# Patient Record
Sex: Male | Born: 1945 | Race: White | Hispanic: No | Marital: Married | Smoking: Current every day smoker
Health system: Southern US, Community
[De-identification: ages and names within clinical notes are randomized; demographics above are authoritative.]

## PROBLEM LIST (undated history)

## (undated) DIAGNOSIS — G8929 Other chronic pain: Secondary | ICD-10-CM

## (undated) DIAGNOSIS — K219 Gastro-esophageal reflux disease without esophagitis: Secondary | ICD-10-CM

## (undated) DIAGNOSIS — Z7901 Long term (current) use of anticoagulants: Secondary | ICD-10-CM

## (undated) DIAGNOSIS — Z923 Personal history of irradiation: Secondary | ICD-10-CM

## (undated) DIAGNOSIS — Y842 Radiological procedure and radiotherapy as the cause of abnormal reaction of the patient, or of later complication, without mention of misadventure at the time of the procedure: Secondary | ICD-10-CM

## (undated) DIAGNOSIS — C7951 Secondary malignant neoplasm of bone: Secondary | ICD-10-CM

## (undated) DIAGNOSIS — Z9221 Personal history of antineoplastic chemotherapy: Secondary | ICD-10-CM

## (undated) DIAGNOSIS — F329 Major depressive disorder, single episode, unspecified: Secondary | ICD-10-CM

## (undated) DIAGNOSIS — I10 Essential (primary) hypertension: Secondary | ICD-10-CM

## (undated) DIAGNOSIS — J439 Emphysema, unspecified: Secondary | ICD-10-CM

## (undated) DIAGNOSIS — C61 Malignant neoplasm of prostate: Secondary | ICD-10-CM

## (undated) DIAGNOSIS — Z85828 Personal history of other malignant neoplasm of skin: Secondary | ICD-10-CM

## (undated) DIAGNOSIS — E785 Hyperlipidemia, unspecified: Secondary | ICD-10-CM

## (undated) DIAGNOSIS — G510 Bell's palsy: Secondary | ICD-10-CM

## (undated) DIAGNOSIS — Z87442 Personal history of urinary calculi: Secondary | ICD-10-CM

## (undated) DIAGNOSIS — J069 Acute upper respiratory infection, unspecified: Secondary | ICD-10-CM

## (undated) DIAGNOSIS — C679 Malignant neoplasm of bladder, unspecified: Secondary | ICD-10-CM

## (undated) DIAGNOSIS — H60312 Diffuse otitis externa, left ear: Secondary | ICD-10-CM

## (undated) DIAGNOSIS — I2510 Atherosclerotic heart disease of native coronary artery without angina pectoris: Secondary | ICD-10-CM

## (undated) DIAGNOSIS — M8738 Other secondary osteonecrosis, other site: Secondary | ICD-10-CM

## (undated) DIAGNOSIS — I219 Acute myocardial infarction, unspecified: Secondary | ICD-10-CM

## (undated) DIAGNOSIS — T7840XA Allergy, unspecified, initial encounter: Secondary | ICD-10-CM

## (undated) DIAGNOSIS — Z5112 Encounter for antineoplastic immunotherapy: Secondary | ICD-10-CM

## (undated) DIAGNOSIS — C4490 Unspecified malignant neoplasm of skin, unspecified: Secondary | ICD-10-CM

## (undated) DIAGNOSIS — F32A Depression, unspecified: Secondary | ICD-10-CM

## (undated) DIAGNOSIS — Z8619 Personal history of other infectious and parasitic diseases: Secondary | ICD-10-CM

## (undated) DIAGNOSIS — C3490 Malignant neoplasm of unspecified part of unspecified bronchus or lung: Secondary | ICD-10-CM

## (undated) DIAGNOSIS — C07 Malignant neoplasm of parotid gland: Secondary | ICD-10-CM

## (undated) HISTORY — DX: Gastro-esophageal reflux disease without esophagitis: K21.9

## (undated) HISTORY — DX: Allergy, unspecified, initial encounter: T78.40XA

## (undated) HISTORY — PX: OTHER SURGICAL HISTORY: SHX169

## (undated) HISTORY — PX: EYE SURGERY: SHX253

## (undated) HISTORY — DX: Atherosclerotic heart disease of native coronary artery without angina pectoris: I25.10

## (undated) HISTORY — DX: Hyperlipidemia, unspecified: E78.5

## (undated) HISTORY — PX: EXCISIONAL HEMORRHOIDECTOMY: SHX1541

## (undated) HISTORY — DX: Personal history of other infectious and parasitic diseases: Z86.19

## (undated) HISTORY — PX: FOOT NEUROMA SURGERY: SHX646

## (undated) HISTORY — DX: Depression, unspecified: F32.A

## (undated) HISTORY — PX: SKIN CANCER EXCISION: SHX779

---
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
## 2002-03-14 ENCOUNTER — Encounter (HOSPITAL_BASED_OUTPATIENT_CLINIC_OR_DEPARTMENT_OTHER): Payer: Self-pay | Admitting: General Surgery

## 2002-03-15 ENCOUNTER — Ambulatory Visit (HOSPITAL_COMMUNITY): Admission: RE | Admit: 2002-03-15 | Discharge: 2002-03-15 | Payer: Self-pay | Admitting: General Surgery

## 2002-03-15 ENCOUNTER — Encounter (INDEPENDENT_AMBULATORY_CARE_PROVIDER_SITE_OTHER): Payer: Self-pay | Admitting: *Deleted

## 2002-03-15 HISTORY — PX: EXCISIONAL HEMORRHOIDECTOMY: SHX1541

## 2003-09-09 HISTORY — PX: FOOT NEUROMA SURGERY: SHX646

## 2005-07-25 ENCOUNTER — Encounter: Admission: RE | Admit: 2005-07-25 | Discharge: 2005-07-25 | Payer: Self-pay | Admitting: Family Medicine

## 2007-12-08 HISTORY — PX: CATARACT EXTRACTION W/ INTRAOCULAR LENS IMPLANT: SHX1309

## 2008-06-08 DIAGNOSIS — I219 Acute myocardial infarction, unspecified: Secondary | ICD-10-CM

## 2008-06-08 DIAGNOSIS — I252 Old myocardial infarction: Secondary | ICD-10-CM

## 2008-06-08 HISTORY — PX: CORONARY ANGIOPLASTY WITH STENT PLACEMENT: SHX49

## 2008-06-08 HISTORY — DX: Old myocardial infarction: I25.2

## 2008-06-08 HISTORY — DX: Acute myocardial infarction, unspecified: I21.9

## 2008-07-01 ENCOUNTER — Inpatient Hospital Stay (HOSPITAL_COMMUNITY): Admission: EM | Admit: 2008-07-01 | Discharge: 2008-07-05 | Payer: Self-pay | Admitting: Emergency Medicine

## 2008-07-03 HISTORY — PX: CORONARY ANGIOPLASTY WITH STENT PLACEMENT: SHX49

## 2008-07-11 HISTORY — PX: NM MYOCAR PERF WALL MOTION: HXRAD629

## 2008-07-25 ENCOUNTER — Encounter (HOSPITAL_COMMUNITY): Admission: RE | Admit: 2008-07-25 | Discharge: 2008-08-24 | Payer: Self-pay | Admitting: Family Medicine

## 2008-08-30 ENCOUNTER — Encounter (HOSPITAL_COMMUNITY): Admission: RE | Admit: 2008-08-30 | Discharge: 2008-09-29 | Payer: Self-pay | Admitting: Cardiovascular Disease

## 2008-09-08 HISTORY — PX: TRANSURETHRAL RESECTION OF BLADDER TUMOR WITH GYRUS (TURBT-GYRUS): SHX6458

## 2008-09-25 ENCOUNTER — Encounter (HOSPITAL_COMMUNITY): Admission: RE | Admit: 2008-09-25 | Discharge: 2008-10-25 | Payer: Self-pay | Admitting: Cardiovascular Disease

## 2008-10-09 DIAGNOSIS — Z8551 Personal history of malignant neoplasm of bladder: Secondary | ICD-10-CM

## 2008-10-09 DIAGNOSIS — C679 Malignant neoplasm of bladder, unspecified: Secondary | ICD-10-CM

## 2008-10-09 HISTORY — DX: Malignant neoplasm of bladder, unspecified: C67.9

## 2008-10-09 HISTORY — DX: Personal history of malignant neoplasm of bladder: Z85.51

## 2008-10-16 ENCOUNTER — Encounter (INDEPENDENT_AMBULATORY_CARE_PROVIDER_SITE_OTHER): Payer: Self-pay | Admitting: Urology

## 2008-10-16 ENCOUNTER — Ambulatory Visit (HOSPITAL_COMMUNITY): Admission: RE | Admit: 2008-10-16 | Discharge: 2008-10-16 | Payer: Self-pay | Admitting: Urology

## 2008-10-16 HISTORY — PX: TRANSURETHRAL RESECTION OF BLADDER TUMOR WITH GYRUS (TURBT-GYRUS): SHX6458

## 2008-10-17 ENCOUNTER — Emergency Department (HOSPITAL_COMMUNITY): Admission: EM | Admit: 2008-10-17 | Discharge: 2008-10-17 | Payer: Self-pay | Admitting: Emergency Medicine

## 2008-10-27 ENCOUNTER — Encounter (HOSPITAL_COMMUNITY): Admission: RE | Admit: 2008-10-27 | Discharge: 2008-11-26 | Payer: Self-pay | Admitting: Cardiovascular Disease

## 2009-10-11 ENCOUNTER — Encounter: Admission: RE | Admit: 2009-10-11 | Discharge: 2009-10-11 | Payer: Self-pay | Admitting: Otolaryngology

## 2009-11-02 ENCOUNTER — Ambulatory Visit (HOSPITAL_BASED_OUTPATIENT_CLINIC_OR_DEPARTMENT_OTHER): Admission: RE | Admit: 2009-11-02 | Discharge: 2009-11-03 | Payer: Self-pay | Admitting: Otolaryngology

## 2009-11-02 HISTORY — PX: SALIVARY GLAND SURGERY: SHX768

## 2009-12-07 DIAGNOSIS — C07 Malignant neoplasm of parotid gland: Secondary | ICD-10-CM

## 2009-12-07 HISTORY — DX: Malignant neoplasm of parotid gland: C07

## 2009-12-18 HISTORY — PX: PAROTIDECTOMY W/ NECK DISSECTION TOTAL: SUR1004

## 2010-04-24 ENCOUNTER — Ambulatory Visit: Payer: Self-pay | Admitting: Diagnostic Radiology

## 2010-04-24 ENCOUNTER — Encounter: Payer: Self-pay | Admitting: Emergency Medicine

## 2010-04-24 ENCOUNTER — Inpatient Hospital Stay (HOSPITAL_COMMUNITY): Admission: EM | Admit: 2010-04-24 | Discharge: 2010-04-28 | Payer: Self-pay | Admitting: Internal Medicine

## 2010-11-21 LAB — CBC
HCT: 33.9 % — ABNORMAL LOW (ref 39.0–52.0)
HCT: 34.6 % — ABNORMAL LOW (ref 39.0–52.0)
HCT: 36.7 % — ABNORMAL LOW (ref 39.0–52.0)
Hemoglobin: 12.8 g/dL — ABNORMAL LOW (ref 13.0–17.0)
MCH: 31.3 pg (ref 26.0–34.0)
MCHC: 35.5 g/dL (ref 30.0–36.0)
MCV: 90.1 fL (ref 78.0–100.0)
MCV: 90.3 fL (ref 78.0–100.0)
MCV: 92.2 fL (ref 78.0–100.0)
Platelets: 137 10*3/uL — ABNORMAL LOW (ref 150–400)
Platelets: 91 10*3/uL — ABNORMAL LOW (ref 150–400)
Platelets: 91 10*3/uL — ABNORMAL LOW (ref 150–400)
RBC: 3.74 MIL/uL — ABNORMAL LOW (ref 4.22–5.81)
RBC: 3.98 MIL/uL — ABNORMAL LOW (ref 4.22–5.81)
RDW: 13.6 % (ref 11.5–15.5)
RDW: 14 % (ref 11.5–15.5)
WBC: 8.8 10*3/uL (ref 4.0–10.5)

## 2010-11-21 LAB — COMPREHENSIVE METABOLIC PANEL
ALT: 142 U/L — ABNORMAL HIGH (ref 0–53)
ALT: 245 U/L — ABNORMAL HIGH (ref 0–53)
AST: 123 U/L — ABNORMAL HIGH (ref 0–37)
AST: 128 U/L — ABNORMAL HIGH (ref 0–37)
AST: 53 U/L — ABNORMAL HIGH (ref 0–37)
Albumin: 2.3 g/dL — ABNORMAL LOW (ref 3.5–5.2)
Albumin: 2.4 g/dL — ABNORMAL LOW (ref 3.5–5.2)
Alkaline Phosphatase: 198 U/L — ABNORMAL HIGH (ref 39–117)
Alkaline Phosphatase: 215 U/L — ABNORMAL HIGH (ref 39–117)
Alkaline Phosphatase: 256 U/L — ABNORMAL HIGH (ref 39–117)
BUN: 12 mg/dL (ref 6–23)
BUN: 15 mg/dL (ref 6–23)
BUN: 38 mg/dL — ABNORMAL HIGH (ref 6–23)
Calcium: 8.1 mg/dL — ABNORMAL LOW (ref 8.4–10.5)
Calcium: 8.1 mg/dL — ABNORMAL LOW (ref 8.4–10.5)
Chloride: 105 mEq/L (ref 96–112)
Creatinine, Ser: 1.57 mg/dL — ABNORMAL HIGH (ref 0.4–1.5)
GFR calc Af Amer: >60 mL/min (ref >60)
Glucose, Bld: 128 mg/dL — ABNORMAL HIGH (ref 70–99)
Glucose, Bld: 144 mg/dL — ABNORMAL HIGH (ref 70–99)
Potassium: 3.4 mEq/L — ABNORMAL LOW (ref 3.5–5.1)
Potassium: 3.5 mEq/L (ref 3.5–5.1)
Potassium: 3.6 mEq/L (ref 3.5–5.1)
Sodium: 138 mEq/L (ref 135–145)
Total Bilirubin: 1.3 mg/dL — ABNORMAL HIGH (ref 0.3–1.2)
Total Bilirubin: 1.9 mg/dL — ABNORMAL HIGH (ref 0.3–1.2)
Total Protein: 5 g/dL — ABNORMAL LOW (ref 6.0–8.3)
Total Protein: 5 g/dL — ABNORMAL LOW (ref 6.0–8.3)

## 2010-11-21 LAB — MRSA PCR SCREENING: MRSA by PCR: NEGATIVE

## 2010-11-21 LAB — BASIC METABOLIC PANEL
CO2: 22 mEq/L (ref 19–32)
Calcium: 8.1 mg/dL — ABNORMAL LOW (ref 8.4–10.5)
GFR calc Af Amer: >60 mL/min (ref >60)
Sodium: 133 mEq/L — ABNORMAL LOW (ref 135–145)

## 2010-11-21 LAB — CULTURE, BLOOD (ROUTINE X 2)

## 2010-11-21 LAB — URINE CULTURE
Colony Count: NO GROWTH
Culture  Setup Time: 201108182017

## 2010-11-21 LAB — HEPATITIS PANEL, ACUTE
HCV Ab: NEGATIVE
Hep A IgM: NEGATIVE
Hep B C IgM: NEGATIVE

## 2010-11-21 LAB — RHEUMATOID FACTOR: Rhuematoid fact SerPl-aCnc: <20 IU/mL (ref 0–20)

## 2010-11-21 LAB — CARDIAC PANEL(CRET KIN+CKTOT+MB+TROPI)
CK, MB: 0.8 ng/mL (ref 0.3–4.0)
Relative Index: INVALID (ref 0.0–2.5)
Relative Index: INVALID (ref 0.0–2.5)
Total CK: 41 U/L (ref 7–232)
Troponin I: 0.04 ng/mL (ref 0.00–0.06)

## 2010-11-21 LAB — MAGNESIUM
Magnesium: 1.6 mg/dL (ref 1.5–2.5)
Magnesium: 1.6 mg/dL (ref 1.5–2.5)

## 2010-11-21 LAB — PROTIME-INR: Prothrombin Time: 14.3 seconds (ref 11.6–15.2)

## 2010-11-21 LAB — CK TOTAL AND CKMB (NOT AT ARMC)
Relative Index: INVALID (ref 0.0–2.5)
Total CK: 39 U/L (ref 7–232)

## 2010-11-21 LAB — CORTISOL: Cortisol, Plasma: 10.6 ug/dL

## 2010-11-21 LAB — TRANSFERRIN: Transferrin: 142 mg/dL — ABNORMAL LOW (ref 212–360)

## 2010-11-22 LAB — COMPREHENSIVE METABOLIC PANEL
BUN: 46 mg/dL — ABNORMAL HIGH (ref 6–23)
CO2: 25 mEq/L (ref 19–32)
Calcium: 9.5 mg/dL (ref 8.4–10.5)
Chloride: 98 mEq/L (ref 96–112)
Creatinine, Ser: 2.1 mg/dL — ABNORMAL HIGH (ref 0.4–1.5)
GFR calc non Af Amer: 32 mL/min — ABNORMAL LOW (ref >60)
Glucose, Bld: 110 mg/dL — ABNORMAL HIGH (ref 70–99)
Total Bilirubin: 3.1 mg/dL — ABNORMAL HIGH (ref 0.3–1.2)

## 2010-11-22 LAB — URINE MICROSCOPIC-ADD ON

## 2010-11-22 LAB — DIFFERENTIAL
Basophils Absolute: 0 10*3/uL (ref 0.0–0.1)
Lymphocytes Relative: 1 % — ABNORMAL LOW (ref 12–46)
Monocytes Absolute: 0.4 10*3/uL (ref 0.1–1.0)
Monocytes Relative: 4 % (ref 3–12)
Neutro Abs: 8.2 10*3/uL — ABNORMAL HIGH (ref 1.7–7.7)
Neutrophils Relative %: 90 % — ABNORMAL HIGH (ref 43–77)

## 2010-11-22 LAB — URINALYSIS, ROUTINE W REFLEX MICROSCOPIC
Hgb urine dipstick: NEGATIVE
Nitrite: NEGATIVE
Specific Gravity, Urine: 1.027 (ref 1.005–1.030)
Urobilinogen, UA: 1 mg/dL (ref 0.0–1.0)
pH: 5 (ref 5.0–8.0)

## 2010-11-22 LAB — CBC
HCT: 39 % (ref 39.0–52.0)
Hemoglobin: 13.6 g/dL (ref 13.0–17.0)
WBC: 9 10*3/uL (ref 4.0–10.5)

## 2010-11-27 LAB — BASIC METABOLIC PANEL
BUN: 13 mg/dL (ref 6–23)
Calcium: 9.1 mg/dL (ref 8.4–10.5)
Creatinine, Ser: 1.05 mg/dL (ref 0.4–1.5)
GFR calc non Af Amer: >60 mL/min (ref >60)
Glucose, Bld: 87 mg/dL (ref 70–99)
Potassium: 4.9 mEq/L (ref 3.5–5.1)

## 2010-11-27 LAB — POCT HEMOGLOBIN-HEMACUE: Hemoglobin: 15.6 g/dL (ref 13.0–17.0)

## 2010-12-24 LAB — HEMOGLOBIN AND HEMATOCRIT, BLOOD: Hemoglobin: 15.7 g/dL (ref 13.0–17.0)

## 2010-12-24 LAB — URINALYSIS, ROUTINE W REFLEX MICROSCOPIC
Bilirubin Urine: NEGATIVE
Glucose, UA: NEGATIVE mg/dL
Ketones, ur: NEGATIVE mg/dL
Protein, ur: 100 mg/dL — AB
Urobilinogen, UA: 0.2 mg/dL (ref 0.0–1.0)

## 2010-12-24 LAB — URINE MICROSCOPIC-ADD ON

## 2010-12-24 LAB — BASIC METABOLIC PANEL
BUN: 13 mg/dL (ref 6–23)
Calcium: 9.2 mg/dL (ref 8.4–10.5)
Creatinine, Ser: 0.9 mg/dL (ref 0.4–1.5)
GFR calc non Af Amer: >60 mL/min (ref >60)
Glucose, Bld: 83 mg/dL (ref 70–99)
Potassium: 4.1 mEq/L (ref 3.5–5.1)

## 2011-01-21 NOTE — Cardiovascular Report (Signed)
NAMETREYSON, Kyle NO.:  [id number]   MEDICAL RECORD NO.:  [id number]          PATIENT TYPE:  INP   LOCATION:  2925                         FACILITY:  [facility]   PHYSICIAN:  Nanetta Batty, M.D.   DATE OF BIRTH:  1945-11-02   DATE OF PROCEDURE:  DATE OF DISCHARGE:                            CARDIAC CATHETERIZATION   Kyle Foster is a 65 year old married white male with positive risk  factors who recently was admitted for subendocardial infarct.  He  underwent colonoscopy and polypectomy on Thursday of last week,  infarcted on Friday and was placed on IV heparin and nitro over the  weekend where he remained asymptomatic.  He presents now for diagnostic  coronary angiography to define his anatomy and rule out ischemic  etiology.   DESCRIPTION OF PROCEDURE:  The patient was brought to the Second Floor  [facility] Cardiac Cath Lab in a postabsorptive state.  He was  premedicated with p.o. Valium and IV fentanyl.  His right groin was  prepped and shaved in the usual sterile fashion.  Xylocaine 1% was used  for local anesthesia.  A 6 upgrade to a 7-French sheath was inserted  into the right femoral artery using standard Seldinger technique.  A 6-  French left Judkins diagnostic catheter as well as [place] pigtail  catheter were used for selective cholangiography, ventriculography, sub-  selective left internal mammary artery angiography, and distal abdominal  aortography.  Visipaque dye was used for the entirety of the case.  Retrograde aortic, ventricular, and pullback pressures were recorded.   HEMODYNAMIC RESULTS:  1. Aortic systolic pressure 114, diastolic pressure 57.  2. Left ventricular systolic pressure 118 and end-diastolic pressure      8.   SELECTIVE CORONARY ANGIOGRAPHY:  1. Left main normal.  LIMA to the LAD normal.  2. Left circumflex; this is a infarct-related vessel and had a 99%      bifurcation lesion at the AV groove circumflex and OM-1.  3. Right  coronary artery; this was a dominant vessel with tandem 70%      lesions in the mid to distal portion.  4. Ventriculography; RAO left ventriculogram was performed using 20 mL      of Visipaque dye 10 mL per second in each view.  There were no      focal wall motion abnormalities.  The overall LVEF was estimated at      60%.  5. Left internal mammary artery; this vessel was sub-selectively      visualized and was widely patent.  It was suitable for use during      coronary bypass grafting if required.  6. Distal abdominal aortography; distal abdominal aortogram was      performed using of 20 mL of Visipaque dye at 20 mL per second.  The      renal arteries were widely patent.  Infrarenal abdominal aorta and      iliac bifurcation appeared free of significant atherosclerotic      changes.   IMPRESSION:  Kyle Foster has high-grade circumflex obtuse marginal  bifurcation disease.  We will proceed  with percutaneous coronary  intervention and stenting using bare metal stent given the fact that he  will require bladder surgery in the near future.   The patient had received aspirin, received at 6 mg of Plavix p.o.,  Pepcid I.V. 20 mg, and Angiomax bolus with an ACT of 291.   Using a 7-French XB 3.5 guide catheter along with 4190 Asahi soft wire  and Choice PT, angioplasty was performed.  The both Choice and Asahi  soft preferentially went down the first obtuse marginal artery and I  pass unable to pass the stenosis into the second obtuse marginal artery.  Ultimately, there was a proximal dissection with interruption of flow.  The patient remained electrographically and hemodynamically stable  during the case and denied chest pain.  I was able to get the Choice  wire back into first obtuse marginal artery and stent in the  arteriovenous groove circumflex into first obtuse marginal artery across  the continuation of the arteriovenous groove circumflex with 2.25 x 18  MicroDriver at 14-16  atmospheres (2.5 mm).  This resulted in excellent  apposition, TIMI III flow down both vessels.  The was obvious linear  dissection in the mid arteriovenous groove circumflex, just proximal to  the stent.  I was able to leave the Choice wire in the first obtuse  marginal artery and take the Asahi soft through a strut into the  continuation of the arteriovenous groove circumflex and dilate the strut  and the arteriovenous groove circumflex with a 2.0 x 15 Voyager.  I was  then able to pass a 2.25 x 18 MicroDriver through the previously placed  strut and facilitated T-stenting of the arteriovenous groove circumflex.  I then stented the midportion of the arteriovenous groove circumflex  proximal to the first stent, which had a linear dissection with 3.0 x 12  driver at 14 atmospheres (3.2 mm) with overlap of the first stent.  There was TIMI III flow at the end of the case.  There appeared to be  good stent apposition.  There was no obvious dissection.   IMPRESSION:  Successful percutaneous coronary intervention and stenting  of the arteriovenous groove circumflex, first obtuse marginal artery  bifurcation using provisional T-stenting, MicroDriver, bare metal  stents, and Angiomax.  The patient tolerated the procedure well.  The  guidewire was removed as was the guide catheter.  Sheath was then  secured in place.  The patient left lab in stable condition.  The sheath  will be removed in approximately 2-3 hours.  Angiomax will not be  restarted.  At this point, I am going to treat his residual moderate  disease in his right coronary artery medically.  He will require an  outpatient Myoview as well as aspirin, Plavix, and cardiac risk factor  modification including smoking cessation.  The patient left lab in  stable condition.      Nanetta Batty, M.D.  Electronically Signed     JB/MEDQ  D:  07/03/2008  T:  07/04/2008  Job:  [id number]   cc:   Second Floor [facility]   [facility]

## 2011-01-21 NOTE — H&P (Signed)
NAMELOPAKA, KARGE                ACCOUNT NO.:  [id number]   MEDICAL RECORD NO.:  [id number]          PATIENT TYPE:  INP   LOCATION:  2925                         FACILITY:  [facility]   PHYSICIAN:  Nanetta Batty, M.D.   DATE OF BIRTH:  1945/10/22   DATE OF ADMISSION:  06/30/2008  DATE OF DISCHARGE:                              HISTORY & PHYSICAL   CHIEF COMPLAINTS:  Chest pain.   HISTORY OF PRESENT ILLNESS:  [name] is a 65 year old male who is  followed at [facility].  He has a history of  dyslipidemia.  He has a history of hemorrhoids.  He had a colonoscopy  yesterday.  He had some vague pain that he describes as going from one  arm pit to the other yesterday evening, but it resolved spontaneously.  This evening while standing in his kitchen, he had midsternal chest  discomfort that was localized to the center of his chest.  He had no  associated nausea, vomiting, diaphoresis, shortness of breath or arm  pain.  His wife called his family doctor's office who suggested they  call EMS.  In the emergency room at [facility], his troponins in fact are  positive at 1.33 and 2.06 with a CK of 282 and MB of 20.  He is  currently pain free.  He has not had a history of coronary disease,  history of prior chest pain or shortness of breath.   PAST MEDICAL HISTORY:  1. Remarkable for dyslipidemia as noted, he is not taking simvastatin      as was prescribed in the past.  2. He has had previous vasectomy in 1987.  3. Hemorrhoid surgery in 2005.  4. Cataract surgery in his right eye February of this year.  5. He has had basal cell skin cancers removed.  6. Apparently recently he has been diagnosed with a bladder tumor and      was to see Dr. Isabel Caprice next week to have a cystoscopy and removal.   CURRENT MEDICATIONS:  None.   ALLERGIES:  NO KNOWN DRUG ALLERGIES.  He says he is intolerant to  CODEINE.   SOCIAL HISTORY:  He is married.  He has 3 children and 3 grandchildren.  He  is a nondrinker.  He does smoke a pack a day.  He retired this past  summer.   FAMILY HISTORY:  Remarkable that his mother had an MI in her 28s.  Father had a stroke at 39.   REVIEW OF SYSTEMS:  Remarkable for kidney stones.  He has had remote  peptic ulcer disease, but no recent melena.  There is no history of  diabetes or hypertension.   PHYSICAL EXAMINATION:  VITAL SIGNS:  Blood pressure 152/78, pulse 93,  temperature 98.7, respirations 12.  GENERAL:  He is a well-developed,  well-nourished male in no acute distress.  HEENT:  Normocephalic.  Extraocular movements are intact.  Sclerae  nonicteric.  NECK:  Without JVD or bruit.  Thyroid is not enlarged.  CHEST:  Clear to auscultation and percussion.  CARDIAC:  Reveals a regular rate and rhythm without  murmur or gallop.  Normal S1 and S2.  ABDOMEN:  Nontender.  No hepatosplenomegaly.  EXTREMITIES:  Without edema.  Distal pulses are 3+/4.  There are no  bruits noted.  NEURO:  Grossly intact.  He is awake, alert, oriented, cooperative.  Moves all extremities without obvious deficit.   LABORATORY DATA:  Sodium 141, potassium 3.9, BUN 13, creatinine 1.1,  troponin of 1.33 and a second troponin of 2.06, CK of 282 with an MB of  20.   DIAGNOSTICS:  1. Chest x-ray does indicate a right apical opacity which needs to be      followed up by CT scan at some point.  2. EKG shows sinus rhythm without acute changes.   IMPRESSION:  1. Non-ST elevation myocardial infarction, currently stable.  2. Dyslipidemia, the patient has not been taking statin.  3. History of smoking.  4. Abnormal chest x-ray with a right apical opacity which will require      follow up CT scan at some point.  5. Hemorrhoids status post colonoscopy yesterday.  6. Bladder tumor, followed by Dr. Isabel Caprice, the patient was to have a      cystoscopy and tumor removal next week.   PLAN:  The patient will be admitted to telemetry, started on IV heparin,  nitrates, beta  blocker, statin will be resumed.  We will also add low  dose ACE inhibitor.      Abelino Derrick, P.A.      Nanetta Batty, M.D.  Electronically Signed    LKK/MEDQ  D:  07/01/2008  T:  07/01/2008  Job:  [id number]

## 2011-01-21 NOTE — Op Note (Signed)
Kyle Foster, Kyle Foster                ACCOUNT NO.:  [id number]   MEDICAL RECORD NO.:  [id number]          PATIENT TYPE:  AMB   LOCATION:  DAY                          FACILITY:  [facility]   PHYSICIAN:  Valetta Fuller, M.D.  DATE OF BIRTH:  03-May-1946   DATE OF PROCEDURE:  10/16/2008  DATE OF DISCHARGE:                               OPERATIVE REPORT   PREOPERATIVE DIAGNOSIS:  Transitional cell carcinoma bladder.   POSTOPERATIVE DIAGNOSIS:  Transitional cell carcinoma bladder.   PROCEDURE PERFORMED:  1. Cystoscopy.  2. Transurethral resection of bladder tumor.  3. Installation of mitomycin.   SURGEON:  Valetta Fuller, M.D.   ANESTHESIA:  General.   INDICATIONS:  Kyle Foster is a 65 year old male.  He was seen and  evaluated in October 2009.  He had some persistent microscopic  hematuria.  The patient ended up having ultrasound which was  unremarkable.  Cystoscopic assessment revealed a relatively small tumor  involving the right hemi trigone just superior to the ureteral orifice.  We scheduled the patient at that time for transurethral resection of the  bladder tumor.  There were some ongoing cardiac issues and he had to be  delayed for several months.  We did not feel that that was a significant  issue.  The patient has subsequently been cleared by his cardiologist  and presents now for resection of the tumor.  He appears to understand  the advantages and disadvantages of the surgery.  We plan on mitomycin  installation post resection.   TECHNIQUE AND FINDINGS:  The patient underwent proper patient  identification steps.  He was brought to the operating room where he had  successful induction of general anesthesia.  He was placed in lithotomy  position and prepped and draped in the usual manner.  He received  perioperative ciprofloxacin.  Cystoscopic assessment revealed  unremarkable anterior urethra.  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  The bladder was  carefully panendoscope with 12 degree as well as 70 degree lens systems.  The patient was felt to have a solitary papillary appearing tumor of  approximately 2 cm size just superior to the right ureteral orifice on  the trigonal ridge somewhat lateral.  The cystoscope was removed and the  resectoscope was inserted.  The tumor was resected down to muscular  fibers.  No evidence of bladder perforation occurred.  The tumor  specimen was removed for pathologic assessment.  Again we were away from  the ureteral orifice and efflux urine could be seen.  The base of the  tumor resection was fulgurated and cauterized.  No active bleeding  occurred and again, there  was no evidence of bladder perforation.  Urine was clear.  A 16-French  Foley catheter was placed with 30 mg of mitomycin instilled which will  be left indwelling for approximately 45 minutes.  The patient appeared  to tolerate the procedure well and was brought to recovery room in  stable condition.      Valetta Fuller, M.D.  Electronically Signed     DSG/MEDQ  D:  10/16/2008  T:  10/16/2008  Job:  [id number]

## 2011-01-21 NOTE — Discharge Summary (Signed)
Kyle Foster, Kyle Foster                ACCOUNT NO.:  [id number]   MEDICAL RECORD NO.:  [id number]          PATIENT TYPE:  INP   LOCATION:  2028                         FACILITY:  [facility]   PHYSICIAN:  Nanetta Batty, M.D.   DATE OF BIRTH:  Sep 24, 1945   DATE OF ADMISSION:  06/30/2008  DATE OF DISCHARGE:  07/05/2008                               DISCHARGE SUMMARY   DISCHARGE DIAGNOSES:  1. Non-ST elevation myocardial infarction.  2. 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  3. Normal left ventricular function.  4. Hyperlipidemia.  5. History of bladder tumor per cystoscopy with Dr. Isabel Caprice as an      outpatient.  6. Recent polypectomy prior to admission without complications during      this admission.  7. Elevated cardiac enzymes after stent deployment.   HISTORY OF PRESENT ILLNESS:  A 65 year old white married male, who is  followed at [name].  He has a history of  hyperlipidemia, hemorrhoids, and a colonoscopy done June 29, 2008,  where he had some polypectomy by Dr. Elnoria Howard.  The patient came in the  emergency room on June 30, 2008, with some vague pain going from one  armpit to the other, the day prior to admission resolving spontaneously,  then on June 30, 2008, while standing in his kitchen, he admits  midsternal chest discomfort that was localized to center of his chest.  No associated nausea, vomiting, diaphoresis, shortness of breath, or arm  pain.  His wife called his family physician, who suggested that they  call EMS.  In the emergency room at [facility], his troponins were positive at  1.33 and then 2.06 with a positive CK-MB.  He was painfree.  No prior  history of coronary artery disease, chest pain, or shortness of breath.   PAST MEDICAL HISTORY:  Positive for dyslipidemia and  had not been taking  simvastatin, previous atherectomy in 87, hemorrhoid surgery in 2005,  cataract surgery in right eye in February 2009.  He has had basal cell  skin cancers removed from his face and recent polypectomy on June 29, 2008, by Dr. Jeani Hawking and also diagnosed with a bladder tumor, and  wants to see Dr. Isabel Caprice next week to have a cysto and removal.   ALLERGIES:  Intolerant to CODEINE.   PRIOR MEDICINES:  None.   Social history, family history, and review of systems see H and P.   PHYSICAL EXAMINATION AT DISCHARGE:  VITAL SIGNS:  Blood pressure 110/70,  pulse 75, respirations 20, temperature 97.4, oxygen saturation 97% on  room air.  HEART:  S1 and S2.  Regular rate and rhythm.  LUNGS:  Clear.  ABDOMEN:  Soft and nontender.  Positive bowel sounds.  Right groin  stable.  Dressing removed.  No hematoma.   LABORATORY DATA:  Admitting hemoglobin 14.9, hematocrit 43.7, WBC 9.8,  platelets 147 and these remained  stable; and prior to discharge,  hemoglobin 14, hematocrit 40.3, WBC 10, and platelets 147.   CHEMISTRY ON ADMISSION:  Sodium 142, potassium 3.7, chloride 110, CO2  24, glucose 96, BUN 10, creatinine 0.91, total bili 1, alkaline phos 50,  SGOT 45, SGPT 23, total protein 5.9, albumin 3.6; and prior to  discharge, sodium 140, potassium 3.7, chloride 106, CO2 27, glucose 104,  BUN 12, creatinine was 1.02, and calcium 9.1.   TSH was 1.884.   Lipid panel, total cholesterol 155, triglycerides 56, HDL 35, LDL 108.  Magnesium level was 2.   Urinalysis was essentially clear.  Small amount of blood, 3-6 rbcs'.  Initial cardiac markers, CK-MB was 20, troponin I 1.33, and CK-MB was  282/20.2 with a relative index of 7.2 and troponin I was 2.06.   Followup markers, CK 248, MB 17, and troponin 3.07.  CK later that day  was 177 with an MB of 11.9 and it continued to come down.  CK 130, MB of  7.1, and troponin I 2.53.   Postprocedure cardiac enzymes were elevated  with a CK of 124, MB of 5.6,  and troponin I 2.39.   On the morning of July 04, 2008, post procedure CK 373, MB 46,  troponin I 3.49 and followup was 427 with an MB of 45, troponin 4.79.   RADIOLOGY:  Chest x-ray, no definite acute cardiopulmonary disease, mild  emphysema, vague opacity in the right apex overlying the first rib and  clavicle.  Followup chest CT recommended.  Because of needing stent in  the face of a non-ST elevation MI, we did have the patient undergo CT of  the chest without contrast.  Vague opacity of the right apex was  artifactual.  He has mild central lobular and paraseptal emphysema.  No  acute disease of the chest.  A 2 mm right upper pole renal collecting  system calculus.   EKGs on admission:  Sinus rhythm, left axis deviation, no acute ST  elevation.  Followup EKGs, sinus rhythm, incomplete right bundle-branch  block.  Postcath EKG, sinus brady, heart rate of 48 with an inferior  infarction with T-wave inversions in II, III, and aVF, and laterally V6.  On July 04, 2008, EKG shows left axis deviation, incomplete right  bundle-branch block, and T-waves now baseline in II, III, and aVF as  well as V6 and lead I.   Cardiac catheterization was done on July 03, 2008, by Dr. Allyson Sabal.  The  patient had up to 99% stenosis of the circumflex, bifurcated lesion at  the AV groove and OM1.  He also had 70% RCA lesion, left main was  normal, and the LAD was patent as well as the patient's internal mammary  artery.   The patient underwent PTCA and stent deployment with bare metal stents  with a total of 4 stents, 3 to the circumflex and 1 to the second OM.  Dr. Allyson Sabal was going to post cath concerning the 70% RCA stenosis, was  going to treat that medically and he will need outpatient Myoview in the  future.   HOSPITAL COURSE:  [name] was admitted in the evening of June 30, 2008, after presenting to the emergency room with chest pain.  He was  seen and  admitted with positive CK-MB and troponin I.  He was placed on  IV heparin, IV nitroglycerin and admitted to the step-down unit.  Cardiac enzymes continued to rise.  The patient was stable.  No  further  chest pain.  He was not placed on Integrilin despite positive enzymes  secondary to recent polypectomy the day prior to admission.  The patient  did well over the weekend by Monday, July 03, 2008.  Plans were for  cardiac catheterization.  We did call Dr. Elnoria Howard and discussed with him  anticoagulation.  There were small polyps that were removed.  He felt  comfortable saying to proceed with whatever we needed to including  Plavix and 2b3a agents.   The patient underwent PTCA and stent deployment quite complex procedure  with significant disease at bifurcation.  Postprocedure cardiac enzymes  did rise.  He had no further chest pain and by the morning of July 04, 2008, his EKG was actually improved from post procedure.   The patient was transferred to telemetry unit, ambulated without  problems.  No chest pain at all and on July 04, 2008, he was seen and  discharged home.  He will get outpatient Myoview.  The office will call  to arrange that.   Additionally, the patient had been scheduled for a cysto with Dr.  Isabel Caprice.  Dr. Ellin Goodie partner, Dr. Annabell Howells was notified over the weekend  to cancel that procedure for July 03, 2008, and the patient has been  instructed to follow up with Dr. Isabel Caprice in approximately 3 weeks.  He  has non-drug-eluting stents, so the plan would be he could undergo  procedure after 30 days, but he will see Dr. Allyson Sabal back prior to that  time for further discussion as well as we will get a Myoview prior to  the procedure.   DISCHARGE MEDICATIONS:  1. Zocor 80 mg every evening.  2. Protonix 40 mg daily.  3. Altace 5 mg daily.  4. Lopressor 50 mg tablet, half a tablet twice a day.  5. Aspirin 325 mg daily for at least 1 month.  We may decrease the      dose  after 1 month.  6. Plavix 75 mg one daily, do not stop for at least 30 days.  Stopping      could cause a heart attack.  7. Nitroglycerin 1/150 sublingual as needed for chest pain, one every      5 minutes up to three over 15 minutes.  Call EMS at that point.   DISCHARGE INSTRUCTIONS:  1. If any blood in stools or urine, please call Dr. Hazle Coca office as      well as appropriate physician Dr. Elnoria Howard for GI or Dr. Isabel Caprice for      Urology.  2. Low-sodium, heart-healthy diet.  3. Wash cath site with soap and water.  Call if any bleeding,      swelling, or drainage.  4. Increase activity slowly.  5. He may shower.  No lifting for 1 week.  No driving for 1 week.  6. Follow up with Dr. Allyson Sabal on Wednesday, July 19, 2008, at 2:00      p.m.  7. Follow up with Dr. Isabel Caprice in 3 weeks.  Additionally, our office      will call to schedule an outpatient Persantine Myoview prior to      seeing Dr. Allyson Sabal back.      Darcella Gasman. Annie Paras, N.P.      Nanetta Batty, M.D.  Electronically Signed    LRI/MEDQ  D:  07/05/2008  T:  07/06/2008  Job:  [id number]

## 2011-01-24 NOTE — Op Note (Signed)
[facility]  Patient:    Kyle Foster, Kyle Foster Visit Number: [id number] MRN: [id number]          Service Type: DSU Location: [facility] 2550 01 Attending Physician:  Sonda Primes Dictated by:   Mardene Celeste. Lurene Shadow, M.D. Proc. Date: 03/15/02 Admit Date:  03/15/2002 Discharge Date: 03/15/2002                             Operative Report  PREOPERATIVE DIAGNOSIS:  Stage III and IV hemorrhoidal disease.  POSTOPERATIVE DIAGNOSIS:  Stage III and IV hemorrhoidal disease.  OPERATION PERFORMED:  Examination under anesthesia with proctosigmoidoscopy to 25 cm and hemorrhoidectomy.  SURGEON:  Mardene Celeste. Lurene Shadow, M.D.  ASSISTANT:  Magnus Ivan, RNFA  ANESTHESIA:  General.  INDICATIONS FOR PROCEDURE:  The patient is a 65 year old man presenting with painful, inflamed and prolapsing hemorrhoidal disease which has been continuous for months now.  He is brought to the operating room after the risks and potential benefits of hemorrhoidectomy were discussed and he gives his consent for surgery.  DESCRIPTION OF PROCEDURE:  Following the induction of satisfactory general anesthesia with the patient positioned supinely and then turned into the jackknife prone position, the perianal tissues were prepped and draped to be included in a sterile operative field after proctosigmoidoscopy to 25 cm was carried out and no mucosal lesions except for the hemorrhoids were noted.  A perianal block was carried out with 0.5% Marcaine with epinephrine 1:200,000. Two very large hemorrhoids one located at 2 oclock position and one extending from the 8 to 10 oclock position on the anal verge were both injected with Marcaine with epinephrine.  Elliptical incision made through the mucosa and continued out onto the mucocutaneous junction.  The hemorrhoid was then dissected free from the underlying sphincter muscle, removed in its entirety and forwarded for pathologic evaluation.   Hemostasis obtained with electrocautery.  The mucosa was then closed with a running 2-0 chromic catgut suture.  Similarly the hemorrhoid at the 2 oclock position was injected with Marcaine with epinephrine excising an elliptical incision and removed it for pathologic evaluation.  The mucosa was then closed with a running 2-0 chromic catgut suture.  All areas of dissection were then checked for hemostasis and noted to be dry.  Sponge, instrument and sharp counts were verified. I used two Gelfoam pledgets placed over the incisions and sterile dressings were then applied.  Anesthetic reversed.  Patient removed from the operating room to the recovery room in stable condition having tolerated the procedure well. Dictated by:   Mardene Celeste. Lurene Shadow, M.D. Attending Physician:  Sonda Primes DD:  03/15/02 TD:  03/15/02 Job: [id number]

## 2011-04-09 HISTORY — PX: SALIVARY GLAND SURGERY: SHX768

## 2011-04-09 HISTORY — PX: EYE SURGERY: SHX253

## 2011-06-09 LAB — BASIC METABOLIC PANEL
BUN: 13
CO2: 26
CO2: 26
CO2: 27
Calcium: 9
Calcium: 9
Calcium: 9.1
Calcium: 9.1
Chloride: 106
Creatinine, Ser: 0.99
GFR calc Af Amer: >60
GFR calc Af Amer: >60
GFR calc non Af Amer: >60
GFR calc non Af Amer: >60
GFR calc non Af Amer: >60
Glucose, Bld: 100 — ABNORMAL HIGH
Glucose, Bld: 100 — ABNORMAL HIGH
Glucose, Bld: 91
Potassium: 3.7
Potassium: 4
Sodium: 139
Sodium: 139
Sodium: 140

## 2011-06-09 LAB — POCT CARDIAC MARKERS: Troponin i, poc: 1.33

## 2011-06-09 LAB — COMPREHENSIVE METABOLIC PANEL
AST: 45 — ABNORMAL HIGH
Albumin: 3.6
Calcium: 9.1
Chloride: 110
Creatinine, Ser: 0.91
GFR calc Af Amer: >60
Total Protein: 5.9 — ABNORMAL LOW

## 2011-06-09 LAB — LIPID PANEL
Cholesterol: 154
HDL: 35 — ABNORMAL LOW
Total CHOL/HDL Ratio: 4.4
Triglycerides: 56

## 2011-06-09 LAB — CBC
HCT: 40.3
HCT: 40.6
HCT: 42.4
HCT: 43.7
HCT: 44.5
HCT: 44.5
Hemoglobin: 14
Hemoglobin: 14.2
Hemoglobin: 14.9
Hemoglobin: 14.9
MCHC: 34.1
MCHC: 34.2
MCHC: 34.8
MCHC: 35
MCHC: 35
MCV: 94.1
MCV: 94.3
MCV: 96.4
Platelets: 135 — ABNORMAL LOW
Platelets: 140 — ABNORMAL LOW
RBC: 4.28
RBC: 4.31
RBC: 4.56
RBC: 4.62
RDW: 13
RDW: 13.1
RDW: 13.3
WBC: 7.4

## 2011-06-09 LAB — POCT I-STAT, CHEM 8
Chloride: 109
Creatinine, Ser: 1.1
Glucose, Bld: 94
Hemoglobin: 15
Potassium: 3.9
Sodium: 141

## 2011-06-09 LAB — URINALYSIS, ROUTINE W REFLEX MICROSCOPIC
Nitrite: NEGATIVE
Protein, ur: NEGATIVE
Specific Gravity, Urine: 1.028
Urobilinogen, UA: 0.2

## 2011-06-09 LAB — DIFFERENTIAL
Basophils Relative: 1
Eosinophils Relative: 1
Lymphocytes Relative: 19
Monocytes Absolute: 0.9
Monocytes Relative: 10
Neutro Abs: 6.8

## 2011-06-09 LAB — CARDIAC PANEL(CRET KIN+CKTOT+MB+TROPI)
CK, MB: 2
CK, MB: 23.1 — ABNORMAL HIGH
CK, MB: 3
CK, MB: 5.6 — ABNORMAL HIGH
CK, MB: 7.1 — ABNORMAL HIGH
Relative Index: 2.3
Relative Index: 4.5 — ABNORMAL HIGH
Relative Index: 5.5 — ABNORMAL HIGH
Relative Index: INVALID
Total CK: 124
Total CK: 131
Total CK: 215
Total CK: 70
Total CK: 73
Troponin I: 1.82
Troponin I: 2.39
Troponin I: 2.53

## 2011-06-09 LAB — PROTIME-INR
INR: 0.9
INR: 1
Prothrombin Time: 12.4

## 2011-06-09 LAB — CK TOTAL AND CKMB (NOT AT ARMC)
CK, MB: 17.7 — ABNORMAL HIGH
Relative Index: 6.7 — ABNORMAL HIGH
Relative Index: 7.2 — ABNORMAL HIGH
Total CK: 248 — ABNORMAL HIGH
Total CK: 282 — ABNORMAL HIGH

## 2011-06-09 LAB — HEPARIN LEVEL (UNFRACTIONATED)
Heparin Unfractionated: 0.66
Heparin Unfractionated: 0.73 — ABNORMAL HIGH

## 2011-06-09 LAB — URINE MICROSCOPIC-ADD ON

## 2011-06-09 LAB — TROPONIN I: Troponin I: 2.06

## 2011-06-09 LAB — TSH: TSH: 1.884

## 2011-06-16 HISTORY — PX: SURGERY OF LIP: SUR1315

## 2011-06-25 DIAGNOSIS — C07 Malignant neoplasm of parotid gland: Secondary | ICD-10-CM | POA: Insufficient documentation

## 2011-08-14 ENCOUNTER — Other Ambulatory Visit: Payer: Self-pay | Admitting: Urology

## 2011-09-04 ENCOUNTER — Encounter (HOSPITAL_COMMUNITY): Payer: Self-pay | Admitting: Pharmacy Technician

## 2011-09-15 ENCOUNTER — Encounter (HOSPITAL_COMMUNITY): Payer: Self-pay

## 2011-09-15 ENCOUNTER — Encounter (HOSPITAL_COMMUNITY)
Admission: RE | Admit: 2011-09-15 | Discharge: 2011-09-15 | Disposition: A | Discharge status: Home / Self Care | Payer: BC Managed Care – PPO | Source: Ambulatory Visit | Attending: Urology | Admitting: Urology

## 2011-09-15 ENCOUNTER — Ambulatory Visit (HOSPITAL_COMMUNITY)
Admission: RE | Admit: 2011-09-15 | Discharge: 2011-09-15 | Disposition: A | Discharge status: Home / Self Care | Payer: BC Managed Care – PPO | Source: Ambulatory Visit | Attending: Urology | Admitting: Urology

## 2011-09-15 DIAGNOSIS — Z01818 Encounter for other preprocedural examination: Secondary | ICD-10-CM | POA: Insufficient documentation

## 2011-09-15 DIAGNOSIS — Z01812 Encounter for preprocedural laboratory examination: Secondary | ICD-10-CM | POA: Insufficient documentation

## 2011-09-15 HISTORY — DX: Acute upper respiratory infection, unspecified: J06.9

## 2011-09-15 HISTORY — DX: Acute myocardial infarction, unspecified: I21.9

## 2011-09-15 HISTORY — DX: Essential (primary) hypertension: I10

## 2011-09-15 LAB — BASIC METABOLIC PANEL
BUN: 19 mg/dL (ref 6–23)
Chloride: 103 mEq/L (ref 96–112)
GFR calc non Af Amer: 71 mL/min — ABNORMAL LOW (ref >90)
Glucose, Bld: 88 mg/dL (ref 70–99)
Potassium: 5.3 mEq/L — ABNORMAL HIGH (ref 3.5–5.1)

## 2011-09-15 LAB — CBC
HCT: 40.8 % (ref 39.0–52.0)
Hemoglobin: 13.9 g/dL (ref 13.0–17.0)
MCHC: 34.1 g/dL (ref 30.0–36.0)
MCV: 93.2 fL (ref 78.0–100.0)

## 2011-09-15 LAB — SURGICAL PCR SCREEN: Staphylococcus aureus: NEGATIVE

## 2011-09-15 NOTE — Pre-Procedure Instructions (Signed)
07/24/11 EKG and LOV note cardiology in chart  Echo 04/26/10 in chart  07/11/2008 Stress test in chart

## 2011-09-15 NOTE — Patient Instructions (Signed)
21 Kyle Foster  09/15/2011   Your procedure is scheduled on:  09/22/11 1230pm-200 pm  Report to [facility] at 1030 AM.  Call this number if you have problems the morning of surgery: [phone]   Remember:   Do not eat food:After Midnight.  May have clear liquids:until Midnight .  Clear liquids include soda, tea, black coffee, apple or grape juice, broth.  Take these medicines the morning of surgery with A SIP OF WATER:    Do not wear jewelry,   Do not wear lotions, powders, or perfumes.    Do not bring valuables to the hospital.  Contacts, dentures or bridgework may not be worn into surgery.     Patients discharged the day of surgery will not be allowed to drive home.  Name and phone number of your driver:  Special Instructions: CHG Shower Use Special Wash: 1/2 bottle night before surgery and 1/2 bottle morning of surgery. Shower chin to toes with CHG.   Wash face and private parts with regular soap.     Please read over the following fact sheets that you were given: MRSA Information, coughing and deep breathing exercises, leg exercises

## 2011-09-22 ENCOUNTER — Ambulatory Visit (HOSPITAL_COMMUNITY): Payer: BC Managed Care – PPO | Admitting: Anesthesiology

## 2011-09-22 ENCOUNTER — Encounter (HOSPITAL_COMMUNITY): Payer: Self-pay | Admitting: *Deleted

## 2011-09-22 ENCOUNTER — Encounter (HOSPITAL_COMMUNITY): Payer: Self-pay | Admitting: Anesthesiology

## 2011-09-22 ENCOUNTER — Other Ambulatory Visit: Payer: Self-pay | Admitting: Urology

## 2011-09-22 ENCOUNTER — Ambulatory Visit (HOSPITAL_COMMUNITY)
Admission: RE | Admit: 2011-09-22 | Discharge: 2011-09-22 | Disposition: A | Discharge status: Home / Self Care | Payer: BC Managed Care – PPO | Source: Ambulatory Visit | Attending: Urology | Admitting: Urology

## 2011-09-22 ENCOUNTER — Encounter (HOSPITAL_COMMUNITY)
Admission: RE | Disposition: A | Discharge status: Home / Self Care | Payer: Self-pay | Source: Ambulatory Visit | Attending: Urology

## 2011-09-22 DIAGNOSIS — Z8551 Personal history of malignant neoplasm of bladder: Secondary | ICD-10-CM | POA: Insufficient documentation

## 2011-09-22 DIAGNOSIS — C689 Malignant neoplasm of urinary organ, unspecified: Secondary | ICD-10-CM | POA: Diagnosis present

## 2011-09-22 DIAGNOSIS — N3030 Trigonitis without hematuria: Secondary | ICD-10-CM | POA: Insufficient documentation

## 2011-09-22 DIAGNOSIS — N189 Chronic kidney disease, unspecified: Secondary | ICD-10-CM | POA: Insufficient documentation

## 2011-09-22 DIAGNOSIS — Z79899 Other long term (current) drug therapy: Secondary | ICD-10-CM | POA: Insufficient documentation

## 2011-09-22 DIAGNOSIS — I252 Old myocardial infarction: Secondary | ICD-10-CM | POA: Insufficient documentation

## 2011-09-22 DIAGNOSIS — R350 Frequency of micturition: Secondary | ICD-10-CM | POA: Insufficient documentation

## 2011-09-22 DIAGNOSIS — R300 Dysuria: Secondary | ICD-10-CM | POA: Insufficient documentation

## 2011-09-22 DIAGNOSIS — I129 Hypertensive chronic kidney disease with stage 1 through stage 4 chronic kidney disease, or unspecified chronic kidney disease: Secondary | ICD-10-CM | POA: Insufficient documentation

## 2011-09-22 DIAGNOSIS — R3915 Urgency of urination: Secondary | ICD-10-CM | POA: Insufficient documentation

## 2011-09-22 HISTORY — PX: CYSTOSCOPY W/ RETROGRADES: SHX1426

## 2011-09-22 HISTORY — PX: CYSTOSCOPY WITH BIOPSY: SHX5122

## 2011-09-22 SURGERY — CYSTOSCOPY, WITH RETROGRADE PYELOGRAM
Anesthesia: General

## 2011-09-22 MED ORDER — LIDOCAINE HCL 2 % EX GEL
CUTANEOUS | Status: DC | PRN
  Administered 2011-09-22: 1

## 2011-09-22 MED ORDER — PROPOFOL 10 MG/ML IV EMUL
INTRAVENOUS | Status: DC | PRN
  Administered 2011-09-22: 150 mg via INTRAVENOUS

## 2011-09-22 MED ORDER — LACTATED RINGERS IV SOLN: INTRAVENOUS | Status: DC

## 2011-09-22 MED ORDER — FENTANYL CITRATE 0.05 MG/ML IJ SOLN
INTRAMUSCULAR | Status: DC | PRN
  Administered 2011-09-22: 25 ug via INTRAVENOUS
  Administered 2011-09-22: 50 ug via INTRAVENOUS

## 2011-09-22 MED ORDER — CIPROFLOXACIN IN D5W 400 MG/200ML IV SOLN
400.0000 mg | INTRAVENOUS | Status: AC
  Administered 2011-09-22: 400 mg via INTRAVENOUS

## 2011-09-22 MED ORDER — PROMETHAZINE HCL 25 MG/ML IJ SOLN: 6.2500 mg | INTRAMUSCULAR | Status: DC | PRN

## 2011-09-22 MED ORDER — SODIUM CHLORIDE 0.9 % IR SOLN
Status: DC | PRN
  Administered 2011-09-22: 1000 mL

## 2011-09-22 MED ORDER — LIDOCAINE HCL 2 % EX GEL
CUTANEOUS | Status: AC
  Filled 2011-09-22: qty 10

## 2011-09-22 MED ORDER — LACTATED RINGERS IV SOLN
INTRAVENOUS | Status: DC | PRN
  Administered 2011-09-22: 12:00:00 via INTRAVENOUS

## 2011-09-22 MED ORDER — STERILE WATER FOR IRRIGATION IR SOLN
Status: DC | PRN
  Administered 2011-09-22: 3000 mL

## 2011-09-22 MED ORDER — FENTANYL CITRATE 0.05 MG/ML IJ SOLN: 25.0000 ug | INTRAMUSCULAR | Status: DC | PRN

## 2011-09-22 MED ORDER — MIDAZOLAM HCL 5 MG/5ML IJ SOLN
INTRAMUSCULAR | Status: DC | PRN
  Administered 2011-09-22: 2 mg via INTRAVENOUS

## 2011-09-22 MED ORDER — CIPROFLOXACIN IN D5W 400 MG/200ML IV SOLN
INTRAVENOUS | Status: AC
  Filled 2011-09-22: qty 200

## 2011-09-22 MED ORDER — IOHEXOL 300 MG/ML  SOLN
INTRAMUSCULAR | Status: AC
  Filled 2011-09-22: qty 1

## 2011-09-22 SURGICAL SUPPLY — 25 items
ADAPTER CATH URET PLST 4-6FR (CATHETERS) ×3 IMPLANT
ADPR CATH URET STRL DISP 4-6FR (CATHETERS) ×2
BAG URO CATCHER STRL LF (DRAPE) ×3 IMPLANT
BASKET ZERO TIP NITINOL 2.4FR (BASKET) IMPLANT
BSKT STON RTRVL ZERO TP 2.4FR (BASKET)
CATH INTERMIT  6FR 70CM (CATHETERS) IMPLANT
CATH ROBINSON RED A/P 16FR (CATHETERS) ×2 IMPLANT
CATH URET 5FR 28IN CONE TIP (BALLOONS)
CATH URET 5FR 70CM CONE TIP (BALLOONS) IMPLANT
CLOTH BEACON ORANGE TIMEOUT ST (SAFETY) ×3 IMPLANT
DRAPE CAMERA CLOSED 9X96 (DRAPES) ×3 IMPLANT
DRESSING TELFA 8X3 (GAUZE/BANDAGES/DRESSINGS) ×2 IMPLANT
ELECT REM PT RETURN 9FT ADLT (ELECTROSURGICAL) ×3
ELECTRODE REM PT RTRN 9FT ADLT (ELECTROSURGICAL) ×2 IMPLANT
GLOVE BIOGEL M STRL SZ7.5 (GLOVE) ×3 IMPLANT
GOWN PREVENTION PLUS XLARGE (GOWN DISPOSABLE) ×3 IMPLANT
GOWN STRL NON-REIN LRG LVL3 (GOWN DISPOSABLE) ×3 IMPLANT
GOWN STRL REIN XL XLG (GOWN DISPOSABLE) ×3 IMPLANT
GUIDEWIRE STR DUAL SENSOR (WIRE) ×2 IMPLANT
MANIFOLD NEPTUNE II (INSTRUMENTS) ×3 IMPLANT
NDL SAFETY ECLIPSE 18X1.5 (NEEDLE) ×2 IMPLANT
NEEDLE HYPO 18GX1.5 SHARP (NEEDLE)
PACK CYSTO (CUSTOM PROCEDURE TRAY) ×3 IMPLANT
TUBING CONNECTING 10 (TUBING) ×3 IMPLANT
WIRE COONS/BENSON .038X145CM (WIRE) ×3 IMPLANT

## 2011-09-22 NOTE — Anesthesia Preprocedure Evaluation (Addendum)
Anesthesia Evaluation  Patient identified by MRN, date of birth, ID band Patient awake    Reviewed: Allergy & Precautions, H&P , NPO status , Patient's Chart, lab work & pertinent test results, reviewed documented beta blocker date and time   Airway Mallampati: II TM Distance: >3 FB Neck ROM: full    Dental No notable dental hx. (+) Teeth Intact and Dental Advisory Given   Pulmonary neg pulmonary ROS, Recent URI ,  clear to auscultation  Pulmonary exam normal       Cardiovascular Exercise Tolerance: Good hypertension, On Home Beta Blockers + Past MI and neg cardio ROS regular Normal    Neuro/Psych Negative Neurological ROS  Negative Psych ROS   GI/Hepatic negative GI ROS, Neg liver ROS,   Endo/Other  Negative Endocrine ROS  Renal/GU negative Renal ROS  Genitourinary negative   Musculoskeletal   Abdominal   Peds  Hematology negative hematology ROS (+)   Anesthesia Other Findings   Reproductive/Obstetrics negative OB ROS                          Anesthesia Physical Anesthesia Plan  ASA: III  Anesthesia Plan: General   Post-op Pain Management:    Induction: Intravenous  Airway Management Planned: LMA  Additional Equipment:   Intra-op Plan:   Post-operative Plan:   Informed Consent: I have reviewed the patients History and Physical, chart, labs and discussed the procedure including the risks, benefits and alternatives for the proposed anesthesia with the patient or authorized representative who has indicated his/her understanding and acceptance.   Dental Advisory Given  Plan Discussed with: CRNA and Surgeon  Anesthesia Plan Comments:         Anesthesia Quick Evaluation

## 2011-09-22 NOTE — Interval H&P Note (Signed)
History and Physical Interval Note:  09/22/2011 11:50 AM  Kyle Foster  has presented today for surgery, with the diagnosis of History of Bladder Cancer, Positive Urine Cytology  The various methods of treatment have been discussed with the patient and family. After consideration of risks, benefits and other options for treatment, the patient has consented to  Procedure(s): CYSTOSCOPY WITH RETROGRADE PYELOGRAM CYSTOSCOPY WITH BIOPSY as a surgical intervention .  The patients' history has been reviewed, patient examined, no change in status, stable for surgery.  I have reviewed the patients' chart and labs.  Questions were answered to the patient's satisfaction.     Skylynn Burkley S

## 2011-09-22 NOTE — H&P (Signed)
Urology Admission H&P  Chief Complaint:Here for cytoscopy with B RPGs and possible Bx.  History of Present Illness: Reason For Visit   The patient was in to see me in March of 2012 with a history as summarized below.  At that time cystoscopy was unremarkable.  Urine cytology, however, showed some atypical cells.  There was not obvious malignancy, but there were some, again, atypical cells that were suspicious.  For that reason we went ahead and obtained a CT scan via hematuria protocol.  This did not show any evidence of upper tract transitional cell carcinoma, nor was there any obvious bladder findings.  The patient had a 5 mm nonobstructing stone in the upper pole of the right kidney.  NMP-22 testing was, however, positive. He is now here for repeat cystoscopy and cytology/ reflex FISH testing.    History of Present Illness          Past Gu Hx:              Mr. 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  His initial surgery had to be delayed because of an acute MI.  The patient was taken to surgery in  February of 2010.  At that time, a small tumor was noted.  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  The patient did have a complication of some urinary retention after the procedure.  That problem subsequently resolved.  The patient did have an instillation of mitomycin at the time of his procedure.  Clinically, he has done well. He does have a history of nephrolithiasis( 3X NO SURGICAL INTERVENTION), but nothing recently.  The patient also has erectile dysfunction for which he was taking Viagra, but that has been discontinued because of p.r.n. use of nitroglycerin. Not interested in treating ED at this time.  He is satisfied with his voiding.  He has had PSA testing within the last year and has not had any other complaints or problems at this time.  Urine today ok.   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   Still smoking but down to 5-6/ day.     Follow-up: March of 2011 with negative cystoscopy and Cytology.               Follow-up: July of 2011 with negative cystoscopy and cytology.        Follow up November of 2011 with negative cystscopy and NMP-22   Past Medical History  Diagnosis Date  . Myocardial infarction     06/2008  . Hypertension   . Recurrent upper respiratory infection (URI)     09/01/11 saw PCP - Benedetto Goad , antibiotic  and prednisone   . Chronic kidney disease     hx of bladder cancer 2/10 , kidney stones   . 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 Eye surgery     12/2007 right eye cataract surgery   . Coronary angioplasty     coronary stents 06/2008   . Other surgical history     parotid gland surgery and left eye reconstructive, gold weight in eye lid 8/12    Home Medications:  Prescriptions prior to admission  Medication Sig Dispense Refill  . acetaminophen (TYLENOL) 500 MG tablet Take 1,000 mg by mouth every 6 (six) hours as needed. PAIN       .  clopidogrel (PLAVIX) 75 MG tablet Take 75 mg by mouth daily with breakfast.       . metoprolol tartrate (LOPRESSOR) 25 MG tablet Take 25 mg by mouth 2 (two) times daily.       . pantoprazole (PROTONIX) 40 MG tablet Take 40 mg by mouth daily.       . rosuvastatin (CRESTOR) 10 MG tablet Take 10 mg by mouth at bedtime.        Allergies:  Allergies  Allergen Reactions  . Codeine Other (See Comments)    HEADACHE     History reviewed. No pertinent family history. Social History:  reports that he has been smoking.  He has never used smokeless tobacco. He reports that he does not use illicit drugs. His alcohol history not on file.  Review of Systems  Constitutional: Negative.   HENT: Negative.   Eyes: Negative.   Respiratory: Negative.   Cardiovascular: Negative.   Gastrointestinal: Negative.   Genitourinary: Positive for frequency. Negative  for dysuria, urgency and hematuria.  Skin: Negative.   Neurological: Negative.   Endo/Heme/Allergies: Negative.   Psychiatric/Behavioral: Negative.     Physical Exam:  Vital signs in last 24 hours: Temp:  [98.1 F (36.7 C)] 98.1 F (36.7 C) (01/14 1001) Pulse Rate:  [76] 76  (01/14 1001) Resp:  [20] 20  (01/14 1001) BP: (138)/(83) 138/83 mmHg (01/14 1001) SpO2:  [100 %] 100 % (01/14 1001) Physical Exam  Constitutional: He is oriented to person, place, and time. He appears well-developed and well-nourished.  HENT:  Head: Normocephalic.  Cardiovascular: Normal rate and regular rhythm.   Respiratory: Effort normal.  GI: Soft. He exhibits no distension.  Genitourinary: Prostate normal and penis normal.  Neurological: He is alert and oriented to person, place, and time.  Skin: Skin is warm.  Psychiatric: He has a normal mood and affect.    Laboratory Data:  No results found for this or any previous visit (from the past 24 hour(s)). Recent Results (from the past 240 hour(s))  SURGICAL PCR SCREEN     Status: Normal   Collection Time   09/15/11  2:35 PM      Component Value Range Status Comment   MRSA, PCR NEGATIVE  NEGATIVE  Final    Staphylococcus aureus NEGATIVE  NEGATIVE  Final    Creatinine:  Basename 09/15/11 1430  CREATININE 1.08   Baseline Creatinine:   Impression/Assessment:  H/O urothelial malignancy with recent positive cytology.  Plan:  For cysto/ RPGs/possible bx.  Welden Hausmann S 09/22/2011, 10:13 AM

## 2011-09-22 NOTE — Progress Notes (Signed)
Call to Dr Isabel Caprice Re: Rx Of pain med. None on chart

## 2011-09-22 NOTE — Op Note (Signed)
Preoperative diagnosis:History of TCC urinary bladder with positive cytology Postoperative diagnosis:History of TCC urinary bladder with positive cytology  Procedure: Cystoscopy with bladder washings for cytology, bilateral retrograde pyelography and bladder biopsy with fulguration.  Surgeon: Valetta Fuller M.D.  Anesthesia: Gen.  Indications: Mr. Camerer has a prior history of transitional cell carcinoma bladder.  On a recent followup the patient was noted to have cellular atypia on his urine cytology. Repeat office cystoscopy failed to reveal definitive tumor. CT scan of the abdomen and pelvis with contrast also was unremarkable. Repeat cytology where continued to show some atypia. Of note FISH testing was negative. The patient now presents for reevaluation of his urothelium.     Technique and findings: Patient was brought to the operating room where he had successful induction of general anesthesia. Placed in lithotomy position and prepped and draped in usual manner. He received perioperative ciprofloxacin. Appropriate surgical timeout was performed. Cystoscopy revealed unremarkable anterior and prostatic urethra with mild visual obstruction. The bladder was endoscopically normal with the exception of some slight urothelium around the right ureteral orifice.  Saline bladder washings were then performed on the bladder and sent for cytologic analysis. Retrograde pyelograms were done bilaterally with open-ended catheters and fluoroscopic interpretation. Both ureters and collecting systems were delicate without filling defect or obstruction.  2 cold cup biopsies were done adjacent to the right ureteral orifice where the urothelium was slightly erythematous. Bugbee electrode was then used to carefully fulgurate these areas. The ureteral orifice appeared uninjured. The bladder was drained the patient brought to recovery room in stable condition.

## 2011-09-22 NOTE — Transfer of Care (Signed)
Immediate Anesthesia Transfer of Care Note  Patient: Kyle Foster  Procedure(s) Performed:  CYSTOSCOPY WITH RETROGRADE PYELOGRAM - Cystoscopy left Retrograde Pyelogram      (c-arm) ; CYSTOSCOPY WITH BIOPSY -  Biopsy  Patient Location: PACU  Anesthesia Type: General  Level of Consciousness: sedated and patient cooperative  Airway & Oxygen Therapy: Patient Spontanous Breathing and Patient connected to face mask oxygen  Post-op Assessment: Report given to PACU RN and Post -op Vital signs reviewed and stable  Post vital signs: Reviewed and stable Filed Vitals:   09/22/11 1001  BP: 138/83  Pulse: 76  Temp: 36.7 C  Resp: 20    Complications: No apparent anesthesia complications

## 2011-09-22 NOTE — Anesthesia Postprocedure Evaluation (Signed)
  Anesthesia Post-op Note  Patient: Kyle Foster  Procedure(s) Performed:  CYSTOSCOPY WITH RETROGRADE PYELOGRAM - Cystoscopy left Retrograde Pyelogram      (c-arm) ; CYSTOSCOPY WITH BIOPSY -  Biopsy  Patient Location: PACU  Anesthesia Type: General  Level of Consciousness: awake and alert   Airway and Oxygen Therapy: Patient Spontanous Breathing  Post-op Pain: mild  Post-op Assessment: Post-op Vital signs reviewed, Patient's Cardiovascular Status Stable, Respiratory Function Stable, Patent Airway and No signs of Nausea or vomiting  Post-op Vital Signs: stable  Complications: No apparent anesthesia complications

## 2011-09-25 ENCOUNTER — Encounter (HOSPITAL_COMMUNITY): Payer: Self-pay | Admitting: Urology

## 2011-10-28 DIAGNOSIS — H02206 Unspecified lagophthalmos left eye, unspecified eyelid: Secondary | ICD-10-CM | POA: Insufficient documentation

## 2012-08-19 ENCOUNTER — Other Ambulatory Visit (HOSPITAL_COMMUNITY): Payer: Self-pay | Admitting: Cardiovascular Disease

## 2012-08-19 DIAGNOSIS — R0989 Other specified symptoms and signs involving the circulatory and respiratory systems: Secondary | ICD-10-CM

## 2012-09-06 ENCOUNTER — Ambulatory Visit (HOSPITAL_COMMUNITY)
Admission: RE | Admit: 2012-09-06 | Discharge: 2012-09-06 | Disposition: A | Discharge status: Home / Self Care | Payer: Medicare Other | Source: Ambulatory Visit | Attending: Cardiovascular Disease | Admitting: Cardiovascular Disease

## 2012-09-06 DIAGNOSIS — R0989 Other specified symptoms and signs involving the circulatory and respiratory systems: Secondary | ICD-10-CM

## 2012-09-06 NOTE — Progress Notes (Signed)
Carotid duplex completed 

## 2013-02-01 ENCOUNTER — Other Ambulatory Visit: Payer: Self-pay | Admitting: Cardiovascular Disease

## 2013-02-01 LAB — HEPATIC FUNCTION PANEL
ALT: 12 U/L (ref 0–53)
Bilirubin, Direct: 0.2 mg/dL (ref 0.0–0.3)
Indirect Bilirubin: 0.9 mg/dL (ref 0.0–0.9)
Total Bilirubin: 1.1 mg/dL (ref 0.3–1.2)

## 2013-02-01 LAB — LIPID PANEL
LDL Cholesterol: 68 mg/dL (ref 0–99)
Total CHOL/HDL Ratio: 3 Ratio
VLDL: 15 mg/dL (ref 0–40)

## 2013-02-10 ENCOUNTER — Telehealth: Payer: Self-pay | Admitting: Cardiovascular Disease

## 2013-02-10 MED ORDER — ATORVASTATIN CALCIUM 80 MG PO TABS: 80.0000 mg | ORAL_TABLET | Freq: Every day | ORAL | Status: DC

## 2013-02-10 NOTE — Telephone Encounter (Signed)
Been waiting since last Thursday- Need an new prescription because of his ins.-Need his generic Lipitor 80 for #90 and refills-Call to [facility]-[phone]-He is completely out of his medicine-please call today!

## 2013-02-10 NOTE — Telephone Encounter (Signed)
Sent refill atorvastatin 80 mg

## 2013-04-15 ENCOUNTER — Other Ambulatory Visit: Payer: Self-pay | Admitting: Cardiovascular Disease

## 2013-04-15 NOTE — Telephone Encounter (Signed)
Rx was sent to pharmacy electronically. 

## 2013-05-24 ENCOUNTER — Telehealth: Payer: Self-pay | Admitting: Cardiovascular Disease

## 2013-05-24 NOTE — Telephone Encounter (Signed)
Returning your call. °

## 2013-05-24 NOTE — Telephone Encounter (Signed)
I spoke with patient about his [facility] IHD forms.  I will have Dr Allyson Sabal sign the forms next week and I will fax them to the [place].  I will also mail the patient a copy of the forms.

## 2013-06-02 ENCOUNTER — Telehealth: Payer: Self-pay | Admitting: *Deleted

## 2013-06-02 NOTE — Telephone Encounter (Signed)
IHD [place] forms were faxed to the [place] and a copy was mailed to the patient per his request

## 2013-07-13 ENCOUNTER — Encounter: Payer: Self-pay | Admitting: Cardiovascular Disease

## 2013-07-13 ENCOUNTER — Ambulatory Visit (INDEPENDENT_AMBULATORY_CARE_PROVIDER_SITE_OTHER): Payer: Medicare Other | Admitting: Cardiovascular Disease

## 2013-07-13 ENCOUNTER — Other Ambulatory Visit: Payer: Self-pay | Admitting: Cardiovascular Disease

## 2013-07-13 VITALS — BP 122/70 | HR 72 | Ht 71.0 in | Wt 157.0 lb

## 2013-07-13 DIAGNOSIS — I2510 Atherosclerotic heart disease of native coronary artery without angina pectoris: Secondary | ICD-10-CM | POA: Insufficient documentation

## 2013-07-13 DIAGNOSIS — F17200 Nicotine dependence, unspecified, uncomplicated: Secondary | ICD-10-CM

## 2013-07-13 DIAGNOSIS — Z720 Tobacco use: Secondary | ICD-10-CM | POA: Insufficient documentation

## 2013-07-13 DIAGNOSIS — E782 Mixed hyperlipidemia: Secondary | ICD-10-CM | POA: Insufficient documentation

## 2013-07-13 DIAGNOSIS — I10 Essential (primary) hypertension: Secondary | ICD-10-CM

## 2013-07-13 DIAGNOSIS — E785 Hyperlipidemia, unspecified: Secondary | ICD-10-CM

## 2013-07-13 NOTE — Assessment & Plan Note (Signed)
Status post non-ST segment elevation myocardial infarctionwith stenting of his circumflex AV groove and obtuse marginal branches 06/30/08 with driver bare-metal stents. He did have a tandem 70% stenoses in the RCA with normal LV function. He denies chest pain or shortness of breath. His last Myoview performed 07/11/08 was nonischemic.

## 2013-07-13 NOTE — Assessment & Plan Note (Signed)
On statin therapy with recent lipid profile performed 02/01/13 revealed a total cholesterol 124, LDL 68 and HDL of 41

## 2013-07-13 NOTE — Patient Instructions (Signed)
Your physician wants you to follow-up in: 1 year with Dr Berry. You will receive a reminder letter in the mail two months in advance. If you don't receive a letter, please call our office to schedule the follow-up appointment.  

## 2013-07-13 NOTE — Assessment & Plan Note (Signed)
Well-controlled on current medications 

## 2013-07-13 NOTE — Progress Notes (Signed)
07/13/2013 Kyle Foster   08-Dec-1945  [id number]  Primary Physician Pamelia Hoit, MD Primary Cardiologist: Runell Gess MD [name]   HPI:  The patient is a very pleasant 67 year old fit-appearing married Caucasian male, father of 3 and grandfather of 3 grandchildren, whom I last saw a year ago. He has a history of CAD status post non-ST-segment elevation myocardial infarction June 30, 2008. They catheterized him and stented his AV-groove circumflex and marginal branch bifurcation with Driver bare-metal stents. He did have tandem 70% lesions in his RCA and normal LV function. He has been asymptomatic since. He continues to smoke 5 cigarettes a day. Patient is positive for hypertension and hyperlipidemia.since I saw him a year ago he's been asymptomatic. His most recent lipid profile performed 02/01/13 revealed a total cholesterol of 124, LDL 68 and HDL of 41    Current Outpatient Prescriptions  Medication Sig Dispense Refill  . acetaminophen (TYLENOL) 500 MG tablet Take 1,000 mg by mouth every 6 (six) hours as needed. PAIN       . atorvastatin (LIPITOR) 80 MG tablet Take 1 tablet (80 mg total) by mouth daily.  90 tablet  3  . clopidogrel (PLAVIX) 75 MG tablet Take 75 mg by mouth daily with breakfast.       . lisinopril (PRINIVIL,ZESTRIL) 5 MG tablet Take 5 mg by mouth daily.      . metoprolol tartrate (LOPRESSOR) 25 MG tablet Take 25 mg by mouth 2 (two) times daily.       . pantoprazole (PROTONIX) 40 MG tablet TAKE 1 TABLET BY MOUTH EVERY DAY  90 tablet  1   No current facility-administered medications for this visit.    Allergies  Allergen Reactions  . Codeine Other (See Comments)    HEADACHE     History   Social History  . Marital Status: Married    Spouse Name: N/A    Number of Children: N/A  . Years of Education: N/A   Occupational History  . Not on file.   Social History Main Topics  . Smoking status: Current Every Day Smoker -- 0.50  packs/day for 40 years    Types: Cigarettes  . Smokeless tobacco: Never Used  . Alcohol Use: No  . Drug Use: No  . Sexual Activity: Not on file   Other Topics Concern  . Not on file   Social History Narrative  . No narrative on file     Review of Systems: General: negative for chills, fever, night sweats or weight changes.  Cardiovascular: negative for chest pain, dyspnea on exertion, edema, orthopnea, palpitations, paroxysmal nocturnal dyspnea or shortness of breath Dermatological: negative for rash Respiratory: negative for cough or wheezing Urologic: negative for hematuria Abdominal: negative for nausea, vomiting, diarrhea, bright red blood per rectum, melena, or hematemesis Neurologic: negative for visual changes, syncope, or dizziness All other systems reviewed and are otherwise negative except as noted above.    Blood pressure 122/70, pulse 72, height 5\' 11"  (1.803 m), weight 157 lb (71.215 kg).  General appearance: alert and no distress Neck: no adenopathy, no carotid bruit, no JVD, supple, symmetrical, trachea midline and thyroid not enlarged, symmetric, no tenderness/mass/nodules Lungs: clear to auscultation bilaterally Heart: regular rate and rhythm, S1, S2 normal, no murmur, click, rub or gallop Extremities: extremities normal, atraumatic, no cyanosis or edema  EKG normal sinus rhythm at 72 with right bundle-branch block unchanged from prior EKG  ASSESSMENT AND PLAN:   Coronary artery disease Status  post non-ST segment elevation myocardial infarctionwith stenting of his circumflex AV groove and obtuse marginal branches 06/30/08 with driver bare-metal stents. He did have a tandem 70% stenoses in the RCA with normal LV function. He denies chest pain or shortness of breath. His last Myoview performed 07/11/08 was nonischemic.  Essential hypertension Well-controlled on current medications  Hyperlipidemia On statin therapy with recent lipid profile performed 02/01/13  revealed a total cholesterol 124, LDL 68 and HDL of 41      Runell Gess MD [facility], [facility] 07/13/2013 2:43 PM

## 2013-09-21 ENCOUNTER — Other Ambulatory Visit: Payer: Self-pay | Admitting: Cardiovascular Disease

## 2013-09-21 NOTE — Telephone Encounter (Signed)
Rx was sent to pharmacy electronically. 

## 2013-10-08 ENCOUNTER — Other Ambulatory Visit: Payer: Self-pay | Admitting: Cardiovascular Disease

## 2013-10-10 NOTE — Telephone Encounter (Signed)
Rx was sent to pharmacy electronically. 

## 2013-10-11 ENCOUNTER — Other Ambulatory Visit: Payer: Self-pay | Admitting: Cardiovascular Disease

## 2013-10-12 NOTE — Telephone Encounter (Signed)
Rx was sent to pharmacy electronically. 

## 2013-11-01 ENCOUNTER — Other Ambulatory Visit: Payer: Self-pay | Admitting: Cardiovascular Disease

## 2013-11-01 NOTE — Telephone Encounter (Signed)
Rx was sent to pharmacy electronically. 

## 2014-01-31 ENCOUNTER — Other Ambulatory Visit: Payer: Self-pay | Admitting: *Deleted

## 2014-01-31 MED ORDER — ATORVASTATIN CALCIUM 80 MG PO TABS: 80.0000 mg | ORAL_TABLET | Freq: Every day | ORAL | Status: DC

## 2014-01-31 NOTE — Telephone Encounter (Signed)
Rx refill sent to patient pharmacy   

## 2014-07-10 ENCOUNTER — Other Ambulatory Visit: Payer: Self-pay | Admitting: *Deleted

## 2014-07-10 ENCOUNTER — Other Ambulatory Visit: Payer: Self-pay

## 2014-07-10 MED ORDER — METOPROLOL TARTRATE 25 MG PO TABS: 25.0000 mg | ORAL_TABLET | Freq: Two times a day (BID) | ORAL | Status: DC

## 2014-07-10 MED ORDER — PANTOPRAZOLE SODIUM 40 MG PO TBEC: 40.0000 mg | DELAYED_RELEASE_TABLET | Freq: Every day | ORAL | Status: DC

## 2014-07-10 NOTE — Telephone Encounter (Signed)
Rx was sent to pharmacy electronically. 

## 2014-07-19 ENCOUNTER — Ambulatory Visit (INDEPENDENT_AMBULATORY_CARE_PROVIDER_SITE_OTHER): Payer: Medicare Other | Admitting: Cardiovascular Disease

## 2014-07-19 ENCOUNTER — Encounter: Payer: Self-pay | Admitting: Cardiovascular Disease

## 2014-07-19 VITALS — BP 120/78 | HR 70 | Ht 71.0 in | Wt 155.0 lb

## 2014-07-19 DIAGNOSIS — I2510 Atherosclerotic heart disease of native coronary artery without angina pectoris: Secondary | ICD-10-CM

## 2014-07-19 DIAGNOSIS — Z79899 Other long term (current) drug therapy: Secondary | ICD-10-CM

## 2014-07-19 DIAGNOSIS — E785 Hyperlipidemia, unspecified: Secondary | ICD-10-CM

## 2014-07-19 DIAGNOSIS — Z720 Tobacco use: Secondary | ICD-10-CM

## 2014-07-19 DIAGNOSIS — I10 Essential (primary) hypertension: Secondary | ICD-10-CM

## 2014-07-19 NOTE — Progress Notes (Signed)
07/19/2014 Kyle Foster   September 23, 1945  [id number]  Primary Physician Woody Seller, MD Primary Cardiologist: Lorretta Harp MD Renae Gloss   HPI:  The patient is a very pleasant 68 year old fit-appearing married Caucasian male, father of 83 and grandfather of 3 grandchildren, whom I last saw a year ago. He has a history of CAD status post non-ST-segment elevation myocardial infarction June 30, 2008. They catheterized him and stented his AV-groove circumflex and marginal branch bifurcation with Driver bare-metal stents. He did have tandem 70% lesions in his RCA and normal LV function. He has been asymptomatic since. He continues to smoke 5 cigarettes a day. Patient is positive for hypertension and hyperlipidemia.. Since I saw him a year ago he's been asymptomatic. His most recent lipid profile performed 02/01/13 revealed a total cholesterol of 124, LDL 68 and HDL of 41   Current Outpatient Prescriptions  Medication Sig Dispense Refill  . acetaminophen (TYLENOL) 500 MG tablet Take 1,000 mg by mouth every 6 (six) hours as needed. PAIN     . atorvastatin (LIPITOR) 80 MG tablet Take 1 tablet (80 mg total) by mouth daily. 90 tablet 3  . Cholecalciferol (VITAMIN D) 2000 UNITS tablet Take 2,000 Units by mouth 2 (two) times daily.    . clopidogrel (PLAVIX) 75 MG tablet TAKE 1 TABLET BY MOUTH DAILY 90 tablet 2  . lisinopril (PRINIVIL,ZESTRIL) 5 MG tablet TAKE 1 TABLET BY MOUTH EVERY DAY 90 tablet 3  . metoprolol tartrate (LOPRESSOR) 25 MG tablet Take 1 tablet (25 mg total) by mouth 2 (two) times daily. 180 tablet 0  . pantoprazole (PROTONIX) 40 MG tablet Take 1 tablet (40 mg total) by mouth daily. 90 tablet 0   No current facility-administered medications for this visit.    Allergies  Allergen Reactions  . Codeine Other (See Comments)    HEADACHE     History   Social History  . Marital Status: Married    Spouse Name: N/A    Number of Children: N/A  . Years of  Education: N/A   Occupational History  . Not on file.   Social History Main Topics  . Smoking status: Current Every Day Smoker -- 0.50 packs/day for 40 years    Types: Cigarettes  . Smokeless tobacco: Never Used  . Alcohol Use: No  . Drug Use: No  . Sexual Activity: Not on file   Other Topics Concern  . Not on file   Social History Narrative     Review of Systems: General: negative for chills, fever, night sweats or weight changes.  Cardiovascular: negative for chest pain, dyspnea on exertion, edema, orthopnea, palpitations, paroxysmal nocturnal dyspnea or shortness of breath Dermatological: negative for rash Respiratory: negative for cough or wheezing Urologic: negative for hematuria Abdominal: negative for nausea, vomiting, diarrhea, bright red blood per rectum, melena, or hematemesis Neurologic: negative for visual changes, syncope, or dizziness All other systems reviewed and are otherwise negative except as noted above.    Blood pressure 120/78, pulse 70, height 5\' 11"  (1.803 m), weight 155 lb (70.308 kg).  General appearance: alert and no distress Neck: no adenopathy, no carotid bruit, no JVD, supple, symmetrical, trachea midline and thyroid not enlarged, symmetric, no tenderness/mass/nodules Lungs: clear to auscultation bilaterally Heart: regular rate and rhythm, S1, S2 normal, no murmur, click, rub or gallop Extremities: extremities normal, atraumatic, no cyanosis or edema  EKG normal sinus rhythm at 70 with right bundle branch block and left axis deviation. I personally reviewed  this EKG  ASSESSMENT AND PLAN:   Coronary artery disease History of CAD status post non-ST segment elevation myocardial infarction 06/30/08. He had stenting of his marginal branch and AV groove circumflex using bare metal stents. He did have 70% tandem lesions in his RCA which did not appear to be significant and normal LV function. He denies chest pain or shortness of breath.  Tobacco  abuse Continued tobacco abuse of 4-5 cigarettes a day, recalcitrant to risk factor modification  Hyperlipidemia On atorvastatin 80 mg. We will recheck a lipid and liver profile      Lorretta Harp MD [facility], [facility] 07/19/2014 3:31 PM

## 2014-07-19 NOTE — Assessment & Plan Note (Signed)
Continued tobacco abuse of 4-5 cigarettes a day, recalcitrant to risk factor modification

## 2014-07-19 NOTE — Patient Instructions (Signed)
  We will see you back in follow up in 1 year with Dr Berry.   Dr Berry has ordered: Your physician recommends that you return for a FASTING lipid profile    

## 2014-07-19 NOTE — Assessment & Plan Note (Signed)
On atorvastatin 80 mg. We will recheck a lipid and liver profile

## 2014-07-19 NOTE — Assessment & Plan Note (Signed)
History of CAD status post non-ST segment elevation myocardial infarction 06/30/08. He had stenting of his marginal branch and AV groove circumflex using bare metal stents. He did have 70% tandem lesions in his RCA which did not appear to be significant and normal LV function. He denies chest pain or shortness of breath.

## 2014-07-19 NOTE — Assessment & Plan Note (Signed)
His blood pressure today was 120/78 on lisinopril and metoprolol. We will continue his current medications

## 2014-07-25 LAB — HEPATIC FUNCTION PANEL
ALK PHOS: 53 U/L (ref 39–117)
ALT: 9 U/L (ref 0–53)
AST: 15 U/L (ref 0–37)
Albumin: 4 g/dL (ref 3.5–5.2)
BILIRUBIN DIRECT: 0.2 mg/dL (ref 0.0–0.3)
Indirect Bilirubin: 0.5 mg/dL (ref 0.2–1.2)
Total Bilirubin: 0.7 mg/dL (ref 0.2–1.2)
Total Protein: 6.3 g/dL (ref 6.0–8.3)

## 2014-07-25 LAB — LIPID PANEL
Cholesterol: 123 mg/dL (ref 0–200)
HDL: 44 mg/dL (ref >39)
LDL CALC: 65 mg/dL (ref 0–99)
Total CHOL/HDL Ratio: 2.8 Ratio
Triglycerides: 72 mg/dL (ref <150)
VLDL: 14 mg/dL (ref 0–40)

## 2014-07-27 ENCOUNTER — Other Ambulatory Visit: Payer: Self-pay | Admitting: Cardiovascular Disease

## 2014-07-31 ENCOUNTER — Encounter: Payer: Self-pay | Admitting: *Deleted

## 2014-09-13 ENCOUNTER — Other Ambulatory Visit: Payer: Self-pay | Admitting: Cardiovascular Disease

## 2014-09-13 MED ORDER — LISINOPRIL 5 MG PO TABS: 5.0000 mg | ORAL_TABLET | Freq: Every day | ORAL | Status: DC

## 2014-09-13 NOTE — Telephone Encounter (Signed)
Pt have changed pharmacy,he needs his Lisinopril #90 and refills. Please call this new prescription to [facility] [phone].

## 2014-09-13 NOTE — Telephone Encounter (Signed)
Rx(s) sent to pharmacy electronically.  

## 2014-10-09 ENCOUNTER — Telehealth: Payer: Self-pay | Admitting: Cardiovascular Disease

## 2014-10-09 MED ORDER — METOPROLOL TARTRATE 25 MG PO TABS: 25.0000 mg | ORAL_TABLET | Freq: Two times a day (BID) | ORAL | Status: DC

## 2014-10-09 MED ORDER — PANTOPRAZOLE SODIUM 40 MG PO TBEC: 40.0000 mg | DELAYED_RELEASE_TABLET | Freq: Every day | ORAL | Status: DC

## 2014-10-09 NOTE — Telephone Encounter (Signed)
Pt is changing pharmacy. He needs new prescriptions for his Metoprolol #180, and Pantoprazole #90 and refills. Please call to [facility].

## 2014-10-09 NOTE — Telephone Encounter (Signed)
Rx refill sent to patient pharmacy   

## 2014-10-30 ENCOUNTER — Other Ambulatory Visit: Payer: Self-pay | Admitting: Nurse Practitioner

## 2014-10-31 NOTE — Telephone Encounter (Signed)
Rx has been sent to the pharmacy electronically. ° °

## 2014-11-04 ENCOUNTER — Other Ambulatory Visit: Payer: Self-pay | Admitting: Cardiovascular Disease

## 2015-01-24 ENCOUNTER — Other Ambulatory Visit: Payer: Self-pay | Admitting: *Deleted

## 2015-01-24 MED ORDER — ATORVASTATIN CALCIUM 80 MG PO TABS: 80.0000 mg | ORAL_TABLET | Freq: Every day | ORAL | Status: DC

## 2015-01-24 NOTE — Telephone Encounter (Signed)
Rx(s) sent to pharmacy electronically.  

## 2015-02-02 ENCOUNTER — Other Ambulatory Visit: Payer: Self-pay | Admitting: *Deleted

## 2015-02-02 MED ORDER — ATORVASTATIN CALCIUM 80 MG PO TABS: 80.0000 mg | ORAL_TABLET | Freq: Every day | ORAL | Status: DC

## 2015-02-08 ENCOUNTER — Other Ambulatory Visit: Payer: Self-pay | Admitting: Cardiovascular Disease

## 2015-02-08 MED ORDER — ATORVASTATIN CALCIUM 80 MG PO TABS: 80.0000 mg | ORAL_TABLET | Freq: Every day | ORAL | Status: DC

## 2015-02-08 NOTE — Telephone Encounter (Signed)
°  1. Which medications need to be refilled? Atorvastatin -new perscription  2. Which pharmacy is medication to be sent to?[facility]-new [phone]  3. Do they need a 30 day or 90 day supply? 90 and refills  4. Would they like a call back once the medication has been sent to the pharmacy? no

## 2015-02-08 NOTE — Telephone Encounter (Signed)
Rx(s) sent to pharmacy electronically.  

## 2015-04-26 ENCOUNTER — Encounter: Payer: Self-pay | Admitting: *Deleted

## 2015-05-03 ENCOUNTER — Other Ambulatory Visit: Payer: Self-pay | Admitting: *Deleted

## 2015-05-03 MED ORDER — CLOPIDOGREL BISULFATE 75 MG PO TABS: 75.0000 mg | ORAL_TABLET | Freq: Every day | ORAL | Status: DC

## 2015-05-08 ENCOUNTER — Other Ambulatory Visit: Payer: Self-pay | Admitting: Cardiovascular Disease

## 2015-05-08 MED ORDER — ATORVASTATIN CALCIUM 80 MG PO TABS: 80.0000 mg | ORAL_TABLET | Freq: Every day | ORAL | Status: DC

## 2015-05-17 ENCOUNTER — Encounter: Payer: Self-pay | Admitting: Cardiovascular Disease

## 2015-06-29 ENCOUNTER — Other Ambulatory Visit: Payer: Self-pay

## 2015-06-29 MED ORDER — PANTOPRAZOLE SODIUM 40 MG PO TBEC: 40.0000 mg | DELAYED_RELEASE_TABLET | Freq: Every day | ORAL | Status: DC

## 2015-06-29 MED ORDER — METOPROLOL TARTRATE 25 MG PO TABS: 25.0000 mg | ORAL_TABLET | Freq: Two times a day (BID) | ORAL | Status: DC

## 2015-07-24 ENCOUNTER — Ambulatory Visit (INDEPENDENT_AMBULATORY_CARE_PROVIDER_SITE_OTHER): Payer: Medicare Other | Admitting: Cardiovascular Disease

## 2015-07-24 ENCOUNTER — Encounter: Payer: Self-pay | Admitting: Cardiovascular Disease

## 2015-07-24 VITALS — BP 114/72 | HR 61 | Ht 71.0 in | Wt 156.0 lb

## 2015-07-24 DIAGNOSIS — Z720 Tobacco use: Secondary | ICD-10-CM

## 2015-07-24 DIAGNOSIS — E785 Hyperlipidemia, unspecified: Secondary | ICD-10-CM | POA: Diagnosis not present

## 2015-07-24 DIAGNOSIS — I10 Essential (primary) hypertension: Secondary | ICD-10-CM

## 2015-07-24 DIAGNOSIS — I2510 Atherosclerotic heart disease of native coronary artery without angina pectoris: Secondary | ICD-10-CM | POA: Diagnosis not present

## 2015-07-24 DIAGNOSIS — I2583 Coronary atherosclerosis due to lipid rich plaque: Secondary | ICD-10-CM

## 2015-07-24 NOTE — Assessment & Plan Note (Signed)
History of hyperlipidemia on atorvastatin. We will recheck a lipid and liver profile

## 2015-07-24 NOTE — Assessment & Plan Note (Signed)
Continues to smoke 5-6 cigarettes a day recalcitrant to risk factor modification

## 2015-07-24 NOTE — Assessment & Plan Note (Signed)
History of CAD status post non-ST segment elevation myocardial infarction 06/30/08. He had a bare metal stent placed in his AV groove circumflex and marginal branch bifurcation. He did have tandem 70% lesions in his RCA and normal LV function. He denies chest pain or shortness of breath.

## 2015-07-24 NOTE — Patient Instructions (Signed)
Medication Instructions:  Your physician recommends that you continue on your current medications as directed. Please refer to the Current Medication list given to you today.  Labwork: Your physician recommends that you return for lab work in:  FASTING (LIPID/LIVER) The lab can be found on the FIRST FLOOR of out building in [address]   Testing/Procedures: NONE  Follow-Up: Your physician wants you to follow-up in: [place]. You will receive a reminder letter in the mail two months in advance. If you don't receive a letter, please call our office to schedule the follow-up appointment.   Any Other Special Instructions Will Be Listed Below (If Applicable).     If you need a refill on your cardiac medications before your next appointment, please call your pharmacy.

## 2015-07-24 NOTE — Progress Notes (Signed)
07/24/2015 Kyle Foster   [birth year]  [id number]  Primary Physician Verline Lema, MD Primary Cardiologist: Lorretta Harp MD Renae Gloss   HPI:   The patient is a very pleasant 69 year old fit-appearing married Caucasian male, father of 7 and grandfather of 3 grandchildren, whom I last saw a year ago. He has a history of CAD status post non-ST-segment elevation myocardial infarction June 30, 2008. They catheterized him and stented his AV-groove circumflex and marginal branch bifurcation with Driver bare-metal stents. He did have tandem 70% lesions in his RCA and normal LV function. He has been asymptomatic since. He continues to smoke 5 cigarettes a day. Patient is positive for hypertension and hyperlipidemia.. Since I saw him a year ago he's been asymptomatic. His most recent lipid profile performed 07/24/14 revealed total cholesterol of 123, LDL 65 and HDL of 44.  Current Outpatient Prescriptions  Medication Sig Dispense Refill  . acetaminophen (TYLENOL) 500 MG tablet Take 1,000 mg by mouth every 6 (six) hours as needed. PAIN     . atorvastatin (LIPITOR) 80 MG tablet Take 1 tablet (80 mg total) by mouth daily. 90 tablet 0  . Cholecalciferol (VITAMIN D) 2000 UNITS tablet Take 2,000 Units by mouth 2 (two) times daily.    . clopidogrel (PLAVIX) 75 MG tablet Take 1 tablet (75 mg total) by mouth daily. NEED OV. 90 tablet 0  . lisinopril (PRINIVIL,ZESTRIL) 5 MG tablet Take 1 tablet (5 mg total) by mouth daily. 90 tablet 3  . metoprolol tartrate (LOPRESSOR) 25 MG tablet Take 1 tablet (25 mg total) by mouth 2 (two) times daily. 180 tablet 3  . pantoprazole (PROTONIX) 40 MG tablet Take 1 tablet (40 mg total) by mouth daily. 90 tablet 3   No current facility-administered medications for this visit.    Allergies  Allergen Reactions  . Codeine Other (See Comments)    HEADACHE     Social History   Social History  . Marital Status: Married    Spouse Name: N/A  .  Number of Children: N/A  . Years of Education: N/A   Occupational History  . Not on file.   Social History Main Topics  . Smoking status: Current Every Day Smoker -- 0.50 packs/day for 40 years    Types: Cigarettes  . Smokeless tobacco: Never Used  . Alcohol Use: No  . Drug Use: No  . Sexual Activity: Not on file   Other Topics Concern  . Not on file   Social History Narrative     Review of Systems: General: negative for chills, fever, night sweats or weight changes.  Cardiovascular: negative for chest pain, dyspnea on exertion, edema, orthopnea, palpitations, paroxysmal nocturnal dyspnea or shortness of breath Dermatological: negative for rash Respiratory: negative for cough or wheezing Urologic: negative for hematuria Abdominal: negative for nausea, vomiting, diarrhea, bright red blood per rectum, melena, or hematemesis Neurologic: negative for visual changes, syncope, or dizziness All other systems reviewed and are otherwise negative except as noted above.    Blood pressure 114/72, pulse 61, height '5\' 11"'$  (1.803 m), weight 156 lb (70.761 kg).  General appearance: alert and no distress Neck: no adenopathy, no carotid bruit, no JVD, supple, symmetrical, trachea midline and thyroid not enlarged, symmetric, no tenderness/mass/nodules Lungs: clear to auscultation bilaterally Heart: regular rate and rhythm, S1, S2 normal, no murmur, click, rub or gallop Extremities: extremities normal, atraumatic, no cyanosis or edema  EKG normal sinus rhythm at 61 with right bundle-branch block and  left anterior fascicular block (bifascicular block). I personally reviewed this EKG  ASSESSMENT AND PLAN:   Tobacco abuse Continues to smoke 5-6 cigarettes a day recalcitrant to risk factor modification  Hyperlipidemia History of hyperlipidemia on atorvastatin. We will recheck a lipid and liver profile  Essential hypertension History of hypertension with blood pressure measured at 114/72. He  is on lisinopril and metoprolol. Continue current meds at current dose  Coronary artery disease History of CAD status post non-ST segment elevation myocardial infarction 06/30/08. He had a bare metal stent placed in his AV groove circumflex and marginal branch bifurcation. He did have tandem 70% lesions in his RCA and normal LV function. He denies chest pain or shortness of breath.      Lorretta Harp MD FACP,FACC,FAHA, [facility] 07/24/2015 2:05 PM

## 2015-07-24 NOTE — Assessment & Plan Note (Signed)
History of hypertension with blood pressure measured at 114/72. He is on lisinopril and metoprolol. Continue current meds at current dose

## 2015-07-28 LAB — LIPID PANEL
CHOL/HDL RATIO: 2.8 ratio (ref <=5.0)
CHOLESTEROL: 122 mg/dL — AB (ref 125–200)
HDL: 44 mg/dL (ref >=40)
LDL Cholesterol: 67 mg/dL (ref <130)
Triglycerides: 56 mg/dL (ref <150)
VLDL: 11 mg/dL (ref <30)

## 2015-07-28 LAB — HEPATIC FUNCTION PANEL
ALBUMIN: 4.2 g/dL (ref 3.6–5.1)
ALT: 13 U/L (ref 9–46)
AST: 18 U/L (ref 10–35)
Alkaline Phosphatase: 61 U/L (ref 40–115)
Bilirubin, Direct: 0.3 mg/dL — ABNORMAL HIGH (ref <=0.2)
Indirect Bilirubin: 1.1 mg/dL (ref 0.2–1.2)
Total Bilirubin: 1.4 mg/dL — ABNORMAL HIGH (ref 0.2–1.2)
Total Protein: 6.4 g/dL (ref 6.1–8.1)

## 2015-08-01 ENCOUNTER — Other Ambulatory Visit: Payer: Self-pay | Admitting: Cardiovascular Disease

## 2015-08-01 MED ORDER — CLOPIDOGREL BISULFATE 75 MG PO TABS: 75.0000 mg | ORAL_TABLET | Freq: Every day | ORAL | Status: DC

## 2015-09-04 ENCOUNTER — Other Ambulatory Visit: Payer: Self-pay | Admitting: Cardiovascular Disease

## 2015-09-04 MED ORDER — LISINOPRIL 5 MG PO TABS: 5.0000 mg | ORAL_TABLET | Freq: Every day | ORAL | Status: DC

## 2015-11-01 ENCOUNTER — Other Ambulatory Visit: Payer: Self-pay

## 2015-11-01 MED ORDER — ATORVASTATIN CALCIUM 80 MG PO TABS: 80.0000 mg | ORAL_TABLET | Freq: Every day | ORAL | Status: DC

## 2015-11-01 NOTE — Telephone Encounter (Signed)
Rx(s) sent to pharmacy electronically.  

## 2015-11-30 ENCOUNTER — Other Ambulatory Visit: Payer: Self-pay | Admitting: Cardiovascular Disease

## 2015-11-30 NOTE — Telephone Encounter (Signed)
Rx request sent to pharmacy.  

## 2015-12-24 DIAGNOSIS — G510 Bell's palsy: Secondary | ICD-10-CM | POA: Insufficient documentation

## 2016-01-11 HISTORY — PX: SURGERY OF LIP: SUR1315

## 2016-06-24 ENCOUNTER — Other Ambulatory Visit: Payer: Self-pay

## 2016-06-25 ENCOUNTER — Other Ambulatory Visit: Payer: Self-pay | Admitting: *Deleted

## 2016-06-26 ENCOUNTER — Other Ambulatory Visit: Payer: Self-pay | Admitting: *Deleted

## 2016-06-26 MED ORDER — METOPROLOL TARTRATE 25 MG PO TABS: 25.0000 mg | ORAL_TABLET | Freq: Two times a day (BID) | ORAL | 0 refills | Status: DC

## 2016-06-26 MED ORDER — PANTOPRAZOLE SODIUM 40 MG PO TBEC: 40.0000 mg | DELAYED_RELEASE_TABLET | Freq: Every day | ORAL | 0 refills | Status: DC

## 2016-06-26 NOTE — Telephone Encounter (Signed)
REFILL 

## 2016-07-28 ENCOUNTER — Other Ambulatory Visit: Payer: Self-pay | Admitting: Cardiovascular Disease

## 2016-07-29 ENCOUNTER — Other Ambulatory Visit: Payer: Self-pay | Admitting: *Deleted

## 2016-07-29 MED ORDER — CLOPIDOGREL BISULFATE 75 MG PO TABS: 75.0000 mg | ORAL_TABLET | Freq: Every day | ORAL | 0 refills | Status: DC

## 2016-07-29 NOTE — Telephone Encounter (Signed)
[place]  NO REFILLS   APPOINTMENT 08/06/16.

## 2016-08-04 ENCOUNTER — Encounter: Payer: Self-pay | Admitting: *Deleted

## 2016-08-06 ENCOUNTER — Encounter: Payer: Self-pay | Admitting: Cardiovascular Disease

## 2016-08-06 ENCOUNTER — Ambulatory Visit (INDEPENDENT_AMBULATORY_CARE_PROVIDER_SITE_OTHER): Payer: Medicare Other | Admitting: Cardiovascular Disease

## 2016-08-06 VITALS — BP 128/74 | HR 63 | Ht 71.0 in | Wt 150.0 lb

## 2016-08-06 DIAGNOSIS — I2583 Coronary atherosclerosis due to lipid rich plaque: Secondary | ICD-10-CM

## 2016-08-06 DIAGNOSIS — I10 Essential (primary) hypertension: Secondary | ICD-10-CM

## 2016-08-06 DIAGNOSIS — Z720 Tobacco use: Secondary | ICD-10-CM

## 2016-08-06 DIAGNOSIS — E785 Hyperlipidemia, unspecified: Secondary | ICD-10-CM

## 2016-08-06 DIAGNOSIS — I2510 Atherosclerotic heart disease of native coronary artery without angina pectoris: Secondary | ICD-10-CM

## 2016-08-06 NOTE — Assessment & Plan Note (Signed)
History of coronary artery disease status post non-ST segment elevation myocardial infarction 06/30/2008. His AV groove circumflex and marginal branch bifurcation were stented with Driver bare-metal stents. He did have tandem 70% lesions in his RCA and normal LV function. He denies chest pain or shortness of breath.

## 2016-08-06 NOTE — Progress Notes (Signed)
08/06/2016 [name]   1946/06/20  [id number]  Primary Physician Verline Lema, MD Primary Cardiologist: Lorretta Harp MD Lupe Carney, [place]  HPI:  The patient is a very pleasant 70 year old fit-appearing married Caucasian male, father of 19 and grandfather of 3 grandchildren, whom I last saw in the office 07/24/15.  He has a history of CAD status post non-ST-segment elevation myocardial infarction June 30, 2008. They catheterized him and stented his AV-groove circumflex and marginal branch bifurcation with Driver bare-metal stents. He did have tandem 70% lesions in his RCA and normal LV function. He has been asymptomatic since. He continues to smoke 5 cigarettes a day. Patient is positive for hypertension and hyperlipidemia.. Since I saw him a year ago he's been asymptomatic. His most recent lipid profile performed 07/24/14 revealed total cholesterol of 123, LDL 65 and HDL of 44.   Current Outpatient Prescriptions  Medication Sig Dispense Refill  . acetaminophen (TYLENOL) 500 MG tablet Take 1,000 mg by mouth every 6 (six) hours as needed. PAIN     . atorvastatin (LIPITOR) 80 MG tablet TAKE 1 TABLET(80 MG) BY MOUTH DAILY 90 tablet 0  . Cholecalciferol (VITAMIN D) 2000 UNITS tablet Take 2,000 Units by mouth 2 (two) times daily.    . clopidogrel (PLAVIX) 75 MG tablet Take 1 tablet (75 mg total) by mouth daily. KEEP APPT  FOR 08/06/16 90 tablet 0  . lisinopril (PRINIVIL,ZESTRIL) 5 MG tablet TAKE 1 TABLET BY MOUTH DAILY 90 tablet 2  . metoprolol tartrate (LOPRESSOR) 25 MG tablet Take 1 tablet (25 mg total) by mouth 2 (two) times daily. KEEP OV. 180 tablet 0  . pantoprazole (PROTONIX) 40 MG tablet Take 1 tablet (40 mg total) by mouth daily. KEEP OV. 90 tablet 0   No current facility-administered medications for this visit.     Allergies  Allergen Reactions  . Codeine Other (See Comments)    HEADACHE     Social History   Social History  . Marital status: Married    Spouse name: N/A  . Number of children: N/A  . Years of education: N/A   Occupational History  . Not on file.   Social History Main Topics  . Smoking status: Current Every Day Smoker    Packs/day: 0.50    Years: 40.00    Types: Cigarettes  . Smokeless tobacco: Never Used  . Alcohol use No  . Drug use: No  . Sexual activity: Not on file   Other Topics Concern  . Not on file   Social History Narrative  . No narrative on file     Review of Systems: General: negative for chills, fever, night sweats or weight changes.  Cardiovascular: negative for chest pain, dyspnea on exertion, edema, orthopnea, palpitations, paroxysmal nocturnal dyspnea or shortness of breath Dermatological: negative for rash Respiratory: negative for cough or wheezing Urologic: negative for hematuria Abdominal: negative for nausea, vomiting, diarrhea, bright red blood per rectum, melena, or hematemesis Neurologic: negative for visual changes, syncope, or dizziness All other systems reviewed and are otherwise negative except as noted above.    Blood pressure 128/74, pulse 63, height '5\' 11"'$  (1.803 m), weight 150 lb (68 kg).  General appearance: alert and no distress Neck: no adenopathy, no carotid bruit, no JVD, supple, symmetrical, trachea midline and thyroid not enlarged, symmetric, no tenderness/mass/nodules Lungs: clear to auscultation bilaterally Heart: regular rate and rhythm, S1, S2 normal, no murmur, click, rub or gallop Extremities: extremities normal, atraumatic, no  cyanosis or edema  EKG sinus rhythm at 63 with right bundle-branch block and left axis deviation. I personally reviewed this EKG  ASSESSMENT AND PLAN:   Coronary artery disease History of coronary artery disease status post non-ST segment elevation myocardial infarction 06/30/2008. His AV groove circumflex and marginal branch bifurcation were stented with Driver bare-metal stents. He did have tandem 70% lesions in his RCA and normal  LV function. He denies chest pain or shortness of breath.  Essential hypertension History of hypertension blood pressure measured 120/74. He is on lisinopril and metoprolol. Continue current meds at current dosing  Hyperlipidemia History of hyperlipidemia on statin therapy. We will recheck a lipid and liver profile  Tobacco abuse History of ongoing tobacco abuse of 2 packs per week recalcitrant risk factor modification      Lorretta Harp MD [facility], [facility] 08/06/2016 3:54 PM

## 2016-08-06 NOTE — Assessment & Plan Note (Signed)
History of hypertension blood pressure measured 120/74. He is on lisinopril and metoprolol. Continue current meds at current dosing

## 2016-08-06 NOTE — Assessment & Plan Note (Signed)
History of hyperlipidemia on statin therapy. We will recheck a lipid and liver profile 

## 2016-08-06 NOTE — Assessment & Plan Note (Signed)
History of ongoing tobacco abuse of 2 packs per week recalcitrant risk factor modification

## 2016-08-06 NOTE — Patient Instructions (Signed)
Medication Instructions: Your physician recommends that you continue on your current medications as directed. Please refer to the Current Medication list given to you today.  Labwork: Your physician recommends that you return for a FASTING lipid profile and hepatic function panel.   Follow-Up: Your physician wants you to follow-up in: 1 year with Dr. Berry. You will receive a reminder letter in the mail two months in advance. If you don't receive a letter, please call our office to schedule the follow-up appointment.  If you need a refill on your cardiac medications before your next appointment, please call your pharmacy.  

## 2016-08-07 LAB — HEPATIC FUNCTION PANEL
ALBUMIN: 4.1 g/dL (ref 3.6–5.1)
ALK PHOS: 58 U/L (ref 40–115)
ALT: 17 U/L (ref 9–46)
AST: 20 U/L (ref 10–35)
BILIRUBIN INDIRECT: 0.7 mg/dL (ref 0.2–1.2)
BILIRUBIN TOTAL: 0.9 mg/dL (ref 0.2–1.2)
Bilirubin, Direct: 0.2 mg/dL (ref <=0.2)
Total Protein: 6.3 g/dL (ref 6.1–8.1)

## 2016-08-07 LAB — LIPID PANEL
Cholesterol: 123 mg/dL (ref <200)
HDL: 50 mg/dL (ref >40)
LDL CALC: 63 mg/dL (ref <100)
Total CHOL/HDL Ratio: 2.5 Ratio (ref <5.0)
Triglycerides: 52 mg/dL (ref <150)
VLDL: 10 mg/dL (ref <30)

## 2016-08-11 ENCOUNTER — Encounter: Payer: Self-pay | Admitting: Cardiovascular Disease

## 2016-08-24 ENCOUNTER — Other Ambulatory Visit: Payer: Self-pay | Admitting: Cardiovascular Disease

## 2016-09-25 ENCOUNTER — Other Ambulatory Visit: Payer: Self-pay | Admitting: Cardiovascular Disease

## 2016-09-26 ENCOUNTER — Other Ambulatory Visit: Payer: Self-pay

## 2016-09-26 MED ORDER — LISINOPRIL 5 MG PO TABS: 5.0000 mg | ORAL_TABLET | Freq: Every day | ORAL | 3 refills | Status: DC

## 2016-09-26 MED ORDER — PANTOPRAZOLE SODIUM 40 MG PO TBEC: 40.0000 mg | DELAYED_RELEASE_TABLET | Freq: Every day | ORAL | 3 refills | Status: DC

## 2016-09-26 MED ORDER — METOPROLOL TARTRATE 25 MG PO TABS: 25.0000 mg | ORAL_TABLET | Freq: Two times a day (BID) | ORAL | 3 refills | Status: DC

## 2016-09-26 MED ORDER — ATORVASTATIN CALCIUM 80 MG PO TABS: 80.0000 mg | ORAL_TABLET | Freq: Every day | ORAL | 3 refills | Status: DC

## 2016-09-26 MED ORDER — CLOPIDOGREL BISULFATE 75 MG PO TABS: 75.0000 mg | ORAL_TABLET | Freq: Every day | ORAL | 3 refills | Status: DC

## 2016-11-05 DIAGNOSIS — Z8551 Personal history of malignant neoplasm of bladder: Secondary | ICD-10-CM | POA: Diagnosis not present

## 2017-08-04 ENCOUNTER — Encounter: Payer: Self-pay | Admitting: Cardiovascular Disease

## 2017-08-04 ENCOUNTER — Ambulatory Visit (INDEPENDENT_AMBULATORY_CARE_PROVIDER_SITE_OTHER): Payer: Medicare Other | Admitting: Cardiovascular Disease

## 2017-08-04 DIAGNOSIS — I10 Essential (primary) hypertension: Secondary | ICD-10-CM | POA: Diagnosis not present

## 2017-08-04 DIAGNOSIS — I2510 Atherosclerotic heart disease of native coronary artery without angina pectoris: Secondary | ICD-10-CM

## 2017-08-04 DIAGNOSIS — E7800 Pure hypercholesterolemia, unspecified: Secondary | ICD-10-CM | POA: Diagnosis not present

## 2017-08-04 DIAGNOSIS — Z720 Tobacco use: Secondary | ICD-10-CM

## 2017-08-04 NOTE — Progress Notes (Signed)
08/04/2017 Kyle Foster   1946-02-16  [id number]  Primary Physician Verline Lema, MD Primary Cardiologist: Lorretta Harp MD FACP, [place], [place], [place]  HPI:  Kyle Foster is a 71 y.o.  fit-appearing married Caucasian male, father of 68 and grandfather of 3 grandchildren, whom I last saw in the office 08/06/16.  He has a history of CAD status post non-ST-segment elevation myocardial infarction June 30, 2008. They catheterized him and stented his AV-groove circumflex and marginal branch bifurcation with Driver bare-metal stents. He did have tandem 70% lesions in his RCA and normal LV function. He has been asymptomatic since. He continues to smoke 5 cigarettes a day. Patient is positive for hypertension and hyperlipidemia.. Since I saw him a year ago he's been asymptomatic. His most recent lipid profile performed 08/06/16 revealed total cholesterol 123, LDL 63 and HDL of 50.    Current Meds  Medication Sig  . acetaminophen (TYLENOL) 500 MG tablet Take 1,000 mg by mouth every 6 (six) hours as needed. PAIN   . atorvastatin (LIPITOR) 80 MG tablet Take 1 tablet (80 mg total) by mouth daily at 6 PM.  . Cholecalciferol (VITAMIN D) 2000 UNITS tablet Take 2,000 Units by mouth 2 (two) times daily.  . clopidogrel (PLAVIX) 75 MG tablet Take 1 tablet (75 mg total) by mouth daily. KEEP APPT  FOR 08/06/16  . Hypromellose (ARTIFICIAL TEARS OP) Apply 1 drop to eye at bedtime.  Marland Kitchen lisinopril (PRINIVIL,ZESTRIL) 5 MG tablet Take 1 tablet (5 mg total) by mouth daily.  . metoprolol tartrate (LOPRESSOR) 25 MG tablet Take 1 tablet (25 mg total) by mouth 2 (two) times daily. KEEP OV.  . pantoprazole (PROTONIX) 40 MG tablet Take 1 tablet (40 mg total) by mouth daily. KEEP OV.     Allergies  Allergen Reactions  . Codeine Other (See Comments)    HEADACHE  headaches    Social History   Socioeconomic History  . Marital status: Married    Spouse name: Not on file  . Number of children: Not on file   . Years of education: Not on file  . Highest education level: Not on file  Social Needs  . Financial resource strain: Not on file  . Food insecurity - worry: Not on file  . Food insecurity - inability: Not on file  . Transportation needs - medical: Not on file  . Transportation needs - non-medical: Not on file  Occupational History  . Not on file  Tobacco Use  . Smoking status: Current Every Day Smoker    Packs/day: 0.50    Years: 40.00    Pack years: 20.00    Types: Cigarettes  . Smokeless tobacco: Never Used  Substance and Sexual Activity  . Alcohol use: No  . Drug use: No  . Sexual activity: Not on file  Other Topics Concern  . Not on file  Social History Narrative  . Not on file     Review of Systems: General: negative for chills, fever, night sweats or weight changes.  Cardiovascular: negative for chest pain, dyspnea on exertion, edema, orthopnea, palpitations, paroxysmal nocturnal dyspnea or shortness of breath Dermatological: negative for rash Respiratory: negative for cough or wheezing Urologic: negative for hematuria Abdominal: negative for nausea, vomiting, diarrhea, bright red blood per rectum, melena, or hematemesis Neurologic: negative for visual changes, syncope, or dizziness All other systems reviewed and are otherwise negative except as noted above.    Blood pressure 126/66, pulse 72, height 7' (  2.134 m), weight 152 lb (68.9 kg).  General appearance: alert and no distress Neck: no adenopathy, no carotid bruit, no JVD, supple, symmetrical, trachea midline and thyroid not enlarged, symmetric, no tenderness/mass/nodules Lungs: clear to auscultation bilaterally Heart: regular rate and rhythm, S1, S2 normal, no murmur, click, rub or gallop Extremities: extremities normal, atraumatic, no cyanosis or edema Pulses: 2+ and symmetric Skin: Skin color, texture, turgor normal. No rashes or lesions Neurologic: Alert and oriented X 3, normal strength and tone.  Normal symmetric reflexes. Normal coordination and gait  EKG sinus rhythm at 72 with right bundle-branch block and left axis deviation. I personally reviewed this EKG.  ASSESSMENT AND PLAN:   Coronary artery disease History of CAD status post non-ST segment elevation myocardial infarction10/23/09. I catheterized him and stented his AV groove circumflex and obtuse marginal branch bifurcation with driver bare-metal stents. He did have 70% tandem lesions in his RCA and normal LV function. He denies chest pain or shortness of breath.  Essential hypertension History of essential hypertension blood pressure measured 126/66. He is on lisinopril and metoprolol. Continue current meds at current dosing.  Hyperlipidemia History of hyperlipidemia on statin therapy with recent lipid profile performed 08/06/16 revealed a total cholesterol 123, LDL 63 and HDL of 50.  Tobacco abuse History of continued tobacco abuse of 2 packs per week recalcitrant risk factor modification.      Lorretta Harp MD FACP,FACC,FAHA, [facility] 08/04/2017 4:30 PM

## 2017-08-04 NOTE — Assessment & Plan Note (Signed)
History of continued tobacco abuse of 2 packs per week recalcitrant risk factor modification.

## 2017-08-04 NOTE — Assessment & Plan Note (Signed)
History of hyperlipidemia on statin therapy with recent lipid profile performed 08/06/16 revealed a total cholesterol 123, LDL 63 and HDL of 50.

## 2017-08-04 NOTE — Assessment & Plan Note (Signed)
History of CAD status post non-ST segment elevation myocardial infarction10/23/09. I catheterized him and stented his AV groove circumflex and obtuse marginal branch bifurcation with driver bare-metal stents. He did have 70% tandem lesions in his RCA and normal LV function. He denies chest pain or shortness of breath.

## 2017-08-04 NOTE — Assessment & Plan Note (Signed)
History of essential hypertension blood pressure measured 126/66. He is on lisinopril and metoprolol. Continue current meds at current dosing.

## 2017-08-04 NOTE — Patient Instructions (Signed)

## 2017-08-12 DIAGNOSIS — Z08 Encounter for follow-up examination after completed treatment for malignant neoplasm: Secondary | ICD-10-CM | POA: Diagnosis not present

## 2017-08-12 DIAGNOSIS — C07 Malignant neoplasm of parotid gland: Secondary | ICD-10-CM | POA: Diagnosis not present

## 2017-08-12 DIAGNOSIS — Z85818 Personal history of malignant neoplasm of other sites of lip, oral cavity, and pharynx: Secondary | ICD-10-CM | POA: Diagnosis not present

## 2017-08-12 DIAGNOSIS — H6123 Impacted cerumen, bilateral: Secondary | ICD-10-CM | POA: Diagnosis not present

## 2017-08-28 ENCOUNTER — Other Ambulatory Visit: Payer: Self-pay | Admitting: *Deleted

## 2017-08-28 ENCOUNTER — Other Ambulatory Visit: Payer: Self-pay

## 2017-08-28 MED ORDER — LISINOPRIL 5 MG PO TABS: 5.0000 mg | ORAL_TABLET | Freq: Every day | ORAL | 3 refills | Status: DC

## 2017-09-19 ENCOUNTER — Other Ambulatory Visit: Payer: Self-pay | Admitting: Cardiovascular Disease

## 2017-09-21 NOTE — Telephone Encounter (Signed)
Rx(s) sent to pharmacy electronically.  

## 2017-10-22 ENCOUNTER — Other Ambulatory Visit: Payer: Self-pay | Admitting: *Deleted

## 2017-10-22 MED ORDER — ATORVASTATIN CALCIUM 80 MG PO TABS: 80.0000 mg | ORAL_TABLET | Freq: Every day | ORAL | 3 refills | Status: DC

## 2017-10-24 ENCOUNTER — Other Ambulatory Visit: Payer: Self-pay | Admitting: Cardiovascular Disease

## 2017-10-26 ENCOUNTER — Other Ambulatory Visit: Payer: Self-pay | Admitting: Cardiovascular Disease

## 2017-10-26 NOTE — Telephone Encounter (Signed)
REFILL 

## 2017-10-28 ENCOUNTER — Other Ambulatory Visit: Payer: Self-pay | Admitting: Cardiovascular Disease

## 2017-11-02 DIAGNOSIS — Z8551 Personal history of malignant neoplasm of bladder: Secondary | ICD-10-CM | POA: Diagnosis not present

## 2017-12-14 ENCOUNTER — Other Ambulatory Visit: Payer: Self-pay | Admitting: Cardiovascular Disease

## 2018-02-06 HISTORY — PX: INGUINAL HERNIA REPAIR: SUR1180

## 2018-06-14 ENCOUNTER — Emergency Department (HOSPITAL_COMMUNITY): Payer: Medicare Other

## 2018-06-14 ENCOUNTER — Other Ambulatory Visit: Payer: Self-pay

## 2018-06-14 ENCOUNTER — Observation Stay (HOSPITAL_COMMUNITY)
Admission: EM | Admit: 2018-06-14 | Discharge: 2018-06-15 | Disposition: A | Discharge status: Home / Self Care | Payer: Medicare Other | Attending: Internal Medicine | Admitting: Internal Medicine

## 2018-06-14 ENCOUNTER — Encounter (HOSPITAL_COMMUNITY): Payer: Self-pay

## 2018-06-14 DIAGNOSIS — N189 Chronic kidney disease, unspecified: Secondary | ICD-10-CM | POA: Insufficient documentation

## 2018-06-14 DIAGNOSIS — Y93H2 Activity, gardening and landscaping: Secondary | ICD-10-CM | POA: Diagnosis not present

## 2018-06-14 DIAGNOSIS — Z7982 Long term (current) use of aspirin: Secondary | ICD-10-CM | POA: Diagnosis not present

## 2018-06-14 DIAGNOSIS — R0789 Other chest pain: Secondary | ICD-10-CM

## 2018-06-14 DIAGNOSIS — I129 Hypertensive chronic kidney disease with stage 1 through stage 4 chronic kidney disease, or unspecified chronic kidney disease: Secondary | ICD-10-CM | POA: Diagnosis not present

## 2018-06-14 DIAGNOSIS — I10 Essential (primary) hypertension: Secondary | ICD-10-CM | POA: Diagnosis present

## 2018-06-14 DIAGNOSIS — R079 Chest pain, unspecified: Secondary | ICD-10-CM | POA: Diagnosis not present

## 2018-06-14 DIAGNOSIS — S29012A Strain of muscle and tendon of back wall of thorax, initial encounter: Principal | ICD-10-CM | POA: Insufficient documentation

## 2018-06-14 DIAGNOSIS — Z8551 Personal history of malignant neoplasm of bladder: Secondary | ICD-10-CM | POA: Insufficient documentation

## 2018-06-14 DIAGNOSIS — Z955 Presence of coronary angioplasty implant and graft: Secondary | ICD-10-CM | POA: Diagnosis not present

## 2018-06-14 DIAGNOSIS — E785 Hyperlipidemia, unspecified: Secondary | ICD-10-CM | POA: Insufficient documentation

## 2018-06-14 DIAGNOSIS — Z7902 Long term (current) use of antithrombotics/antiplatelets: Secondary | ICD-10-CM | POA: Diagnosis not present

## 2018-06-14 DIAGNOSIS — I2510 Atherosclerotic heart disease of native coronary artery without angina pectoris: Secondary | ICD-10-CM | POA: Insufficient documentation

## 2018-06-14 DIAGNOSIS — Z885 Allergy status to narcotic agent status: Secondary | ICD-10-CM | POA: Diagnosis not present

## 2018-06-14 DIAGNOSIS — I252 Old myocardial infarction: Secondary | ICD-10-CM | POA: Diagnosis not present

## 2018-06-14 DIAGNOSIS — Z79899 Other long term (current) drug therapy: Secondary | ICD-10-CM | POA: Diagnosis not present

## 2018-06-14 DIAGNOSIS — F17210 Nicotine dependence, cigarettes, uncomplicated: Secondary | ICD-10-CM | POA: Insufficient documentation

## 2018-06-14 DIAGNOSIS — J209 Acute bronchitis, unspecified: Secondary | ICD-10-CM | POA: Diagnosis not present

## 2018-06-14 DIAGNOSIS — Z87442 Personal history of urinary calculi: Secondary | ICD-10-CM | POA: Diagnosis not present

## 2018-06-14 DIAGNOSIS — R0902 Hypoxemia: Secondary | ICD-10-CM | POA: Diagnosis not present

## 2018-06-14 HISTORY — DX: Personal history of urinary calculi: Z87.442

## 2018-06-14 HISTORY — DX: Malignant neoplasm of bladder, unspecified: C67.9

## 2018-06-14 HISTORY — DX: Unspecified malignant neoplasm of skin, unspecified: C44.90

## 2018-06-14 HISTORY — DX: Malignant neoplasm of parotid gland: C07

## 2018-06-14 LAB — COMPREHENSIVE METABOLIC PANEL
ALBUMIN: 2.6 g/dL — AB (ref 3.5–5.0)
ALK PHOS: 45 U/L (ref 38–126)
ALT: 10 U/L (ref 0–44)
ANION GAP: 5 (ref 5–15)
AST: 14 U/L — AB (ref 15–41)
BILIRUBIN TOTAL: 1.1 mg/dL (ref 0.3–1.2)
BUN: 14 mg/dL (ref 8–23)
CO2: 22 mmol/L (ref 22–32)
Calcium: 7.5 mg/dL — ABNORMAL LOW (ref 8.9–10.3)
Chloride: 113 mmol/L — ABNORMAL HIGH (ref 98–111)
Creatinine, Ser: 0.77 mg/dL (ref 0.61–1.24)
GFR calc Af Amer: >60 mL/min (ref >60)
GFR calc non Af Amer: >60 mL/min (ref >60)
GLUCOSE: 72 mg/dL (ref 70–99)
POTASSIUM: 3.1 mmol/L — AB (ref 3.5–5.1)
SODIUM: 140 mmol/L (ref 135–145)
TOTAL PROTEIN: 4.9 g/dL — AB (ref 6.5–8.1)

## 2018-06-14 LAB — CBC WITH DIFFERENTIAL/PLATELET
ABS IMMATURE GRANULOCYTES: 0 10*3/uL (ref 0.0–0.1)
BASOS ABS: 0 10*3/uL (ref 0.0–0.1)
Basophils Relative: 0 %
EOS PCT: 1 %
Eosinophils Absolute: 0.1 10*3/uL (ref 0.0–0.7)
HCT: 33.4 % — ABNORMAL LOW (ref 39.0–52.0)
HEMOGLOBIN: 10.7 g/dL — AB (ref 13.0–17.0)
Immature Granulocytes: 0 %
LYMPHS PCT: 9 %
Lymphs Abs: 0.7 10*3/uL (ref 0.7–4.0)
MCH: 31.7 pg (ref 26.0–34.0)
MCHC: 32 g/dL (ref 30.0–36.0)
MCV: 98.8 fL (ref 78.0–100.0)
MONO ABS: 0.8 10*3/uL (ref 0.1–1.0)
MONOS PCT: 10 %
NEUTROS ABS: 6.3 10*3/uL (ref 1.7–7.7)
Neutrophils Relative %: 80 %
Platelets: 129 10*3/uL — ABNORMAL LOW (ref 150–400)
RBC: 3.38 MIL/uL — ABNORMAL LOW (ref 4.22–5.81)
RDW: 12.7 % (ref 11.5–15.5)
WBC: 7.8 10*3/uL (ref 4.0–10.5)

## 2018-06-14 LAB — TROPONIN I

## 2018-06-14 LAB — CREATININE, SERUM
CREATININE: 0.95 mg/dL (ref 0.61–1.24)
GFR calc Af Amer: >60 mL/min (ref >60)
GFR calc non Af Amer: >60 mL/min (ref >60)

## 2018-06-14 LAB — I-STAT TROPONIN, ED: Troponin i, poc: 0 ng/mL (ref 0.00–0.08)

## 2018-06-14 LAB — CBC
HCT: 40.8 % (ref 39.0–52.0)
HEMOGLOBIN: 13.3 g/dL (ref 13.0–17.0)
MCH: 32 pg (ref 26.0–34.0)
MCHC: 32.6 g/dL (ref 30.0–36.0)
MCV: 98.1 fL (ref 78.0–100.0)
Platelets: 162 10*3/uL (ref 150–400)
RBC: 4.16 MIL/uL — AB (ref 4.22–5.81)
RDW: 12.8 % (ref 11.5–15.5)
WBC: 7.6 10*3/uL (ref 4.0–10.5)

## 2018-06-14 MED ORDER — ACETAMINOPHEN 325 MG PO TABS: 650.0000 mg | ORAL_TABLET | Freq: Four times a day (QID) | ORAL | Status: DC | PRN

## 2018-06-14 MED ORDER — INFLUENZA VAC SPLIT HIGH-DOSE 0.5 ML IM SUSY
0.5000 mL | PREFILLED_SYRINGE | INTRAMUSCULAR | Status: DC
  Filled 2018-06-14 (×2): qty 0.5

## 2018-06-14 MED ORDER — CLOPIDOGREL BISULFATE 75 MG PO TABS
75.0000 mg | ORAL_TABLET | Freq: Every day | ORAL | Status: DC
  Administered 2018-06-15: 75 mg via ORAL
  Filled 2018-06-14: qty 1

## 2018-06-14 MED ORDER — KETOROLAC TROMETHAMINE 15 MG/ML IJ SOLN
15.0000 mg | Freq: Four times a day (QID) | INTRAMUSCULAR | Status: DC | PRN
  Administered 2018-06-15: 15 mg via INTRAVENOUS
  Filled 2018-06-14: qty 1

## 2018-06-14 MED ORDER — POLYETHYLENE GLYCOL 3350 17 G PO PACK: 17.0000 g | PACK | Freq: Every day | ORAL | Status: DC | PRN

## 2018-06-14 MED ORDER — LORATADINE 10 MG PO TABS
10.0000 mg | ORAL_TABLET | Freq: Every day | ORAL | Status: DC
  Administered 2018-06-14 – 2018-06-15 (×2): 10 mg via ORAL
  Filled 2018-06-14 (×2): qty 1

## 2018-06-14 MED ORDER — DM-GUAIFENESIN ER 30-600 MG PO TB12
1.0000 | ORAL_TABLET | Freq: Two times a day (BID) | ORAL | Status: DC
  Administered 2018-06-14 – 2018-06-15 (×2): 1 via ORAL
  Filled 2018-06-14 (×2): qty 1

## 2018-06-14 MED ORDER — SALINE SPRAY 0.65 % NA SOLN: 1.0000 | NASAL | Status: DC | PRN

## 2018-06-14 MED ORDER — ATORVASTATIN CALCIUM 80 MG PO TABS
80.0000 mg | ORAL_TABLET | Freq: Every day | ORAL | Status: DC
  Administered 2018-06-14: 80 mg via ORAL
  Filled 2018-06-14: qty 4

## 2018-06-14 MED ORDER — SODIUM CHLORIDE 0.9% FLUSH
3.0000 mL | Freq: Two times a day (BID) | INTRAVENOUS | Status: DC
  Administered 2018-06-15: 3 mL via INTRAVENOUS

## 2018-06-14 MED ORDER — FLUTICASONE PROPIONATE 50 MCG/ACT NA SUSP
1.0000 | Freq: Every day | NASAL | Status: DC
  Administered 2018-06-14: 1 via NASAL
  Filled 2018-06-14 (×2): qty 16

## 2018-06-14 MED ORDER — ACETAMINOPHEN 650 MG RE SUPP: 650.0000 mg | Freq: Four times a day (QID) | RECTAL | Status: DC | PRN

## 2018-06-14 MED ORDER — POLYVINYL ALCOHOL 1.4 % OP SOLN
1.0000 [drp] | Freq: Every day | OPHTHALMIC | Status: DC
  Administered 2018-06-14: 1 [drp] via OPHTHALMIC
  Filled 2018-06-14: qty 15

## 2018-06-14 MED ORDER — PANTOPRAZOLE SODIUM 40 MG PO TBEC
40.0000 mg | DELAYED_RELEASE_TABLET | Freq: Every day | ORAL | Status: DC
  Administered 2018-06-15: 40 mg via ORAL
  Filled 2018-06-14: qty 1

## 2018-06-14 MED ORDER — METOPROLOL TARTRATE 25 MG PO TABS
25.0000 mg | ORAL_TABLET | Freq: Two times a day (BID) | ORAL | Status: DC
  Administered 2018-06-14 – 2018-06-15 (×2): 25 mg via ORAL
  Filled 2018-06-14 (×2): qty 1

## 2018-06-14 MED ORDER — ENOXAPARIN SODIUM 40 MG/0.4ML ~~LOC~~ SOLN
40.0000 mg | SUBCUTANEOUS | Status: DC
  Administered 2018-06-14: 40 mg via SUBCUTANEOUS
  Filled 2018-06-14: qty 0.4

## 2018-06-14 NOTE — H&P (Signed)
Date: 06/14/2018               Patient Name:  Kyle Foster MRN: [id number]  DOB: Jan 19, 1946 Age / Sex: 72 y.o., male   PCP: Verline Lema, MD              Medical Service: Internal Medicine Teaching Service              Attending Physician: Dr. Laverta Baltimore Wonda Olds, MD    First Contact: Nena Polio, MS3 Pager: [phone]  Second Contact: Dr. Bobbe Medico Pager: [phone]  Third Contact Dr. Lorella Nimrod  Pager: [phone]       After Hours (After 5p/  First Contact Pager: [phone]  weekends / holidays): Second Contact Pager: [phone]   Chief Complaint: chest pain  History of Present Illness: Mr. Schiller is a 72 yo male with a past medical history of CAD s/p STEMI and 2 stents in 2009, HTN, HLD, CKD, and tobacco abuse, who presented from the [place] hospital to the ED today (10/07) with a history of upper left back pain and chest pain. Around two weeks ago after trimming limbs in his yard, he developed upper left back pain, and he thought he had pulled a muscle. He tried a muscle relaxer for this pain without relief. Around the same time, he also developed congestion, cough productive of yellowish sputum, and runny nose. Claritin has relieved these symptoms. On Monday (09/30), he developed chest pain that was radiating from his left back pain. He only has this chest pain when he is also having the back pain or when he coughs. He thinks the chest pain is related to coughing and his upper respiratory symptoms/allergies. He pinpoints the chest pain to just left of the sternum; it is not reproducible with palpation. This chest pain does not feel similar to his prior MI. He has tried Tylenol, which has relieved some of the chest pain. In the EMS, he was given nitroglycerin and aspirin without relief of chest pain.  He denies sick contacts, fevers, chills, weakness, fatigue, diaphoresis, sore throat, dyspnea, orthopnea, dyspepsia, abdominal pain, N/V, changes in bowel habits, and lower extremity swelling. He  has not had recent surgery. He traveled in a car to [place] around mid-September. However, he and his wife report that they took frequent breaks from riding in the car.  Meds: Current Facility-Administered Medications  Medication Dose Route Frequency Provider Last Rate Last Dose  . acetaminophen (TYLENOL) tablet 650 mg  650 mg Oral Q6H PRN Lorella Nimrod, MD       Or  . acetaminophen (TYLENOL) suppository 650 mg  650 mg Rectal Q6H PRN Lorella Nimrod, MD      . atorvastatin (LIPITOR) tablet 80 mg  80 mg Oral q1800 Lorella Nimrod, MD      . clopidogrel (PLAVIX) tablet 75 mg  75 mg Oral Daily Lorella Nimrod, MD      . dextromethorphan-guaiFENesin ([place] DM) 30-600 MG per 12 hr tablet 1 tablet  1 tablet Oral BID Lorella Nimrod, MD      . enoxaparin (LOVENOX) injection 40 mg  40 mg Subcutaneous Q24H Amin, Soundra Pilon, MD      . fluticasone (FLONASE) 50 MCG/ACT nasal spray 1 spray  1 spray Each Nare Daily Lorella Nimrod, MD      . Glycerin-Hypromellose-PEG 400 0.2-0.2-1 % SOLN 1 drop  1 drop Ophthalmic QHS Lorella Nimrod, MD      . ketorolac (TORADOL) 15 MG/ML injection 15 mg  15 mg  Intravenous Q6H PRN Lorella Nimrod, MD      . loratadine (CLARITIN) tablet 10 mg  10 mg Oral Daily Lorella Nimrod, MD      . metoprolol tartrate (LOPRESSOR) tablet 25 mg  25 mg Oral BID Lorella Nimrod, MD      . pantoprazole (PROTONIX) EC tablet 40 mg  40 mg Oral Daily Lorella Nimrod, MD      . polyethylene glycol (MIRALAX / GLYCOLAX) packet 17 g  17 g Oral Daily PRN Lorella Nimrod, MD      . sodium chloride (OCEAN) 0.65 % nasal spray 1 spray  1 spray Each Nare PRN Lorella Nimrod, MD      . sodium chloride flush (NS) 0.9 % injection 3 mL  3 mL Intravenous Q12H Lorella Nimrod, MD       Current Outpatient Medications  Medication Sig Dispense Refill  . acetaminophen (TYLENOL) 500 MG tablet Take 1,000 mg by mouth every 6 (six) hours as needed. PAIN     . atorvastatin (LIPITOR) 80 MG tablet Take 1 tablet (80 mg total) by mouth daily  at 6 PM. 90 tablet 3  . Cholecalciferol (VITAMIN D) 2000 UNITS tablet Take 2,000 Units by mouth 2 (two) times daily.    . clopidogrel (PLAVIX) 75 MG tablet TAKE 1 TABLET (75 MG TOTAL) BY MOUTH DAILY. KEEP APPT FOR 08/06/16 90 tablet 3  . Hypromellose (ARTIFICIAL TEARS OP) Apply 1 drop to eye at bedtime.    Marland Kitchen lisinopril (PRINIVIL,ZESTRIL) 5 MG tablet Take 1 tablet (5 mg total) by mouth daily. 90 tablet 3  . metoprolol tartrate (LOPRESSOR) 25 MG tablet Take 1 tablet (25 mg total) by mouth 2 (two) times daily. 180 tablet 3  . pantoprazole (PROTONIX) 40 MG tablet TAKE 1 TABLET (40 MG TOTAL) BY MOUTH DAILY. KEEP OV. 90 tablet 3  . clopidogrel (PLAVIX) 75 MG tablet TAKE 1 TABLET (75 MG TOTAL) BY MOUTH DAILY. KEEP APPT FOR 08/06/16 (Patient not taking: Reported on 06/14/2018) 90 tablet 3   Allergies: Codeine   Past Medical History:  Diagnosis Date  . CAD (coronary artery disease)   . 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 Chronic kidney disease    hx of bladder cancer 2/10 , kidney stones   . Hyperlipidemia   . Hypertension   . Myocardial infarction ([place])    06/2008  . Recurrent upper respiratory infection (URI)    09/01/11 saw PCP - Kathryne Eriksson , antibiotic  and prednisone    Past Surgical History:  Procedure Laterality Date  . CORONARY ANGIOPLASTY     coronary stents 06/2008   . CYSTOSCOPY W/ RETROGRADES  09/22/2011   Procedure: CYSTOSCOPY WITH RETROGRADE PYELOGRAM;  Surgeon: Bernestine Amass, MD;  Location: [facility];  Service: Urology;  Laterality: Left;  Cystoscopy left Retrograde Pyelogram      (c-arm)   . CYSTOSCOPY WITH BIOPSY  09/22/2011   Procedure: CYSTOSCOPY WITH BIOPSY;  Surgeon: Bernestine Amass, MD;  Location: [facility];  Service: Urology;  Laterality: N/A;   Biopsy  . EYE SURGERY     12/2007 right eye cataract surgery   . HERNIA REPAIR  02/2018  . NM MYOCAR PERF WALL MOTION  07/11/2008   MILD ISCHEMIA IN THE BASL INFERIOR, MID INFERIOR & APICAL INFERIOR REGIONS    . OTHER SURGICAL HISTORY     parotid gland surgery and left eye reconstructive, gold weight in eye lid 8/12   Family History: -  Mother - cancer; passed away with a heart attack in her mid-65s - Father - stroke in his late 25s - Brother - brain cancer  Social History: - Relationship: Married, lives with his wife - Tobacco: smoked 6-10 cigarettes per day for the past 50 years - Alcohol: denies use - Recreational drugs: denies use  Review of Systems: Pertinent items are noted in HPI.  Physical Exam: Blood pressure 122/74, pulse 78, temperature 98.6 F (37 C), temperature source Oral, resp. rate 17, height 6' (1.829 m), weight 68.9 kg, SpO2 97 %. General appearance: alert, cooperative, appears stated age and no distress Head: Appearance s/p parotid gland surgery and left eye reconstruction Eyes: left eye ptosis and undereye sagging; unable to fully close left eye due to prior surgery Lungs: clear to auscultation bilaterally Chest wall: no tenderness to palpation Heart: regular rate and rhythm and S1, S2 normal Abdomen: soft, non-tender; bowel sounds normal Extremities: no lower extremity edema MSK: no tenderness to palpation over the left trapezius or posterior deltoid  Lab results: Troponin: 0.00  K 3.1 Ca 7.5  Hgb 13.3 HCT 40.8 Platelets 162  Imaging results:  CXR 10/07 - I have reviewed this image. Impression: normal; no active cardiopulmonary disease  Other results: EKG 10/07 - I have reviewed this EKG. Normal sinus rhythm. Probable LA enlargement. No STEMI. Similar to prior EKG.  Assessment & Plan by Problem: Active Problems:   Chest pain  Mr. Seese is a 72 yo male with a history of CAD s/p STEMI and 2 stents placed in 2009, HTN, HLD, CKD, and tobacco abuse, who presented on 10/07 with upper left back pain and chest pain.   Chest pain: Mr. Mangan presented with a two week history of upper left back pain after trimming limbs and a one week history of chest pain  that is radiating from his shoulder. Our differential includes ACS, PE, AAA, and musculoskeletal cause. He has a history of CAD, stents, and prior STEMI, so ACS is high on our differential. However, his chest pain is not typical for ACS, as it is in a pinpoint location to the left of the sternum, only occurs in conjunction with his back pain or with coughing, and is not relieved by nitroglycerin. His troponin is 0. His EKG was negative for STEMI and was similar to his most recent EKG. Due to concern for ACS, we will continue trending troponin and cardiac monitoring. He has a normal CXR, is on Plavix, and has not had significant recent immobilization, making PE less likely. Though he has risk factors for AAA, he is not presenting acutely and does not describe typical symptoms associated with AAA or aortic dissection. The cause is most likely musculoskeletal in nature, in the setting of his upper left back pain after doing strenuous yard work. He can have Tylenol for mild pain or IV Toradol for more severe pain. - Continue trending troponin and cardiac monitoring  - Tylenol 650 mg q6h prn mild pain - IV Toradol 15 mg q6h prn severe pain - Continue home Plavix 75 mg qd  URI symptoms: Mr. Konecny is also complaining of a two week history of rhinorrhea, congestion, and cough productive of yellowish sputum. He is afebrile and CXR is clear. We will give him Mucinex, claritin, and saline nasal spray for symptomatic relief.  Hyperlipidemia: Continue home atorvastatin 80 mg qd   Hypertension: Continue home metoprolol 25 mg BID. Hold home lisinopril for now as BP under control. He also notes having a dry  tickle in his throat since starting lisinopril. Advised him to discuss this with his PCP. - Continue monitoring BP  GERD: Continue home protonix 40 mg qd  DVT ppx: subq lovenox 40 mg qd Diet: heart healthy Code: full   This is a Careers information officer Note.  The care of the patient was discussed with Dr. Myrtie Hawk and  the assessment and plan was formulated with their assistance.  Please see their note for official documentation of the patient encounter.   Signed: Dimas Millin, Medical Student 06/14/2018, 5:28 PM   I have seen and examined the patient myself, and I have reviewed the note by Nena Polio, MS and was present during the interview and physical exam.     Signed: Dewayne Hatch, MD 06/14/2018, 7:25 PM

## 2018-06-14 NOTE — ED Provider Notes (Signed)
[facility] EMERGENCY DEPARTMENT Provider Note   CSN: [id number] Arrival date & time: 06/14/18  1318     History   Chief Complaint Chief Complaint  Patient presents with  . Chest Pain    HPI Kyle Foster is a 72 y.o. male with CAD s/p STEMI and 2 stents in 2009, CKD, HTN, HLD, tobacco use who presents with a two week history of left upper back pain radiating to his chest. He states that we was trimming limbs two weeks ago and thought he pulled a muscle in his back. The following day he began experiencing a sharp pain in his left upper back between his spine and shoulder blade that occasionally radiates to his chest. Flexeril did not help. Tylenol and aspirin provided some relief. Nitroglycerin did not help. The chest pain is located just to the left of the sternum and described as an ache. Reports occasional shooting pains towards the left shoulder but denies radiation down the arm or up the neck/jaw. Denies associated nausea, vomiting, diaphoresis, or numbness.   He also notes two weeks of congestion, cough productive of yellow mucous, rhinorrhea, and chills and left lateral rib pain that is worse with coughing.   He follows with cardiology and last saw them in Nov 2018.    ROS is negative for sob, doe, lower extremity swelling, abdominal pain, diarrhea.   HPI  Past Medical History:  Diagnosis Date  . CAD (coronary artery disease)   . 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 Chronic kidney disease    hx of bladder cancer 2/10 , kidney stones   . Hyperlipidemia   . Hypertension   . Myocardial infarction ([place])    06/2008  . Recurrent upper respiratory infection (URI)    09/01/11 saw PCP - Kathryne Eriksson , antibiotic  and prednisone     Patient Active Problem List   Diagnosis Date Noted  . Coronary artery disease 07/13/2013  . Essential hypertension 07/13/2013  . Hyperlipidemia 07/13/2013  . Tobacco abuse 07/13/2013  . 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 CORONARY ANGIOPLASTY     coronary stents 06/2008   . CYSTOSCOPY W/ RETROGRADES  09/22/2011   Procedure: CYSTOSCOPY WITH RETROGRADE PYELOGRAM;  Surgeon: Bernestine Amass, MD;  Location: [facility];  Service: Urology;  Laterality: Left;  Cystoscopy left Retrograde Pyelogram      (c-arm)   . CYSTOSCOPY WITH BIOPSY  09/22/2011   Procedure: CYSTOSCOPY WITH BIOPSY;  Surgeon: Bernestine Amass, MD;  Location: [facility];  Service: Urology;  Laterality: N/A;   Biopsy  . EYE SURGERY     12/2007 right eye cataract surgery   . NM MYOCAR PERF WALL MOTION  07/11/2008   MILD ISCHEMIA IN THE BASL INFERIOR, MID INFERIOR & APICAL INFERIOR REGIONS  . OTHER SURGICAL HISTORY     parotid gland surgery and left eye reconstructive, gold weight in eye lid 8/12        Home Medications    Prior to Admission medications   Medication Sig Start Date End Date Taking? Authorizing Provider  acetaminophen (TYLENOL) 500 MG tablet Take 1,000 mg by mouth every 6 (six) hours as needed. PAIN     [provider]  atorvastatin (LIPITOR) 80 MG tablet Take 1 tablet (80 mg total) by mouth daily at 6 PM. 10/22/17   Lorretta Harp, MD  Cholecalciferol (VITAMIN D)  2000 UNITS tablet Take 2,000 Units by mouth 2 (two) times daily.    [provider]  clopidogrel (PLAVIX) 75 MG tablet TAKE 1 TABLET (75 MG TOTAL) BY MOUTH DAILY. KEEP APPT FOR 08/06/16 10/26/17   Lorretta Harp, MD  clopidogrel (PLAVIX) 75 MG tablet TAKE 1 TABLET (75 MG TOTAL) BY MOUTH DAILY. KEEP APPT FOR 08/06/16 10/26/17   Lorretta Harp, MD  Hypromellose (ARTIFICIAL TEARS OP) Apply 1 drop to eye at bedtime.    [provider]  lisinopril (PRINIVIL,ZESTRIL) 5 MG tablet Take 1 tablet (5 mg total) by mouth daily. 08/28/17   Lorretta Harp, MD  metoprolol tartrate (LOPRESSOR) 25 MG tablet Take 1 tablet (25 mg total) by mouth 2 (two) times daily. 09/21/17    Lorretta Harp, MD  pantoprazole (PROTONIX) 40 MG tablet TAKE 1 TABLET (40 MG TOTAL) BY MOUTH DAILY. KEEP OV. 12/14/17   Lorretta Harp, MD    Family History Family History  Problem Relation Age of Onset  . Heart attack Mother   . Stroke Father   . Brain cancer Brother     Social History Social History   Tobacco Use  . Smoking status: Current Every Day Smoker    Packs/day: 0.50    Years: 40.00    Pack years: 20.00    Types: Cigarettes  . Smokeless tobacco: Never Used  Substance Use Topics  . Alcohol use: No  . Drug use: No     Allergies   Codeine   Review of Systems Review of Systems See HPI  Physical Exam Updated Vital Signs BP 120/65   Pulse 73   Temp 98.6 F (37 C) (Oral)   Resp (!) 23   Ht 6' (1.829 m)   Wt 68.9 kg   SpO2 99%   BMI 20.60 kg/m   Physical Exam Vitals:   06/14/18 1330 06/14/18 1351 06/14/18 1400 06/14/18 1445  BP: 116/63 137/63 120/73 120/65  Pulse: 75 75 77 73  Resp: (!) 29 18 17  (!) 23  Temp:  98.6 F (37 C)    TempSrc:  Oral    SpO2: 97% 97% 97% 99%  Weight: 68.9 kg     Height: 6' (1.829 m)      General: Vital signs reviewed.  Patient is well-developed and well-nourished, in no acute distress and cooperative with exam.  Neck: Supple, trachea midline, normal ROM, no JVD or carotid bruit present.  Cardiovascular: RRR, S1 normal, S2 normal, no murmurs, gallops, or rubs. Pulmonary/Chest: Clear to auscultation bilaterally, no wheezes, rales, or rhonchi. No tenderness to palpation over chest wall Abdominal: Soft, non-tender, non-distended Musculoskeletal: Full active and passive ROM of left shoulder with pain. Left upper back TTP between scapula and spine, no erythema, swelling or palpable nodules.  Extremities: No lower extremity edema bilaterally,  pulses symmetric and intact bilaterally.  Neurological: A&O x3, Strength is normal and symmetric bilaterally, cranial nerve II-XII are grossly intact, no focal motor deficit, sensory  intact to light touch bilaterally.  Psychiatric: Normal mood and affect. speech and behavior is normal. Cognition and memory are normal.    ED Treatments / Results  Labs (all labs ordered are listed, but only abnormal results are displayed) Labs Reviewed  COMPREHENSIVE METABOLIC PANEL - Abnormal; Notable for the following components:      Result Value   Potassium 3.1 (*)    Chloride 113 (*)    Calcium 7.5 (*)    Total Protein 4.9 (*)    Albumin  2.6 (*)    AST 14 (*)    All other components within normal limits  CBC WITH DIFFERENTIAL/PLATELET - Abnormal; Notable for the following components:   RBC 3.38 (*)    Hemoglobin 10.7 (*)    HCT 33.4 (*)    Platelets 129 (*)    All other components within normal limits  I-STAT TROPONIN, ED    EKG EKG Interpretation  Date/Time:  Monday June 14 2018 13:23:01 EDT Ventricular Rate:  71 PR Interval:    QRS Duration: 135 QT Interval:  409 QTC Calculation: 445 R Axis:   -32 Text Interpretation:  Sinus rhythm Short PR interval Probable left atrial enlargement Right bundle branch block No STEMI. Similar to prior tracing.  Confirmed by Nanda Quinton [phone]) on 06/14/2018 1:31:45 PM   Radiology Dg Chest 2 View  Result Date: 06/14/2018 CLINICAL DATA:  Chest pain. EXAM: CHEST - 2 VIEW COMPARISON:  Radiographs of September 15, 2011. FINDINGS: The heart size and mediastinal contours are within normal limits. Both lungs are clear. No pneumothorax or pleural effusion is noted. The visualized skeletal structures are unremarkable. IMPRESSION: No active cardiopulmonary disease. Electronically Signed   By: Marijo Conception, M.D.   On: 06/14/2018 14:44    Procedures Procedures (including critical care time)  Medications Ordered in ED Medications - No data to display   Initial Impression / Assessment and Plan / ED Course  I have reviewed the triage vital signs and the nursing notes.  Pertinent labs & imaging results that were available during my  care of the patient were reviewed by me and considered in my medical decision making (see chart for details).   72 year old male with CAD s/p STEMI and 2 stents in 2009, CKD, HTN, HLD, tobacco use who presents with a two week history of left upper back pain radiating to his chest which began after trimming tree limbs. He also notes two weeks of upper respiratory infectious symptoms.   Given 325mg  ASA and sublingual nitro x 2 by EMS with minimal improvement of chest pain.  Chest pain does not sound typical for ACS but given his prior cardiac history I have low threshold for cardiac workup. Unlikely PE given no risk factors such as recent long travel or surgeries and he is on plavix. Pain is not consistent with aortic dissection. Consider MSK given timeline of symptoms preceded by yard work. Consider pneumonia vs bronchitis but lungs were CTAB on exam. Will get basic labs, EKG, and CXR  EKG similar to prior without new ischemic changes. Initial troponin undetectable.   CBC notable for hemoglobin 10.7, plts 129. Denies BRB per rectum or melena   CXR negative for acute cardiopulmonary disease.   Will admit for cardiac workup.   Final Clinical Impressions(s) / ED Diagnoses   Final diagnoses:  Atypical chest pain    ED Discharge Orders    None       Isabelle Course, MD 06/14/18 1515    Margette Fast, MD 06/14/18 2117

## 2018-06-14 NOTE — ED Triage Notes (Signed)
Pt arrives to ED from the [place] with complaints of left shoulder tightness radiating to his chest since 2 weeks ago. EMS reports pt was given flexeril with no relief, transferred here today per pt request, states his cardiologist is here at [facility]. Pt had a stent placed here in 2011. 2/10 chest tightness after EMS gave 3 nitro and 324 asa en route, reduced from 5/10. Pt is on plavix. Pt placed in position of comfort with bed locked and lowered, call bell in reach.

## 2018-06-15 ENCOUNTER — Encounter (HOSPITAL_COMMUNITY): Payer: Self-pay

## 2018-06-15 ENCOUNTER — Observation Stay (HOSPITAL_COMMUNITY): Payer: Medicare Other

## 2018-06-15 DIAGNOSIS — I252 Old myocardial infarction: Secondary | ICD-10-CM | POA: Diagnosis not present

## 2018-06-15 DIAGNOSIS — I2510 Atherosclerotic heart disease of native coronary artery without angina pectoris: Secondary | ICD-10-CM

## 2018-06-15 DIAGNOSIS — I129 Hypertensive chronic kidney disease with stage 1 through stage 4 chronic kidney disease, or unspecified chronic kidney disease: Secondary | ICD-10-CM

## 2018-06-15 DIAGNOSIS — R079 Chest pain, unspecified: Secondary | ICD-10-CM | POA: Diagnosis not present

## 2018-06-15 DIAGNOSIS — E785 Hyperlipidemia, unspecified: Secondary | ICD-10-CM | POA: Diagnosis not present

## 2018-06-15 DIAGNOSIS — Z885 Allergy status to narcotic agent status: Secondary | ICD-10-CM

## 2018-06-15 DIAGNOSIS — I2583 Coronary atherosclerosis due to lipid rich plaque: Secondary | ICD-10-CM | POA: Diagnosis not present

## 2018-06-15 DIAGNOSIS — X58XXXA Exposure to other specified factors, initial encounter: Secondary | ICD-10-CM | POA: Diagnosis not present

## 2018-06-15 DIAGNOSIS — Z720 Tobacco use: Secondary | ICD-10-CM | POA: Diagnosis not present

## 2018-06-15 DIAGNOSIS — R0789 Other chest pain: Secondary | ICD-10-CM | POA: Diagnosis not present

## 2018-06-15 DIAGNOSIS — J209 Acute bronchitis, unspecified: Secondary | ICD-10-CM | POA: Diagnosis present

## 2018-06-15 DIAGNOSIS — M546 Pain in thoracic spine: Secondary | ICD-10-CM | POA: Diagnosis not present

## 2018-06-15 DIAGNOSIS — Z79899 Other long term (current) drug therapy: Secondary | ICD-10-CM

## 2018-06-15 DIAGNOSIS — K219 Gastro-esophageal reflux disease without esophagitis: Secondary | ICD-10-CM

## 2018-06-15 DIAGNOSIS — S29012A Strain of muscle and tendon of back wall of thorax, initial encounter: Secondary | ICD-10-CM | POA: Diagnosis not present

## 2018-06-15 DIAGNOSIS — Z955 Presence of coronary angioplasty implant and graft: Secondary | ICD-10-CM

## 2018-06-15 DIAGNOSIS — N189 Chronic kidney disease, unspecified: Secondary | ICD-10-CM

## 2018-06-15 LAB — TROPONIN I: Troponin I: <0.03 ng/mL (ref <0.03)

## 2018-06-15 LAB — NM MYOCAR MULTI W/SPECT W/WALL MOTION / EF
CHL CUP MPHR: 150 {beats}/min
Estimated workload: 1 METS
Peak HR: 104 {beats}/min
Percent HR: 69 %
Rest HR: 73 {beats}/min

## 2018-06-15 LAB — BASIC METABOLIC PANEL
ANION GAP: 7 (ref 5–15)
BUN: 18 mg/dL (ref 8–23)
CALCIUM: 8.9 mg/dL (ref 8.9–10.3)
CO2: 25 mmol/L (ref 22–32)
Chloride: 109 mmol/L (ref 98–111)
Creatinine, Ser: 0.98 mg/dL (ref 0.61–1.24)
Glucose, Bld: 110 mg/dL — ABNORMAL HIGH (ref 70–99)
Potassium: 3.7 mmol/L (ref 3.5–5.1)
Sodium: 141 mmol/L (ref 135–145)

## 2018-06-15 LAB — GLUCOSE, CAPILLARY: Glucose-Capillary: 83 mg/dL (ref 70–99)

## 2018-06-15 LAB — HIV ANTIBODY (ROUTINE TESTING W REFLEX): HIV Screen 4th Generation wRfx: NONREACTIVE

## 2018-06-15 MED ORDER — DM-GUAIFENESIN ER 30-600 MG PO TB12: 1.0000 | ORAL_TABLET | Freq: Two times a day (BID) | ORAL | 0 refills | Status: DC

## 2018-06-15 MED ORDER — PREDNISONE 10 MG PO TABS: 30.0000 mg | ORAL_TABLET | Freq: Every day | ORAL | 0 refills | Status: AC

## 2018-06-15 MED ORDER — FLUTICASONE PROPIONATE 50 MCG/ACT NA SUSP: 1.0000 | Freq: Every day | NASAL | 0 refills | Status: DC

## 2018-06-15 MED ORDER — SALINE SPRAY 0.65 % NA SOLN: 1.0000 | NASAL | 2 refills | Status: DC | PRN

## 2018-06-15 MED ORDER — REGADENOSON 0.4 MG/5ML IV SOLN
INTRAVENOUS | Status: AC
  Filled 2018-06-15: qty 5

## 2018-06-15 MED ORDER — POTASSIUM CHLORIDE CRYS ER 10 MEQ PO TBCR: 10.0000 meq | EXTENDED_RELEASE_TABLET | Freq: Two times a day (BID) | ORAL | Status: DC

## 2018-06-15 MED ORDER — LORATADINE 10 MG PO TABS: 10.0000 mg | ORAL_TABLET | Freq: Every day | ORAL | 2 refills | Status: DC

## 2018-06-15 MED ORDER — TECHNETIUM TC 99M TETROFOSMIN IV KIT
30.0000 | PACK | Freq: Once | INTRAVENOUS | Status: AC | PRN
  Administered 2018-06-15: 30 via INTRAVENOUS

## 2018-06-15 MED ORDER — TECHNETIUM TC 99M TETROFOSMIN IV KIT
10.0000 | PACK | Freq: Once | INTRAVENOUS | Status: AC | PRN
  Administered 2018-06-15: 10 via INTRAVENOUS

## 2018-06-15 MED ORDER — REGADENOSON 0.4 MG/5ML IV SOLN
0.4000 mg | Freq: Once | INTRAVENOUS | Status: AC
  Administered 2018-06-15: 0.4 mg via INTRAVENOUS

## 2018-06-15 NOTE — Progress Notes (Signed)
1 day lexiscan myoview completed, pending result this afternoon by [facility] radiology.   Hilbert Corrigan PA Pager: [phone]

## 2018-06-15 NOTE — Progress Notes (Signed)
Subjective: This morning, Kyle Foster reports feeling about the same as yesterday. Early this morning, he was having more severe upper left back pain, so he took some IV toradol, which relieved his pain. He continues to have productive cough and congestion, despite trying mucinex and saline nasal spray.  Objective: Vital signs in last 24 hours: Vitals:   06/15/18 1053 06/15/18 1055 06/15/18 1057 06/15/18 1233  BP: (!) 142/81 (!) 157/80 (!) 162/80 134/68  Pulse: 99 (!) 102 100 86  Resp:    16  Temp:    97.7 F (36.5 C)  TempSrc:    Oral  SpO2:    97%  Weight:      Height:       No intake or output data in the 24 hours ending 06/15/18 1446   General appearance: alert, cooperative, appears stated age and no distress Lungs: clear to auscultation bilaterally Heart: regular rate and rhythm and S1, S2 normal Abdomen: soft, non-tender; bowel sounds normal   Lab Results: Troponin trends q6h: negative x3  K 3.7  Micro Results: No results found for this or any previous visit (from the past 240 hour(s)).   Studies/Results: Stress test 10/08 showed low risk stratification with no reversible ischemia or infarction, normal LV wall motion, and LV EF 50%.  CXR 10/07 - I have reviewed this image. No active cardiopulmonary disease.   Medications: I have reviewed the patient's current medications. Scheduled Meds: . atorvastatin  80 mg Oral q1800  . clopidogrel  75 mg Oral Daily  . dextromethorphan-guaiFENesin  1 tablet Oral BID  . enoxaparin (LOVENOX) injection  40 mg Subcutaneous Q24H  . fluticasone  1 spray Each Nare Daily  . Influenza vac split quadrivalent PF  0.5 mL Intramuscular Tomorrow-1000  . loratadine  10 mg Oral Daily  . metoprolol tartrate  25 mg Oral BID  . pantoprazole  40 mg Oral Daily  . polyvinyl alcohol  1 drop Both Eyes QHS  . regadenoson      . sodium chloride flush  3 mL Intravenous Q12H   Continuous Infusions: PRN Meds:.acetaminophen **OR** acetaminophen,  ketorolac, polyethylene glycol, sodium chloride Assessment/Plan: Active Problems:   Chest pain  [name] is a 72 year old male with a history of CAD s/p STEMI (2009) and 3 stents (2009), HTN, HLD, CKD, and tobacco abuse, who presented on 10/07 with chest pain and upper left back pain.   Chest pain: His chest pain is most likely musculoskeletal in nature, and his pain was relieved with IV toradol this morning. We stopped trending troponins, since they have been negative x3. He was seen by cardiology, and due to his risk factors, he had a stress test. His stress test revealed low risk stratification with no reversible ischemia or infarction, normal LV wall motion, and LV EF 50%. - Continue cardiac monitoring  - Tylenol 650 mg q6h prn mild pain - IV Toradol 15 mg q6h prn severe pain - Continue home Plavix 75 mg qd  URI symptoms: Kyle Foster is concerned about his upper respiratory symptoms. He has not had relief with mucinex, claritin, and saline nasal spray. He is requesting a steroid pack, as this is what his PCP used to give him for these symptoms. We will discharge him with prednisone 30 mg daily for 5 days.  Hypertension: His blood pressure remained well-controlled on his home metoprolol. We advised Mr. Abt to talk to his PCP about the dry tickle in his throat that he has had since being  on lisinopril.   DVT ppx: subq lovenox 40 mg qd Diet: after stress test, heart healthy diet Code: full  This is a Careers information officer Note.  The care of the patient was discussed with Dr. Myrtie Hawk and the assessment and plan formulated with their assistance.  Please see their attached note for official documentation of the daily encounter.   LOS: 0 days   Dimas Millin, Medical Student 06/15/2018, 2:48 PM  I have seen and examined the patient myself, and I have reviewed the note by Nena Polio, MS 3 and was present during the interview and physical exam.    Signed: Dewayne Hatch,  MD 06/15/2018, 4:45 PM
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
## 2018-06-15 NOTE — Progress Notes (Signed)
Patient given discharge instructions and verbalized understanding.  Patient stable to discharge at this time.  Patient is alert and oriented to baseline.  No distressed noted at this time.  All belongings taken with the patient at discharge.   

## 2018-06-15 NOTE — Plan of Care (Signed)
Pt verbalized understanding plan of care. Pt understands cardiac disease and CV risk reduction. Pt able to tolerate activity. Pt's pain being managed with meds. Pt progressing towards discharge.

## 2018-06-15 NOTE — Discharge Summary (Signed)
Name: Kyle Foster MRN: [id number] DOB: 19-Jun-1946 72 y.o. PCP: [facility]  Date of Admission: 06/14/2018  1:18 PM Date of Discharge: 06/15/2018 06/15/18 Attending Physician: No att. providers found  Discharge Diagnosis: 1. Chest pain 2. Rhomboid muscle strain 3. Acute bronchitis  Discharge Medications: Allergies as of 06/15/2018      Reactions   Codeine Other (See Comments)   HEADACHE headaches      Medication List    TAKE these medications   acetaminophen 500 MG tablet Commonly known as:  TYLENOL Take 1,000 mg by mouth every 6 (six) hours as needed. PAIN   ARTIFICIAL TEARS OP Apply 1 drop to eye at bedtime.   atorvastatin 80 MG tablet Commonly known as:  LIPITOR Take 1 tablet (80 mg total) by mouth daily at 6 PM.   clopidogrel 75 MG tablet Commonly known as:  PLAVIX TAKE 1 TABLET (75 MG TOTAL) BY MOUTH DAILY. KEEP APPT FOR 08/06/16   clopidogrel 75 MG tablet Commonly known as:  PLAVIX TAKE 1 TABLET (75 MG TOTAL) BY MOUTH DAILY. KEEP APPT FOR 08/06/16   dextromethorphan-guaiFENesin 30-600 MG 12hr tablet Commonly known as:  MUCINEX DM Take 1 tablet by mouth 2 (two) times daily.   fluticasone 50 MCG/ACT nasal spray Commonly known as:  FLONASE Place 1 spray into both nostrils daily.   lisinopril 5 MG tablet Commonly known as:  PRINIVIL,ZESTRIL Take 1 tablet (5 mg total) by mouth daily.   loratadine 10 MG tablet Commonly known as:  CLARITIN Take 1 tablet (10 mg total) by mouth daily. Start taking on:  06/16/2018   metoprolol tartrate 25 MG tablet Commonly known as:  LOPRESSOR Take 1 tablet (25 mg total) by mouth 2 (two) times daily.   pantoprazole 40 MG tablet Commonly known as:  PROTONIX TAKE 1 TABLET (40 MG TOTAL) BY MOUTH DAILY. KEEP OV.   predniSONE 10 MG tablet Commonly known as:  DELTASONE Take 3 tablets (30 mg total) by mouth daily with breakfast for 5 days.   sodium chloride 0.65 % Soln nasal spray Commonly known as:   OCEAN Place 1 spray into both nostrils as needed for congestion.   Vitamin D 2000 units tablet Take 2,000 Units by mouth 2 (two) times daily.       Disposition and follow-up:   Mr.Kyle Foster was discharged from [facility] in Stable condition.  At the hospital follow up visit please address:  1.  Patient noted with light chest pain, and upper left back pain with moving his upper extremity, found to be musculoskeletal (secondary to rhomboid muscle strain) also had mild symptoms suggestive of viral upper respiratory infection.  Please address if he develops any similar pain but concerning for coronary syndrome.  2.  Labs / imaging needed at time of follow-up: BMP (He had hypokalemia that was resolved with potassium supplementation at hospital)  3.  Pending labs/ test needing follow-up: none  Follow-up Appointments:    Hospital Course by problem list: [name] is a 72 year old male with a history of CAD s/p STEMI (2009) and 3 stents (2009), HTN, HLD, CKD, and tobacco abuse, who presented on 10/07 with a chief complaint of chest pain, as well as a two week history of upper left back pain and upper respiratory symptoms.   1. Chest pain: [name] presented with a two week history of upper left back pain after trimming limbs and a one week history of chest pain that was radiating  from his shoulder. He has a history of CAD, prior STEMI, and stents, so ACS was high on our differential. However, his chest pain was not typical for ACS, as it was in a localized area to the left of the sternum, only occured in conjunction with his back pain or with coughing, and was not relieved by nitroglycerin. His troponin was negative x3. His EKG did not show acute ST-T changes and was similar to his most recent EKG. He had a normal CXR. The cause is most likely musculoskeletal in nature, in the setting of his upper left back pain after doing strenuous yard work. Cardiology recommended a  stress test, due to his history. Stress test revealed low risk stratification with no reversible ischemia or infarction, normal LV wall motion, and LV EF 50%.  2. URI symptoms: [name] also reported a two week history of rhinorrhea, congestion, and cough productive of yellowish sputum. He was afebrile without leukocytosis and CXR was clear. He has had recurrent viral URIs in the past and reports being treated successfully with prednisone. He will be discharged with prednisone 30 mg daily for 5 days, and was advised to use Mucinex DM, saline nasal spray, and flonase as needed.  3. Hypertension: [name] BP remained controlled on his home metoprolol. We held his home lisinopril. He notes having a dry tickle in his throat since starting lisinopril, and we advised him to discuss this with his PCP.  4. Continued his home medication for hyperlipidemia and GERD.  Discharge Vitals:   BP 140/66 (BP Location: Left Arm)   Pulse 88   Temp 97.8 F (36.6 C) (Oral)   Resp 16   Ht 6' (1.829 m)   Wt 68.9 kg   SpO2 95%   BMI 20.60 kg/m   Pertinent Labs, Studies, and Procedures:  CXR 10/07 - I have reviewed this image. No active cardiopulmonary disease.   Stress test 10/08 showed low risk stratification with no reversible ischemia or infarction, normal LV wall motion, and LV EF 50%.  Troponin trends: negative x3  Discharge Instructions: Discharge Instructions    Diet - low sodium heart healthy   Complete by:  As directed    Discharge instructions   Complete by:  As directed    It was pleasure taking care of you. I am giving you a short course of prednisone, you will take 30 mg daily for 5 days. You can continue taking Mucinex DM and Flonase along with some saline nasal spray until your symptoms resolve. If you develop fever or worsening of your symptoms please contact your primary care. Please follow-up with cardiology according to your scheduled appointment.   Increase activity slowly    Complete by:  As directed       Signed: Dewayne Hatch, MD 06/15/2018, 4:44 PM   Pager: [phone]

## 2018-07-08 ENCOUNTER — Other Ambulatory Visit: Payer: Self-pay | Admitting: Internal Medicine

## 2018-08-16 DIAGNOSIS — Z08 Encounter for follow-up examination after completed treatment for malignant neoplasm: Secondary | ICD-10-CM | POA: Diagnosis not present

## 2018-08-16 DIAGNOSIS — Z85818 Personal history of malignant neoplasm of other sites of lip, oral cavity, and pharynx: Secondary | ICD-10-CM | POA: Diagnosis not present

## 2018-08-16 DIAGNOSIS — Z85858 Personal history of malignant neoplasm of other endocrine glands: Secondary | ICD-10-CM | POA: Diagnosis not present

## 2018-09-07 ENCOUNTER — Other Ambulatory Visit: Payer: Self-pay | Admitting: Cardiovascular Disease

## 2018-09-15 ENCOUNTER — Other Ambulatory Visit: Payer: Self-pay | Admitting: Cardiovascular Disease

## 2018-10-09 DIAGNOSIS — C3490 Malignant neoplasm of unspecified part of unspecified bronchus or lung: Secondary | ICD-10-CM

## 2018-10-09 HISTORY — DX: Malignant neoplasm of unspecified part of unspecified bronchus or lung: C34.90

## 2018-10-14 ENCOUNTER — Encounter (HOSPITAL_COMMUNITY): Payer: Self-pay

## 2018-10-14 ENCOUNTER — Emergency Department (HOSPITAL_COMMUNITY): Payer: Medicare Other

## 2018-10-14 ENCOUNTER — Inpatient Hospital Stay (HOSPITAL_COMMUNITY)
Admission: EM | Admit: 2018-10-14 | Discharge: 2018-10-22 | DRG: 181 | Disposition: A | Discharge status: Home / Self Care | Payer: Medicare Other | Attending: Family Medicine | Admitting: Family Medicine

## 2018-10-14 ENCOUNTER — Other Ambulatory Visit: Payer: Self-pay

## 2018-10-14 DIAGNOSIS — E785 Hyperlipidemia, unspecified: Secondary | ICD-10-CM | POA: Diagnosis present

## 2018-10-14 DIAGNOSIS — I2510 Atherosclerotic heart disease of native coronary artery without angina pectoris: Secondary | ICD-10-CM | POA: Diagnosis present

## 2018-10-14 DIAGNOSIS — Z515 Encounter for palliative care: Secondary | ICD-10-CM | POA: Diagnosis not present

## 2018-10-14 DIAGNOSIS — R918 Other nonspecific abnormal finding of lung field: Secondary | ICD-10-CM | POA: Diagnosis present

## 2018-10-14 DIAGNOSIS — Z7189 Other specified counseling: Secondary | ICD-10-CM

## 2018-10-14 DIAGNOSIS — E875 Hyperkalemia: Secondary | ICD-10-CM | POA: Diagnosis present

## 2018-10-14 DIAGNOSIS — Z9889 Other specified postprocedural states: Secondary | ICD-10-CM

## 2018-10-14 DIAGNOSIS — G893 Neoplasm related pain (acute) (chronic): Secondary | ICD-10-CM | POA: Diagnosis not present

## 2018-10-14 DIAGNOSIS — Z7902 Long term (current) use of antithrombotics/antiplatelets: Secondary | ICD-10-CM | POA: Diagnosis not present

## 2018-10-14 DIAGNOSIS — Z85818 Personal history of malignant neoplasm of other sites of lip, oral cavity, and pharynx: Secondary | ICD-10-CM

## 2018-10-14 DIAGNOSIS — C7989 Secondary malignant neoplasm of other specified sites: Secondary | ICD-10-CM | POA: Diagnosis not present

## 2018-10-14 DIAGNOSIS — C7951 Secondary malignant neoplasm of bone: Secondary | ICD-10-CM | POA: Diagnosis not present

## 2018-10-14 DIAGNOSIS — F17210 Nicotine dependence, cigarettes, uncomplicated: Secondary | ICD-10-CM | POA: Diagnosis present

## 2018-10-14 DIAGNOSIS — Z955 Presence of coronary angioplasty implant and graft: Secondary | ICD-10-CM

## 2018-10-14 DIAGNOSIS — Z923 Personal history of irradiation: Secondary | ICD-10-CM

## 2018-10-14 DIAGNOSIS — R590 Localized enlarged lymph nodes: Secondary | ICD-10-CM | POA: Diagnosis not present

## 2018-10-14 DIAGNOSIS — Z79899 Other long term (current) drug therapy: Secondary | ICD-10-CM | POA: Diagnosis not present

## 2018-10-14 DIAGNOSIS — M546 Pain in thoracic spine: Secondary | ICD-10-CM | POA: Diagnosis not present

## 2018-10-14 DIAGNOSIS — Z681 Body mass index (BMI) 19 or less, adult: Secondary | ICD-10-CM

## 2018-10-14 DIAGNOSIS — I10 Essential (primary) hypertension: Secondary | ICD-10-CM | POA: Diagnosis present

## 2018-10-14 DIAGNOSIS — E43 Unspecified severe protein-calorie malnutrition: Secondary | ICD-10-CM

## 2018-10-14 DIAGNOSIS — C689 Malignant neoplasm of urinary organ, unspecified: Secondary | ICD-10-CM | POA: Diagnosis present

## 2018-10-14 DIAGNOSIS — Z87442 Personal history of urinary calculi: Secondary | ICD-10-CM | POA: Diagnosis not present

## 2018-10-14 DIAGNOSIS — C799 Secondary malignant neoplasm of unspecified site: Secondary | ICD-10-CM

## 2018-10-14 DIAGNOSIS — C7802 Secondary malignant neoplasm of left lung: Principal | ICD-10-CM | POA: Diagnosis present

## 2018-10-14 DIAGNOSIS — Z808 Family history of malignant neoplasm of other organs or systems: Secondary | ICD-10-CM

## 2018-10-14 DIAGNOSIS — C3412 Malignant neoplasm of upper lobe, left bronchus or lung: Secondary | ICD-10-CM | POA: Diagnosis present

## 2018-10-14 DIAGNOSIS — R072 Precordial pain: Secondary | ICD-10-CM

## 2018-10-14 DIAGNOSIS — C4492 Squamous cell carcinoma of skin, unspecified: Secondary | ICD-10-CM | POA: Diagnosis present

## 2018-10-14 DIAGNOSIS — E782 Mixed hyperlipidemia: Secondary | ICD-10-CM | POA: Diagnosis present

## 2018-10-14 DIAGNOSIS — E440 Moderate protein-calorie malnutrition: Secondary | ICD-10-CM | POA: Diagnosis present

## 2018-10-14 DIAGNOSIS — R7989 Other specified abnormal findings of blood chemistry: Secondary | ICD-10-CM | POA: Diagnosis not present

## 2018-10-14 DIAGNOSIS — I252 Old myocardial infarction: Secondary | ICD-10-CM | POA: Diagnosis not present

## 2018-10-14 DIAGNOSIS — R222 Localized swelling, mass and lump, trunk: Secondary | ICD-10-CM | POA: Diagnosis not present

## 2018-10-14 DIAGNOSIS — Z720 Tobacco use: Secondary | ICD-10-CM | POA: Diagnosis present

## 2018-10-14 DIAGNOSIS — Z8551 Personal history of malignant neoplasm of bladder: Secondary | ICD-10-CM | POA: Diagnosis not present

## 2018-10-14 DIAGNOSIS — Z8249 Family history of ischemic heart disease and other diseases of the circulatory system: Secondary | ICD-10-CM | POA: Diagnosis not present

## 2018-10-14 DIAGNOSIS — Z85858 Personal history of malignant neoplasm of other endocrine glands: Secondary | ICD-10-CM | POA: Diagnosis not present

## 2018-10-14 DIAGNOSIS — R0789 Other chest pain: Secondary | ICD-10-CM | POA: Diagnosis present

## 2018-10-14 DIAGNOSIS — C3492 Malignant neoplasm of unspecified part of left bronchus or lung: Secondary | ICD-10-CM | POA: Diagnosis not present

## 2018-10-14 DIAGNOSIS — C089 Malignant neoplasm of major salivary gland, unspecified: Secondary | ICD-10-CM | POA: Diagnosis not present

## 2018-10-14 DIAGNOSIS — Z0000 Encounter for general adult medical examination without abnormal findings: Secondary | ICD-10-CM

## 2018-10-14 DIAGNOSIS — Z823 Family history of stroke: Secondary | ICD-10-CM

## 2018-10-14 DIAGNOSIS — D491 Neoplasm of unspecified behavior of respiratory system: Secondary | ICD-10-CM | POA: Diagnosis not present

## 2018-10-14 DIAGNOSIS — R079 Chest pain, unspecified: Secondary | ICD-10-CM | POA: Diagnosis not present

## 2018-10-14 DIAGNOSIS — M4850XA Collapsed vertebra, not elsewhere classified, site unspecified, initial encounter for fracture: Secondary | ICD-10-CM | POA: Diagnosis not present

## 2018-10-14 LAB — COMPREHENSIVE METABOLIC PANEL
ALBUMIN: 3.5 g/dL (ref 3.5–5.0)
ALK PHOS: 62 U/L (ref 38–126)
ALT: 12 U/L (ref 0–44)
AST: 19 U/L (ref 15–41)
Anion gap: 12 (ref 5–15)
BUN: 15 mg/dL (ref 8–23)
CO2: 27 mmol/L (ref 22–32)
Calcium: 10.1 mg/dL (ref 8.9–10.3)
Chloride: 102 mmol/L (ref 98–111)
Creatinine, Ser: 1.03 mg/dL (ref 0.61–1.24)
GFR calc Af Amer: >60 mL/min (ref >60)
GFR calc non Af Amer: >60 mL/min (ref >60)
GLUCOSE: 86 mg/dL (ref 70–99)
Potassium: 4.3 mmol/L (ref 3.5–5.1)
Sodium: 141 mmol/L (ref 135–145)
Total Bilirubin: 1.1 mg/dL (ref 0.3–1.2)
Total Protein: 6.6 g/dL (ref 6.5–8.1)

## 2018-10-14 LAB — CBC
HCT: 42.1 % (ref 39.0–52.0)
HEMOGLOBIN: 13.5 g/dL (ref 13.0–17.0)
MCH: 31 pg (ref 26.0–34.0)
MCHC: 32.1 g/dL (ref 30.0–36.0)
MCV: 96.6 fL (ref 80.0–100.0)
Platelets: 190 10*3/uL (ref 150–400)
RBC: 4.36 MIL/uL (ref 4.22–5.81)
RDW: 13.4 % (ref 11.5–15.5)
WBC: 11.2 10*3/uL — ABNORMAL HIGH (ref 4.0–10.5)
nRBC: 0 % (ref 0.0–0.2)

## 2018-10-14 LAB — PROTIME-INR
INR: 1
Prothrombin Time: 13.1 seconds (ref 11.4–15.2)

## 2018-10-14 LAB — D-DIMER, QUANTITATIVE: D-Dimer, Quant: 1.07 ug/mL-FEU — ABNORMAL HIGH (ref 0.00–0.50)

## 2018-10-14 LAB — I-STAT TROPONIN, ED: Troponin i, poc: 0 ng/mL (ref 0.00–0.08)

## 2018-10-14 MED ORDER — TRAMADOL HCL 50 MG PO TABS
50.0000 mg | ORAL_TABLET | Freq: Once | ORAL | Status: AC
  Administered 2018-10-14: 50 mg via ORAL
  Filled 2018-10-14: qty 1

## 2018-10-14 MED ORDER — SODIUM CHLORIDE 0.9% FLUSH
3.0000 mL | Freq: Two times a day (BID) | INTRAVENOUS | Status: DC
  Administered 2018-10-14 – 2018-10-15 (×2): 3 mL via INTRAVENOUS
  Administered 2018-10-15: 10 mL via INTRAVENOUS
  Administered 2018-10-16 – 2018-10-20 (×8): 3 mL via INTRAVENOUS

## 2018-10-14 MED ORDER — IPRATROPIUM BROMIDE 0.02 % IN SOLN
0.5000 mg | Freq: Once | RESPIRATORY_TRACT | Status: AC
  Administered 2018-10-14: 0.5 mg via RESPIRATORY_TRACT
  Filled 2018-10-14: qty 2.5

## 2018-10-14 MED ORDER — SENNOSIDES-DOCUSATE SODIUM 8.6-50 MG PO TABS
1.0000 | ORAL_TABLET | Freq: Two times a day (BID) | ORAL | Status: DC
  Administered 2018-10-14 – 2018-10-15 (×2): 1 via ORAL
  Filled 2018-10-14 (×2): qty 1

## 2018-10-14 MED ORDER — IOPAMIDOL (ISOVUE-370) INJECTION 76%
INTRAVENOUS | Status: AC
  Administered 2018-10-14: 13:00:00
  Filled 2018-10-14: qty 100

## 2018-10-14 MED ORDER — METHOCARBAMOL 500 MG PO TABS
500.0000 mg | ORAL_TABLET | Freq: Four times a day (QID) | ORAL | Status: DC
  Administered 2018-10-14 – 2018-10-22 (×31): 500 mg via ORAL
  Filled 2018-10-14 (×30): qty 1

## 2018-10-14 MED ORDER — MORPHINE SULFATE 15 MG PO TABS
15.0000 mg | ORAL_TABLET | ORAL | Status: DC | PRN
  Administered 2018-10-15 – 2018-10-22 (×12): 15 mg via ORAL
  Filled 2018-10-14 (×12): qty 1

## 2018-10-14 MED ORDER — PANTOPRAZOLE SODIUM 40 MG PO TBEC
40.0000 mg | DELAYED_RELEASE_TABLET | Freq: Every day | ORAL | Status: DC
  Administered 2018-10-15 – 2018-10-22 (×8): 40 mg via ORAL
  Filled 2018-10-14 (×9): qty 1

## 2018-10-14 MED ORDER — METHOCARBAMOL 500 MG PO TABS
750.0000 mg | ORAL_TABLET | Freq: Once | ORAL | Status: AC
  Administered 2018-10-14: 750 mg via ORAL
  Filled 2018-10-14: qty 2

## 2018-10-14 MED ORDER — KCL IN DEXTROSE-NACL 20-5-0.9 MEQ/L-%-% IV SOLN
INTRAVENOUS | Status: DC
  Administered 2018-10-14 – 2018-10-17 (×6): via INTRAVENOUS
  Filled 2018-10-14 (×8): qty 1000

## 2018-10-14 MED ORDER — METOPROLOL TARTRATE 25 MG PO TABS
25.0000 mg | ORAL_TABLET | Freq: Two times a day (BID) | ORAL | Status: DC
  Administered 2018-10-14 – 2018-10-22 (×16): 25 mg via ORAL
  Filled 2018-10-14 (×16): qty 1

## 2018-10-14 MED ORDER — SODIUM CHLORIDE 0.9 % IV SOLN: 250.0000 mL | INTRAVENOUS | Status: DC | PRN

## 2018-10-14 MED ORDER — ADULT MULTIVITAMIN W/MINERALS CH
1.0000 | ORAL_TABLET | Freq: Every day | ORAL | Status: DC
  Administered 2018-10-15 – 2018-10-22 (×8): 1 via ORAL
  Filled 2018-10-14 (×8): qty 1

## 2018-10-14 MED ORDER — ACETAMINOPHEN 325 MG PO TABS
650.0000 mg | ORAL_TABLET | Freq: Once | ORAL | Status: AC
  Administered 2018-10-14: 650 mg via ORAL
  Filled 2018-10-14: qty 2

## 2018-10-14 MED ORDER — ONDANSETRON HCL 4 MG/2ML IJ SOLN: 4.0000 mg | Freq: Four times a day (QID) | INTRAMUSCULAR | Status: DC | PRN

## 2018-10-14 MED ORDER — SODIUM CHLORIDE 0.9% FLUSH: 3.0000 mL | INTRAVENOUS | Status: DC | PRN

## 2018-10-14 MED ORDER — MORPHINE SULFATE ER 15 MG PO TBCR
15.0000 mg | EXTENDED_RELEASE_TABLET | Freq: Two times a day (BID) | ORAL | Status: DC
  Administered 2018-10-14 – 2018-10-17 (×6): 15 mg via ORAL
  Filled 2018-10-14 (×6): qty 1

## 2018-10-14 MED ORDER — SODIUM CHLORIDE 0.9 % IV BOLUS
500.0000 mL | Freq: Once | INTRAVENOUS | Status: AC
  Administered 2018-10-14: 500 mL via INTRAVENOUS

## 2018-10-14 MED ORDER — GLYCERIN-HYPROMELLOSE-PEG 400 0.2-0.2-1 % OP SOLN
2.0000 [drp] | Freq: Every day | OPHTHALMIC | Status: DC
  Filled 2018-10-14: qty 15

## 2018-10-14 MED ORDER — POLYVINYL ALCOHOL 1.4 % OP SOLN
2.0000 [drp] | Freq: Every day | OPHTHALMIC | Status: DC
  Administered 2018-10-14 – 2018-10-21 (×6): 2 [drp] via OPHTHALMIC
  Filled 2018-10-14: qty 15

## 2018-10-14 MED ORDER — ACETAMINOPHEN 325 MG PO TABS
650.0000 mg | ORAL_TABLET | Freq: Four times a day (QID) | ORAL | Status: DC
  Administered 2018-10-14 – 2018-10-22 (×28): 650 mg via ORAL
  Filled 2018-10-14 (×29): qty 2

## 2018-10-14 MED ORDER — HYDROMORPHONE HCL 1 MG/ML IJ SOLN
0.5000 mg | Freq: Once | INTRAMUSCULAR | Status: AC
  Administered 2018-10-14: 0.5 mg via INTRAVENOUS
  Filled 2018-10-14: qty 1

## 2018-10-14 MED ORDER — ATORVASTATIN CALCIUM 40 MG PO TABS
80.0000 mg | ORAL_TABLET | Freq: Every day | ORAL | Status: DC
  Administered 2018-10-14 – 2018-10-22 (×9): 80 mg via ORAL
  Filled 2018-10-14 (×2): qty 1
  Filled 2018-10-14: qty 2
  Filled 2018-10-14 (×2): qty 1
  Filled 2018-10-14: qty 2
  Filled 2018-10-14 (×2): qty 1
  Filled 2018-10-14: qty 2

## 2018-10-14 MED ORDER — VITAMIN D 25 MCG (1000 UNIT) PO TABS
2000.0000 [IU] | ORAL_TABLET | Freq: Two times a day (BID) | ORAL | Status: DC
  Administered 2018-10-14 – 2018-10-22 (×16): 2000 [IU] via ORAL
  Filled 2018-10-14 (×15): qty 2

## 2018-10-14 MED ORDER — LISINOPRIL 5 MG PO TABS
5.0000 mg | ORAL_TABLET | Freq: Every day | ORAL | Status: DC
  Administered 2018-10-15 – 2018-10-19 (×5): 5 mg via ORAL
  Filled 2018-10-14 (×5): qty 1

## 2018-10-14 MED ORDER — ALBUTEROL SULFATE (2.5 MG/3ML) 0.083% IN NEBU
5.0000 mg | INHALATION_SOLUTION | Freq: Once | RESPIRATORY_TRACT | Status: AC
  Administered 2018-10-14: 5 mg via RESPIRATORY_TRACT
  Filled 2018-10-14: qty 6

## 2018-10-14 MED ORDER — MORPHINE SULFATE 15 MG PO TABS
30.0000 mg | ORAL_TABLET | ORAL | Status: DC | PRN
  Administered 2018-10-15 – 2018-10-22 (×6): 30 mg via ORAL
  Filled 2018-10-14 (×6): qty 2

## 2018-10-14 MED ORDER — HEPARIN SODIUM (PORCINE) 5000 UNIT/ML IJ SOLN
5000.0000 [IU] | Freq: Three times a day (TID) | INTRAMUSCULAR | Status: AC
  Administered 2018-10-14 – 2018-10-18 (×13): 5000 [IU] via SUBCUTANEOUS
  Filled 2018-10-14 (×13): qty 1

## 2018-10-14 NOTE — ED Triage Notes (Signed)
Pt with hx of MI presents today with CP and SOB that started 3 weeks ago that progressively worsened.  Pt presents with deep rattling cough that "hurts to cough."  Pt reports being seen through the [place] and diagnosed with a "pulled muscle" in his left shoulder blade.
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
## 2018-10-14 NOTE — Progress Notes (Signed)
Patient had 3 beats of V-tach, VSS. Text paged Arby Barrette, NP . No new orders received. Will continue to monitor.

## 2018-10-14 NOTE — ED Notes (Signed)
Patient called out twice, stating his pain has increased - states it is the same pain he has been dealing with (L upper back) but it's worse. MD aware, no new orders at this time.
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
## 2018-10-15 ENCOUNTER — Inpatient Hospital Stay (HOSPITAL_COMMUNITY): Payer: Medicare Other

## 2018-10-15 DIAGNOSIS — M546 Pain in thoracic spine: Secondary | ICD-10-CM

## 2018-10-15 DIAGNOSIS — Z0000 Encounter for general adult medical examination without abnormal findings: Secondary | ICD-10-CM

## 2018-10-15 DIAGNOSIS — E43 Unspecified severe protein-calorie malnutrition: Secondary | ICD-10-CM

## 2018-10-15 DIAGNOSIS — Z7189 Other specified counseling: Secondary | ICD-10-CM

## 2018-10-15 DIAGNOSIS — E440 Moderate protein-calorie malnutrition: Secondary | ICD-10-CM

## 2018-10-15 DIAGNOSIS — Z515 Encounter for palliative care: Secondary | ICD-10-CM

## 2018-10-15 LAB — BASIC METABOLIC PANEL
Anion gap: 10 (ref 5–15)
BUN: 16 mg/dL (ref 8–23)
CO2: 27 mmol/L (ref 22–32)
Calcium: 9.6 mg/dL (ref 8.9–10.3)
Chloride: 102 mmol/L (ref 98–111)
Creatinine, Ser: 1.04 mg/dL (ref 0.61–1.24)
GFR calc Af Amer: >60 mL/min (ref >60)
GFR calc non Af Amer: >60 mL/min (ref >60)
Glucose, Bld: 111 mg/dL — ABNORMAL HIGH (ref 70–99)
Potassium: 4.5 mmol/L (ref 3.5–5.1)
Sodium: 139 mmol/L (ref 135–145)

## 2018-10-15 LAB — CBC
HCT: 35.7 % — ABNORMAL LOW (ref 39.0–52.0)
Hemoglobin: 11.8 g/dL — ABNORMAL LOW (ref 13.0–17.0)
MCH: 31.4 pg (ref 26.0–34.0)
MCHC: 33.1 g/dL (ref 30.0–36.0)
MCV: 94.9 fL (ref 80.0–100.0)
Platelets: 153 10*3/uL (ref 150–400)
RBC: 3.76 MIL/uL — ABNORMAL LOW (ref 4.22–5.81)
RDW: 13.5 % (ref 11.5–15.5)
WBC: 9.7 10*3/uL (ref 4.0–10.5)
nRBC: 0 % (ref 0.0–0.2)

## 2018-10-15 LAB — GLUCOSE, CAPILLARY: Glucose-Capillary: 93 mg/dL (ref 70–99)

## 2018-10-15 MED ORDER — MORPHINE SULFATE 15 MG PO TABS
30.0000 mg | ORAL_TABLET | ORAL | Status: AC
  Administered 2018-10-15: 30 mg via ORAL
  Filled 2018-10-15: qty 2

## 2018-10-15 MED ORDER — SENNOSIDES-DOCUSATE SODIUM 8.6-50 MG PO TABS
2.0000 | ORAL_TABLET | Freq: Two times a day (BID) | ORAL | Status: DC
  Administered 2018-10-15 – 2018-10-22 (×14): 2 via ORAL
  Filled 2018-10-15 (×14): qty 2

## 2018-10-15 MED ORDER — GADOBUTROL 1 MMOL/ML IV SOLN
7.5000 mL | Freq: Once | INTRAVENOUS | Status: AC | PRN
  Administered 2018-10-15: 7.5 mL via INTRAVENOUS

## 2018-10-15 MED ORDER — GABAPENTIN 300 MG PO CAPS
300.0000 mg | ORAL_CAPSULE | Freq: Three times a day (TID) | ORAL | Status: DC
  Administered 2018-10-15 – 2018-10-22 (×22): 300 mg via ORAL
  Filled 2018-10-15 (×22): qty 1

## 2018-10-15 NOTE — Progress Notes (Addendum)
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 Pt reports "nothing tastes good" and that he is not "food driven". He hasn't really been eating much today; just came back from MRI.  Pt does no typically drink any oral nutrition supplements at home. He declined trying any options here during hospitalization. Labs & medications reviewed. CBG 93.  No recent significant weight loss reported.  NUTRITION - FOCUSED PHYSICAL EXAM:    Most Recent Value  Orbital Region  Mild depletion  Upper Arm Region  Moderate depletion  Thoracic and Lumbar Region  Moderate depletion  Buccal Region  Mild depletion  Temple Region  Mild depletion  Clavicle Bone Region  Moderate depletion  Clavicle and Acromion Bone Region  Moderate depletion  Scapular Bone Region  Moderate depletion  Dorsal Hand  Mild depletion  Patellar Region  Moderate depletion  Anterior Thigh Region   Moderate depletion  Posterior Calf Region  Moderate depletion  Edema (RD Assessment)  None     Diet Order:   Diet Order            Diet regular Room service appropriate? Yes; Fluid consistency: Thin  Diet effective now             EDUCATION NEEDS:   No education needs have been identified at this time  Skin:  Skin Assessment: Reviewed RN Assessment  Last BM:  N/A  Height:   Ht Readings from Last 1 Encounters:  10/14/18 5\' 11"  (1.803 m)   Weight:   Wt Readings from Last 1 Encounters:  10/14/18 65.8 kg   Wt Readings from Last 10 Encounters:  10/14/18 65.8 kg  06/14/18 68.9 kg  08/04/17 68.9 kg  08/06/16 68 kg  07/24/15 70.8 kg  07/19/14 70.3 kg  07/13/13 71.2 kg  09/15/11 69.4 kg   BMI:  Body mass index is 20.22 kg/m.  Estimated Nutritional Needs:   Kcal:  1800-2000  Protein:  90-105 gm  Fluid:  1.8-2.0 L  Arthur Holms, RD, LDN Pager #: [phone] After-Hours Pager #: [phone]

## 2018-10-15 NOTE — Progress Notes (Signed)
PROGRESS NOTE  Kyle Foster IEP:[id number] DOB: June 19, 1946 DOA: 10/14/2018 PCP: [facility]  Brief History    The patient. 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 He presented to [facility] with complaints of shortness of breath. He has been having substernal chest pain with radiation to the neck and back. The discomfort is persistent, but worse with cough and deep respiration. There are no URI symptoms or swelling of lower extremities.  The patient underwent a CTA chest in the ED that demonstrated no PE, but did show left lower lobe pulmonary nodules consistent with metastatic lesions, a 5.6 cm left upper lobe lesion with invasion of the chest wall, thoracic spine, and possible epidural extension of the tumor. There was also a 2.7 cm left hilar mass occluding the left lower lobe pulmonary artery branch and probably attenuating of occluding the inferior left pulmonary vein.   The patient has been admitted to a medical bed. He is receiving pain control. Plan is for the patient to go to IR for biopsy of the mass, but he has to be off of plavix for 5 days first. A & P  Lung mass with mediastinal adenopathy and invasion of chest wall and thoracic spinePlan is for IR to biopsy the mass (lung), but the patient must be off of Plavix for 5 days. Likely lung cancer.  Pt also has prior squamous cell as well as bladder CA, and parotid gland cancer. Metastasis from one of these sources is unlikely.  Cancer associated pain: With large mass that appears to invade the thoracic spine.  MRI with and without contrast has been ordered after discussion with Dr. Venia Minks from radiation oncology.  We will start the patient on MS Contin 15 mg p.o. every 12 hours with as needed breakthrough MSIR.  We will also schedule Tylenol for pain control as well I  have consulted palliative care for assistance with pain management and goals of care.  Patient had previously been started on gabapentin which I have discontinued pending evaluation by palliative medicine.ACS has been ruled out.  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  Noted I doubt that this is a metastasis from either of these 2 cancers.  Await biopsy results.  Tobacco abuse: Discussed with patient he plans to quit.  Primary hypertension: Continue metoprolol and lisinopril. Fair control of blood pressures.  Hyperlipidemia continue with atorvastatin.  Coronary artery disease hold Plavix for now. MI/ACS ruled out.  I have seen and examined this patient myself. I have spent 38 minute on his evaluation and care.  DVT prophylaxis: Heparin Code Status: Full Code Family Communication: Kyle Foster (Spouse) [phone] Disposition Plan: tbd   Karie Kirks, DO [facility] Direct contact: see [email or web address]  7PM-7AM contact night coverage as above 10/15/2018, 2:14 PM  LOS: 1 day   Consultants  . Palliative care and interventional radiology.  Procedures  . None .  Interval History/Subjective  The patient states that he is currently having pain, but that he has just been medicated. No new complaints. No acute distress.  Objective   Vitals:  Vitals:   10/14/18 2322 10/15/18 0815  BP: 111/71 129/63  Pulse: (!) 109 (!) 101  Resp: 17   Temp: 98.1 F (36.7 C) 98.1 F (36.7 C)  SpO2: 93% 92%  Exam:  Constitutional:  . The patient is awake, alert, and oriented x 3. No acute distress. Respiratory:  . No wheezes, rales, or rhonchi are auscultated.  . No increased work of breathing. . No tactile fremitus. Cardiovascular:  . RRR, no murmurs, ectopy, or gallups. . No LE extremity edema   . Normal pedal pulses Abdomen:  . Abdomen appears normal; no tenderness or masses . No hernias . No  HSM Musculoskeletal:  . Digits/nails BUE: no clubbing, cyanosis, petechiae, infection . exam of joints, bones, muscles of at least one of following: head/neck, RUE, LUE, RLE, LLE   o strength and tone normal, no atrophy, no abnormal movements o No tenderness, masses o Normal ROM, no contractures  Skin:  . No rashes, lesions, ulcers . palpation of skin: no induration or nodules Neurologic:  . CN 2-12 intact . Sensation all 4 extremities intact Psychiatric:  . Mental status o Mood, affect appropriate o Orientation to person, place, time  . judgment and insight appear intact     I have personally reviewed the following:   Today's Data  . CBC, Chemistry, CTA chest   Scheduled Meds: . acetaminophen  650 mg Oral Q6H  . atorvastatin  80 mg Oral q1800  . cholecalciferol  2,000 Units Oral BID  . gabapentin  300 mg Oral TID  . heparin  5,000 Units Subcutaneous Q8H  . lisinopril  5 mg Oral Daily  . methocarbamol  500 mg Oral QID  . metoprolol tartrate  25 mg Oral BID  . morphine  15 mg Oral Q12H  . morphine  30 mg Oral NOW  . multivitamin with minerals  1 tablet Oral Daily  . pantoprazole  40 mg Oral Daily  . polyvinyl alcohol  2 drop Both Eyes QHS  . senna-docusate  2 tablet Oral BID  . sodium chloride flush  3 mL Intravenous Q12H   Continuous Infusions: . sodium chloride    . dextrose 5 % and 0.9 % NaCl with KCl 20 mEq/L 100 mL/hr at 10/15/18 1132    Principal Problem:   Lung mass Active Problems:   Transitional cell carcinoma (HCC)   Essential hypertension   Hyperlipidemia   Tobacco abuse   Atypical chest pain   Cancer associated pain   Malnutrition of moderate degree   LOS: 1 day
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
## 2018-10-15 NOTE — Progress Notes (Signed)
Oncology was consulted from ED  I will plan to see the patient on Monday Awaiting IR biopsy of lung mass Radiation oncology following for consideration of palliative RT for the lung mass with risk for cord compression and pulmonary vasculature enroachment.  Sullivan Lone MD MS

## 2018-10-16 DIAGNOSIS — M546 Pain in thoracic spine: Secondary | ICD-10-CM

## 2018-10-16 DIAGNOSIS — Z515 Encounter for palliative care: Secondary | ICD-10-CM

## 2018-10-16 DIAGNOSIS — G893 Neoplasm related pain (acute) (chronic): Secondary | ICD-10-CM

## 2018-10-16 LAB — GLUCOSE, CAPILLARY: Glucose-Capillary: 89 mg/dL (ref 70–99)

## 2018-10-16 MED ORDER — CALCIUM CARBONATE ANTACID 500 MG PO CHEW
1.0000 | CHEWABLE_TABLET | Freq: Three times a day (TID) | ORAL | Status: DC
  Administered 2018-10-16 – 2018-10-22 (×19): 200 mg via ORAL
  Filled 2018-10-16 (×19): qty 1

## 2018-10-16 NOTE — Progress Notes (Addendum)
PROGRESS NOTE  Kyle Foster YCX:[id number] DOB: 06-04-1946 DOA: 10/14/2018 PCP: [facility]  Brief History    The patient. 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 He presented to [facility] with complaints of shortness of breath. He has been having substernal chest pain with radiation to the neck and back. The discomfort is persistent, but worse with cough and deep respiration. There are no URI symptoms or swelling of lower extremities.  The patient underwent a CTA chest in the ED that demonstrated no PE, but did show left lower lobe pulmonary nodules consistent with metastatic lesions, a 5.6 cm left upper lobe lesion with invasion of the chest wall, thoracic spine, and possible epidural extension of the tumor. There was also a 2.7 cm left hilar mass occluding the left lower lobe pulmonary artery branch and probably attenuating of occluding the inferior left pulmonary vein.   MRI performed on 10/15/2018 demonstrated some mild cord compression.   The patient has been admitted to a medical bed. He is receiving pain control. Palliative Care's assistance with pain control is greatly appreciated. Plan is for the patient to go to IR for biopsy of the mass, but he has to be off of plavix for 5 days first. A & P  Lung mass with mediastinal adenopathy and invasion of chest wall and thoracic spinePlan is for IR to biopsy the mass (lung), but the patient must be off of Plavix for 5 days. Likely lung cancer.  Pt also has prior squamous cell as well as bladder CA, and parotid gland cancer. Metastasis from one of these sources is unlikely. Oncology will see the patient on Monday.  Cancer associated pain: With large mass that appears to invade the thoracic spine.  MRI with and without contrast has been ordered after discussion with Dr. Venia Minks  from radiation oncology.  We will start the patient on MS Contin 15 mg p.o. every 12 hours with as needed breakthrough MSIR.  We will also schedule Tylenol for pain control as well I have consulted palliative care for assistance with pain management and goals of care.  Patient had previously been started on gabapentin which I have discontinued pending evaluation by palliative medicine.ACS has been ruled out. Pain control is as per palliative care.  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  Noted I doubt that this is a metastasis from either of these 2 cancers.  Await biopsy results.  Tobacco abuse: Discussed with patient he plans to quit.  Primary hypertension: Continue metoprolol and lisinopril. Fair control of blood pressures.  Hyperlipidemia continue with atorvastatin.  Coronary artery disease hold Plavix for now. MI/ACS ruled out.  I have seen and examined this patient myself. I have spent 34 minute on his evaluation and care.  DVT prophylaxis: Heparin Code Status: Full Code Family Communication: Norvel Wenker (Spouse) [phone] Disposition Plan: tbd   Karie Kirks, DO [facility] Direct contact: see [email or web address]  7PM-7AM contact night coverage as above 10/16/2018, 1:23 PM  LOS: 2 days   Consultants  . Palliative care and interventional radiology.  Procedures  . None .  Interval History/Subjective  The patient states that he is currently comfortable. No new complaints.  Objective   Vitals:  Vitals:   10/15/18 2315 10/16/18 0731  BP: 92/60 93/60  Pulse:  97 90  Resp: 16 15  Temp: 98 F (36.7 C) 98.2 F (36.8 C)  SpO2: 93% 92%    Exam:  Constitutional:  . The patient is awake, alert, and oriented x 3. No acute distress. Respiratory:  . No wheezes, rales, or rhonchi are auscultated.  . No increased work of breathing. . No tactile fremitus. Cardiovascular:  . RRR, no murmurs, ectopy,  or gallups. . No LE extremity edema   . Normal pedal pulses Abdomen:  . Abdomen appears normal; no tenderness or masses . No hernias . No HSM Musculoskeletal:  . Digits/nails BUE: no clubbing, cyanosis, petechiae, infection . exam of joints, bones, muscles of at least one of following: head/neck, RUE, LUE, RLE, LLE   o strength and tone normal, no atrophy, no abnormal movements o No tenderness, masses o Normal ROM, no contractures  Skin:  . No rashes, lesions, ulcers . palpation of skin: no induration or nodules Neurologic:  . CN 2-12 intact . Sensation all 4 extremities intact Psychiatric:  . Mental status o Mood, affect appropriate o Orientation to person, place, time  . judgment and insight appear intact     I have personally reviewed the following:   Today's Data  . CBC, Chemistry, CTA chest   Scheduled Meds: . acetaminophen  650 mg Oral Q6H  . atorvastatin  80 mg Oral q1800  . calcium carbonate  1 tablet Oral TID WC  . cholecalciferol  2,000 Units Oral BID  . gabapentin  300 mg Oral TID  . heparin  5,000 Units Subcutaneous Q8H  . lisinopril  5 mg Oral Daily  . methocarbamol  500 mg Oral QID  . metoprolol tartrate  25 mg Oral BID  . morphine  15 mg Oral Q12H  . multivitamin with minerals  1 tablet Oral Daily  . pantoprazole  40 mg Oral Daily  . polyvinyl alcohol  2 drop Both Eyes QHS  . senna-docusate  2 tablet Oral BID  . sodium chloride flush  3 mL Intravenous Q12H   Continuous Infusions: . sodium chloride    . dextrose 5 % and 0.9 % NaCl with KCl 20 mEq/L 100 mL/hr at 10/16/18 0800    Principal Problem:   Lung mass Active Problems:   Transitional cell carcinoma ([facility])   Essential hypertension   Hyperlipidemia   Tobacco abuse   Atypical chest pain   Cancer associated pain   Malnutrition of moderate degree   Acute left-sided thoracic back pain   Palliative care by specialist   Goals of care, counseling/discussion   LOS: 2 days

## 2018-10-16 NOTE — Care Management Note (Addendum)
Case Management Note  Patient Details  Name: Kyle Foster MRN: [id number] Date of Birth: [birth year]  Subjective/Objective:    From home , patient has PCP Kyle Foster at [facility] .  NCM will need to fax dc summary at dc.  Will need to call [facility] on Monday [phone] to get fax number.   Lung Mass, awaiting bx need plavix washout.    2/10 Tomi Bamberger RN, BSN - scheduled for lung bx by IR on 2/11.             Action/Plan: NCM will follow for transition of care needs.  Expected Discharge Date:  10/20/18               Expected Discharge Plan:  Home/Self Care  In-House Referral:     Discharge planning Services  CM Consult  Post Acute Care Choice:    Choice offered to:     DME Arranged:    DME Agency:     HH Arranged:    HH Agency:     Status of Service:  In process, will continue to follow  If discussed at Long Length of Stay Meetings, dates discussed:    Additional Comments:  Zenon Mayo, RN 10/16/2018, 12:31 PM

## 2018-10-16 NOTE — Progress Notes (Signed)
   Patient seen, chart reviewed.  No family at the bedside.  Patient verbalizing that pain has continued to improve daily since admission.  He is sitting up on the side of the bed which he states he was not been able to do before.  He slept well last night.  He also is stating though that his GERD is worse.  Verified that he is still on home medicines of Protonix 40 mg daily  We did talk further about ongoing pain management including probable up titration of morphine extended release product as well as at some point probably addition of an anti-inflammatory agent to address bony pain.  I am reluctant to do that at this point because of upcoming biopsy.  Restarting gabapentin seems to have helped the stabbing episodes that he has described.  We also discussed that oncology is planning to see patient on Monday.  Plan: Continue morphine extended release 15 mg every 12 and 15 to 30 mg every 4 as needed for breakthrough pain; continue gabapentin 300 mg 3 times daily for neuropathic pain GERD: Needs improvement.  Continue with Protonix.  We will add Tums with each meal Native medicine to follow-up with patient on 10/17/2018  Palliative medicine to continue to follow patient and adjust pain medicine.  Thank you, Romona Curls, NP Time spent: 25 minutes With 50% of time was spent in counseling and coordination of care

## 2018-10-17 LAB — GLUCOSE, CAPILLARY: Glucose-Capillary: 88 mg/dL (ref 70–99)

## 2018-10-17 MED ORDER — MORPHINE SULFATE ER 15 MG PO TBCR
15.0000 mg | EXTENDED_RELEASE_TABLET | Freq: Three times a day (TID) | ORAL | Status: DC
  Administered 2018-10-17 – 2018-10-22 (×15): 15 mg via ORAL
  Filled 2018-10-17 (×15): qty 1

## 2018-10-17 NOTE — Progress Notes (Signed)
   Patient seen, chart reviewed.  Patient's spouse is at the bedside.  Patient continues to verbalize that his pain has improved since admission.  He is struggling with breakthrough medication dosing in that sometimes his pain comes on very suddenly or he struggles with trying not to use medication and then his pain gets severe.  Patient was up walking in the room.  No lower extremity weakness; no gait disturbance observed.  Subjectively he denies feeling weakness in his legs.  Plan Oncology to see patient tomorrow regarding ongoing treatment options. We will change morphine extended release 15 mg to every 8 hours.  Continue with PRN for breakthrough pain.  Continue with gabapentin 300 3 times daily for neuropathic component of pain.  Patient likely has bony pain but at this point biopsy is pending, reluctant to add steroids or NSAIDs I gave patient and patient's wife MOST forms to review as well as [employer] for [employer]  Staffed with Dr. Benny Lennert regarding oncology follow-up as well as radiation oncology follow-up  Palliative medicine to stay involved and help assist with symptom management as well as goals of care  Thank you, Romona Curls, NP Time spent: 25 minutes Rater than 50% of time spent in counseling and coordination of care

## 2018-10-17 NOTE — Progress Notes (Addendum)
PROGRESS NOTE  Kyle Foster BDZ:[id number] DOB: 1945/10/10 DOA: 10/14/2018 PCP: [facility]  Brief History    The patient. 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 He presented to [facility] with complaints of shortness of breath. He has been having substernal chest pain with radiation to the neck and back. The discomfort is persistent, but worse with cough and deep respiration. There are no URI symptoms or swelling of lower extremities.  The patient underwent a CTA chest in the ED that demonstrated no PE, but did show left lower lobe pulmonary nodules consistent with metastatic lesions, a 5.6 cm left upper lobe lesion with invasion of the chest wall, thoracic spine, and possible epidural extension of the tumor. There was also a 2.7 cm left hilar mass occluding the left lower lobe pulmonary artery branch and probably attenuating of occluding the inferior left pulmonary vein.   MRI performed on 10/15/2018 demonstrated some mild thoracic cord compression.   The patient has been admitted to a medical bed. He is receiving pain control. Palliative Care's assistance with pain control is greatly appreciated. Plan is for the patient to go to IR for biopsy of the mass, but he has to be off of plavix for 5 days first. Day one without plavix was 10/15/2018. A & P  Lung mass with mediastinal adenopathy and invasion of chest wall and thoracic spinePlan is for IR to biopsy the mass (lung), but the patient must be off of Plavix for 5 days. Likely lung cancer.  Pt also has prior squamous cell as well as bladder CA, and parotid gland cancer. Metastasis from one of these sources is unlikely. Oncology will see the patient on Monday.  Cancer associated pain: With large mass that appears to invade the thoracic spine.  MRI with and without contrast has  been ordered after discussion with Dr. Venia Minks from radiation oncology.  We will start the patient on MS Contin 15 mg p.o. every 12 hours with as needed breakthrough MSIR.  We will also schedule Tylenol for pain control as well I have consulted palliative care for assistance with pain management and goals of care.  Patient had previously been started on gabapentin which I have discontinued pending evaluation by palliative medicine.ACS has been ruled out. Pain control is as per palliative care.  Mild thoracic cord compression on MRI 10/15/2018.  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  Noted I doubt that this is a metastasis from either of these 2 cancers.  Await biopsy results.  Tobacco abuse: Discussed with patient he plans to quit.  Primary hypertension: Continue metoprolol and lisinopril. Fair control of blood pressures.  Hyperlipidemia continue with atorvastatin.  Coronary artery disease hold Plavix for now. MI/ACS ruled out.  I have seen and examined this patient myself. I have spent 34 minute on his evaluation and care.  DVT prophylaxis: Heparin Code Status: Full Code Family Communication: Marquavis Hannen (Spouse) [phone] Disposition Plan: tbd   Karie Kirks, DO [facility] Direct contact: see [email or web address]  7PM-7AM contact night coverage as above 10/17/2018, 10:51 AM  LOS: 3 days   Consultants  . Palliative care and interventional radiology.  Procedures  . None .  Interval History/Subjective  The patient states that he is currently comfortable. No new complaints.  Objective  Vitals:  Vitals:   10/16/18 2329 10/17/18 0757  BP: 116/64 106/60  Pulse: 88 88  Resp: 16 16  Temp: 98.4 F (36.9 C) 98.1 F (36.7 C)  SpO2: 93% 92%    Exam:  Constitutional:  . The patient is awake, alert, and oriented x 3. No acute distress. Respiratory:  . No wheezes, rales, or rhonchi are auscultated.   . No increased work of breathing. . No tactile fremitus. Cardiovascular:  . RRR, no murmurs, ectopy, or gallups. . No LE extremity edema   . Normal pedal pulses Abdomen:  . Abdomen appears normal; no tenderness or masses . No hernias . No HSM Musculoskeletal:  . Digits/nails BUE: no clubbing, cyanosis, petechiae, infection . exam of joints, bones, muscles of at least one of following: head/neck, RUE, LUE, RLE, LLE   o strength and tone normal, no atrophy, no abnormal movements o No tenderness, masses o Normal ROM, no contractures  Skin:  . No rashes, lesions, ulcers . palpation of skin: no induration or nodules Neurologic:  . CN 2-12 intact . Sensation all 4 extremities intact Psychiatric:  . Mental status o Mood, affect appropriate o Orientation to person, place, time  . judgment and insight appear intact     I have personally reviewed the following:   Today's Data  . CBC, Chemistry, CTA chest   Scheduled Meds: . acetaminophen  650 mg Oral Q6H  . atorvastatin  80 mg Oral q1800  . calcium carbonate  1 tablet Oral TID WC  . cholecalciferol  2,000 Units Oral BID  . gabapentin  300 mg Oral TID  . heparin  5,000 Units Subcutaneous Q8H  . lisinopril  5 mg Oral Daily  . methocarbamol  500 mg Oral QID  . metoprolol tartrate  25 mg Oral BID  . morphine  15 mg Oral Q12H  . multivitamin with minerals  1 tablet Oral Daily  . pantoprazole  40 mg Oral Daily  . polyvinyl alcohol  2 drop Both Eyes QHS  . senna-docusate  2 tablet Oral BID  . sodium chloride flush  3 mL Intravenous Q12H   Continuous Infusions: . sodium chloride    . dextrose 5 % and 0.9 % NaCl with KCl 20 mEq/L 100 mL/hr at 10/17/18 6283    Principal Problem:   Lung mass Active Problems:   Transitional cell carcinoma (HCC)   Essential hypertension   Hyperlipidemia   Tobacco abuse   Atypical chest pain   Cancer associated pain   Malnutrition of moderate degree   Acute left-sided thoracic back pain    Palliative care by specialist   Goals of care, counseling/discussion   LOS: 3 days

## 2018-10-18 ENCOUNTER — Ambulatory Visit
Admit: 2018-10-18 | Discharge: 2018-10-18 | Disposition: A | Discharge status: Home / Self Care | Payer: Medicare Other | Source: Ambulatory Visit | Attending: Radiation Oncology | Admitting: Radiation Oncology

## 2018-10-18 DIAGNOSIS — J984 Other disorders of lung: Secondary | ICD-10-CM

## 2018-10-18 DIAGNOSIS — C3412 Malignant neoplasm of upper lobe, left bronchus or lung: Secondary | ICD-10-CM

## 2018-10-18 DIAGNOSIS — F17210 Nicotine dependence, cigarettes, uncomplicated: Secondary | ICD-10-CM

## 2018-10-18 DIAGNOSIS — C7951 Secondary malignant neoplasm of bone: Secondary | ICD-10-CM

## 2018-10-18 LAB — GLUCOSE, CAPILLARY: Glucose-Capillary: 70 mg/dL (ref 70–99)

## 2018-10-18 NOTE — Progress Notes (Signed)
Patient ID: Kyle Foster,    DOB: October 01, 1945, 73 y.o.   MRN: [id number]  This NP visited patient at the bedside as a follow up palliative medicine needs and emotional support.  Patient's wife and daughter at bedside.  Patient reports acceptable pain control on his current medication regime.  We discussed pain management strategies depending on his need for breakthrough medications.  Will reevaluate in the morning.  Patient anticipates biopsy tomorrow.  He was seen today by radiation oncology and will follow up for consideration of stereotactic radiotherapy.  He will continue with medical oncology as OP.     Discussed with patient the importance of continued conversation with his family and the  medical providers regarding overall plan of care and treatment options,  ensuring decisions are within the context of the patients values and GOCs.  Questions and concerns addressed  PMT will continue to support holistically  Total time spent on the unit was 25 minutes  Greater than 50% of the time was spent in counseling and coordination of care  Wadie Lessen NP  Palliative Medicine Team Team Phone # [phone] Pager [phone]

## 2018-10-18 NOTE — Care Management Important Message (Signed)
Important Message  Patient Details  Name: [name] MRN: [id number] Date of Birth: Jun 10, 1946   Medicare Important Message Given:  Yes    Orbie Pyo 10/18/2018, 4:05 PM

## 2018-10-18 NOTE — Progress Notes (Signed)
PROGRESS NOTE  HAMPTON WIXOM TDD:[id number] DOB: 1945-10-04 DOA: 10/14/2018 PCP: [facility]  Brief History    The patient. 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 He presented to [facility] with complaints of shortness of breath. He has been having substernal chest pain with radiation to the neck and back. The discomfort is persistent, but worse with cough and deep respiration. There are no URI symptoms or swelling of lower extremities.  The patient underwent a CTA chest in the ED that demonstrated no PE, but did show left lower lobe pulmonary nodules consistent with metastatic lesions, a 5.6 cm left upper lobe lesion with invasion of the chest wall, thoracic spine, and possible epidural extension of the tumor. There was also a 2.7 cm left hilar mass occluding the left lower lobe pulmonary artery branch and probably attenuating of occluding the inferior left pulmonary vein.   MRI performed on 10/15/2018 demonstrated some mild thoracic cord compression. Radiation oncology has been consulted and will see the patient today.  The patient has been admitted to a medical bed. He is receiving pain control. Palliative Care's assistance with pain control is greatly appreciated. Plan is for the patient to go to IR for biopsy of the mass, but he has to be off of plavix for 5 days first. Day one without plavix was 10/15/2018. Plan is for biopsy tomorrow. A & P  Lung mass with mediastinal adenopathy and invasion of chest wall and thoracic spinePlan is for IR to biopsy the mass (lung), but the patient must be off of Plavix for 5 days. Likely lung cancer.  Pt also has prior squamous cell as well as bladder CA, and parotid gland cancer. Metastasis from one of these sources is unlikely. Oncology will see the patient on Monday. Biopsy will take place on  10/19/2018.  Cancer associated pain: With large mass that appears to invade the thoracic spine.  MRI with and without contrast has been ordered after discussion with Dr. Venia Minks from radiation oncology.  We will start the patient on MS Contin 15 mg p.o. every 12 hours with as needed breakthrough MSIR.  We will also schedule Tylenol for pain control as well I have consulted palliative care for assistance with pain management and goals of care.  Patient had previously been started on gabapentin which I have discontinued pending evaluation by palliative medicine.ACS has been ruled out. Pain control is as per palliative care.  Mild thoracic cord compression on MRI 10/15/2018. Radiation oncology has been consulted and will see the patient today.   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  Noted I doubt that this is a metastasis from either of these 2 cancers.  Await biopsy results.  Tobacco abuse: Discussed with patient he plans to quit.  Primary hypertension: Continue metoprolol and lisinopril. Fair control of blood pressures.  Hyperlipidemia continue with atorvastatin.  Coronary artery disease hold Plavix for now. MI/ACS ruled out.  I have seen and examined this patient myself. I have spent 30 minute on his evaluation and care.  DVT prophylaxis: Heparin Code Status: Full Code Family Communication: Kollen Armenti (Spouse) [phone] Disposition Plan: tbd   Karie Kirks, DO [facility] Direct contact: see [email or web address]  7PM-7AM contact night coverage as above 10/18/2018, 3:42 PM  LOS: 4 days  Consultants  . Palliative care and interventional radiology.  Procedures  . None .  Interval History/Subjective  The patient states that he is currently comfortable. No new complaints.  Objective   Vitals:  Vitals:   10/18/18 0020 10/18/18 0800  BP: 100/64 107/66  Pulse: 79 87  Resp: 18 18  Temp: 98.3 F (36.8 C)  98 F (36.7 C)  SpO2: 93% 93%    Exam:  Constitutional:  . The patient is awake, alert, and oriented x 3. No acute distress. Respiratory:  . No wheezes, rales, or rhonchi are auscultated.  . No increased work of breathing. . No tactile fremitus. Cardiovascular:  . RRR, no murmurs, ectopy, or gallups. . No LE extremity edema   . Normal pedal pulses Abdomen:  . Abdomen appears normal; no tenderness or masses . No hernias . No HSM Musculoskeletal:  . Digits/nails BUE: no clubbing, cyanosis, petechiae, infection . exam of joints, bones, muscles of at least one of following: head/neck, RUE, LUE, RLE, LLE   o strength and tone normal, no atrophy, no abnormal movements o No tenderness, masses o Normal ROM, no contractures  Skin:  . No rashes, lesions, ulcers . palpation of skin: no induration or nodules Neurologic:  . CN 2-12 intact . Sensation all 4 extremities intact Psychiatric:  . Mental status o Mood, affect appropriate o Orientation to person, place, time  . judgment and insight appear intact     I have personally reviewed the following:   Today's Data  . CBC, Chemistry, CTA chest   Scheduled Meds: . acetaminophen  650 mg Oral Q6H  . atorvastatin  80 mg Oral q1800  . calcium carbonate  1 tablet Oral TID WC  . cholecalciferol  2,000 Units Oral BID  . gabapentin  300 mg Oral TID  . heparin  5,000 Units Subcutaneous Q8H  . lisinopril  5 mg Oral Daily  . methocarbamol  500 mg Oral QID  . metoprolol tartrate  25 mg Oral BID  . morphine  15 mg Oral Q8H  . multivitamin with minerals  1 tablet Oral Daily  . pantoprazole  40 mg Oral Daily  . polyvinyl alcohol  2 drop Both Eyes QHS  . senna-docusate  2 tablet Oral BID  . sodium chloride flush  3 mL Intravenous Q12H   Continuous Infusions: . sodium chloride      Principal Problem:   Lung mass Active Problems:   Transitional cell carcinoma (HCC)   Essential hypertension   Hyperlipidemia   Tobacco abuse    Atypical chest pain   Cancer associated pain   Malnutrition of moderate degree   Acute left-sided thoracic back pain   Palliative care by specialist   Goals of care, counseling/discussion   LOS: 4 days
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
## 2018-10-18 NOTE — H&P (Signed)
  Pt is scheduled for left lung mass biopsy in IR 2/11  New orders in place

## 2018-10-19 ENCOUNTER — Inpatient Hospital Stay (HOSPITAL_COMMUNITY): Payer: Medicare Other

## 2018-10-19 DIAGNOSIS — C3412 Malignant neoplasm of upper lobe, left bronchus or lung: Secondary | ICD-10-CM

## 2018-10-19 DIAGNOSIS — C7951 Secondary malignant neoplasm of bone: Secondary | ICD-10-CM

## 2018-10-19 DIAGNOSIS — C799 Secondary malignant neoplasm of unspecified site: Secondary | ICD-10-CM

## 2018-10-19 LAB — GLUCOSE, CAPILLARY: Glucose-Capillary: 101 mg/dL — ABNORMAL HIGH (ref 70–99)

## 2018-10-19 MED ORDER — MIDAZOLAM HCL 2 MG/2ML IJ SOLN
INTRAMUSCULAR | Status: AC | PRN
  Administered 2018-10-19: 0.5 mg via INTRAVENOUS

## 2018-10-19 MED ORDER — FENTANYL CITRATE (PF) 100 MCG/2ML IJ SOLN
INTRAMUSCULAR | Status: AC
  Filled 2018-10-19: qty 2

## 2018-10-19 MED ORDER — CLOPIDOGREL BISULFATE 75 MG PO TABS
75.0000 mg | ORAL_TABLET | Freq: Every day | ORAL | Status: DC
  Administered 2018-10-19 – 2018-10-22 (×4): 75 mg via ORAL
  Filled 2018-10-19 (×5): qty 1

## 2018-10-19 MED ORDER — MIDAZOLAM HCL 2 MG/2ML IJ SOLN
INTRAMUSCULAR | Status: AC
  Filled 2018-10-19: qty 2

## 2018-10-19 MED ORDER — FENTANYL CITRATE (PF) 100 MCG/2ML IJ SOLN
INTRAMUSCULAR | Status: AC | PRN
  Administered 2018-10-19: 25 ug via INTRAVENOUS

## 2018-10-19 NOTE — Procedures (Signed)
Interventional Radiology Procedure:   Indications: Left lung / chest mass with bony invasion.    Procedure: CT guided core biopsy  Findings: Soft tissue mass in left posterior chest.  3 cores obtained.    Complications: None     EBL: Minimal  Plan: Bedrest and CXR in one hour.     Kyle Foster R. Anselm Pancoast, MD  Pager: [phone]

## 2018-10-19 NOTE — Progress Notes (Signed)
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 Electrolytes: Monitor and supplement Nutrition:  Heart healthy diet   Prophylaxis: Enoxaparin   Disposition: Pending biopsy and possible starting radiation treatments    Full code   Consultants:   Oncology, Dr. [name]  Interventional radiology  Palliative care  Procedures:   IR guided biopsy of lung mass on 10/19/2018: Pending  Antimicrobials:  None   Subjective: This morning the patient reports he is feeling well.  He denies any shortness of breath, chest pain, nausea, vomiting, diarrhea, abdominal pain.  He is pending a biopsy today.  Objective: Vitals:   10/18/18 1626 10/18/18 2104 10/19/18 0759 10/19/18 0759  BP: (!) 102/59 102/61 (!) 86/59 (!) 86/59  Pulse: 86 88 86 82  Resp: 20 20 17 17   Temp: 98.7 F (37.1 C) 98.5 F (36.9 C) 97.8 F (36.6 C) 97.8 F (36.6 C)  TempSrc:  Oral Oral Oral  SpO2: 93% 93% 94% 93%  Weight:      Height:       No intake or output data in the 24 hours ending 10/19/18 0929 Filed Weights   10/14/18 0951 10/17/18 0629  Weight: 65.8 kg 66.3 kg    Examination:  General exam: Appears calm and comfortable  Respiratory system: Clear to auscultation. Respiratory effort normal. Cardiovascular system: Regular rate and rhythm, no murmurs Gastrointestinal system: Abdomen is nondistended, soft and nontender. No organomegaly or masses felt. Normal bowel sounds heard. Central nervous system: Alert and oriented.  Grossly intact, moving all extremities, bilateral upper extremity and lower extremity strength is 5 out of 5 Extremities: No lower extremity edema r. Skin: Well-healed incision over left side of face and jaw Psychiatry: Judgement and insight appear normal. Mood & affect appropriate.     Data Reviewed: I have personally reviewed following labs and imaging  studies  CBC: Recent Labs  Lab 10/14/18 0951 10/15/18 0345  WBC 11.2* 9.7  HGB 13.5 11.8*  HCT 42.1 35.7*  MCV 96.6 94.9  PLT 190 009   Basic Metabolic Panel: Recent Labs  Lab 10/14/18 0951 10/15/18 0345  NA 141  139  K 4.3 4.5  CL 102 102  CO2 27 27  GLUCOSE 86 111*  BUN 15 16  CREATININE 1.03 1.04  CALCIUM 10.1 9.6   GFR: Estimated Creatinine Clearance: 61.1 mL/min (by C-G formula based on SCr of 1.04 mg/dL). Liver Function Tests: Recent Labs  Lab 10/14/18 0951  AST 19  ALT 12  ALKPHOS 62  BILITOT 1.1  PROT 6.6  ALBUMIN 3.5   No results for input(s): LIPASE, AMYLASE in the last 168 hours. No results for input(s): AMMONIA in the last 168 hours. Coagulation Profile: Recent Labs  Lab 10/14/18 1803  INR 1.00   Cardiac Enzymes: No results for input(s): CKTOTAL, CKMB, CKMBINDEX, TROPONINI in the last 168 hours. BNP (last 3 results) No results for input(s): PROBNP in the last 8760 hours. HbA1C: No results for input(s): HGBA1C in the last 72 hours. CBG: Recent Labs  Lab 10/15/18 0817 10/16/18 0724 10/17/18 0802 10/18/18 0757 10/19/18 0800  GLUCAP 93 89 88 70 101*   Lipid Profile: No results for input(s): CHOL, HDL, LDLCALC, TRIG, CHOLHDL, LDLDIRECT in the last 72 hours. Thyroid Function Tests: No results for input(s): TSH, T4TOTAL, FREET4, T3FREE, THYROIDAB in the last 72 hours. Anemia Panel: No results for input(s): VITAMINB12, FOLATE, FERRITIN, TIBC, IRON, RETICCTPCT in the last 72 hours. Sepsis Labs: No results for input(s): PROCALCITON, LATICACIDVEN in the last 168 hours.  No results found for this or any previous visit (from the past 240 hour(s)).       Radiology Studies: No results found.      Scheduled Meds: . acetaminophen  650 mg Oral Q6H  . atorvastatin  80 mg Oral q1800  . calcium carbonate  1 tablet Oral TID WC  . cholecalciferol  2,000 Units Oral BID  . gabapentin  300 mg Oral TID  . lisinopril  5 mg Oral Daily  . methocarbamol  500 mg Oral QID  . metoprolol tartrate  25 mg Oral BID  . morphine  15 mg Oral Q8H  . multivitamin with minerals  1 tablet Oral Daily  . pantoprazole  40 mg Oral Daily  . polyvinyl alcohol  2 drop Both Eyes QHS  .  senna-docusate  2 tablet Oral BID  . sodium chloride flush  3 mL Intravenous Q12H   Continuous Infusions: . sodium chloride       LOS: 5 days    Time spent: [place], MD [facility]  If 7PM-7AM, please contact night-coverage [email or web address] Password [facility] 10/19/2018, 9:29 AM

## 2018-10-19 NOTE — Progress Notes (Signed)
Patient ID: Kyle Foster,    DOB: 03/19/1946, 73 y.o.   MRN: [id number]  This NP visited patient at the bedside as a follow up palliative medicine needs and emotional support.  Patient's wife  at bedside.  Patient reports continued  acceptable pain control on his current medication regime. We discussed the importance of coordination of care with OP PCP and [place] Oncologist for ongoing pain management.  [facility]  Encouraged him to consider  PCP outside of [place] system.   Patient anticipates transfer to [facility] hospital this afternoon.Marland Kitchen   He will continue with medical oncology as OP.     Discussed with patient the importance of continued conversation with his family and the  medical providers regarding overall plan of care and treatment options,  ensuring decisions are within the context of the patients values and GOCs.  Suggested community based PCS on discharge.  Questions and concerns addressed  PMT will continue to support holistically  Total time spent on the unit was 25 minutes  Greater than 50% of the time was spent in counseling and coordination of care  Wadie Lessen NP  Palliative Medicine Team Team Phone # [phone] Pager [phone]

## 2018-10-20 ENCOUNTER — Ambulatory Visit
Admit: 2018-10-20 | Discharge: 2018-10-20 | Disposition: A | Discharge status: Home / Self Care | Payer: Medicare Other | Source: Ambulatory Visit | Attending: Radiation Oncology | Admitting: Radiation Oncology

## 2018-10-20 DIAGNOSIS — C3412 Malignant neoplasm of upper lobe, left bronchus or lung: Secondary | ICD-10-CM

## 2018-10-20 DIAGNOSIS — Z510 Encounter for antineoplastic radiation therapy: Secondary | ICD-10-CM | POA: Insufficient documentation

## 2018-10-20 LAB — BASIC METABOLIC PANEL
BUN: 27 mg/dL — ABNORMAL HIGH (ref 8–23)
CO2: 27 mmol/L (ref 22–32)
Chloride: 100 mmol/L (ref 98–111)
Creatinine, Ser: 1.25 mg/dL — ABNORMAL HIGH (ref 0.61–1.24)
GFR calc Af Amer: >60 mL/min (ref >60)
GFR calc non Af Amer: 58 mL/min — ABNORMAL LOW (ref >60)
Glucose, Bld: 99 mg/dL (ref 70–99)
Potassium: 5.9 mmol/L — ABNORMAL HIGH (ref 3.5–5.1)
Sodium: 136 mmol/L (ref 135–145)

## 2018-10-20 LAB — CBC
HCT: 38.4 % — ABNORMAL LOW (ref 39.0–52.0)
Hemoglobin: 12.3 g/dL — ABNORMAL LOW (ref 13.0–17.0)
MCH: 30.4 pg (ref 26.0–34.0)
MCHC: 32 g/dL (ref 30.0–36.0)
MCV: 94.8 fL (ref 80.0–100.0)
Platelets: 183 10*3/uL (ref 150–400)
RBC: 4.05 MIL/uL — ABNORMAL LOW (ref 4.22–5.81)
RDW: 13 % (ref 11.5–15.5)
WBC: 7.3 10*3/uL (ref 4.0–10.5)
nRBC: 0 % (ref 0.0–0.2)

## 2018-10-20 LAB — BASIC METABOLIC PANEL WITH GFR
Anion gap: 9 (ref 5–15)
Calcium: 10.7 mg/dL — ABNORMAL HIGH (ref 8.9–10.3)

## 2018-10-20 MED ORDER — SODIUM ZIRCONIUM CYCLOSILICATE 10 G PO PACK
10.0000 g | PACK | Freq: Once | ORAL | Status: AC
  Administered 2018-10-20: 10 g via ORAL
  Filled 2018-10-20 (×2): qty 1

## 2018-10-20 NOTE — Plan of Care (Signed)
  Problem: Activity: Goal: Risk for activity intolerance will decrease Outcome: Progressing   Problem: Nutrition: Goal: Adequate nutrition will be maintained Outcome: Progressing   Problem: Coping: Goal: Level of anxiety will decrease Outcome: Progressing   

## 2018-10-20 NOTE — Progress Notes (Signed)
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 He underwent lung biopsy 2/12.   New events last 24 hours / Subjective: Patient reports pain is well controlled on current regimen.  Plan for transfer to [facility] hospital once bed available.  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 -S/p CT guided biopsy left posterior chest 2/11. Pathology pending.  -Oncology following Dr. [name]  -Radiation oncology following, simulation today for radiotherapy. Transfer to [facility] once bed available   Coronary artery disease status post MI/hypertension/hyperlipidemia: -Resume clopidogrel -Continue atorvastatin  -Hold lisinopril due to hyperkalemia -Continue metoprolol   History of squamous cell cancer of the parotid/history of bladder cancer: Not likely that this  metastatic cancer is related to these as these appear to be in remission  Chronic pain: -Per above palliative care following for pain management   Hyperkalemia -Hold lisinopril -Lokelma ordered -Trend BMP    DVT prophylaxis: SCD Code Status: Full Family Communication: No family at bedside Disposition Plan: Transfer to [facility] for radiotherapy    Antimicrobials:  Anti-infectives (From admission, onward)   None        Objective: Vitals:   10/19/18 1300 10/19/18 1723 10/19/18 2104 10/20/18 0827  BP: 119/74 (!) 94/57 99/60 (!) 101/55  Pulse:  87 88 81  Resp:  17  20  Temp:  98.3 F (36.8 C) 98.2 F (36.8 C) 98.7 F (37.1 C)  TempSrc:  Oral Oral   SpO2:  (!) 89% 90% 94%  Weight:      Height:        Intake/Output Summary (Last 24 hours) at 10/20/2018 1307 Last data filed at 10/20/2018 0700 Gross per 24 hour  Intake 240 ml  Output -  Net 240 ml   Filed Weights   10/14/18 0951 10/17/18 0629  Weight: 65.8 kg 66.3 kg    Examination:  General exam: Appears calm and comfortable  Respiratory system: Clear to auscultation. Respiratory effort normal. Cardiovascular system: S1 & S2 heard, RRR. No JVD, murmurs, rubs, gallops or clicks. No pedal edema. Gastrointestinal system: Abdomen is nondistended, soft and nontender. No organomegaly or masses felt. Normal bowel sounds heard. Central nervous system: Alert and oriented Extremities: Symmetric 5 x 5 power. Skin: No rashes, lesions or ulcers Psychiatry: Judgement and insight appear normal. Mood & affect appropriate.   Data Reviewed: I have personally reviewed  following labs and imaging studies  CBC: Recent Labs  Lab 10/14/18 0951 10/15/18 0345 10/20/18 0212  WBC 11.2* 9.7 7.3  HGB 13.5 11.8* 12.3*  HCT 42.1 35.7* 38.4*  MCV 96.6 94.9 94.8  PLT 190 153 867   Basic Metabolic Panel: Recent Labs  Lab 10/14/18 0951 10/15/18 0345 10/20/18 0212  NA 141 139 136  K 4.3 4.5 5.9*  CL 102 102 100    CO2 27 27 27   GLUCOSE 86 111* 99  BUN 15 16 27*  CREATININE 1.03 1.04 1.25*  CALCIUM 10.1 9.6 10.7*   GFR: Estimated Creatinine Clearance: 50.8 mL/min (A) (by C-G formula based on SCr of 1.25 mg/dL (H)). Liver Function Tests: Recent Labs  Lab 10/14/18 0951  AST 19  ALT 12  ALKPHOS 62  BILITOT 1.1  PROT 6.6  ALBUMIN 3.5   No results for input(s): LIPASE, AMYLASE in the last 168 hours. No results for input(s): AMMONIA in the last 168 hours. Coagulation Profile: Recent Labs  Lab 10/14/18 1803  INR 1.00   Cardiac Enzymes: No results for input(s): CKTOTAL, CKMB, CKMBINDEX, TROPONINI in the last 168 hours. BNP (last 3 results) No results for input(s): PROBNP in the last 8760 hours. HbA1C: No results for input(s): HGBA1C in the last 72 hours. CBG: Recent Labs  Lab 10/15/18 0817 10/16/18 0724 10/17/18 0802 10/18/18 0757 2018/10/26 0800  GLUCAP 93 89 88 70 101*   Lipid Profile: No results for input(s): CHOL, HDL, LDLCALC, TRIG, CHOLHDL, LDLDIRECT in the last 72 hours. Thyroid Function Tests: No results for input(s): TSH, T4TOTAL, FREET4, T3FREE, THYROIDAB in the last 72 hours. Anemia Panel: No results for input(s): VITAMINB12, FOLATE, FERRITIN, TIBC, IRON, RETICCTPCT in the last 72 hours. Sepsis Labs: No results for input(s): PROCALCITON, LATICACIDVEN in the last 168 hours.  No results found for this or any previous visit (from the past 240 hour(s)).     Radiology Studies: Ct Biopsy  Result Date: 10/26/2018 INDICATION: 73 year old with a left lung mass with chest wall and bone invasion. Tissue diagnosis is needed. EXAM: CT-GUIDED CORE BIOPSY OF LEFT LUNG/CHEST MASS MEDICATIONS: None. ANESTHESIA/SEDATION: Moderate (conscious) sedation was employed during this procedure. A total of Versed 1.5 mg and Fentanyl 25 mcg was administered intravenously. Moderate Sedation Time: 17 minutes. The patient's level of consciousness and vital signs were monitored continuously by  radiology nursing throughout the procedure under my direct supervision. FLUOROSCOPY TIME:  None COMPLICATIONS: None immediate. PROCEDURE: Informed written consent was obtained from the patient after a thorough discussion of the procedural risks, benefits and alternatives. All questions were addressed. A timeout was performed prior to the initiation of the procedure. Patient was placed prone on the CT scanner. Images through the chest were obtained. The mass involving the left posterior chest wall was targeted. Left side of the back was prepped with chlorhexidine and sterile field was created. Maximal barrier sterile technique was utilized including caps, mask, sterile gowns, sterile gloves, sterile drape, hand hygiene and skin antiseptic. Skin and soft tissues were anesthetized with 1% lidocaine. 17 gauge coaxial needle was directed into the posterior chest wall mass with CT guidance. Needle position was confirmed within the lesion. Three core biopsies were obtained with an 18 gauge core device. Specimens placed in formalin. Needle was removed without complication. Bandage placed over the puncture site. FINDINGS: Again noted is a mass involving the posterior left lung with invasion into the posterior chest wall and adjacent bones. Needle position confirmed within the soft tissue mass. Adequate core  biopsies obtained. IMPRESSION: CT-guided core biopsies of the left posterior lung/chest wall mass. Electronically Signed   By: Markus Daft M.D.   On: 10/19/2018 13:18   Dg Chest Port 1 View  Result Date: 10/19/2018 CLINICAL DATA:  Biopsy of a LEFT UPPER LOBE lung mass earlier today. EXAM: PORTABLE CHEST 1 VIEW COMPARISON:  Chest x-rays 10/14/2018 and earlier. CT chest 10/14/2018 and earlier. FINDINGS: No evidence of pneumothorax after biopsy of the LEFT UPPER LOBE lung mass. The mass in the posteromedial LEFT UPPER LOBE presents as a focal opacity on the chest x-ray. Severe emphysematous changes in both lungs as noted  previously. No new pulmonary parenchymal abnormalities. IMPRESSION: No evidence of pneumothorax after biopsy of the LEFT UPPER LOBE lung mass. Electronically Signed   By: Evangeline Dakin M.D.   On: 10/19/2018 11:38      Scheduled Meds: . acetaminophen  650 mg Oral Q6H  . atorvastatin  80 mg Oral q1800  . calcium carbonate  1 tablet Oral TID WC  . cholecalciferol  2,000 Units Oral BID  . clopidogrel  75 mg Oral Daily  . gabapentin  300 mg Oral TID  . methocarbamol  500 mg Oral QID  . metoprolol tartrate  25 mg Oral BID  . morphine  15 mg Oral Q8H  . multivitamin with minerals  1 tablet Oral Daily  . pantoprazole  40 mg Oral Daily  . polyvinyl alcohol  2 drop Both Eyes QHS  . senna-docusate  2 tablet Oral BID  . sodium chloride flush  3 mL Intravenous Q12H   Continuous Infusions: . sodium chloride       LOS: 6 days    Time spent: 40 minutes   Dessa Phi, DO [facility] [email or web address] 10/20/2018, 1:07 PM

## 2018-10-20 NOTE — Progress Notes (Signed)
I spoke with the patient and wife by speakerphone. He was able to simulate today for palliative radiotherapy. I spoke with pathology as well and shared that the biopsy revealed a squamous cell carcinoma. It is most likely lung due to clinical scenario, but I explained that an outpatient PET scan would also be helpful at ruling out any other disease in his prior surgical site from parotid surgery.

## 2018-10-21 ENCOUNTER — Ambulatory Visit: Payer: Medicare Other

## 2018-10-21 LAB — BASIC METABOLIC PANEL
Anion gap: 6 (ref 5–15)
BUN: 26 mg/dL — AB (ref 8–23)
CO2: 29 mmol/L (ref 22–32)
Calcium: 10.1 mg/dL (ref 8.9–10.3)
Chloride: 101 mmol/L (ref 98–111)
Creatinine, Ser: 1.01 mg/dL (ref 0.61–1.24)
GFR calc Af Amer: >60 mL/min (ref >60)
GFR calc non Af Amer: >60 mL/min (ref >60)
Glucose, Bld: 83 mg/dL (ref 70–99)
Potassium: 5.1 mmol/L (ref 3.5–5.1)
SODIUM: 136 mmol/L (ref 135–145)

## 2018-10-21 NOTE — Progress Notes (Signed)
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  Patient is hemodynamically stable at this time and can potentially discharge prior to weekend Pain control will be an issue he does not have a primary care physician as that doctor had a stroke-he will need someone to prescribe his opiates and I will forward to Dr. Claiborne Billings for assessment-continue MSIR 15-30, MS Contin 15 twice daily, Robaxin 500 4 times daily and gabapentin 300 3 times daily every 4 as needed CAD 2000 9X3 stents Continue atorvastatin 80 daily, metoprolol 25 twice daily, Plavix 75 daily Hyperkalemia Given a dose of Lokelma on 2/12-potassium is now 5.1-recheck labs a.m. History of squamous cell cancer of parotid and bladder cancer Currently stable at this time needs outpatient surveillance Tobacco abuse Continued smoker-urged to quit HLD Continue atorvastatin 80 daily HTN Continue metoprolol as above   SCD, inpatient, full code discussed with wife completely at the bedside   Verlon Au, MD  [facility] [address] [place] [email or web address] 7PM-7AM contact night coverage as above 10/21/2018, 9:03 AM  LOS: 7 days   Consultants:  Oncology  Radiation oncology  Procedures:  Lung biopsy 2/12  Antimicrobials:  None  Interval history/Subjective: Awake alert  pleasant and 7/10 pain however in the back No fever no chills Tolerating diet   Objective:  Vitals:  Vitals:   10/20/18 2114 10/21/18 0551  BP: 111/66 112/71  Pulse: 89 88  Resp: 16 19  Temp: 97.8 F (36.6 C) 97.6 F (36.4 C)  SpO2: 99% 94%    Exam:  Pleasant malnourished bitemporal wasting no icterus no pallor mild strabismus left eye neck soft supple CTA B no added sound S1-S2 no murmur rub or gallop Abdomen soft nontender no rebound no guarding No lower extremity edema Neurologically intact without any deficit   I have personally reviewed the following:  DATA   Labs:  Potassium down from 5.9-5.1 BUN/creatinine down from 27/1 0.2-20 6/1.01  Hemoglobin 12.3 platelet 183   Review and summation of old records:  Yes extensively reviewed  Scheduled Meds: . acetaminophen  650 mg Oral Q6H  . atorvastatin  80 mg Oral q1800  . calcium carbonate  1 tablet Oral TID WC  . cholecalciferol  2,000 Units Oral BID  . clopidogrel  75 mg Oral Daily  . gabapentin  300 mg Oral TID  . methocarbamol  500 mg Oral QID  . metoprolol tartrate  25 mg Oral BID  . morphine  15 mg Oral Q8H  . multivitamin with minerals  1 tablet Oral Daily  . pantoprazole  40 mg Oral Daily  . polyvinyl alcohol  2 drop Both Eyes QHS  . senna-docusate  2 tablet Oral BID  . sodium chloride flush  3 mL Intravenous Q12H   Continuous Infusions: . sodium chloride      Principal Problem:   Lung mass Active Problems:   Transitional cell  carcinoma ([place])   Essential hypertension   Hyperlipidemia   Tobacco abuse   Atypical chest pain   Cancer associated pain   Malnutrition of moderate degree   Acute left-sided thoracic back pain   Palliative care by specialist   Goals of care, counseling/discussion   Metastatic cancer ([place])   Malignant neoplasm of upper lobe of left lung ([place])   Bone metastases ([place])   LOS: 7 days

## 2018-10-22 ENCOUNTER — Ambulatory Visit
Admit: 2018-10-22 | Discharge: 2018-10-22 | Disposition: A | Discharge status: Home / Self Care | Payer: Medicare Other | Attending: Radiation Oncology | Admitting: Radiation Oncology

## 2018-10-22 ENCOUNTER — Telehealth: Payer: Self-pay | Admitting: Radiation Oncology

## 2018-10-22 LAB — CBC WITH DIFFERENTIAL/PLATELET
ABS IMMATURE GRANULOCYTES: 0.04 10*3/uL (ref 0.00–0.07)
Basophils Absolute: 0 10*3/uL (ref 0.0–0.1)
Basophils Relative: 1 %
Eosinophils Absolute: 0.8 10*3/uL — ABNORMAL HIGH (ref 0.0–0.5)
Eosinophils Relative: 10 %
HCT: 40.6 % (ref 39.0–52.0)
HEMOGLOBIN: 12.9 g/dL — AB (ref 13.0–17.0)
Immature Granulocytes: 1 %
Lymphocytes Relative: 12 %
Lymphs Abs: 1 10*3/uL (ref 0.7–4.0)
MCH: 31.5 pg (ref 26.0–34.0)
MCHC: 31.8 g/dL (ref 30.0–36.0)
MCV: 99 fL (ref 80.0–100.0)
MONO ABS: 0.8 10*3/uL (ref 0.1–1.0)
Monocytes Relative: 11 %
NEUTROS ABS: 5.2 10*3/uL (ref 1.7–7.7)
Neutrophils Relative %: 65 %
Platelets: 203 10*3/uL (ref 150–400)
RBC: 4.1 MIL/uL — ABNORMAL LOW (ref 4.22–5.81)
RDW: 13.1 % (ref 11.5–15.5)
WBC: 7.9 10*3/uL (ref 4.0–10.5)
nRBC: 0 % (ref 0.0–0.2)

## 2018-10-22 LAB — BASIC METABOLIC PANEL
Anion gap: 10 (ref 5–15)
BUN: 27 mg/dL — ABNORMAL HIGH (ref 8–23)
CO2: 28 mmol/L (ref 22–32)
Calcium: 10.4 mg/dL — ABNORMAL HIGH (ref 8.9–10.3)
Chloride: 98 mmol/L (ref 98–111)
Creatinine, Ser: 1.02 mg/dL (ref 0.61–1.24)
GFR calc Af Amer: >60 mL/min (ref >60)
GFR calc non Af Amer: >60 mL/min (ref >60)
GLUCOSE: 86 mg/dL (ref 70–99)
Potassium: 4.6 mmol/L (ref 3.5–5.1)
Sodium: 136 mmol/L (ref 135–145)

## 2018-10-22 MED ORDER — MORPHINE SULFATE ER 15 MG PO TBCR: 15.0000 mg | EXTENDED_RELEASE_TABLET | Freq: Three times a day (TID) | ORAL | 0 refills | Status: AC

## 2018-10-22 MED ORDER — MORPHINE SULFATE 30 MG PO TABS: 30.0000 mg | ORAL_TABLET | ORAL | 0 refills | Status: DC | PRN

## 2018-10-22 MED ORDER — CALCIUM CARBONATE ANTACID 500 MG PO CHEW: 1.0000 | CHEWABLE_TABLET | Freq: Three times a day (TID) | ORAL | 3 refills | Status: AC

## 2018-10-22 MED ORDER — GABAPENTIN 300 MG PO CAPS: 300.0000 mg | ORAL_CAPSULE | Freq: Three times a day (TID) | ORAL | 1 refills | Status: DC

## 2018-10-22 NOTE — Progress Notes (Signed)
Patient given discharge instructions, and verbalized an understanding of all discharge instructions.  Patient agrees with discharge plan, and is being discharged in stable medical condition.  Patient given transportation via wheelchair. 

## 2018-10-22 NOTE — Telephone Encounter (Signed)
Wife, Melissa's, FMLA paperwork was place in an orange folder in the rad onc clerical office to be completed.

## 2018-10-22 NOTE — Care Management Important Message (Signed)
Important Message  Patient Details  Name: DUVALL COMES MRN: [id number] Date of Birth: 1946/05/22   Medicare Important Message Given:  Yes    Kerin Salen 10/22/2018, 12:30 PMImportant Message  Patient Details  Name: DAMIR LEUNG MRN: [id number] Date of Birth: October 30, 1945   Medicare Important Message Given:  Yes    Kerin Salen 10/22/2018, 12:30 PM

## 2018-10-22 NOTE — Discharge Summary (Signed)
Physician Discharge Summary  ACIE CUSTIS BJS:[id number] DOB: 11-16-1945 DOA: 10/14/2018  PCP: [facility]  Admit date: 10/14/2018 Discharge date: 10/22/2018  Time spent: 38 minutes  Recommendations for Outpatient Follow-up:  1. Patient will need oncologist or radiation oncologist to prescribe controlled substances patient was given 5-day supply 2. Consolidation of XRT will occur in the outpatient setting under Dr. Tammi Klippel who I will follow-up and forward this note to 3. Recommend CBC as well as complete metabolic panel in about 1 week 4. 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[place])   Bone metastases ([place])   Discharge Condition: Fair  Diet recommendation: Regular  Filed Weights   10/14/18 0951 10/17/18 0629 10/21/18 0551  Weight: 65.8 kg 66.3 kg 64.4 kg    History of present illness:  73 year old male CAD STEMI 2009 X3 stents HTN CKD Tobacco abuse HLD 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 Tammi Klippel of radiation oncology who will coordinate outpatient follow-up I will CC  note to him I have CCed note to Dr. 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 Currently stable at this time needs outpatient surveillance Tobacco abuse Continued smoker-urged to quit HLD Continue atorvastatin 80 daily HTN Continue metoprolol as above    Discharge Exam: Vitals:   10/22/18 0831 10/22/18 0859  BP: 117/76 106/72  Pulse: 85 74  Resp: 18   Temp: 97.8 F (36.6 C)   SpO2: 94%    No further hemoptysis Looking well Diet is moderate No fever no chills General: Wake alert pleasant no distress EOMI NCAT Cardiovascular: S1-S2 no murmur rub or gallop no icterus no pallor Respiratory: Chest is clear no added sound  Discharge Instructions   Discharge Instructions    Diet - low sodium heart healthy   Complete by:  As directed    Diet - low sodium heart healthy   Complete by:  As directed    Increase activity slowly   Complete by:  As directed    Increase activity slowly   Complete by:  As directed      Allergies as of 10/22/2018      Reactions   Codeine Other (See Comments)   HEADACHE  headaches      Medication List    STOP taking these medications   lisinopril 5 MG tablet Commonly known as:  PRINIVIL,ZESTRIL     TAKE these medications   ARTIFICIAL TEARS OP Apply 1 drop to eye at bedtime.   atorvastatin 80 MG tablet Commonly known as:  LIPITOR Take 1 tablet (80 mg total) by mouth daily at 6 PM.   calcium carbonate 500 MG chewable tablet Commonly known as:  TUMS - dosed in mg elemental calcium Chew 1 tablet (200 mg of elemental  calcium total) by mouth 3 (three) times daily with meals.   clopidogrel 75 MG tablet Commonly known as:  PLAVIX TAKE 1 TABLET (75 MG TOTAL) BY MOUTH DAILY. KEEP APPT FOR 08/06/16   gabapentin 300 MG capsule Commonly known as:  NEURONTIN Take 1 capsule (300 mg total) by mouth 3 (three) times daily. What changed:  how much to take   methocarbamol 500 MG tablet Commonly known as:  ROBAXIN Take 500 mg by mouth 4 (four) times daily.   metoprolol tartrate 25 MG tablet Commonly known as:  LOPRESSOR Take 1 tablet (25 mg total) by mouth 2 (two) times daily. NEED OV. (BETA BLOCKER)   morphine 30 MG tablet Commonly known as:  MSIR Take 1 tablet (30 mg total) by mouth every 4 (four) hours as needed for severe pain.   morphine 15 MG 12 hr tablet Commonly known as:  MS CONTIN Take 1 tablet (15 mg total) by mouth every 8 (eight) hours for 5 days.   pantoprazole 40 MG tablet Commonly known as:  PROTONIX TAKE 1 TABLET (40 MG TOTAL) BY MOUTH DAILY. KEEP OV.   Vitamin D 50 MCG (2000 UT) tablet Take 2,000 Units by mouth 2 (two) times daily.      Allergies  Allergen Reactions  . Codeine Other (See Comments)    HEADACHE  headaches      The results of significant diagnostics from this hospitalization (including imaging, microbiology, ancillary and laboratory) are listed below for reference.    Significant Diagnostic Studies: Ct Angio Chest Pe W/cm &/or Wo Cm  Result Date: 10/14/2018 CLINICAL DATA:  Suspected pulmonary embolus, intermediate probability, positive D-dimer EXAM: CT ANGIOGRAPHY CHEST WITH CONTRAST TECHNIQUE: Multidetector CT imaging of the chest was performed using the standard protocol during bolus administration of intravenous contrast. Multiplanar CT image reconstructions and MIPs were obtained to evaluate the vascular anatomy. CONTRAST:  <See Chart> ISOVUE-370 IOPAMIDOL (ISOVUE-370) INJECTION 76% COMPARISON:  07/01/2008 FINDINGS: Cardiovascular: Heart size normal. No  pericardial effusion. Satisfactory opacification of pulmonary arteries noted, and there is no evidence of pulmonary emboli. Left hilar mass occludes the lower lobe pulmonary artery at its origin, and may occlude or attenuate the inferior left pulmonary vein near its confluence with the right atrium. Moderate coronary calcifications. Aortic valve leaflet calcifications. Adequate contrast opacification of the thoracic aorta with no evidence of dissection, aneurysm, or stenosis. There is patent brachiocephalic arch anatomy without proximal stenosis. Calcified plaque in the aortic arch and descending thoracic segment. Mediastinum/Nodes: 2.7 cm left hilar mass involving left lower lobe pulmonary artery, inferior left pulmonary vein, and mildly attenuating left lower lobe bronchus. No mediastinal adenopathy. Lungs/Pleura: No pleural effusion. No pneumothorax. Pulmonary emphysema with subpleural blebs in both upper lobes. Asymmetric apical pleuroparenchymal scarring left greater than right. 2.7 cm left hilar mass as above. 1.6 cm pleural-based nodule in the lateral basal segment left lower lobe with smaller subcentimeter adjacent satellite nodules. 5.6 cm partially cavitary mass in  the posterior left upper lobe involving the posterior chest wall including posterior aspect left fifth and sixth ribs, and thoracic T5 and T6 vertebral bodies and left pedicles. Upper Abdomen: Stable 8.1 cm upper pole left renal cyst. Right nephrolithiasis without hydronephrosis. No acute findings. Musculoskeletal: Left upper chest mass involving T5 and T6 vertebral bodies and left pedicles with probable epidural extension, as well as left fifth and sixth ribs . No evident fracture. Spondylitic changes in the lower cervical spine. Review of the MIP images confirms the above findings. IMPRESSION: 1. 5.6 cm left upper lobe lesion with chest wall and thoracic spine invasion, possible epidural extension of tumor. 2. 2.7 cm left hilar mass occluding  the left lower lobe pulmonary artery branch and probably attenuating or occluding the inferior left pulmonary vein. 3. Left lower lobe pulmonary nodules possibly metastatic. 4. Negative for acute PE or thoracic aortic dissection. 5. Coronary and Aortic Atherosclerosis (ICD10-170.0). Electronically Signed   By: Lucrezia Europe M.D.   On: 10/14/2018 14:39   Mr Thoracic Spine W Wo Contrast  Result Date: 10/15/2018 CLINICAL DATA:  Left lung masses with destruction of the T5 and T6 vertebral bodies. EXAM: MRI THORACIC WITHOUT AND WITH CONTRAST TECHNIQUE: Multiplanar and multiecho pulse sequences of the thoracic spine were obtained without and with intravenous contrast. CONTRAST:  7.5 cc Gadavist COMPARISON:  Chest CT dated 10/14/2018 and chest x-ray dated 10/14/2018 FINDINGS: MRI THORACIC SPINE FINDINGS Alignment:  Physiologic.  Accentuation of the thoracic kyphosis. Vertebrae: Tumor destroy is the left side of the T5 and T6 vertebral bodies as well as destroying the pedicles and portions of the transverse processes extending into the lamina and adjacent left fifth and sixth ribs. There is also abnormal signal from the posterior aspect of the T8 vertebral body consistent with metastatic disease. There is a slight pathologic compression fracture of the T6 vertebra. Cord: Tumor extends into the spinal canal at T5-6 and slightly compresses the ventral aspect of the left side of the spinal cord best seen on images 11 and 14 of series 20. No myelopathy. Paraspinal and other soft tissues: There is a 6.3 x 4.4 x 4.4 cm irregular inhomogeneous cavitary soft tissue mass which extends from a mass in the superior aspect of the left hilum into the superior medial aspect of the left upper lobe, destroying the adjacent portions of the left fifth and sixth ribs and T5 and T6 vertebra with tumor extension into the spinal canal, filling the left neural foramina at T5-6 and T6-7. The tumor destroys the left facet joint at T5-6. Lobulated  pulmonary nodules are present at the left lung base posterolaterally, best seen on image 21 of series 17. Disc levels: There is diffuse desiccation of the discs in the thoracic spine without protrusion or bulging. No spinal or foraminal stenosis. IMPRESSION: 1. Large cavitary mass arising in the superior aspect of the left hilum and extending into the posteromedial aspect of the left upper lobe invading the left side of the T5 and T6 vertebra and extending into the spinal canal with a slight mass effect upon the spinal cord. There is a slight pathologic compression fracture of the T6 vertebral body. 2. Probable small metastasis in the T8 vertebral body. 3. Metastatic pulmonary nodules at the left lung base posterolaterally. 4. The mass destroys the posterior aspects of the left fifth and sixth ribs. Electronically Signed   By: Lorriane Shire M.D.   On: 10/15/2018 08:46   Ct Biopsy  Result Date: 10/19/2018 INDICATION: 73 year old  with a left lung mass with chest wall and bone invasion. Tissue diagnosis is needed. EXAM: CT-GUIDED CORE BIOPSY OF LEFT LUNG/CHEST MASS MEDICATIONS: None. ANESTHESIA/SEDATION: Moderate (conscious) sedation was employed during this procedure. A total of Versed 1.5 mg and Fentanyl 25 mcg was administered intravenously. Moderate Sedation Time: 17 minutes. The patient's level of consciousness and vital signs were monitored continuously by radiology nursing throughout the procedure under my direct supervision. FLUOROSCOPY TIME:  None COMPLICATIONS: None immediate. PROCEDURE: Informed written consent was obtained from the patient after a thorough discussion of the procedural risks, benefits and alternatives. All questions were addressed. A timeout was performed prior to the initiation of the procedure. Patient was placed prone on the CT scanner. Images through the chest were obtained. The mass involving the left posterior chest wall was targeted. Left side of the back was prepped with  chlorhexidine and sterile field was created. Maximal barrier sterile technique was utilized including caps, mask, sterile gowns, sterile gloves, sterile drape, hand hygiene and skin antiseptic. Skin and soft tissues were anesthetized with 1% lidocaine. 17 gauge coaxial needle was directed into the posterior chest wall mass with CT guidance. Needle position was confirmed within the lesion. Three core biopsies were obtained with an 18 gauge core device. Specimens placed in formalin. Needle was removed without complication. Bandage placed over the puncture site. FINDINGS: Again noted is a mass involving the posterior left lung with invasion into the posterior chest wall and adjacent bones. Needle position confirmed within the soft tissue mass. Adequate core biopsies obtained. IMPRESSION: CT-guided core biopsies of the left posterior lung/chest wall mass. Electronically Signed   By: Markus Daft M.D.   On: 10/19/2018 13:18   Dg Chest Port 1 View  Result Date: 10/19/2018 CLINICAL DATA:  Biopsy of a LEFT UPPER LOBE lung mass earlier today. EXAM: PORTABLE CHEST 1 VIEW COMPARISON:  Chest x-rays 10/14/2018 and earlier. CT chest 10/14/2018 and earlier. FINDINGS: No evidence of pneumothorax after biopsy of the LEFT UPPER LOBE lung mass. The mass in the posteromedial LEFT UPPER LOBE presents as a focal opacity on the chest x-ray. Severe emphysematous changes in both lungs as noted previously. No new pulmonary parenchymal abnormalities. IMPRESSION: No evidence of pneumothorax after biopsy of the LEFT UPPER LOBE lung mass. Electronically Signed   By: Evangeline Dakin M.D.   On: 10/19/2018 11:38   Dg Chest Port 1 View  Result Date: 10/14/2018 CLINICAL DATA:  Chest pain for a few weeks. History of a cardiac stent in 2009. EXAM: PORTABLE CHEST 1 VIEW COMPARISON:  06/14/2018 FINDINGS: Cardiac silhouette is normal in size. No mediastinal or hilar masses. No convincing adenopathy. There is upper lobe scarring, stable. Lungs are  hyperexpanded but otherwise clear. No pleural effusion or pneumothorax. Skeletal structures are grossly intact. IMPRESSION: No acute cardiopulmonary disease. Electronically Signed   By: Lajean Manes M.D.   On: 10/14/2018 10:17    Microbiology: No results found for this or any previous visit (from the past 240 hour(s)).   Labs: Basic Metabolic Panel: Recent Labs  Lab 10/20/18 0212 10/21/18 0616 10/22/18 0608  NA 136 136 136  K 5.9* 5.1 4.6  CL 100 101 98  CO2 27 29 28   GLUCOSE 99 83 86  BUN 27* 26* 27*  CREATININE 1.25* 1.01 1.02  CALCIUM 10.7* 10.1 10.4*   Liver Function Tests: No results for input(s): AST, ALT, ALKPHOS, BILITOT, PROT, ALBUMIN in the last 168 hours. No results for input(s): LIPASE, AMYLASE in the last 168 hours.  No results for input(s): AMMONIA in the last 168 hours. CBC: Recent Labs  Lab 10/20/18 0212 10/22/18 0608  WBC 7.3 7.9  NEUTROABS  --  5.2  HGB 12.3* 12.9*  HCT 38.4* 40.6  MCV 94.8 99.0  PLT 183 203   Cardiac Enzymes: No results for input(s): CKTOTAL, CKMB, CKMBINDEX, TROPONINI in the last 168 hours. BNP: BNP (last 3 results) No results for input(s): BNP in the last 8760 hours.  ProBNP (last 3 results) No results for input(s): PROBNP in the last 8760 hours.  CBG: Recent Labs  Lab 10/16/18 0724 10/17/18 0802 10/18/18 0757 10/19/18 0800  GLUCAP 89 88 70 101*       Signed:  Nita Sells MD   [facility] 10/22/2018, 9:49 AM

## 2018-10-22 NOTE — Care Management Important Message (Signed)
Important Message  Patient Details  Name: Kyle Foster MRN: [id number] Date of Birth: 02/21/1946   Medicare Important Message Given:  Yes    Kerin Salen 10/22/2018, 12:36 [place] Message  Patient Details  Name: Kyle Foster MRN: [id number] Date of Birth: 08-25-1946   Medicare Important Message Given:  Yes    Kerin Salen 10/22/2018, 12:36 PM

## 2018-10-22 NOTE — Care Management Note (Signed)
Case Management Note  Patient Details  Name: Kyle Foster MRN: [id number] Date of Birth: 03/25/1946  Subjective/Objective:                  discharged  Action/Plan: Discharged to home with self-care, orders checked for hhc needs. No CM needs present at time of discharge.  Patient is able to arrangement own appointments and home care.  Expected Discharge Date:  10/22/18               Expected Discharge Plan:  Home/Self Care  In-House Referral:     Discharge planning Services  CM Consult  Post Acute Care Choice:    Choice offered to:     DME Arranged:    DME Agency:     HH Arranged:    HH Agency:     Status of Service:  In process, will continue to follow  If discussed at Long Length of Stay Meetings, dates discussed:    Additional Comments:  Leeroy Cha, RN 10/22/2018, 11:11 AM

## 2018-10-24 NOTE — Progress Notes (Signed)
  Radiation Oncology         [phone] ________________________________  Name: Kyle Foster MRN: [id number]  Date: 10/20/2018  DOB: July 30, 1946  Inpatient  SIMULATION AND TREATMENT PLANNING NOTE    ICD-10-CM   1. Malignant neoplasm of upper lobe of left lung (HCC) C34.12     DIAGNOSIS:  73 yo man with left upper lung cancer invading spine at risk for cord compression  NARRATIVE:  The patient was brought to the [facility].  Identity was confirmed.  All relevant records and images related to the planned course of therapy were reviewed.  The patient freely provided informed written consent to proceed with treatment after reviewing the details related to the planned course of therapy. The consent form was witnessed and verified by the simulation staff.  Then, the patient was set-up in a stable reproducible  supine position for radiation therapy.  CT images were obtained.  Surface markings were placed.  The CT images were loaded into the planning software.  Then the target and avoidance structures were contoured.  Treatment planning then occurred.  The radiation prescription was entered and confirmed.  Then, I designed and supervised the construction of medically necessary complex treatment devices, including a BodyFix immobilization mold custom fitted to the patient along with multileaf collimators conformally shaped radiation around the treatment target while shielding critical structures such as the heart and spinal cord maximally.  I have requested : 3D Simulation  I have requested a DVH of the following structures: Left lung, right lung, spinal cord, heart, esophagus, and target.  I have ordered:Nutrition Consult  PLAN:  The patient will receive 30 Gy in 10 fractions.  ________________________________  Sheral Apley Tammi Klippel, M.D.

## 2018-10-25 ENCOUNTER — Ambulatory Visit
Admission: RE | Admit: 2018-10-25 | Discharge: 2018-10-25 | Disposition: A | Discharge status: Home / Self Care | Payer: Medicare Other | Source: Ambulatory Visit | Attending: Radiation Oncology | Admitting: Radiation Oncology

## 2018-10-25 ENCOUNTER — Encounter: Payer: Self-pay | Admitting: *Deleted

## 2018-10-25 DIAGNOSIS — Z510 Encounter for antineoplastic radiation therapy: Secondary | ICD-10-CM | POA: Diagnosis not present

## 2018-10-25 DIAGNOSIS — C7951 Secondary malignant neoplasm of bone: Secondary | ICD-10-CM | POA: Diagnosis not present

## 2018-10-25 DIAGNOSIS — C3412 Malignant neoplasm of upper lobe, left bronchus or lung: Secondary | ICD-10-CM | POA: Diagnosis not present

## 2018-10-25 NOTE — Progress Notes (Signed)
Oncology Nurse Navigator Documentation  Oncology Nurse Navigator Flowsheets 10/25/2018  Navigator Location [facility]  Referral date to RadOnc/MedOnc 10/22/2018  Navigator Encounter Type Other/I received referral on Mr. Thurow and updated Dr. Julien Nordmann. He will see patient on 10/27/2018.  I updated new patient coordinator to call and schedule.    Barriers/Navigation Needs Coordination of Care  Interventions Coordination of Care  Coordination of Care Other  Acuity Level 2  Time Spent with Patient 15

## 2018-10-26 ENCOUNTER — Ambulatory Visit
Admission: RE | Admit: 2018-10-26 | Discharge: 2018-10-26 | Disposition: A | Discharge status: Home / Self Care | Payer: Medicare Other | Source: Ambulatory Visit | Attending: Radiation Oncology | Admitting: Radiation Oncology

## 2018-10-26 ENCOUNTER — Encounter: Payer: Self-pay | Admitting: *Deleted

## 2018-10-26 ENCOUNTER — Telehealth: Payer: Self-pay | Admitting: *Deleted

## 2018-10-26 DIAGNOSIS — C7951 Secondary malignant neoplasm of bone: Secondary | ICD-10-CM | POA: Diagnosis not present

## 2018-10-26 DIAGNOSIS — Z510 Encounter for antineoplastic radiation therapy: Secondary | ICD-10-CM | POA: Diagnosis not present

## 2018-10-26 DIAGNOSIS — C3412 Malignant neoplasm of upper lobe, left bronchus or lung: Secondary | ICD-10-CM | POA: Diagnosis not present

## 2018-10-26 NOTE — Progress Notes (Signed)
Oncology Nurse Navigator Documentation  Oncology Nurse Navigator Flowsheets 10/26/2018  Navigator Location [facility]  Navigator Encounter Type Other/I received referral on [name] last week.  I had scheduled him to see Dr. Julien Nordmann on 10/27/2018.  I followed up on his records and information.  Patient saw Dr. [name] in the hospital. I updated Dr. [name] and new patient coordinator.  The appt with Dr. Julien Nordmann is cancelled and new patient coordinator will schedule with Dr. [name].    Barriers/Navigation Needs Coordination of Care  Interventions Coordination of Care  Coordination of Care Other  Acuity Level 2  Time Spent with Patient 15

## 2018-10-26 NOTE — Telephone Encounter (Signed)
Oncology Nurse Navigator Documentation  Oncology Nurse Navigator Flowsheets 10/26/2018  Navigator Location [facility]  Navigator Encounter Type Telephone/I called Mr. Lemon to update him that his appt with Dr. Julien Nordmann is cancelled for tomorrow.  I was unable to reach but did leave v vm message with the above information.  I also left my name and phone number to call if he had any questions.    Telephone Outgoing Call  Barriers/Navigation Needs Education;Coordination of Care  Education Other  Interventions Coordination of Care;Education  Coordination of Care Other  Education Method Verbal  Acuity Level 1  Time Spent with Patient 15

## 2018-10-27 ENCOUNTER — Other Ambulatory Visit: Payer: Medicare Other

## 2018-10-27 ENCOUNTER — Ambulatory Visit
Admission: RE | Admit: 2018-10-27 | Discharge: 2018-10-27 | Disposition: A | Discharge status: Home / Self Care | Payer: Medicare Other | Source: Ambulatory Visit | Attending: Radiation Oncology | Admitting: Radiation Oncology

## 2018-10-27 ENCOUNTER — Telehealth: Payer: Self-pay | Admitting: Hematology

## 2018-10-27 ENCOUNTER — Ambulatory Visit: Payer: Medicare Other | Admitting: Internal Medicine

## 2018-10-27 DIAGNOSIS — C7951 Secondary malignant neoplasm of bone: Secondary | ICD-10-CM | POA: Diagnosis not present

## 2018-10-27 DIAGNOSIS — C3412 Malignant neoplasm of upper lobe, left bronchus or lung: Secondary | ICD-10-CM | POA: Diagnosis not present

## 2018-10-27 DIAGNOSIS — Z510 Encounter for antineoplastic radiation therapy: Secondary | ICD-10-CM | POA: Diagnosis not present

## 2018-10-27 NOTE — Progress Notes (Signed)
FMLA for spouse, Kruze Atchley, mailed to patient address on file. No fax number provided to fax forms to.

## 2018-10-27 NOTE — Telephone Encounter (Signed)
A hospital follow up appt has been scheduled for the pt to see Dr. [name] on 3/3 at 11am. Pt has agreed to the appt date and time nd preferred to schedule after his radiation treatment.

## 2018-10-28 ENCOUNTER — Other Ambulatory Visit: Payer: Self-pay | Admitting: Radiation Oncology

## 2018-10-28 ENCOUNTER — Ambulatory Visit
Admission: RE | Admit: 2018-10-28 | Discharge: 2018-10-28 | Disposition: A | Discharge status: Home / Self Care | Payer: Medicare Other | Source: Ambulatory Visit | Attending: Radiation Oncology | Admitting: Radiation Oncology

## 2018-10-28 ENCOUNTER — Telehealth: Payer: Self-pay | Admitting: Radiation Oncology

## 2018-10-28 DIAGNOSIS — C3490 Malignant neoplasm of unspecified part of unspecified bronchus or lung: Secondary | ICD-10-CM

## 2018-10-28 DIAGNOSIS — Z510 Encounter for antineoplastic radiation therapy: Secondary | ICD-10-CM | POA: Diagnosis not present

## 2018-10-28 DIAGNOSIS — C3412 Malignant neoplasm of upper lobe, left bronchus or lung: Secondary | ICD-10-CM | POA: Diagnosis not present

## 2018-10-28 DIAGNOSIS — C7951 Secondary malignant neoplasm of bone: Secondary | ICD-10-CM | POA: Diagnosis not present

## 2018-10-28 NOTE — Telephone Encounter (Signed)
Phoned patient's home as requested by Shona Simpson, PA-C. No answer. Left voicemail message that Shona Simpson, PA-C has placed orders for imaging now that he has been discharged from the hospital. Martin Majestic onto explain that once prior authorization has been obtained from [employer] our schedulers will be calling to set up these scan appointments. Also, explained that per system notes his wife's FMLA paperwork was mailed to their home yesterday. Left my direct contact information for future questions.

## 2018-10-29 ENCOUNTER — Other Ambulatory Visit: Payer: Self-pay | Admitting: Radiation Oncology

## 2018-10-29 ENCOUNTER — Telehealth: Payer: Self-pay | Admitting: Radiation Oncology

## 2018-10-29 ENCOUNTER — Ambulatory Visit
Admission: RE | Admit: 2018-10-29 | Discharge: 2018-10-29 | Disposition: A | Discharge status: Home / Self Care | Payer: Medicare Other | Source: Ambulatory Visit | Attending: Radiation Oncology | Admitting: Radiation Oncology

## 2018-10-29 ENCOUNTER — Telehealth: Payer: Self-pay | Admitting: *Deleted

## 2018-10-29 DIAGNOSIS — C7951 Secondary malignant neoplasm of bone: Secondary | ICD-10-CM | POA: Diagnosis not present

## 2018-10-29 DIAGNOSIS — C3412 Malignant neoplasm of upper lobe, left bronchus or lung: Secondary | ICD-10-CM | POA: Diagnosis not present

## 2018-10-29 DIAGNOSIS — Z510 Encounter for antineoplastic radiation therapy: Secondary | ICD-10-CM | POA: Diagnosis not present

## 2018-10-29 MED ORDER — PROCHLORPERAZINE MALEATE 10 MG PO TABS: 10.0000 mg | ORAL_TABLET | Freq: Four times a day (QID) | ORAL | 0 refills | Status: DC | PRN

## 2018-10-29 MED ORDER — MORPHINE SULFATE ER 15 MG PO TBCR: 15.0000 mg | EXTENDED_RELEASE_TABLET | Freq: Three times a day (TID) | ORAL | 0 refills | Status: DC

## 2018-10-29 MED ORDER — MORPHINE SULFATE 15 MG PO TABS: 15.0000 mg | ORAL_TABLET | Freq: Four times a day (QID) | ORAL | 0 refills | Status: DC | PRN

## 2018-10-29 NOTE — Telephone Encounter (Signed)
SPOKE WITH PATIENT REGARDING MRI AND PET, MRI WILL BE ON 11/04/18 - ARRIVAL TIME- 11:30 AM @ [facility], NO RESTRICTIONS TO TEST AND HIS PET WILL BE ON 11-10-18 - ARRIVAL TIME - 11:30 AM @ [facility], PT. TO BE NPO- 6 HRS. PRIOR TO TEST, SPOKE WITH PATIENT AND HE AGREED TO THESE TESTS - APPT. DAY AND TIME

## 2018-10-29 NOTE — Telephone Encounter (Addendum)
Returned wife's call. Explained our staff would work to obtain the prior authorization from the [place] before imaging is scheduled. Offered to have financial staff members, Ailene Ravel or [place], and she agrees. Also, reassured her that her husband would be evaluated by Dr. Tammi Klippel following his radiation treatment today. She reports his pain level has been up these last few days. Also, she reports he is having nausea and unable to eat.

## 2018-11-01 ENCOUNTER — Ambulatory Visit
Admission: RE | Admit: 2018-11-01 | Discharge: 2018-11-01 | Disposition: A | Discharge status: Home / Self Care | Payer: Medicare Other | Source: Ambulatory Visit | Attending: Radiation Oncology | Admitting: Radiation Oncology

## 2018-11-01 DIAGNOSIS — Z510 Encounter for antineoplastic radiation therapy: Secondary | ICD-10-CM | POA: Diagnosis not present

## 2018-11-01 DIAGNOSIS — C3412 Malignant neoplasm of upper lobe, left bronchus or lung: Secondary | ICD-10-CM | POA: Diagnosis not present

## 2018-11-01 DIAGNOSIS — C7951 Secondary malignant neoplasm of bone: Secondary | ICD-10-CM | POA: Diagnosis not present

## 2018-11-02 ENCOUNTER — Telehealth: Payer: Self-pay | Admitting: Radiation Oncology

## 2018-11-02 ENCOUNTER — Ambulatory Visit
Admission: RE | Admit: 2018-11-02 | Discharge: 2018-11-02 | Disposition: A | Discharge status: Home / Self Care | Payer: Medicare Other | Source: Ambulatory Visit | Attending: Radiation Oncology | Admitting: Radiation Oncology

## 2018-11-02 DIAGNOSIS — C3412 Malignant neoplasm of upper lobe, left bronchus or lung: Secondary | ICD-10-CM | POA: Diagnosis not present

## 2018-11-02 DIAGNOSIS — C7951 Secondary malignant neoplasm of bone: Secondary | ICD-10-CM | POA: Diagnosis not present

## 2018-11-02 DIAGNOSIS — Z510 Encounter for antineoplastic radiation therapy: Secondary | ICD-10-CM | POA: Diagnosis not present

## 2018-11-02 NOTE — Telephone Encounter (Signed)
As requested by patient's wife, Lenna Sciara, I faxed a copy of her completed FMLA paperwork to her employer using the fax number she provided of [phone]. Fax confirmation of delivery obtained.

## 2018-11-03 ENCOUNTER — Ambulatory Visit
Admission: RE | Admit: 2018-11-03 | Discharge: 2018-11-03 | Disposition: A | Discharge status: Home / Self Care | Payer: Medicare Other | Source: Ambulatory Visit | Attending: Radiation Oncology | Admitting: Radiation Oncology

## 2018-11-03 DIAGNOSIS — Z510 Encounter for antineoplastic radiation therapy: Secondary | ICD-10-CM | POA: Diagnosis not present

## 2018-11-03 DIAGNOSIS — C7951 Secondary malignant neoplasm of bone: Secondary | ICD-10-CM | POA: Diagnosis not present

## 2018-11-03 DIAGNOSIS — C3412 Malignant neoplasm of upper lobe, left bronchus or lung: Secondary | ICD-10-CM | POA: Diagnosis not present

## 2018-11-04 ENCOUNTER — Telehealth: Payer: Self-pay | Admitting: Radiation Oncology

## 2018-11-04 ENCOUNTER — Telehealth: Payer: Self-pay | Admitting: Cardiovascular Disease

## 2018-11-04 ENCOUNTER — Ambulatory Visit (HOSPITAL_COMMUNITY)
Admission: RE | Admit: 2018-11-04 | Discharge: 2018-11-04 | Disposition: A | Discharge status: Home / Self Care | Payer: Medicare Other | Source: Ambulatory Visit | Attending: Radiation Oncology | Admitting: Radiation Oncology

## 2018-11-04 DIAGNOSIS — C3490 Malignant neoplasm of unspecified part of unspecified bronchus or lung: Secondary | ICD-10-CM

## 2018-11-04 MED ORDER — OSELTAMIVIR PHOSPHATE 75 MG PO CAPS: 75.0000 mg | ORAL_CAPSULE | Freq: Every day | ORAL | 0 refills | Status: AC

## 2018-11-04 MED ORDER — ATORVASTATIN CALCIUM 80 MG PO TABS: 80.0000 mg | ORAL_TABLET | Freq: Every day | ORAL | 0 refills | Status: DC

## 2018-11-04 MED ORDER — CLOPIDOGREL BISULFATE 75 MG PO TABS: 75.0000 mg | ORAL_TABLET | Freq: Every day | ORAL | 0 refills | Status: DC

## 2018-11-04 MED ORDER — PANTOPRAZOLE SODIUM 40 MG PO TBEC: 40.0000 mg | DELAYED_RELEASE_TABLET | Freq: Every day | ORAL | 0 refills | Status: DC

## 2018-11-04 NOTE — Telephone Encounter (Signed)
Rx(s) sent to pharmacy electronically.  

## 2018-11-04 NOTE — Telephone Encounter (Signed)
New message    *STAT* If patient is at the pharmacy, call can be transferred to refill team.   1. Which medications need to be refilled? (please list name of each medication and dose if known)  atorvastatin (LIPITOR) 80 MG tablet  clopidogrel (PLAVIX) 75 MG tablet   pantoprazole (PROTONIX) 40 MG tablet  2. Which pharmacy/location (including street and city if local pharmacy) is medication to be sent to?[id number] - [place], [place] - [address]  3. Do they need a 30 day or 90 day supply? [place]

## 2018-11-04 NOTE — Telephone Encounter (Signed)
Phoned patient's home and spoke with his wife, Lenna Sciara. Explained that on her husband's last day of treatment he was exposed to a staff member who has seen been diagnosed with flu, type B. Explained that Tamiflu is available to be picked up for free at [facility]. Melissa requested [facility] [place]. This Pryor Curia committed to doing so. Also, mailed letter detailing reimbursement directions to patient's home. Finally, delivered FMLA paperwork for son, Larkin Ina, to the front office to be completed.

## 2018-11-04 NOTE — Telephone Encounter (Signed)
Phoned Tamiflu 75 mg once daily x 7 days to [facility] [place] per Dr. Johny Shears order.

## 2018-11-05 ENCOUNTER — Encounter: Payer: Self-pay | Admitting: Radiation Oncology

## 2018-11-05 NOTE — Progress Notes (Signed)
  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 He reported back pain at a 8/10, generalized weakness, and shortness of breath throughout his treatment.  He denied numbness or tingling in his lower extremities. Towards the end of treatment, he reported moderate fatigue, nausea, dry mouth, and thick saliva.   Plan: The patient has completed radiation treatment. The patient will return to radiation oncology clinic for routine followup in one month. I advised him to call or return sooner if he has any questions or concerns related to his recovery or treatment. ________________________________  Sheral Apley. Tammi Klippel, M.D.  This document serves as a record of services personally performed by Tyler Pita, MD. It was created on his behalf by Wilburn Mylar, a trained medical scribe. The creation of this record is based on the scribe's personal observations and the provider's statements to them. This document has been checked and approved by the attending provider.

## 2018-11-06 ENCOUNTER — Ambulatory Visit (HOSPITAL_COMMUNITY)
Admission: RE | Admit: 2018-11-06 | Discharge: 2018-11-06 | Disposition: A | Discharge status: Home / Self Care | Payer: Medicare Other | Source: Ambulatory Visit | Attending: Radiation Oncology | Admitting: Radiation Oncology

## 2018-11-06 DIAGNOSIS — C3490 Malignant neoplasm of unspecified part of unspecified bronchus or lung: Secondary | ICD-10-CM | POA: Diagnosis not present

## 2018-11-06 MED ORDER — GADOBUTROL 1 MMOL/ML IV SOLN
7.5000 mL | Freq: Once | INTRAVENOUS | Status: AC | PRN
  Administered 2018-11-06: 7.5 mL via INTRAVENOUS

## 2018-11-08 ENCOUNTER — Telehealth: Payer: Self-pay | Admitting: Radiation Oncology

## 2018-11-08 MED ORDER — ONDANSETRON HCL 8 MG PO TABS: 8.0000 mg | ORAL_TABLET | Freq: Three times a day (TID) | ORAL | 1 refills | Status: DC | PRN

## 2018-11-08 NOTE — Telephone Encounter (Signed)
I called the patient and his wife let us know his MRI result. She states he has been sick on his stomach and his wife is concerned about his intake and I offered Zofran in addition to his compazine. I encouraged them to let us know and to discuss further with Dr. [name].

## 2018-11-08 NOTE — Telephone Encounter (Signed)
The patient's wife called back and said her husband was having more constipation and also nausea but would like another rx for nausea. I sent in zofran to his pharmacy. We discussed milk of magnesia for his bowels then daily miralax once he gets his bowels moving again, versus miralax and an enema. His was was counseled on constipation as well with zofran.

## 2018-11-09 ENCOUNTER — Inpatient Hospital Stay: Payer: Medicare Other | Attending: Hematology | Admitting: Hematology

## 2018-11-09 ENCOUNTER — Telehealth: Payer: Self-pay | Admitting: Hematology

## 2018-11-09 VITALS — BP 104/62 | HR 92 | Temp 98.1°F | Resp 18 | Ht 71.0 in | Wt 132.8 lb

## 2018-11-09 DIAGNOSIS — C3412 Malignant neoplasm of upper lobe, left bronchus or lung: Secondary | ICD-10-CM | POA: Insufficient documentation

## 2018-11-09 DIAGNOSIS — Z5189 Encounter for other specified aftercare: Secondary | ICD-10-CM | POA: Insufficient documentation

## 2018-11-09 DIAGNOSIS — R0602 Shortness of breath: Secondary | ICD-10-CM | POA: Insufficient documentation

## 2018-11-09 DIAGNOSIS — Z720 Tobacco use: Secondary | ICD-10-CM | POA: Diagnosis not present

## 2018-11-09 DIAGNOSIS — K5900 Constipation, unspecified: Secondary | ICD-10-CM | POA: Diagnosis not present

## 2018-11-09 DIAGNOSIS — C7951 Secondary malignant neoplasm of bone: Secondary | ICD-10-CM | POA: Insufficient documentation

## 2018-11-09 DIAGNOSIS — R630 Anorexia: Secondary | ICD-10-CM | POA: Diagnosis not present

## 2018-11-09 DIAGNOSIS — M545 Low back pain: Secondary | ICD-10-CM | POA: Diagnosis not present

## 2018-11-09 DIAGNOSIS — R634 Abnormal weight loss: Secondary | ICD-10-CM | POA: Diagnosis not present
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
## 2018-11-09 NOTE — Telephone Encounter (Signed)
Gave avs and calendar ° °

## 2018-11-10 ENCOUNTER — Ambulatory Visit (HOSPITAL_COMMUNITY)
Admission: RE | Admit: 2018-11-10 | Discharge: 2018-11-10 | Disposition: A | Discharge status: Home / Self Care | Payer: No Typology Code available for payment source | Source: Ambulatory Visit | Attending: Radiation Oncology | Admitting: Radiation Oncology

## 2018-11-10 ENCOUNTER — Telehealth: Payer: Self-pay | Admitting: Radiation Oncology

## 2018-11-10 ENCOUNTER — Telehealth: Payer: Self-pay | Admitting: *Deleted

## 2018-11-10 DIAGNOSIS — C3490 Malignant neoplasm of unspecified part of unspecified bronchus or lung: Secondary | ICD-10-CM

## 2018-11-10 LAB — GLUCOSE, CAPILLARY: Glucose-Capillary: 113 mg/dL — ABNORMAL HIGH (ref 70–99)

## 2018-11-10 MED ORDER — FLUDEOXYGLUCOSE F - 18 (FDG) INJECTION
7.8000 | Freq: Once | INTRAVENOUS | Status: AC | PRN
  Administered 2018-11-10: 7.8 via INTRAVENOUS

## 2018-11-10 NOTE — Progress Notes (Signed)
FMLA for son, Harjit Leider, successfully faxed to [employer] at [phone]. Mailed copy to patient address on file.

## 2018-11-10 NOTE — Telephone Encounter (Signed)
On 11-10-18 [facility], it was consult note, end of tx note, sim and planning note,

## 2018-11-10 NOTE — Telephone Encounter (Signed)
Received voicemail message from patient's wife, Lenna Sciara, requesting return call. Phoned her back. She questions if her son's FMLA paperwork is complete. Explained there is no note indicating it is. Provided her with [employer] contact number and explained she completes all FMLA paperwork. Called Erline Levine and left her a detailed message reference the conversation I had with Melissa. Finally confirmed reimbursement directions for Tamiflu had been mailed to her on 11/04/2018. She verbalized understanding of all reviewed and expressed appreciation for the return call.

## 2018-11-11 ENCOUNTER — Telehealth: Payer: Self-pay | Admitting: Radiation Oncology

## 2018-11-11 ENCOUNTER — Telehealth: Payer: Self-pay | Admitting: *Deleted

## 2018-11-11 NOTE — Telephone Encounter (Signed)
I called the patient's home to review his PET imaging but had to leave a message.

## 2018-11-11 NOTE — Telephone Encounter (Signed)
Medical records faxed to [facility] [place]) [place]; [place] [phone]

## 2018-11-12 ENCOUNTER — Telehealth: Payer: Self-pay | Admitting: Radiation Oncology

## 2018-11-12 ENCOUNTER — Inpatient Hospital Stay: Payer: Medicare Other | Admitting: Nutrition

## 2018-11-12 NOTE — Telephone Encounter (Signed)
Returned voicemail message left by patient's son, Larkin Ina. Larkin Ina phoned to inquire about status of FMLA paperwork. Assured him paperwork was faxed to [facility] and a copied mailed to his parent's home on 3/4 per Leodis Liverpool note. Larkin Ina verbalized understanding and expressed appreciation for the return call.

## 2018-11-12 NOTE — Progress Notes (Signed)
73 year old male diagnosed with lung cancer with bone metastases.  Past medical history includes bladder cancer, CAD, parotid gland cancer, hyperlipidemia, hypertension, and MI.  Medications include vitamin D, Lipitor, Zofran, Protonix, and Compazine.  Labs were reviewed.  Height: 5 feet 11 inches. Weight: 132.8 pounds on March 3. Usual body weight: 152 pounds in October 2019. BMI: 18.52.  Patient reports increased fatigue.  Wife reports patient sleeps a lot. He has had constipation but had a bowel movement yesterday. Reports esophagitis after radiation therapy. Reports decreased appetite, taste alterations and early satiety. Endorses weight loss of about 20 pounds over 5 months.  This is a 13% weight loss and is significant.  Nutrition diagnosis: Unintended weight loss related to metastatic lung cancer as evidenced by 13% weight loss.  Intervention: Patient was educated to consume smaller more frequent meals and snacks with adequate calories and protein. Recommended patient consume oral nutrition supplements 3 times daily between meals and provided samples. Reviewed strategies for taste alterations and fact sheets. Questions were answered.  Teach back method used.  Contact information given.  Monitoring, evaluation, goals: Patient will increase oral intake to minimize further weight loss.  Next visit: To be scheduled with upcoming treatment.  Patient also has my contact information for questions or concerns.  **Disclaimer: This note was dictated with voice recognition software. Similar sounding words can inadvertently be transcribed and this note may contain transcription errors which may not have been corrected upon publication of note.**

## 2018-11-16 ENCOUNTER — Other Ambulatory Visit: Payer: Self-pay | Admitting: Hematology

## 2018-11-16 NOTE — Progress Notes (Signed)
HEMATOLOGY/ONCOLOGY CONSULTATION NOTE  Date of Service: 11/17/2018  Patient Care Team: [facility] as PCP - General Gwenlyn Found Pearletha Forge, MD as PCP - Cardiology (Cardiology)  CHIEF COMPLAINTS/PURPOSE OF CONSULTATION:  Squamous cell lung cancer  HISTORY OF PRESENTING ILLNESS:    Kyle Foster is a wonderful 73 y.o. male who has been referred to [place] by Dr. Karie Kirks for evaluation and management of Lung Mass concerning for primary lung cancer.   he pt reports he has been having back pain on and off for about 3 months and was being worked up at the [facility] in [place] by his PCP . He reports it was being maanged as MSK pain and he was referred to PT but the symptoms got progressively worse. He presented to the ED on 10/14/18 with SOB and midsternal chest pain, neck pain, and back pain which had been constant for the last 2-3 weeks.   Of note prior to the patient's visit today, pt has had a CTA Chest completed on 10/14/18 with results revealing 5.6 cm left upper lobe lesion with chest wall and thoracic spine invasion, possible epidural extension of tumor. 2. 2.7 cm left hilar mass occluding the left lower lobe pulmonary artery branch and probably attenuating or occluding the inferior left pulmonary vein. 3. Left lower lobe pulmonary nodules possibly metastatic. 4. Negative for acute PE or thoracic aortic dissection. 5. Coronary and Aortic Atherosclerosis.  Most recent lab results (10/15/18) of CBC is as follows: all values are WNL except for RBC at 3.76, [name], [name], Glucose at 111.  He subsequently had an MRI of the T spine which showed Large cavitary mass arising in the superior aspect of the left hilum and extending into the posteromedial aspect of the left upper lobe invading the left side of the T5 and T6 vertebra and extending into the spinal canal with a slight mass effect upon the spinal cord. There is a slight pathologic compression fracture of the T6 vertebral body.  2. Probable small metastasis in the T8 vertebral body. 3. Metastatic pulmonary nodules at the left lung base posterolaterally. 4. The mass destroys the posterior aspects of the left fifth and sixth ribs.  Patient has had a h/o localized bladder cancer s/p TURBT in 2009. 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  Interval History:   Kyle Foster returns today for management and evaluation of his Squamous Cell Carcinoma Lung Cancer. The patient's last visit with [place] was on 11/09/18. He is accompanied today by his wife. The pt reports that he is doing well overall.  The pt reports that his back pain is being less covered by the pain medication he was discharged from the hospital with. He notes that he is having more of the "aching" feeling that he had prior to admission. The pt notes that he is taking his break through medications every 6 hours between 7am-11pm. He notes that he feels fatigued, and has "no activity at all."  The pt notes that his pain is limiting his quality of life currently, as he is sitting most of the day and "not doing anything." He notes that he is physically able to walk around, and is used to "being an active guy."  The pt notes that he was able to meet with our nutritional therapist in the interim, and has begun Boost supplements. The pt notes that he is having a "small  bowel movement every 3 days." He has been taking miralax but not Senna S. He has gained 2 pounds in the last week.  The pt ha a cough intermittently, and is coughing up a "spoonful" of blood about 3 times a day, which he notes is thick.  Of note since the patient's last visit, pt has had a PET/CT completed on 11/10/18 with results revealing Locally advanced left lung mass, as detailed previously. 2. Left perihilar direct extension versus nodal metastasis. 3. No findings of hypermetabolic distant metastasis. 4. No findings of extrathoracic  hypermetabolic metastasis.  Lab results today (11/17/18) of CBC w/diff and CMP is as follows: all values are WNL except for WBC at 14.7k, RBC at 3.81, [name], HCT at 37.1, ANC at 12.8k, [name], Monocytes abs at 1.2k, Abs immature granulocytes at 0.08k, Glucose at 104, Albumin at 2.9. 11/17/18 Vitamin D 25 hydroxy is pending 11/17/18 Phosphorous is normal at 4.4  On review of systems, pt reports constipation, back pain, fatigue, weak appetite, cough, coughing up blood, some weight gain, eating better, and denies any other symptoms.   MEDICAL HISTORY:  Past Medical History:  Diagnosis Date  . Bladder cancer ([place]) 10/2008  . CAD (coronary artery disease)   . Cancer of parotid gland ([place]) 12/2009   "squamous cell cancer attached to it; took the gland out"  . History of kidney stones   . Hyperlipidemia   . Hypertension   . Myocardial infarction ([place]) 06/2008  . Recurrent upper respiratory infection (URI)    09/01/11 saw PCP - Kathryne Eriksson , antibiotic  and prednisone   . Skin cancer    "cut & burned off arms, hands, face, neck" (06/14/2018)    SURGICAL HISTORY: Past Surgical History:  Procedure Laterality Date  . CATARACT EXTRACTION W/ INTRAOCULAR LENS IMPLANT Right 12/2007  . CORONARY ANGIOPLASTY WITH STENT PLACEMENT  06/2008   "3 stents" (06/14/2018)  . CYSTOSCOPY W/ RETROGRADES  09/22/2011   Procedure: CYSTOSCOPY WITH RETROGRADE PYELOGRAM;  Surgeon: Bernestine Amass, MD;  Location: [facility];  Service: Urology;  Laterality: Left;  Cystoscopy left Retrograde Pyelogram      (c-arm)   . CYSTOSCOPY WITH BIOPSY  09/22/2011   Procedure: CYSTOSCOPY WITH BIOPSY;  Surgeon: Bernestine Amass, MD;  Location: [facility];  Service: Urology;  Laterality: N/A;   Biopsy  . EXCISIONAL HEMORRHOIDECTOMY  ~ 2006  . EYE SURGERY Left 04/2011   "reconstruction; gold weight in eye lid " (06/14/2018)  . FOOT NEUROMA SURGERY Left   . INGUINAL HERNIA REPAIR Right 02/2018  . NM MYOCAR PERF WALL MOTION   07/11/2008   MILD ISCHEMIA IN THE BASL INFERIOR, MID INFERIOR & APICAL INFERIOR REGIONS  . 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 SKIN CANCER EXCISION     "arms, hands, face, neck" (06/14/2018)  . TRANSURETHRAL RESECTION OF BLADDER TUMOR WITH GYRUS (TURBT-GYRUS)  2010    SOCIAL HISTORY: Social History   Socioeconomic History  . Marital status: Married    Spouse name: Not on file  . Number of children: Not on file  . Years of education: Not on file  . Highest education level: Not on file  Occupational History  . Not on file  Social Needs  . Financial resource strain: Not on file  . Food insecurity:    Worry: Not on file    Inability: Not on file  . Transportation needs:  Medical: Not on file    Non-medical: Not on file  Tobacco Use  . Smoking status: Current Every Day Smoker    Packs/day: 0.50    Years: 50.00    Pack years: 25.00    Types: Cigarettes  . Smokeless tobacco: Never Used  Substance and Sexual Activity  . Alcohol use: Not Currently  . Drug use: Not Currently  . Sexual activity: Not on file  Lifestyle  . Physical activity:    Days per week: Not on file    Minutes per session: Not on file  . Stress: Not on file  Relationships  . Social connections:    Talks on phone: Not on file    Gets together: Not on file    Attends religious service: Not on file    Active member of club or organization: Not on file    Attends meetings of clubs or organizations: Not on file    Relationship status: Not on file  . Intimate partner violence:    Fear of current or ex partner: Not on file    Emotionally abused: Not on file    Physically abused: Not on file    Forced sexual activity: Not on file  Other Topics Concern  . Not on file  Social History Narrative  . Not on file    FAMILY HISTORY: Family History  Problem Relation Age of Onset  . Heart attack Mother 18  . Cancer Mother   . Stroke Father 58    . Brain cancer Brother     ALLERGIES:  is allergic to codeine.  MEDICATIONS:  Current Outpatient Medications  Medication Sig Dispense Refill  . atorvastatin (LIPITOR) 80 MG tablet Take 1 tablet (80 mg total) by mouth daily at 6 PM. NEEDS APPOINTMENT FOR FUTURE REFILLS 30 tablet 0  . calcium carbonate (TUMS - DOSED IN MG ELEMENTAL CALCIUM) 500 MG chewable tablet Chew 1 tablet (200 mg of elemental calcium total) by mouth 3 (three) times daily with meals. 30 tablet 3  . Cholecalciferol (VITAMIN D) 2000 UNITS tablet Take 2,000 Units by mouth 2 (two) times daily.    . clopidogrel (PLAVIX) 75 MG tablet Take 1 tablet (75 mg total) by mouth daily. NEEDS APPOINTMENT FOR FUTURE REFILLS 30 tablet 0  . dexamethasone (DECADRON) 2 MG tablet Take 1 tablet (2 mg total) by mouth daily with breakfast. 10 tablet 0  . fentaNYL (DURAGESIC) 12 MCG/HR Place 1 patch onto the skin every 3 (three) days. 10 patch 0  . gabapentin (NEURONTIN) 300 MG capsule Take 1 capsule (300 mg total) by mouth 3 (three) times daily. 90 capsule 1  . Hypromellose (ARTIFICIAL TEARS OP) Apply 1 drop to eye at bedtime.    . methocarbamol (ROBAXIN) 500 MG tablet Take 500 mg by mouth 4 (four) times daily.    . metoprolol tartrate (LOPRESSOR) 25 MG tablet Take 1 tablet (25 mg total) by mouth 2 (two) times daily. NEED OV. (BETA BLOCKER) 90 tablet 0  . morphine (MSIR) 15 MG tablet Take 1 tablet (15 mg total) by mouth every 6 (six) hours as needed for moderate pain or severe pain. 90 tablet 0  . ondansetron (ZOFRAN) 8 MG tablet Take 1 tablet (8 mg total) by mouth every 8 (eight) hours as needed for nausea or vomiting. 30 tablet 1  . pantoprazole (PROTONIX) 40 MG tablet Take 1 tablet (40 mg total) by mouth daily. NEEDS APPOINTMENT FOR FUTURE REFILLS 30 tablet 0  . prochlorperazine (COMPAZINE) 10 MG tablet  Take 1 tablet (10 mg total) by mouth every 6 (six) hours as needed for nausea or vomiting. 40 tablet 0   No current facility-administered  medications for this visit.     REVIEW OF SYSTEMS:    A 10+ POINT REVIEW OF SYSTEMS WAS OBTAINED including neurology, dermatology, psychiatry, cardiac, respiratory, lymph, extremities, GI, GU, Musculoskeletal, constitutional, breasts, reproductive, HEENT.  All pertinent positives are noted in the HPI.  All others are negative.   PHYSICAL EXAMINATION: ECOG PERFORMANCE STATUS: 2 - Symptomatic, <50% confined to bed  . Vitals:   11/17/18 0847  BP: (!) 99/56  Pulse: 95  Resp: 18  Temp: 98.1 F (36.7 C)  SpO2: 90%   Filed Weights   11/17/18 0847  Weight: 134 lb 3.2 oz (60.9 kg)   .Body mass index is 18.72 kg/m.  GENERAL:alert, in no acute distress and comfortable SKIN: no acute rashes, no significant lesions EYES: conjunctiva are pink and non-injected, sclera anicteric OROPHARYNX: MMM, no exudates, no oropharyngeal erythema or ulceration NECK: supple, no JVD LYMPH:  no palpable lymphadenopathy in the cervical, axillary or inguinal regions LUNGS: clear to auscultation b/l with normal respiratory effort HEART: regular rate & rhythm ABDOMEN:  normoactive bowel sounds , non tender, not distended. No palpable hepatosplenomegaly.  Extremity: no pedal edema PSYCH: alert & oriented x 3 with fluent speech NEURO: no focal motor/sensory deficits   LABORATORY DATA:  I have reviewed the data as listed  . CBC Latest Ref Rng & Units 11/17/2018 10/22/2018 10/20/2018  WBC 4.0 - 10.5 K/uL 14.7(H) 7.9 7.3  Hemoglobin 13.0 - 17.0 g/dL 11.8(L) 12.9(L) 12.3(L)  Hematocrit 39.0 - 52.0 % 37.1(L) 40.6 38.4(L)  Platelets 150 - 400 K/uL 211 203 183    . CMP Latest Ref Rng & Units 11/17/2018 10/22/2018 10/21/2018  Glucose 70 - 99 mg/dL 104(H) 86 83  BUN 8 - 23 mg/dL 21 27(H) 26(H)  Creatinine 0.61 - 1.24 mg/dL 1.12 1.02 1.01  Sodium 135 - 145 mmol/L 140 136 136  Potassium 3.5 - 5.1 mmol/L 4.9 4.6 5.1  Chloride 98 - 111 mmol/L 100 98 101  CO2 22 - 32 mmol/L _0 Calcium 8.9 - 10.3 mg/dL 10.3  10.4(H) 10.1  Total Protein 6.5 - 8.1 g/dL 7.0 - -  Total Bilirubin 0.3 - 1.2 mg/dL 0.8 - -  Alkaline Phos 38 - 126 U/L 86 - -  AST 15 - 41 U/L 16 - -  ALT 0 - 44 U/L 12 - -        RADIOGRAPHIC STUDIES: I have personally reviewed the radiological images as listed and agreed with the findings in the report. Mr Jeri Cos Wo Contrast  Result Date: 11/06/2018 CLINICAL DATA:  73 y/o M; Lung cancer, non-small cell, staging staging lung cancer. EXAM: MRI HEAD WITHOUT AND WITH CONTRAST TECHNIQUE: Multiplanar, multiecho pulse sequences of the brain and surrounding structures were obtained without and with intravenous contrast. CONTRAST:  7.5 cc Gadavist. COMPARISON:  None. FINDINGS: Brain: No acute infarction, hemorrhage, hydrocephalus, extra-axial collection or mass lesion. Punctate nonspecific T2 FLAIR hyperintensities in subcortical and periventricular white matter well as the pons are compatible with mild chronic microvascular ischemic changes. Mild volume loss of the brain. After administration of intravenous contrast there is no abnormal enhancement of the brain. Vascular: Normal flow voids. Skull and upper cervical spine: Normal marrow signal. Sinuses/Orbits: Partial opacification of the left mastoid air cells. Mild mucosal thickening of the maxillary sinuses. No abnormal signal of right mastoid air  cells. Right intra-ocular lens replacement. Other: Left parotid gland resection postsurgical changes, partially visualized. IMPRESSION: 1. No intracranial metastatic disease identified. 2. Mild chronic microvascular ischemic changes and volume loss of the brain. Electronically Signed   By: Kristine Garbe M.D.   On: 11/06/2018 19:38   Nm Pet Image Initial (pi) Skull Base To Thigh  Result Date: 11/10/2018 CLINICAL DATA:  Initial treatment strategy for staging of non-small-cell lung cancer. EXAM: NUCLEAR MEDICINE PET SKULL BASE TO THIGH TECHNIQUE: 7.8 mCi F-18 FDG was injected intravenously.  Full-ring PET imaging was performed from the skull base to thigh after the radiotracer. CT data was obtained and used for attenuation correction and anatomic localization. Fasting blood glucose: 113 mg/dl COMPARISON:  Chest CT 10/14/2018.  Thoracic spine MRI of 10/15/2018. FINDINGS: Mediastinal blood pool activity: SUV max 1.8 NECK: Primarily lower cervical hypermetabolism is favored to be due to a combination of muscular activity and hypermetabolic "brown" fat. Given this mild limitation, no cervical hypermetabolism identified. Incidental CT findings: Surgical changes of left neck dissection. Left carotid atherosclerosis. No cervical adenopathy. CHEST: Partially cavitary posterior left lung (centered at the apex of the superior segment left lower lobe) mass with extension into the canal. This has been detailed on prior CT and MRI. Measures on the order of 6.5 x 4.8 cm and a S.U.V. max of 54.2 on image 65/4. Direct extension versus nodal metastasis within the left perihilar region. Example at 2.6 cm and a S.U.V. max of 33.2 on image 75/4. Incidental CT findings: Aortic and advanced coronary artery calcification. Moderate bullous type emphysema. ABDOMEN/PELVIS: No abdominopelvic parenchymal or nodal hypermetabolism. Incidental CT findings: Normal adrenal glands. Upper pole right renal collecting system calculus. 8.9 cm upper pole left renal minimally complex cyst. Abdominal aortic atherosclerosis. Colonic stool burden suggests constipation. SKELETON: Activity about the lateral left hip suggests trochanteric bursitis. Direct tumor involvement into the T5-6 vertebral body. Osseous involvement of medial fifth and sixth left ribs. Incidental CT findings: Probable bone islands within the pelvis. IMPRESSION: 1. Locally advanced left lung mass, as detailed previously. 2. Left perihilar direct extension versus nodal metastasis. 3. No findings of hypermetabolic distant metastasis. 4. No findings of extrathoracic  hypermetabolic metastasis. Electronically Signed   By: Abigail Miyamoto M.D.   On: 11/10/2018 16:56   Ct Biopsy  Result Date: 10/19/2018 INDICATION: 73 year old with a left lung mass with chest wall and bone invasion. Tissue diagnosis is needed. EXAM: CT-GUIDED CORE BIOPSY OF LEFT LUNG/CHEST MASS MEDICATIONS: None. ANESTHESIA/SEDATION: Moderate (conscious) sedation was employed during this procedure. A total of Versed 1.5 mg and Fentanyl 25 mcg was administered intravenously. Moderate Sedation Time: 17 minutes. The patient's level of consciousness and vital signs were monitored continuously by radiology nursing throughout the procedure under my direct supervision. FLUOROSCOPY TIME:  None COMPLICATIONS: None immediate. PROCEDURE: Informed written consent was obtained from the patient after a thorough discussion of the procedural risks, benefits and alternatives. All questions were addressed. A timeout was performed prior to the initiation of the procedure. Patient was placed prone on the CT scanner. Images through the chest were obtained. The mass involving the left posterior chest wall was targeted. Left side of the back was prepped with chlorhexidine and sterile field was created. Maximal barrier sterile technique was utilized including caps, mask, sterile gowns, sterile gloves, sterile drape, hand hygiene and skin antiseptic. Skin and soft tissues were anesthetized with 1% lidocaine. 17 gauge coaxial needle was directed into the posterior chest wall mass with CT guidance. Needle position was  confirmed within the lesion. Three core biopsies were obtained with an 18 gauge core device. Specimens placed in formalin. Needle was removed without complication. Bandage placed over the puncture site. FINDINGS: Again noted is a mass involving the posterior left lung with invasion into the posterior chest wall and adjacent bones. Needle position confirmed within the soft tissue mass. Adequate core biopsies obtained.  IMPRESSION: CT-guided core biopsies of the left posterior lung/chest wall mass. Electronically Signed   By: Markus Daft M.D.   On: 10/19/2018 13:18   Dg Chest Port 1 View  Result Date: 10/19/2018 CLINICAL DATA:  Biopsy of a LEFT UPPER LOBE lung mass earlier today. EXAM: PORTABLE CHEST 1 VIEW COMPARISON:  Chest x-rays 10/14/2018 and earlier. CT chest 10/14/2018 and earlier. FINDINGS: No evidence of pneumothorax after biopsy of the LEFT UPPER LOBE lung mass. The mass in the posteromedial LEFT UPPER LOBE presents as a focal opacity on the chest x-ray. Severe emphysematous changes in both lungs as noted previously. No new pulmonary parenchymal abnormalities. IMPRESSION: No evidence of pneumothorax after biopsy of the LEFT UPPER LOBE lung mass. Electronically Signed   By: Evangeline Dakin M.D.   On: 10/19/2018 11:38    ASSESSMENT & PLAN:   73 y.o. male with  1. 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 10/19/18 Lung biopsy revealed Squamous Cell carcinoma S/p Palliative RT to LUL lung mass with 30Gy in 10 fractions between 10/22/18 and 11/03/18, due to impending cord compression  11/06/18 MRI Brain revealed No intracranial metastatic disease identified. 2. Mild chronic microvascular ischemic changes and volume loss of the brain.  2. 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 Smoker 4. 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  Has regularly followed up with ENT Dr. Fenton Malling  PLAN -Discussed pt [place] today, 11/17/18; [place] at 12.8k, [name] -Discussed the 11/10/18 PET/CT which revealed Locally advanced left lung mass, as detailed previously. 2. Left perihilar direct  extension versus nodal metastasis. 3. No findings of hypermetabolic distant metastasis. 4. No findings of extrathoracic hypermetabolic metastasis. -Discussed the PDL-1 status results from the 10/19/18 pathology which found 30% PDL-1 tumor proportion -Discussed that the pt will be able to benefit from immunotherapy treatment, but the proportion of PDL-1 is not high enough to only use immunotherapy -Discussed that the patient's disease is ultimately not curable, and the goal of treatment would be palliative and to control the disease as much as reasonably possible -Also discussed the option to focus purely on comfort measures -Discussed the NCCN treatment guidelines with the pt and his wife, including recommendation of Carboplatin and Taxol once every 3 weeks, up to 4-6 cycles, combined with immunotherapy. Followed by maintenance immunotherapy. The pt prefers this, and to start conservatively from dose standpoint. -Discussed port placement with the pt, which he prefers. -Will begin Fentanyl patch to smoothen pain control, and will now stop MS Contin. Continue MS IR 1-2 pills every 4-6 hours as needed. -Will begin low dose, one week course of Dexamethasone for appetite stimulation -Continue Senna S two capsules at night, and Miralax once a day, back off if diarrhea develops -Pt has anti-nausea medications -Will begin Xgeva every 4 weeks to  reduce risk of fracture -Continue 2000 units Vitamin D BID -Palliative care following for pain management and goals of care discussions. -Recommend warm mist humidifier -Continue follow up with our nutritional therapist Ernestene Kiel and discussed the importance of maintaining food consumption -Recommended that the pt continue to eat well, drink at least 48-64 oz of water each day, and walk 20-30 minutes each day. -Will set the pt up for chemotherapy counseling course -Will refer the pt to IR for port placement -Will tentatively begin C1 Carboplatin, Taxol, and  Pembrolizumab in one week -Will see the pt back in 2 weeks for toxicity check   Chemo-counseling for Carbo/taxol/Pembrolizumab in 2-3 days Carbo/taxol/Pembrolizumab to start in 1 week with labs IR for port placement in 3-4 days RTC with Dr [name] with labs 1 week after 1st treatment for toxicity check Continue Xgeva q4 weeks   All of the patients questions were answered with apparent satisfaction. The patient knows to call the clinic with any problems, questions or concerns.  The total time spent in the appt was 50 minutes and more than 50% was on counseling and direct patient cares.    Sullivan Lone MD MS AAHIVMS [facility] Hematology/Oncology Physician [facility]  (Office):       [phone] (Work cell):  [phone] (Fax):           [phone]  11/17/2018 10:05 AM  I, Baldwin [place], am acting as a scribe for Dr. Sullivan Lone.   .I have reviewed the above documentation for accuracy and completeness, and I agree with the above. Brunetta Genera MD

## 2018-11-17 ENCOUNTER — Other Ambulatory Visit: Payer: Self-pay

## 2018-11-17 ENCOUNTER — Encounter: Payer: Self-pay | Admitting: Hematology

## 2018-11-17 ENCOUNTER — Telehealth: Payer: Self-pay | Admitting: Hematology

## 2018-11-17 ENCOUNTER — Inpatient Hospital Stay: Payer: Medicare Other

## 2018-11-17 ENCOUNTER — Inpatient Hospital Stay (HOSPITAL_BASED_OUTPATIENT_CLINIC_OR_DEPARTMENT_OTHER): Payer: Medicare Other | Admitting: Hematology

## 2018-11-17 VITALS — BP 99/56 | HR 95 | Temp 98.1°F | Resp 18 | Ht 71.0 in | Wt 134.2 lb

## 2018-11-17 DIAGNOSIS — C7951 Secondary malignant neoplasm of bone: Secondary | ICD-10-CM | POA: Diagnosis not present

## 2018-11-17 DIAGNOSIS — C3412 Malignant neoplasm of upper lobe, left bronchus or lung: Secondary | ICD-10-CM

## 2018-11-17 DIAGNOSIS — R042 Hemoptysis: Secondary | ICD-10-CM

## 2018-11-17 DIAGNOSIS — R634 Abnormal weight loss: Secondary | ICD-10-CM | POA: Diagnosis not present

## 2018-11-17 DIAGNOSIS — Z720 Tobacco use: Secondary | ICD-10-CM

## 2018-11-17 DIAGNOSIS — R0602 Shortness of breath: Secondary | ICD-10-CM | POA: Diagnosis not present

## 2018-11-17 DIAGNOSIS — G893 Neoplasm related pain (acute) (chronic): Secondary | ICD-10-CM

## 2018-11-17 DIAGNOSIS — K5900 Constipation, unspecified: Secondary | ICD-10-CM | POA: Diagnosis not present

## 2018-11-17 DIAGNOSIS — M545 Low back pain: Secondary | ICD-10-CM | POA: Diagnosis not present

## 2018-11-17 DIAGNOSIS — R5383 Other fatigue: Secondary | ICD-10-CM

## 2018-11-17 DIAGNOSIS — R05 Cough: Secondary | ICD-10-CM

## 2018-11-17 LAB — CMP (CANCER CENTER ONLY)
ALT: 12 U/L (ref 0–44)
AST: 16 U/L (ref 15–41)
Albumin: 2.9 g/dL — ABNORMAL LOW (ref 3.5–5.0)
Alkaline Phosphatase: 86 U/L (ref 38–126)
Anion gap: 9 (ref 5–15)
BUN: 21 mg/dL (ref 8–23)
CALCIUM: 10.3 mg/dL (ref 8.9–10.3)
CO2: 31 mmol/L (ref 22–32)
Chloride: 100 mmol/L (ref 98–111)
Creatinine: 1.12 mg/dL (ref 0.61–1.24)
GFR, Estimated: >60 mL/min (ref >60)
Glucose, Bld: 104 mg/dL — ABNORMAL HIGH (ref 70–99)
Potassium: 4.9 mmol/L (ref 3.5–5.1)
Sodium: 140 mmol/L (ref 135–145)
Total Bilirubin: 0.8 mg/dL (ref 0.3–1.2)
Total Protein: 7 g/dL (ref 6.5–8.1)

## 2018-11-17 LAB — CBC WITH DIFFERENTIAL/PLATELET
Abs Immature Granulocytes: 0.08 10*3/uL — ABNORMAL HIGH (ref 0.00–0.07)
Basophils Absolute: 0 10*3/uL (ref 0.0–0.1)
Basophils Relative: 0 %
EOS ABS: 0.2 10*3/uL (ref 0.0–0.5)
Eosinophils Relative: 2 %
HCT: 37.1 % — ABNORMAL LOW (ref 39.0–52.0)
Hemoglobin: 11.8 g/dL — ABNORMAL LOW (ref 13.0–17.0)
Immature Granulocytes: 1 %
Lymphocytes Relative: 3 %
Lymphs Abs: 0.4 10*3/uL — ABNORMAL LOW (ref 0.7–4.0)
MCH: 31 pg (ref 26.0–34.0)
MCHC: 31.8 g/dL (ref 30.0–36.0)
MCV: 97.4 fL (ref 80.0–100.0)
Monocytes Absolute: 1.2 10*3/uL — ABNORMAL HIGH (ref 0.1–1.0)
Monocytes Relative: 8 %
Neutro Abs: 12.8 10*3/uL — ABNORMAL HIGH (ref 1.7–7.7)
Neutrophils Relative %: 86 %
PLATELETS: 211 10*3/uL (ref 150–400)
RBC: 3.81 MIL/uL — ABNORMAL LOW (ref 4.22–5.81)
RDW: 13.1 % (ref 11.5–15.5)
WBC: 14.7 10*3/uL — ABNORMAL HIGH (ref 4.0–10.5)
nRBC: 0 % (ref 0.0–0.2)

## 2018-11-17 LAB — PHOSPHORUS: Phosphorus: 4.4 mg/dL (ref 2.5–4.6)

## 2018-11-17 MED ORDER — DEXAMETHASONE 2 MG PO TABS: 2.0000 mg | ORAL_TABLET | Freq: Every day | ORAL | 0 refills | Status: DC

## 2018-11-17 MED ORDER — DENOSUMAB 120 MG/1.7ML ~~LOC~~ SOLN
SUBCUTANEOUS | Status: AC
  Filled 2018-11-17: qty 1.7

## 2018-11-17 MED ORDER — DENOSUMAB 120 MG/1.7ML ~~LOC~~ SOLN
120.0000 mg | Freq: Once | SUBCUTANEOUS | Status: AC
  Administered 2018-11-17: 120 mg via SUBCUTANEOUS

## 2018-11-17 MED ORDER — MORPHINE SULFATE 15 MG PO TABS: 15.0000 mg | ORAL_TABLET | Freq: Four times a day (QID) | ORAL | 0 refills | Status: DC | PRN

## 2018-11-17 MED ORDER — FENTANYL 12 MCG/HR TD PT72: 1.0000 | MEDICATED_PATCH | TRANSDERMAL | 0 refills | Status: DC

## 2018-11-17 NOTE — Patient Instructions (Signed)

## 2018-11-17 NOTE — Telephone Encounter (Signed)
Scheduled appt per 3/11 los. ° °Printed calendar and avs. °

## 2018-11-17 NOTE — Patient Instructions (Signed)
Thank you for choosing [facility] to provide your oncology and hematology care.  To afford each patient quality time with our providers, please arrive 30 minutes before your scheduled appointment time.  If you arrive late for your appointment, you may be asked to reschedule.  We strive to give you quality time with our providers, and arriving late affects you and other patients whose appointments are after yours.    If you are a no show for multiple scheduled visits, you may be dismissed from the clinic at the providers discretion.     Again, thank you for choosing [facility], our hope is that these requests will decrease the amount of time that you wait before being seen by our physicians.  ______________________________________________________________________   Should you have questions after your visit to the [facility], please contact our office at [phone] between the hours of 8:30 and 4:30 p.m.    Voicemails left after 4:30p.m will not be returned until the following business day.     For prescription refill requests, please have your pharmacy contact us directly.  Please also try to allow 48 hours for prescription requests.     Please contact the scheduling department for questions regarding scheduling.  For scheduling of procedures such as PET scans, CT scans, MRI, Ultrasound, etc please contact central scheduling at [phone].     Resources For Cancer Patients and Caregivers:    Oncolink.org:  A wonderful resource for patients and healthcare providers for information regarding your disease, ways to tract your treatment, what to expect, etc.      American Cancer Society:  800-227-2345  Can help patients locate various types of support and financial assistance   Cancer Care: 1-800-813-HOPE (4673) Provides financial assistance, online support groups, medication/co-pay assistance.     [place] DSS:  336-641-3447 Where to apply  for food stamps, Medicaid, and utility assistance   Medicare Rights Center: 800-333-4114 Helps people with Medicare understand their rights and benefits, navigate the Medicare system, and secure the quality healthcare they deserve   SCAT: 336-333-6589 [place] [employer]'s shared-ride transportation service for eligible riders who have a disability that prevents them from riding the fixed route bus.     For additional information on assistance programs please contact our social worker:   Kyle Foster:  [phone]  

## 2018-11-18 ENCOUNTER — Telehealth: Payer: Self-pay | Admitting: *Deleted

## 2018-11-18 LAB — VITAMIN D 25 HYDROXY (VIT D DEFICIENCY, FRACTURES): Vit D, 25-Hydroxy: 88.6 ng/mL (ref 30.0–100.0)

## 2018-11-18 NOTE — Telephone Encounter (Signed)
[facility] faxed Prior authorization request for Fentanyl 12 MCG.  Request to [employer]aged [employer] letter tray receptacle of Prior Authorization forms for review.

## 2018-11-19 ENCOUNTER — Other Ambulatory Visit: Payer: Self-pay

## 2018-11-19 ENCOUNTER — Inpatient Hospital Stay: Payer: Medicare Other

## 2018-11-19 ENCOUNTER — Telehealth: Payer: Self-pay | Admitting: *Deleted

## 2018-11-19 NOTE — Telephone Encounter (Signed)
Dr. [name] completed/signed [place] [employer] Handicap Placard for patient. Form given to patient/spouse. Appointment arranged/confirmed for port placement at [place]. Confirmed with Anderson Malta. Patient appt 1pm Tuesday 3/17. NPO 6 hours prior to arrival. Patient to hold Plavix until after procedure. Information/directions given to patient/spouse. Patient/spouse verbalized understanding.

## 2018-11-22 ENCOUNTER — Other Ambulatory Visit: Payer: Self-pay | Admitting: Radiology

## 2018-11-22 ENCOUNTER — Other Ambulatory Visit: Payer: Self-pay | Admitting: Physician Assistant

## 2018-11-22 ENCOUNTER — Telehealth: Payer: Self-pay | Admitting: *Deleted

## 2018-11-22 ENCOUNTER — Other Ambulatory Visit: Payer: Self-pay | Admitting: *Deleted

## 2018-11-22 MED ORDER — PROCHLORPERAZINE MALEATE 10 MG PO TABS: 10.0000 mg | ORAL_TABLET | Freq: Four times a day (QID) | ORAL | 0 refills | Status: DC | PRN

## 2018-11-22 MED ORDER — LIDOCAINE-PRILOCAINE 2.5-2.5 % EX CREA: 1.0000 "application " | TOPICAL_CREAM | CUTANEOUS | 1 refills | Status: DC | PRN

## 2018-11-22 NOTE — Telephone Encounter (Signed)
Patient/wife called. Pain medicine not helping. Using patch and MSIR 15mg  q 6 as directed. Groggy, but still in pain. Sitting up to sleep. Per Dr.Kale, can take MSIR 15mg  q4 for breakthrough pain. Contact office if pain does not decrease. Patient/wife verbalized understanding.

## 2018-11-23 ENCOUNTER — Other Ambulatory Visit: Payer: Self-pay

## 2018-11-23 ENCOUNTER — Encounter: Payer: Self-pay | Admitting: Hematology

## 2018-11-23 ENCOUNTER — Ambulatory Visit (HOSPITAL_COMMUNITY)
Admission: RE | Admit: 2018-11-23 | Discharge: 2018-11-23 | Disposition: A | Discharge status: Home / Self Care | Payer: No Typology Code available for payment source | Source: Ambulatory Visit | Attending: Hematology | Admitting: Hematology

## 2018-11-23 ENCOUNTER — Encounter (HOSPITAL_COMMUNITY): Payer: Self-pay

## 2018-11-23 ENCOUNTER — Other Ambulatory Visit: Payer: Self-pay | Admitting: *Deleted

## 2018-11-23 DIAGNOSIS — C7951 Secondary malignant neoplasm of bone: Secondary | ICD-10-CM | POA: Insufficient documentation

## 2018-11-23 DIAGNOSIS — E785 Hyperlipidemia, unspecified: Secondary | ICD-10-CM | POA: Diagnosis not present

## 2018-11-23 DIAGNOSIS — Z79899 Other long term (current) drug therapy: Secondary | ICD-10-CM | POA: Diagnosis not present

## 2018-11-23 DIAGNOSIS — F17210 Nicotine dependence, cigarettes, uncomplicated: Secondary | ICD-10-CM | POA: Insufficient documentation

## 2018-11-23 DIAGNOSIS — Z885 Allergy status to narcotic agent status: Secondary | ICD-10-CM | POA: Diagnosis not present

## 2018-11-23 DIAGNOSIS — I10 Essential (primary) hypertension: Secondary | ICD-10-CM | POA: Insufficient documentation

## 2018-11-23 DIAGNOSIS — C3412 Malignant neoplasm of upper lobe, left bronchus or lung: Secondary | ICD-10-CM | POA: Diagnosis present

## 2018-11-23 DIAGNOSIS — Z7902 Long term (current) use of antithrombotics/antiplatelets: Secondary | ICD-10-CM | POA: Insufficient documentation

## 2018-11-23 DIAGNOSIS — Z823 Family history of stroke: Secondary | ICD-10-CM | POA: Diagnosis not present

## 2018-11-23 DIAGNOSIS — Z808 Family history of malignant neoplasm of other organs or systems: Secondary | ICD-10-CM | POA: Diagnosis not present

## 2018-11-23 DIAGNOSIS — Z8249 Family history of ischemic heart disease and other diseases of the circulatory system: Secondary | ICD-10-CM | POA: Diagnosis not present

## 2018-11-23 DIAGNOSIS — I252 Old myocardial infarction: Secondary | ICD-10-CM | POA: Diagnosis not present

## 2018-11-23 DIAGNOSIS — I2510 Atherosclerotic heart disease of native coronary artery without angina pectoris: Secondary | ICD-10-CM | POA: Insufficient documentation

## 2018-11-23 DIAGNOSIS — Z809 Family history of malignant neoplasm, unspecified: Secondary | ICD-10-CM | POA: Insufficient documentation

## 2018-11-23 DIAGNOSIS — Z955 Presence of coronary angioplasty implant and graft: Secondary | ICD-10-CM | POA: Diagnosis not present

## 2018-11-23 HISTORY — PX: IR IMAGING GUIDED PORT INSERTION: IMG5740

## 2018-11-23 LAB — CBC
HCT: 36.2 % — ABNORMAL LOW (ref 39.0–52.0)
Hemoglobin: 11.6 g/dL — ABNORMAL LOW (ref 13.0–17.0)
MCH: 31.5 pg (ref 26.0–34.0)
MCHC: 32 g/dL (ref 30.0–36.0)
MCV: 98.4 fL (ref 80.0–100.0)
NRBC: 0 % (ref 0.0–0.2)
Platelets: 242 10*3/uL (ref 150–400)
RBC: 3.68 MIL/uL — ABNORMAL LOW (ref 4.22–5.81)
RDW: 13.7 % (ref 11.5–15.5)
WBC: 15.2 10*3/uL — ABNORMAL HIGH (ref 4.0–10.5)

## 2018-11-23 LAB — PROTIME-INR
INR: 1 (ref 0.8–1.2)
PROTHROMBIN TIME: 13.5 s (ref 11.4–15.2)

## 2018-11-23 MED ORDER — SODIUM CHLORIDE 0.9 % IV SOLN: INTRAVENOUS | Status: DC

## 2018-11-23 MED ORDER — FENTANYL CITRATE (PF) 100 MCG/2ML IJ SOLN
INTRAMUSCULAR | Status: AC | PRN
  Administered 2018-11-23: 50 ug via INTRAVENOUS

## 2018-11-23 MED ORDER — HEPARIN SOD (PORK) LOCK FLUSH 100 UNIT/ML IV SOLN
INTRAVENOUS | Status: AC
  Administered 2018-11-23: 500 [IU]
  Filled 2018-11-23: qty 5

## 2018-11-23 MED ORDER — LIDOCAINE-EPINEPHRINE 2 %-1:100000 IJ SOLN
INTRAMUSCULAR | Status: AC | PRN
  Administered 2018-11-23: 20 mL

## 2018-11-23 MED ORDER — CEFAZOLIN SODIUM-DEXTROSE 2-4 GM/100ML-% IV SOLN: 2.0000 g | INTRAVENOUS | Status: DC

## 2018-11-23 MED ORDER — FENTANYL CITRATE (PF) 100 MCG/2ML IJ SOLN
INTRAMUSCULAR | Status: AC
  Filled 2018-11-23: qty 2

## 2018-11-23 MED ORDER — CEFAZOLIN SODIUM-DEXTROSE 2-4 GM/100ML-% IV SOLN
INTRAVENOUS | Status: AC | PRN
  Administered 2018-11-23: 2 g via INTRAVENOUS

## 2018-11-23 MED ORDER — CEFAZOLIN SODIUM-DEXTROSE 2-4 GM/100ML-% IV SOLN
INTRAVENOUS | Status: AC
  Filled 2018-11-23: qty 100

## 2018-11-23 MED ORDER — MIDAZOLAM HCL 2 MG/2ML IJ SOLN
INTRAMUSCULAR | Status: AC
  Filled 2018-11-23: qty 2

## 2018-11-23 MED ORDER — LIDOCAINE-EPINEPHRINE (PF) 1 %-1:200000 IJ SOLN
INTRAMUSCULAR | Status: AC
  Filled 2018-11-23: qty 30

## 2018-11-23 MED ORDER — MIDAZOLAM HCL 2 MG/2ML IJ SOLN
INTRAMUSCULAR | Status: AC | PRN
  Administered 2018-11-23: 1 mg via INTRAVENOUS
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
## 2018-11-23 NOTE — Discharge Instructions (Addendum)
Implanted Port Insertion, Care After °This sheet gives you information about how to care for yourself after your procedure. Your health care provider may also give you more specific instructions. If you have problems or questions, contact your health care provider. °What can I expect after the procedure? °After the procedure, it is common to have: °· Discomfort at the port insertion site. °· Bruising on the skin over the port. This should improve over 3-4 days. °Follow these instructions at home: °Port care °· After your port is placed, you will get a manufacturer's information card. The card has information about your port. Keep this card with you at all times. °· Take care of the port as told by your health care provider. Ask your health care provider if you or a family member can get training for taking care of the port at home. A home health care nurse may also take care of the port. °· Make sure to remember what type of port you have. °Incision care ° °  ° °· Follow instructions from your health care provider about how to take care of your port insertion site. Make sure you: °? Wash your hands with soap and water before and after you change your bandage (dressing). If soap and water are not available, use hand sanitizer. °? Change your dressing as told by your health care provider. °? Leave stitches (sutures), skin glue, or adhesive strips in place. These skin closures may need to stay in place for 2 weeks or longer. If adhesive strip edges start to loosen and curl up, you may trim the loose edges. Do not remove adhesive strips completely unless your health care provider tells you to do that. °· Check your port insertion site every day for signs of infection. Check for: °? Redness, swelling, or pain. °? Fluid or blood. °? Warmth. °? Pus or a bad smell. °Activity °· Return to your normal activities as told by your health care provider. Ask your health care provider what activities are safe for you. °· Do not  lift anything that is heavier than 10 lb (4.5 kg), or the limit that you are told, until your health care provider says that it is safe. °General instructions °· Take over-the-counter and prescription medicines only as told by your health care provider. °· Do not take baths, swim, or use a hot tub until your health care provider approves. Ask your health care provider if you may take showers. You may only be allowed to take sponge baths. °· Do not drive for 24 hours if you were given a sedative during your procedure. °· Wear a medical alert bracelet in case of an emergency. This will tell any health care providers that you have a port. °· Keep all follow-up visits as told by your health care provider. This is important. °Contact a health care provider if: °· You cannot flush your port with saline as directed, or you cannot draw blood from the port. °· You have a fever or chills. °· You have redness, swelling, or pain around your port insertion site. °· You have fluid or blood coming from your port insertion site. °· Your port insertion site feels warm to the touch. °· You have pus or a bad smell coming from the port insertion site. °Get help right away if: °· You have chest pain or shortness of breath. °· You have bleeding from your port that you cannot control. °Summary °· Take care of the port as told by your health   care provider. Keep the manufacturer's information card with you at all times. °· Change your dressing as told by your health care provider. °· Contact a health care provider if you have a fever or chills or if you have redness, swelling, or pain around your port insertion site. °· Keep all follow-up visits as told by your health care provider. °This information is not intended to replace advice given to you by your health care provider. Make sure you discuss any questions you have with your health care provider. °Document Released: 06/15/2013 Document Revised: 03/23/2018 Document Reviewed:  03/23/2018 °Elsevier Interactive Patient Education © 2019 Elsevier Inc. ° °Moderate Conscious Sedation, Adult, Care After °These instructions provide you with information about caring for yourself after your procedure. Your health care provider may also give you more specific instructions. Your treatment has been planned according to current medical practices, but problems sometimes occur. Call your health care provider if you have any problems or questions after your procedure. °What can I expect after the procedure? °After your procedure, it is common: °· To feel sleepy for several hours. °· To feel clumsy and have poor balance for several hours. °· To have poor judgment for several hours. °· To vomit if you eat too soon. °Follow these instructions at home: °For at least 24 hours after the procedure: ° °· Do not: °? Participate in activities where you could fall or become injured. °? Drive. °? Use heavy machinery. °? Drink alcohol. °? Take sleeping pills or medicines that cause drowsiness. °? Make important decisions or sign legal documents. °? Take care of children on your own. °· Rest. °Eating and drinking °· Follow the diet recommended by your health care provider. °· If you vomit: °? Drink water, juice, or soup when you can drink without vomiting. °? Make sure you have little or no nausea before eating solid foods. °General instructions °· Have a responsible adult stay with you until you are awake and alert. °· Take over-the-counter and prescription medicines only as told by your health care provider. °· If you smoke, do not smoke without supervision. °· Keep all follow-up visits as told by your health care provider. This is important. °Contact a health care provider if: °· You keep feeling nauseous or you keep vomiting. °· You feel light-headed. °· You develop a rash. °· You have a fever. °Get help right away if: °· You have trouble breathing. °This information is not intended to replace advice given to you  by your health care provider. Make sure you discuss any questions you have with your health care provider. °Document Released: 06/15/2013 Document Revised: 01/28/2016 Document Reviewed: 12/15/2015 °Elsevier Interactive Patient Education © 2019 Elsevier Inc. ° °

## 2018-11-23 NOTE — Progress Notes (Signed)
Called pt to introduce myself as his Arboriculturist but he was unavailable so I spoke to his wife.  Pt has 3 insurances so copay assistance shouldn't be needed.  I offered the [place], went over what it covers and gave her the income requirement.  She stated they exceed the requirement so he doesn't qualify for the grant at this time.  I will give him my card on 11/24/18 for any questions or concerns he may have in the future.

## 2018-11-24 ENCOUNTER — Inpatient Hospital Stay: Payer: Medicare Other

## 2018-11-24 ENCOUNTER — Other Ambulatory Visit: Payer: Self-pay | Admitting: Hematology

## 2018-11-24 ENCOUNTER — Encounter: Payer: Self-pay | Admitting: *Deleted

## 2018-11-24 ENCOUNTER — Other Ambulatory Visit: Payer: Self-pay | Admitting: Cardiovascular Disease

## 2018-11-24 ENCOUNTER — Other Ambulatory Visit: Payer: Self-pay

## 2018-11-24 VITALS — BP 112/64 | HR 80 | Temp 98.3°F | Resp 16

## 2018-11-24 DIAGNOSIS — M545 Low back pain: Secondary | ICD-10-CM | POA: Diagnosis not present

## 2018-11-24 DIAGNOSIS — C799 Secondary malignant neoplasm of unspecified site: Secondary | ICD-10-CM

## 2018-11-24 DIAGNOSIS — Z7189 Other specified counseling: Secondary | ICD-10-CM

## 2018-11-24 DIAGNOSIS — C3412 Malignant neoplasm of upper lobe, left bronchus or lung: Secondary | ICD-10-CM | POA: Diagnosis not present

## 2018-11-24 DIAGNOSIS — C3492 Malignant neoplasm of unspecified part of left bronchus or lung: Secondary | ICD-10-CM

## 2018-11-24 DIAGNOSIS — K5900 Constipation, unspecified: Secondary | ICD-10-CM | POA: Diagnosis not present

## 2018-11-24 DIAGNOSIS — R0602 Shortness of breath: Secondary | ICD-10-CM | POA: Diagnosis not present

## 2018-11-24 DIAGNOSIS — C7951 Secondary malignant neoplasm of bone: Secondary | ICD-10-CM

## 2018-11-24 DIAGNOSIS — R634 Abnormal weight loss: Secondary | ICD-10-CM | POA: Diagnosis not present

## 2018-11-24 LAB — CMP (CANCER CENTER ONLY)
ALT: 17 U/L (ref 0–44)
AST: 15 U/L (ref 15–41)
Albumin: 2.8 g/dL — ABNORMAL LOW (ref 3.5–5.0)
Alkaline Phosphatase: 84 U/L (ref 38–126)
Anion gap: 10 (ref 5–15)
BILIRUBIN TOTAL: 0.5 mg/dL (ref 0.3–1.2)
BUN: 18 mg/dL (ref 8–23)
CO2: 24 mmol/L (ref 22–32)
Calcium: 8.9 mg/dL (ref 8.9–10.3)
Chloride: 105 mmol/L (ref 98–111)
Creatinine: 0.74 mg/dL (ref 0.61–1.24)
GFR, Est AFR Am: >60 mL/min (ref >60)
Glucose, Bld: 101 mg/dL — ABNORMAL HIGH (ref 70–99)
Potassium: 4.4 mmol/L (ref 3.5–5.1)
Sodium: 139 mmol/L (ref 135–145)
Total Protein: 6.7 g/dL (ref 6.5–8.1)

## 2018-11-24 LAB — CBC WITH DIFFERENTIAL (CANCER CENTER ONLY)
Abs Immature Granulocytes: 0.05 10*3/uL (ref 0.00–0.07)
Basophils Absolute: 0 10*3/uL (ref 0.0–0.1)
Basophils Relative: 0 %
Eosinophils Absolute: 0.1 10*3/uL (ref 0.0–0.5)
Eosinophils Relative: 1 %
HCT: 34.8 % — ABNORMAL LOW (ref 39.0–52.0)
Hemoglobin: 11.1 g/dL — ABNORMAL LOW (ref 13.0–17.0)
Immature Granulocytes: 0 %
LYMPHS ABS: 0.4 10*3/uL — AB (ref 0.7–4.0)
Lymphocytes Relative: 3 %
MCH: 31.5 pg (ref 26.0–34.0)
MCHC: 31.9 g/dL (ref 30.0–36.0)
MCV: 98.9 fL (ref 80.0–100.0)
Monocytes Absolute: 0.9 10*3/uL (ref 0.1–1.0)
Monocytes Relative: 7 %
Neutro Abs: 11.3 10*3/uL — ABNORMAL HIGH (ref 1.7–7.7)
Neutrophils Relative %: 89 %
Platelet Count: 243 10*3/uL (ref 150–400)
RBC: 3.52 MIL/uL — ABNORMAL LOW (ref 4.22–5.81)
RDW: 14.1 % (ref 11.5–15.5)
WBC Count: 12.8 10*3/uL — ABNORMAL HIGH (ref 4.0–10.5)
nRBC: 0 % (ref 0.0–0.2)

## 2018-11-24 MED ORDER — SODIUM CHLORIDE 0.9 % IV SOLN
135.0000 mg/m2 | Freq: Once | INTRAVENOUS | Status: AC
  Administered 2018-11-24: 234 mg via INTRAVENOUS
  Filled 2018-11-24: qty 39

## 2018-11-24 MED ORDER — SODIUM CHLORIDE 0.9 % IV SOLN
200.0000 mg | Freq: Once | INTRAVENOUS | Status: AC
  Administered 2018-11-24: 200 mg via INTRAVENOUS
  Filled 2018-11-24: qty 8

## 2018-11-24 MED ORDER — PALONOSETRON HCL INJECTION 0.25 MG/5ML
INTRAVENOUS | Status: AC
  Filled 2018-11-24: qty 5

## 2018-11-24 MED ORDER — FAMOTIDINE IN NACL 20-0.9 MG/50ML-% IV SOLN: 20.0000 mg | Freq: Once | INTRAVENOUS | Status: DC

## 2018-11-24 MED ORDER — FAMOTIDINE 20 MG IN NS 100 ML IVPB
20.0000 mg | Freq: Once | INTRAVENOUS | Status: AC
  Administered 2018-11-24: 20 mg via INTRAVENOUS
  Filled 2018-11-24: qty 100

## 2018-11-24 MED ORDER — SODIUM CHLORIDE 0.9 % IV SOLN
412.0000 mg | Freq: Once | INTRAVENOUS | Status: AC
  Administered 2018-11-24: 410 mg via INTRAVENOUS
  Filled 2018-11-24: qty 41

## 2018-11-24 MED ORDER — DEXAMETHASONE 4 MG PO TABS: ORAL_TABLET | ORAL | 1 refills | Status: DC

## 2018-11-24 MED ORDER — LORAZEPAM 0.5 MG PO TABS: 0.5000 mg | ORAL_TABLET | Freq: Four times a day (QID) | ORAL | 0 refills | Status: DC | PRN

## 2018-11-24 MED ORDER — SODIUM CHLORIDE 0.9% FLUSH
10.0000 mL | INTRAVENOUS | Status: DC | PRN
  Administered 2018-11-24: 10 mL
  Filled 2018-11-24: qty 10

## 2018-11-24 MED ORDER — DIPHENHYDRAMINE HCL 50 MG/ML IJ SOLN
50.0000 mg | Freq: Once | INTRAMUSCULAR | Status: AC
  Administered 2018-11-24: 50 mg via INTRAVENOUS

## 2018-11-24 MED ORDER — DIPHENHYDRAMINE HCL 50 MG/ML IJ SOLN
INTRAMUSCULAR | Status: AC
  Filled 2018-11-24: qty 1

## 2018-11-24 MED ORDER — SODIUM CHLORIDE 0.9 % IV SOLN
Freq: Once | INTRAVENOUS | Status: AC
  Administered 2018-11-24: 11:00:00 via INTRAVENOUS
  Filled 2018-11-24: qty 250

## 2018-11-24 MED ORDER — PALONOSETRON HCL INJECTION 0.25 MG/5ML
0.2500 mg | Freq: Once | INTRAVENOUS | Status: AC
  Administered 2018-11-24: 0.25 mg via INTRAVENOUS

## 2018-11-24 MED ORDER — SODIUM CHLORIDE 0.9 % IV SOLN
Freq: Once | INTRAVENOUS | Status: AC
  Administered 2018-11-24: 11:00:00 via INTRAVENOUS
  Filled 2018-11-24: qty 5

## 2018-11-24 MED ORDER — HEPARIN SOD (PORK) LOCK FLUSH 100 UNIT/ML IV SOLN
500.0000 [IU] | Freq: Once | INTRAVENOUS | Status: AC | PRN
  Administered 2018-11-24: 500 [IU]
  Filled 2018-11-24: qty 5

## 2018-11-24 NOTE — Progress Notes (Signed)
START ON PATHWAY REGIMEN - Non-Small Cell Lung     A cycle is every 21 days:     Pembrolizumab      Paclitaxel      Carboplatin   **Always confirm dose/schedule in your pharmacy ordering system**  Patient Characteristics: Stage IV Metastatic, Squamous, PS = 0, 1, First Line, PD-L1 Expression Positive 1-49% (TPS) / Negative / Not Tested / Awaiting Test Results and Immunotherapy Candidate AJCC T Category: TX Current Disease Status: Distant Metastases AJCC N Category: NX AJCC M Category: M1c AJCC 8 Stage Grouping: IVB Histology: Squamous Cell Line of therapy: First Line ECOG Performance Status: 1 PD-L1 Expression Status: PD-L1 Positive 1-49% (TPS) Immunotherapy Candidate Status: Candidate for Immunotherapy Intent of Therapy: Non-Curative / Palliative Intent, Discussed with Patient 

## 2018-11-24 NOTE — Patient Instructions (Addendum)
[facility] Discharge Instructions for Patients Receiving Chemotherapy  Today you received the following chemotherapy agents Keytruda, Taxol, and Carboplatin  To help prevent nausea and vomiting after your treatment, we encourage you to take your nausea medication as directed If you develop nausea and vomiting that is not controlled by your nausea medication, call the clinic.   BELOW ARE SYMPTOMS THAT SHOULD BE REPORTED IMMEDIATELY:  *FEVER GREATER THAN 100.5 F  *CHILLS WITH OR WITHOUT FEVER  NAUSEA AND VOMITING THAT IS NOT CONTROLLED WITH YOUR NAUSEA MEDICATION  *UNUSUAL SHORTNESS OF BREATH  *UNUSUAL BRUISING OR BLEEDING  TENDERNESS IN MOUTH AND THROAT WITH OR WITHOUT PRESENCE OF ULCERS  *URINARY PROBLEMS  *BOWEL PROBLEMS  UNUSUAL RASH Items with * indicate a potential emergency and should be followed up as soon as possible.  Feel free to call the clinic should you have any questions or concerns. The clinic phone number is [phone].  Please show the [facility] at check-in to the Emergency Department and triage nurse.  Pembrolizumab Beryle Flock) injection What is this medicine? PEMBROLIZUMAB (pem broe liz ue mab) is a monoclonal antibody. It is used to treat cervical cancer, esophageal cancer, head and neck cancer, hepatocellular cancer, Hodgkin lymphoma, kidney cancer, lymphoma, melanoma, Merkel cell carcinoma, lung cancer, stomach cancer, urothelial cancer, and cancers that have a certain genetic condition. This medicine may be used for other purposes; ask your health care provider or pharmacist if you have questions. COMMON BRAND NAME(S): Keytruda What should I tell my health care provider before I take this medicine? They need to know if you have any of these conditions: -diabetes -immune system problems -inflammatory bowel disease -liver disease -lung or breathing disease -lupus -received or scheduled to receive an organ transplant or a  stem-cell transplant that uses donor stem cells -an unusual or allergic reaction to pembrolizumab, other medicines, foods, dyes, or preservatives -pregnant or trying to get pregnant -breast-feeding How should I use this medicine? This medicine is for infusion into a vein. It is given by a health care professional in a hospital or clinic setting. A special MedGuide will be given to you before each treatment. Be sure to read this information carefully each time. Talk to your pediatrician regarding the use of this medicine in children. While this drug may be prescribed for selected conditions, precautions do apply. Overdosage: If you think you have taken too much of this medicine contact a poison control center or emergency room at once. NOTE: This medicine is only for you. Do not share this medicine with others. What if I miss a dose? It is important not to miss your dose. Call your doctor or health care professional if you are unable to keep an appointment. What may interact with this medicine? Interactions have not been studied. Give your health care provider a list of all the medicines, herbs, non-prescription drugs, or dietary supplements you use. Also tell them if you smoke, drink alcohol, or use illegal drugs. Some items may interact with your medicine. This list may not describe all possible interactions. Give your health care provider a list of all the medicines, herbs, non-prescription drugs, or dietary supplements you use. Also tell them if you smoke, drink alcohol, or use illegal drugs. Some items may interact with your medicine. What should I watch for while using this medicine? Your condition will be monitored carefully while you are receiving this medicine. You may need blood work done while you are taking this medicine. Do not become  pregnant while taking this medicine or for 4 months after stopping it. Women should inform their doctor if they wish to become pregnant or think they  might be pregnant. There is a potential for serious side effects to an unborn child. Talk to your health care professional or pharmacist for more information. Do not breast-feed an infant while taking this medicine or for 4 months after the last dose. What side effects may I notice from receiving this medicine? Side effects that you should report to your doctor or health care professional as soon as possible: -allergic reactions like skin rash, itching or hives, swelling of the face, lips, or tongue -bloody or black, tarry -breathing problems -changes in vision -chest pain -chills -confusion -constipation -cough -diarrhea -dizziness or feeling faint or lightheaded -fast or irregular heartbeat -fever -flushing -hair loss -joint pain -low blood counts - this medicine may decrease the number of white blood cells, red blood cells and platelets. You may be at increased risk for infections and bleeding. -muscle pain -muscle weakness -persistent headache -redness, blistering, peeling or loosening of the skin, including inside the mouth -signs and symptoms of high blood sugar such as dizziness; dry mouth; dry skin; fruity breath; nausea; stomach pain; increased hunger or thirst; increased urination -signs and symptoms of kidney injury like trouble passing urine or change in the amount of urine -signs and symptoms of liver injury like dark urine, light-colored stools, loss of appetite, nausea, right upper belly pain, yellowing of the eyes or skin -sweating -swollen lymph nodes -weight loss Side effects that usually do not require medical attention (report to your doctor or health care professional if they continue or are bothersome): -decreased appetite -muscle pain -tiredness This list may not describe all possible side effects. Call your doctor for medical advice about side effects. You may report side effects to FDA at 1-800-FDA-1088. Where should I keep my medicine? This drug is given  in a hospital or clinic and will not be stored at home. NOTE: This sheet is a summary. It may not cover all possible information. If you have questions about this medicine, talk to your doctor, pharmacist, or health care provider.  2019 Elsevier/Gold Standard (2018-04-08 15:06:10)  Paclitaxel (Taxol) injection What is this medicine? PACLITAXEL (PAK li TAX el) is a chemotherapy drug. It targets fast dividing cells, like cancer cells, and causes these cells to die. This medicine is used to treat ovarian cancer, breast cancer, lung cancer, Kaposi's sarcoma, and other cancers. This medicine may be used for other purposes; ask your health care provider or pharmacist if you have questions. COMMON BRAND NAME(S): Onxol, Taxol What should I tell my health care provider before I take this medicine? They need to know if you have any of these conditions: -history of irregular heartbeat -liver disease -low blood counts, like low white cell, platelet, or red cell counts -lung or breathing disease, like asthma -tingling of the fingers or toes, or other nerve disorder -an unusual or allergic reaction to paclitaxel, alcohol, polyoxyethylated castor oil, other chemotherapy, other medicines, foods, dyes, or preservatives -pregnant or trying to get pregnant -breast-feeding How should I use this medicine? This drug is given as an infusion into a vein. It is administered in a hospital or clinic by a specially trained health care professional. Talk to your pediatrician regarding the use of this medicine in children. Special care may be needed. Overdosage: If you think you have taken too much of this medicine contact a poison control center or emergency  room at once. NOTE: This medicine is only for you. Do not share this medicine with others. What if I miss a dose? It is important not to miss your dose. Call your doctor or health care professional if you are unable to keep an appointment. What may interact with  this medicine? Do not take this medicine with any of the following medications: -disulfiram -metronidazole This medicine may also interact with the following medications: -antiviral medicines for hepatitis, HIV or AIDS -certain antibiotics like erythromycin and clarithromycin -certain medicines for fungal infections like ketoconazole and itraconazole -certain medicines for seizures like carbamazepine, phenobarbital, phenytoin -gemfibrozil -nefazodone -rifampin -St. John's wort This list may not describe all possible interactions. Give your health care provider a list of all the medicines, herbs, non-prescription drugs, or dietary supplements you use. Also tell them if you smoke, drink alcohol, or use illegal drugs. Some items may interact with your medicine. What should I watch for while using this medicine? Your condition will be monitored carefully while you are receiving this medicine. You will need important blood work done while you are taking this medicine. This medicine can cause serious allergic reactions. To reduce your risk you will need to take other medicine(s) before treatment with this medicine. If you experience allergic reactions like skin rash, itching or hives, swelling of the face, lips, or tongue, tell your doctor or health care professional right away. In some cases, you may be given additional medicines to help with side effects. Follow all directions for their use. This drug may make you feel generally unwell. This is not uncommon, as chemotherapy can affect healthy cells as well as cancer cells. Report any side effects. Continue your course of treatment even though you feel ill unless your doctor tells you to stop. Call your doctor or health care professional for advice if you get a fever, chills or sore throat, or other symptoms of a cold or flu. Do not treat yourself. This drug decreases your body's ability to fight infections. Try to avoid being around people who are  sick. This medicine may increase your risk to bruise or bleed. Call your doctor or health care professional if you notice any unusual bleeding. Be careful brushing and flossing your teeth or using a toothpick because you may get an infection or bleed more easily. If you have any dental work done, tell your dentist you are receiving this medicine. Avoid taking products that contain aspirin, acetaminophen, ibuprofen, naproxen, or ketoprofen unless instructed by your doctor. These medicines may hide a fever. Do not become pregnant while taking this medicine. Women should inform their doctor if they wish to become pregnant or think they might be pregnant. There is a potential for serious side effects to an unborn child. Talk to your health care professional or pharmacist for more information. Do not breast-feed an infant while taking this medicine. Men are advised not to father a child while receiving this medicine. This product may contain alcohol. Ask your pharmacist or healthcare provider if this medicine contains alcohol. Be sure to tell all healthcare providers you are taking this medicine. Certain medicines, like metronidazole and disulfiram, can cause an unpleasant reaction when taken with alcohol. The reaction includes flushing, headache, nausea, vomiting, sweating, and increased thirst. The reaction can last from 30 minutes to several hours. What side effects may I notice from receiving this medicine? Side effects that you should report to your doctor or health care professional as soon as possible: -allergic reactions like skin rash,  itching or hives, swelling of the face, lips, or tongue -breathing problems -changes in vision -fast, irregular heartbeat -high or low blood pressure -mouth sores -pain, tingling, numbness in the hands or feet -signs of decreased platelets or bleeding - bruising, pinpoint red spots on the skin, black, tarry stools, blood in the urine -signs of decreased red blood  cells - unusually weak or tired, feeling faint or lightheaded, falls -signs of infection - fever or chills, cough, sore throat, pain or difficulty passing urine -signs and symptoms of liver injury like dark yellow or brown urine; general ill feeling or flu-like symptoms; light-colored stools; loss of appetite; nausea; right upper belly pain; unusually weak or tired; yellowing of the eyes or skin -swelling of the ankles, feet, hands -unusually slow heartbeat Side effects that usually do not require medical attention (report to your doctor or health care professional if they continue or are bothersome): -diarrhea -hair loss -loss of appetite -muscle or joint pain -nausea, vomiting -pain, redness, or irritation at site where injected -tiredness This list may not describe all possible side effects. Call your doctor for medical advice about side effects. You may report side effects to FDA at 1-800-FDA-1088. Where should I keep my medicine? This drug is given in a hospital or clinic and will not be stored at home. NOTE: This sheet is a summary. It may not cover all possible information. If you have questions about this medicine, talk to your doctor, pharmacist, or health care provider.  2019 Elsevier/Gold Standard (2017-04-28 13:14:55)  Carboplatin injection What is this medicine? CARBOPLATIN (KAR boe pla tin) is a chemotherapy drug. It targets fast dividing cells, like cancer cells, and causes these cells to die. This medicine is used to treat ovarian cancer and many other cancers. This medicine may be used for other purposes; ask your health care provider or pharmacist if you have questions. COMMON BRAND NAME(S): Paraplatin What should I tell my health care provider before I take this medicine? They need to know if you have any of these conditions: -blood disorders -hearing problems -kidney disease -recent or ongoing radiation therapy -an unusual or allergic reaction to carboplatin,  cisplatin, other chemotherapy, other medicines, foods, dyes, or preservatives -pregnant or trying to get pregnant -breast-feeding How should I use this medicine? This drug is usually given as an infusion into a vein. It is administered in a hospital or clinic by a specially trained health care professional. Talk to your pediatrician regarding the use of this medicine in children. Special care may be needed. Overdosage: If you think you have taken too much of this medicine contact a poison control center or emergency room at once. NOTE: This medicine is only for you. Do not share this medicine with others. What if I miss a dose? It is important not to miss a dose. Call your doctor or health care professional if you are unable to keep an appointment. What may interact with this medicine? -medicines for seizures -medicines to increase blood counts like filgrastim, pegfilgrastim, sargramostim -some antibiotics like amikacin, gentamicin, neomycin, streptomycin, tobramycin -vaccines Talk to your doctor or health care professional before taking any of these medicines: -acetaminophen -aspirin -ibuprofen -ketoprofen -naproxen This list may not describe all possible interactions. Give your health care provider a list of all the medicines, herbs, non-prescription drugs, or dietary supplements you use. Also tell them if you smoke, drink alcohol, or use illegal drugs. Some items may interact with your medicine. What should I watch for while using this medicine?  Your condition will be monitored carefully while you are receiving this medicine. You will need important blood work done while you are taking this medicine. This drug may make you feel generally unwell. This is not uncommon, as chemotherapy can affect healthy cells as well as cancer cells. Report any side effects. Continue your course of treatment even though you feel ill unless your doctor tells you to stop. In some cases, you may be given  additional medicines to help with side effects. Follow all directions for their use. Call your doctor or health care professional for advice if you get a fever, chills or sore throat, or other symptoms of a cold or flu. Do not treat yourself. This drug decreases your body's ability to fight infections. Try to avoid being around people who are sick. This medicine may increase your risk to bruise or bleed. Call your doctor or health care professional if you notice any unusual bleeding. Be careful brushing and flossing your teeth or using a toothpick because you may get an infection or bleed more easily. If you have any dental work done, tell your dentist you are receiving this medicine. Avoid taking products that contain aspirin, acetaminophen, ibuprofen, naproxen, or ketoprofen unless instructed by your doctor. These medicines may hide a fever. Do not become pregnant while taking this medicine. Women should inform their doctor if they wish to become pregnant or think they might be pregnant. There is a potential for serious side effects to an unborn child. Talk to your health care professional or pharmacist for more information. Do not breast-feed an infant while taking this medicine. What side effects may I notice from receiving this medicine? Side effects that you should report to your doctor or health care professional as soon as possible: -allergic reactions like skin rash, itching or hives, swelling of the face, lips, or tongue -signs of infection - fever or chills, cough, sore throat, pain or difficulty passing urine -signs of decreased platelets or bleeding - bruising, pinpoint red spots on the skin, black, tarry stools, nosebleeds -signs of decreased red blood cells - unusually weak or tired, fainting spells, lightheadedness -breathing problems -changes in hearing -changes in vision -chest pain -high blood pressure -low blood counts - This drug may decrease the number of white blood cells, red  blood cells and platelets. You may be at increased risk for infections and bleeding. -nausea and vomiting -pain, swelling, redness or irritation at the injection site -pain, tingling, numbness in the hands or feet -problems with balance, talking, walking -trouble passing urine or change in the amount of urine Side effects that usually do not require medical attention (report to your doctor or health care professional if they continue or are bothersome): -hair loss -loss of appetite -metallic taste in the mouth or changes in taste This list may not describe all possible side effects. Call your doctor for medical advice about side effects. You may report side effects to FDA at 1-800-FDA-1088. Where should I keep my medicine? This drug is given in a hospital or clinic and will not be stored at home. NOTE: This sheet is a summary. It may not cover all possible information. If you have questions about this medicine, talk to your doctor, pharmacist, or health care provider.  2019 Elsevier/Gold Standard (2007-11-30 14:38:05)   Pegfilgrastim (Udenyca) injection What is this medicine? PEGFILGRASTIM (PEG fil gra stim) is a long-acting granulocyte colony-stimulating factor that stimulates the growth of neutrophils, a type of white blood cell important in the body's  fight against infection. It is used to reduce the incidence of fever and infection in patients with certain types of cancer who are receiving chemotherapy that affects the bone marrow, and to increase survival after being exposed to high doses of radiation. This medicine may be used for other purposes; ask your health care provider or pharmacist if you have questions. COMMON BRAND NAME(S): Domenic Moras, UDENYCA What should I tell my health care provider before I take this medicine? They need to know if you have any of these conditions: -kidney disease -latex allergy -ongoing radiation therapy -sickle cell disease -skin reactions to  acrylic adhesives (On-Body Injector only) -an unusual or allergic reaction to pegfilgrastim, filgrastim, other medicines, foods, dyes, or preservatives -pregnant or trying to get pregnant -breast-feeding How should I use this medicine? This medicine is for injection under the skin. If you get this medicine at home, you will be taught how to prepare and give the pre-filled syringe or how to use the On-body Injector. Refer to the patient Instructions for Use for detailed instructions. Use exactly as directed. Tell your healthcare provider immediately if you suspect that the On-body Injector may not have performed as intended or if you suspect the use of the On-body Injector resulted in a missed or partial dose. It is important that you put your used needles and syringes in a special sharps container. Do not put them in a trash can. If you do not have a sharps container, call your pharmacist or healthcare provider to get one. Talk to your pediatrician regarding the use of this medicine in children. While this drug may be prescribed for selected conditions, precautions do apply. Overdosage: If you think you have taken too much of this medicine contact a poison control center or emergency room at once. NOTE: This medicine is only for you. Do not share this medicine with others. What if I miss a dose? It is important not to miss your dose. Call your doctor or health care professional if you miss your dose. If you miss a dose due to an On-body Injector failure or leakage, a new dose should be administered as soon as possible using a single prefilled syringe for manual use. What may interact with this medicine? Interactions have not been studied. Give your health care provider a list of all the medicines, herbs, non-prescription drugs, or dietary supplements you use. Also tell them if you smoke, drink alcohol, or use illegal drugs. Some items may interact with your medicine. This list may not describe all  possible interactions. Give your health care provider a list of all the medicines, herbs, non-prescription drugs, or dietary supplements you use. Also tell them if you smoke, drink alcohol, or use illegal drugs. Some items may interact with your medicine. What should I watch for while using this medicine? You may need blood work done while you are taking this medicine. If you are going to need a MRI, CT scan, or other procedure, tell your doctor that you are using this medicine (On-Body Injector only). What side effects may I notice from receiving this medicine? Side effects that you should report to your doctor or health care professional as soon as possible: -allergic reactions like skin rash, itching or hives, swelling of the face, lips, or tongue -back pain -dizziness -fever -pain, redness, or irritation at site where injected -pinpoint red spots on the skin -red or dark-brown urine -shortness of breath or breathing problems -stomach or side pain, or pain at the shoulder -swelling -  tiredness -trouble passing urine or change in the amount of urine Side effects that usually do not require medical attention (report to your doctor or health care professional if they continue or are bothersome): -bone pain -muscle pain This list may not describe all possible side effects. Call your doctor for medical advice about side effects. You may report side effects to FDA at 1-800-FDA-1088. Where should I keep my medicine? Keep out of the reach of children. If you are using this medicine at home, you will be instructed on how to store it. Throw away any unused medicine after the expiration date on the label. NOTE: This sheet is a summary. It may not cover all possible information. If you have questions about this medicine, talk to your doctor, pharmacist, or health care provider.  2019 Elsevier/Gold Standard (2017-11-30 16:57:08)

## 2018-11-25 ENCOUNTER — Telehealth: Payer: Self-pay | Admitting: *Deleted

## 2018-11-25 NOTE — Telephone Encounter (Signed)
Called to f/u on first day chemo 3/18. Patient out of house at time of call. Per wife, feet slightly swollen this morning but once he got up and moved about, swelling decreased. No reports of nausea at this time. Reviewed medications. Wife verbalized understanding and states will contact office for questions or concerns.

## 2018-11-26 ENCOUNTER — Other Ambulatory Visit: Payer: Self-pay

## 2018-11-26 ENCOUNTER — Inpatient Hospital Stay: Payer: Medicare Other

## 2018-11-26 VITALS — BP 107/60 | HR 86 | Resp 18

## 2018-11-26 DIAGNOSIS — C7951 Secondary malignant neoplasm of bone: Secondary | ICD-10-CM | POA: Diagnosis not present

## 2018-11-26 DIAGNOSIS — C3492 Malignant neoplasm of unspecified part of left bronchus or lung: Secondary | ICD-10-CM

## 2018-11-26 DIAGNOSIS — R634 Abnormal weight loss: Secondary | ICD-10-CM | POA: Diagnosis not present

## 2018-11-26 DIAGNOSIS — K5900 Constipation, unspecified: Secondary | ICD-10-CM | POA: Diagnosis not present

## 2018-11-26 DIAGNOSIS — M545 Low back pain: Secondary | ICD-10-CM | POA: Diagnosis not present

## 2018-11-26 DIAGNOSIS — Z7189 Other specified counseling: Secondary | ICD-10-CM

## 2018-11-26 DIAGNOSIS — C799 Secondary malignant neoplasm of unspecified site: Secondary | ICD-10-CM

## 2018-11-26 DIAGNOSIS — C3412 Malignant neoplasm of upper lobe, left bronchus or lung: Secondary | ICD-10-CM | POA: Diagnosis not present

## 2018-11-26 DIAGNOSIS — R0602 Shortness of breath: Secondary | ICD-10-CM | POA: Diagnosis not present

## 2018-11-26 MED ORDER — PEGFILGRASTIM-CBQV 6 MG/0.6ML ~~LOC~~ SOSY
6.0000 mg | PREFILLED_SYRINGE | Freq: Once | SUBCUTANEOUS | Status: AC
  Administered 2018-11-26: 6 mg via SUBCUTANEOUS

## 2018-11-26 MED ORDER — PEGFILGRASTIM-CBQV 6 MG/0.6ML ~~LOC~~ SOSY
PREFILLED_SYRINGE | SUBCUTANEOUS | Status: AC
  Filled 2018-11-26: qty 0.6

## 2018-11-26 NOTE — Patient Instructions (Signed)
Pegfilgrastim injection  What is this medicine?  PEGFILGRASTIM (PEG fil gra stim) is a long-acting granulocyte colony-stimulating factor that stimulates the growth of neutrophils, a type of white blood cell important in the body's fight against infection. It is used to reduce the incidence of fever and infection in patients with certain types of cancer who are receiving chemotherapy that affects the bone marrow, and to increase survival after being exposed to high doses of radiation.  This medicine may be used for other purposes; ask your health care provider or pharmacist if you have questions.  COMMON BRAND NAME(S): Fulphila, Neulasta, UDENYCA  What should I tell my health care provider before I take this medicine?  They need to know if you have any of these conditions:  -kidney disease  -latex allergy  -ongoing radiation therapy  -sickle cell disease  -skin reactions to acrylic adhesives (On-Body Injector only)  -an unusual or allergic reaction to pegfilgrastim, filgrastim, other medicines, foods, dyes, or preservatives  -pregnant or trying to get pregnant  -breast-feeding  How should I use this medicine?  This medicine is for injection under the skin. If you get this medicine at home, you will be taught how to prepare and give the pre-filled syringe or how to use the On-body Injector. Refer to the patient Instructions for Use for detailed instructions. Use exactly as directed. Tell your healthcare provider immediately if you suspect that the On-body Injector may not have performed as intended or if you suspect the use of the On-body Injector resulted in a missed or partial dose.  It is important that you put your used needles and syringes in a special sharps container. Do not put them in a trash can. If you do not have a sharps container, call your pharmacist or healthcare provider to get one.  Talk to your pediatrician regarding the use of this medicine in children. While this drug may be prescribed for  selected conditions, precautions do apply.  Overdosage: If you think you have taken too much of this medicine contact a poison control center or emergency room at once.  NOTE: This medicine is only for you. Do not share this medicine with others.  What if I miss a dose?  It is important not to miss your dose. Call your doctor or health care professional if you miss your dose. If you miss a dose due to an On-body Injector failure or leakage, a new dose should be administered as soon as possible using a single prefilled syringe for manual use.  What may interact with this medicine?  Interactions have not been studied.  Give your health care provider a list of all the medicines, herbs, non-prescription drugs, or dietary supplements you use. Also tell them if you smoke, drink alcohol, or use illegal drugs. Some items may interact with your medicine.  This list may not describe all possible interactions. Give your health care provider a list of all the medicines, herbs, non-prescription drugs, or dietary supplements you use. Also tell them if you smoke, drink alcohol, or use illegal drugs. Some items may interact with your medicine.  What should I watch for while using this medicine?  You may need blood work done while you are taking this medicine.  If you are going to need a MRI, CT scan, or other procedure, tell your doctor that you are using this medicine (On-Body Injector only).  What side effects may I notice from receiving this medicine?  Side effects that you should report to   your doctor or health care professional as soon as possible:  -allergic reactions like skin rash, itching or hives, swelling of the face, lips, or tongue  -back pain  -dizziness  -fever  -pain, redness, or irritation at site where injected  -pinpoint red spots on the skin  -red or dark-brown urine  -shortness of breath or breathing problems  -stomach or side pain, or pain at the shoulder  -swelling  -tiredness  -trouble passing urine or  change in the amount of urine  Side effects that usually do not require medical attention (report to your doctor or health care professional if they continue or are bothersome):  -bone pain  -muscle pain  This list may not describe all possible side effects. Call your doctor for medical advice about side effects. You may report side effects to FDA at 1-800-FDA-1088.  Where should I keep my medicine?  Keep out of the reach of children.  If you are using this medicine at home, you will be instructed on how to store it. Throw away any unused medicine after the expiration date on the label.  NOTE: This sheet is a summary. It may not cover all possible information. If you have questions about this medicine, talk to your doctor, pharmacist, or health care provider.   2019 [employer] (2017-11-30 16:57:08)

## 2018-11-28 ENCOUNTER — Other Ambulatory Visit: Payer: Self-pay | Admitting: Cardiovascular Disease

## 2018-11-30 ENCOUNTER — Other Ambulatory Visit: Payer: Self-pay | Admitting: *Deleted

## 2018-11-30 DIAGNOSIS — C3492 Malignant neoplasm of unspecified part of left bronchus or lung: Secondary | ICD-10-CM
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
## 2018-12-01 ENCOUNTER — Inpatient Hospital Stay: Payer: Medicare Other

## 2018-12-01 ENCOUNTER — Other Ambulatory Visit: Payer: Self-pay

## 2018-12-01 ENCOUNTER — Other Ambulatory Visit: Payer: No Typology Code available for payment source

## 2018-12-01 ENCOUNTER — Inpatient Hospital Stay (HOSPITAL_BASED_OUTPATIENT_CLINIC_OR_DEPARTMENT_OTHER): Payer: Medicare Other | Admitting: Hematology

## 2018-12-01 VITALS — BP 108/66 | HR 106 | Temp 98.2°F | Resp 18 | Ht 71.0 in | Wt 132.6 lb

## 2018-12-01 DIAGNOSIS — C3412 Malignant neoplasm of upper lobe, left bronchus or lung: Secondary | ICD-10-CM

## 2018-12-01 DIAGNOSIS — M545 Low back pain: Secondary | ICD-10-CM | POA: Diagnosis not present

## 2018-12-01 DIAGNOSIS — Z720 Tobacco use: Secondary | ICD-10-CM | POA: Diagnosis not present

## 2018-12-01 DIAGNOSIS — K5900 Constipation, unspecified: Secondary | ICD-10-CM | POA: Diagnosis not present

## 2018-12-01 DIAGNOSIS — R0602 Shortness of breath: Secondary | ICD-10-CM | POA: Diagnosis not present

## 2018-12-01 DIAGNOSIS — C7951 Secondary malignant neoplasm of bone: Secondary | ICD-10-CM

## 2018-12-01 DIAGNOSIS — C3492 Malignant neoplasm of unspecified part of left bronchus or lung: Secondary | ICD-10-CM

## 2018-12-01 DIAGNOSIS — R634 Abnormal weight loss: Secondary | ICD-10-CM | POA: Diagnosis not present

## 2018-12-01 DIAGNOSIS — Z8589 Personal history of malignant neoplasm of other organs and systems: Secondary | ICD-10-CM | POA: Diagnosis not present

## 2018-12-01 DIAGNOSIS — Z8551 Personal history of malignant neoplasm of bladder: Secondary | ICD-10-CM

## 2018-12-01 DIAGNOSIS — R52 Pain, unspecified: Secondary | ICD-10-CM

## 2018-12-01 LAB — CMP (CANCER CENTER ONLY)
ALT: 17 U/L (ref 0–44)
AST: 15 U/L (ref 15–41)
Albumin: 2.8 g/dL — ABNORMAL LOW (ref 3.5–5.0)
Alkaline Phosphatase: 133 U/L — ABNORMAL HIGH (ref 38–126)
Anion gap: 9 (ref 5–15)
BUN: 22 mg/dL (ref 8–23)
CO2: 29 mmol/L (ref 22–32)
Calcium: 9.3 mg/dL (ref 8.9–10.3)
Chloride: 103 mmol/L (ref 98–111)
Creatinine: 0.89 mg/dL (ref 0.61–1.24)
GFR, Est AFR Am: >60 mL/min (ref >60)
GFR, Estimated: >60 mL/min (ref >60)
Glucose, Bld: 114 mg/dL — ABNORMAL HIGH (ref 70–99)
Potassium: 4.5 mmol/L (ref 3.5–5.1)
Sodium: 141 mmol/L (ref 135–145)
Total Bilirubin: 0.5 mg/dL (ref 0.3–1.2)
Total Protein: 6.5 g/dL (ref 6.5–8.1)

## 2018-12-01 LAB — CBC WITH DIFFERENTIAL (CANCER CENTER ONLY)
Abs Immature Granulocytes: 0.17 10*3/uL — ABNORMAL HIGH (ref 0.00–0.07)
Basophils Absolute: 0.1 10*3/uL (ref 0.0–0.1)
Basophils Relative: 0 %
Eosinophils Absolute: 0 10*3/uL (ref 0.0–0.5)
Eosinophils Relative: 0 %
HEMATOCRIT: 35 % — AB (ref 39.0–52.0)
Hemoglobin: 11.3 g/dL — ABNORMAL LOW (ref 13.0–17.0)
Immature Granulocytes: 1 %
Lymphocytes Relative: 1 %
Lymphs Abs: 0.2 10*3/uL — ABNORMAL LOW (ref 0.7–4.0)
MCH: 30.9 pg (ref 26.0–34.0)
MCHC: 32.3 g/dL (ref 30.0–36.0)
MCV: 95.6 fL (ref 80.0–100.0)
MONOS PCT: 8 %
Monocytes Absolute: 1.3 10*3/uL — ABNORMAL HIGH (ref 0.1–1.0)
Neutro Abs: 15.1 10*3/uL — ABNORMAL HIGH (ref 1.7–7.7)
Neutrophils Relative %: 90 %
Platelet Count: 164 10*3/uL (ref 150–400)
RBC: 3.66 MIL/uL — ABNORMAL LOW (ref 4.22–5.81)
RDW: 14.1 % (ref 11.5–15.5)
WBC Count: 16.9 10*3/uL — ABNORMAL HIGH (ref 4.0–10.5)
nRBC: 0 % (ref 0.0–0.2)

## 2018-12-05 ENCOUNTER — Other Ambulatory Visit: Payer: Self-pay | Admitting: Cardiovascular Disease

## 2018-12-08 ENCOUNTER — Ambulatory Visit: Payer: No Typology Code available for payment source | Admitting: Urology

## 2018-12-09 ENCOUNTER — Telehealth: Payer: Self-pay | Admitting: Urology

## 2018-12-09 DIAGNOSIS — C7951 Secondary malignant neoplasm of bone: Secondary | ICD-10-CM

## 2018-12-09 NOTE — Telephone Encounter (Signed)
I spoke with the patient to conduct his routine scheduled 1 month follow up visit via telephone to spare the patient unnecessary potential exposure in the healthcare setting during the current COVID-19 pandemic.  The patient was notified in advance and gave permission to proceed with this visit format.   Radiation Oncology         [phone] ________________________________  Name: Kyle Foster MRN: [id number]  Date: 12/09/2018  DOB: 1946-04-08  Post Treatment Note  CC: Clinic, [facility]  No ref. provider found  Diagnosis:   73 yo man with left upper lung cancer invading spine at risk for cord compression     Interval Since Last Radiation:  5 weeks  10/21/2018 - 11/03/2018:  Spine, Thoracic / 30 Gy delivered in 10 fractions of 3 Gy  Narrative:  The patient tolerated radiation treatment relatively well. He reported back pain at an 8/10, generalized weakness, and shortness of breath throughout his treatment.  He denied numbness or tingling in his lower extremities. Towards the end of treatment, he reported moderate fatigue, nausea, dry mouth, and thick saliva but back pain was improved.         On review of systems, the patient states that he is doing fairly well in general with improved low back pain but still requiring narcotic pain medications on a regular schedule every 4 hours.  Since completing radiation treatments he has undergone MRI brain imaging as well as PET imaging to complete his disease staging.  Fortunately, the brain MRI did not show any evidence of intracranial metastasis and the PET scan confirmed locally advanced disease but did not show any evidence of hypermetabolic distant metastasis or extrathoracic metastasis.  His molecular studies did show 30% PDL 1 tumor proportion indicating that he would clearly benefit from immunotherapy treatment in combination with systemic chemotherapy.  The current plan is to proceed with Carboplatin and Paclitaxel combined with  Pembrolizumab.  He had a port placed on 11/23/2018 and treatment started on 11/24/2018.  So far, he feels that he is tolerating this treatment quite well.  He denies any recent fevers, chills, night sweats, increased shortness of breath, increased cough or chest pain.  He will occasionally have bouts of hemoptysis with coughing episodes but reports that this is rare and small volume.  He admits that his appetite is decreased but he is using Boost supplements to maintain his weight and has recently gained a few pounds.  He is using MiraLAX regularly to help with the constipation associated with his narcotic pain medications.  Overall, he is pleased with his progress to date.  ALLERGIES:  is allergic to codeine.  Meds: Current Outpatient Medications  Medication Sig Dispense Refill   atorvastatin (LIPITOR) 80 MG tablet TAKE 1 TABLET BY MOUTH EVERY DAY AT 6PM **NEED OFFICE VISIT** 30 tablet 2   calcium carbonate (TUMS - DOSED IN MG ELEMENTAL CALCIUM) 500 MG chewable tablet Chew 1 tablet (200 mg of elemental calcium total) by mouth 3 (three) times daily with meals. 30 tablet 3   Cholecalciferol (VITAMIN D) 2000 UNITS tablet Take 2,000 Units by mouth 2 (two) times daily.     clopidogrel (PLAVIX) 75 MG tablet TAKE 1 TABLET BY MOUTH DAILY **NEED OFFICE VISIT** 30 tablet 2   dexamethasone (DECADRON) 2 MG tablet Take 1 tablet (2 mg total) by mouth daily with breakfast. 10 tablet 0   dexamethasone (DECADRON) 4 MG tablet Take 2 tablets by mouth once a day on the day after chemotherapy and  then take 2 tablets two times a day for 2 days. Take with food. 30 tablet 1   fentaNYL (DURAGESIC) 12 MCG/HR Place 1 patch onto the skin every 3 (three) days. 10 patch 0   gabapentin (NEURONTIN) 300 MG capsule Take 1 capsule (300 mg total) by mouth 3 (three) times daily. 90 capsule 1   Hypromellose (ARTIFICIAL TEARS OP) Place 1 drop into both eyes at bedtime.      lidocaine-prilocaine (EMLA) cream Apply 1 application  topically as needed. 30 g 1   LORazepam (ATIVAN) 0.5 MG tablet Take 1 tablet (0.5 mg total) by mouth every 6 (six) hours as needed (Nausea or vomiting). 30 tablet 0   metoprolol tartrate (LOPRESSOR) 25 MG tablet Take 1 tablet (25 mg total) by mouth 2 (two) times daily. Please make annual appt for future refills.  Thank you 180 tablet 0   morphine (MSIR) 15 MG tablet Take 1 tablet (15 mg total) by mouth every 6 (six) hours as needed for moderate pain or severe pain. 90 tablet 0   ondansetron (ZOFRAN) 8 MG tablet Take 1 tablet (8 mg total) by mouth every 8 (eight) hours as needed for nausea or vomiting. 30 tablet 1   pantoprazole (PROTONIX) 40 MG tablet TAKE 1 TABLET BY MOUTH EVERY DAY *MUST KEEP OV* 90 tablet 0   prochlorperazine (COMPAZINE) 10 MG tablet Take 1 tablet (10 mg total) by mouth every 6 (six) hours as needed for nausea or vomiting. 30 tablet 0   No current facility-administered medications for this visit.     Physical Findings: Not performed due to telephone visit format.  Lab Findings: Lab Results  Component Value Date   WBC 16.9 (H) 12/01/2018   HGB 11.3 (L) 12/01/2018   HCT 35.0 (L) 12/01/2018   MCV 95.6 12/01/2018   PLT 164 12/01/2018     Radiographic Findings: Nm Pet Image Initial (pi) Skull Base To Thigh  Result Date: 11/10/2018 CLINICAL DATA:  Initial treatment strategy for staging of non-small-cell lung cancer. EXAM: NUCLEAR MEDICINE PET SKULL BASE TO THIGH TECHNIQUE: 7.8 mCi F-18 FDG was injected intravenously. Full-ring PET imaging was performed from the skull base to thigh after the radiotracer. CT data was obtained and used for attenuation correction and anatomic localization. Fasting blood glucose: 113 mg/dl COMPARISON:  Chest CT 10/14/2018.  Thoracic spine MRI of 10/15/2018. FINDINGS: Mediastinal blood pool activity: SUV max 1.8 NECK: Primarily lower cervical hypermetabolism is favored to be due to a combination of muscular activity and hypermetabolic "brown"  fat. Given this mild limitation, no cervical hypermetabolism identified. Incidental CT findings: Surgical changes of left neck dissection. Left carotid atherosclerosis. No cervical adenopathy. CHEST: Partially cavitary posterior left lung (centered at the apex of the superior segment left lower lobe) mass with extension into the canal. This has been detailed on prior CT and MRI. Measures on the order of 6.5 x 4.8 cm and a S.U.V. max of 54.2 on image 65/4. Direct extension versus nodal metastasis within the left perihilar region. Example at 2.6 cm and a S.U.V. max of 33.2 on image 75/4. Incidental CT findings: Aortic and advanced coronary artery calcification. Moderate bullous type emphysema. ABDOMEN/PELVIS: No abdominopelvic parenchymal or nodal hypermetabolism. Incidental CT findings: Normal adrenal glands. Upper pole right renal collecting system calculus. 8.9 cm upper pole left renal minimally complex cyst. Abdominal aortic atherosclerosis. Colonic stool burden suggests constipation. SKELETON: Activity about the lateral left hip suggests trochanteric bursitis. Direct tumor involvement into the T5-6 vertebral body. Osseous involvement of  medial fifth and sixth left ribs. Incidental CT findings: Probable bone islands within the pelvis. IMPRESSION: 1. Locally advanced left lung mass, as detailed previously. 2. Left perihilar direct extension versus nodal metastasis. 3. No findings of hypermetabolic distant metastasis. 4. No findings of extrathoracic hypermetabolic metastasis. Electronically Signed   By: Abigail Miyamoto M.D.   On: 11/10/2018 16:56   Ir Imaging Guided Port Insertion  Result Date: 11/23/2018 CLINICAL DATA:  Lung carcinoma, needs durable venous access for planned chemotherapy regimen EXAM: TUNNELED PORT CATHETER PLACEMENT WITH ULTRASOUND AND FLUOROSCOPIC GUIDANCE FLUOROSCOPY TIME:  18 seconds; 3 mGy ANESTHESIA/SEDATION: Intravenous Fentanyl 52mcg and Versed 1mg  were administered as conscious sedation  during continuous monitoring of the patient's level of consciousness and physiological / cardiorespiratory status by the radiology RN, with a total moderate sedation time of 20 minutes. TECHNIQUE: The procedure, risks, benefits, and alternatives were explained to the patient. Questions regarding the procedure were encouraged and answered. The patient understands and consents to the procedure. As antibiotic prophylaxis, cefazolin 2 g was ordered pre-procedure and administered intravenously within one hour of incision. Patency of the right IJ vein was confirmed with ultrasound with image documentation. An appropriate skin site was determined. Skin site was marked. Region was prepped using maximum barrier technique including cap and mask, sterile gown, sterile gloves, large sterile sheet, and Chlorhexidine as cutaneous antisepsis. The region was infiltrated locally with 1% lidocaine. Under real-time ultrasound guidance, the right IJ vein was accessed with a 21 gauge micropuncture needle; the needle tip within the vein was confirmed with ultrasound image documentation. Needle was exchanged over a 018 guidewire for transitional dilator which allowed passage of the [facility] wire into the IVC. Over this, the transitional dilator was exchanged for a 5 [place] MPA catheter. A small incision was made on the right anterior chest wall and a subcutaneous pocket fashioned. The power-injectable port was positioned and its catheter tunneled to the right IJ dermatotomy site. The MPA catheter was exchanged over an Amplatz wire for a peel-away sheath, through which the port catheter, which had been trimmed to the appropriate length, was advanced and positioned under fluoroscopy with its tip at the cavoatrial junction. Spot chest radiograph confirms good catheter position and no pneumothorax. The pocket was closed with deep interrupted and subcuticular continuous 3-0 Monocryl sutures. The port was flushed per protocol. The incisions were  covered with Dermabond then covered with a sterile dressing. COMPLICATIONS: COMPLICATIONS None immediate IMPRESSION: Technically successful right IJ power-injectable port catheter placement. Ready for routine use. Electronically Signed   By: Lucrezia Europe M.D.   On: 11/23/2018 19:44    Impression/Plan: 36. 73 yo man with left upper lung cancer invading spine at risk for cord compression. He reports that he is recovered well from the effects of his recent radiotherapy.  He continues with low back pain but this is fairly well-controlled with narcotic pain medications which are being managed by Dr. Lindi Adie and his palliative care team.  We discussed that while we are happy to continue to participate in his care if clinically indicated, at this point, we will plan to see him back on an as-needed basis.  He will continue in routine follow-up under the care and direction of Dr. Lindi Adie for continued disease management.     He knows to call at anytime with any questions or concerns related to his previous radiotherapy.  He appears to have a good understanding of our recommendations and is comfortable and in agreement with the stated plan.  Nicholos Johns, PA-C

## 2018-12-12 ENCOUNTER — Other Ambulatory Visit: Payer: Self-pay | Admitting: Hematology

## 2018-12-12 MED ORDER — MORPHINE SULFATE 15 MG PO TABS: 15.0000 mg | ORAL_TABLET | Freq: Four times a day (QID) | ORAL | 0 refills | Status: DC | PRN

## 2018-12-12 MED ORDER — FENTANYL 12 MCG/HR TD PT72: 1.0000 | MEDICATED_PATCH | TRANSDERMAL | 0 refills | Status: DC

## 2018-12-13 ENCOUNTER — Telehealth: Payer: Self-pay | Admitting: *Deleted

## 2018-12-13 ENCOUNTER — Telehealth: Payer: Self-pay | Admitting: Hematology

## 2018-12-13 NOTE — Telephone Encounter (Signed)
Called patient to inform of upcoming add-on infusion appt on 4/8. No answer. Left a message for patient and patients daughter.

## 2018-12-13 NOTE — Telephone Encounter (Signed)
Contacted by [facility] on behalf of patient. Spoke with Naima who states patient's EMLA creme is authorized by his insurance at this time.
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
## 2018-12-15 ENCOUNTER — Inpatient Hospital Stay: Payer: No Typology Code available for payment source

## 2018-12-15 ENCOUNTER — Other Ambulatory Visit: Payer: No Typology Code available for payment source

## 2018-12-15 ENCOUNTER — Inpatient Hospital Stay: Payer: No Typology Code available for payment source | Attending: Hematology | Admitting: Hematology

## 2018-12-15 ENCOUNTER — Other Ambulatory Visit: Payer: Self-pay

## 2018-12-15 ENCOUNTER — Telehealth: Payer: Self-pay | Admitting: Hematology

## 2018-12-15 ENCOUNTER — Ambulatory Visit: Payer: No Typology Code available for payment source

## 2018-12-15 VITALS — BP 111/67 | HR 89 | Temp 98.5°F | Resp 18 | Ht 71.0 in | Wt 130.8 lb

## 2018-12-15 DIAGNOSIS — Z5111 Encounter for antineoplastic chemotherapy: Secondary | ICD-10-CM | POA: Diagnosis present

## 2018-12-15 DIAGNOSIS — C7951 Secondary malignant neoplasm of bone: Secondary | ICD-10-CM

## 2018-12-15 DIAGNOSIS — C3412 Malignant neoplasm of upper lobe, left bronchus or lung: Secondary | ICD-10-CM

## 2018-12-15 DIAGNOSIS — Z5112 Encounter for antineoplastic immunotherapy: Secondary | ICD-10-CM | POA: Insufficient documentation

## 2018-12-15 DIAGNOSIS — Z5189 Encounter for other specified aftercare: Secondary | ICD-10-CM | POA: Insufficient documentation

## 2018-12-15 DIAGNOSIS — Z7189 Other specified counseling: Secondary | ICD-10-CM

## 2018-12-15 DIAGNOSIS — Z8551 Personal history of malignant neoplasm of bladder: Secondary | ICD-10-CM

## 2018-12-15 DIAGNOSIS — M545 Low back pain: Secondary | ICD-10-CM

## 2018-12-15 DIAGNOSIS — Z79899 Other long term (current) drug therapy: Secondary | ICD-10-CM | POA: Diagnosis not present

## 2018-12-15 DIAGNOSIS — C799 Secondary malignant neoplasm of unspecified site: Secondary | ICD-10-CM

## 2018-12-15 DIAGNOSIS — Z8589 Personal history of malignant neoplasm of other organs and systems: Secondary | ICD-10-CM

## 2018-12-15 DIAGNOSIS — C3492 Malignant neoplasm of unspecified part of left bronchus or lung: Secondary | ICD-10-CM

## 2018-12-15 DIAGNOSIS — Z720 Tobacco use: Secondary | ICD-10-CM | POA: Diagnosis not present

## 2018-12-15 LAB — CMP (CANCER CENTER ONLY)
ALT: 14 U/L (ref 0–44)
AST: 12 U/L — ABNORMAL LOW (ref 15–41)
Albumin: 2.7 g/dL — ABNORMAL LOW (ref 3.5–5.0)
Alkaline Phosphatase: 93 U/L (ref 38–126)
Anion gap: 8 (ref 5–15)
BUN: 22 mg/dL (ref 8–23)
CO2: 26 mmol/L (ref 22–32)
Calcium: 8.8 mg/dL — ABNORMAL LOW (ref 8.9–10.3)
Chloride: 106 mmol/L (ref 98–111)
Creatinine: 0.8 mg/dL (ref 0.61–1.24)
GFR, Est AFR Am: >60 mL/min (ref >60)
GFR, Estimated: >60 mL/min (ref >60)
Glucose, Bld: 108 mg/dL — ABNORMAL HIGH (ref 70–99)
Potassium: 4.1 mmol/L (ref 3.5–5.1)
Sodium: 140 mmol/L (ref 135–145)
Total Bilirubin: 0.5 mg/dL (ref 0.3–1.2)
Total Protein: 6.2 g/dL — ABNORMAL LOW (ref 6.5–8.1)

## 2018-12-15 LAB — CBC WITH DIFFERENTIAL/PLATELET
Abs Immature Granulocytes: 0.12 10*3/uL — ABNORMAL HIGH (ref 0.00–0.07)
Basophils Absolute: 0.1 10*3/uL (ref 0.0–0.1)
Basophils Relative: 0 %
Eosinophils Absolute: 0 10*3/uL (ref 0.0–0.5)
Eosinophils Relative: 0 %
HCT: 33.9 % — ABNORMAL LOW (ref 39.0–52.0)
Hemoglobin: 10.9 g/dL — ABNORMAL LOW (ref 13.0–17.0)
Immature Granulocytes: 1 %
Lymphocytes Relative: 2 %
Lymphs Abs: 0.4 10*3/uL — ABNORMAL LOW (ref 0.7–4.0)
MCH: 31.7 pg (ref 26.0–34.0)
MCHC: 32.2 g/dL (ref 30.0–36.0)
MCV: 98.5 fL (ref 80.0–100.0)
Monocytes Absolute: 0.9 10*3/uL (ref 0.1–1.0)
Monocytes Relative: 4 %
Neutro Abs: 19.2 10*3/uL — ABNORMAL HIGH (ref 1.7–7.7)
Neutrophils Relative %: 93 %
Platelets: 217 10*3/uL (ref 150–400)
RBC: 3.44 MIL/uL — ABNORMAL LOW (ref 4.22–5.81)
RDW: 14.9 % (ref 11.5–15.5)
WBC: 20.7 10*3/uL — ABNORMAL HIGH (ref 4.0–10.5)
nRBC: 0 % (ref 0.0–0.2)

## 2018-12-15 LAB — FERRITIN: Ferritin: 375 ng/mL — ABNORMAL HIGH (ref 24–336)

## 2018-12-15 LAB — TSH: TSH: 1.432 u[IU]/mL (ref 0.320–4.118)

## 2018-12-15 MED ORDER — SODIUM CHLORIDE 0.9 % IV SOLN
Freq: Once | INTRAVENOUS | Status: AC
  Administered 2018-12-15: 10:00:00 via INTRAVENOUS
  Filled 2018-12-15: qty 250

## 2018-12-15 MED ORDER — DENOSUMAB 120 MG/1.7ML ~~LOC~~ SOLN
120.0000 mg | Freq: Once | SUBCUTANEOUS | Status: AC
  Administered 2018-12-15: 120 mg via SUBCUTANEOUS

## 2018-12-15 MED ORDER — DIPHENHYDRAMINE HCL 50 MG/ML IJ SOLN
50.0000 mg | Freq: Once | INTRAMUSCULAR | Status: AC
  Administered 2018-12-15: 10:00:00 50 mg via INTRAVENOUS

## 2018-12-15 MED ORDER — SODIUM CHLORIDE 0.9 % IV SOLN
20.0000 mg | Freq: Once | INTRAVENOUS | Status: AC
  Administered 2018-12-15: 20 mg via INTRAVENOUS
  Filled 2018-12-15: qty 2

## 2018-12-15 MED ORDER — DIPHENHYDRAMINE HCL 50 MG/ML IJ SOLN
INTRAMUSCULAR | Status: AC
  Filled 2018-12-15: qty 1

## 2018-12-15 MED ORDER — SODIUM CHLORIDE 0.9 % IV SOLN
200.0000 mg | Freq: Once | INTRAVENOUS | Status: AC
  Administered 2018-12-15: 200 mg via INTRAVENOUS
  Filled 2018-12-15: qty 8

## 2018-12-15 MED ORDER — SODIUM CHLORIDE 0.9 % IV SOLN
Freq: Once | INTRAVENOUS | Status: AC
  Administered 2018-12-15: 11:00:00 via INTRAVENOUS
  Filled 2018-12-15: qty 5

## 2018-12-15 MED ORDER — FENTANYL 25 MCG/HR TD PT72: 1.0000 | MEDICATED_PATCH | TRANSDERMAL | 0 refills | Status: DC

## 2018-12-15 MED ORDER — PALONOSETRON HCL INJECTION 0.25 MG/5ML
INTRAVENOUS | Status: AC
  Filled 2018-12-15: qty 5

## 2018-12-15 MED ORDER — SODIUM CHLORIDE 0.9 % IV SOLN
150.0000 mg/m2 | Freq: Once | INTRAVENOUS | Status: AC
  Administered 2018-12-15: 12:00:00 258 mg via INTRAVENOUS
  Filled 2018-12-15: qty 43

## 2018-12-15 MED ORDER — PALONOSETRON HCL INJECTION 0.25 MG/5ML
0.2500 mg | Freq: Once | INTRAVENOUS | Status: AC
  Administered 2018-12-15: 10:00:00 0.25 mg via INTRAVENOUS

## 2018-12-15 MED ORDER — HEPARIN SOD (PORK) LOCK FLUSH 100 UNIT/ML IV SOLN
500.0000 [IU] | Freq: Once | INTRAVENOUS | Status: AC | PRN
  Administered 2018-12-15: 500 [IU]
  Filled 2018-12-15: qty 5

## 2018-12-15 MED ORDER — SODIUM CHLORIDE 0.9% FLUSH
10.0000 mL | INTRAVENOUS | Status: DC | PRN
  Administered 2018-12-15: 10 mL
  Filled 2018-12-15: qty 10

## 2018-12-15 MED ORDER — DENOSUMAB 120 MG/1.7ML ~~LOC~~ SOLN
SUBCUTANEOUS | Status: AC
  Filled 2018-12-15: qty 1.7

## 2018-12-15 MED ORDER — MORPHINE SULFATE 15 MG PO TABS: 15.0000 mg | ORAL_TABLET | ORAL | 0 refills | Status: DC | PRN

## 2018-12-15 MED ORDER — SODIUM CHLORIDE 0.9 % IV SOLN
412.0000 mg | Freq: Once | INTRAVENOUS | Status: AC
  Administered 2018-12-15: 410 mg via INTRAVENOUS
  Filled 2018-12-15: qty 41

## 2018-12-15 MED ORDER — FAMOTIDINE IN NACL 20-0.9 MG/50ML-% IV SOLN: 20.0000 mg | Freq: Once | INTRAVENOUS | Status: DC

## 2018-12-15 NOTE — Telephone Encounter (Signed)
Per 4/8 los,appts already scheduled.

## 2018-12-15 NOTE — Patient Instructions (Signed)
[facility] Discharge Instructions for Patients Receiving Chemotherapy  Today you received the following chemotherapy agents Keytruda, Taxol, and Carboplatin.  To help prevent nausea and vomiting after your treatment, we encourage you to take your nausea medication as directed.  If you develop nausea and vomiting that is not controlled by your nausea medication, call the clinic.   BELOW ARE SYMPTOMS THAT SHOULD BE REPORTED IMMEDIATELY:  *FEVER GREATER THAN 100.5 F  *CHILLS WITH OR WITHOUT FEVER  NAUSEA AND VOMITING THAT IS NOT CONTROLLED WITH YOUR NAUSEA MEDICATION  *UNUSUAL SHORTNESS OF BREATH  *UNUSUAL BRUISING OR BLEEDING  TENDERNESS IN MOUTH AND THROAT WITH OR WITHOUT PRESENCE OF ULCERS  *URINARY PROBLEMS  *BOWEL PROBLEMS  UNUSUAL RASH Items with * indicate a potential emergency and should be followed up as soon as possible.  Feel free to call the clinic should you have any questions or concerns. The clinic phone number is [phone].  Please show the [place] at check-in to the Emergency Department and triage nurse.

## 2018-12-16 ENCOUNTER — Telehealth: Payer: Self-pay | Admitting: *Deleted

## 2018-12-16 NOTE — Telephone Encounter (Signed)
Wife called, LVM - [facility] to [facility]. [facility] [place] [phone]. Pharmacist states needs PA from [facility].  Number obtained for [facility] [phone], spoke with representative Ronalee Belts". Information given regarding diagnosis and other meds tried (MS CONTIN 15 mg q 12 and Fentanyl 12 mcg patch q 3 days).  Prior Auth applied for, Ronalee Belts placed in expedited status, office will be notified ([phone]/fax [phone]) within 24 hours. Attempted to contact family - LVM: Authorization for medicine applied for, decision to be made within 24 hours. The office will notify them. Contact office for further questions. [facility], updated Kim in pharmacy.

## 2018-12-16 NOTE — Telephone Encounter (Signed)
Notified wife (husband not available - in the yard) MD office received fax from [facility] that Fentanyl 67mcg is approved. [facility]ified [facility] of same. Wife verbalized understanding.

## 2018-12-17 ENCOUNTER — Inpatient Hospital Stay: Payer: No Typology Code available for payment source

## 2018-12-17 ENCOUNTER — Other Ambulatory Visit: Payer: Self-pay

## 2018-12-17 VITALS — BP 132/64 | HR 72 | Temp 98.0°F | Resp 18

## 2018-12-17 DIAGNOSIS — C3492 Malignant neoplasm of unspecified part of left bronchus or lung: Secondary | ICD-10-CM

## 2018-12-17 DIAGNOSIS — C799 Secondary malignant neoplasm of unspecified site: Secondary | ICD-10-CM

## 2018-12-17 DIAGNOSIS — C7951 Secondary malignant neoplasm of bone: Secondary | ICD-10-CM

## 2018-12-17 DIAGNOSIS — Z7189 Other specified counseling: Secondary | ICD-10-CM

## 2018-12-17 DIAGNOSIS — Z5112 Encounter for antineoplastic immunotherapy: Secondary | ICD-10-CM | POA: Diagnosis not present

## 2018-12-17 MED ORDER — PEGFILGRASTIM-CBQV 6 MG/0.6ML ~~LOC~~ SOSY
PREFILLED_SYRINGE | SUBCUTANEOUS | Status: AC
  Filled 2018-12-17: qty 0.6

## 2018-12-17 MED ORDER — PEGFILGRASTIM-CBQV 6 MG/0.6ML ~~LOC~~ SOSY
6.0000 mg | PREFILLED_SYRINGE | Freq: Once | SUBCUTANEOUS | Status: AC
  Administered 2018-12-17: 6 mg via SUBCUTANEOUS

## 2018-12-19 ENCOUNTER — Other Ambulatory Visit: Payer: Self-pay | Admitting: Hematology

## 2018-12-22 ENCOUNTER — Inpatient Hospital Stay: Payer: No Typology Code available for payment source | Admitting: Nutrition

## 2018-12-22 NOTE — Progress Notes (Signed)
RD working remotely.  I contacted patient by telephone.  He did not answer and I was unable to leave a voicemail message. I will try to follow-up with patient by phone.

## 2018-12-23 ENCOUNTER — Telehealth: Payer: Self-pay | Admitting: Hematology

## 2018-12-23 NOTE — Telephone Encounter (Signed)
Mailed medical records to patient. Release ZH#[id number]

## 2018-12-27 ENCOUNTER — Telehealth: Payer: Self-pay | Admitting: Urology

## 2018-12-27 NOTE — Telephone Encounter (Signed)
Kyle Foster had requested for medical records to be sent to patient home. Kyle Foster was also on the phone to confirm this. Confirmed patient information and confirmed with Freeman Caldron, PA ok to send. Sent medical records. [phone]

## 2018-12-30 ENCOUNTER — Telehealth: Payer: Self-pay | Admitting: *Deleted

## 2018-12-30 NOTE — Telephone Encounter (Signed)
Neulasta Onpro Patient Outreach Note  Patient was contacted on 12/29/2018 in regards to switching G-CSF therapy to Neulasta Onpro ([email or web address]). Patient was educated on the purpose of this proposed change in therapy due to COVID-19 pandemic. Patient was educated about Neulasta Onpro on-body injector and patient will be provided with an educational video while in infusion on their next scheduled date if changed to Neulasta Onpro.   [x]  Patient agrees to change in therapy. Begin process to change to Neulasta Onpro therapy.  []  Patient does not agree to change in therapy. No change to Neulasta Onpro at this time.    Thank You,  Caprice Renshaw  12/30/2018 2:52 PM

## 2019-01-04 ENCOUNTER — Other Ambulatory Visit: Payer: Self-pay | Admitting: *Deleted

## 2019-01-04 DIAGNOSIS — C3492 Malignant neoplasm of unspecified part of left bronchus or lung: Secondary | ICD-10-CM

## 2019-01-04 NOTE — Progress Notes (Signed)
HEMATOLOGY/ONCOLOGY CLINIC NOTE  Date of Service: 01/05/2019  Patient Care Team: [facility], [facility] as PCP - General Gwenlyn Found Pearletha Forge, MD as PCP - Cardiology (Cardiology)  CHIEF COMPLAINTS/PURPOSE OF CONSULTATION:  Squamous cell lung cancer  HISTORY OF PRESENTING ILLNESS:    Kyle Foster is a wonderful 73 y.o. male who has been referred to [place] by Dr. Karie Kirks for evaluation and management of Lung Mass concerning for primary lung cancer.   he pt reports he has been having back pain on and off for about 3 months and was being worked up at the [facility] in [place] by his PCP . He reports it was being maanged as MSK pain and he was referred to PT but the symptoms got progressively worse. He presented to the ED on 10/14/18 with SOB and midsternal chest pain, neck pain, and back pain which had been constant for the last 2-3 weeks.   Of note prior to the patient's visit today, pt has had a CTA Chest completed on 10/14/18 with results revealing 5.6 cm left upper lobe lesion with chest wall and thoracic spine invasion, possible epidural extension of tumor. 2. 2.7 cm left hilar mass occluding the left lower lobe pulmonary artery branch and probably attenuating or occluding the inferior left pulmonary vein. 3. Left lower lobe pulmonary nodules possibly metastatic. 4. Negative for acute PE or thoracic aortic dissection. 5. Coronary and Aortic Atherosclerosis.  Most recent lab results (10/15/18) of CBC is as follows: all values are WNL except for RBC at 3.76, [name], [name], Glucose at 111.  He subsequently had an MRI of the T spine which showed Large cavitary mass arising in the superior aspect of the left hilum and extending into the posteromedial aspect of the left upper lobe invading the left side of the T5 and T6 vertebra and extending into the spinal canal with a slight mass effect upon the spinal cord. There is a slight pathologic compression fracture of the T6 vertebral body. 2.  Probable small metastasis in the T8 vertebral body. 3. Metastatic pulmonary nodules at the left lung base posterolaterally. 4. The mass destroys the posterior aspects of the left fifth and sixth ribs.  Patient has had a h/o localized bladder cancer s/p TURBT in 2009. 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  Interval History:   [name] returns today for management, evaluation, and C3 treatment of his Squamous Cell Carcinoma Lung Cancer. The patient's last visit with [place] was on 12/15/18. The pt reports that he is doing well overall.  The pt reports that he tolerated C2 very well. He notes that his pain continues and he's taking 5-6 break through medications a day. He continues on his fentanyl patch. He notes that his pain is a 5 or 6 at baseline, and spikes to an 8. He notes that his pain increases when he "does anything, besides sitting in a recliner." He is able to walk and move around when he takes the short acting morphine. He endorses feeling SOB after walking around and endorses low energy levels. He does not have an inhaler available and has not had COPD diagnosed in the past. He notes that he wheezes occasionally. He denies worsened pain when breathing deeply. The pt notes that he is eating well and has been using Ensure as meal supplements, and has gained 1 pound since our last visit 3 weeks ago.  Lab results today (01/05/19) of CBC w/diff and CMP is as follows: all values are WNL except for WBC at 11.3k, RBC at 3.38, HGB at 10.6, HCT at 33.7, RDW at 15.9, ANC at 9.7k, Lymphs abs at 600, Calcium at 8.4, Total Protein at 6.3, Albumin at 2.8, AST at 13. 01/05/19 TSH is pending  On review of systems, pt reports low energy levels, SOB when walking, pain when active, eating better, mild weight gain, and denies concerns for infections, abdominal pains, leg swelling, and any other symptoms.   MEDICAL HISTORY:  Past Medical History:   Diagnosis Date  . Bladder cancer ([place]) 10/2008  . CAD (coronary artery disease)   . Cancer of parotid gland ([place]) 12/2009   "squamous cell cancer attached to it; took the gland out"  . History of kidney stones   . Hyperlipidemia   . Hypertension   . Myocardial infarction ([place]) 06/2008  . Recurrent upper respiratory infection (URI)    09/01/11 saw PCP - Kathryne Eriksson , antibiotic  and prednisone   . Skin cancer    "cut & burned off arms, hands, face, neck" (06/14/2018)    SURGICAL HISTORY: Past Surgical History:  Procedure Laterality Date  . CATARACT EXTRACTION W/ INTRAOCULAR LENS IMPLANT Right 12/2007  . CORONARY ANGIOPLASTY WITH STENT PLACEMENT  06/2008   "3 stents" (06/14/2018)  . CYSTOSCOPY W/ RETROGRADES  09/22/2011   Procedure: CYSTOSCOPY WITH RETROGRADE PYELOGRAM;  Surgeon: Bernestine Amass, MD;  Location: [facility];  Service: Urology;  Laterality: Left;  Cystoscopy left Retrograde Pyelogram      (c-arm)   . CYSTOSCOPY WITH BIOPSY  09/22/2011   Procedure: CYSTOSCOPY WITH BIOPSY;  Surgeon: Bernestine Amass, MD;  Location: [facility];  Service: Urology;  Laterality: N/A;   Biopsy  . EXCISIONAL HEMORRHOIDECTOMY  ~ 2006  . EYE SURGERY Left 04/2011   "reconstruction; gold weight in eye lid " (06/14/2018)  . FOOT NEUROMA SURGERY Left   . INGUINAL HERNIA REPAIR Right 02/2018  . IR IMAGING GUIDED PORT INSERTION  11/23/2018  . NM MYOCAR PERF WALL MOTION  07/11/2008   MILD ISCHEMIA IN THE BASL INFERIOR, MID INFERIOR & APICAL INFERIOR REGIONS  . 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 SKIN CANCER EXCISION     "arms, hands, face, neck" (06/14/2018)  . TRANSURETHRAL RESECTION OF BLADDER TUMOR WITH GYRUS (TURBT-GYRUS)  2010    SOCIAL HISTORY: Social History   Socioeconomic History  . Marital status: Married    Spouse name: Not on file  . Number of children: Not on file  . Years of education: Not on file  . Highest education level: Not  on file  Occupational History  . Not on file  Social Needs  . Financial resource strain: Not on file  . Food insecurity:    Worry: Not on file    Inability: Not on file  . Transportation needs:    Medical: Not on file    Non-medical: Not on file  Tobacco Use  . Smoking status: Current Every Day Smoker    Packs/day: 0.50    Years: 50.00    Pack years: 25.00    Types: Cigarettes  . Smokeless tobacco: Never Used  Substance and Sexual Activity  . Alcohol use: Not Currently  . Drug use: Not Currently  . Sexual activity: Not on file  Lifestyle  . Physical activity:    Days per week: Not on file  Minutes per session: Not on file  . Stress: Not on file  Relationships  . Social connections:    Talks on phone: Not on file    Gets together: Not on file    Attends religious service: Not on file    Active member of club or organization: Not on file    Attends meetings of clubs or organizations: Not on file    Relationship status: Not on file  . Intimate partner violence:    Fear of current or ex partner: Not on file    Emotionally abused: Not on file    Physically abused: Not on file    Forced sexual activity: Not on file  Other Topics Concern  . Not on file  Social History Narrative  . Not on file    FAMILY HISTORY: Family History  Problem Relation Age of Onset  . Heart attack Mother 53  . Cancer Mother   . Stroke Father 64  . Brain cancer Brother     ALLERGIES:  is allergic to codeine.  MEDICATIONS:  Current Outpatient Medications  Medication Sig Dispense Refill  . albuterol (VENTOLIN HFA) 108 (90 Base) MCG/ACT inhaler Inhale 1-2 puffs into the lungs every 6 (six) hours as needed for wheezing or shortness of breath. 1 Inhaler 2  . atorvastatin (LIPITOR) 80 MG tablet TAKE 1 TABLET BY MOUTH EVERY DAY AT 6PM **NEED OFFICE VISIT** 30 tablet 2  . calcium carbonate (TUMS - DOSED IN MG ELEMENTAL CALCIUM) 500 MG chewable tablet Chew 1 tablet (200 mg of elemental calcium  total) by mouth 3 (three) times daily with meals. 30 tablet 3  . Cholecalciferol (VITAMIN D) 2000 UNITS tablet Take 2,000 Units by mouth 2 (two) times daily.    . clopidogrel (PLAVIX) 75 MG tablet TAKE 1 TABLET BY MOUTH DAILY **NEED OFFICE VISIT** 30 tablet 2  . dexamethasone (DECADRON) 2 MG tablet Take 1 tablet (2 mg total) by mouth daily with breakfast. 10 tablet 0  . dexamethasone (DECADRON) 4 MG tablet Take 2 tablets by mouth once a day on the day after chemotherapy and then take 2 tablets two times a day for 2 days. Take with food. 30 tablet 1  . fentaNYL (DURAGESIC) 25 MCG/HR Place 1 patch onto the skin every 3 (three) days. 10 patch 0  . gabapentin (NEURONTIN) 300 MG capsule Take 1 capsule (300 mg total) by mouth 3 (three) times daily. 90 capsule 1  . Hypromellose (ARTIFICIAL TEARS OP) Place 1 drop into both eyes at bedtime.     . lidocaine-prilocaine (EMLA) cream Apply 1 application topically as needed. 30 g 1  . LORazepam (ATIVAN) 0.5 MG tablet Take 1 tablet (0.5 mg total) by mouth every 6 (six) hours as needed (Nausea or vomiting). 30 tablet 0  . metoprolol tartrate (LOPRESSOR) 25 MG tablet Take 1 tablet (25 mg total) by mouth 2 (two) times daily. Please make annual appt for future refills.  Thank you 180 tablet 0  . morphine (MSIR) 15 MG tablet Take 1-2 tablets (15-30 mg total) by mouth every 4 (four) hours as needed for moderate pain or severe pain. 120 tablet 0  . ondansetron (ZOFRAN) 8 MG tablet Take 1 tablet (8 mg total) by mouth every 8 (eight) hours as needed for nausea or vomiting. 30 tablet 1  . pantoprazole (PROTONIX) 40 MG tablet TAKE 1 TABLET BY MOUTH EVERY DAY *MUST KEEP OV* 90 tablet 0  . prochlorperazine (COMPAZINE) 10 MG tablet Take 1 tablet (10 mg total)  by mouth every 6 (six) hours as needed for nausea or vomiting. 30 tablet 1   No current facility-administered medications for this visit.     REVIEW OF SYSTEMS:    A 10+ POINT REVIEW OF SYSTEMS WAS OBTAINED including  neurology, dermatology, psychiatry, cardiac, respiratory, lymph, extremities, GI, GU, Musculoskeletal, constitutional, breasts, reproductive, HEENT.  All pertinent positives are noted in the HPI.  All others are negative.   PHYSICAL EXAMINATION: ECOG PERFORMANCE STATUS: 2 - Symptomatic, <50% confined to bed  Vitals:   01/05/19 0932  BP: 91/62  Pulse: 87  Resp: 18  Temp: 97.7 F (36.5 C)  SpO2: 98%   Filed Weights   01/05/19 0932  Weight: 131 lb 12.8 oz (59.8 kg)   .Body mass index is 18.38 kg/m.  GENERAL:alert, in no acute distress and comfortable SKIN: no acute rashes, no significant lesions EYES: conjunctiva are pink and non-injected, sclera anicteric OROPHARYNX: MMM, no exudates, no oropharyngeal erythema or ulceration NECK: supple, no JVD LYMPH:  no palpable lymphadenopathy in the cervical, axillary or inguinal regions LUNGS: few scattered rhonchi, no rales HEART: regular rate & rhythm ABDOMEN:  normoactive bowel sounds , non tender, not distended. No palpable hepatosplenomegaly.  Extremity: no pedal edema PSYCH: alert & oriented x 3 with fluent speech NEURO: no focal motor/sensory deficits   LABORATORY DATA:  I have reviewed the data as listed  . CBC Latest Ref Rng & Units 01/05/2019 12/15/2018 12/01/2018  WBC 4.0 - 10.5 K/uL 11.3(H) 20.7(H) 16.9(H)  Hemoglobin 13.0 - 17.0 g/dL 10.6(L) 10.9(L) 11.3(L)  Hematocrit 39.0 - 52.0 % 33.7(L) 33.9(L) 35.0(L)  Platelets 150 - 400 K/uL 216 217 164    . CMP Latest Ref Rng & Units 01/05/2019 12/15/2018 12/01/2018  Glucose 70 - 99 mg/dL 82 108(H) 114(H)  BUN 8 - 23 mg/dL 14 22 22   Creatinine 0.61 - 1.24 mg/dL 0.74 0.80 0.89  Sodium 135 - 145 mmol/L 139 140 141  Potassium 3.5 - 5.1 mmol/L 4.3 4.1 4.5  Chloride 98 - 111 mmol/L 107 106 103  CO2 22 - 32 mmol/L 25 26 29   Calcium 8.9 - 10.3 mg/dL 8.4(L) 8.8(L) 9.3  Total Protein 6.5 - 8.1 g/dL 6.3(L) 6.2(L) 6.5  Total Bilirubin 0.3 - 1.2 mg/dL 0.3 0.5 0.5  Alkaline Phos 38 - 126  U/L 96 93 133(H)  AST 15 - 41 U/L 13(L) 12(L) 15  ALT 0 - 44 U/L 9 14 17         RADIOGRAPHIC STUDIES: I have personally reviewed the radiological images as listed and agreed with the findings in the report. No results found.  ASSESSMENT & PLAN:   73 y.o. male with  1. 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 10/19/18 Lung biopsy revealed Squamous Cell carcinoma S/p Palliative RT to LUL lung mass with 30Gy in 10 fractions between 10/22/18 and 11/03/18, due to impending cord compression  10/19/18 PDL-1 status report found 30% PDL-1 tumor proportion  11/06/18 MRI Brain revealed No intracranial metastatic disease identified. 2. Mild chronic microvascular ischemic changes and volume loss of the brain.  11/10/18 PET/CT revealed Locally advanced left lung mass, as detailed previously. 2. Left perihilar direct extension versus nodal metastasis. 3. No findings of hypermetabolic distant metastasis. 4. No findings of extrathoracic hypermetabolic metastasis.  2. 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 Smoker 4. 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  Has regularly followed up with ENT Dr. Fenton Malling 6. Cancer related pain  PLAN -Discussed pt labwork today, 01/05/19; WBC have held with increased dose, HGB slightly lower at 10.6, PLT normal. Chemistries are stable. -01/05/19 TSH is pending. -The pt has no prohibitive toxicities from continuing C3 Carboplatin AUC of 5, 183m/m2 Taxol, and Pembrolizumab with G-CSF support at this time. -Pt does not have ease of access to PCP at this time, and PFTs are not occurring presently due to [place]. Will order inhaler.  -Pursuing Carboplatin and Taxol once every 3 weeks, up to 4-6 cycles, combined with immunotherapy. Followed by maintenance immunotherapy.  -Continue Fentanyl patch to 253m/hr and continue short acting MS IR 1-2 pills every 4-6 hours as needed to optimize mx of cancer related pain -Continue Senna S two capsules at night, and Miralax once a day, back off if diarrhea develops -Xgeva every 4 weeks to reduce risk of fracture -Avoid pushing, pulling, lifting and bending  -Encouraged pt to walk on level ground, to continue eating well and use Boost or Ensure to supplement meals -Continue 2000 units Vitamin D BID -Continue follow up with our nutritional therapist BaErnestene Kielnd discussed the importance of maintaining food consumption -Continue with 2 high protein Boosts each day as meal supplement, and eating well in general -Recommended that the pt continue to eat well, drink at least 48-64 oz of water each day, and walk 20-30 minutes each day.  -Advised that pt wear a mask while in public and avoid going into public as much as possible -Will see the pt back in 3 weeks   -F/u for C4 with labs and MD as scheduled. May discontinue C4 Udenycha appointment since patient is now on Neulasta onpro. -continue Xgeva q4 weeks - plz schedule next 6 doses.   All of the patients questions were answered with apparent satisfaction. The patient knows to call the clinic with any problems, questions or concerns.  The total time spent in the appt was 25 minutes and more than 50% was on counseling and direct patient cares.    GaSullivan LoneD MS AAHIVMS SCSt [facility] Hospital [facility] Med CtrTAdvanced Endoscopy Center PLLCematology/Oncology Physician [facility]Medical [facility](Office):       [phone] cell):  [phone]):           [phone]/29/2020 10:09 AM  I, ScBaldwin Jamaicaam acting as a scribe for Dr. GaSullivan Lone  .I have reviewed the above documentation for accuracy and completeness, and I agree with the above. .GBrunetta GeneraD

## 2019-01-05 ENCOUNTER — Other Ambulatory Visit: Payer: Self-pay

## 2019-01-05 ENCOUNTER — Inpatient Hospital Stay: Payer: No Typology Code available for payment source

## 2019-01-05 ENCOUNTER — Inpatient Hospital Stay (HOSPITAL_BASED_OUTPATIENT_CLINIC_OR_DEPARTMENT_OTHER): Payer: No Typology Code available for payment source | Admitting: Hematology

## 2019-01-05 VITALS — BP 91/62 | HR 87 | Temp 97.7°F | Resp 18 | Ht 71.0 in | Wt 131.8 lb

## 2019-01-05 DIAGNOSIS — G893 Neoplasm related pain (acute) (chronic): Secondary | ICD-10-CM | POA: Diagnosis not present

## 2019-01-05 DIAGNOSIS — C3492 Malignant neoplasm of unspecified part of left bronchus or lung: Secondary | ICD-10-CM

## 2019-01-05 DIAGNOSIS — C3412 Malignant neoplasm of upper lobe, left bronchus or lung: Secondary | ICD-10-CM

## 2019-01-05 DIAGNOSIS — Z7189 Other specified counseling: Secondary | ICD-10-CM

## 2019-01-05 DIAGNOSIS — Z5111 Encounter for antineoplastic chemotherapy: Secondary | ICD-10-CM

## 2019-01-05 DIAGNOSIS — C799 Secondary malignant neoplasm of unspecified site: Secondary | ICD-10-CM

## 2019-01-05 DIAGNOSIS — C7951 Secondary malignant neoplasm of bone: Secondary | ICD-10-CM | POA: Diagnosis not present

## 2019-01-05 DIAGNOSIS — Z720 Tobacco use: Secondary | ICD-10-CM | POA: Diagnosis not present

## 2019-01-05 DIAGNOSIS — Z5112 Encounter for antineoplastic immunotherapy: Secondary | ICD-10-CM | POA: Diagnosis not present

## 2019-01-05 LAB — CBC WITH DIFFERENTIAL (CANCER CENTER ONLY)
Abs Immature Granulocytes: 0.07 10*3/uL (ref 0.00–0.07)
Basophils Absolute: 0 10*3/uL (ref 0.0–0.1)
Basophils Relative: 0 %
Eosinophils Absolute: 0.1 10*3/uL (ref 0.0–0.5)
Eosinophils Relative: 0 %
HCT: 33.7 % — ABNORMAL LOW (ref 39.0–52.0)
Hemoglobin: 10.6 g/dL — ABNORMAL LOW (ref 13.0–17.0)
Immature Granulocytes: 1 %
Lymphocytes Relative: 5 %
Lymphs Abs: 0.6 10*3/uL — ABNORMAL LOW (ref 0.7–4.0)
MCH: 31.4 pg (ref 26.0–34.0)
MCHC: 31.5 g/dL (ref 30.0–36.0)
MCV: 99.7 fL (ref 80.0–100.0)
Monocytes Absolute: 0.9 10*3/uL (ref 0.1–1.0)
Monocytes Relative: 8 %
Neutro Abs: 9.7 10*3/uL — ABNORMAL HIGH (ref 1.7–7.7)
Neutrophils Relative %: 86 %
Platelet Count: 216 10*3/uL (ref 150–400)
RBC: 3.38 MIL/uL — ABNORMAL LOW (ref 4.22–5.81)
RDW: 15.9 % — ABNORMAL HIGH (ref 11.5–15.5)
WBC Count: 11.3 10*3/uL — ABNORMAL HIGH (ref 4.0–10.5)
nRBC: 0 % (ref 0.0–0.2)

## 2019-01-05 LAB — CMP (CANCER CENTER ONLY)
ALT: 9 U/L (ref 0–44)
AST: 13 U/L — ABNORMAL LOW (ref 15–41)
Albumin: 2.8 g/dL — ABNORMAL LOW (ref 3.5–5.0)
Alkaline Phosphatase: 96 U/L (ref 38–126)
Anion gap: 7 (ref 5–15)
BUN: 14 mg/dL (ref 8–23)
CO2: 25 mmol/L (ref 22–32)
Calcium: 8.4 mg/dL — ABNORMAL LOW (ref 8.9–10.3)
Chloride: 107 mmol/L (ref 98–111)
Creatinine: 0.74 mg/dL (ref 0.61–1.24)
GFR, Est AFR Am: >60 mL/min (ref >60)
GFR, Estimated: >60 mL/min (ref >60)
Glucose, Bld: 82 mg/dL (ref 70–99)
Potassium: 4.3 mmol/L (ref 3.5–5.1)
Sodium: 139 mmol/L (ref 135–145)
Total Bilirubin: 0.3 mg/dL (ref 0.3–1.2)
Total Protein: 6.3 g/dL — ABNORMAL LOW (ref 6.5–8.1)

## 2019-01-05 LAB — TSH: TSH: 1.655 u[IU]/mL (ref 0.320–4.118)

## 2019-01-05 MED ORDER — ALBUTEROL SULFATE HFA 108 (90 BASE) MCG/ACT IN AERS: 1.0000 | INHALATION_SPRAY | Freq: Four times a day (QID) | RESPIRATORY_TRACT | 2 refills | Status: DC | PRN

## 2019-01-05 MED ORDER — PALONOSETRON HCL INJECTION 0.25 MG/5ML
INTRAVENOUS | Status: AC
  Filled 2019-01-05: qty 5

## 2019-01-05 MED ORDER — SODIUM CHLORIDE 0.9 % IV SOLN
200.0000 mg | Freq: Once | INTRAVENOUS | Status: AC
  Administered 2019-01-05: 200 mg via INTRAVENOUS
  Filled 2019-01-05: qty 8

## 2019-01-05 MED ORDER — DIPHENHYDRAMINE HCL 50 MG/ML IJ SOLN
INTRAMUSCULAR | Status: AC
  Filled 2019-01-05: qty 1

## 2019-01-05 MED ORDER — SODIUM CHLORIDE 0.9 % IV SOLN
412.0000 mg | Freq: Once | INTRAVENOUS | Status: AC
  Administered 2019-01-05: 410 mg via INTRAVENOUS
  Filled 2019-01-05: qty 41

## 2019-01-05 MED ORDER — DIPHENHYDRAMINE HCL 50 MG/ML IJ SOLN
50.0000 mg | Freq: Once | INTRAMUSCULAR | Status: AC
  Administered 2019-01-05: 50 mg via INTRAVENOUS

## 2019-01-05 MED ORDER — SODIUM CHLORIDE 0.9 % IV SOLN
Freq: Once | INTRAVENOUS | Status: AC
  Administered 2019-01-05: 11:00:00 via INTRAVENOUS
  Filled 2019-01-05: qty 5

## 2019-01-05 MED ORDER — PROCHLORPERAZINE MALEATE 10 MG PO TABS: 10.0000 mg | ORAL_TABLET | Freq: Four times a day (QID) | ORAL | 1 refills | Status: DC | PRN

## 2019-01-05 MED ORDER — SODIUM CHLORIDE 0.9 % IV SOLN
150.0000 mg/m2 | Freq: Once | INTRAVENOUS | Status: AC
  Administered 2019-01-05: 258 mg via INTRAVENOUS
  Filled 2019-01-05: qty 43

## 2019-01-05 MED ORDER — HEPARIN SOD (PORK) LOCK FLUSH 100 UNIT/ML IV SOLN
500.0000 [IU] | Freq: Once | INTRAVENOUS | Status: AC | PRN
  Administered 2019-01-05: 500 [IU]
  Filled 2019-01-05: qty 5

## 2019-01-05 MED ORDER — PALONOSETRON HCL INJECTION 0.25 MG/5ML
0.2500 mg | Freq: Once | INTRAVENOUS | Status: AC
  Administered 2019-01-05: 0.25 mg via INTRAVENOUS

## 2019-01-05 MED ORDER — PEGFILGRASTIM 6 MG/0.6ML ~~LOC~~ PSKT
6.0000 mg | PREFILLED_SYRINGE | Freq: Once | SUBCUTANEOUS | Status: AC
  Administered 2019-01-05: 6 mg via SUBCUTANEOUS

## 2019-01-05 MED ORDER — FAMOTIDINE IN NACL 20-0.9 MG/50ML-% IV SOLN
INTRAVENOUS | Status: AC
  Filled 2019-01-05: qty 50

## 2019-01-05 MED ORDER — PEGFILGRASTIM 6 MG/0.6ML ~~LOC~~ PSKT
PREFILLED_SYRINGE | SUBCUTANEOUS | Status: AC
  Filled 2019-01-05: qty 0.6

## 2019-01-05 MED ORDER — MORPHINE SULFATE 15 MG PO TABS: 15.0000 mg | ORAL_TABLET | ORAL | 0 refills | Status: DC | PRN

## 2019-01-05 MED ORDER — SODIUM CHLORIDE 0.9% FLUSH
10.0000 mL | INTRAVENOUS | Status: DC | PRN
  Administered 2019-01-05: 10 mL
  Filled 2019-01-05: qty 10

## 2019-01-05 MED ORDER — FAMOTIDINE IN NACL 20-0.9 MG/50ML-% IV SOLN
20.0000 mg | Freq: Once | INTRAVENOUS | Status: AC
  Administered 2019-01-05: 20 mg via INTRAVENOUS

## 2019-01-05 MED ORDER — SODIUM CHLORIDE 0.9 % IV SOLN
Freq: Once | INTRAVENOUS | Status: AC
  Administered 2019-01-05: 11:00:00 via INTRAVENOUS
  Filled 2019-01-05: qty 250

## 2019-01-05 NOTE — Progress Notes (Signed)
Contacted insurance team and Arbie Cookey, PharmD on 01/04/19 regarding authorization for Onpro. Ok to switch to [employer]. Patient agrees to Onpro switch per telephone note on 12/30/18.  Udenyca orders have been discontinued, Day 3 cancelled, note to scheduling to cancel 01/07/19 injection appt. Onpro orders added.  Hardie Pulley, PharmD, BCPS, BCOP

## 2019-01-05 NOTE — Patient Instructions (Signed)
[place] Discharge Instructions for Patients Receiving Chemotherapy  Today you received the following chemotherapy agents Keytruda, Taxol, and Carboplatin.  To help prevent nausea and vomiting after your treatment, we encourage you to take your nausea medication as directed.  If you develop nausea and vomiting that is not controlled by your nausea medication, call the clinic.   BELOW ARE SYMPTOMS THAT SHOULD BE REPORTED IMMEDIATELY:  *FEVER GREATER THAN 100.5 F  *CHILLS WITH OR WITHOUT FEVER  NAUSEA AND VOMITING THAT IS NOT CONTROLLED WITH YOUR NAUSEA MEDICATION  *UNUSUAL SHORTNESS OF BREATH  *UNUSUAL BRUISING OR BLEEDING  TENDERNESS IN MOUTH AND THROAT WITH OR WITHOUT PRESENCE OF ULCERS  *URINARY PROBLEMS  *BOWEL PROBLEMS  UNUSUAL RASH Items with * indicate a potential emergency and should be followed up as soon as possible.  Feel free to call the clinic should you have any questions or concerns. The clinic phone number is [phone].  Please show the [place] at check-in to the Emergency Department and triage nurse.

## 2019-01-05 NOTE — Progress Notes (Signed)
Pt viewed neulasta onpro video. Pt questions were answered, and patient information pamphlet from neulasta box given to pt.

## 2019-01-05 NOTE — Progress Notes (Signed)
.  Neulasta Onpro Recommendation Note  Patient was contacted on 01/05/19 in regards to switching G-CSF therapy to Neulasta Onpro (pegfilgrastim). Patient was educated on the purpose of this proposed change in therapy due to COVID-19 pandemic. Patient was educated about Neulasta Onpro on-body injector and patient will be provided with an educational video while in infusion on their next scheduled date.   Based on the following circumstances, this patient would qualify for need for Neulasta Onpro (pegfilgrastim) therapy:  [x]  G-CSF is clinically necessary and warranted for this patient based on risk of neutropenia per NCCN guidelines.   [x]  Patient would benefit from decreased exposure to healthcare facility due to COVID-19 pandemic.  [x]  Patient medical oncologist preference and recommends for patient to receive Neulasta Onpro (pegfilgrastim).   Final Recommendation:   [x]  Patient agrees to change in therapy. Change to Neulasta Onpro.  []  Patient does not agree to change in therapy. No change to Neulasta Onpro at this time.    Thank You,  Wynona Neat  01/05/2019 10:46 AM

## 2019-01-06 ENCOUNTER — Telehealth: Payer: Self-pay | Admitting: Hematology

## 2019-01-06 NOTE — Telephone Encounter (Signed)
Scheduled appt per 4/29 los.

## 2019-01-07 ENCOUNTER — Ambulatory Visit: Payer: No Typology Code available for payment source

## 2019-01-12 ENCOUNTER — Other Ambulatory Visit: Payer: Self-pay

## 2019-01-12 ENCOUNTER — Inpatient Hospital Stay: Payer: No Typology Code available for payment source | Attending: Hematology

## 2019-01-12 VITALS — BP 101/61 | HR 86 | Temp 97.8°F | Resp 20

## 2019-01-12 DIAGNOSIS — C7951 Secondary malignant neoplasm of bone: Secondary | ICD-10-CM

## 2019-01-12 DIAGNOSIS — Z5189 Encounter for other specified aftercare: Secondary | ICD-10-CM | POA: Diagnosis not present

## 2019-01-12 DIAGNOSIS — Z5111 Encounter for antineoplastic chemotherapy: Secondary | ICD-10-CM | POA: Insufficient documentation

## 2019-01-12 DIAGNOSIS — C3412 Malignant neoplasm of upper lobe, left bronchus or lung: Secondary | ICD-10-CM | POA: Diagnosis present

## 2019-01-12 DIAGNOSIS — Z7189 Other specified counseling: Secondary | ICD-10-CM

## 2019-01-12 DIAGNOSIS — Z5112 Encounter for antineoplastic immunotherapy: Secondary | ICD-10-CM | POA: Diagnosis present

## 2019-01-12 DIAGNOSIS — C3492 Malignant neoplasm of unspecified part of left bronchus or lung: Secondary | ICD-10-CM

## 2019-01-12 MED ORDER — DENOSUMAB 120 MG/1.7ML ~~LOC~~ SOLN
120.0000 mg | Freq: Once | SUBCUTANEOUS | Status: AC
  Administered 2019-01-12: 120 mg via SUBCUTANEOUS

## 2019-01-12 MED ORDER — DENOSUMAB 120 MG/1.7ML ~~LOC~~ SOLN
SUBCUTANEOUS | Status: AC
  Filled 2019-01-12: qty 1.7

## 2019-01-12 NOTE — Patient Instructions (Signed)

## 2019-01-17 ENCOUNTER — Other Ambulatory Visit: Payer: Self-pay | Admitting: Hematology

## 2019-01-17 ENCOUNTER — Other Ambulatory Visit: Payer: Self-pay | Admitting: *Deleted

## 2019-01-17 MED ORDER — FENTANYL 25 MCG/HR TD PT72: 1.0000 | MEDICATED_PATCH | TRANSDERMAL | 0 refills | Status: DC

## 2019-01-17 NOTE — Telephone Encounter (Signed)
Called to request refill of Fentanyl patch

## 2019-01-25 ENCOUNTER — Other Ambulatory Visit: Payer: Self-pay

## 2019-01-25 ENCOUNTER — Inpatient Hospital Stay: Payer: No Typology Code available for payment source | Admitting: Nutrition

## 2019-01-25 NOTE — Progress Notes (Signed)
RD working remotely.  Contacted patient for nutrition follow up. He was not available but left a message with my name and phone number for call back.  Patient called back and I spoke to both patient and wife. Patient reports decreased appetite but he is trying to eat. Reports constipation is not resolved but is taking a laxative. He likes Strawberry Ensure Enlive but cannot always find it in the stores. Last weight documented was 131.8 pounds on April 29. Admits he does not drink enough fluids.  Nutrition diagnosis: Unintended weight loss stable through last available weight.  Intervention: Educated patient to increase calories and protein in small frequent meals and snacks. Recommend increase water intake and provided strategies to remind himself to drink water. Recommended continue Ensure Enlive. Answered questions and provided contact number for any further needs.  Monitoring, Evaluation, Goals: Increase calories and protein to minimize weight loss.  Next Visit: To be scheduled as needed.

## 2019-01-25 NOTE — Progress Notes (Signed)
HEMATOLOGY/ONCOLOGY CLINIC NOTE  Date of Service: 01/26/2019  Patient Care Team: [facility] as PCP - General Kyle Found Pearletha Forge, MD as PCP - Cardiology (Cardiology)  CHIEF COMPLAINTS/PURPOSE OF CONSULTATION:  Squamous cell lung cancer  HISTORY OF PRESENTING ILLNESS:    Kyle Foster is a wonderful 73 y.o. male who has been referred to [place] by Dr. Karie Kirks for evaluation and management of Lung Mass concerning for primary lung cancer.   he pt reports he has been having back pain on and off for about 3 months and was being worked up at the [facility] in [place] by his PCP . He reports it was being maanged as MSK pain and he was referred to PT but the symptoms got progressively worse. He presented to the ED on 10/14/18 with SOB and midsternal chest pain, neck pain, and back pain which had been constant for the last 2-3 weeks.   Of note prior to the patient's visit today, pt has had a CTA Chest completed on 10/14/18 with results revealing 5.6 cm left upper lobe lesion with chest wall and thoracic spine invasion, possible epidural extension of tumor. 2. 2.7 cm left hilar mass occluding the left lower lobe pulmonary artery branch and probably attenuating or occluding the inferior left pulmonary vein. 3. Left lower lobe pulmonary nodules possibly metastatic. 4. Negative for acute PE or thoracic aortic dissection. 5. Coronary and Aortic Atherosclerosis.  Most recent lab results (10/15/18) of CBC is as follows: all values are WNL except for RBC at 3.76, [name], [name], Glucose at 111.  He subsequently had an MRI of the T spine which showed Large cavitary mass arising in the superior aspect of the left hilum and extending into the posteromedial aspect of the left upper lobe invading the left side of the T5 and T6 vertebra and extending into the spinal canal with a slight mass effect upon the spinal cord. There is a slight pathologic compression fracture of the T6 vertebral body. 2.  Probable small metastasis in the T8 vertebral body. 3. Metastatic pulmonary nodules at the left lung base posterolaterally. 4. The mass destroys the posterior aspects of the left fifth and sixth ribs.  Patient has had a h/o localized bladder cancer s/p TURBT in 2009. 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  Interval History:   Kyle Foster returns today for management, evaluation, and C4 treatment of his Squamous Cell Carcinoma Lung Cancer. The patient's last visit with [place] was on 01/05/19. The pt reports that he is doing well overall.  The pt reports that he usually feels a little worse in general the week following chemotherapy, denying nausea but endorsing some fatigue and loss of appetite. He notes that this improves in the second and third week. He denies changes in breathing, leg swelling, or new back pain. He notes that his pain is better controlled. The pt denies developing any other concerns since our last visit. He notes that he is using Senna S three times a day and Miralax and is moving his bowels with fair regularity.  Lab results today (01/26/19) of CBC w/diff and CMP is as follows: all values are WNL except for WBC at 10.8k, RBC at 3.24, HGB at 10.3, HCT at 32.3, RDW at 16.9, PLT at 140k, ANC at 9.3k, [name], [name], Albumin at 3.2. 01/26/19 Magnesium at 1.6  On review  of systems, pt reports stable energy levels overall, stable appetite, stable weight, and denies changes in breathing, leg swelling, SOB, concerns for infections, diarrhea, nausea, vomiting, and any other symptoms.    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/2009   "squamous cell cancer attached to it; took the gland out"   History of kidney stones    Hyperlipidemia    Hypertension    Myocardial infarction ([place]) 06/2008    Recurrent upper respiratory infection (URI)    09/01/11 saw PCP - Kathryne Eriksson , antibiotic  and prednisone    Skin cancer    "cut & burned off arms, hands, face, neck" (06/14/2018)    SURGICAL HISTORY: Past Surgical History:  Procedure Laterality Date   CATARACT EXTRACTION W/ INTRAOCULAR LENS IMPLANT Right 12/2007   CORONARY ANGIOPLASTY WITH STENT PLACEMENT  06/2008   "3 stents" (06/14/2018)   CYSTOSCOPY W/ RETROGRADES  09/22/2011   Procedure: CYSTOSCOPY WITH RETROGRADE PYELOGRAM;  Surgeon: Bernestine Amass, MD;  Location: [facility];  Service: Urology;  Laterality: Left;  Cystoscopy left Retrograde Pyelogram      (c-arm)    CYSTOSCOPY WITH BIOPSY  09/22/2011   Procedure: CYSTOSCOPY WITH BIOPSY;  Surgeon: Bernestine Amass, MD;  Location: [facility];  Service: Urology;  Laterality: N/A;   Biopsy   EXCISIONAL HEMORRHOIDECTOMY  ~ 2006   EYE SURGERY Left 04/2011   "reconstruction; gold weight in eye lid " (06/14/2018)   FOOT NEUROMA SURGERY Left    INGUINAL HERNIA REPAIR Right 02/2018   IR IMAGING GUIDED PORT INSERTION  11/23/2018   NM MYOCAR PERF WALL MOTION  07/11/2008   MILD ISCHEMIA IN THE BASL INFERIOR, MID INFERIOR & APICAL INFERIOR REGIONS   SALIVARY GLAND SURGERY Left 04/2011   "squamous cell cancer attached to it; took the gland out"   SKIN CANCER EXCISION     "arms, hands, face, neck" (06/14/2018)   TRANSURETHRAL RESECTION OF BLADDER TUMOR WITH GYRUS (TURBT-GYRUS)  2010    SOCIAL HISTORY: Social History   Socioeconomic History   Marital status: Married    Spouse name: Not on file   Number of children: Not on file   Years of education: Not on file   Highest education level: Not on file  Occupational History   Not on file  Social Needs   Financial resource strain: Not on file   Food insecurity:    Worry: Not on file    Inability: Not on file   Transportation needs:    Medical: Not on file    Non-medical: Not on file  Tobacco Use   Smoking status: Current  Every Day Smoker    Packs/day: 0.50    Years: 50.00    Pack years: 25.00    Types: Cigarettes   Smokeless tobacco: Never Used  Substance and Sexual Activity   Alcohol use: Not Currently   Drug use: Not Currently   Sexual activity: Not on file  Lifestyle   Physical activity:    Days per week: Not on file    Minutes per session: Not on file   Stress: Not on file  Relationships   Social connections:    Talks on phone: Not on file    Gets together: Not on file    Attends religious service: Not on file    Active member of club or organization: Not on file    Attends  meetings of clubs or organizations: Not on file    Relationship status: Not on file   Intimate partner violence:    Fear of current or ex partner: Not on file    Emotionally abused: Not on file    Physically abused: Not on file    Forced sexual activity: Not on file  Other Topics Concern   Not on file  Social History Narrative   Not on file    FAMILY HISTORY: Family History  Problem Relation Age of Onset   Heart attack Mother 60   Cancer Mother    Stroke Father 13   Brain cancer Brother     ALLERGIES:  is allergic to codeine.  MEDICATIONS:  Current Outpatient Medications  Medication Sig Dispense Refill   albuterol (VENTOLIN HFA) 108 (90 Base) MCG/ACT inhaler Inhale 1-2 puffs into the lungs every 6 (six) hours as needed for wheezing or shortness of breath. 1 Inhaler 2   atorvastatin (LIPITOR) 80 MG tablet TAKE 1 TABLET BY MOUTH EVERY DAY AT 6PM **NEED OFFICE VISIT** 30 tablet 2   calcium carbonate (TUMS - DOSED IN MG ELEMENTAL CALCIUM) 500 MG chewable tablet Chew 1 tablet (200 mg of elemental calcium total) by mouth 3 (three) times daily with meals. 30 tablet 3   Cholecalciferol (VITAMIN D) 2000 UNITS tablet Take 2,000 Units by mouth 2 (two) times daily.     clopidogrel (PLAVIX) 75 MG tablet TAKE 1 TABLET BY MOUTH DAILY **NEED OFFICE VISIT** 30 tablet 2   dexamethasone (DECADRON) 2 MG  tablet Take 1 tablet (2 mg total) by mouth daily with breakfast. 10 tablet 0   dexamethasone (DECADRON) 4 MG tablet Take 2 tablets by mouth once a day on the day after chemotherapy and then take 2 tablets two times a day for 2 days. Take with food. 30 tablet 1   fentaNYL (DURAGESIC) 25 MCG/HR Place 1 patch onto the skin every 3 (three) days. 10 patch 0   gabapentin (NEURONTIN) 300 MG capsule Take 1 capsule (300 mg total) by mouth 3 (three) times daily. 90 capsule 1   Hypromellose (ARTIFICIAL TEARS OP) Place 1 drop into both eyes at bedtime.      lidocaine-prilocaine (EMLA) cream Apply 1 application topically as needed. 30 g 1   LORazepam (ATIVAN) 0.5 MG tablet Take 1 tablet (0.5 mg total) by mouth every 6 (six) hours as needed (Nausea or vomiting). 30 tablet 0   magnesium oxide (MAG-OX) 400 (241.3 Mg) MG tablet Take 1 tablet (400 mg total) by mouth daily. 30 tablet 2   metoprolol tartrate (LOPRESSOR) 25 MG tablet Take 1 tablet (25 mg total) by mouth 2 (two) times daily. Please make annual appt for future refills.  Thank you 180 tablet 0   morphine (MSIR) 15 MG tablet Take 1-2 tablets (15-30 mg total) by mouth every 4 (four) hours as needed for moderate pain or severe pain. 120 tablet 0   ondansetron (ZOFRAN) 8 MG tablet Take 1 tablet (8 mg total) by mouth every 8 (eight) hours as needed for nausea or vomiting. 30 tablet 1   pantoprazole (PROTONIX) 40 MG tablet TAKE 1 TABLET BY MOUTH EVERY DAY *MUST KEEP OV* 90 tablet 0   prochlorperazine (COMPAZINE) 10 MG tablet Take 1 tablet (10 mg total) by mouth every 6 (six) hours as needed for nausea or vomiting. 30 tablet 1   No current facility-administered medications for this visit.     REVIEW OF SYSTEMS:    A 10+ POINT REVIEW  OF SYSTEMS WAS OBTAINED including neurology, dermatology, psychiatry, cardiac, respiratory, lymph, extremities, GI, GU, Musculoskeletal, constitutional, breasts, reproductive, HEENT.  All pertinent positives are noted in  the HPI.  All others are negative.    PHYSICAL EXAMINATION: ECOG PERFORMANCE STATUS: 2 - Symptomatic, <50% confined to bed  Vitals:   01/26/19 1010  BP: 103/60  Pulse: 80  Resp: 18  Temp: 97.8 F (36.6 C)  SpO2: 97%   Filed Weights   01/26/19 1010  Weight: 129 lb 9.6 oz (58.8 kg)   .Body mass index is 18.08 kg/m.  GENERAL:alert, in no acute distress and comfortable SKIN: no acute rashes, no significant lesions EYES: conjunctiva are pink and non-injected, sclera anicteric OROPHARYNX: MMM, no exudates, no oropharyngeal erythema or ulceration NECK: supple, no JVD LYMPH:  no palpable lymphadenopathy in the cervical, axillary or inguinal regions LUNGS: few scattered rhonchi, no rales HEART: regular rate & rhythm ABDOMEN:  normoactive bowel sounds , non tender, not distended. No palpable hepatosplenomegaly.  Extremity: no pedal edema PSYCH: alert & oriented x 3 with fluent speech NEURO: no focal motor/sensory deficits   LABORATORY DATA:  I have reviewed the data as listed  . CBC Latest Ref Rng & Units 01/26/2019 01/05/2019 12/15/2018  WBC 4.0 - 10.5 K/uL 10.8(H) 11.3(H) 20.7(H)  Hemoglobin 13.0 - 17.0 g/dL 10.3(L) 10.6(L) 10.9(L)  Hematocrit 39.0 - 52.0 % 32.3(L) 33.7(L) 33.9(L)  Platelets 150 - 400 K/uL 140(L) 216 217    . CMP Latest Ref Rng & Units 01/26/2019 01/05/2019 12/15/2018  Glucose 70 - 99 mg/dL 90 82 108(H)  BUN 8 - 23 mg/dL _0 Creatinine 0.61 - 1.24 mg/dL 0.81 0.74 0.80  Sodium 135 - 145 mmol/L 139 139 140  Potassium 3.5 - 5.1 mmol/L 4.4 4.3 4.1  Chloride 98 - 111 mmol/L 106 107 106  CO2 22 - 32 mmol/L _1 Calcium 8.9 - 10.3 mg/dL 8.9 8.4(L) 8.8(L)  Total Protein 6.5 - 8.1 g/dL 6.2(L) 6.3(L) 6.2(L)  Total Bilirubin 0.3 - 1.2 mg/dL 0.6 0.3 0.5  Alkaline Phos 38 - 126 U/L 90 96 93  AST 15 - 41 U/L 15 13(L) 12(L)  ALT 0 - 44 U/L _2 RADIOGRAPHIC STUDIES: I have personally reviewed the radiological images as listed and agreed with  the findings in the report. No results found.  ASSESSMENT & PLAN:   73 y.o. male with  1. 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 10/19/18 Lung biopsy revealed Squamous Cell carcinoma S/p Palliative RT to LUL lung mass with 30Gy in 10 fractions between 10/22/18 and 11/03/18, due to impending cord compression  10/19/18 PDL-1 status report found 30% PDL-1 tumor proportion  11/06/18 MRI Brain revealed No intracranial metastatic disease identified. 2. Mild chronic microvascular ischemic changes and volume loss of the brain.  11/10/18 PET/CT revealed Locally advanced left lung mass, as detailed previously. 2. Left perihilar direct extension versus nodal metastasis. 3. No findings of hypermetabolic distant metastasis. 4. No findings of extrathoracic hypermetabolic metastasis.  2. 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 Smoker 4. 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  Has regularly followed  up with ENT Dr. Fenton Malling 6. Cancer related pain  PLAN -Discussed pt labwork today, 01/26/19; nutritional status improving, other blood counts and chemistries are otherwise stable. Magnesium slightly low at 1.6 -Begin PO Magnesium replacement and encouraged pt to consume more mixed nuts -The pt has no prohibitive toxicities from continuing C4 Carboplatin AUC of 5, 141m/m2 Taxol, and Pembrolizumab with G-CSF support at this time.  -Will order repeat imaging after completing C4 for evaluation of treatment efficacy and considering further treatment planning -Pt does not have ease of access to PCP at this time, and PFTs are not occurring presently due to [place]andemic. Will order  inhaler. -Pursuing Carboplatin and Taxol once every 3 weeks, up to 4-6 cycles, combined with immunotherapy. Followed by maintenance immunotherapy.  -Continue Fentanyl patch to 260m/hr and continue short acting MS IR 1-2 pills every 4-6 hours as needed to optimize mx of cancer related pain -Continue Senna S two capsules at night, and Miralax once a day, back off if diarrhea develops -Xgeva every 4 weeks to reduce risk of fracture -Avoid pushing, pulling, lifting and bending  -Encouraged pt to walk on level ground, to continue eating well and use Boost or Ensure to supplement meals -Continue 2000 units Vitamin D BID -Continue follow up with our nutritional therapist BaErnestene Kielnd discussed the importance of maintaining food consumption -Continue with 2 high protein Boosts each day as meal supplement, and eating well in general -Recommended that the pt continue to eat well, drink at least 48-64 oz of water each day, and walk 20-30 minutes each day.  -Advised that pt wear a mask while in public and avoid going into public as much as possible -Will see the pt back in 3 weeks   Ct Chest/ABd in 2 weeks Plz schedule C5 of chemotherapy as ordered with labs and MD visit in 3 weeks -continue Xgeva q4 weeks -   All of the patients questions were answered with apparent satisfaction. The patient knows to call the clinic with any problems, questions or concerns.  The total time spent in the appt was 20 minutes and more than 50% was on counseling and direct patient cares.    GaSullivan LoneD MS AAHIVMS SCVibra Hospital Of BoiseTBhc Alhambra Hospitalematology/Oncology Physician [facility]ALP[facility](Office):       [phone] cell):  [phone]):           [phone]/20/2020 10:54 AM  I, ScBaldwin Jamaicaam acting as a scribe for Dr. GaSullivan Lone  .I have reviewed the above documentation for accuracy and completeness, and I agree with the above. .GBrunetta GeneraD

## 2019-01-26 ENCOUNTER — Inpatient Hospital Stay: Payer: No Typology Code available for payment source

## 2019-01-26 ENCOUNTER — Telehealth: Payer: Self-pay | Admitting: Hematology

## 2019-01-26 ENCOUNTER — Other Ambulatory Visit: Payer: Self-pay

## 2019-01-26 ENCOUNTER — Inpatient Hospital Stay (HOSPITAL_BASED_OUTPATIENT_CLINIC_OR_DEPARTMENT_OTHER): Payer: No Typology Code available for payment source | Admitting: Hematology

## 2019-01-26 VITALS — BP 103/60 | HR 80 | Temp 97.8°F | Resp 18 | Ht 71.0 in | Wt 129.6 lb

## 2019-01-26 DIAGNOSIS — C7951 Secondary malignant neoplasm of bone: Secondary | ICD-10-CM

## 2019-01-26 DIAGNOSIS — R630 Anorexia: Secondary | ICD-10-CM | POA: Diagnosis not present

## 2019-01-26 DIAGNOSIS — Z5111 Encounter for antineoplastic chemotherapy: Secondary | ICD-10-CM

## 2019-01-26 DIAGNOSIS — R5383 Other fatigue: Secondary | ICD-10-CM

## 2019-01-26 DIAGNOSIS — Z7189 Other specified counseling: Secondary | ICD-10-CM

## 2019-01-26 DIAGNOSIS — C3412 Malignant neoplasm of upper lobe, left bronchus or lung: Secondary | ICD-10-CM

## 2019-01-26 DIAGNOSIS — C3492 Malignant neoplasm of unspecified part of left bronchus or lung: Secondary | ICD-10-CM

## 2019-01-26 DIAGNOSIS — Z720 Tobacco use: Secondary | ICD-10-CM

## 2019-01-26 DIAGNOSIS — Z5112 Encounter for antineoplastic immunotherapy: Secondary | ICD-10-CM | POA: Diagnosis not present

## 2019-01-26 DIAGNOSIS — G893 Neoplasm related pain (acute) (chronic): Secondary | ICD-10-CM | POA: Diagnosis not present

## 2019-01-26 DIAGNOSIS — C799 Secondary malignant neoplasm of unspecified site: Secondary | ICD-10-CM

## 2019-01-26 LAB — CBC WITH DIFFERENTIAL/PLATELET
Abs Immature Granulocytes: 0.05 10*3/uL (ref 0.00–0.07)
Basophils Absolute: 0 10*3/uL (ref 0.0–0.1)
Basophils Relative: 0 %
Eosinophils Absolute: 0 10*3/uL (ref 0.0–0.5)
Eosinophils Relative: 0 %
HCT: 32.3 % — ABNORMAL LOW (ref 39.0–52.0)
Hemoglobin: 10.3 g/dL — ABNORMAL LOW (ref 13.0–17.0)
Immature Granulocytes: 1 %
Lymphocytes Relative: 3 %
Lymphs Abs: 0.4 10*3/uL — ABNORMAL LOW (ref 0.7–4.0)
MCH: 31.8 pg (ref 26.0–34.0)
MCHC: 31.9 g/dL (ref 30.0–36.0)
MCV: 99.7 fL (ref 80.0–100.0)
Monocytes Absolute: 1 10*3/uL (ref 0.1–1.0)
Monocytes Relative: 9 %
Neutro Abs: 9.3 10*3/uL — ABNORMAL HIGH (ref 1.7–7.7)
Neutrophils Relative %: 87 %
Platelets: 140 10*3/uL — ABNORMAL LOW (ref 150–400)
RBC: 3.24 MIL/uL — ABNORMAL LOW (ref 4.22–5.81)
RDW: 16.9 % — ABNORMAL HIGH (ref 11.5–15.5)
WBC: 10.8 10*3/uL — ABNORMAL HIGH (ref 4.0–10.5)
nRBC: 0 % (ref 0.0–0.2)

## 2019-01-26 LAB — CMP (CANCER CENTER ONLY)
ALT: 14 U/L (ref 0–44)
AST: 15 U/L (ref 15–41)
Albumin: 3.2 g/dL — ABNORMAL LOW (ref 3.5–5.0)
Alkaline Phosphatase: 90 U/L (ref 38–126)
Anion gap: 7 (ref 5–15)
BUN: 20 mg/dL (ref 8–23)
CO2: 26 mmol/L (ref 22–32)
Calcium: 8.9 mg/dL (ref 8.9–10.3)
Chloride: 106 mmol/L (ref 98–111)
Creatinine: 0.81 mg/dL (ref 0.61–1.24)
GFR, Est AFR Am: >60 mL/min (ref >60)
GFR, Estimated: >60 mL/min (ref >60)
Glucose, Bld: 90 mg/dL (ref 70–99)
Potassium: 4.4 mmol/L (ref 3.5–5.1)
Sodium: 139 mmol/L (ref 135–145)
Total Bilirubin: 0.6 mg/dL (ref 0.3–1.2)
Total Protein: 6.2 g/dL — ABNORMAL LOW (ref 6.5–8.1)

## 2019-01-26 LAB — MAGNESIUM: Magnesium: 1.6 mg/dL — ABNORMAL LOW (ref 1.7–2.4)

## 2019-01-26 MED ORDER — HEPARIN SOD (PORK) LOCK FLUSH 100 UNIT/ML IV SOLN
500.0000 [IU] | Freq: Once | INTRAVENOUS | Status: AC | PRN
  Administered 2019-01-26: 500 [IU]
  Filled 2019-01-26: qty 5

## 2019-01-26 MED ORDER — FAMOTIDINE IN NACL 20-0.9 MG/50ML-% IV SOLN
20.0000 mg | Freq: Once | INTRAVENOUS | Status: AC
  Administered 2019-01-26: 20 mg via INTRAVENOUS

## 2019-01-26 MED ORDER — DIPHENHYDRAMINE HCL 50 MG/ML IJ SOLN
50.0000 mg | Freq: Once | INTRAMUSCULAR | Status: AC
  Administered 2019-01-26: 50 mg via INTRAVENOUS

## 2019-01-26 MED ORDER — FAMOTIDINE IN NACL 20-0.9 MG/50ML-% IV SOLN
INTRAVENOUS | Status: AC
  Filled 2019-01-26: qty 50

## 2019-01-26 MED ORDER — SODIUM CHLORIDE 0.9 % IV SOLN
150.0000 mg/m2 | Freq: Once | INTRAVENOUS | Status: AC
  Administered 2019-01-26: 258 mg via INTRAVENOUS
  Filled 2019-01-26: qty 43

## 2019-01-26 MED ORDER — PALONOSETRON HCL INJECTION 0.25 MG/5ML
0.2500 mg | Freq: Once | INTRAVENOUS | Status: AC
  Administered 2019-01-26: 0.25 mg via INTRAVENOUS

## 2019-01-26 MED ORDER — PALONOSETRON HCL INJECTION 0.25 MG/5ML
INTRAVENOUS | Status: AC
  Filled 2019-01-26: qty 5

## 2019-01-26 MED ORDER — SODIUM CHLORIDE 0.9 % IV SOLN
200.0000 mg | Freq: Once | INTRAVENOUS | Status: AC
  Administered 2019-01-26: 200 mg via INTRAVENOUS
  Filled 2019-01-26: qty 8

## 2019-01-26 MED ORDER — SODIUM CHLORIDE 0.9% FLUSH
10.0000 mL | INTRAVENOUS | Status: DC | PRN
  Administered 2019-01-26: 10 mL
  Filled 2019-01-26: qty 10

## 2019-01-26 MED ORDER — DIPHENHYDRAMINE HCL 50 MG/ML IJ SOLN
INTRAMUSCULAR | Status: AC
  Filled 2019-01-26: qty 1

## 2019-01-26 MED ORDER — SODIUM CHLORIDE 0.9 % IV SOLN
Freq: Once | INTRAVENOUS | Status: AC
  Administered 2019-01-26: 11:00:00 via INTRAVENOUS
  Filled 2019-01-26: qty 250

## 2019-01-26 MED ORDER — PEGFILGRASTIM 6 MG/0.6ML ~~LOC~~ PSKT
PREFILLED_SYRINGE | SUBCUTANEOUS | Status: AC
  Filled 2019-01-26: qty 0.6

## 2019-01-26 MED ORDER — SODIUM CHLORIDE 0.9 % IV SOLN
412.0000 mg | Freq: Once | INTRAVENOUS | Status: AC
  Administered 2019-01-26: 410 mg via INTRAVENOUS
  Filled 2019-01-26: qty 41

## 2019-01-26 MED ORDER — MAGNESIUM OXIDE 400 (241.3 MG) MG PO TABS: 400.0000 mg | ORAL_TABLET | Freq: Every day | ORAL | 2 refills | Status: DC

## 2019-01-26 MED ORDER — SODIUM CHLORIDE 0.9 % IV SOLN
Freq: Once | INTRAVENOUS | Status: AC
  Administered 2019-01-26: 12:00:00 via INTRAVENOUS
  Filled 2019-01-26: qty 5

## 2019-01-26 MED ORDER — PEGFILGRASTIM 6 MG/0.6ML ~~LOC~~ PSKT
6.0000 mg | PREFILLED_SYRINGE | Freq: Once | SUBCUTANEOUS | Status: AC
  Administered 2019-01-26: 6 mg via SUBCUTANEOUS

## 2019-01-26 MED ORDER — SODIUM CHLORIDE 0.9% FLUSH
10.0000 mL | INTRAVENOUS | Status: DC | PRN
  Administered 2019-01-26: 09:00:00 10 mL
  Filled 2019-01-26: qty 10

## 2019-01-26 NOTE — Patient Instructions (Addendum)
[place] Discharge Instructions for Patients Receiving Chemotherapy  Today you received the following chemotherapy agents Taxol, Carboplatin and other agents: Keytruda, Neulasta  To help prevent nausea and vomiting after your treatment, we encourage you to take your nausea medication as directed by your MD.   If you develop nausea and vomiting that is not controlled by your nausea medication, call the clinic.   BELOW ARE SYMPTOMS THAT SHOULD BE REPORTED IMMEDIATELY:  *FEVER GREATER THAN 100.5 F  *CHILLS WITH OR WITHOUT FEVER  NAUSEA AND VOMITING THAT IS NOT CONTROLLED WITH YOUR NAUSEA MEDICATION  *UNUSUAL SHORTNESS OF BREATH  *UNUSUAL BRUISING OR BLEEDING  TENDERNESS IN MOUTH AND THROAT WITH OR WITHOUT PRESENCE OF ULCERS  *URINARY PROBLEMS  *BOWEL PROBLEMS  UNUSUAL RASH Items with * indicate a potential emergency and should be followed up as soon as possible.  Feel free to call the clinic should you have any questions or concerns. The clinic phone number is [phone].  Please show the [place] at check-in to the Emergency Department and triage nurse.  Coronavirus (COVID-19) Are you at risk?  Are you at risk for the Coronavirus (COVID-19)?  To be considered HIGH RISK for Coronavirus (COVID-19), you have to meet the following criteria:  . Traveled to [place], [place], [place], [place] or [place]; or in the [place] to [place], [place], [place], or [place]; and have fever, cough, and shortness of breath within the last 2 weeks of travel OR . Been in close contact with a person diagnosed with COVID-19 within the last 2 weeks and have fever, cough, and shortness of breath . IF YOU DO NOT MEET THESE CRITERIA, YOU ARE CONSIDERED LOW RISK FOR COVID-19.  What to do if you are HIGH RISK for COVID-19?  Marland Kitchen If you are having a medical emergency, call 911. . Seek medical care right away. Before you go to a doctor's office, urgent care or  emergency department, call ahead and tell them about your recent travel, contact with someone diagnosed with COVID-19, and your symptoms. You should receive instructions from your physician's office regarding next steps of care.  . When you arrive at healthcare provider, tell the healthcare staff immediately you have returned from visiting [place], [place], [place], [place] or [place]; or traveled in the [place] to [place], [place], [place], or [place]; in the last two weeks or you have been in close contact with a person diagnosed with COVID-19 in the last 2 weeks.   . Tell the health care staff about your symptoms: fever, cough and shortness of breath. . After you have been seen by a medical provider, you will be either: o Tested for (COVID-19) and discharged home on quarantine except to seek medical care if symptoms worsen, and asked to  - Stay home and avoid contact with others until you get your results (4-5 days)  - Avoid travel on public transportation if possible (such as bus, train, or airplane) or o Sent to the Emergency Department by EMS for evaluation, COVID-19 testing, and possible admission depending on your condition and test results.  What to do if you are LOW RISK for COVID-19?  Reduce your risk of any infection by using the same precautions used for avoiding the common cold or flu:  Marland Kitchen Wash your hands often with soap and warm water for at least 20 seconds.  If soap and water are not readily available, use an alcohol-based hand sanitizer with at  least 60% alcohol.  . If coughing or sneezing, cover your mouth and nose by coughing or sneezing into the elbow areas of your shirt or coat, into a tissue or into your sleeve (not your hands). . Avoid shaking hands with others and consider head nods or verbal greetings only. . Avoid touching your eyes, nose, or mouth with unwashed hands.  . Avoid close contact with people who are sick. . Avoid places or events with large numbers  of people in one location, like concerts or sporting events. . Carefully consider travel plans you have or are making. . If you are planning any travel outside or inside the [place], visit the CDC's Travelers' Health webpage for the latest health notices. . If you have some symptoms but not all symptoms, continue to monitor at home and seek medical attention if your symptoms worsen. . If you are having a medical emergency, call 911.   [place] / e-Visit: [email or web address]         [facility]: [facility]: [phone]                   [facility]: [phone]

## 2019-01-26 NOTE — Telephone Encounter (Signed)
Per 5/20 los, appts already scheduled

## 2019-01-28 ENCOUNTER — Ambulatory Visit: Payer: No Typology Code available for payment source

## 2019-02-09 ENCOUNTER — Other Ambulatory Visit: Payer: Self-pay | Admitting: Hematology

## 2019-02-09 ENCOUNTER — Ambulatory Visit (HOSPITAL_COMMUNITY)
Admission: RE | Admit: 2019-02-09 | Discharge: 2019-02-09 | Disposition: A | Discharge status: Home / Self Care | Payer: Medicare Other | Source: Ambulatory Visit | Attending: Hematology | Admitting: Hematology

## 2019-02-09 ENCOUNTER — Other Ambulatory Visit: Payer: Self-pay

## 2019-02-09 ENCOUNTER — Inpatient Hospital Stay: Payer: No Typology Code available for payment source | Attending: Hematology

## 2019-02-09 VITALS — BP 107/61 | HR 86 | Temp 98.0°F | Resp 18

## 2019-02-09 DIAGNOSIS — Z7189 Other specified counseling: Secondary | ICD-10-CM

## 2019-02-09 DIAGNOSIS — C3412 Malignant neoplasm of upper lobe, left bronchus or lung: Secondary | ICD-10-CM

## 2019-02-09 DIAGNOSIS — C799 Secondary malignant neoplasm of unspecified site: Secondary | ICD-10-CM | POA: Insufficient documentation

## 2019-02-09 DIAGNOSIS — C7951 Secondary malignant neoplasm of bone: Secondary | ICD-10-CM

## 2019-02-09 DIAGNOSIS — D72828 Other elevated white blood cell count: Secondary | ICD-10-CM | POA: Diagnosis not present

## 2019-02-09 DIAGNOSIS — C3492 Malignant neoplasm of unspecified part of left bronchus or lung: Secondary | ICD-10-CM

## 2019-02-09 DIAGNOSIS — Z5189 Encounter for other specified aftercare: Secondary | ICD-10-CM | POA: Insufficient documentation

## 2019-02-09 DIAGNOSIS — Z5111 Encounter for antineoplastic chemotherapy: Secondary | ICD-10-CM | POA: Diagnosis present

## 2019-02-09 DIAGNOSIS — K7689 Other specified diseases of liver: Secondary | ICD-10-CM | POA: Diagnosis not present

## 2019-02-09 DIAGNOSIS — Z5112 Encounter for antineoplastic immunotherapy: Secondary | ICD-10-CM | POA: Diagnosis not present

## 2019-02-09 DIAGNOSIS — Z85118 Personal history of other malignant neoplasm of bronchus and lung: Secondary | ICD-10-CM | POA: Diagnosis not present

## 2019-02-09 MED ORDER — DENOSUMAB 120 MG/1.7ML ~~LOC~~ SOLN
SUBCUTANEOUS | Status: AC
  Filled 2019-02-09: qty 1.7

## 2019-02-09 MED ORDER — DENOSUMAB 120 MG/1.7ML ~~LOC~~ SOLN
120.0000 mg | Freq: Once | SUBCUTANEOUS | Status: AC
  Administered 2019-02-09: 120 mg via SUBCUTANEOUS

## 2019-02-09 MED ORDER — IOHEXOL 300 MG/ML  SOLN
100.0000 mL | Freq: Once | INTRAMUSCULAR | Status: AC | PRN
  Administered 2019-02-09: 100 mL via INTRAVENOUS

## 2019-02-09 MED ORDER — SODIUM CHLORIDE (PF) 0.9 % IJ SOLN
INTRAMUSCULAR | Status: AC
  Filled 2019-02-09: qty 50

## 2019-02-09 NOTE — Patient Instructions (Signed)

## 2019-02-14 ENCOUNTER — Other Ambulatory Visit: Payer: Self-pay | Admitting: *Deleted

## 2019-02-14 MED ORDER — FENTANYL 25 MCG/HR TD PT72: 1.0000 | MEDICATED_PATCH | TRANSDERMAL | 0 refills | Status: DC

## 2019-02-14 NOTE — Telephone Encounter (Signed)
Patient called. Requests refill of fentanyl, last patch will be used on Wednesday. Request sent to Dr. [name]. Patient also needs refill of Zofran. He takes every 8 hours for approximately 1 week following chemo so asking for increase in number of tablets from #30. Informed him that MD will receive this message.

## 2019-02-15 ENCOUNTER — Other Ambulatory Visit: Payer: Self-pay | Admitting: Cardiovascular Disease

## 2019-02-15 ENCOUNTER — Other Ambulatory Visit: Payer: Self-pay | Admitting: Hematology

## 2019-02-15 NOTE — Progress Notes (Signed)
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 Smoker 4. 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  Has regularly followed up with ENT Dr. Fenton Malling 6. Cancer related pain  PLAN -Discussed pt labwork today, 02/16/19; blood counts are stable, neutrophilia in setting of G-CSF support. Albumin improving now to 3.4. Magnesium normalized to 1.7. -Discussed the 02/09/19 CT Chest and Abdomen which revealed "Today's study demonstrates a positive response to therapy with slight regression of the primary lesion in the superior segment of the left lower lobe which again demonstrates direct invasion  of the adjacent posteromedial left chest wall and vertebral column, as detailed above. 2. However, there is a new hypovascular lesion in segment 7 of the liver. This is nonspecific and too small to characterize, but given the development compared to prior studies,  this is concerning for potential metastasis. This could be definitively characterized with MRI of the abdomen with and without IV gadolinium if clinically appropriate. 3. 5 mm nonobstructive calculus in the right renal collecting system. 4. Aortic atherosclerosis, in addition to 3 vessel coronary artery disease. Assessment for potential risk factor modification, dietary therapy or pharmacologic therapy may be warranted, if clinically Indicated. 5. There are calcifications of the aortic valve. Echocardiographic correlation for evaluation of potential valvular dysfunction may be warranted if clinically indicated. 6. Additional incidental findings, as above." -Discussed that I suspect that the liver finding is likely a cyst, as progression through treatment would be unexpected. Nevertheless, will monitor this finding over time with future scans. -Will order ECHO, which the pt does prefer. -Discussed that the pt has responded well after having completed 4 cycles, and discussed the potential benefits of pursuing up to 2 more cycles in conversation with his goals of care and potential side effects and quality of life considerations, in the setting of non-curative treatment. No cytopenias affecting tolerance thus far. Discussed that we could switch now to maintenance immunotherapy. The pt decided that he would like to pursue C5 today and then switch to maintenance immunotherapy after this, forgoing C6. -The pt has no prohibitive toxicities from continuing C5 Carboplatin AUC of 5, 146m/m2 Taxol, and Pembrolizumab with G-CSF support at this time.   -Pt does not have ease of access to PCP at this time, and PFTs are not occurring presently due to CInezpandemic. Will order inhaler. -Continue Fentanyl patch to 257m/hr and continue short acting MS IR 1-2 pills every 4-6 hours as needed to optimize mx of cancer related pain. -Continue Senna S two capsules at night, and Miralax once a day, back off if diarrhea  develops -Xgeva every 4 weeks to reduce risk of fracture -Avoid pushing, pulling, lifting and bending  -Encouraged pt to walk on level ground, to continue eating well and use Boost or Ensure to supplement meals -Continue 2000 units Vitamin D BID -Continue follow up with our nutritional therapist BaErnestene Kielnd discussed the importance of maintaining food consumption -Continue with 2 high protein Boosts each day as meal supplement, and eating well in general -Recommended that the pt continue to eat well, drink at least 48-64 oz of water each day, and walk 20-30 minutes each day.  -Advised that pt wear a mask while in public and avoid going into public as much as possible -Will see the pt back in 3 weeks   -Plz schedule to start maintenance Pembrolizumab only in 3 weeks with labs and MD visit   All of the patients questions were answered with apparent satisfaction. The patient knows to call the clinic with any problems, questions or concerns.  The total time spent in the appt was 30 minutes and more than 50% was on counseling and direct patient cares.    GaSullivan LoneD MS AAHIVMS SCBlack River Mem HsptlTSt. Alexius Hospital - [facility] Campusematology/Oncology Physician CoHampton Va Medical Center(Office):       [phone] cell):  [phone]):           [phone] 58566/06/2019 10:51 AM  I, ScBaldwin Jamaicaam acting as a scribe for Dr. GaSullivan Lone  .I have reviewed the above  documentation for accuracy and completeness, and I agree with the above. Brunetta Genera MD

## 2019-02-16 ENCOUNTER — Other Ambulatory Visit: Payer: Self-pay

## 2019-02-16 ENCOUNTER — Inpatient Hospital Stay: Payer: No Typology Code available for payment source

## 2019-02-16 ENCOUNTER — Inpatient Hospital Stay (HOSPITAL_BASED_OUTPATIENT_CLINIC_OR_DEPARTMENT_OTHER): Payer: No Typology Code available for payment source | Admitting: Hematology

## 2019-02-16 VITALS — BP 111/53 | HR 80 | Temp 98.7°F | Resp 18 | Ht 71.0 in | Wt 128.1 lb

## 2019-02-16 DIAGNOSIS — C3412 Malignant neoplasm of upper lobe, left bronchus or lung: Secondary | ICD-10-CM

## 2019-02-16 DIAGNOSIS — C7951 Secondary malignant neoplasm of bone: Secondary | ICD-10-CM

## 2019-02-16 DIAGNOSIS — Z5111 Encounter for antineoplastic chemotherapy: Secondary | ICD-10-CM

## 2019-02-16 DIAGNOSIS — D72828 Other elevated white blood cell count: Secondary | ICD-10-CM

## 2019-02-16 DIAGNOSIS — Z720 Tobacco use: Secondary | ICD-10-CM

## 2019-02-16 DIAGNOSIS — C3492 Malignant neoplasm of unspecified part of left bronchus or lung: Secondary | ICD-10-CM

## 2019-02-16 DIAGNOSIS — K769 Liver disease, unspecified: Secondary | ICD-10-CM | POA: Diagnosis not present

## 2019-02-16 DIAGNOSIS — G893 Neoplasm related pain (acute) (chronic): Secondary | ICD-10-CM | POA: Diagnosis not present

## 2019-02-16 DIAGNOSIS — C799 Secondary malignant neoplasm of unspecified site: Secondary | ICD-10-CM

## 2019-02-16 DIAGNOSIS — R0602 Shortness of breath: Secondary | ICD-10-CM

## 2019-02-16 DIAGNOSIS — Z7189 Other specified counseling: Secondary | ICD-10-CM

## 2019-02-16 DIAGNOSIS — Z5112 Encounter for antineoplastic immunotherapy: Secondary | ICD-10-CM | POA: Diagnosis not present

## 2019-02-16 LAB — CMP (CANCER CENTER ONLY)
ALT: 8 U/L (ref 0–44)
AST: 13 U/L — ABNORMAL LOW (ref 15–41)
Albumin: 3.4 g/dL — ABNORMAL LOW (ref 3.5–5.0)
Alkaline Phosphatase: 94 U/L (ref 38–126)
Anion gap: 7 (ref 5–15)
BUN: 16 mg/dL (ref 8–23)
CO2: 25 mmol/L (ref 22–32)
Calcium: 8.5 mg/dL — ABNORMAL LOW (ref 8.9–10.3)
Chloride: 109 mmol/L (ref 98–111)
Creatinine: 0.77 mg/dL (ref 0.61–1.24)
GFR, Est AFR Am: >60 mL/min (ref >60)
GFR, Estimated: >60 mL/min (ref >60)
Glucose, Bld: 90 mg/dL (ref 70–99)
Potassium: 4.2 mmol/L (ref 3.5–5.1)
Sodium: 141 mmol/L (ref 135–145)
Total Bilirubin: 0.5 mg/dL (ref 0.3–1.2)
Total Protein: 6.1 g/dL — ABNORMAL LOW (ref 6.5–8.1)

## 2019-02-16 LAB — CBC WITH DIFFERENTIAL/PLATELET
Abs Immature Granulocytes: 0.06 10*3/uL (ref 0.00–0.07)
Basophils Absolute: 0 10*3/uL (ref 0.0–0.1)
Basophils Relative: 0 %
Eosinophils Absolute: 0 10*3/uL (ref 0.0–0.5)
Eosinophils Relative: 0 %
HCT: 33.5 % — ABNORMAL LOW (ref 39.0–52.0)
Hemoglobin: 10.6 g/dL — ABNORMAL LOW (ref 13.0–17.0)
Immature Granulocytes: 0 %
Lymphocytes Relative: 4 %
Lymphs Abs: 0.5 10*3/uL — ABNORMAL LOW (ref 0.7–4.0)
MCH: 33.1 pg (ref 26.0–34.0)
MCHC: 31.6 g/dL (ref 30.0–36.0)
MCV: 104.7 fL — ABNORMAL HIGH (ref 80.0–100.0)
Monocytes Absolute: 0.8 10*3/uL (ref 0.1–1.0)
Monocytes Relative: 6 %
Neutro Abs: 12.5 10*3/uL — ABNORMAL HIGH (ref 1.7–7.7)
Neutrophils Relative %: 90 %
Platelets: 155 10*3/uL (ref 150–400)
RBC: 3.2 MIL/uL — ABNORMAL LOW (ref 4.22–5.81)
RDW: 18 % — ABNORMAL HIGH (ref 11.5–15.5)
WBC: 13.9 10*3/uL — ABNORMAL HIGH (ref 4.0–10.5)
nRBC: 0 % (ref 0.0–0.2)

## 2019-02-16 LAB — MAGNESIUM: Magnesium: 1.7 mg/dL (ref 1.7–2.4)

## 2019-02-16 MED ORDER — FAMOTIDINE IN NACL 20-0.9 MG/50ML-% IV SOLN
20.0000 mg | Freq: Once | INTRAVENOUS | Status: AC
  Administered 2019-02-16: 11:00:00 20 mg via INTRAVENOUS

## 2019-02-16 MED ORDER — SODIUM CHLORIDE 0.9 % IV SOLN
Freq: Once | INTRAVENOUS | Status: AC
  Administered 2019-02-16: 12:00:00 via INTRAVENOUS
  Filled 2019-02-16: qty 5

## 2019-02-16 MED ORDER — PEGFILGRASTIM 6 MG/0.6ML ~~LOC~~ PSKT
6.0000 mg | PREFILLED_SYRINGE | Freq: Once | SUBCUTANEOUS | Status: AC
  Administered 2019-02-16: 17:00:00 6 mg via SUBCUTANEOUS

## 2019-02-16 MED ORDER — SODIUM CHLORIDE 0.9 % IV SOLN
150.0000 mg/m2 | Freq: Once | INTRAVENOUS | Status: AC
  Administered 2019-02-16: 13:00:00 258 mg via INTRAVENOUS
  Filled 2019-02-16: qty 43

## 2019-02-16 MED ORDER — SODIUM CHLORIDE 0.9 % IV SOLN
412.0000 mg | Freq: Once | INTRAVENOUS | Status: AC
  Administered 2019-02-16: 410 mg via INTRAVENOUS
  Filled 2019-02-16: qty 41

## 2019-02-16 MED ORDER — DIPHENHYDRAMINE HCL 50 MG/ML IJ SOLN
INTRAMUSCULAR | Status: AC
  Filled 2019-02-16: qty 1

## 2019-02-16 MED ORDER — HEPARIN SOD (PORK) LOCK FLUSH 100 UNIT/ML IV SOLN
500.0000 [IU] | Freq: Once | INTRAVENOUS | Status: AC | PRN
  Administered 2019-02-16: 500 [IU]
  Filled 2019-02-16: qty 5

## 2019-02-16 MED ORDER — PALONOSETRON HCL INJECTION 0.25 MG/5ML
0.2500 mg | Freq: Once | INTRAVENOUS | Status: AC
  Administered 2019-02-16: 0.25 mg via INTRAVENOUS

## 2019-02-16 MED ORDER — SODIUM CHLORIDE 0.9% FLUSH
10.0000 mL | INTRAVENOUS | Status: DC | PRN
  Administered 2019-02-16: 10 mL
  Filled 2019-02-16: qty 10

## 2019-02-16 MED ORDER — SODIUM CHLORIDE 0.9 % IV SOLN
Freq: Once | INTRAVENOUS | Status: AC
  Administered 2019-02-16: 11:00:00 via INTRAVENOUS
  Filled 2019-02-16: qty 250

## 2019-02-16 MED ORDER — DIPHENHYDRAMINE HCL 50 MG/ML IJ SOLN
50.0000 mg | Freq: Once | INTRAMUSCULAR | Status: AC
  Administered 2019-02-16: 11:00:00 50 mg via INTRAVENOUS

## 2019-02-16 MED ORDER — SODIUM CHLORIDE 0.9 % IV SOLN
200.0000 mg | Freq: Once | INTRAVENOUS | Status: AC
  Administered 2019-02-16: 12:00:00 200 mg via INTRAVENOUS
  Filled 2019-02-16: qty 8

## 2019-02-16 MED ORDER — MORPHINE SULFATE 15 MG PO TABS: 15.0000 mg | ORAL_TABLET | ORAL | 0 refills | Status: DC | PRN

## 2019-02-16 MED ORDER — FAMOTIDINE IN NACL 20-0.9 MG/50ML-% IV SOLN
INTRAVENOUS | Status: AC
  Filled 2019-02-16: qty 50

## 2019-02-16 MED ORDER — PALONOSETRON HCL INJECTION 0.25 MG/5ML
INTRAVENOUS | Status: AC
  Filled 2019-02-16: qty 5

## 2019-02-16 MED ORDER — PEGFILGRASTIM 6 MG/0.6ML ~~LOC~~ PSKT
PREFILLED_SYRINGE | SUBCUTANEOUS | Status: AC
  Filled 2019-02-16: qty 0.6

## 2019-02-16 NOTE — Patient Instructions (Signed)
[facility] Discharge Instructions for Patients Receiving Chemotherapy  Today you received the following chemotherapy agents Taxol, Carboplatin and other agents: Keytruda, Neulasta  To help prevent nausea and vomiting after your treatment, we encourage you to take your nausea medication as directed by your MD.   If you develop nausea and vomiting that is not controlled by your nausea medication, call the clinic.   BELOW ARE SYMPTOMS THAT SHOULD BE REPORTED IMMEDIATELY:  *FEVER GREATER THAN 100.5 F  *CHILLS WITH OR WITHOUT FEVER  NAUSEA AND VOMITING THAT IS NOT CONTROLLED WITH YOUR NAUSEA MEDICATION  *UNUSUAL SHORTNESS OF BREATH  *UNUSUAL BRUISING OR BLEEDING  TENDERNESS IN MOUTH AND THROAT WITH OR WITHOUT PRESENCE OF ULCERS  *URINARY PROBLEMS  *BOWEL PROBLEMS  UNUSUAL RASH Items with * indicate a potential emergency and should be followed up as soon as possible.  Feel free to call the clinic should you have any questions or concerns. The clinic phone number is [phone].  Please show the [facility] at check-in to the Emergency Department and triage nurse.  Coronavirus (COVID-19) Are you at risk?  Are you at risk for the Coronavirus (COVID-19)?  To be considered HIGH RISK for Coronavirus (COVID-19), you have to meet the following criteria:  . Traveled to [place], [place], [place], [place] or [place]; or in the [place] to [place], [place], [place], or [place]; and have fever, cough, and shortness of breath within the last 2 weeks of travel OR . Been in close contact with a person diagnosed with COVID-19 within the last 2 weeks and have fever, cough, and shortness of breath . IF YOU DO NOT MEET THESE CRITERIA, YOU ARE CONSIDERED LOW RISK FOR COVID-19.  What to do if you are HIGH RISK for COVID-19?  Marland Kitchen If you are having a medical emergency, call 911. . Seek medical care right away. Before you go to a doctor's office, urgent care or  emergency department, call ahead and tell them about your recent travel, contact with someone diagnosed with COVID-19, and your symptoms. You should receive instructions from your physician's office regarding next steps of care.  . When you arrive at healthcare provider, tell the healthcare staff immediately you have returned from visiting [place], [place], [place], [place] or [place]; or traveled in the [place] to [place], [place], [place], or [place]; in the last two weeks or you have been in close contact with a person diagnosed with COVID-19 in the last 2 weeks.   . Tell the health care staff about your symptoms: fever, cough and shortness of breath. . After you have been seen by a medical provider, you will be either: o Tested for (COVID-19) and discharged home on quarantine except to seek medical care if symptoms worsen, and asked to  - Stay home and avoid contact with others until you get your results (4-5 days)  - Avoid travel on public transportation if possible (such as bus, train, or airplane) or o Sent to the Emergency Department by EMS for evaluation, COVID-19 testing, and possible admission depending on your condition and test results.  What to do if you are LOW RISK for COVID-19?  Reduce your risk of any infection by using the same precautions used for avoiding the common cold or flu:  Marland Kitchen Wash your hands often with soap and warm water for at least 20 seconds.  If soap and water are not readily available, use an alcohol-based hand sanitizer with at  least 60% alcohol.  . If coughing or sneezing, cover your mouth and nose by coughing or sneezing into the elbow areas of your shirt or coat, into a tissue or into your sleeve (not your hands). . Avoid shaking hands with others and consider head nods or verbal greetings only. . Avoid touching your eyes, nose, or mouth with unwashed hands.  . Avoid close contact with people who are sick. . Avoid places or events with large numbers  of people in one location, like concerts or sporting events. . Carefully consider travel plans you have or are making. . If you are planning any travel outside or inside the [place], visit the CDC's Travelers' Health webpage for the latest health notices. . If you have some symptoms but not all symptoms, continue to monitor at home and seek medical attention if your symptoms worsen. . If you are having a medical emergency, call 911.   [place] / e-Visit: [email or web address]         [facility]: [facility]: [phone]                   [facility]: [phone]

## 2019-02-16 NOTE — Patient Instructions (Signed)

## 2019-02-17 ENCOUNTER — Telehealth: Payer: Self-pay | Admitting: Hematology

## 2019-02-17 NOTE — Telephone Encounter (Signed)
Scheduled appt per 6/10 los. Was not able to reach the patient, a calendar will be mailed out. °

## 2019-02-28 ENCOUNTER — Other Ambulatory Visit: Payer: Self-pay | Admitting: Cardiovascular Disease

## 2019-03-08 ENCOUNTER — Other Ambulatory Visit: Payer: Self-pay

## 2019-03-08 ENCOUNTER — Ambulatory Visit (HOSPITAL_COMMUNITY)
Admission: RE | Admit: 2019-03-08 | Discharge: 2019-03-08 | Disposition: A | Discharge status: Home / Self Care | Payer: No Typology Code available for payment source | Source: Ambulatory Visit | Attending: Hematology | Admitting: Hematology

## 2019-03-08 DIAGNOSIS — C3492 Malignant neoplasm of unspecified part of left bronchus or lung: Secondary | ICD-10-CM | POA: Diagnosis not present

## 2019-03-08 DIAGNOSIS — I429 Cardiomyopathy, unspecified: Secondary | ICD-10-CM | POA: Diagnosis not present

## 2019-03-08 DIAGNOSIS — Z5111 Encounter for antineoplastic chemotherapy: Secondary | ICD-10-CM | POA: Diagnosis not present

## 2019-03-08 DIAGNOSIS — I10 Essential (primary) hypertension: Secondary | ICD-10-CM | POA: Diagnosis not present

## 2019-03-08 DIAGNOSIS — J40 Bronchitis, not specified as acute or chronic: Secondary | ICD-10-CM | POA: Diagnosis not present

## 2019-03-08 DIAGNOSIS — Z720 Tobacco use: Secondary | ICD-10-CM | POA: Diagnosis not present

## 2019-03-08 DIAGNOSIS — I2510 Atherosclerotic heart disease of native coronary artery without angina pectoris: Secondary | ICD-10-CM | POA: Diagnosis not present

## 2019-03-08 DIAGNOSIS — E785 Hyperlipidemia, unspecified: Secondary | ICD-10-CM | POA: Insufficient documentation

## 2019-03-08 NOTE — Progress Notes (Signed)
HEMATOLOGY/ONCOLOGY CLINIC NOTE  Date of Service: 03/09/2019  Patient Care Team: [facility] as PCP - General Gwenlyn Found Pearletha Forge, MD as PCP - Cardiology (Cardiology)  CHIEF COMPLAINTS/PURPOSE OF CONSULTATION:  Squamous cell lung cancer  HISTORY OF PRESENTING ILLNESS:    Kyle Foster is a wonderful 73 y.o. male who has been referred to [place] by Dr. Karie Kirks for evaluation and management of Lung Mass concerning for primary lung cancer.   he pt reports he has been having back pain on and off for about 3 months and was being worked up at the [facility] in [place] by his PCP . He reports it was being maanged as MSK pain and he was referred to PT but the symptoms got progressively worse. He presented to the ED on 10/14/18 with SOB and midsternal chest pain, neck pain, and back pain which had been constant for the last 2-3 weeks.   Of note prior to the patient's visit today, pt has had a CTA Chest completed on 10/14/18 with results revealing 5.6 cm left upper lobe lesion with chest wall and thoracic spine invasion, possible epidural extension of tumor. 2. 2.7 cm left hilar mass occluding the left lower lobe pulmonary artery branch and probably attenuating or occluding the inferior left pulmonary vein. 3. Left lower lobe pulmonary nodules possibly metastatic. 4. Negative for acute PE or thoracic aortic dissection. 5. Coronary and Aortic Atherosclerosis.  Most recent lab results (10/15/18) of CBC is as follows: all values are WNL except for RBC at 3.76, [name], [name], Glucose at 111.  He subsequently had an MRI of the T spine which showed Large cavitary mass arising in the superior aspect of the left hilum and extending into the posteromedial aspect of the left upper lobe invading the left side of the T5 and T6 vertebra and extending into the spinal canal with a slight mass effect upon the spinal cord. There is a slight pathologic compression fracture of the T6 vertebral body. 2.  Probable small metastasis in the T8 vertebral body. 3. Metastatic pulmonary nodules at the left lung base posterolaterally. 4. The mass destroys the posterior aspects of the left fifth and sixth ribs.  Patient has had a h/o localized bladder cancer s/p TURBT in 2009. 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  Interval History:   Kyle Foster returns today for management, evaluation, and C4 treatment of his Squamous Cell Carcinoma Lung Cancer. The patient's last visit with [place] was on 02/16/19. The pt reports that he is doing well overall.  The pt reports that he has continued to have some SOB after walking 120 feet from his mailbox up a hill back to his house. He notes that the inhaler has not been very helpful. He notes that this began before chemotherapy ended, and endorses a progressive element to this. He denies any chest pain whatsoever and also denies leg swelling. He notes that he feels a little weak since completing his last cycle of chemotherapy. The pt denies uncontrolled pain at this time.   The pt notes that he is eating well "at times." He notes that he is skipping meals sometimes and is consuming 1 Ensure each day. He has lost 4 pounds in the last 3 weeks.  Of note since the patient's last visit, pt has had an ECHO completed on 03/08/19 with results revealing LV EF of 55-60%.  Lab results  today (03/09/19) of CBC w/diff and CMP is as follows: all values are WNL except for WBC at 12.7k, RBC at 3.26, [name], [name], MCV at 104.0, RDW at 16.6, PLT at 102k, ANC at 11.4k, [name], AST at 13. 03/09/19 Magnesium at 1.8  On review of systems, pt reports some fatigue, dyspnea on exertion, not eating very well, controlled pain, mild weight loss, and denies chest pain, leg swelling, concern for infection, and any other symptoms.   Past Medical History:  Diagnosis Date   Bladder cancer ([place]) 10/2008   CAD  (coronary artery disease)    Cancer of parotid gland ([place]) 12/2009   "squamous cell cancer attached to it; took the gland out"   History of kidney stones    Hyperlipidemia    Hypertension    Myocardial infarction ([place]) 06/2008   Recurrent upper respiratory infection (URI)    09/01/11 saw PCP - Kathryne Eriksson , antibiotic  and prednisone    Skin cancer    "cut & burned off arms, hands, face, neck" (06/14/2018)    SURGICAL HISTORY: Past Surgical History:  Procedure Laterality Date   CATARACT EXTRACTION W/ INTRAOCULAR LENS IMPLANT Right 12/2007   CORONARY ANGIOPLASTY WITH STENT PLACEMENT  06/2008   "3 stents" (06/14/2018)   CYSTOSCOPY W/ RETROGRADES  09/22/2011   Procedure: CYSTOSCOPY WITH RETROGRADE PYELOGRAM;  Surgeon: Bernestine Amass, MD;  Location: [facility];  Service: Urology;  Laterality: Left;  Cystoscopy left Retrograde Pyelogram      (c-arm)    CYSTOSCOPY WITH BIOPSY  09/22/2011   Procedure: CYSTOSCOPY WITH BIOPSY;  Surgeon: Bernestine Amass, MD;  Location: [facility];  Service: Urology;  Laterality: N/A;   Biopsy   EXCISIONAL HEMORRHOIDECTOMY  ~ 2006   EYE SURGERY Left 04/2011   "reconstruction; gold weight in eye lid " (06/14/2018)   FOOT NEUROMA SURGERY Left    INGUINAL HERNIA REPAIR Right 02/2018   IR IMAGING GUIDED PORT INSERTION  11/23/2018   NM MYOCAR PERF WALL MOTION  07/11/2008   MILD ISCHEMIA IN THE BASL INFERIOR, MID INFERIOR & APICAL INFERIOR REGIONS   SALIVARY GLAND SURGERY Left 04/2011   "squamous cell cancer attached to it; took the gland out"   SKIN CANCER EXCISION     "arms, hands, face, neck" (06/14/2018)   TRANSURETHRAL RESECTION OF BLADDER TUMOR WITH GYRUS (TURBT-GYRUS)  2010    SOCIAL HISTORY: Social History   Socioeconomic History   Marital status: Married    Spouse name: Not on file   Number of children: Not on file   Years of education: Not on file   Highest education level: Not on file  Occupational History   Not on file  Social  Needs   Financial resource strain: Not on file   Food insecurity    Worry: Not on file    Inability: Not on file   Transportation needs    Medical: Not on file    Non-medical: Not on file  Tobacco Use   Smoking status: Current Every Day Smoker    Packs/day: 0.50    Years: 50.00    Pack years: 25.00    Types: Cigarettes   Smokeless tobacco: Never Used  Substance and Sexual Activity   Alcohol use: Not Currently   Drug use: Not Currently   Sexual activity: Not on file  Lifestyle   Physical activity    Days per week: Not on file    Minutes per session: Not on file  Stress: Not on file  Relationships   Social connections    Talks on phone: Not on file    Gets together: Not on file    Attends religious service: Not on file    Active member of club or organization: Not on file    Attends meetings of clubs or organizations: Not on file    Relationship status: Not on file   Intimate partner violence    Fear of current or ex partner: Not on file    Emotionally abused: Not on file    Physically abused: Not on file    Forced sexual activity: Not on file  Other Topics Concern   Not on file  Social History Narrative   Not on file    FAMILY HISTORY: Family History  Problem Relation Age of Onset   Heart attack Mother 22   Cancer Mother    Stroke Father 41   Brain cancer Brother     ALLERGIES:  is allergic to codeine.  MEDICATIONS:  Current Outpatient Medications  Medication Sig Dispense Refill   albuterol (VENTOLIN HFA) 108 (90 Base) MCG/ACT inhaler Inhale 1-2 puffs into the lungs every 6 (six) hours as needed for wheezing or shortness of breath. 1 Inhaler 2   atorvastatin (LIPITOR) 80 MG tablet TAKE 1 TABLET BY MOUTH EVERY DAY AT 6PM **NEED OFFICE VISIT** 15 tablet 0   calcium carbonate (TUMS - DOSED IN MG ELEMENTAL CALCIUM) 500 MG chewable tablet Chew 1 tablet (200 mg of elemental calcium total) by mouth 3 (three) times daily with meals. 30 tablet 3     Cholecalciferol (VITAMIN D) 2000 UNITS tablet Take 2,000 Units by mouth 2 (two) times daily.     clopidogrel (PLAVIX) 75 MG tablet TAKE 1 TABLET BY MOUTH DAILY **NEED OFFICE VISIT** 15 tablet 0   dexamethasone (DECADRON) 2 MG tablet Take 1 tablet (2 mg total) by mouth daily with breakfast. 10 tablet 0   dexamethasone (DECADRON) 4 MG tablet Take 2 tablets by mouth once a day on the day after chemotherapy and then take 2 tablets two times a day for 2 days. Take with food. 30 tablet 1   fentaNYL (DURAGESIC) 25 MCG/HR Place 1 patch onto the skin every 3 (three) days. 10 patch 0   gabapentin (NEURONTIN) 300 MG capsule Take 1 capsule (300 mg total) by mouth 3 (three) times daily. 90 capsule 1   Hypromellose (ARTIFICIAL TEARS OP) Place 1 drop into both eyes at bedtime.      lidocaine-prilocaine (EMLA) cream Apply 1 application topically as needed. 30 g 1   LORazepam (ATIVAN) 0.5 MG tablet Take 1 tablet (0.5 mg total) by mouth every 6 (six) hours as needed (Nausea or vomiting). 30 tablet 0   magnesium oxide (MAG-OX) 400 (241.3 Mg) MG tablet Take 1 tablet (400 mg total) by mouth daily. 30 tablet 2   metoprolol tartrate (LOPRESSOR) 25 MG tablet Take 1 tablet (25 mg total) by mouth 2 (two) times daily. OFFICE VISIT NEEDED 60 tablet 0   morphine (MSIR) 15 MG tablet Take 1-2 tablets (15-30 mg total) by mouth every 4 (four) hours as needed for moderate pain or severe pain. 120 tablet 0   ondansetron (ZOFRAN) 8 MG tablet Take 1 tablet (8 mg total) by mouth every 8 (eight) hours as needed for nausea or vomiting. 30 tablet 1   pantoprazole (PROTONIX) 40 MG tablet TAKE 1 TABLET BY MOUTH EVERY DAY 15 tablet 0   prochlorperazine (COMPAZINE) 10 MG tablet TAKE  1 TABLET BY MOUTH EVERY 6 HOURS AS NEEDED FOR NAUSEA AND VOMITING 30 tablet 1   No current facility-administered medications for this visit.    Facility-Administered Medications Ordered in Other Visits  Medication Dose Route Frequency Provider  Last Rate Last Dose   denosumab (XGEVA) injection 120 mg  120 mg Subcutaneous Once Brunetta Genera, MD       diphenhydrAMINE (BENADRYL) injection 25 mg  25 mg Intravenous Once Brunetta Genera, MD       famotidine (PEPCID) IVPB 20 mg premix  20 mg Intravenous Once Brunetta Genera, MD       heparin lock flush 100 unit/mL  500 Units Intracatheter Once PRN Brunetta Genera, MD       pembrolizumab [facility]) 200 mg in sodium chloride 0.9 % 50 mL chemo infusion  200 mg Intravenous Once Brunetta Genera, MD       sodium chloride flush (NS) 0.9 % injection 10 mL  10 mL Intracatheter PRN Brunetta Genera, MD        REVIEW OF SYSTEMS:    A 10+ POINT REVIEW OF SYSTEMS WAS OBTAINED including neurology, dermatology, psychiatry, cardiac, respiratory, lymph, extremities, GI, GU, Musculoskeletal, constitutional, breasts, reproductive, HEENT.  All pertinent positives are noted in the HPI.  All others are negative.   PHYSICAL EXAMINATION: ECOG PERFORMANCE STATUS: 2 - Symptomatic, <50% confined to bed  Vitals:   03/09/19 0946  BP: (!) 108/57  Pulse: 81  Resp: 18  Temp: 99.1 F (37.3 C)  SpO2: 98%   Filed Weights   03/09/19 0946  Weight: 124 lb 4.8 oz (56.4 kg)   .Body mass index is 17.34 kg/m.  GENERAL:alert, in no acute distress and comfortable SKIN: no acute rashes, no significant lesions EYES: conjunctiva are pink and non-injected, sclera anicteric OROPHARYNX: MMM, no exudates, no oropharyngeal erythema or ulceration NECK: supple, no JVD LYMPH:  no palpable lymphadenopathy in the cervical, axillary or inguinal regions LUNGS: clear to auscultation b/l with normal respiratory effort HEART: regular rate & rhythm ABDOMEN:  normoactive bowel sounds , non tender, not distended. No palpable hepatosplenomegaly.  Extremity: no pedal edema PSYCH: alert & oriented x 3 with fluent speech NEURO: no focal motor/sensory deficits   LABORATORY DATA:  I have reviewed the  data as listed  . CBC Latest Ref Rng & Units 03/09/2019 02/16/2019 01/26/2019  WBC 4.0 - 10.5 K/uL 12.7(H) 13.9(H) 10.8(H)  Hemoglobin 13.0 - 17.0 g/dL 10.9(L) 10.6(L) 10.3(L)  Hematocrit 39.0 - 52.0 % 33.9(L) 33.5(L) 32.3(L)  Platelets 150 - 400 K/uL 102(L) 155 140(L)    . CMP Latest Ref Rng & Units 03/09/2019 02/16/2019 01/26/2019  Glucose 70 - 99 mg/dL 87 90 90  BUN 8 - 23 mg/dL 17 16 20   Creatinine 0.61 - 1.24 mg/dL 0.81 0.77 0.81  Sodium 135 - 145 mmol/L 141 141 139  Potassium 3.5 - 5.1 mmol/L 4.5 4.2 4.4  Chloride 98 - 111 mmol/L 108 109 106  CO2 22 - 32 mmol/L 25 25 26   Calcium 8.9 - 10.3 mg/dL 8.9 8.5(L) 8.9  Total Protein 6.5 - 8.1 g/dL 6.5 6.1(L) 6.2(L)  Total Bilirubin 0.3 - 1.2 mg/dL 0.6 0.5 0.6  Alkaline Phos 38 - 126 U/L 97 94 90  AST 15 - 41 U/L 13(L) 13(L) 15  ALT 0 - 44 U/L 8 8 14         RADIOGRAPHIC STUDIES: I have personally reviewed the radiological images as listed and agreed with the findings in the  report. Ct Chest W Contrast  Result Date: 02/10/2019 CLINICAL DATA:  73 year old male with history of left-sided lung cancer diagnosed in February 2020. Ongoing chemotherapy. Radiation therapy complete. EXAM: CT CHEST AND ABDOMEN WITH CONTRAST TECHNIQUE: Multidetector CT imaging of the chest and abdomen was performed following the standard protocol during bolus administration of intravenous contrast. CONTRAST:  150m OMNIPAQUE IOHEXOL 300 MG/ML  SOLN COMPARISON:  PET/CT 11/10/2018. FINDINGS: CT CHEST FINDINGS Cardiovascular: Heart size is normal. There is no significant pericardial fluid, thickening or pericardial calcification. There is aortic atherosclerosis, as well as atherosclerosis of the great vessels of the mediastinum and the coronary arteries, including calcified atherosclerotic plaque in the left anterior descending, left circumflex and right coronary arteries. Thickening calcification of the aortic valve. Right internal jugular single-lumen porta cath with tip  terminating in the right atrium. Mediastinum/Nodes: No definite mediastinal or hilar lymphadenopathy. Esophagus is unremarkable in appearance. No axillary lymphadenopathy. Lungs/Pleura: When compared to the prior PET-CT 11/10/2018, the previously noted cavitary mass has regressed. This appears more cavitary with a thinner wall on today's examination, but continues to invade the posteromedial left chest wall and vertebral column (discussed below), overall measuring approximately 4.1 x 5.8 cm on today's exam, with marked reduction in internal soft tissue along the posterior and medial aspect of the lesion. No other new suspicious appearing pulmonary nodules or masses are noted. No acute consolidative airspace disease. No pleural effusions. Diffuse bronchial wall thickening with moderate centrilobular and paraseptal emphysema. Musculoskeletal: As noted on the prior examination, there are lytic changes in the left side of the T5 and T6 vertebra, involving much of the vertebral bodies, lamina, pedicles and transverse processes, as well as the adjacent posterior left fifth and sixth ribs, related to direct local invasion of the adjacent mass in the superior segment of the left lower lobe. No new osseous lesions are otherwise noted. CT ABDOMEN FINDINGS Hepatobiliary: In segment 7 of the liver there is a new hypovascular lesion (axial image 55 of series 2) measuring 9 mm which is indeterminate, but concerning given the interval development. No intra or extrahepatic biliary ductal dilatation. Gallbladder is normal in appearance. Pancreas: No pancreatic mass. No pancreatic ductal dilatation. No pancreatic or peripancreatic fluid or inflammatory changes. Spleen: Unremarkable. Adrenals/Urinary Tract: 5 mm nonobstructive calculus in the upper pole collecting system of the right kidney. Multiple low-attenuation lesions in the kidneys bilaterally, largest of which are compatible with simple cysts, measuring up to 8.2 cm in the  upper pole the left kidney. Other subcentimeter low-attenuation lesions in both kidneys are too small to definitively characterize, but are favored to represent tiny cysts. No hydroureteronephrosis in the visualized portions of the abdomen. Stomach/Bowel: Normal appearance of the stomach. No pathologic dilatation of visualized portions of small bowel or colon. Vascular/Lymphatic: Aortic atherosclerosis, without evidence of aneurysm or dissection in the abdominal vasculature. No lymphadenopathy noted in the abdomen. Other: No significant volume of ascites and no pneumoperitoneum noted in the visualized portions of the peritoneal cavity. Musculoskeletal: There are no aggressive appearing lytic or blastic lesions noted in the visualized portions of the skeleton. IMPRESSION: 1. Today's study demonstrates a positive response to therapy with slight regression of the primary lesion in the superior segment of the left lower lobe which again demonstrates direct invasion of the adjacent posteromedial left chest wall and vertebral column, as detailed above. 2. However, there is a new hypovascular lesion in segment 7 of the liver. This is nonspecific and too small to characterize, but given the development  compared to prior studies, this is concerning for potential metastasis. This could be definitively characterized with MRI of the abdomen with and without IV gadolinium if clinically appropriate. 3. 5 mm nonobstructive calculus in the right renal collecting system. 4. Aortic atherosclerosis, in addition to 3 vessel coronary artery disease. Assessment for potential risk factor modification, dietary therapy or pharmacologic therapy may be warranted, if clinically indicated. 5. There are calcifications of the aortic valve. Echocardiographic correlation for evaluation of potential valvular dysfunction may be warranted if clinically indicated. 6. Additional incidental findings, as above. Electronically Signed   By: Vinnie Langton  M.D.   On: 02/10/2019 10:26   Ct Abdomen W Contrast  Result Date: 02/10/2019 CLINICAL DATA:  73 year old male with history of left-sided lung cancer diagnosed in February 2020. Ongoing chemotherapy. Radiation therapy complete. EXAM: CT CHEST AND ABDOMEN WITH CONTRAST TECHNIQUE: Multidetector CT imaging of the chest and abdomen was performed following the standard protocol during bolus administration of intravenous contrast. CONTRAST:  151m OMNIPAQUE IOHEXOL 300 MG/ML  SOLN COMPARISON:  PET/CT 11/10/2018. FINDINGS: CT CHEST FINDINGS Cardiovascular: Heart size is normal. There is no significant pericardial fluid, thickening or pericardial calcification. There is aortic atherosclerosis, as well as atherosclerosis of the great vessels of the mediastinum and the coronary arteries, including calcified atherosclerotic plaque in the left anterior descending, left circumflex and right coronary arteries. Thickening calcification of the aortic valve. Right internal jugular single-lumen porta cath with tip terminating in the right atrium. Mediastinum/Nodes: No definite mediastinal or hilar lymphadenopathy. Esophagus is unremarkable in appearance. No axillary lymphadenopathy. Lungs/Pleura: When compared to the prior PET-CT 11/10/2018, the previously noted cavitary mass has regressed. This appears more cavitary with a thinner wall on today's examination, but continues to invade the posteromedial left chest wall and vertebral column (discussed below), overall measuring approximately 4.1 x 5.8 cm on today's exam, with marked reduction in internal soft tissue along the posterior and medial aspect of the lesion. No other new suspicious appearing pulmonary nodules or masses are noted. No acute consolidative airspace disease. No pleural effusions. Diffuse bronchial wall thickening with moderate centrilobular and paraseptal emphysema. Musculoskeletal: As noted on the prior examination, there are lytic changes in the left side of the  T5 and T6 vertebra, involving much of the vertebral bodies, lamina, pedicles and transverse processes, as well as the adjacent posterior left fifth and sixth ribs, related to direct local invasion of the adjacent mass in the superior segment of the left lower lobe. No new osseous lesions are otherwise noted. CT ABDOMEN FINDINGS Hepatobiliary: In segment 7 of the liver there is a new hypovascular lesion (axial image 55 of series 2) measuring 9 mm which is indeterminate, but concerning given the interval development. No intra or extrahepatic biliary ductal dilatation. Gallbladder is normal in appearance. Pancreas: No pancreatic mass. No pancreatic ductal dilatation. No pancreatic or peripancreatic fluid or inflammatory changes. Spleen: Unremarkable. Adrenals/Urinary Tract: 5 mm nonobstructive calculus in the upper pole collecting system of the right kidney. Multiple low-attenuation lesions in the kidneys bilaterally, largest of which are compatible with simple cysts, measuring up to 8.2 cm in the upper pole the left kidney. Other subcentimeter low-attenuation lesions in both kidneys are too small to definitively characterize, but are favored to represent tiny cysts. No hydroureteronephrosis in the visualized portions of the abdomen. Stomach/Bowel: Normal appearance of the stomach. No pathologic dilatation of visualized portions of small bowel or colon. Vascular/Lymphatic: Aortic atherosclerosis, without evidence of aneurysm or dissection in the abdominal vasculature. No lymphadenopathy  noted in the abdomen. Other: No significant volume of ascites and no pneumoperitoneum noted in the visualized portions of the peritoneal cavity. Musculoskeletal: There are no aggressive appearing lytic or blastic lesions noted in the visualized portions of the skeleton. IMPRESSION: 1. Today's study demonstrates a positive response to therapy with slight regression of the primary lesion in the superior segment of the left lower lobe  which again demonstrates direct invasion of the adjacent posteromedial left chest wall and vertebral column, as detailed above. 2. However, there is a new hypovascular lesion in segment 7 of the liver. This is nonspecific and too small to characterize, but given the development compared to prior studies, this is concerning for potential metastasis. This could be definitively characterized with MRI of the abdomen with and without IV gadolinium if clinically appropriate. 3. 5 mm nonobstructive calculus in the right renal collecting system. 4. Aortic atherosclerosis, in addition to 3 vessel coronary artery disease. Assessment for potential risk factor modification, dietary therapy or pharmacologic therapy may be warranted, if clinically indicated. 5. There are calcifications of the aortic valve. Echocardiographic correlation for evaluation of potential valvular dysfunction may be warranted if clinically indicated. 6. Additional incidental findings, as above. Electronically Signed   By: Vinnie Langton M.D.   On: 02/10/2019 10:26    ASSESSMENT & PLAN:   73 y.o. male with  1. 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 10/19/18 Lung biopsy revealed Squamous Cell carcinoma S/p Palliative RT to LUL lung mass with 30Gy in 10 fractions between 10/22/18 and 11/03/18, due to impending cord compression  10/19/18 PDL-1 status report found 30% PDL-1 tumor proportion  11/06/18 MRI Brain revealed No intracranial metastatic disease identified. 2. Mild chronic microvascular ischemic changes and volume loss of the brain.  11/10/18 PET/CT revealed Locally advanced left lung mass, as detailed previously. 2. Left perihilar direct extension versus nodal metastasis. 3. No findings of hypermetabolic distant metastasis. 4. No findings of extrathoracic hypermetabolic metastasis.  02/09/19 CT Chest  and Abdomen revealed "Today's study demonstrates a positive response to therapy with slight regression of the primary lesion in the superior segment of the left lower lobe which again demonstrates direct invasion of the adjacent posteromedial left chest wall and vertebral column, as detailed above. 2. However, there is a new hypovascular lesion in segment 7 of the liver. This is nonspecific and too small to characterize, but given the development compared to prior studies, this is concerning for potential metastasis. This could be definitively characterized with MRI of the abdomen with and without IV gadolinium if clinically appropriate. 3. 5 mm nonobstructive calculus in the right renal collecting system. 4. Aortic atherosclerosis, in addition to 3 vessel coronary artery disease. Assessment for potential risk factor modification, dietary therapy or pharmacologic therapy may be warranted, if clinically Indicated. 5. There are calcifications of the aortic valve. Echocardiographic correlation for evaluation of potential valvular dysfunction may be warranted if clinically indicated. 6. Additional incidental findings, as above." Discussed that I suspect that the liver finding is likely a cyst, as progression through treatment would be unexpected. Nevertheless, will monitor this finding over time with future scans.  S/p 5 cycles of Carboplatin AUC of 5, 12m/m2 Taxol, and Pembrolizumab with G-CSF support completed on 02/16/19.  2. 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 Smoker 4. 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  Has regularly followed up with ENT Dr. Fenton Malling 6. Cancer related pain  PLAN -Discussed pt labwork today, 03/09/19; blood counts and chemistries are stable. -Discussed the 03/08/19 ECHO which  revealed LV EF of 55-60% -The pt has no prohibitive toxicities from beginning maintenance Pembrolizumab every 3 weeks at this time. -Suspect that pt's fatigue is cumulative effect of 5 cycles of chemotherapy. Will continue to monitor this. -Discussed that I recommend that the pt return to care with his PCP soon for management and evaluation of his other medical concerns. -Deconditioning, emphysema contributing to pt's gradually increasing SOB while walking. Will refer the pt to pulmonology for further optimization. -Will re-image pt every 4-6 months  -Stressed the importance of continuing to eat well and not skipping meals and increase to 2 Ensures each day. -Continue Fentanyl patch to 90mg/hr and continue short acting MS IR 1-2 pills every 4-6 hours as needed to optimize mx of cancer related pain. -Continue Senna S two capsules at night, and Miralax once a day, back off if diarrhea develops -Xgeva every 4 weeks to reduce risk of fracture -Avoid pushing, pulling, lifting and bending  -Encouraged pt to walk on level ground, to continue eating well and use Boost or Ensure to supplement meals -Continue 2000 units Vitamin D BID -Continue follow up with our nutritional therapist BErnestene Kieland discussed the importance of maintaining food consumption -Recommended that the pt continue to eat well, drink at least 48-64 oz of water each day, and walk 20-30 minutes each day.  -Advised that pt wear a mask while in public and avoid going into public as much as possible -Will see the pt back in 3 weeks   -Plz schedule next 3 doses of maintenance Pembrolizumab o with labs and MD visit -continue q4weeks Xgeva -Pulmonary referral for optimization of COPD treatment and for evaluation of SOB   All of the patients questions were answered with apparent satisfaction. The patient knows to call the clinic with any problems, questions or concerns.  The total time spent in the appt was 25 minutes and more than  50% was on counseling and direct patient cares.    GSullivan LoneMD MS AAHIVMS SRegency Hospital Of Cleveland WestCWooster Community HospitalHematology/Oncology Physician CEn[facility] (Office):       [phone](Work cell):  [phone](Fax):           [phone] 03/09/2019 11:18 AM  I, SBaldwin Jamaica am acting as a scribe for Dr. GSullivan Lone   .I have reviewed the above documentation for accuracy and completeness, and I agree with the above. .Brunetta GeneraMD

## 2019-03-09 ENCOUNTER — Ambulatory Visit: Payer: No Typology Code available for payment source

## 2019-03-09 ENCOUNTER — Inpatient Hospital Stay: Payer: Medicare Other | Attending: Hematology

## 2019-03-09 ENCOUNTER — Telehealth: Payer: Self-pay | Admitting: Hematology

## 2019-03-09 ENCOUNTER — Other Ambulatory Visit: Payer: Self-pay

## 2019-03-09 ENCOUNTER — Inpatient Hospital Stay: Payer: Medicare Other

## 2019-03-09 ENCOUNTER — Inpatient Hospital Stay (HOSPITAL_BASED_OUTPATIENT_CLINIC_OR_DEPARTMENT_OTHER): Payer: Medicare Other | Admitting: Hematology

## 2019-03-09 VITALS — BP 108/57 | HR 81 | Temp 99.1°F | Resp 18 | Ht 71.0 in | Wt 124.3 lb

## 2019-03-09 DIAGNOSIS — Z7189 Other specified counseling: Secondary | ICD-10-CM

## 2019-03-09 DIAGNOSIS — C7951 Secondary malignant neoplasm of bone: Secondary | ICD-10-CM

## 2019-03-09 DIAGNOSIS — C799 Secondary malignant neoplasm of unspecified site: Secondary | ICD-10-CM

## 2019-03-09 DIAGNOSIS — C3412 Malignant neoplasm of upper lobe, left bronchus or lung: Secondary | ICD-10-CM

## 2019-03-09 DIAGNOSIS — K769 Liver disease, unspecified: Secondary | ICD-10-CM | POA: Diagnosis not present

## 2019-03-09 DIAGNOSIS — G893 Neoplasm related pain (acute) (chronic): Secondary | ICD-10-CM

## 2019-03-09 DIAGNOSIS — Z5112 Encounter for antineoplastic immunotherapy: Secondary | ICD-10-CM | POA: Diagnosis not present

## 2019-03-09 DIAGNOSIS — J449 Chronic obstructive pulmonary disease, unspecified: Secondary | ICD-10-CM

## 2019-03-09 DIAGNOSIS — Z79899 Other long term (current) drug therapy: Secondary | ICD-10-CM | POA: Insufficient documentation

## 2019-03-09 DIAGNOSIS — Z720 Tobacco use: Secondary | ICD-10-CM

## 2019-03-09 DIAGNOSIS — R5383 Other fatigue: Secondary | ICD-10-CM

## 2019-03-09 DIAGNOSIS — C3492 Malignant neoplasm of unspecified part of left bronchus or lung: Secondary | ICD-10-CM

## 2019-03-09 DIAGNOSIS — R0602 Shortness of breath: Secondary | ICD-10-CM

## 2019-03-09 LAB — CBC WITH DIFFERENTIAL/PLATELET
Abs Immature Granulocytes: 0.05 10*3/uL (ref 0.00–0.07)
Basophils Absolute: 0 10*3/uL (ref 0.0–0.1)
Basophils Relative: 0 %
Eosinophils Absolute: 0 10*3/uL (ref 0.0–0.5)
Eosinophils Relative: 0 %
HCT: 33.9 % — ABNORMAL LOW (ref 39.0–52.0)
Hemoglobin: 10.9 g/dL — ABNORMAL LOW (ref 13.0–17.0)
Immature Granulocytes: 0 %
Lymphocytes Relative: 4 %
Lymphs Abs: 0.4 10*3/uL — ABNORMAL LOW (ref 0.7–4.0)
MCH: 33.4 pg (ref 26.0–34.0)
MCHC: 32.2 g/dL (ref 30.0–36.0)
MCV: 104 fL — ABNORMAL HIGH (ref 80.0–100.0)
Monocytes Absolute: 0.7 10*3/uL (ref 0.1–1.0)
Monocytes Relative: 6 %
Neutro Abs: 11.4 10*3/uL — ABNORMAL HIGH (ref 1.7–7.7)
Neutrophils Relative %: 90 %
Platelets: 102 10*3/uL — ABNORMAL LOW (ref 150–400)
RBC: 3.26 MIL/uL — ABNORMAL LOW (ref 4.22–5.81)
RDW: 16.6 % — ABNORMAL HIGH (ref 11.5–15.5)
WBC: 12.7 10*3/uL — ABNORMAL HIGH (ref 4.0–10.5)
nRBC: 0 % (ref 0.0–0.2)

## 2019-03-09 LAB — CMP (CANCER CENTER ONLY)
ALT: 8 U/L (ref 0–44)
AST: 13 U/L — ABNORMAL LOW (ref 15–41)
Albumin: 3.5 g/dL (ref 3.5–5.0)
Alkaline Phosphatase: 97 U/L (ref 38–126)
Anion gap: 8 (ref 5–15)
BUN: 17 mg/dL (ref 8–23)
CO2: 25 mmol/L (ref 22–32)
Calcium: 8.9 mg/dL (ref 8.9–10.3)
Chloride: 108 mmol/L (ref 98–111)
Creatinine: 0.81 mg/dL (ref 0.61–1.24)
GFR, Est AFR Am: >60 mL/min (ref >60)
GFR, Estimated: >60 mL/min (ref >60)
Glucose, Bld: 87 mg/dL (ref 70–99)
Potassium: 4.5 mmol/L (ref 3.5–5.1)
Sodium: 141 mmol/L (ref 135–145)
Total Bilirubin: 0.6 mg/dL (ref 0.3–1.2)
Total Protein: 6.5 g/dL (ref 6.5–8.1)

## 2019-03-09 LAB — MAGNESIUM: Magnesium: 1.8 mg/dL (ref 1.7–2.4)

## 2019-03-09 MED ORDER — DENOSUMAB 120 MG/1.7ML ~~LOC~~ SOLN
120.0000 mg | Freq: Once | SUBCUTANEOUS | Status: AC
  Administered 2019-03-09: 120 mg via SUBCUTANEOUS

## 2019-03-09 MED ORDER — FAMOTIDINE IN NACL 20-0.9 MG/50ML-% IV SOLN
INTRAVENOUS | Status: AC
  Filled 2019-03-09: qty 50

## 2019-03-09 MED ORDER — DIPHENHYDRAMINE HCL 50 MG/ML IJ SOLN
25.0000 mg | Freq: Once | INTRAMUSCULAR | Status: AC
  Administered 2019-03-09: 25 mg via INTRAVENOUS

## 2019-03-09 MED ORDER — FAMOTIDINE IN NACL 20-0.9 MG/50ML-% IV SOLN
20.0000 mg | Freq: Once | INTRAVENOUS | Status: AC
  Administered 2019-03-09: 20 mg via INTRAVENOUS

## 2019-03-09 MED ORDER — SODIUM CHLORIDE 0.9 % IV SOLN
Freq: Once | INTRAVENOUS | Status: AC
  Administered 2019-03-09: 11:00:00 via INTRAVENOUS
  Filled 2019-03-09: qty 250

## 2019-03-09 MED ORDER — SODIUM CHLORIDE 0.9% FLUSH
10.0000 mL | INTRAVENOUS | Status: DC | PRN
  Administered 2019-03-09: 10 mL
  Filled 2019-03-09: qty 10

## 2019-03-09 MED ORDER — DENOSUMAB 120 MG/1.7ML ~~LOC~~ SOLN
SUBCUTANEOUS | Status: AC
  Filled 2019-03-09: qty 1.7

## 2019-03-09 MED ORDER — DIPHENHYDRAMINE HCL 50 MG/ML IJ SOLN
INTRAMUSCULAR | Status: AC
  Filled 2019-03-09: qty 1

## 2019-03-09 MED ORDER — HEPARIN SOD (PORK) LOCK FLUSH 100 UNIT/ML IV SOLN
500.0000 [IU] | Freq: Once | INTRAVENOUS | Status: AC | PRN
  Administered 2019-03-09: 500 [IU]
  Filled 2019-03-09: qty 5

## 2019-03-09 MED ORDER — SODIUM CHLORIDE 0.9 % IV SOLN
200.0000 mg | Freq: Once | INTRAVENOUS | Status: AC
  Administered 2019-03-09: 200 mg via INTRAVENOUS
  Filled 2019-03-09: qty 8

## 2019-03-09 NOTE — Telephone Encounter (Signed)
Scheduled appt per 7/1 los. Spoke with patient wife and she is aware of appt date and times.

## 2019-03-09 NOTE — Patient Instructions (Signed)
[facility] Discharge Instructions for Patients Receiving Chemotherapy  Today you received the following chemotherapy agent: Keytruda  To help prevent nausea and vomiting after your treatment, we encourage you to take your nausea medication as directed by your MD.   If you develop nausea and vomiting that is not controlled by your nausea medication, call the clinic.   BELOW ARE SYMPTOMS THAT SHOULD BE REPORTED IMMEDIATELY:  *FEVER GREATER THAN 100.5 F  *CHILLS WITH OR WITHOUT FEVER  NAUSEA AND VOMITING THAT IS NOT CONTROLLED WITH YOUR NAUSEA MEDICATION  *UNUSUAL SHORTNESS OF BREATH  *UNUSUAL BRUISING OR BLEEDING  TENDERNESS IN MOUTH AND THROAT WITH OR WITHOUT PRESENCE OF ULCERS  *URINARY PROBLEMS  *BOWEL PROBLEMS  UNUSUAL RASH Items with * indicate a potential emergency and should be followed up as soon as possible.  Feel free to call the clinic should you have any questions or concerns. The clinic phone number is [phone].  Please show the [facility] at check-in to the Emergency Department and triage nurse.    Denosumab injection What is this medicine? DENOSUMAB (den oh sue mab) slows bone breakdown. Prolia is used to treat osteoporosis in women after menopause and in men, and in people who are taking corticosteroids for 6 months or more. Delton See is used to treat a high calcium level due to cancer and to prevent bone fractures and other bone problems caused by multiple myeloma or cancer bone metastases. Delton See is also used to treat giant cell tumor of the bone. This medicine may be used for other purposes; ask your health care provider or pharmacist if you have questions. COMMON BRAND NAME(S): Prolia, XGEVA What should I tell my health care provider before I take this medicine? They need to know if you have any of these conditions:  dental disease  having surgery or tooth extraction  infection  kidney disease  low levels of calcium or  Vitamin D in the blood  malnutrition  on hemodialysis  skin conditions or sensitivity  thyroid or parathyroid disease  an unusual reaction to denosumab, other medicines, foods, dyes, or preservatives  pregnant or trying to get pregnant  breast-feeding How should I use this medicine? This medicine is for injection under the skin. It is given by a health care professional in a hospital or clinic setting. A special MedGuide will be given to you before each treatment. Be sure to read this information carefully each time. For Prolia, talk to your pediatrician regarding the use of this medicine in children. Special care may be needed. For Delton See, talk to your pediatrician regarding the use of this medicine in children. While this drug may be prescribed for children as young as 13 years for selected conditions, precautions do apply. Overdosage: If you think you have taken too much of this medicine contact a poison control center or emergency room at once. NOTE: This medicine is only for you. Do not share this medicine with others. What if I miss a dose? It is important not to miss your dose. Call your doctor or health care professional if you are unable to keep an appointment. What may interact with this medicine? Do not take this medicine with any of the following medications:  other medicines containing denosumab This medicine may also interact with the following medications:  medicines that lower your chance of fighting infection  steroid medicines like prednisone or cortisone This list may not describe all possible interactions. Give your health care provider a list of  all the medicines, herbs, non-prescription drugs, or dietary supplements you use. Also tell them if you smoke, drink alcohol, or use illegal drugs. Some items may interact with your medicine. What should I watch for while using this medicine? Visit your doctor or health care professional for regular checks on your  progress. Your doctor or health care professional may order blood tests and other tests to see how you are doing. Call your doctor or health care professional for advice if you get a fever, chills or sore throat, or other symptoms of a cold or flu. Do not treat yourself. This drug may decrease your body's ability to fight infection. Try to avoid being around people who are sick. You should make sure you get enough calcium and vitamin D while you are taking this medicine, unless your doctor tells you not to. Discuss the foods you eat and the vitamins you take with your health care professional. See your dentist regularly. Brush and floss your teeth as directed. Before you have any dental work done, tell your dentist you are receiving this medicine. Do not become pregnant while taking this medicine or for 5 months after stopping it. Talk with your doctor or health care professional about your birth control options while taking this medicine. Women should inform their doctor if they wish to become pregnant or think they might be pregnant. There is a potential for serious side effects to an unborn child. Talk to your health care professional or pharmacist for more information. What side effects may I notice from receiving this medicine? Side effects that you should report to your doctor or health care professional as soon as possible:  allergic reactions like skin rash, itching or hives, swelling of the face, lips, or tongue  bone pain  breathing problems  dizziness  jaw pain, especially after dental work  redness, blistering, peeling of the skin  signs and symptoms of infection like fever or chills; cough; sore throat; pain or trouble passing urine  signs of low calcium like fast heartbeat, muscle cramps or muscle pain; pain, tingling, numbness in the hands or feet; seizures  unusual bleeding or bruising  unusually weak or tired Side effects that usually do not require medical attention  (report to your doctor or health care professional if they continue or are bothersome):  constipation  diarrhea  headache  joint pain  loss of appetite  muscle pain  runny nose  tiredness  upset stomach This list may not describe all possible side effects. Call your doctor for medical advice about side effects. You may report side effects to FDA at 1-800-FDA-1088. Where should I keep my medicine? This medicine is only given in a clinic, doctor's office, or other health care setting and will not be stored at home. NOTE: This sheet is a summary. It may not cover all possible information. If you have questions about this medicine, talk to your doctor, pharmacist, or health care provider.  2020 Elsevier/Gold Standard (2018-01-01 16:10:44)

## 2019-03-09 NOTE — Progress Notes (Signed)
Per MD [name] pt to receive pre-medications (pepcid & benadryl) today with keytruda infusion.  Pharmacy aware.

## 2019-03-10 ENCOUNTER — Other Ambulatory Visit: Payer: Self-pay | Admitting: Cardiovascular Disease

## 2019-03-10 ENCOUNTER — Telehealth: Payer: Self-pay | Admitting: Nutrition

## 2019-03-10 ENCOUNTER — Telehealth: Payer: Self-pay | Admitting: Hematology

## 2019-03-10 NOTE — Telephone Encounter (Signed)
Contacted patient to verify telephone visit for pre reg °

## 2019-03-14 ENCOUNTER — Inpatient Hospital Stay: Payer: Medicare Other | Admitting: Nutrition

## 2019-03-14 NOTE — Progress Notes (Signed)
RD working remotely.  Contacted patient for nutrition follow up. 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 Patient has had continued weight loss which he contributes to poor appetite and early satiety. Denies N, V, C, or D. Current weight documented as 124.3 pounds July 1 down from 131.8 pounds April 29. Dietary recall reveals patient eating eggs, sausage biscuit, spaghetti. He drinks coke, water, juice and gatorade. He is tired of Ensure and has not been consuming.  Nutrition Diagnosis: Unintended weight loss continues.  Intervention: Educated patient to consume CIB and add fruit for variety. Encouraged small snacks between meals. Recommended he refer back to hand out on taste alterations. Increase fluid intake with fluids that have calories. Encouraged activity per MD.  Monitoring, Evaluation, Goals: Increase oral intake to minimize weight loss.  Next Visit: Thursday, July 23 by telephone.

## 2019-03-16 ENCOUNTER — Other Ambulatory Visit: Payer: Self-pay | Admitting: *Deleted

## 2019-03-16 MED ORDER — FENTANYL 25 MCG/HR TD PT72: 1.0000 | MEDICATED_PATCH | TRANSDERMAL | 0 refills | Status: DC

## 2019-03-16 NOTE — Telephone Encounter (Signed)
Patient called, requested refill of Fentanyl.

## 2019-03-21 ENCOUNTER — Telehealth: Payer: Self-pay | Admitting: Cardiology

## 2019-03-21 ENCOUNTER — Ambulatory Visit (INDEPENDENT_AMBULATORY_CARE_PROVIDER_SITE_OTHER): Payer: Medicare Other | Admitting: Pulmonary Disease

## 2019-03-21 ENCOUNTER — Encounter: Payer: Self-pay | Admitting: Pulmonary Disease

## 2019-03-21 ENCOUNTER — Other Ambulatory Visit: Payer: Self-pay

## 2019-03-21 VITALS — BP 104/56 | HR 95 | Temp 98.1°F | Ht 71.0 in | Wt 123.4 lb

## 2019-03-21 DIAGNOSIS — J439 Emphysema, unspecified: Secondary | ICD-10-CM | POA: Insufficient documentation

## 2019-03-21 DIAGNOSIS — Z720 Tobacco use: Secondary | ICD-10-CM

## 2019-03-21 DIAGNOSIS — F17210 Nicotine dependence, cigarettes, uncomplicated: Secondary | ICD-10-CM | POA: Diagnosis not present

## 2019-03-21 DIAGNOSIS — J432 Centrilobular emphysema: Secondary | ICD-10-CM

## 2019-03-21 MED ORDER — INCRUSE ELLIPTA 62.5 MCG/INH IN AEPB: 1.0000 | INHALATION_SPRAY | Freq: Every day | RESPIRATORY_TRACT | 0 refills | Status: DC

## 2019-03-21 MED ORDER — INCRUSE ELLIPTA 62.5 MCG/INH IN AEPB: 1.0000 | INHALATION_SPRAY | Freq: Every day | RESPIRATORY_TRACT | 3 refills | Status: DC

## 2019-03-21 NOTE — Progress Notes (Signed)
Patient seen in the office today and instructed on use of incruse inhaler.  Patient expressed understanding and demonstrated technique.
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
## 2019-03-21 NOTE — Patient Instructions (Signed)
COPD/Emphysema  Medications ordered: --START Incruse 1 puff daily. Take this EVERYDAY. --CONTINUE albuterol as needed for wheezing or shortness of breath. Take this WHEN YOU HAVE SYMPTOMS. --Will consider nebulizer treatment in the future  Tests ordered: --Pulmonary function test before your next visit  Follow up with me in 1 month    Smoking Tobacco Information, Adult Smoking tobacco can be harmful to your health. Tobacco contains a poisonous (toxic), colorless chemical called nicotine. Nicotine is addictive. It changes the brain and can make it hard to stop smoking. Tobacco also has other toxic chemicals that can hurt your body and raise your risk of many cancers. How can smoking tobacco affect me? Smoking tobacco puts you at risk for:  Cancer. Smoking is most commonly associated with lung cancer, but can also lead to cancer in other parts of the body.  Chronic obstructive pulmonary disease (COPD). This is a long-term lung condition that makes it hard to breathe. It also gets worse over time.  High blood pressure (hypertension), heart disease, stroke, or heart attack.  Lung infections, such as pneumonia.  Cataracts. This is when the lenses in the eyes become clouded.  Digestive problems. This may include peptic ulcers, heartburn, and gastroesophageal reflux disease (GERD).  Oral health problems, such as gum disease and tooth loss.  Loss of taste and smell. Smoking can affect your appearance by causing:  Wrinkles.  Yellow or stained teeth, fingers, and fingernails. Smoking tobacco can also affect your social life, because:  It may be challenging to find places to smoke when away from home. Many workplaces, [employer], hotels, and public places are tobacco-free.  Smoking is expensive. This is due to the cost of tobacco and the long-term costs of treating health problems from smoking.  Secondhand smoke may affect those around you. Secondhand smoke can cause lung cancer,  breathing problems, and heart disease. Children of smokers have a higher risk for: ? Sudden infant death syndrome (SIDS). ? Ear infections. ? Lung infections. If you currently smoke tobacco, quitting now can help you:  Lead a longer and healthier life.  Look, smell, breathe, and feel better over time.  Save money.  Protect others from the harms of secondhand smoke. What actions can I take to prevent health problems? Quit smoking   Do not start smoking. Quit if you already do.  Make a plan to quit smoking and commit to it. Look for programs to help you and ask your health care provider for recommendations and ideas.  Set a date and write down all the reasons you want to quit.  Let your friends and family know you are quitting so they can help and support you. Consider finding friends who also want to quit. It can be easier to quit with someone else, so that you can support each other.  Talk with your health care provider about using nicotine replacement medicines to help you quit, such as gum, lozenges, patches, sprays, or pills.  Do not replace cigarette smoking with electronic cigarettes, which are commonly called e-cigarettes. The safety of e-cigarettes is not known, and some may contain harmful chemicals.  If you try to quit but return to smoking, stay positive. It is common to slip up when you first quit, so take it one day at a time.  Be prepared for cravings. When you feel the urge to smoke, chew gum or suck on hard candy. Lifestyle  Stay busy and take care of your body.  Drink enough fluid to keep your urine  pale yellow.  Get plenty of exercise and eat a healthy diet. This can help prevent weight gain after quitting.  Monitor your eating habits. Quitting smoking can cause you to have a larger appetite than when you smoke.  Find ways to relax. Go out with friends or family to a movie or a restaurant where people do not smoke.  Ask your health care provider about  having regular tests (screenings) to check for cancer. This may include blood tests, imaging tests, and other tests.  Find ways to manage your stress, such as meditation, yoga, or exercise. Where to find support To get support to quit smoking, consider:  Asking your health care provider for more information and resources.  Taking classes to learn more about quitting smoking.  Looking for local organizations that offer resources about quitting smoking.  Joining a support group for people who want to quit smoking in your local community.  Calling the smokefree.gov counselor helpline: 1-800-Quit-Now 774-737-4970) Where to find more information You may find more information about quitting smoking from:  HelpGuide.org: www.helpguide.org  https://hall.com/: smokefree.gov  American Lung Association: www.lung.org Contact a health care provider if you:  Have problems breathing.  Notice that your lips, nose, or fingers turn blue.  Have chest pain.  Are coughing up blood.  Feel faint or you pass out.  Have other health changes that cause you to worry. Summary  Smoking tobacco can negatively affect your health, the health of those around you, your finances, and your social life.  Do not start smoking. Quit if you already do. If you need help quitting, ask your health care provider.  Think about joining a support group for people who want to quit smoking in your local community. There are many effective programs that will help you to quit this behavior. This information is not intended to replace advice given to you by your health care provider. Make sure you discuss any questions you have with your health care provider. Document Released: 09/09/2016 Document Revised: 10/14/2017 Document Reviewed: 09/09/2016 Elsevier Patient Education  2020 [employer].

## 2019-03-21 NOTE — Telephone Encounter (Signed)
LVM, reminding p of his appt with Kerin Ransom on 03-22-19.

## 2019-03-22 ENCOUNTER — Encounter: Payer: Self-pay | Admitting: Cardiology

## 2019-03-22 ENCOUNTER — Ambulatory Visit (INDEPENDENT_AMBULATORY_CARE_PROVIDER_SITE_OTHER): Payer: Medicare Other | Admitting: Cardiology

## 2019-03-22 VITALS — BP 110/64 | HR 75 | Temp 98.4°F | Ht 71.0 in | Wt 123.4 lb

## 2019-03-22 DIAGNOSIS — Z9861 Coronary angioplasty status: Secondary | ICD-10-CM | POA: Diagnosis not present

## 2019-03-22 DIAGNOSIS — J432 Centrilobular emphysema: Secondary | ICD-10-CM | POA: Diagnosis not present

## 2019-03-22 DIAGNOSIS — I2510 Atherosclerotic heart disease of native coronary artery without angina pectoris: Secondary | ICD-10-CM | POA: Diagnosis not present

## 2019-03-22 DIAGNOSIS — Z720 Tobacco use: Secondary | ICD-10-CM | POA: Diagnosis not present

## 2019-03-22 DIAGNOSIS — I10 Essential (primary) hypertension: Secondary | ICD-10-CM | POA: Diagnosis not present

## 2019-03-22 DIAGNOSIS — I452 Bifascicular block: Secondary | ICD-10-CM

## 2019-03-22 DIAGNOSIS — E785 Hyperlipidemia, unspecified: Secondary | ICD-10-CM | POA: Diagnosis not present

## 2019-03-22 DIAGNOSIS — C3412 Malignant neoplasm of upper lobe, left bronchus or lung: Secondary | ICD-10-CM | POA: Diagnosis not present

## 2019-03-22 DIAGNOSIS — Z77098 Contact with and (suspected) exposure to other hazardous, chiefly nonmedicinal, chemicals: Secondary | ICD-10-CM | POA: Insufficient documentation

## 2019-03-22 MED ORDER — ATORVASTATIN CALCIUM 80 MG PO TABS: 80.0000 mg | ORAL_TABLET | Freq: Every day | ORAL | 3 refills | Status: DC

## 2019-03-22 MED ORDER — PANTOPRAZOLE SODIUM 40 MG PO TBEC: 40.0000 mg | DELAYED_RELEASE_TABLET | Freq: Every day | ORAL | 3 refills | Status: DC

## 2019-03-22 MED ORDER — METOPROLOL TARTRATE 25 MG PO TABS: 25.0000 mg | ORAL_TABLET | Freq: Two times a day (BID) | ORAL | 3 refills | Status: DC

## 2019-03-22 MED ORDER — CLOPIDOGREL BISULFATE 75 MG PO TABS: 75.0000 mg | ORAL_TABLET | Freq: Every day | ORAL | 3 refills | Status: DC

## 2019-03-22 NOTE — Assessment & Plan Note (Signed)
He continues on high dose statin Rx

## 2019-03-22 NOTE — Assessment & Plan Note (Signed)
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
## 2019-03-22 NOTE — Patient Instructions (Signed)
Medication Instructions:  Your physician recommends that you continue on your current medications as directed. Please refer to the Current Medication list given to you today. If you need a refill on your cardiac medications before your next appointment, please call your pharmacy.   Lab work: None  If you have labs (blood work) drawn today and your tests are completely normal, you will receive your results only by: Marland Kitchen MyChart Message (if you have MyChart) OR . A paper copy in the mail If you have any lab test that is abnormal or we need to change your treatment, we will call you to review the results.  Testing/Procedures: None   Follow-Up: At [facility], you and your health needs are our priority.  As part of our continuing mission to provide you with exceptional heart care, we have created designated Provider Care Teams.  These Care Teams include your primary Cardiologist (physician) and Advanced Practice Providers (APPs -  Physician Assistants and Nurse Practitioners) who all work together to provide you with the care you need, when you need it. You will need a follow up appointment in 12 months.  Please call our office 2 months in advance to schedule this appointment.  You may see Quay Burow, MD or one of the following Advanced Practice Providers on your designated Care Team:   Kerin Ransom, PA-C Roby Lofts, [place] . Sande Rives, PA-C  Any Other Special Instructions Will Be Listed Below (If Applicable).

## 2019-03-22 NOTE — Assessment & Plan Note (Signed)
Chronic. 

## 2019-03-22 NOTE — Assessment & Plan Note (Signed)
Controlled- Lisinopril stopped by Dr [name]

## 2019-03-22 NOTE — Assessment & Plan Note (Signed)
AVG and OM PCI with BMS 2009. Residual 70% RCA, normal LVF.

## 2019-03-22 NOTE — Assessment & Plan Note (Signed)
Followed at [place]- he is on disability

## 2019-03-29 ENCOUNTER — Other Ambulatory Visit: Payer: Self-pay | Admitting: Hematology

## 2019-03-30 ENCOUNTER — Other Ambulatory Visit: Payer: Self-pay

## 2019-03-30 ENCOUNTER — Inpatient Hospital Stay: Payer: Medicare Other

## 2019-03-30 ENCOUNTER — Telehealth: Payer: Self-pay | Admitting: Hematology

## 2019-03-30 ENCOUNTER — Inpatient Hospital Stay (HOSPITAL_BASED_OUTPATIENT_CLINIC_OR_DEPARTMENT_OTHER): Payer: Medicare Other | Admitting: Hematology

## 2019-03-30 VITALS — BP 116/67 | HR 68 | Temp 98.7°F | Resp 18 | Ht 71.0 in | Wt 121.4 lb

## 2019-03-30 DIAGNOSIS — Z79899 Other long term (current) drug therapy: Secondary | ICD-10-CM | POA: Diagnosis not present

## 2019-03-30 DIAGNOSIS — R630 Anorexia: Secondary | ICD-10-CM | POA: Diagnosis not present

## 2019-03-30 DIAGNOSIS — C3412 Malignant neoplasm of upper lobe, left bronchus or lung: Secondary | ICD-10-CM

## 2019-03-30 DIAGNOSIS — C7951 Secondary malignant neoplasm of bone: Secondary | ICD-10-CM

## 2019-03-30 DIAGNOSIS — R682 Dry mouth, unspecified: Secondary | ICD-10-CM

## 2019-03-30 DIAGNOSIS — C3492 Malignant neoplasm of unspecified part of left bronchus or lung: Secondary | ICD-10-CM

## 2019-03-30 DIAGNOSIS — R1310 Dysphagia, unspecified: Secondary | ICD-10-CM

## 2019-03-30 DIAGNOSIS — Z5112 Encounter for antineoplastic immunotherapy: Secondary | ICD-10-CM | POA: Diagnosis not present

## 2019-03-30 DIAGNOSIS — K769 Liver disease, unspecified: Secondary | ICD-10-CM | POA: Diagnosis not present

## 2019-03-30 DIAGNOSIS — R1312 Dysphagia, oropharyngeal phase: Secondary | ICD-10-CM

## 2019-03-30 DIAGNOSIS — Z7189 Other specified counseling: Secondary | ICD-10-CM

## 2019-03-30 DIAGNOSIS — Z720 Tobacco use: Secondary | ICD-10-CM

## 2019-03-30 DIAGNOSIS — C799 Secondary malignant neoplasm of unspecified site: Secondary | ICD-10-CM

## 2019-03-30 LAB — CBC WITH DIFFERENTIAL/PLATELET
Abs Immature Granulocytes: 0.01 10*3/uL (ref 0.00–0.07)
Basophils Absolute: 0 10*3/uL (ref 0.0–0.1)
Basophils Relative: 0 %
Eosinophils Absolute: 0.1 10*3/uL (ref 0.0–0.5)
Eosinophils Relative: 2 %
HCT: 35.8 % — ABNORMAL LOW (ref 39.0–52.0)
Hemoglobin: 11.8 g/dL — ABNORMAL LOW (ref 13.0–17.0)
Immature Granulocytes: 0 %
Lymphocytes Relative: 10 %
Lymphs Abs: 0.5 10*3/uL — ABNORMAL LOW (ref 0.7–4.0)
MCH: 34.1 pg — ABNORMAL HIGH (ref 26.0–34.0)
MCHC: 33 g/dL (ref 30.0–36.0)
MCV: 103.5 fL — ABNORMAL HIGH (ref 80.0–100.0)
Monocytes Absolute: 0.5 10*3/uL (ref 0.1–1.0)
Monocytes Relative: 10 %
Neutro Abs: 4.1 10*3/uL (ref 1.7–7.7)
Neutrophils Relative %: 78 %
Platelets: 191 10*3/uL (ref 150–400)
RBC: 3.46 MIL/uL — ABNORMAL LOW (ref 4.22–5.81)
RDW: 15.3 % (ref 11.5–15.5)
WBC: 5.2 10*3/uL (ref 4.0–10.5)
nRBC: 0 % (ref 0.0–0.2)

## 2019-03-30 LAB — CMP (CANCER CENTER ONLY)
ALT: 11 U/L (ref 0–44)
AST: 15 U/L (ref 15–41)
Albumin: 3.8 g/dL (ref 3.5–5.0)
Alkaline Phosphatase: 67 U/L (ref 38–126)
Anion gap: 10 (ref 5–15)
BUN: 18 mg/dL (ref 8–23)
CO2: 25 mmol/L (ref 22–32)
Calcium: 9.4 mg/dL (ref 8.9–10.3)
Chloride: 109 mmol/L (ref 98–111)
Creatinine: 0.91 mg/dL (ref 0.61–1.24)
GFR, Est AFR Am: >60 mL/min (ref >60)
GFR, Estimated: >60 mL/min (ref >60)
Glucose, Bld: 101 mg/dL — ABNORMAL HIGH (ref 70–99)
Potassium: 4.3 mmol/L (ref 3.5–5.1)
Sodium: 144 mmol/L (ref 135–145)
Total Bilirubin: 0.6 mg/dL (ref 0.3–1.2)
Total Protein: 6.6 g/dL (ref 6.5–8.1)

## 2019-03-30 LAB — TSH: TSH: 0.929 u[IU]/mL (ref 0.320–4.118)

## 2019-03-30 LAB — MAGNESIUM: Magnesium: 1.9 mg/dL (ref 1.7–2.4)

## 2019-03-30 MED ORDER — FAMOTIDINE IN NACL 20-0.9 MG/50ML-% IV SOLN
INTRAVENOUS | Status: AC
  Filled 2019-03-30: qty 50

## 2019-03-30 MED ORDER — FAMOTIDINE IN NACL 20-0.9 MG/50ML-% IV SOLN
20.0000 mg | Freq: Once | INTRAVENOUS | Status: AC
  Administered 2019-03-30: 20 mg via INTRAVENOUS

## 2019-03-30 MED ORDER — SODIUM CHLORIDE 0.9% FLUSH
10.0000 mL | INTRAVENOUS | Status: DC | PRN
  Administered 2019-03-30: 10 mL
  Filled 2019-03-30: qty 10

## 2019-03-30 MED ORDER — DIPHENHYDRAMINE HCL 50 MG/ML IJ SOLN
25.0000 mg | Freq: Once | INTRAMUSCULAR | Status: AC
  Administered 2019-03-30: 25 mg via INTRAVENOUS

## 2019-03-30 MED ORDER — ONDANSETRON HCL 8 MG PO TABS: 8.0000 mg | ORAL_TABLET | Freq: Three times a day (TID) | ORAL | 1 refills | Status: DC | PRN

## 2019-03-30 MED ORDER — SODIUM CHLORIDE 0.9 % IV SOLN
200.0000 mg | Freq: Once | INTRAVENOUS | Status: AC
  Administered 2019-03-30: 200 mg via INTRAVENOUS
  Filled 2019-03-30: qty 8

## 2019-03-30 MED ORDER — DRONABINOL 5 MG PO CAPS: 5.0000 mg | ORAL_CAPSULE | Freq: Two times a day (BID) | ORAL | 0 refills | Status: DC

## 2019-03-30 MED ORDER — HEPARIN SOD (PORK) LOCK FLUSH 100 UNIT/ML IV SOLN
500.0000 [IU] | Freq: Once | INTRAVENOUS | Status: AC | PRN
  Administered 2019-03-30: 500 [IU]
  Filled 2019-03-30: qty 5

## 2019-03-30 MED ORDER — DIPHENHYDRAMINE HCL 50 MG/ML IJ SOLN
INTRAMUSCULAR | Status: AC
  Filled 2019-03-30: qty 1

## 2019-03-30 MED ORDER — SODIUM CHLORIDE 0.9 % IV SOLN
Freq: Once | INTRAVENOUS | Status: AC
  Administered 2019-03-30: 10:00:00 via INTRAVENOUS
  Filled 2019-03-30: qty 250

## 2019-03-30 NOTE — Telephone Encounter (Signed)
Per 7/22 los, appts already scheduled.

## 2019-03-30 NOTE — Progress Notes (Signed)
Continue anti-histamines as premeds for Keytruda per Dr. [name]. Entered note in Rx Communication in treatment plan. Kennith Center, Pharm.D., CPP 03/30/2019@10 :12 AM

## 2019-03-30 NOTE — Progress Notes (Signed)
Sent scheduling message to add flush appointments and extend future Keytruda appointments to 180 minutes r/t premeds.
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
## 2019-03-31 ENCOUNTER — Inpatient Hospital Stay: Payer: Medicare Other | Admitting: Nutrition

## 2019-03-31 NOTE — Progress Notes (Signed)
Nutrition follow-up completed with patient over the telephone.   Patient has metastatic lung cancer and is receiving Keytruda.   He is a patient of Dr. [name]. Weight decreased and documented as 121.4 pounds down from 124.3 pounds.  Patient remains underweight. Labs were reviewed. Patient reports he can chew food well but he has difficulty swallowing.  Noted swallowing evaluation is pending. He has a very poor appetite and describes early satiety. Reports he vomited after drinking Ensure earlier this week. He is willing to try Carnation breakfast but they have not purchased yet.  Nutrition diagnosis: Unintended weight loss continues.  Intervention: Patient educated to consume small frequent meals and snacks. Encourage nausea medicine as needed. Provided support and encouragement. Discontinue Ensure and try Carnation breakfast as tolerated. Questions answered.  Teach back method used.  Monitoring, evaluation, goals: Patient will tolerate increased calories and protein to minimize weight loss and improve quality of life.  Next visit: Wednesday, August 12 during infusion.  **Disclaimer: This note was dictated with voice recognition software. Similar sounding words can inadvertently be transcribed and this note may contain transcription errors which may not have been corrected upon publication of note.**

## 2019-04-01 ENCOUNTER — Telehealth: Payer: Self-pay | Admitting: *Deleted

## 2019-04-01 NOTE — Telephone Encounter (Signed)
Ms. Cerreta called. Stated she was informed by pharmacy that Dronabinol prescribed for patient was not approved by insurance. She contacted insurance company and they told her the medicine was not approved, but they sent the office a form to fill out and then it would be filled. She wanted to request it be done as soon as possible.  Explained Prior Authorization process -  that once  Prior Authorization ppwk received at [facility] would be completed by [facility] nurse assigned to complete PAs. PPwk then sent to insurance company for review and decision. Insurance company would notify the pharmacy of their decision.  She stated appreciation for explanation of process and that she would check back with pharmacy to see if medication could be filled.

## 2019-04-04 ENCOUNTER — Telehealth: Payer: Self-pay

## 2019-04-04 NOTE — Telephone Encounter (Signed)
Contacted by Izora Ribas. Patient scheduled for XGEVA on 7/29 but not due until 04/17/19. Per Dr. [name] it is ok to cancel 7/29 [facility] appointment and have patient receive XGEVA with Keytruda on 04/17/19. Left message for patient to make him aware of canceled appointment on 7/29.

## 2019-04-06 ENCOUNTER — Ambulatory Visit: Payer: No Typology Code available for payment source

## 2019-04-07 ENCOUNTER — Telehealth: Payer: Self-pay | Admitting: *Deleted

## 2019-04-07 NOTE — Telephone Encounter (Signed)
Patient wife called  - Dronabinol (MARINOL) not authorized by their insurance. She would like to know if there are other options. Advised her that Dr.Kale out of office and will receive message on his return.

## 2019-04-15 ENCOUNTER — Other Ambulatory Visit: Payer: Self-pay | Admitting: *Deleted

## 2019-04-15 MED ORDER — FENTANYL 25 MCG/HR TD PT72: 1.0000 | MEDICATED_PATCH | TRANSDERMAL | 0 refills | Status: DC

## 2019-04-15 MED ORDER — MORPHINE SULFATE 15 MG PO TABS: 15.0000 mg | ORAL_TABLET | ORAL | 0 refills | Status: DC | PRN

## 2019-04-15 NOTE — Telephone Encounter (Signed)
Wife called and requested refills of MS-IR and Fentanyl patch.

## 2019-04-18 NOTE — Progress Notes (Signed)
HEMATOLOGY/ONCOLOGY CLINIC NOTE  Date of Service: 04/20/2019  Patient Care Team: [facility], [facility] as PCP - General Gwenlyn Found Pearletha Forge, MD as PCP - Cardiology (Cardiology) Brunetta Genera, MD as Consulting Physician (Hematology)  CHIEF COMPLAINTS/PURPOSE OF CONSULTATION:  Squamous cell lung cancer  HISTORY OF PRESENTING ILLNESS:    Kyle Foster is a wonderful 73 y.o. male who has been referred to [place] by Dr. Karie Kirks for evaluation and management of Lung Mass concerning for primary lung cancer.   he pt reports he has been having back pain on and off for about 3 months and was being worked up at the [facility] in [place] by his PCP . He reports it was being maanged as MSK pain and he was referred to PT but the symptoms got progressively worse. He presented to the ED on 10/14/18 with SOB and midsternal chest pain, neck pain, and back pain which had been constant for the last 2-3 weeks.   Of note prior to the patient's visit today, pt has had a CTA Chest completed on 10/14/18 with results revealing 5.6 cm left upper lobe lesion with chest wall and thoracic spine invasion, possible epidural extension of tumor. 2. 2.7 cm left hilar mass occluding the left lower lobe pulmonary artery branch and probably attenuating or occluding the inferior left pulmonary vein. 3. Left lower lobe pulmonary nodules possibly metastatic. 4. Negative for acute PE or thoracic aortic dissection. 5. Coronary and Aortic Atherosclerosis.  Most recent lab results (10/15/18) of CBC is as follows: all values are WNL except for RBC at 3.76, [name], [name], Glucose at 111.  He subsequently had an MRI of the T spine which showed Large cavitary mass arising in the superior aspect of the left hilum and extending into the posteromedial aspect of the left upper lobe invading the left side of the T5 and T6 vertebra and extending into the spinal canal with a slight mass effect upon the spinal cord. There is a slight  pathologic compression fracture of the T6 vertebral body. 2. Probable small metastasis in the T8 vertebral body. 3. Metastatic pulmonary nodules at the left lung base posterolaterally. 4. The mass destroys the posterior aspects of the left fifth and sixth ribs.  Patient has had a h/o localized bladder cancer s/p TURBT in 2009. 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   Interval History:   Kyle Foster returns today for management and evaluation of his Squamous Cell Carcinoma Lung Cancer and C4 of maintenance Pembrolizumab. The patient's last visit with [place] was on 03/30/2019. The pt reports that she is doing well overall.  The pt reports that his Marinol is not covered by Medicare. He is not seeing a PCP through the [place] anymore because his PCP retired.   The pt reports a little more aches and pains in his back than usual. He is seeing a pulmonologist in the near future and is getting a pulmonary function test. He denies diarrhea, belly pain, and skin changes and is not staying physically active.  Lab results today (04/20/2019) of CBC w/diff and CMP is as follows: all values are WNL except for RBC at 3.57, HGB at 12.0, HCT at 37.4, MCV at 104.8, PLT at 133k, lymphs abs at 0.6. 04/20/2019 CMP is pending 04/20/2019 Magnesium is pending  On review of systems, pt reports nausea, increased back aches and denies diarrhea, skin changes, belly pain,  and any other symptoms.   Past Medical History:  Diagnosis Date  . Bladder cancer ([place]) 10/2008  . CAD (coronary artery disease)   . Cancer of parotid gland ([place]) 12/2009   "squamous cell cancer attached to it; took the gland out"  . History of kidney stones   . Hyperlipidemia   . Hypertension   . Myocardial infarction ([place]) 06/2008  . Recurrent upper respiratory infection (URI)    09/01/11 saw PCP - Kathryne Eriksson , antibiotic  and prednisone   . Skin cancer    "cut & burned off arms,  hands, face, neck" (06/14/2018)    SURGICAL HISTORY: Past Surgical History:  Procedure Laterality Date  . CATARACT EXTRACTION W/ INTRAOCULAR LENS IMPLANT Right 12/2007  . CORONARY ANGIOPLASTY WITH STENT PLACEMENT  06/2008   "3 stents" (06/14/2018)  . CYSTOSCOPY W/ RETROGRADES  09/22/2011   Procedure: CYSTOSCOPY WITH RETROGRADE PYELOGRAM;  Surgeon: Bernestine Amass, MD;  Location: [facility];  Service: Urology;  Laterality: Left;  Cystoscopy left Retrograde Pyelogram      (c-arm)   . CYSTOSCOPY WITH BIOPSY  09/22/2011   Procedure: CYSTOSCOPY WITH BIOPSY;  Surgeon: Bernestine Amass, MD;  Location: [facility];  Service: Urology;  Laterality: N/A;   Biopsy  . EXCISIONAL HEMORRHOIDECTOMY  ~ 2006  . EYE SURGERY Left 04/2011   "reconstruction; gold weight in eye lid " (06/14/2018)  . FOOT NEUROMA SURGERY Left   . INGUINAL HERNIA REPAIR Right 02/2018  . IR IMAGING GUIDED PORT INSERTION  11/23/2018  . NM MYOCAR PERF WALL MOTION  07/11/2008   MILD ISCHEMIA IN THE BASL INFERIOR, MID INFERIOR & APICAL INFERIOR REGIONS  . 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 SKIN CANCER EXCISION     "arms, hands, face, neck" (06/14/2018)  . TRANSURETHRAL RESECTION OF BLADDER TUMOR WITH GYRUS (TURBT-GYRUS)  2010    SOCIAL HISTORY: Social History   Socioeconomic History  . Marital status: Married    Spouse name: Not on file  . Number of children: Not on file  . Years of education: Not on file  . Highest education level: Not on file  Occupational History  . Not on file  Social Needs  . Financial resource strain: Not on file  . Food insecurity    Worry: Not on file    Inability: Not on file  . Transportation needs    Medical: Not on file    Non-medical: Not on file  Tobacco Use  . Smoking status: Current Every Day Smoker    Packs/day: 1.00    Years: 52.00    Pack years: 52.00    Types: Cigarettes    Start date: 80  . Smokeless tobacco: Never Used  .  Tobacco comment: 03/21/19- smoking 10 cigs a day  Substance and Sexual Activity  . Alcohol use: Not Currently  . Drug use: Not Currently  . Sexual activity: Not on file  Lifestyle  . Physical activity    Days per week: Not on file    Minutes per session: Not on file  . Stress: Not on file  Relationships  . Social Herbalist on phone: Not on file    Gets together: Not on file    Attends religious service: Not on file    Active member of club or organization: Not on file    Attends meetings of clubs or organizations: Not on file  Relationship status: Not on file  . Intimate partner violence    Fear of current or ex partner: Not on file    Emotionally abused: Not on file    Physically abused: Not on file    Forced sexual activity: Not on file  Other Topics Concern  . Not on file  Social History Narrative  . Not on file    FAMILY HISTORY: Family History  Problem Relation Age of Onset  . Heart attack Mother 4  . Cancer Mother   . Stroke Father 74  . Brain cancer Brother     ALLERGIES:  is allergic to codeine.  MEDICATIONS:  Current Outpatient Medications  Medication Sig Dispense Refill  . albuterol (VENTOLIN HFA) 108 (90 Base) MCG/ACT inhaler Inhale 1-2 puffs into the lungs every 6 (six) hours as needed for wheezing or shortness of breath. 1 Inhaler 2  . atorvastatin (LIPITOR) 80 MG tablet Take 1 tablet (80 mg total) by mouth daily at 6 PM. 90 tablet 3  . calcium carbonate (TUMS - DOSED IN MG ELEMENTAL CALCIUM) 500 MG chewable tablet Chew 1 tablet (200 mg of elemental calcium total) by mouth 3 (three) times daily with meals. 30 tablet 3  . Cholecalciferol (VITAMIN D) 2000 UNITS tablet Take 2,000 Units by mouth 2 (two) times daily.    . clopidogrel (PLAVIX) 75 MG tablet Take 1 tablet (75 mg total) by mouth daily. 90 tablet 3  . dexamethasone (DECADRON) 2 MG tablet Take 1 tablet (2 mg total) by mouth daily with breakfast. 10 tablet 0  . dexamethasone (DECADRON) 4  MG tablet Take 2 tablets by mouth once a day on the day after chemotherapy and then take 2 tablets two times a day for 2 days. Take with food. 30 tablet 1  . dronabinol (MARINOL) 5 MG capsule Take 1 capsule (5 mg total) by mouth 2 (two) times daily before a meal. 60 capsule 0  . fentaNYL (DURAGESIC) 25 MCG/HR Place 1 patch onto the skin every 3 (three) days. 10 patch 0  . gabapentin (NEURONTIN) 300 MG capsule Take 1 capsule (300 mg total) by mouth 3 (three) times daily. 90 capsule 1  . Hypromellose (ARTIFICIAL TEARS OP) Place 1 drop into both eyes at bedtime.     . lidocaine-prilocaine (EMLA) cream Apply 1 application topically as needed. 30 g 1  . LORazepam (ATIVAN) 0.5 MG tablet Take 1 tablet (0.5 mg total) by mouth every 6 (six) hours as needed (Nausea or vomiting). 30 tablet 0  . magnesium oxide (MAG-OX) 400 MG tablet TAKE 1 TABLET BY MOUTH EVERY DAY 30 tablet 2  . metoprolol tartrate (LOPRESSOR) 25 MG tablet Take 1 tablet (25 mg total) by mouth 2 (two) times daily. 180 tablet 3  . morphine (MSIR) 15 MG tablet Take 1-2 tablets (15-30 mg total) by mouth every 4 (four) hours as needed for moderate pain or severe pain. 120 tablet 0  . ondansetron (ZOFRAN) 8 MG tablet Take 1 tablet (8 mg total) by mouth every 8 (eight) hours as needed for nausea or vomiting. 30 tablet 1  . pantoprazole (PROTONIX) 40 MG tablet Take 1 tablet (40 mg total) by mouth daily. 90 tablet 3  . prochlorperazine (COMPAZINE) 10 MG tablet TAKE 1 TABLET BY MOUTH EVERY 6 HOURS AS NEEDED FOR NAUSEA AND VOMITING 30 tablet 1  . umeclidinium bromide (INCRUSE ELLIPTA) 62.5 MCG/INH AEPB Inhale 1 puff into the lungs daily. 4 each 0  . umeclidinium bromide (INCRUSE ELLIPTA) 62.5 MCG/INH AEPB  Inhale 1 puff into the lungs daily. 1 each 3   No current facility-administered medications for this visit.     REVIEW OF SYSTEMS:    A 10+ POINT REVIEW OF SYSTEMS WAS OBTAINED including neurology, dermatology, psychiatry, cardiac, respiratory,  lymph, extremities, GI, GU, Musculoskeletal, constitutional, breasts, reproductive, HEENT.  All pertinent positives are noted in the HPI.  All others are negative.   PHYSICAL EXAMINATION: ECOG PERFORMANCE STATUS: 2 - Symptomatic, <50% confined to bed  Vitals:   04/20/19 0857  BP: 115/63  Pulse: 63  Resp: 18  Temp: 98.3 F (36.8 C)  SpO2: 100%   Filed Weights   04/20/19 0857  Weight: 122 lb 6.4 oz (55.5 kg)   .Body mass index is 17.07 kg/m.  GENERAL:alert, in no acute distress and comfortable SKIN: no acute rashes, no significant lesions EYES: conjunctiva are pink and non-injected, sclera anicteric OROPHARYNX: MMM, no exudates, no oropharyngeal erythema or ulceration NECK: supple, no JVD LYMPH:  no palpable lymphadenopathy in the cervical, axillary or inguinal regions LUNGS: clear to auscultation b/l with normal respiratory effort HEART: regular rate & rhythm ABDOMEN:  normoactive bowel sounds , non tender, not distended. No palpable hepatosplenomegaly.  Extremity: no pedal edema PSYCH: alert & oriented x 3 with fluent speech NEURO: no focal motor/sensory deficits   LABORATORY DATA:  I have reviewed the data as listed  . CBC Latest Ref Rng & Units 04/20/2019 03/30/2019 03/09/2019  WBC 4.0 - 10.5 K/uL 7.9 5.2 12.7(H)  Hemoglobin 13.0 - 17.0 g/dL 12.0(L) 11.8(L) 10.9(L)  Hematocrit 39.0 - 52.0 % 37.4(L) 35.8(L) 33.9(L)  Platelets 150 - 400 K/uL 133(L) 191 102(L)    . CMP Latest Ref Rng & Units 03/30/2019 03/09/2019 02/16/2019  Glucose 70 - 99 mg/dL 101(H) 87 90  BUN 8 - 23 mg/dL 18 17 16   Creatinine 0.61 - 1.24 mg/dL 0.91 0.81 0.77  Sodium 135 - 145 mmol/L 144 141 141  Potassium 3.5 - 5.1 mmol/L 4.3 4.5 4.2  Chloride 98 - 111 mmol/L 109 108 109  CO2 22 - 32 mmol/L 25 25 25   Calcium 8.9 - 10.3 mg/dL 9.4 8.9 8.5(L)  Total Protein 6.5 - 8.1 g/dL 6.6 6.5 6.1(L)  Total Bilirubin 0.3 - 1.2 mg/dL 0.6 0.6 0.5  Alkaline Phos 38 - 126 U/L 67 97 94  AST 15 - 41 U/L 15 13(L) 13(L)   ALT 0 - 44 U/L 11 8 8         RADIOGRAPHIC STUDIES: I have personally reviewed the radiological images as listed and agreed with the findings in the report. No results found.   ASSESSMENT & PLAN:  73 y.o. male with  1. 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 10/19/18 Lung biopsy revealed Squamous Cell carcinoma S/p Palliative RT to LUL lung mass with 30Gy in 10 fractions between 10/22/18 and 11/03/18, due to impending cord compression  10/19/18 PDL-1 status report found 30% PDL-1 tumor proportion  11/06/18 MRI Brain revealed No intracranial metastatic disease identified. 2. Mild chronic microvascular ischemic changes and volume loss of the brain.  11/10/18 PET/CT revealed Locally advanced left lung mass, as detailed previously. 2. Left perihilar direct extension versus nodal metastasis. 3. No findings of hypermetabolic distant metastasis. 4. No findings of extrathoracic hypermetabolic metastasis.  02/09/19 CT Chest and Abdomen revealed "Today's study demonstrates a positive response to therapy with slight regression of the  primary lesion in the superior segment of the left lower lobe which again demonstrates direct invasion of the adjacent posteromedial left chest wall and vertebral column, as detailed above. 2. However, there is a new hypovascular lesion in segment 7 of the liver. This is nonspecific and too small to characterize, but given the development compared to prior studies, this is concerning for potential metastasis. This could be definitively characterized with MRI of the abdomen with and without IV gadolinium if clinically appropriate. 3. 5 mm nonobstructive calculus in the right renal collecting system. 4. Aortic atherosclerosis, in addition to 3 vessel coronary artery disease. Assessment for potential risk factor modification, dietary therapy or  pharmacologic therapy may be warranted, if clinically Indicated. 5. There are calcifications of the aortic valve. Echocardiographic correlation for evaluation of potential valvular dysfunction may be warranted if clinically indicated. 6. Additional incidental findings, as above." Discussed that I suspect that the liver finding is likely a cyst, as progression through treatment would be unexpected. Nevertheless, will monitor this finding over time with future scans.  S/p 5 cycles of Carboplatin AUC of 5, 178m/m2 Taxol, and Pembrolizumab with G-CSF support completed on 02/16/19.  03/08/19 ECHO which revealed LV EF of 55-60%  2. 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 Smoker 4. 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  Has regularly followed up with ENT Dr. JFenton Malling6. Cancer related pain   PLAN -Discussed pt labwork today, 04/20/2019 -The pt has no prohibitive toxicities from continuing C4 maintenance Pembrolizumab at this time.  -Will plan to repeat imaging every 4 months -Rx Marinol (paper) -Will see the pt back in 3 weeks   Plz schedule next 2 treatments with Pembrolizumab per orders with labs and MD visit   All of the patients questions were answered with apparent satisfaction. The patient knows to call the clinic with any problems, questions or concerns.  The total time spent in the appt was 15 minutes and more than 50% was on counseling and direct patient cares.  GSullivan LoneMD MS AAHIVMS SRegional Health Custer HospitalCMidmichigan Medical Center-ClareHematology/Oncology Physician CGila [facility] (Office):       [phone](Work cell):  [phone](Fax):           [phone] 04/20/2019 9:17 AM  I, EDe Burrs am acting as a scribe for Dr. KIrene Limbo .I have reviewed the above documentation for accuracy and  completeness, and I agree with the above. .Brunetta GeneraMD

## 2019-04-19 ENCOUNTER — Other Ambulatory Visit: Payer: Self-pay | Admitting: Hematology

## 2019-04-20 ENCOUNTER — Inpatient Hospital Stay: Payer: Medicare Other | Attending: Hematology

## 2019-04-20 ENCOUNTER — Inpatient Hospital Stay: Payer: Medicare Other

## 2019-04-20 ENCOUNTER — Inpatient Hospital Stay (HOSPITAL_BASED_OUTPATIENT_CLINIC_OR_DEPARTMENT_OTHER): Payer: Medicare Other | Admitting: Hematology

## 2019-04-20 ENCOUNTER — Inpatient Hospital Stay: Payer: Medicare Other | Admitting: Nutrition

## 2019-04-20 ENCOUNTER — Other Ambulatory Visit: Payer: Self-pay

## 2019-04-20 ENCOUNTER — Telehealth: Payer: Self-pay | Admitting: Hematology

## 2019-04-20 VITALS — BP 115/63 | HR 63 | Temp 98.3°F | Resp 18 | Ht 71.0 in | Wt 122.4 lb

## 2019-04-20 DIAGNOSIS — C3412 Malignant neoplasm of upper lobe, left bronchus or lung: Secondary | ICD-10-CM

## 2019-04-20 DIAGNOSIS — C3492 Malignant neoplasm of unspecified part of left bronchus or lung: Secondary | ICD-10-CM

## 2019-04-20 DIAGNOSIS — Z9861 Coronary angioplasty status: Secondary | ICD-10-CM | POA: Diagnosis not present

## 2019-04-20 DIAGNOSIS — C7951 Secondary malignant neoplasm of bone: Secondary | ICD-10-CM

## 2019-04-20 DIAGNOSIS — I10 Essential (primary) hypertension: Secondary | ICD-10-CM | POA: Insufficient documentation

## 2019-04-20 DIAGNOSIS — I252 Old myocardial infarction: Secondary | ICD-10-CM | POA: Insufficient documentation

## 2019-04-20 DIAGNOSIS — E785 Hyperlipidemia, unspecified: Secondary | ICD-10-CM | POA: Diagnosis not present

## 2019-04-20 DIAGNOSIS — F17210 Nicotine dependence, cigarettes, uncomplicated: Secondary | ICD-10-CM | POA: Diagnosis not present

## 2019-04-20 DIAGNOSIS — C799 Secondary malignant neoplasm of unspecified site: Secondary | ICD-10-CM

## 2019-04-20 DIAGNOSIS — Z85828 Personal history of other malignant neoplasm of skin: Secondary | ICD-10-CM | POA: Insufficient documentation

## 2019-04-20 DIAGNOSIS — Z79899 Other long term (current) drug therapy: Secondary | ICD-10-CM | POA: Diagnosis not present

## 2019-04-20 DIAGNOSIS — Z8551 Personal history of malignant neoplasm of bladder: Secondary | ICD-10-CM | POA: Insufficient documentation

## 2019-04-20 DIAGNOSIS — I2510 Atherosclerotic heart disease of native coronary artery without angina pectoris: Secondary | ICD-10-CM | POA: Diagnosis not present

## 2019-04-20 DIAGNOSIS — Z85818 Personal history of malignant neoplasm of other sites of lip, oral cavity, and pharynx: Secondary | ICD-10-CM | POA: Insufficient documentation

## 2019-04-20 DIAGNOSIS — Z5112 Encounter for antineoplastic immunotherapy: Secondary | ICD-10-CM | POA: Diagnosis not present

## 2019-04-20 DIAGNOSIS — Z7189 Other specified counseling: Secondary | ICD-10-CM

## 2019-04-20 DIAGNOSIS — G893 Neoplasm related pain (acute) (chronic): Secondary | ICD-10-CM | POA: Insufficient documentation

## 2019-04-20 LAB — CMP (CANCER CENTER ONLY)
ALT: 20 U/L (ref 0–44)
AST: 21 U/L (ref 15–41)
Albumin: 3.9 g/dL (ref 3.5–5.0)
Alkaline Phosphatase: 57 U/L (ref 38–126)
Anion gap: 10 (ref 5–15)
BUN: 24 mg/dL — ABNORMAL HIGH (ref 8–23)
CO2: 24 mmol/L (ref 22–32)
Calcium: 9.2 mg/dL (ref 8.9–10.3)
Chloride: 108 mmol/L (ref 98–111)
Creatinine: 0.88 mg/dL (ref 0.61–1.24)
GFR, Est AFR Am: >60 mL/min (ref >60)
GFR, Estimated: >60 mL/min (ref >60)
Glucose, Bld: 91 mg/dL (ref 70–99)
Potassium: 4.5 mmol/L (ref 3.5–5.1)
Sodium: 142 mmol/L (ref 135–145)
Total Bilirubin: 0.6 mg/dL (ref 0.3–1.2)
Total Protein: 6.6 g/dL (ref 6.5–8.1)

## 2019-04-20 LAB — CBC WITH DIFFERENTIAL/PLATELET
Abs Immature Granulocytes: 0.02 10*3/uL (ref 0.00–0.07)
Basophils Absolute: 0.1 10*3/uL (ref 0.0–0.1)
Basophils Relative: 1 %
Eosinophils Absolute: 0.4 10*3/uL (ref 0.0–0.5)
Eosinophils Relative: 5 %
HCT: 37.4 % — ABNORMAL LOW (ref 39.0–52.0)
Hemoglobin: 12 g/dL — ABNORMAL LOW (ref 13.0–17.0)
Immature Granulocytes: 0 %
Lymphocytes Relative: 7 %
Lymphs Abs: 0.6 10*3/uL — ABNORMAL LOW (ref 0.7–4.0)
MCH: 33.6 pg (ref 26.0–34.0)
MCHC: 32.1 g/dL (ref 30.0–36.0)
MCV: 104.8 fL — ABNORMAL HIGH (ref 80.0–100.0)
Monocytes Absolute: 0.8 10*3/uL (ref 0.1–1.0)
Monocytes Relative: 10 %
Neutro Abs: 6.1 10*3/uL (ref 1.7–7.7)
Neutrophils Relative %: 77 %
Platelets: 133 10*3/uL — ABNORMAL LOW (ref 150–400)
RBC: 3.57 MIL/uL — ABNORMAL LOW (ref 4.22–5.81)
RDW: 13.4 % (ref 11.5–15.5)
WBC: 7.9 10*3/uL (ref 4.0–10.5)
nRBC: 0 % (ref 0.0–0.2)

## 2019-04-20 LAB — MAGNESIUM: Magnesium: 2.1 mg/dL (ref 1.7–2.4)

## 2019-04-20 MED ORDER — HEPARIN SOD (PORK) LOCK FLUSH 100 UNIT/ML IV SOLN
500.0000 [IU] | Freq: Once | INTRAVENOUS | Status: AC | PRN
  Administered 2019-04-20: 500 [IU]
  Filled 2019-04-20: qty 5

## 2019-04-20 MED ORDER — SODIUM CHLORIDE 0.9% FLUSH
10.0000 mL | INTRAVENOUS | Status: DC | PRN
  Administered 2019-04-20: 10 mL
  Filled 2019-04-20: qty 10

## 2019-04-20 MED ORDER — DIPHENHYDRAMINE HCL 50 MG/ML IJ SOLN
25.0000 mg | Freq: Once | INTRAMUSCULAR | Status: AC
  Administered 2019-04-20: 10:00:00 25 mg via INTRAVENOUS

## 2019-04-20 MED ORDER — FAMOTIDINE IN NACL 20-0.9 MG/50ML-% IV SOLN
20.0000 mg | Freq: Once | INTRAVENOUS | Status: AC
  Administered 2019-04-20: 20 mg via INTRAVENOUS

## 2019-04-20 MED ORDER — DIPHENHYDRAMINE HCL 50 MG/ML IJ SOLN
INTRAMUSCULAR | Status: AC
  Filled 2019-04-20: qty 1

## 2019-04-20 MED ORDER — DENOSUMAB 120 MG/1.7ML ~~LOC~~ SOLN
SUBCUTANEOUS | Status: AC
  Filled 2019-04-20: qty 1.7

## 2019-04-20 MED ORDER — DENOSUMAB 120 MG/1.7ML ~~LOC~~ SOLN
120.0000 mg | Freq: Once | SUBCUTANEOUS | Status: AC
  Administered 2019-04-20: 120 mg via SUBCUTANEOUS

## 2019-04-20 MED ORDER — DRONABINOL 5 MG PO CAPS: 5.0000 mg | ORAL_CAPSULE | Freq: Two times a day (BID) | ORAL | 0 refills | Status: DC

## 2019-04-20 MED ORDER — SODIUM CHLORIDE 0.9 % IV SOLN
200.0000 mg | Freq: Once | INTRAVENOUS | Status: AC
  Administered 2019-04-20: 11:00:00 200 mg via INTRAVENOUS
  Filled 2019-04-20: qty 8

## 2019-04-20 MED ORDER — FAMOTIDINE IN NACL 20-0.9 MG/50ML-% IV SOLN
INTRAVENOUS | Status: AC
  Filled 2019-04-20: qty 50

## 2019-04-20 MED ORDER — SODIUM CHLORIDE 0.9 % IV SOLN
Freq: Once | INTRAVENOUS | Status: AC
  Administered 2019-04-20: 10:00:00 via INTRAVENOUS
  Filled 2019-04-20: qty 250

## 2019-04-20 NOTE — Telephone Encounter (Signed)
Scheduled appt per 8/12 los.

## 2019-04-20 NOTE — Patient Instructions (Addendum)
[facility] Discharge Instructions for Patients Receiving Chemotherapy  Today you received the following chemotherapy agent: Keytruda  To help prevent nausea and vomiting after your treatment, we encourage you to take your nausea medication as directed by your MD.   If you develop nausea and vomiting that is not controlled by your nausea medication, call the clinic.   BELOW ARE SYMPTOMS THAT SHOULD BE REPORTED IMMEDIATELY:  *FEVER GREATER THAN 100.5 F  *CHILLS WITH OR WITHOUT FEVER  NAUSEA AND VOMITING THAT IS NOT CONTROLLED WITH YOUR NAUSEA MEDICATION  *UNUSUAL SHORTNESS OF BREATH  *UNUSUAL BRUISING OR BLEEDING  TENDERNESS IN MOUTH AND THROAT WITH OR WITHOUT PRESENCE OF ULCERS  *URINARY PROBLEMS  *BOWEL PROBLEMS  UNUSUAL RASH Items with * indicate a potential emergency and should be followed up as soon as possible.  Feel free to call the clinic should you have any questions or concerns. The clinic phone number is [phone].  Please show the [facility] at check-in to the Emergency Department and triage nurse.    Denosumab injection(Xgeva) What is this medicine? DENOSUMAB (den oh sue mab) slows bone breakdown. Prolia is used to treat osteoporosis in women after menopause and in men, and in people who are taking corticosteroids for 6 months or more. Delton See is used to treat a high calcium level due to cancer and to prevent bone fractures and other bone problems caused by multiple myeloma or cancer bone metastases. Delton See is also used to treat giant cell tumor of the bone. This medicine may be used for other purposes; ask your health care provider or pharmacist if you have questions. COMMON BRAND NAME(S): Prolia, XGEVA What should I tell my health care provider before I take this medicine? They need to know if you have any of these conditions:  dental disease  having surgery or tooth extraction  infection  kidney disease  low levels of calcium or  Vitamin D in the blood  malnutrition  on hemodialysis  skin conditions or sensitivity  thyroid or parathyroid disease  an unusual reaction to denosumab, other medicines, foods, dyes, or preservatives  pregnant or trying to get pregnant  breast-feeding How should I use this medicine? This medicine is for injection under the skin. It is given by a health care professional in a hospital or clinic setting. A special MedGuide will be given to you before each treatment. Be sure to read this information carefully each time. For Prolia, talk to your pediatrician regarding the use of this medicine in children. Special care may be needed. For Delton See, talk to your pediatrician regarding the use of this medicine in children. While this drug may be prescribed for children as young as 13 years for selected conditions, precautions do apply. Overdosage: If you think you have taken too much of this medicine contact a poison control center or emergency room at once. NOTE: This medicine is only for you. Do not share this medicine with others. What if I miss a dose? It is important not to miss your dose. Call your doctor or health care professional if you are unable to keep an appointment. What may interact with this medicine? Do not take this medicine with any of the following medications:  other medicines containing denosumab This medicine may also interact with the following medications:  medicines that lower your chance of fighting infection  steroid medicines like prednisone or cortisone This list may not describe all possible interactions. Give your health care provider a list of  all the medicines, herbs, non-prescription drugs, or dietary supplements you use. Also tell them if you smoke, drink alcohol, or use illegal drugs. Some items may interact with your medicine. What should I watch for while using this medicine? Visit your doctor or health care professional for regular checks on your  progress. Your doctor or health care professional may order blood tests and other tests to see how you are doing. Call your doctor or health care professional for advice if you get a fever, chills or sore throat, or other symptoms of a cold or flu. Do not treat yourself. This drug may decrease your body's ability to fight infection. Try to avoid being around people who are sick. You should make sure you get enough calcium and vitamin D while you are taking this medicine, unless your doctor tells you not to. Discuss the foods you eat and the vitamins you take with your health care professional. See your dentist regularly. Brush and floss your teeth as directed. Before you have any dental work done, tell your dentist you are receiving this medicine. Do not become pregnant while taking this medicine or for 5 months after stopping it. Talk with your doctor or health care professional about your birth control options while taking this medicine. Women should inform their doctor if they wish to become pregnant or think they might be pregnant. There is a potential for serious side effects to an unborn child. Talk to your health care professional or pharmacist for more information. What side effects may I notice from receiving this medicine? Side effects that you should report to your doctor or health care professional as soon as possible:  allergic reactions like skin rash, itching or hives, swelling of the face, lips, or tongue  bone pain  breathing problems  dizziness  jaw pain, especially after dental work  redness, blistering, peeling of the skin  signs and symptoms of infection like fever or chills; cough; sore throat; pain or trouble passing urine  signs of low calcium like fast heartbeat, muscle cramps or muscle pain; pain, tingling, numbness in the hands or feet; seizures  unusual bleeding or bruising  unusually weak or tired Side effects that usually do not require medical attention  (report to your doctor or health care professional if they continue or are bothersome):  constipation  diarrhea  headache  joint pain  loss of appetite  muscle pain  runny nose  tiredness  upset stomach This list may not describe all possible side effects. Call your doctor for medical advice about side effects. You may report side effects to FDA at 1-800-FDA-1088. Where should I keep my medicine? This medicine is only given in a clinic, doctor's office, or other health care setting and will not be stored at home. NOTE: This sheet is a summary. It may not cover all possible information. If you have questions about this medicine, talk to your doctor, pharmacist, or health care provider.  2020 Elsevier/Gold Standard (2018-01-01 16:10:44)

## 2019-04-20 NOTE — Patient Instructions (Signed)

## 2019-04-20 NOTE — Progress Notes (Signed)
Nutrition follow-up completed with patient in the infusion room.  Patient is being treated for metastatic lung cancer and is receiving Keytruda. Current weight documented as 122.4 pounds August 12.  This is increased from 121.4 pounds. Patient remains underweight. Labs were reviewed.  Noted glucose 101. Patient reports he is no longer having difficulty swallowing. He denies nausea, vomiting, constipation, and diarrhea. He continues to have very poor appetite and early satiety. Reports he tried [employer] and is tolerating it.  Nutrition diagnosis: Unintended weight loss is stable.  Intervention: Educated patient on strategies for improving taste alterations. Encouraged small frequent meals and snacks and encouraged patient to set a timer or an alarm to remind him to eat. Provided handouts on poor appetite and taste alterations. Encourage patient to make milkshakes using Carnation breakfast or another oral nutrition supplement and ice cream. Provided samples of Carnation breakfast essentials. Patient is agreeable. Teach back method used.  Monitoring, evaluation, goals: Patient will tolerate increased calories and protein to minimize weight loss and improve quality of life.  Next visit: Wednesday, September 23 during infusion.  **Disclaimer: This note was dictated with voice recognition software. Similar sounding words can inadvertently be transcribed and this note may contain transcription errors which may not have been corrected upon publication of note.**

## 2019-04-21 ENCOUNTER — Other Ambulatory Visit: Payer: Self-pay | Admitting: Pulmonary Disease

## 2019-04-27 ENCOUNTER — Other Ambulatory Visit: Payer: Self-pay

## 2019-04-27 ENCOUNTER — Ambulatory Visit (INDEPENDENT_AMBULATORY_CARE_PROVIDER_SITE_OTHER): Payer: Medicare Other | Admitting: Physician Assistant

## 2019-04-27 ENCOUNTER — Encounter: Payer: Self-pay | Admitting: Physician Assistant

## 2019-04-27 VITALS — BP 100/68 | HR 68 | Temp 98.5°F | Resp 16 | Ht 68.0 in | Wt 125.0 lb

## 2019-04-27 DIAGNOSIS — J432 Centrilobular emphysema: Secondary | ICD-10-CM

## 2019-04-27 DIAGNOSIS — Z9861 Coronary angioplasty status: Secondary | ICD-10-CM | POA: Diagnosis not present

## 2019-04-27 DIAGNOSIS — I10 Essential (primary) hypertension: Secondary | ICD-10-CM | POA: Diagnosis not present

## 2019-04-27 DIAGNOSIS — L570 Actinic keratosis: Secondary | ICD-10-CM

## 2019-04-27 DIAGNOSIS — I2510 Atherosclerotic heart disease of native coronary artery without angina pectoris: Secondary | ICD-10-CM

## 2019-04-27 DIAGNOSIS — Z85828 Personal history of other malignant neoplasm of skin: Secondary | ICD-10-CM | POA: Diagnosis not present

## 2019-04-27 DIAGNOSIS — C3492 Malignant neoplasm of unspecified part of left bronchus or lung: Secondary | ICD-10-CM | POA: Diagnosis not present
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
## 2019-04-27 NOTE — Patient Instructions (Signed)
It was very nice meeting you today. Welcome to [facility]!  We are getting your records from the [place] so we can see what you are due for (if anything). Please follow-up with your specialists as scheduled.  You will be contacted for assessment with one of our dermatologists.   Looking forward to taking care of you!

## 2019-04-29 ENCOUNTER — Other Ambulatory Visit (HOSPITAL_COMMUNITY)
Admission: RE | Admit: 2019-04-29 | Discharge: 2019-04-29 | Disposition: A | Discharge status: Home / Self Care | Payer: Medicare Other | Source: Ambulatory Visit | Attending: Pulmonary Disease | Admitting: Pulmonary Disease

## 2019-04-29 DIAGNOSIS — Z01812 Encounter for preprocedural laboratory examination: Secondary | ICD-10-CM | POA: Insufficient documentation

## 2019-04-29 DIAGNOSIS — Z20828 Contact with and (suspected) exposure to other viral communicable diseases: Secondary | ICD-10-CM | POA: Insufficient documentation

## 2019-04-29 LAB — SARS CORONAVIRUS 2 (TAT 6-24 HRS): SARS Coronavirus 2: NEGATIVE

## 2019-05-02 ENCOUNTER — Encounter: Payer: Self-pay | Admitting: Pulmonary Disease

## 2019-05-02 ENCOUNTER — Other Ambulatory Visit: Payer: Self-pay

## 2019-05-02 ENCOUNTER — Ambulatory Visit (INDEPENDENT_AMBULATORY_CARE_PROVIDER_SITE_OTHER): Payer: Medicare Other | Admitting: Pulmonary Disease

## 2019-05-02 ENCOUNTER — Telehealth: Payer: Self-pay | Admitting: *Deleted

## 2019-05-02 VITALS — BP 112/58 | HR 71 | Temp 98.7°F | Ht 69.0 in | Wt 126.4 lb

## 2019-05-02 DIAGNOSIS — J449 Chronic obstructive pulmonary disease, unspecified: Secondary | ICD-10-CM | POA: Diagnosis not present

## 2019-05-02 DIAGNOSIS — J439 Emphysema, unspecified: Secondary | ICD-10-CM

## 2019-05-02 DIAGNOSIS — J4489 Other specified chronic obstructive pulmonary disease: Secondary | ICD-10-CM

## 2019-05-02 DIAGNOSIS — F17210 Nicotine dependence, cigarettes, uncomplicated: Secondary | ICD-10-CM | POA: Diagnosis not present

## 2019-05-02 DIAGNOSIS — Z23 Encounter for immunization: Secondary | ICD-10-CM | POA: Diagnosis not present

## 2019-05-02 LAB — PULMONARY FUNCTION TEST
DL/VA % pred: 73 %
DL/VA: 2.99 ml/min/mmHg/L
DLCO cor % pred: 58 %
DLCO cor: 14.49 ml/min/mmHg
DLCO unc % pred: 53 %
DLCO unc: 13.3 ml/min/mmHg
FEF 25-75 Post: 1.34 L/sec
FEF 25-75 Pre: 0.84 L/sec
FEF2575-%Change-Post: 60 %
FEF2575-%Pred-Post: 57 %
FEF2575-%Pred-Pre: 35 %
FEV1-%Change-Post: 16 %
FEV1-%Pred-Post: 59 %
FEV1-%Pred-Pre: 50 %
FEV1-Post: 1.83 L
FEV1-Pre: 1.57 L
FEV1FVC-%Change-Post: 13 %
FEV1FVC-%Pred-Pre: 83 %
FEV6-%Change-Post: 3 %
FEV6-%Pred-Post: 66 %
FEV6-%Pred-Pre: 64 %
FEV6-Post: 2.65 L
FEV6-Pre: 2.55 L
FEV6FVC-%Change-Post: 0 %
FEV6FVC-%Pred-Post: 106 %
FEV6FVC-%Pred-Pre: 105 %
FVC-%Change-Post: 2 %
FVC-%Pred-Post: 62 %
FVC-%Pred-Pre: 60 %
FVC-Post: 2.65 L
FVC-Pre: 2.57 L
Post FEV1/FVC ratio: 69 %
Post FEV6/FVC ratio: 100 %
Pre FEV1/FVC ratio: 61 %
Pre FEV6/FVC Ratio: 99 %
RV % pred: 219 %
RV: 5.3 L
TLC % pred: 108 %
TLC: 7.44 L

## 2019-05-02 MED ORDER — TRELEGY ELLIPTA 100-62.5-25 MCG/INH IN AEPB: 1.0000 | INHALATION_SPRAY | Freq: Every day | RESPIRATORY_TRACT | 6 refills | Status: DC

## 2019-05-02 MED ORDER — TRELEGY ELLIPTA 100-62.5-25 MCG/INH IN AEPB: 1.0000 | INHALATION_SPRAY | Freq: Every day | RESPIRATORY_TRACT | 0 refills | Status: DC

## 2019-05-02 NOTE — Patient Instructions (Signed)
COPD/Emphysema GOLD Class B  Medications ordered: --STOP Incruse inhaler --START Trelegy 1 puff daily Take this EVERYDAY. --CONTINUE albuterol as needed for wheezing or shortness of breath. Take this WHEN YOU HAVE SYMPTOMS OR BEFORE ACTIVITY.   Follow-up with me in 3 months   Smoking Tobacco Information, Adult Smoking tobacco can be harmful to your health. Tobacco contains a poisonous (toxic), colorless chemical called nicotine. Nicotine is addictive. It changes the brain and can make it hard to stop smoking. Tobacco also has other toxic chemicals that can hurt your body and raise your risk of many cancers. How can smoking tobacco affect me? Smoking tobacco puts you at risk for:  Cancer. Smoking is most commonly associated with lung cancer, but can also lead to cancer in other parts of the body.  Chronic obstructive pulmonary disease (COPD). This is a long-term lung condition that makes it hard to breathe. It also gets worse over time.  High blood pressure (hypertension), heart disease, stroke, or heart attack.  Lung infections, such as pneumonia.  Cataracts. This is when the lenses in the eyes become clouded.  Digestive problems. This may include peptic ulcers, heartburn, and gastroesophageal reflux disease (GERD).  Oral health problems, such as gum disease and tooth loss.  Loss of taste and smell. Smoking can affect your appearance by causing:  Wrinkles.  Yellow or stained teeth, fingers, and fingernails. Smoking tobacco can also affect your social life, because:  It may be challenging to find places to smoke when away from home. Many workplaces, [employer], hotels, and public places are tobacco-free.  Smoking is expensive. This is due to the cost of tobacco and the long-term costs of treating health problems from smoking.  Secondhand smoke may affect those around you. Secondhand smoke can cause lung cancer, breathing problems, and heart disease. Children of smokers have  a higher risk for: ? Sudden infant death syndrome (SIDS). ? Ear infections. ? Lung infections. If you currently smoke tobacco, quitting now can help you:  Lead a longer and healthier life.  Look, smell, breathe, and feel better over time.  Save money.  Protect others from the harms of secondhand smoke. What actions can I take to prevent health problems? Quit smoking   Do not start smoking. Quit if you already do.  Make a plan to quit smoking and commit to it. Look for programs to help you and ask your health care provider for recommendations and ideas.  Set a date and write down all the reasons you want to quit.  Let your friends and family know you are quitting so they can help and support you. Consider finding friends who also want to quit. It can be easier to quit with someone else, so that you can support each other.  Talk with your health care provider about using nicotine replacement medicines to help you quit, such as gum, lozenges, patches, sprays, or pills.  Do not replace cigarette smoking with electronic cigarettes, which are commonly called e-cigarettes. The safety of e-cigarettes is not known, and some may contain harmful chemicals.  If you try to quit but return to smoking, stay positive. It is common to slip up when you first quit, so take it one day at a time.  Be prepared for cravings. When you feel the urge to smoke, chew gum or suck on hard candy. Lifestyle  Stay busy and take care of your body.  Drink enough fluid to keep your urine pale yellow.  Get plenty of exercise and eat  a healthy diet. This can help prevent weight gain after quitting.  Monitor your eating habits. Quitting smoking can cause you to have a larger appetite than when you smoke.  Find ways to relax. Go out with friends or family to a movie or a restaurant where people do not smoke.  Ask your health care provider about having regular tests (screenings) to check for cancer. This may  include blood tests, imaging tests, and other tests.  Find ways to manage your stress, such as meditation, yoga, or exercise. Where to find support To get support to quit smoking, consider:  Asking your health care provider for more information and resources.  Taking classes to learn more about quitting smoking.  Looking for local organizations that offer resources about quitting smoking.  Joining a support group for people who want to quit smoking in your local community.  Calling the smokefree.gov counselor helpline: 1-800-Quit-Now 731-151-8771) Where to find more information You may find more information about quitting smoking from:  HelpGuide.org: www.helpguide.org  https://hall.com/: smokefree.gov  American Lung Association: www.lung.org Contact a health care provider if you:  Have problems breathing.  Notice that your lips, nose, or fingers turn blue.  Have chest pain.  Are coughing up blood.  Feel faint or you pass out.  Have other health changes that cause you to worry. Summary  Smoking tobacco can negatively affect your health, the health of those around you, your finances, and your social life.  Do not start smoking. Quit if you already do. If you need help quitting, ask your health care provider.  Think about joining a support group for people who want to quit smoking in your local community. There are many effective programs that will help you to quit this behavior. This information is not intended to replace advice given to you by your health care provider. Make sure you discuss any questions you have with your health care provider. Document Released: 09/09/2016 Document Revised: 10/14/2017 Document Reviewed: 09/09/2016 Elsevier Patient Education  2020 [employer].

## 2019-05-02 NOTE — Telephone Encounter (Signed)
Per Dr. [name]: Kyle Foster with every other keytruda dose. Patient does not need appt for Xgeva on 8/26 (received last on 8/12). Appointment remained on schedule from earlier LOS in April. Contacted patient - spoke with spouse and explained Dr. Grier Mitts new directions and that patient will not have appt on 8/26 for Xgeva injection. She will inform patient.

## 2019-05-02 NOTE — Progress Notes (Signed)
Full PFT performed today. °
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
## 2019-05-04 ENCOUNTER — Inpatient Hospital Stay: Payer: Medicare Other

## 2019-05-11 ENCOUNTER — Inpatient Hospital Stay: Payer: Medicare Other | Attending: Hematology

## 2019-05-11 ENCOUNTER — Inpatient Hospital Stay (HOSPITAL_BASED_OUTPATIENT_CLINIC_OR_DEPARTMENT_OTHER): Payer: Medicare Other | Admitting: Hematology

## 2019-05-11 ENCOUNTER — Telehealth: Payer: Self-pay | Admitting: Hematology

## 2019-05-11 ENCOUNTER — Inpatient Hospital Stay: Payer: Medicare Other

## 2019-05-11 ENCOUNTER — Other Ambulatory Visit: Payer: Self-pay

## 2019-05-11 VITALS — BP 96/51 | HR 65 | Temp 98.9°F | Resp 18 | Ht 69.0 in | Wt 124.5 lb

## 2019-05-11 DIAGNOSIS — C7951 Secondary malignant neoplasm of bone: Secondary | ICD-10-CM

## 2019-05-11 DIAGNOSIS — Z8551 Personal history of malignant neoplasm of bladder: Secondary | ICD-10-CM | POA: Insufficient documentation

## 2019-05-11 DIAGNOSIS — C3492 Malignant neoplasm of unspecified part of left bronchus or lung: Secondary | ICD-10-CM | POA: Diagnosis not present

## 2019-05-11 DIAGNOSIS — Z79899 Other long term (current) drug therapy: Secondary | ICD-10-CM | POA: Insufficient documentation

## 2019-05-11 DIAGNOSIS — I2510 Atherosclerotic heart disease of native coronary artery without angina pectoris: Secondary | ICD-10-CM

## 2019-05-11 DIAGNOSIS — E785 Hyperlipidemia, unspecified: Secondary | ICD-10-CM | POA: Diagnosis not present

## 2019-05-11 DIAGNOSIS — F17210 Nicotine dependence, cigarettes, uncomplicated: Secondary | ICD-10-CM | POA: Insufficient documentation

## 2019-05-11 DIAGNOSIS — C3412 Malignant neoplasm of upper lobe, left bronchus or lung: Secondary | ICD-10-CM

## 2019-05-11 DIAGNOSIS — C799 Secondary malignant neoplasm of unspecified site: Secondary | ICD-10-CM

## 2019-05-11 DIAGNOSIS — I252 Old myocardial infarction: Secondary | ICD-10-CM | POA: Diagnosis not present

## 2019-05-11 DIAGNOSIS — Z7189 Other specified counseling: Secondary | ICD-10-CM

## 2019-05-11 DIAGNOSIS — F329 Major depressive disorder, single episode, unspecified: Secondary | ICD-10-CM | POA: Diagnosis not present

## 2019-05-11 DIAGNOSIS — Z5112 Encounter for antineoplastic immunotherapy: Secondary | ICD-10-CM | POA: Diagnosis not present

## 2019-05-11 DIAGNOSIS — I10 Essential (primary) hypertension: Secondary | ICD-10-CM | POA: Insufficient documentation

## 2019-05-11 DIAGNOSIS — Z9861 Coronary angioplasty status: Secondary | ICD-10-CM

## 2019-05-11 DIAGNOSIS — G893 Neoplasm related pain (acute) (chronic): Secondary | ICD-10-CM | POA: Insufficient documentation

## 2019-05-11 LAB — CBC WITH DIFFERENTIAL/PLATELET
Abs Immature Granulocytes: 0.02 10*3/uL (ref 0.00–0.07)
Basophils Absolute: 0 10*3/uL (ref 0.0–0.1)
Basophils Relative: 0 %
Eosinophils Absolute: 0.2 10*3/uL (ref 0.0–0.5)
Eosinophils Relative: 3 %
HCT: 34.7 % — ABNORMAL LOW (ref 39.0–52.0)
Hemoglobin: 11.5 g/dL — ABNORMAL LOW (ref 13.0–17.0)
Immature Granulocytes: 0 %
Lymphocytes Relative: 8 %
Lymphs Abs: 0.6 10*3/uL — ABNORMAL LOW (ref 0.7–4.0)
MCH: 34.3 pg — ABNORMAL HIGH (ref 26.0–34.0)
MCHC: 33.1 g/dL (ref 30.0–36.0)
MCV: 103.6 fL — ABNORMAL HIGH (ref 80.0–100.0)
Monocytes Absolute: 0.5 10*3/uL (ref 0.1–1.0)
Monocytes Relative: 8 %
Neutro Abs: 5.6 10*3/uL (ref 1.7–7.7)
Neutrophils Relative %: 81 %
Platelets: 128 10*3/uL — ABNORMAL LOW (ref 150–400)
RBC: 3.35 MIL/uL — ABNORMAL LOW (ref 4.22–5.81)
RDW: 12.2 % (ref 11.5–15.5)
WBC: 6.9 10*3/uL (ref 4.0–10.5)
nRBC: 0 % (ref 0.0–0.2)

## 2019-05-11 LAB — CMP (CANCER CENTER ONLY)
ALT: 14 U/L (ref 0–44)
AST: 17 U/L (ref 15–41)
Albumin: 3.7 g/dL (ref 3.5–5.0)
Alkaline Phosphatase: 51 U/L (ref 38–126)
Anion gap: 8 (ref 5–15)
BUN: 17 mg/dL (ref 8–23)
CO2: 25 mmol/L (ref 22–32)
Calcium: 8.8 mg/dL — ABNORMAL LOW (ref 8.9–10.3)
Chloride: 108 mmol/L (ref 98–111)
Creatinine: 0.86 mg/dL (ref 0.61–1.24)
GFR, Est AFR Am: >60 mL/min (ref >60)
GFR, Estimated: >60 mL/min (ref >60)
Glucose, Bld: 97 mg/dL (ref 70–99)
Potassium: 4.2 mmol/L (ref 3.5–5.1)
Sodium: 141 mmol/L (ref 135–145)
Total Bilirubin: 0.5 mg/dL (ref 0.3–1.2)
Total Protein: 6.1 g/dL — ABNORMAL LOW (ref 6.5–8.1)

## 2019-05-11 LAB — MAGNESIUM: Magnesium: 1.9 mg/dL (ref 1.7–2.4)

## 2019-05-11 MED ORDER — SODIUM CHLORIDE 0.9% FLUSH
10.0000 mL | INTRAVENOUS | Status: DC | PRN
  Administered 2019-05-11: 10 mL
  Filled 2019-05-11: qty 10

## 2019-05-11 MED ORDER — DIPHENHYDRAMINE HCL 50 MG/ML IJ SOLN
25.0000 mg | Freq: Once | INTRAMUSCULAR | Status: AC
  Administered 2019-05-11: 25 mg via INTRAVENOUS

## 2019-05-11 MED ORDER — SODIUM CHLORIDE 0.9% FLUSH
10.0000 mL | INTRAVENOUS | Status: DC | PRN
  Administered 2019-05-11: 12:00:00 10 mL
  Filled 2019-05-11: qty 10

## 2019-05-11 MED ORDER — FAMOTIDINE IN NACL 20-0.9 MG/50ML-% IV SOLN
INTRAVENOUS | Status: AC
  Filled 2019-05-11: qty 50

## 2019-05-11 MED ORDER — HEPARIN SOD (PORK) LOCK FLUSH 100 UNIT/ML IV SOLN
500.0000 [IU] | Freq: Once | INTRAVENOUS | Status: AC | PRN
  Administered 2019-05-11: 12:00:00 500 [IU]
  Filled 2019-05-11: qty 5

## 2019-05-11 MED ORDER — DIPHENHYDRAMINE HCL 50 MG/ML IJ SOLN
INTRAMUSCULAR | Status: AC
  Filled 2019-05-11: qty 1

## 2019-05-11 MED ORDER — FENTANYL 37.5 MCG/HR TD PT72: 1.0000 | MEDICATED_PATCH | TRANSDERMAL | 0 refills | Status: DC

## 2019-05-11 MED ORDER — SODIUM CHLORIDE 0.9 % IV SOLN
200.0000 mg | Freq: Once | INTRAVENOUS | Status: AC
  Administered 2019-05-11: 11:00:00 200 mg via INTRAVENOUS
  Filled 2019-05-11: qty 8

## 2019-05-11 MED ORDER — DRONABINOL 5 MG PO CAPS: 5.0000 mg | ORAL_CAPSULE | Freq: Two times a day (BID) | ORAL | 0 refills | Status: DC

## 2019-05-11 MED ORDER — FAMOTIDINE IN NACL 20-0.9 MG/50ML-% IV SOLN
20.0000 mg | Freq: Once | INTRAVENOUS | Status: AC
  Administered 2019-05-11: 11:00:00 20 mg via INTRAVENOUS

## 2019-05-11 MED ORDER — SODIUM CHLORIDE 0.9 % IV SOLN
Freq: Once | INTRAVENOUS | Status: AC
  Administered 2019-05-11: 10:00:00 via INTRAVENOUS
  Filled 2019-05-11: qty 250

## 2019-05-11 NOTE — Patient Instructions (Signed)
[facility] Discharge Instructions for Patients Receiving Chemotherapy  Today you received the following chemotherapy agent: Keytruda  To help prevent nausea and vomiting after your treatment, we encourage you to take your nausea medication as directed by your MD.   If you develop nausea and vomiting that is not controlled by your nausea medication, call the clinic.   BELOW ARE SYMPTOMS THAT SHOULD BE REPORTED IMMEDIATELY:  *FEVER GREATER THAN 100.5 F  *CHILLS WITH OR WITHOUT FEVER  NAUSEA AND VOMITING THAT IS NOT CONTROLLED WITH YOUR NAUSEA MEDICATION  *UNUSUAL SHORTNESS OF BREATH  *UNUSUAL BRUISING OR BLEEDING  TENDERNESS IN MOUTH AND THROAT WITH OR WITHOUT PRESENCE OF ULCERS  *URINARY PROBLEMS  *BOWEL PROBLEMS  UNUSUAL RASH Items with * indicate a potential emergency and should be followed up as soon as possible.  Feel free to call the clinic should you have any questions or concerns. The clinic phone number is [phone].  Please show the [facility] at check-in to the Emergency Department and triage nurse.    Denosumab injection(Xgeva) What is this medicine? DENOSUMAB (den oh sue mab) slows bone breakdown. Prolia is used to treat osteoporosis in women after menopause and in men, and in people who are taking corticosteroids for 6 months or more. Delton See is used to treat a high calcium level due to cancer and to prevent bone fractures and other bone problems caused by multiple myeloma or cancer bone metastases. Delton See is also used to treat giant cell tumor of the bone. This medicine may be used for other purposes; ask your health care provider or pharmacist if you have questions. COMMON BRAND NAME(S): Prolia, XGEVA What should I tell my health care provider before I take this medicine? They need to know if you have any of these conditions:  dental disease  having surgery or tooth extraction  infection  kidney disease  low levels of calcium or  Vitamin D in the blood  malnutrition  on hemodialysis  skin conditions or sensitivity  thyroid or parathyroid disease  an unusual reaction to denosumab, other medicines, foods, dyes, or preservatives  pregnant or trying to get pregnant  breast-feeding How should I use this medicine? This medicine is for injection under the skin. It is given by a health care professional in a hospital or clinic setting. A special MedGuide will be given to you before each treatment. Be sure to read this information carefully each time. For Prolia, talk to your pediatrician regarding the use of this medicine in children. Special care may be needed. For Delton See, talk to your pediatrician regarding the use of this medicine in children. While this drug may be prescribed for children as young as 13 years for selected conditions, precautions do apply. Overdosage: If you think you have taken too much of this medicine contact a poison control center or emergency room at once. NOTE: This medicine is only for you. Do not share this medicine with others. What if I miss a dose? It is important not to miss your dose. Call your doctor or health care professional if you are unable to keep an appointment. What may interact with this medicine? Do not take this medicine with any of the following medications:  other medicines containing denosumab This medicine may also interact with the following medications:  medicines that lower your chance of fighting infection  steroid medicines like prednisone or cortisone This list may not describe all possible interactions. Give your health care provider a list of  all the medicines, herbs, non-prescription drugs, or dietary supplements you use. Also tell them if you smoke, drink alcohol, or use illegal drugs. Some items may interact with your medicine. What should I watch for while using this medicine? Visit your doctor or health care professional for regular checks on your  progress. Your doctor or health care professional may order blood tests and other tests to see how you are doing. Call your doctor or health care professional for advice if you get a fever, chills or sore throat, or other symptoms of a cold or flu. Do not treat yourself. This drug may decrease your body's ability to fight infection. Try to avoid being around people who are sick. You should make sure you get enough calcium and vitamin D while you are taking this medicine, unless your doctor tells you not to. Discuss the foods you eat and the vitamins you take with your health care professional. See your dentist regularly. Brush and floss your teeth as directed. Before you have any dental work done, tell your dentist you are receiving this medicine. Do not become pregnant while taking this medicine or for 5 months after stopping it. Talk with your doctor or health care professional about your birth control options while taking this medicine. Women should inform their doctor if they wish to become pregnant or think they might be pregnant. There is a potential for serious side effects to an unborn child. Talk to your health care professional or pharmacist for more information. What side effects may I notice from receiving this medicine? Side effects that you should report to your doctor or health care professional as soon as possible:  allergic reactions like skin rash, itching or hives, swelling of the face, lips, or tongue  bone pain  breathing problems  dizziness  jaw pain, especially after dental work  redness, blistering, peeling of the skin  signs and symptoms of infection like fever or chills; cough; sore throat; pain or trouble passing urine  signs of low calcium like fast heartbeat, muscle cramps or muscle pain; pain, tingling, numbness in the hands or feet; seizures  unusual bleeding or bruising  unusually weak or tired Side effects that usually do not require medical attention  (report to your doctor or health care professional if they continue or are bothersome):  constipation  diarrhea  headache  joint pain  loss of appetite  muscle pain  runny nose  tiredness  upset stomach This list may not describe all possible side effects. Call your doctor for medical advice about side effects. You may report side effects to FDA at 1-800-FDA-1088. Where should I keep my medicine? This medicine is only given in a clinic, doctor's office, or other health care setting and will not be stored at home. NOTE: This sheet is a summary. It may not cover all possible information. If you have questions about this medicine, talk to your doctor, pharmacist, or health care provider.  2020 Elsevier/Gold Standard (2018-01-01 16:10:44)
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
## 2019-05-11 NOTE — Telephone Encounter (Signed)
Per 9/2 los appts already scheduled.

## 2019-05-12 ENCOUNTER — Telehealth: Payer: Self-pay | Admitting: *Deleted

## 2019-05-12 ENCOUNTER — Other Ambulatory Visit: Payer: Self-pay | Admitting: Hematology

## 2019-05-12 MED ORDER — DRONABINOL 5 MG PO CAPS: 5.0000 mg | ORAL_CAPSULE | Freq: Two times a day (BID) | ORAL | 0 refills | Status: DC

## 2019-05-12 NOTE — Telephone Encounter (Signed)
Covermymeds with [facility] faxed Prior authorization request for Dronabinol 5 mg.  Request to Managed Care letter tray receptacle of Prior Authorization forms for review.

## 2019-05-12 NOTE — Telephone Encounter (Signed)
Patient requests paper copy of Dronabinol, it is too expensive at current pharmacy, and they want to purchase using Good RX card. Dr. [name] provided paper Prescription and they will pick up later today.

## 2019-05-28 NOTE — Progress Notes (Signed)
HEMATOLOGY/ONCOLOGY CLINIC NOTE  Date of Service: 06/01/2019  Patient Care Team: [name] as PCP - General (Family Medicine) Lorretta Harp, MD as PCP - Cardiology (Cardiology) Brunetta Genera, MD as Consulting Physician (Hematology) Margaretha Seeds, MD as Consulting Physician (Pulmonary Disease) [name], MD as Consulting Physician (Urology)  CHIEF COMPLAINTS/PURPOSE OF CONSULTATION:  Squamous cell lung cancer  HISTORY OF PRESENTING ILLNESS:    Kyle Foster is a wonderful 73 y.o. male who has been referred to [place] by Dr. Karie Kirks for evaluation and management of Lung Mass concerning for primary lung cancer.   he pt reports he has been having back pain on and off for about 3 months and was being worked up at the [facility] in [place] by his PCP . He reports it was being maanged as MSK pain and he was referred to PT but the symptoms got progressively worse. He presented to the ED on 10/14/18 with SOB and midsternal chest pain, neck pain, and back pain which had been constant for the last 2-3 weeks.   Of note prior to the patient's visit today, pt has had a CTA Chest completed on 10/14/18 with results revealing 5.6 cm left upper lobe lesion with chest wall and thoracic spine invasion, possible epidural extension of tumor. 2. 2.7 cm left hilar mass occluding the left lower lobe pulmonary artery branch and probably attenuating or occluding the inferior left pulmonary vein. 3. Left lower lobe pulmonary nodules possibly metastatic. 4. Negative for acute PE or thoracic aortic dissection. 5. Coronary and Aortic Atherosclerosis.  Most recent lab results (10/15/18) of CBC is as follows: all values are WNL except for RBC at 3.76, [name], [name], Glucose at 111.  He subsequently had an MRI of the T spine which showed Large cavitary mass arising in the superior aspect of the left hilum and extending into the posteromedial aspect of the left upper lobe invading  the left side of the T5 and T6 vertebra and extending into the spinal canal with a slight mass effect upon the spinal cord. There is a slight pathologic compression fracture of the T6 vertebral body. 2. Probable small metastasis in the T8 vertebral body. 3. Metastatic pulmonary nodules at the left lung base posterolaterally. 4. The mass destroys the posterior aspects of the left fifth and sixth ribs.  Patient has had a h/o localized bladder cancer s/p TURBT in 2009. 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   Interval History:   Kyle Foster returns today for management and evaluation of his Squamous Cell Carcinoma Lung Cancer and maintenance Pembrolizumab. The patient's last visit with [place] was on 05/11/2019. The pt reports that he is doing well overall.  The pt reports that he has some pain in his chest and back that is not being well controlled by his current pain medications. His chest pain only occurs as he is already experiencing back pain. Pt is currently using the Fentanyl patch about 4 times a day. He is also experiencing nausea. His breathing is improving. He is using his Trelegy inhaler in the morning and is not often having to use his short-acting inhaler. Pt needs a refill of his Morphine. Pt has been using the restroom every other day. He is not currently doing any exercise or getting much activity. He denies doing any twisting or bending motions.   Of note since the patient's  last visit, pt has had CT Chest w/o contrast ([id number]) completed on 05/30/2019 with results revealing "1. Stable post treatment related changes of prior radiation therapy in the left lung. Chronic destructive changes in T5 and T6 vertebrae as well as the posterior aspects of the left fifth and sixth ribs, similar to the prior study. 2. Diffuse bronchial wall thickening with mild to moderate centrilobular and paraseptal emphysema. 3. Aortic  atherosclerosis, in addition to three-vessel coronary artery disease. Please note that although the presence of coronary artery calcium documents the presence of coronary artery disease, the severity of this disease and any potential stenosis cannot be assessed on this non-gated CT examination. Assessment for potential risk factor modification, dietary therapy or pharmacologic therapy may be warranted, if clinically indicated. 4. There are calcifications of the aortic valve and mitral valve/annulus. Echocardiographic correlation for evaluation of potential valvular dysfunction may be warranted if clinically indicated. 5. 6 mm nonobstructive calculus in the upper pole collecting system of the right kidney. 6. Additional incidental findings, as above."  Lab results today (06/01/19) of CBC w/diff and CMP is as follows: all values are WNL except for RBC at 3.62, Hgb at 12.2, HCT at 36.4, MCV at 100.6, PLTs at 127K, Lymphs Abs at 0.5K, Total Protein at 6.1, AST at 14. 06/01/2019 Magnesium at 1.9  On review of systems, pt reports nausea, chest pain, back pain, improving breathing and denies constipation, diarrhea, skin rashes, abdominal pain and any other symptoms.    Past Medical History:  Diagnosis Date   Allergy    Bladder cancer ([place]) 10/2008   CAD (coronary artery disease)    Cancer of parotid gland ([place]) 12/2009   "squamous cell cancer attached to it; took the gland out"   Depression    GERD (gastroesophageal reflux disease)    History of chickenpox    History of kidney stones    Hyperlipidemia    Hypertension    Myocardial infarction ([place]) 06/2008   Recurrent upper respiratory infection (URI)    09/01/11 saw PCP - Kathryne Eriksson , antibiotic  and prednisone    Skin cancer    "cut & burned off arms, hands, face, neck" (06/14/2018)    SURGICAL HISTORY: Past Surgical History:  Procedure Laterality Date   CATARACT EXTRACTION W/ INTRAOCULAR LENS IMPLANT Right 12/2007   CORONARY  ANGIOPLASTY WITH STENT PLACEMENT  06/2008   "3 stents" (06/14/2018)   CYSTOSCOPY W/ RETROGRADES  09/22/2011   Procedure: CYSTOSCOPY WITH RETROGRADE PYELOGRAM;  Surgeon: Bernestine Amass, MD;  Location: [facility];  Service: Urology;  Laterality: Left;  Cystoscopy left Retrograde Pyelogram      (c-arm)    CYSTOSCOPY WITH BIOPSY  09/22/2011   Procedure: CYSTOSCOPY WITH BIOPSY;  Surgeon: Bernestine Amass, MD;  Location: [facility];  Service: Urology;  Laterality: N/A;   Biopsy   EXCISIONAL HEMORRHOIDECTOMY  ~ 2006   EYE SURGERY Left 04/2011   "reconstruction; gold weight in eye lid " (06/14/2018)   FOOT NEUROMA SURGERY Left    INGUINAL HERNIA REPAIR Right 02/2018   IR IMAGING GUIDED PORT INSERTION  11/23/2018   NM MYOCAR PERF WALL MOTION  07/11/2008   MILD ISCHEMIA IN THE BASL INFERIOR, MID INFERIOR & APICAL INFERIOR REGIONS   SALIVARY GLAND SURGERY Left 04/2011   "squamous cell cancer attached to it; took the gland out"   SKIN CANCER EXCISION     "arms, hands, face, neck" (06/14/2018)   TRANSURETHRAL RESECTION OF BLADDER TUMOR WITH GYRUS (TURBT-GYRUS)  2010  SOCIAL HISTORY: Social History   Socioeconomic History   Marital status: Married    Spouse name: Not on file   Number of children: Not on file   Years of education: Not on file   Highest education level: Not on file  Occupational History   Not on file  Social Needs   Financial resource strain: Not on file   Food insecurity    Worry: Not on file    Inability: Not on file   Transportation needs    Medical: Not on file    Non-medical: Not on file  Tobacco Use   Smoking status: Current Every Day Smoker    Packs/day: 0.50    Years: 52.00    Pack years: 26.00    Types: Cigarettes    Start date: 1968   Smokeless tobacco: Never Used   Tobacco comment: 03/21/19- smoking 10 cigs a day   Substance and Sexual Activity   Alcohol use: Not Currently   Drug use: Not Currently   Sexual activity: Not on file  Lifestyle     Physical activity    Days per week: Not on file    Minutes per session: Not on file   Stress: Not on file  Relationships   Social connections    Talks on phone: Not on file    Gets together: Not on file    Attends religious service: Not on file    Active member of club or organization: Not on file    Attends meetings of clubs or organizations: Not on file    Relationship status: Not on file   Intimate partner violence    Fear of current or ex partner: Not on file    Emotionally abused: Not on file    Physically abused: Not on file    Forced sexual activity: Not on file  Other Topics Concern   Not on file  Social History Narrative   Not on file    FAMILY HISTORY: Family History  Problem Relation Age of Onset   Heart attack Mother 68   Cancer Mother        Lung   Diabetes Mother    Heart disease Mother    Hypertension Mother    Stroke Father 42   Cancer Father        Lung   Brain cancer Brother    Cancer Sister        Melanoma of great Toe   Hyperlipidemia Sister     ALLERGIES:  is allergic to codeine.  MEDICATIONS:  Current Outpatient Medications  Medication Sig Dispense Refill   albuterol (VENTOLIN HFA) 108 (90 Base) MCG/ACT inhaler Inhale 1-2 puffs into the lungs every 6 (six) hours as needed for wheezing or shortness of breath. 1 Inhaler 2   atorvastatin (LIPITOR) 80 MG tablet Take 1 tablet (80 mg total) by mouth daily at 6 PM. 90 tablet 3   calcium carbonate (TUMS - DOSED IN MG ELEMENTAL CALCIUM) 500 MG chewable tablet Chew 1 tablet (200 mg of elemental calcium total) by mouth 3 (three) times daily with meals. 30 tablet 3   Cholecalciferol (VITAMIN D) 2000 UNITS tablet Take 2,000 Units by mouth 2 (two) times daily.     clopidogrel (PLAVIX) 75 MG tablet Take 1 tablet (75 mg total) by mouth daily. 90 tablet 3   dronabinol (MARINOL) 5 MG capsule Take 1 capsule (5 mg total) by mouth 2 (two) times daily before a meal. 60 capsule 0   fentaNYL  37.5 MCG/HR PT72 Place 1 patch onto the skin every 3 (three) days. 10 patch 0   Fluticasone-Umeclidin-Vilant (TRELEGY ELLIPTA) 100-62.5-25 MCG/INH AEPB Inhale 1 puff into the lungs daily. 1 each 6   Fluticasone-Umeclidin-Vilant (TRELEGY ELLIPTA) 100-62.5-25 MCG/INH AEPB Inhale 1 puff into the lungs daily. 1 each 0   Hypromellose (ARTIFICIAL TEARS OP) Place 1 drop into both eyes at bedtime.      lidocaine-prilocaine (EMLA) cream Apply 1 application topically as needed. 30 g 1   LORazepam (ATIVAN) 0.5 MG tablet Take 1 tablet (0.5 mg total) by mouth every 6 (six) hours as needed (Nausea or vomiting). 30 tablet 0   magnesium oxide (MAG-OX) 400 MG tablet TAKE 1 TABLET BY MOUTH EVERY DAY 30 tablet 2   metoprolol tartrate (LOPRESSOR) 25 MG tablet Take 1 tablet (25 mg total) by mouth 2 (two) times daily. 180 tablet 3   morphine (MSIR) 15 MG tablet Take 1-2 tablets (15-30 mg total) by mouth every 4 (four) hours as needed for moderate pain or severe pain. 120 tablet 0   ondansetron (ZOFRAN) 8 MG tablet Take 1 tablet (8 mg total) by mouth every 8 (eight) hours as needed for nausea or vomiting. 30 tablet 1   pantoprazole (PROTONIX) 40 MG tablet Take 1 tablet (40 mg total) by mouth daily. 90 tablet 3   umeclidinium bromide (INCRUSE ELLIPTA) 62.5 MCG/INH AEPB Inhale 1 puff into the lungs daily. 1 each 3   No current facility-administered medications for this visit.     REVIEW OF SYSTEMS:    A 10+ POINT REVIEW OF SYSTEMS WAS OBTAINED including neurology, dermatology, psychiatry, cardiac, respiratory, lymph, extremities, GI, GU, Musculoskeletal, constitutional, breasts, reproductive, HEENT.  All pertinent positives are noted in the HPI.  All others are negative.   PHYSICAL EXAMINATION: ECOG PERFORMANCE STATUS: 2 - Symptomatic, <50% confined to bed  Vitals:   06/01/19 1006  BP: 120/67  Pulse: 66  Resp: 18  Temp: 98.7 F (37.1 C)  SpO2: 99%   Filed Weights   06/01/19 1006  Weight: 123 lb  3.2 oz (55.9 kg)   .Body mass index is 18.19 kg/m.   GENERAL:alert, in no acute distress and comfortable SKIN: no acute rashes, no significant lesions EYES: conjunctiva are pink and non-injected, sclera anicteric OROPHARYNX: MMM, no exudates, no oropharyngeal erythema or ulceration NECK: supple, no JVD LYMPH:  no palpable lymphadenopathy in the cervical, axillary or inguinal regions LUNGS: clear to auscultation b/l with normal respiratory effort HEART: regular rate & rhythm ABDOMEN:  normoactive bowel sounds , non tender, not distended. No palpable hepatosplenomegaly.  Extremity: no pedal edema PSYCH: alert & oriented x 3 with fluent speech NEURO: no focal motor/sensory deficits  LABORATORY DATA:  I have reviewed the data as listed  . CBC Latest Ref Rng & Units 06/01/2019 05/11/2019 04/20/2019  WBC 4.0 - 10.5 K/uL 5.1 6.9 7.9  Hemoglobin 13.0 - 17.0 g/dL 12.2(L) 11.5(L) 12.0(L)  Hematocrit 39.0 - 52.0 % 36.4(L) 34.7(L) 37.4(L)  Platelets 150 - 400 K/uL 127(L) 128(L) 133(L)    . CMP Latest Ref Rng & Units 06/01/2019 05/11/2019 04/20/2019  Glucose 70 - 99 mg/dL 89 97 91  BUN 8 - 23 mg/dL 18 17 24(H)  Creatinine 0.61 - 1.24 mg/dL 0.82 0.86 0.88  Sodium 135 - 145 mmol/L 141 141 142  Potassium 3.5 - 5.1 mmol/L 4.1 4.2 4.5  Chloride 98 - 111 mmol/L 108 108 108  CO2 22 - 32 mmol/L 26 25 24   Calcium 8.9 - 10.3  mg/dL 9.1 8.8(L) 9.2  Total Protein 6.5 - 8.1 g/dL 6.1(L) 6.1(L) 6.6  Total Bilirubin 0.3 - 1.2 mg/dL 0.5 0.5 0.6  Alkaline Phos 38 - 126 U/L 50 51 57  AST 15 - 41 U/L 14(L) 17 21  ALT 0 - 44 U/L 12 14 20         RADIOGRAPHIC STUDIES: I have personally reviewed the radiological images as listed and agreed with the findings in the report. Ct Chest Wo Contrast  Result Date: 05/30/2019 CLINICAL DATA:  73 year old male with history of non-small cell lung cancer. Status post chemotherapy and radiation therapy, currently undergoing immunotherapy. Follow-up study. EXAM: CT CHEST  WITHOUT CONTRAST TECHNIQUE: Multidetector CT imaging of the chest was performed following the standard protocol without IV contrast. COMPARISON:  Chest CT 02/09/2019. FINDINGS: Cardiovascular: Heart size is normal. There is no significant pericardial fluid, thickening or pericardial calcification. There is aortic atherosclerosis, as well as atherosclerosis of the great vessels of the mediastinum and the coronary arteries, including calcified atherosclerotic plaque in the left anterior descending, left circumflex and right coronary arteries. Calcifications of the aortic valve and mitral valve/annulus. Right internal jugular single-lumen porta cath with tip terminating in the right atrium. Mediastinum/Nodes: No pathologically enlarged mediastinal or hilar lymph nodes. Please note that accurate exclusion of hilar adenopathy is limited on noncontrast CT scans. Esophagus is unremarkable in appearance. No axillary lymphadenopathy. Lungs/Pleura: In the superior segment of the left lower lobe in the region of the treated lesion there is again a thick-walled cavitary area adjacent to the destructive changes in the thoracic vertebral column and posterior left ribs (described below). Surrounding architectural distortion and septal thickening related to prior radiation therapy. No new suspicious appearing pulmonary nodules or masses are noted. No acute consolidative airspace disease. No pleural effusions. Diffuse bronchial wall thickening with mild to moderate centrilobular and paraseptal emphysema. Upper Abdomen: 6 mm low-attenuation lesion in segment 7 of the liver, incompletely characterized on today's non-contrast CT examination, but stable to slightly smaller than the prior study. Nonobstructive calculus in the upper pole collecting system of the right kidney measuring 6 mm. In the upper pole of the left kidney there is a large low-attenuation lesion incompletely imaged, but measuring at least 9.1 x 7.7 cm and previously  characterized as a simple cyst. Aortic atherosclerosis. Musculoskeletal: Large lytic lesions are again noted in the T5 and T6 vertebral bodies extending into the left transverse processes and pedicles, as well as the posterior aspects of the left fifth and sixth ribs, similar to the prior examination. No new osseous lesions are otherwise identified. IMPRESSION: 1. Stable post treatment related changes of prior radiation therapy in the left lung. Chronic destructive changes in T5 and T6 vertebrae as well as the posterior aspects of the left fifth and sixth ribs, similar to the prior study. 2. Diffuse bronchial wall thickening with mild to moderate centrilobular and paraseptal emphysema. 3. Aortic atherosclerosis, in addition to three-vessel coronary artery disease. Please note that although the presence of coronary artery calcium documents the presence of coronary artery disease, the severity of this disease and any potential stenosis cannot be assessed on this non-gated CT examination. Assessment for potential risk factor modification, dietary therapy or pharmacologic therapy may be warranted, if clinically indicated. 4. There are calcifications of the aortic valve and mitral valve/annulus. Echocardiographic correlation for evaluation of potential valvular dysfunction may be warranted if clinically indicated. 5. 6 mm nonobstructive calculus in the upper pole collecting system of the right kidney. 6. Additional  incidental findings, as above Aortic Atherosclerosis (ICD10-I70.0) and Emphysema (ICD10-J43.9). Electronically Signed   By: Vinnie Langton M.D.   On: 05/30/2019 11:49     ASSESSMENT & PLAN:  73 y.o. male with  1. 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 10/19/18 Lung biopsy revealed Squamous Cell carcinoma S/p Palliative RT to LUL lung mass with 30Gy in 10  fractions between 10/22/18 and 11/03/18, due to impending cord compression  10/19/18 PDL-1 status report found 30% PDL-1 tumor proportion  11/06/18 MRI Brain revealed No intracranial metastatic disease identified. 2. Mild chronic microvascular ischemic changes and volume loss of the brain.  11/10/18 PET/CT revealed Locally advanced left lung mass, as detailed previously. 2. Left perihilar direct extension versus nodal metastasis. 3. No findings of hypermetabolic distant metastasis. 4. No findings of extrathoracic hypermetabolic metastasis.  02/09/19 CT Chest and Abdomen revealed "Today's study demonstrates a positive response to therapy with slight regression of the primary lesion in the superior segment of the left lower lobe which again demonstrates direct invasion of the adjacent posteromedial left chest wall and vertebral column, as detailed above. 2. However, there is a new hypovascular lesion in segment 7 of the liver. This is nonspecific and too small to characterize, but given the development compared to prior studies, this is concerning for potential metastasis. This could be definitively characterized with MRI of the abdomen with and without IV gadolinium if clinically appropriate. 3. 5 mm nonobstructive calculus in the right renal collecting system. 4. Aortic atherosclerosis, in addition to 3 vessel coronary artery disease. Assessment for potential risk factor modification, dietary therapy or pharmacologic therapy may be warranted, if clinically Indicated. 5. There are calcifications of the aortic valve. Echocardiographic correlation for evaluation of potential valvular dysfunction may be warranted if clinically indicated. 6. Additional incidental findings, as above." Discussed that I suspect that the liver finding is likely a cyst, as progression through treatment would be unexpected. Nevertheless, will monitor this finding over time with future scans.  S/p 5 cycles of Carboplatin AUC of 5, 139m/m2  Taxol, and Pembrolizumab with G-CSF support completed on 02/16/19.  03/08/19 ECHO which revealed LV EF of 55-60%  2. 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 Smoker 4. 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  Has regularly followed up with ENT Dr. JFenton Malling6. Cancer related pain   PLAN: -Discussed pt labwork today, 06/01/19; all values are WNL except for RBC at 3.62, Hgb at 12.2, HCT at 36.4, MCV at 100.6, PLTs at 127K, Lymphs Abs at 0.5K, Total Protein at 6.1, AST at 14. -Discussed 06/01/2019 Magnesium at 1.9 -Discussed 05/30/2019 CT Chest w/o contrast ([id number] which revealed "1. Stable post treatment related changes of prior radiation therapy in the left lung. Chronic destructive changes in T5 and T6 vertebrae as well as the posterior aspects of the left fifth and sixth ribs, similar to the prior study. 2. Diffuse bronchial wall thickening with mild to moderate centrilobular and paraseptal emphysema. 3. Aortic atherosclerosis, in addition to three-vessel coronary artery disease. Please note that although the presence of coronary artery calcium documents the presence of coronary artery disease, the severity of this disease and any potential stenosis cannot be  assessed on this non-gated CT examination. Assessment for potential risk factor modification, dietary therapy or pharmacologic therapy may be warranted, if clinically indicated. 4. There are calcifications of the aortic valve and mitral valve/annulus. Echocardiographic correlation for evaluation of potential valvular dysfunction may be warranted if clinically indicated. 5. 6 mm nonobstructive calculus in the upper pole collecting system of the right kidney. 6. Additional incidental findings, as above." -The pt has no prohibitive toxicities from  continuing maintenance Pembrolizumab at this time. -Pt has a new PCP. Dr. Raiford Noble at [facility] pt that we will focus on managing pain with medications, no indications to do additional surgery  -Advised pt to change one medication at a time  -Continue Xgevea every 6 weeks with every other treatment -Recommended 4% Lidocane patch  -Will increase dosage of Fentanyl patch to 61mg/h -Rx Morphine -Rx Cymbalta  FOLLOW UP: -Please schedule next 3 cycles of Pembrolizumab with labs and MD visit -continue Xgeva q6 weeks with every other treatment.   The total time spent in the appt was 25 minutes and more than 50% was on counseling and direct patient cares.  All of the patient's questions were answered with apparent satisfaction. The patient knows to call the clinic with any problems, questions or concerns.  GSullivan LoneMD MNorthfieldAAHIVMS SMillmanderr Center For Eye Care PcCCrossroads Community HospitalHematology/Oncology Physician [facility] (Office):       [phone](Work cell):  [phone](Fax):           [phone] 06/01/2019 10:48 AM  I, JYevette Edwards am acting as a scribe for Dr. GSullivan Lone   .I have reviewed the above documentation for accuracy and completeness, and I agree with the above. .Brunetta GeneraMD

## 2019-05-30 ENCOUNTER — Other Ambulatory Visit: Payer: Self-pay

## 2019-05-30 ENCOUNTER — Ambulatory Visit (HOSPITAL_COMMUNITY)
Admission: RE | Admit: 2019-05-30 | Discharge: 2019-05-30 | Disposition: A | Discharge status: Home / Self Care | Payer: Medicare Other | Source: Ambulatory Visit | Attending: Hematology | Admitting: Hematology

## 2019-05-30 DIAGNOSIS — C3492 Malignant neoplasm of unspecified part of left bronchus or lung: Secondary | ICD-10-CM | POA: Diagnosis not present

## 2019-05-30 DIAGNOSIS — C7951 Secondary malignant neoplasm of bone: Secondary | ICD-10-CM | POA: Diagnosis not present

## 2019-05-30 DIAGNOSIS — J432 Centrilobular emphysema: Secondary | ICD-10-CM | POA: Diagnosis not present

## 2019-05-30 DIAGNOSIS — J9809 Other diseases of bronchus, not elsewhere classified: Secondary | ICD-10-CM | POA: Diagnosis not present

## 2019-06-01 ENCOUNTER — Inpatient Hospital Stay: Payer: Medicare Other

## 2019-06-01 ENCOUNTER — Ambulatory Visit: Payer: No Typology Code available for payment source

## 2019-06-01 ENCOUNTER — Inpatient Hospital Stay: Payer: Medicare Other | Admitting: Nutrition

## 2019-06-01 ENCOUNTER — Inpatient Hospital Stay (HOSPITAL_BASED_OUTPATIENT_CLINIC_OR_DEPARTMENT_OTHER): Payer: Medicare Other | Admitting: Hematology

## 2019-06-01 ENCOUNTER — Other Ambulatory Visit: Payer: Self-pay

## 2019-06-01 VITALS — BP 120/67 | HR 66 | Temp 98.7°F | Resp 18 | Ht 69.0 in | Wt 123.2 lb

## 2019-06-01 DIAGNOSIS — C7951 Secondary malignant neoplasm of bone: Secondary | ICD-10-CM

## 2019-06-01 DIAGNOSIS — Z5112 Encounter for antineoplastic immunotherapy: Secondary | ICD-10-CM | POA: Diagnosis not present

## 2019-06-01 DIAGNOSIS — C799 Secondary malignant neoplasm of unspecified site: Secondary | ICD-10-CM | POA: Diagnosis not present

## 2019-06-01 DIAGNOSIS — I10 Essential (primary) hypertension: Secondary | ICD-10-CM | POA: Diagnosis not present

## 2019-06-01 DIAGNOSIS — C3492 Malignant neoplasm of unspecified part of left bronchus or lung: Secondary | ICD-10-CM

## 2019-06-01 DIAGNOSIS — C3412 Malignant neoplasm of upper lobe, left bronchus or lung: Secondary | ICD-10-CM

## 2019-06-01 DIAGNOSIS — Z7189 Other specified counseling: Secondary | ICD-10-CM

## 2019-06-01 DIAGNOSIS — G893 Neoplasm related pain (acute) (chronic): Secondary | ICD-10-CM | POA: Diagnosis not present

## 2019-06-01 DIAGNOSIS — Z9861 Coronary angioplasty status: Secondary | ICD-10-CM

## 2019-06-01 DIAGNOSIS — E785 Hyperlipidemia, unspecified: Secondary | ICD-10-CM | POA: Diagnosis not present

## 2019-06-01 DIAGNOSIS — I2510 Atherosclerotic heart disease of native coronary artery without angina pectoris: Secondary | ICD-10-CM

## 2019-06-01 LAB — CBC WITH DIFFERENTIAL/PLATELET
Abs Immature Granulocytes: 0.02 10*3/uL (ref 0.00–0.07)
Basophils Absolute: 0 10*3/uL (ref 0.0–0.1)
Basophils Relative: 0 %
Eosinophils Absolute: 0.1 10*3/uL (ref 0.0–0.5)
Eosinophils Relative: 2 %
HCT: 36.4 % — ABNORMAL LOW (ref 39.0–52.0)
Hemoglobin: 12.2 g/dL — ABNORMAL LOW (ref 13.0–17.0)
Immature Granulocytes: 0 %
Lymphocytes Relative: 10 %
Lymphs Abs: 0.5 10*3/uL — ABNORMAL LOW (ref 0.7–4.0)
MCH: 33.7 pg (ref 26.0–34.0)
MCHC: 33.5 g/dL (ref 30.0–36.0)
MCV: 100.6 fL — ABNORMAL HIGH (ref 80.0–100.0)
Monocytes Absolute: 0.5 10*3/uL (ref 0.1–1.0)
Monocytes Relative: 10 %
Neutro Abs: 3.9 10*3/uL (ref 1.7–7.7)
Neutrophils Relative %: 78 %
Platelets: 127 10*3/uL — ABNORMAL LOW (ref 150–400)
RBC: 3.62 MIL/uL — ABNORMAL LOW (ref 4.22–5.81)
RDW: 12 % (ref 11.5–15.5)
WBC: 5.1 10*3/uL (ref 4.0–10.5)
nRBC: 0 % (ref 0.0–0.2)

## 2019-06-01 LAB — CMP (CANCER CENTER ONLY)
ALT: 12 U/L (ref 0–44)
AST: 14 U/L — ABNORMAL LOW (ref 15–41)
Albumin: 3.8 g/dL (ref 3.5–5.0)
Alkaline Phosphatase: 50 U/L (ref 38–126)
Anion gap: 7 (ref 5–15)
BUN: 18 mg/dL (ref 8–23)
CO2: 26 mmol/L (ref 22–32)
Calcium: 9.1 mg/dL (ref 8.9–10.3)
Chloride: 108 mmol/L (ref 98–111)
Creatinine: 0.82 mg/dL (ref 0.61–1.24)
GFR, Est AFR Am: >60 mL/min (ref >60)
GFR, Estimated: >60 mL/min (ref >60)
Glucose, Bld: 89 mg/dL (ref 70–99)
Potassium: 4.1 mmol/L (ref 3.5–5.1)
Sodium: 141 mmol/L (ref 135–145)
Total Bilirubin: 0.5 mg/dL (ref 0.3–1.2)
Total Protein: 6.1 g/dL — ABNORMAL LOW (ref 6.5–8.1)

## 2019-06-01 LAB — MAGNESIUM: Magnesium: 1.9 mg/dL (ref 1.7–2.4)

## 2019-06-01 MED ORDER — DENOSUMAB 120 MG/1.7ML ~~LOC~~ SOLN
SUBCUTANEOUS | Status: AC
  Filled 2019-06-01: qty 1.7

## 2019-06-01 MED ORDER — DIPHENHYDRAMINE HCL 50 MG/ML IJ SOLN
INTRAMUSCULAR | Status: AC
  Filled 2019-06-01: qty 1

## 2019-06-01 MED ORDER — SODIUM CHLORIDE 0.9 % IV SOLN
Freq: Once | INTRAVENOUS | Status: AC
  Administered 2019-06-01: 11:00:00 via INTRAVENOUS
  Filled 2019-06-01: qty 250

## 2019-06-01 MED ORDER — SODIUM CHLORIDE 0.9% FLUSH
10.0000 mL | INTRAVENOUS | Status: DC | PRN
  Administered 2019-06-01: 10 mL
  Filled 2019-06-01: qty 10

## 2019-06-01 MED ORDER — DIPHENHYDRAMINE HCL 50 MG/ML IJ SOLN
25.0000 mg | Freq: Once | INTRAMUSCULAR | Status: AC
  Administered 2019-06-01: 25 mg via INTRAVENOUS

## 2019-06-01 MED ORDER — DENOSUMAB 120 MG/1.7ML ~~LOC~~ SOLN
120.0000 mg | Freq: Once | SUBCUTANEOUS | Status: AC
  Administered 2019-06-01: 120 mg via SUBCUTANEOUS

## 2019-06-01 MED ORDER — HEPARIN SOD (PORK) LOCK FLUSH 100 UNIT/ML IV SOLN
500.0000 [IU] | Freq: Once | INTRAVENOUS | Status: AC | PRN
  Administered 2019-06-01: 500 [IU]
  Filled 2019-06-01: qty 5

## 2019-06-01 MED ORDER — SODIUM CHLORIDE 0.9 % IV SOLN
200.0000 mg | Freq: Once | INTRAVENOUS | Status: AC
  Administered 2019-06-01: 200 mg via INTRAVENOUS
  Filled 2019-06-01: qty 8

## 2019-06-01 MED ORDER — FAMOTIDINE IN NACL 20-0.9 MG/50ML-% IV SOLN
INTRAVENOUS | Status: AC
  Filled 2019-06-01: qty 50

## 2019-06-01 MED ORDER — FAMOTIDINE IN NACL 20-0.9 MG/50ML-% IV SOLN
20.0000 mg | Freq: Once | INTRAVENOUS | Status: AC
  Administered 2019-06-01: 20 mg via INTRAVENOUS

## 2019-06-01 NOTE — Patient Instructions (Addendum)
[facility] Discharge Instructions for Patients Receiving Chemotherapy  Today you received the following chemotherapy agents: Keytruda.  To help prevent nausea and vomiting after your treatment, we encourage you to take your nausea medication as directed.   If you develop nausea and vomiting that is not controlled by your nausea medication, call the clinic.   BELOW ARE SYMPTOMS THAT SHOULD BE REPORTED IMMEDIATELY:  *FEVER GREATER THAN 100.5 F  *CHILLS WITH OR WITHOUT FEVER  NAUSEA AND VOMITING THAT IS NOT CONTROLLED WITH YOUR NAUSEA MEDICATION  *UNUSUAL SHORTNESS OF BREATH  *UNUSUAL BRUISING OR BLEEDING  TENDERNESS IN MOUTH AND THROAT WITH OR WITHOUT PRESENCE OF ULCERS  *URINARY PROBLEMS  *BOWEL PROBLEMS  UNUSUAL RASH Items with * indicate a potential emergency and should be followed up as soon as possible.  Feel free to call the clinic should you have any questions or concerns. The clinic phone number is [phone].  Please show the CHEMO ALERT CARD at check-in to the Emergency Department and triage nurse.  Denosumab injection What is this medicine? DENOSUMAB (den oh sue mab) slows bone breakdown. Prolia is used to treat osteoporosis in women after menopause and in men, and in people who are taking corticosteroids for 6 months or more. Xgeva is used to treat a high calcium level due to cancer and to prevent bone fractures and other bone problems caused by multiple myeloma or cancer bone metastases. Xgeva is also used to treat giant cell tumor of the bone. This medicine may be used for other purposes; ask your health care provider or pharmacist if you have questions. COMMON BRAND NAME(S): Prolia, XGEVA What should I tell my health care provider before I take this medicine? They need to know if you have any of these conditions:  dental disease  having surgery or tooth extraction  infection  kidney disease  low levels of calcium or Vitamin D in the  blood  malnutrition  on hemodialysis  skin conditions or sensitivity  thyroid or parathyroid disease  an unusual reaction to denosumab, other medicines, foods, dyes, or preservatives  pregnant or trying to get pregnant  breast-feeding How should I use this medicine? This medicine is for injection under the skin. It is given by a health care professional in a hospital or clinic setting. A special MedGuide will be given to you before each treatment. Be sure to read this information carefully each time. For Prolia, talk to your pediatrician regarding the use of this medicine in children. Special care may be needed. For Xgeva, talk to your pediatrician regarding the use of this medicine in children. While this drug may be prescribed for children as young as 13 years for selected conditions, precautions do apply. Overdosage: If you think you have taken too much of this medicine contact a poison control center or emergency room at once. NOTE: This medicine is only for you. Do not share this medicine with others. What if I miss a dose? It is important not to miss your dose. Call your doctor or health care professional if you are unable to keep an appointment. What may interact with this medicine? Do not take this medicine with any of the following medications:  other medicines containing denosumab This medicine may also interact with the following medications:  medicines that lower your chance of fighting infection  steroid medicines like prednisone or cortisone This list may not describe all possible interactions. Give your health care provider a list of all the medicines, herbs, non-prescription   drugs, or dietary supplements you use. Also tell them if you smoke, drink alcohol, or use illegal drugs. Some items may interact with your medicine. What should I watch for while using this medicine? Visit your doctor or health care professional for regular checks on your progress. Your doctor or  health care professional may order blood tests and other tests to see how you are doing. Call your doctor or health care professional for advice if you get a fever, chills or sore throat, or other symptoms of a cold or flu. Do not treat yourself. This drug may decrease your body's ability to fight infection. Try to avoid being around people who are sick. You should make sure you get enough calcium and vitamin D while you are taking this medicine, unless your doctor tells you not to. Discuss the foods you eat and the vitamins you take with your health care professional. See your dentist regularly. Brush and floss your teeth as directed. Before you have any dental work done, tell your dentist you are receiving this medicine. Do not become pregnant while taking this medicine or for 5 months after stopping it. Talk with your doctor or health care professional about your birth control options while taking this medicine. Women should inform their doctor if they wish to become pregnant or think they might be pregnant. There is a potential for serious side effects to an unborn child. Talk to your health care professional or pharmacist for more information. What side effects may I notice from receiving this medicine? Side effects that you should report to your doctor or health care professional as soon as possible:  allergic reactions like skin rash, itching or hives, swelling of the face, lips, or tongue  bone pain  breathing problems  dizziness  jaw pain, especially after dental work  redness, blistering, peeling of the skin  signs and symptoms of infection like fever or chills; cough; sore throat; pain or trouble passing urine  signs of low calcium like fast heartbeat, muscle cramps or muscle pain; pain, tingling, numbness in the hands or feet; seizures  unusual bleeding or bruising  unusually weak or tired Side effects that usually do not require medical attention (report to your doctor or  health care professional if they continue or are bothersome):  constipation  diarrhea  headache  joint pain  loss of appetite  muscle pain  runny nose  tiredness  upset stomach This list may not describe all possible side effects. Call your doctor for medical advice about side effects. You may report side effects to FDA at 1-800-FDA-1088. Where should I keep my medicine? This medicine is only given in a clinic, doctor's office, or other health care setting and will not be stored at home. NOTE: This sheet is a summary. It may not cover all possible information. If you have questions about this medicine, talk to your doctor, pharmacist, or health care provider.  2020 [employer]vier/Gold Standard (2018-01-01 16:10:44)

## 2019-06-01 NOTE — Progress Notes (Signed)
Nutrition follow-up completed with patient during infusion for metastatic lung cancer. Weight is stable and documented as 123.2 pounds. Albumin was 3.8. Reports occasional nausea and is taking medication as needed. He continues to drink [employer] essentials. He denies other nutrition concerns.  Nutrition diagnosis: Unintended weight loss is stable.  Intervention: Patient educated to continue strategies for improving oral intake. Continue [employer] breakfast essentials as needed.  Monitoring, evaluation, goals: Patient will tolerate increased calories and protein to minimize weight loss and improve quality of life.  Next visit: To be scheduled as needed.  **Disclaimer: This note was dictated with voice recognition software. Similar sounding words can inadvertently be transcribed and this note may contain transcription errors which may not have been corrected upon publication of note.**

## 2019-06-01 NOTE — Progress Notes (Signed)
RN went to hang IV Keytruda with blue tubing and filter.  Pump read air-in-line.  RN evaluated, blue tubing line un-primed.  RN notified pharmacy, replacement to be sent.

## 2019-06-02 ENCOUNTER — Telehealth: Payer: Self-pay | Admitting: Hematology

## 2019-06-02 MED ORDER — DULOXETINE HCL 30 MG PO CPEP: 30.0000 mg | ORAL_CAPSULE | Freq: Every day | ORAL | 3 refills | Status: DC

## 2019-06-02 MED ORDER — ONDANSETRON HCL 8 MG PO TABS: 8.0000 mg | ORAL_TABLET | Freq: Three times a day (TID) | ORAL | 1 refills | Status: DC | PRN

## 2019-06-02 MED ORDER — FENTANYL 50 MCG/HR TD PT72: 1.0000 | MEDICATED_PATCH | TRANSDERMAL | 0 refills | Status: DC

## 2019-06-02 MED ORDER — PANTOPRAZOLE SODIUM 40 MG PO TBEC: 40.0000 mg | DELAYED_RELEASE_TABLET | Freq: Every day | ORAL | 3 refills | Status: DC

## 2019-06-02 MED ORDER — MORPHINE SULFATE 15 MG PO TABS: 15.0000 mg | ORAL_TABLET | ORAL | 0 refills | Status: DC | PRN

## 2019-06-02 NOTE — Telephone Encounter (Signed)
Scheduled appt per 9/23 los.

## 2019-06-07 ENCOUNTER — Telehealth: Payer: Self-pay | Admitting: *Deleted

## 2019-06-07 NOTE — Telephone Encounter (Signed)
[facility] faxed Prior authorization request for Dronabinol 5 mg capsules through covermymeds.  Request to [employer] letter tray receptacle of Prior Authorization requests and forms for review.

## 2019-06-10 ENCOUNTER — Telehealth: Payer: Self-pay | Admitting: *Deleted

## 2019-06-10 NOTE — Telephone Encounter (Signed)
Message left by Barnett Applebaum at [facility] r/t PA for Dronabinol. [phone] CB#. Needs diagnosis verified: Anorexia r/t chemo or anorexia r/t nausea & vomiting. Attempted to contact at number given. Rep unable to locate patient in system with [facility] number given

## 2019-06-13 NOTE — Telephone Encounter (Signed)
[facility] staff member that processes PAs for medications. Shared information and staff member will f/u with [facility].
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
## 2019-06-20 ENCOUNTER — Other Ambulatory Visit: Payer: Self-pay | Admitting: Hematology

## 2019-06-22 ENCOUNTER — Inpatient Hospital Stay: Payer: Medicare Other

## 2019-06-22 ENCOUNTER — Inpatient Hospital Stay (HOSPITAL_BASED_OUTPATIENT_CLINIC_OR_DEPARTMENT_OTHER): Payer: Medicare Other | Admitting: Hematology

## 2019-06-22 ENCOUNTER — Other Ambulatory Visit: Payer: Self-pay

## 2019-06-22 ENCOUNTER — Inpatient Hospital Stay: Payer: Medicare Other | Attending: Hematology

## 2019-06-22 VITALS — BP 120/59 | HR 60 | Temp 98.7°F | Resp 18 | Ht 69.0 in | Wt 121.6 lb

## 2019-06-22 DIAGNOSIS — C3492 Malignant neoplasm of unspecified part of left bronchus or lung: Secondary | ICD-10-CM

## 2019-06-22 DIAGNOSIS — C3412 Malignant neoplasm of upper lobe, left bronchus or lung: Secondary | ICD-10-CM | POA: Diagnosis not present

## 2019-06-22 DIAGNOSIS — I2510 Atherosclerotic heart disease of native coronary artery without angina pectoris: Secondary | ICD-10-CM

## 2019-06-22 DIAGNOSIS — F17210 Nicotine dependence, cigarettes, uncomplicated: Secondary | ICD-10-CM | POA: Diagnosis not present

## 2019-06-22 DIAGNOSIS — Z9861 Coronary angioplasty status: Secondary | ICD-10-CM

## 2019-06-22 DIAGNOSIS — Z5112 Encounter for antineoplastic immunotherapy: Secondary | ICD-10-CM

## 2019-06-22 DIAGNOSIS — Z8551 Personal history of malignant neoplasm of bladder: Secondary | ICD-10-CM | POA: Insufficient documentation

## 2019-06-22 DIAGNOSIS — I252 Old myocardial infarction: Secondary | ICD-10-CM | POA: Insufficient documentation

## 2019-06-22 DIAGNOSIS — C7951 Secondary malignant neoplasm of bone: Secondary | ICD-10-CM

## 2019-06-22 DIAGNOSIS — G893 Neoplasm related pain (acute) (chronic): Secondary | ICD-10-CM | POA: Diagnosis not present

## 2019-06-22 DIAGNOSIS — Z79899 Other long term (current) drug therapy: Secondary | ICD-10-CM | POA: Diagnosis not present

## 2019-06-22 DIAGNOSIS — Z7189 Other specified counseling: Secondary | ICD-10-CM

## 2019-06-22 DIAGNOSIS — D696 Thrombocytopenia, unspecified: Secondary | ICD-10-CM | POA: Insufficient documentation

## 2019-06-22 DIAGNOSIS — Z801 Family history of malignant neoplasm of trachea, bronchus and lung: Secondary | ICD-10-CM | POA: Diagnosis not present

## 2019-06-22 DIAGNOSIS — I10 Essential (primary) hypertension: Secondary | ICD-10-CM | POA: Insufficient documentation

## 2019-06-22 DIAGNOSIS — Z808 Family history of malignant neoplasm of other organs or systems: Secondary | ICD-10-CM | POA: Diagnosis not present

## 2019-06-22 DIAGNOSIS — C799 Secondary malignant neoplasm of unspecified site: Secondary | ICD-10-CM

## 2019-06-22 LAB — CMP (CANCER CENTER ONLY)
ALT: 13 U/L (ref 0–44)
AST: 15 U/L (ref 15–41)
Albumin: 3.8 g/dL (ref 3.5–5.0)
Alkaline Phosphatase: 56 U/L (ref 38–126)
Anion gap: 8 (ref 5–15)
BUN: 22 mg/dL (ref 8–23)
CO2: 27 mmol/L (ref 22–32)
Calcium: 9.2 mg/dL (ref 8.9–10.3)
Chloride: 107 mmol/L (ref 98–111)
Creatinine: 0.89 mg/dL (ref 0.61–1.24)
GFR, Est AFR Am: >60 mL/min (ref >60)
GFR, Estimated: >60 mL/min (ref >60)
Glucose, Bld: 90 mg/dL (ref 70–99)
Potassium: 4.2 mmol/L (ref 3.5–5.1)
Sodium: 142 mmol/L (ref 135–145)
Total Bilirubin: 0.6 mg/dL (ref 0.3–1.2)
Total Protein: 6.4 g/dL — ABNORMAL LOW (ref 6.5–8.1)

## 2019-06-22 LAB — CBC WITH DIFFERENTIAL/PLATELET
Abs Immature Granulocytes: 0.02 10*3/uL (ref 0.00–0.07)
Basophils Absolute: 0 10*3/uL (ref 0.0–0.1)
Basophils Relative: 0 %
Eosinophils Absolute: 0.1 10*3/uL (ref 0.0–0.5)
Eosinophils Relative: 2 %
HCT: 36.3 % — ABNORMAL LOW (ref 39.0–52.0)
Hemoglobin: 12.1 g/dL — ABNORMAL LOW (ref 13.0–17.0)
Immature Granulocytes: 0 %
Lymphocytes Relative: 10 %
Lymphs Abs: 0.7 10*3/uL (ref 0.7–4.0)
MCH: 33.2 pg (ref 26.0–34.0)
MCHC: 33.3 g/dL (ref 30.0–36.0)
MCV: 99.5 fL (ref 80.0–100.0)
Monocytes Absolute: 0.8 10*3/uL (ref 0.1–1.0)
Monocytes Relative: 11 %
Neutro Abs: 5.2 10*3/uL (ref 1.7–7.7)
Neutrophils Relative %: 77 %
Platelets: 131 10*3/uL — ABNORMAL LOW (ref 150–400)
RBC: 3.65 MIL/uL — ABNORMAL LOW (ref 4.22–5.81)
RDW: 12.3 % (ref 11.5–15.5)
WBC: 6.8 10*3/uL (ref 4.0–10.5)
nRBC: 0 % (ref 0.0–0.2)

## 2019-06-22 LAB — VITAMIN D 25 HYDROXY (VIT D DEFICIENCY, FRACTURES): Vit D, 25-Hydroxy: 66.7 ng/mL (ref 30–100)

## 2019-06-22 LAB — MAGNESIUM: Magnesium: 2 mg/dL (ref 1.7–2.4)

## 2019-06-22 LAB — TSH: TSH: 1.173 u[IU]/mL (ref 0.320–4.118)

## 2019-06-22 MED ORDER — FAMOTIDINE IN NACL 20-0.9 MG/50ML-% IV SOLN
INTRAVENOUS | Status: AC
  Filled 2019-06-22: qty 50

## 2019-06-22 MED ORDER — FAMOTIDINE IN NACL 20-0.9 MG/50ML-% IV SOLN
20.0000 mg | Freq: Once | INTRAVENOUS | Status: AC
  Administered 2019-06-22: 20 mg via INTRAVENOUS

## 2019-06-22 MED ORDER — SODIUM CHLORIDE 0.9% FLUSH
10.0000 mL | INTRAVENOUS | Status: DC | PRN
  Administered 2019-06-22: 10 mL
  Filled 2019-06-22: qty 10

## 2019-06-22 MED ORDER — SODIUM CHLORIDE 0.9 % IV SOLN
Freq: Once | INTRAVENOUS | Status: AC
  Administered 2019-06-22: 11:00:00 via INTRAVENOUS
  Filled 2019-06-22: qty 250

## 2019-06-22 MED ORDER — DIPHENHYDRAMINE HCL 50 MG/ML IJ SOLN
INTRAMUSCULAR | Status: AC
  Filled 2019-06-22: qty 1

## 2019-06-22 MED ORDER — DRONABINOL 5 MG PO CAPS: 5.0000 mg | ORAL_CAPSULE | Freq: Two times a day (BID) | ORAL | 0 refills | Status: DC

## 2019-06-22 MED ORDER — DIPHENHYDRAMINE HCL 50 MG/ML IJ SOLN
25.0000 mg | Freq: Once | INTRAMUSCULAR | Status: AC
  Administered 2019-06-22: 25 mg via INTRAVENOUS

## 2019-06-22 MED ORDER — SODIUM CHLORIDE 0.9 % IV SOLN
200.0000 mg | Freq: Once | INTRAVENOUS | Status: AC
  Administered 2019-06-22: 200 mg via INTRAVENOUS
  Filled 2019-06-22: qty 8

## 2019-06-22 MED ORDER — HEPARIN SOD (PORK) LOCK FLUSH 100 UNIT/ML IV SOLN
500.0000 [IU] | Freq: Once | INTRAVENOUS | Status: AC | PRN
  Administered 2019-06-22: 500 [IU]
  Filled 2019-06-22: qty 5

## 2019-06-22 NOTE — Patient Instructions (Signed)
[facility] Discharge Instructions for Patients Receiving Chemotherapy  Today you received the following chemotherapy agents: Keytruda.  To help prevent nausea and vomiting after your treatment, we encourage you to take your nausea medication as directed.   If you develop nausea and vomiting that is not controlled by your nausea medication, call the clinic.   BELOW ARE SYMPTOMS THAT SHOULD BE REPORTED IMMEDIATELY:  *FEVER GREATER THAN 100.5 F  *CHILLS WITH OR WITHOUT FEVER  NAUSEA AND VOMITING THAT IS NOT CONTROLLED WITH YOUR NAUSEA MEDICATION  *UNUSUAL SHORTNESS OF BREATH  *UNUSUAL BRUISING OR BLEEDING  TENDERNESS IN MOUTH AND THROAT WITH OR WITHOUT PRESENCE OF ULCERS  *URINARY PROBLEMS  *BOWEL PROBLEMS  UNUSUAL RASH Items with * indicate a potential emergency and should be followed up as soon as possible.  Feel free to call the clinic should you have any questions or concerns. The clinic phone number is [phone].  Please show the CHEMO ALERT CARD at check-in to the Emergency Department and triage nurse.  Denosumab injection What is this medicine? DENOSUMAB (den oh sue mab) slows bone breakdown. Prolia is used to treat osteoporosis in women after menopause and in men, and in people who are taking corticosteroids for 6 months or more. Xgeva is used to treat a high calcium level due to cancer and to prevent bone fractures and other bone problems caused by multiple myeloma or cancer bone metastases. Xgeva is also used to treat giant cell tumor of the bone. This medicine may be used for other purposes; ask your health care provider or pharmacist if you have questions. COMMON BRAND NAME(S): Prolia, XGEVA What should I tell my health care provider before I take this medicine? They need to know if you have any of these conditions:  dental disease  having surgery or tooth extraction  infection  kidney disease  low levels of calcium or Vitamin D in the  blood  malnutrition  on hemodialysis  skin conditions or sensitivity  thyroid or parathyroid disease  an unusual reaction to denosumab, other medicines, foods, dyes, or preservatives  pregnant or trying to get pregnant  breast-feeding How should I use this medicine? This medicine is for injection under the skin. It is given by a health care professional in a hospital or clinic setting. A special MedGuide will be given to you before each treatment. Be sure to read this information carefully each time. For Prolia, talk to your pediatrician regarding the use of this medicine in children. Special care may be needed. For Xgeva, talk to your pediatrician regarding the use of this medicine in children. While this drug may be prescribed for children as young as 13 years for selected conditions, precautions do apply. Overdosage: If you think you have taken too much of this medicine contact a poison control center or emergency room at once. NOTE: This medicine is only for you. Do not share this medicine with others. What if I miss a dose? It is important not to miss your dose. Call your doctor or health care professional if you are unable to keep an appointment. What may interact with this medicine? Do not take this medicine with any of the following medications:  other medicines containing denosumab This medicine may also interact with the following medications:  medicines that lower your chance of fighting infection  steroid medicines like prednisone or cortisone This list may not describe all possible interactions. Give your health care provider a list of all the medicines, herbs, non-prescription   drugs, or dietary supplements you use. Also tell them if you smoke, drink alcohol, or use illegal drugs. Some items may interact with your medicine. What should I watch for while using this medicine? Visit your doctor or health care professional for regular checks on your progress. Your doctor or  health care professional may order blood tests and other tests to see how you are doing. Call your doctor or health care professional for advice if you get a fever, chills or sore throat, or other symptoms of a cold or flu. Do not treat yourself. This drug may decrease your body's ability to fight infection. Try to avoid being around people who are sick. You should make sure you get enough calcium and vitamin D while you are taking this medicine, unless your doctor tells you not to. Discuss the foods you eat and the vitamins you take with your health care professional. See your dentist regularly. Brush and floss your teeth as directed. Before you have any dental work done, tell your dentist you are receiving this medicine. Do not become pregnant while taking this medicine or for 5 months after stopping it. Talk with your doctor or health care professional about your birth control options while taking this medicine. Women should inform their doctor if they wish to become pregnant or think they might be pregnant. There is a potential for serious side effects to an unborn child. Talk to your health care professional or pharmacist for more information. What side effects may I notice from receiving this medicine? Side effects that you should report to your doctor or health care professional as soon as possible:  allergic reactions like skin rash, itching or hives, swelling of the face, lips, or tongue  bone pain  breathing problems  dizziness  jaw pain, especially after dental work  redness, blistering, peeling of the skin  signs and symptoms of infection like fever or chills; cough; sore throat; pain or trouble passing urine  signs of low calcium like fast heartbeat, muscle cramps or muscle pain; pain, tingling, numbness in the hands or feet; seizures  unusual bleeding or bruising  unusually weak or tired Side effects that usually do not require medical attention (report to your doctor or  health care professional if they continue or are bothersome):  constipation  diarrhea  headache  joint pain  loss of appetite  muscle pain  runny nose  tiredness  upset stomach This list may not describe all possible side effects. Call your doctor for medical advice about side effects. You may report side effects to FDA at 1-800-FDA-1088. Where should I keep my medicine? This medicine is only given in a clinic, doctor's office, or other health care setting and will not be stored at home. NOTE: This sheet is a summary. It may not cover all possible information. If you have questions about this medicine, talk to your doctor, pharmacist, or health care provider.  2020 [employer]/Gold Standard (2018-01-01 16:10:44)

## 2019-06-26 ENCOUNTER — Other Ambulatory Visit: Payer: Self-pay | Admitting: Hematology

## 2019-06-29 ENCOUNTER — Other Ambulatory Visit: Payer: Self-pay | Admitting: *Deleted

## 2019-06-29 MED ORDER — MORPHINE SULFATE 15 MG PO TABS: 15.0000 mg | ORAL_TABLET | ORAL | 0 refills | Status: DC | PRN

## 2019-06-29 MED ORDER — FENTANYL 50 MCG/HR TD PT72: 1.0000 | MEDICATED_PATCH | TRANSDERMAL | 0 refills | Status: DC

## 2019-07-13 ENCOUNTER — Inpatient Hospital Stay: Payer: Medicare Other

## 2019-07-13 ENCOUNTER — Inpatient Hospital Stay (HOSPITAL_BASED_OUTPATIENT_CLINIC_OR_DEPARTMENT_OTHER): Payer: Medicare Other | Admitting: Hematology

## 2019-07-13 ENCOUNTER — Other Ambulatory Visit: Payer: Self-pay

## 2019-07-13 ENCOUNTER — Inpatient Hospital Stay: Payer: Medicare Other | Attending: Hematology

## 2019-07-13 VITALS — BP 117/56 | HR 66 | Temp 98.9°F | Resp 18 | Ht 69.0 in | Wt 121.1 lb

## 2019-07-13 DIAGNOSIS — C7951 Secondary malignant neoplasm of bone: Secondary | ICD-10-CM

## 2019-07-13 DIAGNOSIS — Z7189 Other specified counseling: Secondary | ICD-10-CM

## 2019-07-13 DIAGNOSIS — C3492 Malignant neoplasm of unspecified part of left bronchus or lung: Secondary | ICD-10-CM

## 2019-07-13 DIAGNOSIS — G893 Neoplasm related pain (acute) (chronic): Secondary | ICD-10-CM | POA: Insufficient documentation

## 2019-07-13 DIAGNOSIS — Z79899 Other long term (current) drug therapy: Secondary | ICD-10-CM | POA: Insufficient documentation

## 2019-07-13 DIAGNOSIS — I252 Old myocardial infarction: Secondary | ICD-10-CM | POA: Diagnosis not present

## 2019-07-13 DIAGNOSIS — Z9861 Coronary angioplasty status: Secondary | ICD-10-CM | POA: Diagnosis not present

## 2019-07-13 DIAGNOSIS — I10 Essential (primary) hypertension: Secondary | ICD-10-CM | POA: Diagnosis not present

## 2019-07-13 DIAGNOSIS — E785 Hyperlipidemia, unspecified: Secondary | ICD-10-CM | POA: Diagnosis not present

## 2019-07-13 DIAGNOSIS — F329 Major depressive disorder, single episode, unspecified: Secondary | ICD-10-CM | POA: Diagnosis not present

## 2019-07-13 DIAGNOSIS — I2510 Atherosclerotic heart disease of native coronary artery without angina pectoris: Secondary | ICD-10-CM

## 2019-07-13 DIAGNOSIS — C3412 Malignant neoplasm of upper lobe, left bronchus or lung: Secondary | ICD-10-CM | POA: Diagnosis not present

## 2019-07-13 DIAGNOSIS — Z85828 Personal history of other malignant neoplasm of skin: Secondary | ICD-10-CM | POA: Insufficient documentation

## 2019-07-13 DIAGNOSIS — Z85858 Personal history of malignant neoplasm of other endocrine glands: Secondary | ICD-10-CM | POA: Diagnosis not present

## 2019-07-13 DIAGNOSIS — Z5112 Encounter for antineoplastic immunotherapy: Secondary | ICD-10-CM

## 2019-07-13 DIAGNOSIS — R5383 Other fatigue: Secondary | ICD-10-CM | POA: Diagnosis not present

## 2019-07-13 DIAGNOSIS — Z923 Personal history of irradiation: Secondary | ICD-10-CM | POA: Insufficient documentation

## 2019-07-13 DIAGNOSIS — C799 Secondary malignant neoplasm of unspecified site: Secondary | ICD-10-CM

## 2019-07-13 LAB — CBC WITH DIFFERENTIAL/PLATELET
Abs Immature Granulocytes: 0.02 10*3/uL (ref 0.00–0.07)
Basophils Absolute: 0 10*3/uL (ref 0.0–0.1)
Basophils Relative: 0 %
Eosinophils Absolute: 0.1 10*3/uL (ref 0.0–0.5)
Eosinophils Relative: 1 %
HCT: 36.2 % — ABNORMAL LOW (ref 39.0–52.0)
Hemoglobin: 12.3 g/dL — ABNORMAL LOW (ref 13.0–17.0)
Immature Granulocytes: 0 %
Lymphocytes Relative: 7 %
Lymphs Abs: 0.5 10*3/uL — ABNORMAL LOW (ref 0.7–4.0)
MCH: 33.6 pg (ref 26.0–34.0)
MCHC: 34 g/dL (ref 30.0–36.0)
MCV: 98.9 fL (ref 80.0–100.0)
Monocytes Absolute: 0.5 10*3/uL (ref 0.1–1.0)
Monocytes Relative: 7 %
Neutro Abs: 5.7 10*3/uL (ref 1.7–7.7)
Neutrophils Relative %: 85 %
Platelets: 123 10*3/uL — ABNORMAL LOW (ref 150–400)
RBC: 3.66 MIL/uL — ABNORMAL LOW (ref 4.22–5.81)
RDW: 12.5 % (ref 11.5–15.5)
WBC: 6.9 10*3/uL (ref 4.0–10.5)
nRBC: 0 % (ref 0.0–0.2)

## 2019-07-13 LAB — CMP (CANCER CENTER ONLY)
ALT: 20 U/L (ref 0–44)
AST: 20 U/L (ref 15–41)
Albumin: 3.9 g/dL (ref 3.5–5.0)
Alkaline Phosphatase: 51 U/L (ref 38–126)
Anion gap: 9 (ref 5–15)
BUN: 20 mg/dL (ref 8–23)
CO2: 26 mmol/L (ref 22–32)
Calcium: 9.4 mg/dL (ref 8.9–10.3)
Chloride: 106 mmol/L (ref 98–111)
Creatinine: 0.84 mg/dL (ref 0.61–1.24)
GFR, Est AFR Am: >60 mL/min (ref >60)
GFR, Estimated: >60 mL/min (ref >60)
Glucose, Bld: 90 mg/dL (ref 70–99)
Potassium: 4 mmol/L (ref 3.5–5.1)
Sodium: 141 mmol/L (ref 135–145)
Total Bilirubin: 0.7 mg/dL (ref 0.3–1.2)
Total Protein: 6.4 g/dL — ABNORMAL LOW (ref 6.5–8.1)

## 2019-07-13 LAB — MAGNESIUM: Magnesium: 1.6 mg/dL — ABNORMAL LOW (ref 1.7–2.4)

## 2019-07-13 MED ORDER — DIPHENHYDRAMINE HCL 50 MG/ML IJ SOLN
25.0000 mg | Freq: Once | INTRAMUSCULAR | Status: AC
  Administered 2019-07-13: 25 mg via INTRAVENOUS

## 2019-07-13 MED ORDER — DENOSUMAB 120 MG/1.7ML ~~LOC~~ SOLN
SUBCUTANEOUS | Status: AC
  Filled 2019-07-13: qty 1.7

## 2019-07-13 MED ORDER — DIPHENHYDRAMINE HCL 50 MG/ML IJ SOLN
INTRAMUSCULAR | Status: AC
  Filled 2019-07-13: qty 1

## 2019-07-13 MED ORDER — FAMOTIDINE IN NACL 20-0.9 MG/50ML-% IV SOLN
20.0000 mg | Freq: Once | INTRAVENOUS | Status: AC
  Administered 2019-07-13: 20 mg via INTRAVENOUS

## 2019-07-13 MED ORDER — SODIUM CHLORIDE 0.9% FLUSH
10.0000 mL | INTRAVENOUS | Status: DC | PRN
  Administered 2019-07-13: 15:00:00 10 mL
  Filled 2019-07-13: qty 10

## 2019-07-13 MED ORDER — DENOSUMAB 120 MG/1.7ML ~~LOC~~ SOLN
120.0000 mg | Freq: Once | SUBCUTANEOUS | Status: AC
  Administered 2019-07-13: 120 mg via SUBCUTANEOUS

## 2019-07-13 MED ORDER — SODIUM CHLORIDE 0.9% FLUSH
10.0000 mL | INTRAVENOUS | Status: DC | PRN
  Administered 2019-07-13: 11:00:00 10 mL
  Filled 2019-07-13: qty 10

## 2019-07-13 MED ORDER — SODIUM CHLORIDE 0.9 % IV SOLN
200.0000 mg | Freq: Once | INTRAVENOUS | Status: AC
  Administered 2019-07-13: 200 mg via INTRAVENOUS
  Filled 2019-07-13: qty 8

## 2019-07-13 MED ORDER — FAMOTIDINE IN NACL 20-0.9 MG/50ML-% IV SOLN
INTRAVENOUS | Status: AC
  Filled 2019-07-13: qty 50

## 2019-07-13 MED ORDER — HEPARIN SOD (PORK) LOCK FLUSH 100 UNIT/ML IV SOLN
500.0000 [IU] | Freq: Once | INTRAVENOUS | Status: AC | PRN
  Administered 2019-07-13: 15:00:00 500 [IU]
  Filled 2019-07-13: qty 5

## 2019-07-13 MED ORDER — SODIUM CHLORIDE 0.9 % IV SOLN
Freq: Once | INTRAVENOUS | Status: AC
  Administered 2019-07-13: 13:00:00 via INTRAVENOUS
  Filled 2019-07-13: qty 250

## 2019-07-13 NOTE — Patient Instructions (Signed)
[facility] Discharge Instructions for Patients Receiving Chemotherapy  Today you received the following chemotherapy agents: Pembrolizumab  To help prevent nausea and vomiting after your treatment, we encourage you to take your nausea medication as directed.    If you develop nausea and vomiting that is not controlled by your nausea medication, call the clinic.   BELOW ARE SYMPTOMS THAT SHOULD BE REPORTED IMMEDIATELY:  *FEVER GREATER THAN 100.5 F  *CHILLS WITH OR WITHOUT FEVER  NAUSEA AND VOMITING THAT IS NOT CONTROLLED WITH YOUR NAUSEA MEDICATION  *UNUSUAL SHORTNESS OF BREATH  *UNUSUAL BRUISING OR BLEEDING  TENDERNESS IN MOUTH AND THROAT WITH OR WITHOUT PRESENCE OF ULCERS  *URINARY PROBLEMS  *BOWEL PROBLEMS  UNUSUAL RASH Items with * indicate a potential emergency and should be followed up as soon as possible.  Feel free to call the clinic should you have any questions or concerns. The clinic phone number is [phone].  Please show the [place] at check-in to the Emergency Department and triage nurse.  Denosumab injection What is this medicine? DENOSUMAB (den oh sue mab) slows bone breakdown. Prolia is used to treat osteoporosis in women after menopause and in men, and in people who are taking corticosteroids for 6 months or more. Delton See is used to treat a high calcium level due to cancer and to prevent bone fractures and other bone problems caused by multiple myeloma or cancer bone metastases. Delton See is also used to treat giant cell tumor of the bone. This medicine may be used for other purposes; ask your health care provider or pharmacist if you have questions. COMMON BRAND NAME(S): Prolia, XGEVA What should I tell my health care provider before I take this medicine? They need to know if you have any of these conditions:  dental disease  having surgery or tooth extraction  infection  kidney disease  low levels of calcium or Vitamin D in  the blood  malnutrition  on hemodialysis  skin conditions or sensitivity  thyroid or parathyroid disease  an unusual reaction to denosumab, other medicines, foods, dyes, or preservatives  pregnant or trying to get pregnant  breast-feeding How should I use this medicine? This medicine is for injection under the skin. It is given by a health care professional in a hospital or clinic setting. A special MedGuide will be given to you before each treatment. Be sure to read this information carefully each time. For Prolia, talk to your pediatrician regarding the use of this medicine in children. Special care may be needed. For Delton See, talk to your pediatrician regarding the use of this medicine in children. While this drug may be prescribed for children as young as 13 years for selected conditions, precautions do apply. Overdosage: If you think you have taken too much of this medicine contact a poison control center or emergency room at once. NOTE: This medicine is only for you. Do not share this medicine with others. What if I miss a dose? It is important not to miss your dose. Call your doctor or health care professional if you are unable to keep an appointment. What may interact with this medicine? Do not take this medicine with any of the following medications:  other medicines containing denosumab This medicine may also interact with the following medications:  medicines that lower your chance of fighting infection  steroid medicines like prednisone or cortisone This list may not describe all possible interactions. Give your health care provider a list of all the medicines, herbs,  non-prescription drugs, or dietary supplements you use. Also tell them if you smoke, drink alcohol, or use illegal drugs. Some items may interact with your medicine. What should I watch for while using this medicine? Visit your doctor or health care professional for regular checks on your progress. Your  doctor or health care professional may order blood tests and other tests to see how you are doing. Call your doctor or health care professional for advice if you get a fever, chills or sore throat, or other symptoms of a cold or flu. Do not treat yourself. This drug may decrease your body's ability to fight infection. Try to avoid being around people who are sick. You should make sure you get enough calcium and vitamin D while you are taking this medicine, unless your doctor tells you not to. Discuss the foods you eat and the vitamins you take with your health care professional. See your dentist regularly. Brush and floss your teeth as directed. Before you have any dental work done, tell your dentist you are receiving this medicine. Do not become pregnant while taking this medicine or for 5 months after stopping it. Talk with your doctor or health care professional about your birth control options while taking this medicine. Women should inform their doctor if they wish to become pregnant or think they might be pregnant. There is a potential for serious side effects to an unborn child. Talk to your health care professional or pharmacist for more information. What side effects may I notice from receiving this medicine? Side effects that you should report to your doctor or health care professional as soon as possible:  allergic reactions like skin rash, itching or hives, swelling of the face, lips, or tongue  bone pain  breathing problems  dizziness  jaw pain, especially after dental work  redness, blistering, peeling of the skin  signs and symptoms of infection like fever or chills; cough; sore throat; pain or trouble passing urine  signs of low calcium like fast heartbeat, muscle cramps or muscle pain; pain, tingling, numbness in the hands or feet; seizures  unusual bleeding or bruising  unusually weak or tired Side effects that usually do not require medical attention (report to your  doctor or health care professional if they continue or are bothersome):  constipation  diarrhea  headache  joint pain  loss of appetite  muscle pain  runny nose  tiredness  upset stomach This list may not describe all possible side effects. Call your doctor for medical advice about side effects. You may report side effects to FDA at 1-800-FDA-1088. Where should I keep my medicine? This medicine is only given in a clinic, doctor's office, or other health care setting and will not be stored at home. NOTE: This sheet is a summary. It may not cover all possible information. If you have questions about this medicine, talk to your doctor, pharmacist, or health care provider.  2020 [employer]/Gold Standard (2018-01-01 16:10:44)
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
## 2019-07-13 NOTE — Patient Instructions (Signed)

## 2019-07-13 NOTE — Progress Notes (Signed)
Mg level reported to MD. Stated for patient to continue current home dose of MagOx 400mg  daily. Information shared with patient and he verbalized understanding.

## 2019-07-14 ENCOUNTER — Telehealth: Payer: Self-pay | Admitting: Hematology

## 2019-07-14 DIAGNOSIS — D0462 Carcinoma in situ of skin of left upper limb, including shoulder: Secondary | ICD-10-CM | POA: Diagnosis not present

## 2019-07-14 DIAGNOSIS — D485 Neoplasm of uncertain behavior of skin: Secondary | ICD-10-CM | POA: Diagnosis not present

## 2019-07-14 DIAGNOSIS — L578 Other skin changes due to chronic exposure to nonionizing radiation: Secondary | ICD-10-CM | POA: Diagnosis not present

## 2019-07-14 DIAGNOSIS — L570 Actinic keratosis: Secondary | ICD-10-CM | POA: Diagnosis not present

## 2019-07-14 DIAGNOSIS — Z85828 Personal history of other malignant neoplasm of skin: Secondary | ICD-10-CM | POA: Diagnosis not present

## 2019-07-14 DIAGNOSIS — C44329 Squamous cell carcinoma of skin of other parts of face: Secondary | ICD-10-CM | POA: Diagnosis not present

## 2019-07-14 DIAGNOSIS — C44629 Squamous cell carcinoma of skin of left upper limb, including shoulder: Secondary | ICD-10-CM | POA: Diagnosis not present

## 2019-07-14 NOTE — Telephone Encounter (Signed)
Per 11/4 los F/u for next treatment in 3 weeks with labs and MD visit as scheduled

## 2019-07-28 ENCOUNTER — Telehealth: Payer: Self-pay | Admitting: Hematology

## 2019-07-28 NOTE — Telephone Encounter (Signed)
[place] [facility] 11/25 f/u cxd, patient will have lab/port/tx only. Confirmed with wife.

## 2019-08-01 ENCOUNTER — Other Ambulatory Visit: Payer: Self-pay | Admitting: Oncology

## 2019-08-01 MED ORDER — FENTANYL 50 MCG/HR TD PT72: 1.0000 | MEDICATED_PATCH | TRANSDERMAL | 0 refills | Status: DC

## 2019-08-03 ENCOUNTER — Other Ambulatory Visit: Payer: Medicare Other

## 2019-08-03 ENCOUNTER — Inpatient Hospital Stay: Payer: Medicare Other

## 2019-08-03 ENCOUNTER — Other Ambulatory Visit: Payer: Self-pay

## 2019-08-03 ENCOUNTER — Ambulatory Visit: Payer: Medicare Other | Admitting: Hematology

## 2019-08-03 VITALS — BP 122/61 | HR 65 | Temp 98.7°F | Resp 18 | Ht 69.0 in | Wt 124.8 lb

## 2019-08-03 DIAGNOSIS — C7951 Secondary malignant neoplasm of bone: Secondary | ICD-10-CM

## 2019-08-03 DIAGNOSIS — Z79899 Other long term (current) drug therapy: Secondary | ICD-10-CM | POA: Diagnosis not present

## 2019-08-03 DIAGNOSIS — Z7189 Other specified counseling: Secondary | ICD-10-CM

## 2019-08-03 DIAGNOSIS — R5383 Other fatigue: Secondary | ICD-10-CM

## 2019-08-03 DIAGNOSIS — C3412 Malignant neoplasm of upper lobe, left bronchus or lung: Secondary | ICD-10-CM | POA: Diagnosis not present

## 2019-08-03 DIAGNOSIS — C3492 Malignant neoplasm of unspecified part of left bronchus or lung: Secondary | ICD-10-CM

## 2019-08-03 DIAGNOSIS — Z5112 Encounter for antineoplastic immunotherapy: Secondary | ICD-10-CM | POA: Diagnosis not present

## 2019-08-03 DIAGNOSIS — G893 Neoplasm related pain (acute) (chronic): Secondary | ICD-10-CM | POA: Diagnosis not present

## 2019-08-03 DIAGNOSIS — Z85858 Personal history of malignant neoplasm of other endocrine glands: Secondary | ICD-10-CM | POA: Diagnosis not present

## 2019-08-03 DIAGNOSIS — C799 Secondary malignant neoplasm of unspecified site: Secondary | ICD-10-CM

## 2019-08-03 LAB — CBC WITH DIFFERENTIAL/PLATELET
Abs Immature Granulocytes: 0.01 10*3/uL (ref 0.00–0.07)
Basophils Absolute: 0 10*3/uL (ref 0.0–0.1)
Basophils Relative: 0 %
Eosinophils Absolute: 0.2 10*3/uL (ref 0.0–0.5)
Eosinophils Relative: 3 %
HCT: 36 % — ABNORMAL LOW (ref 39.0–52.0)
Hemoglobin: 12.1 g/dL — ABNORMAL LOW (ref 13.0–17.0)
Immature Granulocytes: 0 %
Lymphocytes Relative: 8 %
Lymphs Abs: 0.5 10*3/uL — ABNORMAL LOW (ref 0.7–4.0)
MCH: 33.2 pg (ref 26.0–34.0)
MCHC: 33.6 g/dL (ref 30.0–36.0)
MCV: 98.6 fL (ref 80.0–100.0)
Monocytes Absolute: 0.6 10*3/uL (ref 0.1–1.0)
Monocytes Relative: 9 %
Neutro Abs: 5.5 10*3/uL (ref 1.7–7.7)
Neutrophils Relative %: 80 %
Platelets: 122 10*3/uL — ABNORMAL LOW (ref 150–400)
RBC: 3.65 MIL/uL — ABNORMAL LOW (ref 4.22–5.81)
RDW: 12.8 % (ref 11.5–15.5)
WBC: 6.9 10*3/uL (ref 4.0–10.5)
nRBC: 0 % (ref 0.0–0.2)

## 2019-08-03 LAB — MAGNESIUM: Magnesium: 1.9 mg/dL (ref 1.7–2.4)

## 2019-08-03 LAB — CMP (CANCER CENTER ONLY)
ALT: 16 U/L (ref 0–44)
AST: 17 U/L (ref 15–41)
Albumin: 3.7 g/dL (ref 3.5–5.0)
Alkaline Phosphatase: 49 U/L (ref 38–126)
Anion gap: 8 (ref 5–15)
BUN: 16 mg/dL (ref 8–23)
CO2: 26 mmol/L (ref 22–32)
Calcium: 8.8 mg/dL — ABNORMAL LOW (ref 8.9–10.3)
Chloride: 106 mmol/L (ref 98–111)
Creatinine: 0.82 mg/dL (ref 0.61–1.24)
GFR, Est AFR Am: >60 mL/min (ref >60)
GFR, Estimated: >60 mL/min (ref >60)
Glucose, Bld: 92 mg/dL (ref 70–99)
Potassium: 4.3 mmol/L (ref 3.5–5.1)
Sodium: 140 mmol/L (ref 135–145)
Total Bilirubin: 0.8 mg/dL (ref 0.3–1.2)
Total Protein: 6.1 g/dL — ABNORMAL LOW (ref 6.5–8.1)

## 2019-08-03 LAB — TSH: TSH: 1.514 u[IU]/mL (ref 0.320–4.118)

## 2019-08-03 MED ORDER — FAMOTIDINE IN NACL 20-0.9 MG/50ML-% IV SOLN
20.0000 mg | Freq: Once | INTRAVENOUS | Status: AC
  Administered 2019-08-03: 20 mg via INTRAVENOUS

## 2019-08-03 MED ORDER — DIPHENHYDRAMINE HCL 50 MG/ML IJ SOLN
25.0000 mg | Freq: Once | INTRAMUSCULAR | Status: AC
  Administered 2019-08-03: 11:00:00 25 mg via INTRAVENOUS

## 2019-08-03 MED ORDER — FAMOTIDINE IN NACL 20-0.9 MG/50ML-% IV SOLN
INTRAVENOUS | Status: AC
  Filled 2019-08-03: qty 50

## 2019-08-03 MED ORDER — SODIUM CHLORIDE 0.9 % IV SOLN
200.0000 mg | Freq: Once | INTRAVENOUS | Status: AC
  Administered 2019-08-03: 200 mg via INTRAVENOUS
  Filled 2019-08-03: qty 8

## 2019-08-03 MED ORDER — SODIUM CHLORIDE 0.9% FLUSH
10.0000 mL | INTRAVENOUS | Status: DC | PRN
  Administered 2019-08-03: 10 mL
  Filled 2019-08-03: qty 10

## 2019-08-03 MED ORDER — DIPHENHYDRAMINE HCL 50 MG/ML IJ SOLN
INTRAMUSCULAR | Status: AC
  Filled 2019-08-03: qty 1

## 2019-08-03 MED ORDER — HEPARIN SOD (PORK) LOCK FLUSH 100 UNIT/ML IV SOLN
500.0000 [IU] | Freq: Once | INTRAVENOUS | Status: AC | PRN
  Administered 2019-08-03: 500 [IU]
  Filled 2019-08-03: qty 5

## 2019-08-03 MED ORDER — SODIUM CHLORIDE 0.9 % IV SOLN
Freq: Once | INTRAVENOUS | Status: AC
  Administered 2019-08-03: 11:00:00 via INTRAVENOUS
  Filled 2019-08-03: qty 250

## 2019-08-03 NOTE — Patient Instructions (Signed)
[facility] Discharge Instructions for Patients Receiving Chemotherapy  Today you received the following chemotherapy agents Pembrolizumab (KEYTRUDA).  To help prevent nausea and vomiting after your treatment, we encourage you to take your nausea medication as prescribed.   If you develop nausea and vomiting that is not controlled by your nausea medication, call the clinic.   BELOW ARE SYMPTOMS THAT SHOULD BE REPORTED IMMEDIATELY:  *FEVER GREATER THAN 100.5 F  *CHILLS WITH OR WITHOUT FEVER  NAUSEA AND VOMITING THAT IS NOT CONTROLLED WITH YOUR NAUSEA MEDICATION  *UNUSUAL SHORTNESS OF BREATH  *UNUSUAL BRUISING OR BLEEDING  TENDERNESS IN MOUTH AND THROAT WITH OR WITHOUT PRESENCE OF ULCERS  *URINARY PROBLEMS  *BOWEL PROBLEMS  UNUSUAL RASH Items with * indicate a potential emergency and should be followed up as soon as possible.  Feel free to call the clinic should you have any questions or concerns. The clinic phone number is [phone].  Please show the CHEMO ALERT CARD at check-in to the Emergency Department and triage nurse.  Coronavirus (COVID-19) Are you at risk?  Are you at risk for the Coronavirus (COVID-19)?  To be considered HIGH RISK for Coronavirus (COVID-19), you have to meet the following criteria:  . Traveled to [place], [place], [place], [place] or [place]; or in the [place] to [place], [place], [place], or [place]; and have fever, cough, and shortness of breath within the last 2 weeks of travel OR . Been in close contact with a person diagnosed with COVID-19 within the last 2 weeks and have fever, cough, and shortness of breath . IF YOU DO NOT MEET THESE CRITERIA, YOU ARE CONSIDERED LOW RISK FOR COVID-19.  What to do if you are HIGH RISK for COVID-19?  . If you are having a medical emergency, call 911. . Seek medical care right away. Before you go to a doctor's office, urgent care or emergency department, call ahead and tell  them about your recent travel, contact with someone diagnosed with COVID-19, and your symptoms. You should receive instructions from your physician's office regarding next steps of care.  . When you arrive at healthcare provider, tell the healthcare staff immediately you have returned from visiting [place], [place], [place], [place] or [place]; or traveled in the [place] to [place], [place], [place], or [place]; in the last two weeks or you have been in close contact with a person diagnosed with COVID-19 in the last 2 weeks.   . Tell the health care staff about your symptoms: fever, cough and shortness of breath. . After you have been seen by a medical provider, you will be either: o Tested for (COVID-19) and discharged home on quarantine except to seek medical care if symptoms worsen, and asked to  - Stay home and avoid contact with others until you get your results (4-5 days)  - Avoid travel on public transportation if possible (such as bus, train, or airplane) or o Sent to the Emergency Department by EMS for evaluation, COVID-19 testing, and possible admission depending on your condition and test results.  What to do if you are LOW RISK for COVID-19?  Reduce your risk of any infection by using the same precautions used for avoiding the common cold or flu:  . Wash your hands often with soap and warm water for at least 20 seconds.  If soap and water are not readily available, use an alcohol-based hand sanitizer with at least 60% alcohol.  . If coughing or   sneezing, cover your mouth and nose by coughing or sneezing into the elbow areas of your shirt or coat, into a tissue or into your sleeve (not your hands). . Avoid shaking hands with others and consider head nods or verbal greetings only. . Avoid touching your eyes, nose, or mouth with unwashed hands.  . Avoid close contact with people who are sick. . Avoid places or events with large numbers of people in one location, like concerts or  sporting events. . Carefully consider travel plans you have or are making. . If you are planning any travel outside or inside the [place], visit the CDC's Travelers' Health webpage for the latest health notices. . If you have some symptoms but not all symptoms, continue to monitor at home and seek medical attention if your symptoms worsen. . If you are having a medical emergency, call 911.   ADDITIONAL HEALTHCARE OPTIONS FOR PATIENTS  [facility] / e-Visit: [email or web address]         [facility]: [phone]  [facility]: [phone]                   [facility]: [phone]    

## 2019-08-12 DIAGNOSIS — H2512 Age-related nuclear cataract, left eye: Secondary | ICD-10-CM | POA: Diagnosis not present

## 2019-08-12 DIAGNOSIS — G510 Bell's palsy: Secondary | ICD-10-CM | POA: Diagnosis not present

## 2019-08-12 DIAGNOSIS — C760 Malignant neoplasm of head, face and neck: Secondary | ICD-10-CM | POA: Diagnosis not present

## 2019-08-12 DIAGNOSIS — Z961 Presence of intraocular lens: Secondary | ICD-10-CM | POA: Diagnosis not present

## 2019-08-12 DIAGNOSIS — Z01818 Encounter for other preprocedural examination: Secondary | ICD-10-CM | POA: Diagnosis not present

## 2019-08-24 ENCOUNTER — Inpatient Hospital Stay: Payer: No Typology Code available for payment source | Attending: Hematology

## 2019-08-24 ENCOUNTER — Other Ambulatory Visit: Payer: Self-pay

## 2019-08-24 ENCOUNTER — Inpatient Hospital Stay: Payer: No Typology Code available for payment source

## 2019-08-24 ENCOUNTER — Inpatient Hospital Stay (HOSPITAL_BASED_OUTPATIENT_CLINIC_OR_DEPARTMENT_OTHER): Payer: No Typology Code available for payment source | Admitting: Hematology

## 2019-08-24 ENCOUNTER — Inpatient Hospital Stay: Payer: No Typology Code available for payment source | Admitting: Nutrition

## 2019-08-24 ENCOUNTER — Telehealth: Payer: Self-pay | Admitting: *Deleted

## 2019-08-24 VITALS — BP 99/55 | HR 68 | Temp 99.1°F | Resp 18 | Ht 69.0 in | Wt 125.2 lb

## 2019-08-24 DIAGNOSIS — C3412 Malignant neoplasm of upper lobe, left bronchus or lung: Secondary | ICD-10-CM

## 2019-08-24 DIAGNOSIS — Z5112 Encounter for antineoplastic immunotherapy: Secondary | ICD-10-CM | POA: Insufficient documentation

## 2019-08-24 DIAGNOSIS — Z79899 Other long term (current) drug therapy: Secondary | ICD-10-CM | POA: Insufficient documentation

## 2019-08-24 DIAGNOSIS — G893 Neoplasm related pain (acute) (chronic): Secondary | ICD-10-CM | POA: Insufficient documentation

## 2019-08-24 DIAGNOSIS — C7951 Secondary malignant neoplasm of bone: Secondary | ICD-10-CM

## 2019-08-24 DIAGNOSIS — I10 Essential (primary) hypertension: Secondary | ICD-10-CM | POA: Insufficient documentation

## 2019-08-24 DIAGNOSIS — F17210 Nicotine dependence, cigarettes, uncomplicated: Secondary | ICD-10-CM | POA: Diagnosis not present

## 2019-08-24 DIAGNOSIS — E785 Hyperlipidemia, unspecified: Secondary | ICD-10-CM | POA: Insufficient documentation

## 2019-08-24 DIAGNOSIS — C3492 Malignant neoplasm of unspecified part of left bronchus or lung: Secondary | ICD-10-CM

## 2019-08-24 DIAGNOSIS — Z8551 Personal history of malignant neoplasm of bladder: Secondary | ICD-10-CM | POA: Insufficient documentation

## 2019-08-24 DIAGNOSIS — I2510 Atherosclerotic heart disease of native coronary artery without angina pectoris: Secondary | ICD-10-CM

## 2019-08-24 DIAGNOSIS — Z7189 Other specified counseling: Secondary | ICD-10-CM

## 2019-08-24 DIAGNOSIS — Z9861 Coronary angioplasty status: Secondary | ICD-10-CM

## 2019-08-24 DIAGNOSIS — C799 Secondary malignant neoplasm of unspecified site: Secondary | ICD-10-CM

## 2019-08-24 DIAGNOSIS — I252 Old myocardial infarction: Secondary | ICD-10-CM | POA: Insufficient documentation

## 2019-08-24 LAB — CMP (CANCER CENTER ONLY)
ALT: 25 U/L (ref 0–44)
AST: 25 U/L (ref 15–41)
Albumin: 3.7 g/dL (ref 3.5–5.0)
Alkaline Phosphatase: 54 U/L (ref 38–126)
Anion gap: 7 (ref 5–15)
BUN: 18 mg/dL (ref 8–23)
CO2: 25 mmol/L (ref 22–32)
Calcium: 8.8 mg/dL — ABNORMAL LOW (ref 8.9–10.3)
Chloride: 108 mmol/L (ref 98–111)
Creatinine: 0.85 mg/dL (ref 0.61–1.24)
GFR, Est AFR Am: >60 mL/min (ref >60)
GFR, Estimated: >60 mL/min (ref >60)
Glucose, Bld: 84 mg/dL (ref 70–99)
Potassium: 4.3 mmol/L (ref 3.5–5.1)
Sodium: 140 mmol/L (ref 135–145)
Total Bilirubin: 0.7 mg/dL (ref 0.3–1.2)
Total Protein: 6.2 g/dL — ABNORMAL LOW (ref 6.5–8.1)

## 2019-08-24 LAB — CBC WITH DIFFERENTIAL/PLATELET
Abs Immature Granulocytes: 0.02 10*3/uL (ref 0.00–0.07)
Basophils Absolute: 0 10*3/uL (ref 0.0–0.1)
Basophils Relative: 0 %
Eosinophils Absolute: 0.1 10*3/uL (ref 0.0–0.5)
Eosinophils Relative: 2 %
HCT: 35.7 % — ABNORMAL LOW (ref 39.0–52.0)
Hemoglobin: 11.9 g/dL — ABNORMAL LOW (ref 13.0–17.0)
Immature Granulocytes: 0 %
Lymphocytes Relative: 8 %
Lymphs Abs: 0.5 10*3/uL — ABNORMAL LOW (ref 0.7–4.0)
MCH: 32.9 pg (ref 26.0–34.0)
MCHC: 33.3 g/dL (ref 30.0–36.0)
MCV: 98.6 fL (ref 80.0–100.0)
Monocytes Absolute: 0.7 10*3/uL (ref 0.1–1.0)
Monocytes Relative: 9 %
Neutro Abs: 5.6 10*3/uL (ref 1.7–7.7)
Neutrophils Relative %: 81 %
Platelets: 120 10*3/uL — ABNORMAL LOW (ref 150–400)
RBC: 3.62 MIL/uL — ABNORMAL LOW (ref 4.22–5.81)
RDW: 12.9 % (ref 11.5–15.5)
WBC: 6.9 10*3/uL (ref 4.0–10.5)
nRBC: 0 % (ref 0.0–0.2)

## 2019-08-24 LAB — MAGNESIUM: Magnesium: 1.9 mg/dL (ref 1.7–2.4)

## 2019-08-24 MED ORDER — DENOSUMAB 120 MG/1.7ML ~~LOC~~ SOLN
120.0000 mg | Freq: Once | SUBCUTANEOUS | Status: AC
  Administered 2019-08-24: 120 mg via SUBCUTANEOUS

## 2019-08-24 MED ORDER — SODIUM CHLORIDE 0.9% FLUSH
10.0000 mL | INTRAVENOUS | Status: DC | PRN
  Administered 2019-08-24: 10 mL
  Filled 2019-08-24: qty 10

## 2019-08-24 MED ORDER — FAMOTIDINE IN NACL 20-0.9 MG/50ML-% IV SOLN
20.0000 mg | Freq: Once | INTRAVENOUS | Status: AC
  Administered 2019-08-24: 20 mg via INTRAVENOUS

## 2019-08-24 MED ORDER — MORPHINE SULFATE 15 MG PO TABS: 15.0000 mg | ORAL_TABLET | ORAL | 0 refills | Status: DC | PRN

## 2019-08-24 MED ORDER — DIPHENHYDRAMINE HCL 50 MG/ML IJ SOLN
25.0000 mg | Freq: Once | INTRAMUSCULAR | Status: AC
  Administered 2019-08-24: 11:00:00 25 mg via INTRAVENOUS

## 2019-08-24 MED ORDER — SODIUM CHLORIDE 0.9 % IV SOLN
200.0000 mg | Freq: Once | INTRAVENOUS | Status: AC
  Administered 2019-08-24: 200 mg via INTRAVENOUS
  Filled 2019-08-24: qty 8

## 2019-08-24 MED ORDER — DENOSUMAB 120 MG/1.7ML ~~LOC~~ SOLN
SUBCUTANEOUS | Status: AC
  Filled 2019-08-24: qty 1.7

## 2019-08-24 MED ORDER — HEPARIN SOD (PORK) LOCK FLUSH 100 UNIT/ML IV SOLN
500.0000 [IU] | Freq: Once | INTRAVENOUS | Status: AC | PRN
  Administered 2019-08-24: 500 [IU]
  Filled 2019-08-24: qty 5

## 2019-08-24 MED ORDER — DIPHENHYDRAMINE HCL 50 MG/ML IJ SOLN
INTRAMUSCULAR | Status: AC
  Filled 2019-08-24: qty 1

## 2019-08-24 MED ORDER — SODIUM CHLORIDE 0.9 % IV SOLN
Freq: Once | INTRAVENOUS | Status: AC
  Filled 2019-08-24: qty 250

## 2019-08-24 MED ORDER — DRONABINOL 5 MG PO CAPS: 5.0000 mg | ORAL_CAPSULE | Freq: Two times a day (BID) | ORAL | 0 refills | Status: DC

## 2019-08-24 MED ORDER — FAMOTIDINE IN NACL 20-0.9 MG/50ML-% IV SOLN
INTRAVENOUS | Status: AC
  Filled 2019-08-24: qty 50

## 2019-08-24 NOTE — Progress Notes (Signed)
Nutrition follow up completed with patient during infusion for metastatic lung cancer. Weight is increased slightly to 125.2 pounds from 123.2 pounds on Sept 23. Labs reviewed. Marinol seems to be helping him eat. He denies nutrition problems.  Nutrition Diagnosis: Unintended weight loss improved.  Intervention: Patient educated to continue to eat small amounts of food frequently. Patient to focus on high calorie and high protein foods. Continue Ensure Enlive daily. Monitor.  Monitoring, Evaluation, Goals: Patient will tolerate increased calories and protein to minimize weight loss.  Next Visit: Wednesday, Jan 6 during infusion.  **Disclaimer: This note was dictated with voice recognition software. Similar sounding words can inadvertently be transcribed and this note may contain transcription errors which may not have been corrected upon publication of note.**
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
## 2019-08-24 NOTE — Patient Instructions (Signed)
[facility] Discharge Instructions for Patients Receiving Chemotherapy  Today you received the following chemotherapy agents:  Keytruda.  To help prevent nausea and vomiting after your treatment, we encourage you to take your nausea medication as directed.   If you develop nausea and vomiting that is not controlled by your nausea medication, call the clinic.   BELOW ARE SYMPTOMS THAT SHOULD BE REPORTED IMMEDIATELY:  *FEVER GREATER THAN 100.5 F  *CHILLS WITH OR WITHOUT FEVER  NAUSEA AND VOMITING THAT IS NOT CONTROLLED WITH YOUR NAUSEA MEDICATION  *UNUSUAL SHORTNESS OF BREATH  *UNUSUAL BRUISING OR BLEEDING  TENDERNESS IN MOUTH AND THROAT WITH OR WITHOUT PRESENCE OF ULCERS  *URINARY PROBLEMS  *BOWEL PROBLEMS  UNUSUAL RASH Items with * indicate a potential emergency and should be followed up as soon as possible.  Feel free to call the clinic should you have any questions or concerns. The clinic phone number is [phone].  Please show the CHEMO ALERT CARD at check-in to the Emergency Department and triage nurse.    

## 2019-08-25 ENCOUNTER — Telehealth: Payer: Self-pay | Admitting: Hematology

## 2019-08-25 NOTE — Telephone Encounter (Signed)
Scheduled appt per 12/16 los.  Spoke with pt spouse and she is aware of the appt date and time.  Also gave her the number to central radiology in case they did not hear from them.

## 2019-08-31 ENCOUNTER — Other Ambulatory Visit: Payer: Self-pay | Admitting: *Deleted

## 2019-08-31 MED ORDER — FENTANYL 50 MCG/HR TD PT72: 1.0000 | MEDICATED_PATCH | TRANSDERMAL | 0 refills | Status: DC

## 2019-08-31 NOTE — Telephone Encounter (Signed)
Requested refill of Fentanyl

## 2019-09-14 ENCOUNTER — Inpatient Hospital Stay: Payer: No Typology Code available for payment source | Admitting: Nutrition

## 2019-09-14 ENCOUNTER — Other Ambulatory Visit: Payer: Self-pay

## 2019-09-14 ENCOUNTER — Inpatient Hospital Stay: Payer: No Typology Code available for payment source | Attending: Hematology

## 2019-09-14 ENCOUNTER — Other Ambulatory Visit: Payer: Medicare Other

## 2019-09-14 ENCOUNTER — Inpatient Hospital Stay: Payer: No Typology Code available for payment source

## 2019-09-14 ENCOUNTER — Ambulatory Visit: Payer: Medicare Other | Admitting: Hematology

## 2019-09-14 VITALS — BP 114/59 | HR 66 | Temp 99.1°F | Resp 18 | Wt 126.0 lb

## 2019-09-14 DIAGNOSIS — C7951 Secondary malignant neoplasm of bone: Secondary | ICD-10-CM | POA: Diagnosis present

## 2019-09-14 DIAGNOSIS — Z5112 Encounter for antineoplastic immunotherapy: Secondary | ICD-10-CM | POA: Insufficient documentation

## 2019-09-14 DIAGNOSIS — C3492 Malignant neoplasm of unspecified part of left bronchus or lung: Secondary | ICD-10-CM

## 2019-09-14 DIAGNOSIS — Z7189 Other specified counseling: Secondary | ICD-10-CM

## 2019-09-14 DIAGNOSIS — C3412 Malignant neoplasm of upper lobe, left bronchus or lung: Secondary | ICD-10-CM | POA: Insufficient documentation

## 2019-09-14 DIAGNOSIS — C799 Secondary malignant neoplasm of unspecified site: Secondary | ICD-10-CM

## 2019-09-14 LAB — CBC WITH DIFFERENTIAL/PLATELET
Abs Immature Granulocytes: 0.02 10*3/uL (ref 0.00–0.07)
Basophils Absolute: 0 10*3/uL (ref 0.0–0.1)
Basophils Relative: 0 %
Eosinophils Absolute: 0.1 10*3/uL (ref 0.0–0.5)
Eosinophils Relative: 2 %
HCT: 36 % — ABNORMAL LOW (ref 39.0–52.0)
Hemoglobin: 12.1 g/dL — ABNORMAL LOW (ref 13.0–17.0)
Immature Granulocytes: 0 %
Lymphocytes Relative: 7 %
Lymphs Abs: 0.6 10*3/uL — ABNORMAL LOW (ref 0.7–4.0)
MCH: 33.2 pg (ref 26.0–34.0)
MCHC: 33.6 g/dL (ref 30.0–36.0)
MCV: 98.6 fL (ref 80.0–100.0)
Monocytes Absolute: 0.7 10*3/uL (ref 0.1–1.0)
Monocytes Relative: 9 %
Neutro Abs: 6.1 10*3/uL (ref 1.7–7.7)
Neutrophils Relative %: 82 %
Platelets: 133 10*3/uL — ABNORMAL LOW (ref 150–400)
RBC: 3.65 MIL/uL — ABNORMAL LOW (ref 4.22–5.81)
RDW: 13 % (ref 11.5–15.5)
WBC: 7.6 10*3/uL (ref 4.0–10.5)
nRBC: 0 % (ref 0.0–0.2)

## 2019-09-14 LAB — MAGNESIUM: Magnesium: 2 mg/dL (ref 1.7–2.4)

## 2019-09-14 LAB — CMP (CANCER CENTER ONLY)
ALT: 15 U/L (ref 0–44)
AST: 19 U/L (ref 15–41)
Albumin: 3.9 g/dL (ref 3.5–5.0)
Alkaline Phosphatase: 54 U/L (ref 38–126)
Anion gap: 8 (ref 5–15)
BUN: 22 mg/dL (ref 8–23)
CO2: 26 mmol/L (ref 22–32)
Calcium: 8.8 mg/dL — ABNORMAL LOW (ref 8.9–10.3)
Chloride: 106 mmol/L (ref 98–111)
Creatinine: 0.84 mg/dL (ref 0.61–1.24)
GFR, Est AFR Am: >60 mL/min (ref >60)
GFR, Estimated: >60 mL/min (ref >60)
Glucose, Bld: 89 mg/dL (ref 70–99)
Potassium: 4.4 mmol/L (ref 3.5–5.1)
Sodium: 140 mmol/L (ref 135–145)
Total Bilirubin: 0.5 mg/dL (ref 0.3–1.2)
Total Protein: 6.5 g/dL (ref 6.5–8.1)

## 2019-09-14 MED ORDER — SODIUM CHLORIDE 0.9 % IV SOLN
200.0000 mg | Freq: Once | INTRAVENOUS | Status: AC
  Administered 2019-09-14: 200 mg via INTRAVENOUS
  Filled 2019-09-14: qty 8

## 2019-09-14 MED ORDER — FAMOTIDINE IN NACL 20-0.9 MG/50ML-% IV SOLN
INTRAVENOUS | Status: AC
  Filled 2019-09-14: qty 50

## 2019-09-14 MED ORDER — DIPHENHYDRAMINE HCL 50 MG/ML IJ SOLN
25.0000 mg | Freq: Once | INTRAMUSCULAR | Status: AC
  Administered 2019-09-14: 25 mg via INTRAVENOUS

## 2019-09-14 MED ORDER — DIPHENHYDRAMINE HCL 50 MG/ML IJ SOLN
INTRAMUSCULAR | Status: AC
  Filled 2019-09-14: qty 1

## 2019-09-14 MED ORDER — SODIUM CHLORIDE 0.9% FLUSH
10.0000 mL | INTRAVENOUS | Status: DC | PRN
  Administered 2019-09-14: 10 mL
  Filled 2019-09-14: qty 10

## 2019-09-14 MED ORDER — FAMOTIDINE IN NACL 20-0.9 MG/50ML-% IV SOLN
20.0000 mg | Freq: Once | INTRAVENOUS | Status: AC
  Administered 2019-09-14: 11:00:00 20 mg via INTRAVENOUS

## 2019-09-14 MED ORDER — SODIUM CHLORIDE 0.9 % IV SOLN
Freq: Once | INTRAVENOUS | Status: AC
  Filled 2019-09-14: qty 250

## 2019-09-14 MED ORDER — HEPARIN SOD (PORK) LOCK FLUSH 100 UNIT/ML IV SOLN
500.0000 [IU] | Freq: Once | INTRAVENOUS | Status: AC | PRN
  Administered 2019-09-14: 500 [IU]
  Filled 2019-09-14: qty 5

## 2019-09-14 NOTE — Patient Instructions (Signed)
[facility] Discharge Instructions for Patients Receiving Chemotherapy  Today you received the following chemotherapy agents:  Keytruda.  To help prevent nausea and vomiting after your treatment, we encourage you to take your nausea medication as directed.   If you develop nausea and vomiting that is not controlled by your nausea medication, call the clinic.   BELOW ARE SYMPTOMS THAT SHOULD BE REPORTED IMMEDIATELY:  *FEVER GREATER THAN 100.5 F  *CHILLS WITH OR WITHOUT FEVER  NAUSEA AND VOMITING THAT IS NOT CONTROLLED WITH YOUR NAUSEA MEDICATION  *UNUSUAL SHORTNESS OF BREATH  *UNUSUAL BRUISING OR BLEEDING  TENDERNESS IN MOUTH AND THROAT WITH OR WITHOUT PRESENCE OF ULCERS  *URINARY PROBLEMS  *BOWEL PROBLEMS  UNUSUAL RASH Items with * indicate a potential emergency and should be followed up as soon as possible.  Feel free to call the clinic should you have any questions or concerns. The clinic phone number is [phone].  Please show the CHEMO ALERT CARD at check-in to the Emergency Department and triage nurse.    

## 2019-09-14 NOTE — Progress Notes (Signed)
Nutrition follow-up completed with patient receiving treatment for metastatic lung cancer. Weight was documented as 126 pounds January 6 which is stable overall. Patient reports he no longer drinks Ensure because it is too thick.  He prefers [employer]. Reports he does have some constipation which is resolved when he takes a stool softener. He has no questions or concerns.  Nutrition diagnosis: Unintended weight loss is stable.  Intervention: Patient was educated to continue strategies for adequate calorie and protein intake for weight maintenance/weight gain. Patient will continue to focus on high-calorie, high-protein foods. Continue Carnation breakfast essentials as tolerated.  Monitoring, evaluation, goals: Patient will continue to work to increase calories and protein to minimize weight loss.  Next visit: Wednesday, February 17 during infusion.  **Disclaimer: This note was dictated with voice recognition software. Similar sounding words can inadvertently be transcribed and this note may contain transcription errors which may not have been corrected upon publication of note.**

## 2019-09-22 DIAGNOSIS — H2512 Age-related nuclear cataract, left eye: Secondary | ICD-10-CM | POA: Diagnosis not present

## 2019-09-22 DIAGNOSIS — H25812 Combined forms of age-related cataract, left eye: Secondary | ICD-10-CM | POA: Diagnosis not present

## 2019-09-27 ENCOUNTER — Other Ambulatory Visit: Payer: Self-pay | Admitting: Hematology

## 2019-09-27 MED ORDER — FENTANYL 50 MCG/HR TD PT72: 1.0000 | MEDICATED_PATCH | TRANSDERMAL | 0 refills | Status: DC

## 2019-09-27 MED ORDER — MORPHINE SULFATE 15 MG PO TABS: 15.0000 mg | ORAL_TABLET | ORAL | 0 refills | Status: DC | PRN

## 2019-09-28 ENCOUNTER — Other Ambulatory Visit: Payer: Self-pay

## 2019-09-28 ENCOUNTER — Encounter (HOSPITAL_COMMUNITY): Payer: Self-pay

## 2019-09-28 ENCOUNTER — Ambulatory Visit (HOSPITAL_COMMUNITY)
Admission: RE | Admit: 2019-09-28 | Discharge: 2019-09-28 | Disposition: A | Discharge status: Home / Self Care | Payer: Medicare Other | Source: Ambulatory Visit | Attending: Hematology | Admitting: Hematology

## 2019-09-28 DIAGNOSIS — C7951 Secondary malignant neoplasm of bone: Secondary | ICD-10-CM | POA: Diagnosis not present

## 2019-09-28 DIAGNOSIS — C3492 Malignant neoplasm of unspecified part of left bronchus or lung: Secondary | ICD-10-CM | POA: Diagnosis not present

## 2019-09-28 DIAGNOSIS — C3490 Malignant neoplasm of unspecified part of unspecified bronchus or lung: Secondary | ICD-10-CM | POA: Diagnosis not present

## 2019-09-28 HISTORY — DX: Malignant neoplasm of unspecified part of unspecified bronchus or lung: C34.90

## 2019-09-29 ENCOUNTER — Other Ambulatory Visit: Payer: Self-pay | Admitting: Hematology

## 2019-10-01 ENCOUNTER — Other Ambulatory Visit: Payer: Self-pay | Admitting: Nurse Practitioner

## 2019-10-04 NOTE — Progress Notes (Signed)
HEMATOLOGY/ONCOLOGY CLINIC NOTE  Date of Service: 10/05/2019  Patient Care Team: Kyle Foster as PCP - General (Family Medicine) Kyle Harp, MD as PCP - Cardiology (Cardiology) Kyle Genera, MD as Consulting Physician (Hematology) Kyle Seeds, MD as Consulting Physician (Pulmonary Disease) Kyle Aloe, MD as Consulting Physician (Urology)  CHIEF COMPLAINTS/PURPOSE OF CONSULTATION:  F/u for continued management of metastatic Squamous cell lung cancer  HISTORY OF PRESENTING ILLNESS:    Kyle Foster is a wonderful 74 y.o. male who has been referred to [place] by Kyle Foster for evaluation and management of Lung Mass concerning for primary lung cancer.   he pt reports he has been having back pain on and off for about 3 months and was being worked up at the [facility] in [place] by his PCP . He reports it was being maanged as MSK pain and he was referred to PT but the symptoms got progressively worse. He presented to the ED on 10/14/18 with SOB and midsternal chest pain, neck pain, and back pain which had been constant for the last 2-3 weeks.   Of note prior to the patient's visit today, pt has had a CTA Chest completed on 10/14/18 with results revealing 5.6 cm left upper lobe lesion with chest wall and thoracic spine invasion, possible epidural extension of tumor. 2. 2.7 cm left hilar mass occluding the left lower lobe pulmonary artery branch and probably attenuating or occluding the inferior left pulmonary vein. 3. Left lower lobe pulmonary nodules possibly metastatic. 4. Negative for acute PE or thoracic aortic dissection. 5. Coronary and Aortic Atherosclerosis.  Most recent lab results (10/15/18) of CBC is as follows: all values are WNL except for RBC at 3.76, [name], [name], Glucose at 111.  He subsequently had an MRI of the T spine which showed Large cavitary mass arising in the superior aspect of the left hilum and extending into the  posteromedial aspect of the left upper lobe invading the left side of the T5 and T6 vertebra and extending into the spinal canal with a slight mass effect upon the spinal cord. There is a slight pathologic compression fracture of the T6 vertebral body. 2. Probable small metastasis in the T8 vertebral body. 3. Metastatic pulmonary nodules at the left lung base posterolaterally. 4. The mass destroys the posterior aspects of the left fifth and sixth ribs.  Patient has had a h/o localized bladder cancer s/p TURBT in 2009. 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   Interval History:   Kyle Foster returns today for management and evaluation of his Squamous Cell Carcinoma Lung Cancer and maintenance Pembrolizumab. We are joined by his wife, Kyle Foster, via phone. The patient's last visit with [place] was on 08/24/2019. The pt reports that he is doing well overall.  The pt reports that he has been well and has noticed no recent changes. Pt is eating well but has not been able to gain weight. He is not having much pain at this time and just reports a tired and achy feeling. He gets constipated about once per week but uses Metamucil which relieves his symptoms. Pt has been sleeping more lately due to the change in weather and staying in the house. He does attempt to get some activity by walking around is yard.   Pt had cataract surgery in his left eye and denies any complications. He had a  squamous cell lesion removed from his left forehead in the interim and will see his Dermatologist again at the beginning of February. He is scheduled to receive the first dose of the COVID19 vaccine on Friday.   Of note since the patient's last visit, pt has had CT Chest ([id number]) completed on 09/28/2019 with results revealing "1. Stable size of cystic and solid lesion invading the vertebral column and posterior left ribs. 2. Stable appearance of paramediastinal  radiation change within the superior segment of left lower lobe. 3. Stable appearance of triangular-shaped subpleural density within the posterior apex of left lung which may be related to post treatment change. 4. Aortic atherosclerosis and 3 vessel coronary artery atherosclerotic calcifications."  Lab results today (10/05/19) of CBC w/diff and CMP is as follows: all values are WNL except for RBC at 3.83, Hgb at 12.7, HCT at 38.0, PLT at 130K, Lymphs Abs at 0.6K, Total Protein at 6.4. 10/05/2019 Magnesium at 1.9  On review of systems, pt reports constipation, fatigue, aches and denies skin rashes, diarrhea, unexpected weight loss, new skin lesions, abdominal pain, leg swelling and any other symptoms.   Past Medical History:  Diagnosis Date  . Allergy   . Bladder cancer ([place]) 10/2008  . CAD (coronary artery disease)   . Cancer of parotid gland ([place]) 12/2009   "squamous cell cancer attached to it; took the gland out"  . Depression   . GERD (gastroesophageal reflux disease)   . History of chickenpox   . History of kidney stones   . Hyperlipidemia   . Hypertension   . Myocardial infarction ([place]) 06/2008  . Recurrent upper respiratory infection (URI)    09/01/11 saw PCP - Kyle Foster , antibiotic  and prednisone   . Skin cancer    "cut & burned off arms, hands, face, neck" (06/14/2018)  . Squamous cell carcinoma of lung ([place]) dx'd 10/2018   chemo.xrt. immunotherapy    SURGICAL HISTORY: Past Surgical History:  Procedure Laterality Date  . CATARACT EXTRACTION W/ INTRAOCULAR LENS IMPLANT Right 12/2007  . CORONARY ANGIOPLASTY WITH STENT PLACEMENT  06/2008   "3 stents" (06/14/2018)  . CYSTOSCOPY W/ RETROGRADES  09/22/2011   Procedure: CYSTOSCOPY WITH RETROGRADE PYELOGRAM;  Surgeon: Kyle Amass, MD;  Location: [facility];  Service: Urology;  Laterality: Left;  Cystoscopy left Retrograde Pyelogram      (c-arm)   . CYSTOSCOPY WITH BIOPSY  09/22/2011   Procedure: CYSTOSCOPY WITH BIOPSY;  Surgeon:  Kyle Amass, MD;  Location: [facility];  Service: Urology;  Laterality: N/A;   Biopsy  . EXCISIONAL HEMORRHOIDECTOMY  ~ 2006  . EYE SURGERY Left 04/2011   "reconstruction; gold weight in eye lid " (06/14/2018)  . FOOT NEUROMA SURGERY Left   . INGUINAL HERNIA REPAIR Right 02/2018  . IR IMAGING GUIDED PORT INSERTION  11/23/2018  . NM MYOCAR PERF WALL MOTION  07/11/2008   MILD ISCHEMIA IN THE BASL INFERIOR, MID INFERIOR & APICAL INFERIOR REGIONS  . 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 SKIN CANCER EXCISION     "arms, hands, face, neck" (06/14/2018)  . TRANSURETHRAL RESECTION OF BLADDER TUMOR WITH GYRUS (TURBT-GYRUS)  2010    SOCIAL HISTORY: Social History   Socioeconomic History  . Marital status: Married    Spouse name: Not on file  . Number of children: Not on file  . Years of education: Not on file  . Highest education level: Not on  file  Occupational History  . Not on file  Tobacco Use  . Smoking status: Current Every Day Smoker    Packs/day: 0.50    Years: 52.00    Pack years: 26.00    Types: Cigarettes    Start date: 20  . Smokeless tobacco: Never Used  . Tobacco comment: 03/21/19- smoking 10 cigs a day   Substance and Sexual Activity  . Alcohol use: Not Currently  . Drug use: Not Currently  . Sexual activity: Not on file  Other Topics Concern  . Not on file  Social History Narrative  . Not on file   Social Determinants of Health   Financial Resource Strain:   . Difficulty of Paying Living Expenses: Not on file  Food Insecurity:   . Worried About Charity fundraiser in the Last Year: Not on file  . Ran Out of Food in the Last Year: Not on file  Transportation Needs:   . Lack of Transportation (Medical): Not on file  . Lack of Transportation (Non-Medical): Not on file  Physical Activity:   . Days of Exercise per Week: Not on file  . Minutes of Exercise per Session: Not on file  Stress:   . Feeling  of Stress : Not on file  Social Connections:   . Frequency of Communication with Friends and Family: Not on file  . Frequency of Social Gatherings with Friends and Family: Not on file  . Attends Religious Services: Not on file  . Active Member of Clubs or Organizations: Not on file  . Attends Archivist Meetings: Not on file  . Marital Status: Not on file  Intimate Partner Violence:   . Fear of Current or Ex-Partner: Not on file  . Emotionally Abused: Not on file  . Physically Abused: Not on file  . Sexually Abused: Not on file    FAMILY HISTORY: Family History  Problem Relation Age of Onset  . Heart attack Mother 6  . Cancer Mother        Lung  . Diabetes Mother   . Heart disease Mother   . Hypertension Mother   . Stroke Father 18  . Cancer Father        Lung  . Brain cancer Brother   . Cancer Sister        Melanoma of great Toe  . Hyperlipidemia Sister     ALLERGIES:  is allergic to codeine.  MEDICATIONS:  Current Outpatient Medications  Medication Sig Dispense Refill  . albuterol (VENTOLIN HFA) 108 (90 Base) MCG/ACT inhaler Inhale 1-2 puffs into the lungs every 6 (six) hours as needed for wheezing or shortness of breath. 1 Inhaler 2  . atorvastatin (LIPITOR) 80 MG tablet Take 1 tablet (80 mg total) by mouth daily at 6 PM. 90 tablet 3  . calcium carbonate (TUMS - DOSED IN MG ELEMENTAL CALCIUM) 500 MG chewable tablet Chew 1 tablet (200 mg of elemental calcium total) by mouth 3 (three) times daily with meals. 30 tablet 3  . Cholecalciferol (VITAMIN D) 2000 UNITS tablet Take 2,000 Units by mouth 2 (two) times daily.    . clopidogrel (PLAVIX) 75 MG tablet Take 1 tablet (75 mg total) by mouth daily. 90 tablet 3  . DENTA 5000 PLUS 1.1 % CREA dental cream See admin instructions.    Marland Kitchen dronabinol (MARINOL) 5 MG capsule TAKE 1 CAPSULE (5 MG TOTAL) BY MOUTH 2 TIMES DAILY BEFORE A MEAL. 60 capsule 0  . DULoxetine (CYMBALTA) 30  MG capsule TAKE 1 CAPSULE BY MOUTH EVERY DAY  90 capsule 2  . fentaNYL (DURAGESIC) 50 MCG/HR Place 1 patch onto the skin every 3 (three) days. 10 patch 0  . Fluticasone-Umeclidin-Vilant (TRELEGY ELLIPTA) 100-62.5-25 MCG/INH AEPB Inhale 1 puff into the lungs daily. 1 each 6  . Fluticasone-Umeclidin-Vilant (TRELEGY ELLIPTA) 100-62.5-25 MCG/INH AEPB Inhale 1 puff into the lungs daily. 1 each 0  . Hypromellose (ARTIFICIAL TEARS OP) Place 1 drop into both eyes at bedtime.     . lidocaine-prilocaine (EMLA) cream Apply 1 application topically as needed. 30 g 1  . LORazepam (ATIVAN) 0.5 MG tablet Take 1 tablet (0.5 mg total) by mouth every 6 (six) hours as needed (Nausea or vomiting). 30 tablet 0  . magnesium oxide (MAG-OX) 400 MG tablet TAKE 1 TABLET BY MOUTH EVERY DAY 90 tablet 0  . metoprolol tartrate (LOPRESSOR) 25 MG tablet Take 1 tablet (25 mg total) by mouth 2 (two) times daily. 180 tablet 3  . morphine (MSIR) 15 MG tablet Take 1-2 tablets (15-30 mg total) by mouth every 4 (four) hours as needed for moderate pain or severe pain. 120 tablet 0  . ondansetron (ZOFRAN) 8 MG tablet TAKE 1 TABLET (8 MG TOTAL) BY MOUTH EVERY 8 (EIGHT) HOURS AS NEEDED FOR NAUSEA OR VOMITING. 30 tablet 1  . pantoprazole (PROTONIX) 40 MG tablet Take 1 tablet (40 mg total) by mouth daily. 90 tablet 3  . umeclidinium bromide (INCRUSE ELLIPTA) 62.5 MCG/INH AEPB Inhale 1 puff into the lungs daily. 1 each 3   No current facility-administered medications for this visit.    REVIEW OF SYSTEMS:   A 10+ POINT REVIEW OF SYSTEMS WAS OBTAINED including neurology, dermatology, psychiatry, cardiac, respiratory, lymph, extremities, GI, GU, Musculoskeletal, constitutional, breasts, reproductive, HEENT.  All pertinent positives are noted in the HPI.  All others are negative.   PHYSICAL EXAMINATION: ECOG PERFORMANCE STATUS: 2 - Symptomatic, <50% confined to bed  Vitals:   10/05/19 0937  BP: (!) 113/53  Pulse: 68  Resp: 18  Temp: 99 F (37.2 C)  SpO2: 98%   Filed Weights    10/05/19 0937  Weight: 125 lb 9.6 oz (57 kg)   .Body mass index is 18.55 kg/m.   GENERAL:alert, in no acute distress and comfortable SKIN: no acute rashes, no significant lesions EYES: conjunctiva are pink and non-injected, sclera anicteric OROPHARYNX: MMM, no exudates, no oropharyngeal erythema or ulceration NECK: supple, no JVD LYMPH:  no palpable lymphadenopathy in the cervical, axillary or inguinal regions LUNGS: clear to auscultation b/l with normal respiratory effort HEART: regular rate & rhythm ABDOMEN:  normoactive bowel sounds , non tender, not distended. No palpable hepatosplenomegaly.  Extremity: no pedal edema PSYCH: alert & oriented x 3 with fluent speech NEURO: no focal motor/sensory deficits  LABORATORY DATA:  I have reviewed the data as listed  . CBC Latest Ref Rng & Units 10/05/2019 09/14/2019 08/24/2019  WBC 4.0 - 10.5 K/uL 7.6 7.6 6.9  Hemoglobin 13.0 - 17.0 g/dL 12.7(L) 12.1(L) 11.9(L)  Hematocrit 39.0 - 52.0 % 38.0(L) 36.0(L) 35.7(L)  Platelets 150 - 400 K/uL 130(L) 133(L) 120(L)    . CMP Latest Ref Rng & Units 10/05/2019 09/14/2019 08/24/2019  Glucose 70 - 99 mg/dL 89 89 84  BUN 8 - 23 mg/dL 20 22 18   Creatinine 0.61 - 1.24 mg/dL 0.96 0.84 0.85  Sodium 135 - 145 mmol/L 140 140 140  Potassium 3.5 - 5.1 mmol/L 4.2 4.4 4.3  Chloride 98 - 111 mmol/L 104 106  108  CO2 22 - 32 mmol/L 29 26 25   Calcium 8.9 - 10.3 mg/dL 9.2 8.8(L) 8.8(L)  Total Protein 6.5 - 8.1 g/dL 6.4(L) 6.5 6.2(L)  Total Bilirubin 0.3 - 1.2 mg/dL 0.6 0.5 0.7  Alkaline Phos 38 - 126 U/L 51 54 54  AST 15 - 41 U/L 20 19 25   ALT 0 - 44 U/L 18 15 25         RADIOGRAPHIC STUDIES: I have personally reviewed the radiological images as listed and agreed with the findings in the report. CT Chest Wo Contrast  Result Date: 09/28/2019 CLINICAL DATA:  Squamous cell carcinoma of the lung with metastatic disease to the spine. Immunotherapy in progress. Status post XRT. EXAM: CT CHEST WITHOUT CONTRAST  TECHNIQUE: Multidetector CT imaging of the chest was performed following the standard protocol without IV contrast. COMPARISON:  05/30/2019 FINDINGS: Cardiovascular: The heart size appears within normal limits. No pericardial effusion identified. Aortic atherosclerosis. No aneurysm. Three vessel coronary artery atherosclerotic calcifications. Mediastinum/Nodes: Normal appearance of the thyroid gland. The trachea is patent and appears midline. Normal appearance of the esophagus. No adenopathy. Lungs/Pleura: Paraseptal and centrilobular emphysema. Large cystic and solid lesion invading the vertebral column and posterior left ribs is again noted. On today's study this measures 6.6 by 3.8 by 3.9 cm (volume = 51 cm^3), image 39/7. Previously this measured 6.3 by 4.3 by 3.6 cm (volume = 51 cm^3). There is surrounding paramediastinal radiation change within the superior segment of left lower lobe. Within the posterior apex of the left lung there is a 2.5 x 1.9 cm triangular-shaped subpleural density, image 19/7. Unchanged from previous exam. This may be related to post treatment change no new lung nodules identified. Upper Abdomen: Stone within upper pole of right kidney is again noted. Large left kidney cyst is again identified. No acute findings. Musculoskeletal: Lytic lesions involving T5 and T6 vertebral bodies as described above. There is involvement of the corresponding left transverse processes and pedicles of these vertebra. Erosive changes are also noted involving the left fifth and sixth ribs, similar to previous exam. IMPRESSION: 1. Stable size of cystic and solid lesion invading the vertebral column and posterior left ribs. 2. Stable appearance of paramediastinal radiation change within the superior segment of left lower lobe. 3. Stable appearance of triangular-shaped subpleural density within the posterior apex of left lung which may be related to post treatment change. 4. Aortic atherosclerosis and 3 vessel  coronary artery atherosclerotic calcifications. Aortic Atherosclerosis (ICD10-I70.0) and Emphysema (ICD10-J43.9). Electronically Signed   By: Kerby Moors M.D.   On: 09/28/2019 14:20     ASSESSMENT & PLAN:  74 y.o. male with  1. 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 10/19/18 Lung biopsy revealed Squamous Cell carcinoma S/p Palliative RT to LUL lung mass with 30Gy in 10 fractions between 10/22/18 and 11/03/18, due to impending cord compression  10/19/18 PDL-1 status report found 30% PDL-1 tumor proportion  11/06/18 MRI Brain revealed No intracranial metastatic disease identified. 2. Mild chronic microvascular ischemic changes and volume loss of the brain.  11/10/18 PET/CT revealed Locally advanced left lung mass, as detailed previously. 2. Left perihilar direct extension versus nodal metastasis. 3. No findings of hypermetabolic distant metastasis. 4. No findings of extrathoracic hypermetabolic metastasis.  02/09/19 CT Chest and Abdomen revealed "Today's study demonstrates a positive response to therapy with slight regression of the primary  lesion in the superior segment of the left lower lobe which again demonstrates direct invasion of the adjacent posteromedial left chest wall and vertebral column, as detailed above. 2. However, there is a new hypovascular lesion in segment 7 of the liver. This is nonspecific and too small to characterize, but given the development compared to prior studies, this is concerning for potential metastasis. This could be definitively characterized with MRI of the abdomen with and without IV gadolinium if clinically appropriate. 3. 5 mm nonobstructive calculus in the right renal collecting system. 4. Aortic atherosclerosis, in addition to 3 vessel coronary artery disease. Assessment for potential risk factor modification, dietary therapy  or pharmacologic therapy may be warranted, if clinically Indicated. 5. There are calcifications of the aortic valve. Echocardiographic correlation for evaluation of potential valvular dysfunction may be warranted if clinically indicated. 6. Additional incidental findings, as above." Discussed that I suspect that the liver finding is likely a cyst, as progression through treatment would be unexpected. Nevertheless, will monitor this finding over time with future scans.  S/p 5 cycles of Carboplatin AUC of 5, 18m/m2 Taxol, and Pembrolizumab with G-CSF support completed on 02/16/19.  03/08/19 ECHO which revealed LV EF of 55-60%  05/30/2019 CT Chest w/o contrast ([id number] which revealed "1. Stable post treatment related changes of prior radiation therapy in the left lung. Chronic destructive changes in T5 and T6 vertebrae as well as the posterior aspects of the left fifth and sixth ribs, similar to the prior study. 2. Diffuse bronchial wall thickening with mild to moderate centrilobular and paraseptal emphysema. 3. Aortic atherosclerosis, in addition to three-vessel coronary artery disease. Please note that although the presence of coronary artery calcium documents the presence of coronary artery disease, the severity of this disease and any potential stenosis cannot be assessed on this non-gated CT examination. Assessment for potential risk factor modification, dietary therapy or pharmacologic therapy may be warranted, if clinically indicated. 4. There are calcifications of the aortic valve and mitral valve/annulus. Echocardiographic correlation for evaluation of potential valvular dysfunction may be warranted if clinically indicated. 5. 6 mm nonobstructive calculus in the upper pole collecting system of the right kidney. 6. Additional incidental findings, as above."  2. 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 Smoker 4. 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  Has regularly followed up with ENT Dr. JFenton Malling6. Cancer related pain   PLAN: -Discussed pt labwork today, 10/05/19; blood chemistries are steady, Hgb has improved, blood counts are nml -Discussed 10/05/2019 Magnesium is WNL at 1.9 -Discussed 09/28/2019 CT Chest ([id number] revealed stable appearance of main mass, some radiation changes in the lung, no new lung nodules, no new bone changes -No lab or clinical or radiographic evidence of Squamous Cell Carcinoma Lung Cancer progression at this time -The pt has no prohibitive toxicities from continuing maintenance Pembrolizumab at this time. -Recommend pt f/u with scheduled appointment for COVID19 vaccine -Continue Xgeva every 6 weeks with every other treatment -Continue to f/u with Dermatology for Squamous Cell Carcinoma  -Will get repeat scans every 3-4 months unless new symptoms -Will see back with every other treatment   FOLLOW UP: -Please schedule next 4 doses of pembrolizumab with labs -MD visit with every other treatment.  Next MD visit in 6 weeks may cancel MD appointment with his  next cycle of treatment. -Continue Xgeva every 6 weeks with every other treatment.    The total time spent in the appt was 20 minutes and more than 50% was on counseling and direct patient cares.  All of the patient's questions were answered with apparent satisfaction. The patient knows to call the clinic with any problems, questions or concerns.  Kyle Lone MD [place] AAHIVMS [facility] Hematology/Oncology Physician [facility]  (Office):       [phone] (Work cell):  [phone] (Fax):           [phone]  10/05/2019 10:25 AM  I, Yevette Edwards, am acting as a scribe for Dr. Sullivan Foster.   .I have reviewed the above documentation for accuracy and completeness, and I  agree with the above. Kyle Genera MD

## 2019-10-05 ENCOUNTER — Inpatient Hospital Stay: Payer: No Typology Code available for payment source

## 2019-10-05 ENCOUNTER — Inpatient Hospital Stay (HOSPITAL_BASED_OUTPATIENT_CLINIC_OR_DEPARTMENT_OTHER): Payer: No Typology Code available for payment source | Admitting: Hematology

## 2019-10-05 ENCOUNTER — Encounter: Payer: Self-pay | Admitting: Hematology

## 2019-10-05 ENCOUNTER — Other Ambulatory Visit: Payer: Self-pay

## 2019-10-05 VITALS — BP 113/53 | HR 68 | Temp 99.0°F | Resp 18 | Ht 69.0 in | Wt 125.6 lb

## 2019-10-05 DIAGNOSIS — Z5112 Encounter for antineoplastic immunotherapy: Secondary | ICD-10-CM

## 2019-10-05 DIAGNOSIS — C799 Secondary malignant neoplasm of unspecified site: Secondary | ICD-10-CM

## 2019-10-05 DIAGNOSIS — C3412 Malignant neoplasm of upper lobe, left bronchus or lung: Secondary | ICD-10-CM

## 2019-10-05 DIAGNOSIS — C3492 Malignant neoplasm of unspecified part of left bronchus or lung: Secondary | ICD-10-CM

## 2019-10-05 DIAGNOSIS — Z7189 Other specified counseling: Secondary | ICD-10-CM

## 2019-10-05 DIAGNOSIS — C7951 Secondary malignant neoplasm of bone: Secondary | ICD-10-CM

## 2019-10-05 LAB — CMP (CANCER CENTER ONLY)
ALT: 18 U/L (ref 0–44)
AST: 20 U/L (ref 15–41)
Albumin: 3.8 g/dL (ref 3.5–5.0)
Alkaline Phosphatase: 51 U/L (ref 38–126)
Anion gap: 7 (ref 5–15)
BUN: 20 mg/dL (ref 8–23)
CO2: 29 mmol/L (ref 22–32)
Calcium: 9.2 mg/dL (ref 8.9–10.3)
Chloride: 104 mmol/L (ref 98–111)
Creatinine: 0.96 mg/dL (ref 0.61–1.24)
GFR, Est AFR Am: >60 mL/min (ref >60)
GFR, Estimated: >60 mL/min (ref >60)
Glucose, Bld: 89 mg/dL (ref 70–99)
Potassium: 4.2 mmol/L (ref 3.5–5.1)
Sodium: 140 mmol/L (ref 135–145)
Total Bilirubin: 0.6 mg/dL (ref 0.3–1.2)
Total Protein: 6.4 g/dL — ABNORMAL LOW (ref 6.5–8.1)

## 2019-10-05 LAB — CBC WITH DIFFERENTIAL/PLATELET
Abs Immature Granulocytes: 0.02 10*3/uL (ref 0.00–0.07)
Basophils Absolute: 0 10*3/uL (ref 0.0–0.1)
Basophils Relative: 1 %
Eosinophils Absolute: 0.3 10*3/uL (ref 0.0–0.5)
Eosinophils Relative: 4 %
HCT: 38 % — ABNORMAL LOW (ref 39.0–52.0)
Hemoglobin: 12.7 g/dL — ABNORMAL LOW (ref 13.0–17.0)
Immature Granulocytes: 0 %
Lymphocytes Relative: 8 %
Lymphs Abs: 0.6 10*3/uL — ABNORMAL LOW (ref 0.7–4.0)
MCH: 33.2 pg (ref 26.0–34.0)
MCHC: 33.4 g/dL (ref 30.0–36.0)
MCV: 99.2 fL (ref 80.0–100.0)
Monocytes Absolute: 0.7 10*3/uL (ref 0.1–1.0)
Monocytes Relative: 9 %
Neutro Abs: 6 10*3/uL (ref 1.7–7.7)
Neutrophils Relative %: 78 %
Platelets: 130 10*3/uL — ABNORMAL LOW (ref 150–400)
RBC: 3.83 MIL/uL — ABNORMAL LOW (ref 4.22–5.81)
RDW: 13 % (ref 11.5–15.5)
WBC: 7.6 10*3/uL (ref 4.0–10.5)
nRBC: 0 % (ref 0.0–0.2)

## 2019-10-05 LAB — MAGNESIUM: Magnesium: 1.9 mg/dL (ref 1.7–2.4)

## 2019-10-05 MED ORDER — FAMOTIDINE IN NACL 20-0.9 MG/50ML-% IV SOLN
INTRAVENOUS | Status: AC
  Filled 2019-10-05: qty 50

## 2019-10-05 MED ORDER — SODIUM CHLORIDE 0.9% FLUSH
10.0000 mL | INTRAVENOUS | Status: DC | PRN
  Administered 2019-10-05: 10 mL
  Filled 2019-10-05: qty 10

## 2019-10-05 MED ORDER — HEPARIN SOD (PORK) LOCK FLUSH 100 UNIT/ML IV SOLN
500.0000 [IU] | Freq: Once | INTRAVENOUS | Status: AC | PRN
  Administered 2019-10-05: 500 [IU]
  Filled 2019-10-05: qty 5

## 2019-10-05 MED ORDER — SODIUM CHLORIDE 0.9 % IV SOLN
Freq: Once | INTRAVENOUS | Status: AC
  Filled 2019-10-05: qty 250

## 2019-10-05 MED ORDER — SODIUM CHLORIDE 0.9 % IV SOLN
200.0000 mg | Freq: Once | INTRAVENOUS | Status: AC
  Administered 2019-10-05: 200 mg via INTRAVENOUS
  Filled 2019-10-05: qty 8

## 2019-10-05 MED ORDER — DENOSUMAB 120 MG/1.7ML ~~LOC~~ SOLN
SUBCUTANEOUS | Status: AC
  Filled 2019-10-05: qty 1.7

## 2019-10-05 MED ORDER — DIPHENHYDRAMINE HCL 50 MG/ML IJ SOLN
25.0000 mg | Freq: Once | INTRAMUSCULAR | Status: AC
  Administered 2019-10-05: 25 mg via INTRAVENOUS

## 2019-10-05 MED ORDER — DENOSUMAB 120 MG/1.7ML ~~LOC~~ SOLN
120.0000 mg | Freq: Once | SUBCUTANEOUS | Status: AC
  Administered 2019-10-05: 120 mg via SUBCUTANEOUS

## 2019-10-05 MED ORDER — DIPHENHYDRAMINE HCL 50 MG/ML IJ SOLN
INTRAMUSCULAR | Status: AC
  Filled 2019-10-05: qty 1

## 2019-10-05 MED ORDER — FAMOTIDINE IN NACL 20-0.9 MG/50ML-% IV SOLN
20.0000 mg | Freq: Once | INTRAVENOUS | Status: AC
  Administered 2019-10-05: 20 mg via INTRAVENOUS

## 2019-10-05 NOTE — Patient Instructions (Signed)
[facility] Discharge Instructions for Patients Receiving Chemotherapy  Today you received the following chemotherapy agents:  Keytruda.  To help prevent nausea and vomiting after your treatment, we encourage you to take your nausea medication as directed.   If you develop nausea and vomiting that is not controlled by your nausea medication, call the clinic.   BELOW ARE SYMPTOMS THAT SHOULD BE REPORTED IMMEDIATELY:  *FEVER GREATER THAN 100.5 F  *CHILLS WITH OR WITHOUT FEVER  NAUSEA AND VOMITING THAT IS NOT CONTROLLED WITH YOUR NAUSEA MEDICATION  *UNUSUAL SHORTNESS OF BREATH  *UNUSUAL BRUISING OR BLEEDING  TENDERNESS IN MOUTH AND THROAT WITH OR WITHOUT PRESENCE OF ULCERS  *URINARY PROBLEMS  *BOWEL PROBLEMS  UNUSUAL RASH Items with * indicate a potential emergency and should be followed up as soon as possible.  Feel free to call the clinic should you have any questions or concerns. The clinic phone number is [phone].  Please show the CHEMO ALERT CARD at check-in to the Emergency Department and triage nurse.    

## 2019-10-07 ENCOUNTER — Telehealth: Payer: Self-pay | Admitting: Hematology

## 2019-10-07 NOTE — Telephone Encounter (Signed)
Scheduled per  01/27 los, patient has been called and notified.

## 2019-10-18 DIAGNOSIS — Z85828 Personal history of other malignant neoplasm of skin: Secondary | ICD-10-CM | POA: Diagnosis not present

## 2019-10-18 DIAGNOSIS — D485 Neoplasm of uncertain behavior of skin: Secondary | ICD-10-CM | POA: Diagnosis not present

## 2019-10-18 DIAGNOSIS — L570 Actinic keratosis: Secondary | ICD-10-CM | POA: Diagnosis not present

## 2019-10-24 ENCOUNTER — Encounter: Payer: Self-pay | Admitting: *Deleted

## 2019-10-26 ENCOUNTER — Inpatient Hospital Stay: Payer: Medicare Other

## 2019-10-26 ENCOUNTER — Inpatient Hospital Stay: Payer: Medicare Other | Attending: Hematology

## 2019-10-26 ENCOUNTER — Inpatient Hospital Stay: Payer: Medicare Other | Admitting: Nutrition

## 2019-10-26 ENCOUNTER — Inpatient Hospital Stay (HOSPITAL_BASED_OUTPATIENT_CLINIC_OR_DEPARTMENT_OTHER): Payer: Medicare Other | Admitting: Hematology

## 2019-10-26 ENCOUNTER — Other Ambulatory Visit: Payer: Self-pay

## 2019-10-26 VITALS — BP 94/57 | HR 67 | Temp 99.1°F | Resp 18 | Ht 69.0 in | Wt 125.5 lb

## 2019-10-26 DIAGNOSIS — C3412 Malignant neoplasm of upper lobe, left bronchus or lung: Secondary | ICD-10-CM

## 2019-10-26 DIAGNOSIS — C7951 Secondary malignant neoplasm of bone: Secondary | ICD-10-CM

## 2019-10-26 DIAGNOSIS — C3492 Malignant neoplasm of unspecified part of left bronchus or lung: Secondary | ICD-10-CM

## 2019-10-26 DIAGNOSIS — Z5112 Encounter for antineoplastic immunotherapy: Secondary | ICD-10-CM

## 2019-10-26 DIAGNOSIS — Z7189 Other specified counseling: Secondary | ICD-10-CM

## 2019-10-26 LAB — CBC WITH DIFFERENTIAL/PLATELET
Abs Immature Granulocytes: 0.01 10*3/uL (ref 0.00–0.07)
Basophils Absolute: 0 10*3/uL (ref 0.0–0.1)
Basophils Relative: 1 %
Eosinophils Absolute: 0.2 10*3/uL (ref 0.0–0.5)
Eosinophils Relative: 3 %
HCT: 36.6 % — ABNORMAL LOW (ref 39.0–52.0)
Hemoglobin: 12.2 g/dL — ABNORMAL LOW (ref 13.0–17.0)
Immature Granulocytes: 0 %
Lymphocytes Relative: 8 %
Lymphs Abs: 0.5 10*3/uL — ABNORMAL LOW (ref 0.7–4.0)
MCH: 33 pg (ref 26.0–34.0)
MCHC: 33.3 g/dL (ref 30.0–36.0)
MCV: 98.9 fL (ref 80.0–100.0)
Monocytes Absolute: 0.7 10*3/uL (ref 0.1–1.0)
Monocytes Relative: 12 %
Neutro Abs: 4.7 10*3/uL (ref 1.7–7.7)
Neutrophils Relative %: 76 %
Platelets: 144 10*3/uL — ABNORMAL LOW (ref 150–400)
RBC: 3.7 MIL/uL — ABNORMAL LOW (ref 4.22–5.81)
RDW: 13 % (ref 11.5–15.5)
WBC: 6.1 10*3/uL (ref 4.0–10.5)
nRBC: 0 % (ref 0.0–0.2)

## 2019-10-26 LAB — CMP (CANCER CENTER ONLY)
ALT: 17 U/L (ref 0–44)
AST: 17 U/L (ref 15–41)
Albumin: 3.8 g/dL (ref 3.5–5.0)
Alkaline Phosphatase: 51 U/L (ref 38–126)
Anion gap: 7 (ref 5–15)
BUN: 18 mg/dL (ref 8–23)
CO2: 27 mmol/L (ref 22–32)
Calcium: 8.7 mg/dL — ABNORMAL LOW (ref 8.9–10.3)
Chloride: 106 mmol/L (ref 98–111)
Creatinine: 0.95 mg/dL (ref 0.61–1.24)
GFR, Est AFR Am: >60 mL/min (ref >60)
GFR, Estimated: >60 mL/min (ref >60)
Glucose, Bld: 91 mg/dL (ref 70–99)
Potassium: 4.2 mmol/L (ref 3.5–5.1)
Sodium: 140 mmol/L (ref 135–145)
Total Bilirubin: 0.5 mg/dL (ref 0.3–1.2)
Total Protein: 6.5 g/dL (ref 6.5–8.1)

## 2019-10-26 MED ORDER — FAMOTIDINE IN NACL 20-0.9 MG/50ML-% IV SOLN
INTRAVENOUS | Status: AC
  Filled 2019-10-26: qty 50

## 2019-10-26 MED ORDER — FAMOTIDINE IN NACL 20-0.9 MG/50ML-% IV SOLN
20.0000 mg | Freq: Once | INTRAVENOUS | Status: AC
  Administered 2019-10-26: 11:00:00 20 mg via INTRAVENOUS

## 2019-10-26 MED ORDER — SODIUM CHLORIDE 0.9% FLUSH
10.0000 mL | INTRAVENOUS | Status: DC | PRN
  Administered 2019-10-26: 10 mL
  Filled 2019-10-26: qty 10

## 2019-10-26 MED ORDER — SODIUM CHLORIDE 0.9 % IV SOLN
Freq: Once | INTRAVENOUS | Status: AC
  Filled 2019-10-26: qty 250

## 2019-10-26 MED ORDER — SODIUM CHLORIDE 0.9 % IV SOLN
200.0000 mg | Freq: Once | INTRAVENOUS | Status: AC
  Administered 2019-10-26: 12:00:00 200 mg via INTRAVENOUS
  Filled 2019-10-26: qty 8

## 2019-10-26 MED ORDER — HEPARIN SOD (PORK) LOCK FLUSH 100 UNIT/ML IV SOLN
500.0000 [IU] | Freq: Once | INTRAVENOUS | Status: AC | PRN
  Administered 2019-10-26: 12:00:00 500 [IU]
  Filled 2019-10-26: qty 5

## 2019-10-26 MED ORDER — FENTANYL 50 MCG/HR TD PT72: 1.0000 | MEDICATED_PATCH | TRANSDERMAL | 0 refills | Status: DC

## 2019-10-26 MED ORDER — MORPHINE SULFATE 15 MG PO TABS: 15.0000 mg | ORAL_TABLET | ORAL | 0 refills | Status: DC | PRN

## 2019-10-26 MED ORDER — DIPHENHYDRAMINE HCL 50 MG/ML IJ SOLN
25.0000 mg | Freq: Once | INTRAMUSCULAR | Status: AC
  Administered 2019-10-26: 25 mg via INTRAVENOUS

## 2019-10-26 MED ORDER — DRONABINOL 5 MG PO CAPS: 5.0000 mg | ORAL_CAPSULE | Freq: Two times a day (BID) | ORAL | 0 refills | Status: DC

## 2019-10-26 MED ORDER — DIPHENHYDRAMINE HCL 50 MG/ML IJ SOLN
INTRAMUSCULAR | Status: AC
  Filled 2019-10-26: qty 1

## 2019-10-26 MED ORDER — DENOSUMAB 120 MG/1.7ML ~~LOC~~ SOLN: 120.0000 mg | Freq: Once | SUBCUTANEOUS | Status: DC

## 2019-10-26 NOTE — Patient Instructions (Signed)
[facility] Discharge Instructions for Patients Receiving Chemotherapy  Today you received the following chemotherapy agents:  Keytruda.  To help prevent nausea and vomiting after your treatment, we encourage you to take your nausea medication as directed.   If you develop nausea and vomiting that is not controlled by your nausea medication, call the clinic.   BELOW ARE SYMPTOMS THAT SHOULD BE REPORTED IMMEDIATELY:  *FEVER GREATER THAN 100.5 F  *CHILLS WITH OR WITHOUT FEVER  NAUSEA AND VOMITING THAT IS NOT CONTROLLED WITH YOUR NAUSEA MEDICATION  *UNUSUAL SHORTNESS OF BREATH  *UNUSUAL BRUISING OR BLEEDING  TENDERNESS IN MOUTH AND THROAT WITH OR WITHOUT PRESENCE OF ULCERS  *URINARY PROBLEMS  *BOWEL PROBLEMS  UNUSUAL RASH Items with * indicate a potential emergency and should be followed up as soon as possible.  Feel free to call the clinic should you have any questions or concerns. The clinic phone number is [phone].  Please show the CHEMO ALERT CARD at check-in to the Emergency Department and triage nurse.    

## 2019-10-26 NOTE — Progress Notes (Signed)
Brief nutrition follow up completed with patient during infusion for metastatic lung cancer. He reports he continues to eat as he normally eats. Drinks a carnation instant breakfast every other day. Occasional constipation controlled by prune juice or laxative. No concerns or questions.  Nutrition Diagnosis: Unintended weight loss stable.  Interventions: Encouraged continued meals and snacks with adequate calories and protein. Carnation Breakfast as needed.  Monitoring, Evaluation, Goals: Patient will tolerate adequate calories and protein to maintain weight.  Next Visit: Wednesday, March 31, during infusion.

## 2019-10-26 NOTE — Progress Notes (Signed)
HEMATOLOGY/ONCOLOGY CLINIC NOTE  Date of Service: 10/26/2019  Patient Care Team: [name] as PCP - General (Family Medicine) Lorretta Harp, MD as PCP - Cardiology (Cardiology) Brunetta Genera, MD as Consulting Physician (Hematology) Margaretha Seeds, MD as Consulting Physician (Pulmonary Disease) [name], MD as Consulting Physician (Urology)  CHIEF COMPLAINTS/PURPOSE OF CONSULTATION:  F/u for continued management of metastatic Squamous cell lung cancer  HISTORY OF PRESENTING ILLNESS:    Kyle Foster is a wonderful 74 y.o. male who has been referred to [place] by Dr. Karie Kirks for evaluation and management of Lung Mass concerning for primary lung cancer.   he pt reports he has been having back pain on and off for about 3 months and was being worked up at the [facility] in [place] by his PCP . He reports it was being maanged as MSK pain and he was referred to PT but the symptoms got progressively worse. He presented to the ED on 10/14/18 with SOB and midsternal chest pain, neck pain, and back pain which had been constant for the last 2-3 weeks.   Of note prior to the patient's visit today, pt has had a CTA Chest completed on 10/14/18 with results revealing 5.6 cm left upper lobe lesion with chest wall and thoracic spine invasion, possible epidural extension of tumor. 2. 2.7 cm left hilar mass occluding the left lower lobe pulmonary artery branch and probably attenuating or occluding the inferior left pulmonary vein. 3. Left lower lobe pulmonary nodules possibly metastatic. 4. Negative for acute PE or thoracic aortic dissection. 5. Coronary and Aortic Atherosclerosis.  Most recent lab results (10/15/18) of CBC is as follows: all values are WNL except for RBC at 3.76, [name], [name], Glucose at 111.  He subsequently had an MRI of the T spine which showed Large cavitary mass arising in the superior aspect of the left hilum and extending into the  posteromedial aspect of the left upper lobe invading the left side of the T5 and T6 vertebra and extending into the spinal canal with a slight mass effect upon the spinal cord. There is a slight pathologic compression fracture of the T6 vertebral body. 2. Probable small metastasis in the T8 vertebral body. 3. Metastatic pulmonary nodules at the left lung base posterolaterally. 4. The mass destroys the posterior aspects of the left fifth and sixth ribs.  Patient has had a h/o localized bladder cancer s/p TURBT in 2009. 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   Interval History:   DMARIUS REEDER returns today for management and evaluation of his Squamous Cell Carcinoma Lung Cancer and maintenance Pembrolizumab. The patient's last visit with [place] was on 10/05/2019. The pt reports that he is doing well overall.  The pt reports that he had cataract surgery and denies any issues. Pt has had the first dose of the COVID19 vaccine and has the other scheduled for the beginning of March. Pt does have some occasional constipation, but is able to remedy this with prune juice or a laxative.  He has had some skin spots on his hands frozen off by his Dermatologist. He plans to visit them every 3 months.   Lab results today (10/26/19) of CBC w/diff and CMP is as follows: all values are WNL except for RBC at 3.70, Hgb at 12.2, HCT at 36.6, PLT at 144K, Lymphs Abs at 0.5K, Calcium at 8.7.  On review of systems, pt reports fatigue, body aches, healthy appetite, constipation and denies diarrhea, mouth sores and any other symptoms.    Past Medical History:  Diagnosis Date  . Allergy   . Bladder cancer ([place]) 10/2008  . CAD (coronary artery disease)   . 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 Depression   . GERD (gastroesophageal reflux disease)   . History of chickenpox   . History of  kidney stones   . Hyperlipidemia   . Hypertension   . Myocardial infarction ([place]) 06/2008  . Recurrent upper respiratory infection (URI)    09/01/11 saw PCP - Kathryne Eriksson , antibiotic  and prednisone   . Skin cancer    "cut & burned off arms, hands, face, neck" (06/14/2018)  . Squamous cell carcinoma of lung ([place]) dx'd 10/2018   chemo.xrt. immunotherapy    SURGICAL HISTORY: Past Surgical History:  Procedure Laterality Date  . CATARACT EXTRACTION W/ INTRAOCULAR LENS IMPLANT Right 12/2007  . CORONARY ANGIOPLASTY WITH STENT PLACEMENT  06/2008   "3 stents" (06/14/2018)  . CYSTOSCOPY W/ RETROGRADES  09/22/2011   Procedure: CYSTOSCOPY WITH RETROGRADE PYELOGRAM;  Surgeon: Bernestine Amass, MD;  Location: [facility];  Service: Urology;  Laterality: Left;  Cystoscopy left Retrograde Pyelogram      (c-arm)   . CYSTOSCOPY WITH BIOPSY  09/22/2011   Procedure: CYSTOSCOPY WITH BIOPSY;  Surgeon: Bernestine Amass, MD;  Location: [facility];  Service: Urology;  Laterality: N/A;   Biopsy  . EXCISIONAL HEMORRHOIDECTOMY  ~ 2006  . EYE SURGERY Left 04/2011   "reconstruction; gold weight in eye lid " (06/14/2018)  . FOOT NEUROMA SURGERY Left   . INGUINAL HERNIA REPAIR Right 02/2018  . IR IMAGING GUIDED PORT INSERTION  11/23/2018  . NM MYOCAR PERF WALL MOTION  07/11/2008   MILD ISCHEMIA IN THE BASL INFERIOR, MID INFERIOR & APICAL INFERIOR REGIONS  . 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 SKIN CANCER EXCISION     "arms, hands, face, neck" (06/14/2018)  . TRANSURETHRAL RESECTION OF BLADDER TUMOR WITH GYRUS (TURBT-GYRUS)  2010    SOCIAL HISTORY: Social History   Socioeconomic History  . Marital status: Married    Spouse name: Not on file  . Number of children: Not on file  . Years of education: Not on file  . Highest education level: Not on file  Occupational History  . Not on file  Tobacco Use  . Smoking status: Current Every Day Smoker    Packs/day:  0.50    Years: 52.00    Pack years: 26.00    Types: Cigarettes    Start date: 4  . Smokeless tobacco: Never Used  . Tobacco comment: 03/21/19- smoking 10 cigs a day   Substance and Sexual Activity  . Alcohol use: Not Currently  . Drug use: Not Currently  . Sexual activity: Not on file  Other Topics Concern  . Not on file  Social History Narrative  . Not on file   Social Determinants of Health   Financial Resource Strain:   . Difficulty of Paying Living Expenses: Not on file  Food Insecurity:   . Worried About Charity fundraiser in the Last Year: Not on file  . Ran Out of Food in the Last Year: Not on file  Transportation Needs:   . Lack of Transportation (Medical):  Not on file  . Lack of Transportation (Non-Medical): Not on file  Physical Activity:   . Days of Exercise per Week: Not on file  . Minutes of Exercise per Session: Not on file  Stress:   . Feeling of Stress : Not on file  Social Connections:   . Frequency of Communication with Friends and Family: Not on file  . Frequency of Social Gatherings with Friends and Family: Not on file  . Attends Religious Services: Not on file  . Active Member of Clubs or Organizations: Not on file  . Attends Archivist Meetings: Not on file  . Marital Status: Not on file  Intimate Partner Violence:   . Fear of Current or Ex-Partner: Not on file  . Emotionally Abused: Not on file  . Physically Abused: Not on file  . Sexually Abused: Not on file    FAMILY HISTORY: Family History  Problem Relation Age of Onset  . Heart attack Mother 29  . Cancer Mother        Lung  . Diabetes Mother   . Heart disease Mother   . Hypertension Mother   . Stroke Father 97  . Cancer Father        Lung  . Brain cancer Brother   . Cancer Sister        Melanoma of great Toe  . Hyperlipidemia Sister     ALLERGIES:  is allergic to codeine.  MEDICATIONS:  Current Outpatient Medications  Medication Sig Dispense Refill  .  albuterol (VENTOLIN HFA) 108 (90 Base) MCG/ACT inhaler Inhale 1-2 puffs into the lungs every 6 (six) hours as needed for wheezing or shortness of breath. 1 Inhaler 2  . atorvastatin (LIPITOR) 80 MG tablet Take 1 tablet (80 mg total) by mouth daily at 6 PM. 90 tablet 3  . calcium carbonate (TUMS - DOSED IN MG ELEMENTAL CALCIUM) 500 MG chewable tablet Chew 1 tablet (200 mg of elemental calcium total) by mouth 3 (three) times daily with meals. 30 tablet 3  . Cholecalciferol (VITAMIN D) 2000 UNITS tablet Take 2,000 Units by mouth 2 (two) times daily.    . clopidogrel (PLAVIX) 75 MG tablet Take 1 tablet (75 mg total) by mouth daily. 90 tablet 3  . DENTA 5000 PLUS 1.1 % CREA dental cream See admin instructions.    Marland Kitchen dronabinol (MARINOL) 5 MG capsule TAKE 1 CAPSULE (5 MG TOTAL) BY MOUTH 2 TIMES DAILY BEFORE A MEAL. 60 capsule 0  . DULoxetine (CYMBALTA) 30 MG capsule TAKE 1 CAPSULE BY MOUTH EVERY DAY 90 capsule 2  . fentaNYL (DURAGESIC) 50 MCG/HR Place 1 patch onto the skin every 3 (three) days. 10 patch 0  . Fluticasone-Umeclidin-Vilant (TRELEGY ELLIPTA) 100-62.5-25 MCG/INH AEPB Inhale 1 puff into the lungs daily. 1 each 6  . Fluticasone-Umeclidin-Vilant (TRELEGY ELLIPTA) 100-62.5-25 MCG/INH AEPB Inhale 1 puff into the lungs daily. 1 each 0  . Hypromellose (ARTIFICIAL TEARS OP) Place 1 drop into both eyes at bedtime.     . lidocaine-prilocaine (EMLA) cream Apply 1 application topically as needed. 30 g 1  . LORazepam (ATIVAN) 0.5 MG tablet Take 1 tablet (0.5 mg total) by mouth every 6 (six) hours as needed (Nausea or vomiting). 30 tablet 0  . magnesium oxide (MAG-OX) 400 MG tablet TAKE 1 TABLET BY MOUTH EVERY DAY 90 tablet 0  . metoprolol tartrate (LOPRESSOR) 25 MG tablet Take 1 tablet (25 mg total) by mouth 2 (two) times daily. 180 tablet 3  . morphine (MSIR)  15 MG tablet Take 1-2 tablets (15-30 mg total) by mouth every 4 (four) hours as needed for moderate pain or severe pain. 120 tablet 0  . ondansetron  (ZOFRAN) 8 MG tablet TAKE 1 TABLET (8 MG TOTAL) BY MOUTH EVERY 8 (EIGHT) HOURS AS NEEDED FOR NAUSEA OR VOMITING. 30 tablet 1  . pantoprazole (PROTONIX) 40 MG tablet Take 1 tablet (40 mg total) by mouth daily. 90 tablet 3  . umeclidinium bromide (INCRUSE ELLIPTA) 62.5 MCG/INH AEPB Inhale 1 puff into the lungs daily. 1 each 3   No current facility-administered medications for this visit.   Facility-Administered Medications Ordered in Other Visits  Medication Dose Route Frequency Provider Last Rate Last Admin  . diphenhydrAMINE (BENADRYL) injection 25 mg  25 mg Intravenous Once Brunetta Genera, MD      . famotidine (PEPCID) IVPB 20 mg premix  20 mg Intravenous Once Brunetta Genera, MD      . heparin lock flush 100 unit/mL  500 Units Intracatheter Once PRN Brunetta Genera, MD      . pembrolizumab [facility]) 200 mg in sodium chloride 0.9 % 50 mL chemo infusion  200 mg Intravenous Once Brunetta Genera, MD      . sodium chloride flush (NS) 0.9 % injection 10 mL  10 mL Intracatheter PRN Brunetta Genera, MD        REVIEW OF SYSTEMS:   A 10+ POINT REVIEW OF SYSTEMS WAS OBTAINED including neurology, dermatology, psychiatry, cardiac, respiratory, lymph, extremities, GI, GU, Musculoskeletal, constitutional, breasts, reproductive, HEENT.  All pertinent positives are noted in the HPI.  All others are negative.   PHYSICAL EXAMINATION: ECOG PERFORMANCE STATUS: 2 - Symptomatic, <50% confined to bed  Vitals:   10/26/19 0952  BP: (!) 94/57  Pulse: 67  Resp: 18  Temp: 99.1 F (37.3 C)  SpO2: 99%   Filed Weights   10/26/19 0952  Weight: 125 lb 8 oz (56.9 kg)   .Body mass index is 18.53 kg/m.   GENERAL:alert, in no acute distress and comfortable SKIN: no acute rashes, no significant lesions EYES: conjunctiva are pink and non-injected, sclera anicteric OROPHARYNX: MMM, no exudates, no oropharyngeal erythema or ulceration NECK: supple, no JVD LYMPH:  no palpable  lymphadenopathy in the cervical, axillary or inguinal regions LUNGS: clear to auscultation b/l with normal respiratory effort HEART: regular rate & rhythm ABDOMEN:  normoactive bowel sounds , non tender, not distended. No palpable hepatosplenomegaly.  Extremity: no pedal edema PSYCH: alert & oriented x 3 with fluent speech NEURO: no focal motor/sensory deficits  LABORATORY DATA:  I have reviewed the data as listed  . CBC Latest Ref Rng & Units 10/26/2019 10/05/2019 09/14/2019  WBC 4.0 - 10.5 K/uL 6.1 7.6 7.6  Hemoglobin 13.0 - 17.0 g/dL 12.2(L) 12.7(L) 12.1(L)  Hematocrit 39.0 - 52.0 % 36.6(L) 38.0(L) 36.0(L)  Platelets 150 - 400 K/uL 144(L) 130(L) 133(L)    . CMP Latest Ref Rng & Units 10/26/2019 10/05/2019 09/14/2019  Glucose 70 - 99 mg/dL 91 89 89  BUN 8 - 23 mg/dL 18 20 22   Creatinine 0.61 - 1.24 mg/dL 0.95 0.96 0.84  Sodium 135 - 145 mmol/L 140 140 140  Potassium 3.5 - 5.1 mmol/L 4.2 4.2 4.4  Chloride 98 - 111 mmol/L 106 104 106  CO2 22 - 32 mmol/L 27 29 26   Calcium 8.9 - 10.3 mg/dL 8.7(L) 9.2 8.8(L)  Total Protein 6.5 - 8.1 g/dL 6.5 6.4(L) 6.5  Total Bilirubin 0.3 - 1.2 mg/dL 0.5 0.6  0.5  Alkaline Phos 38 - 126 U/L 51 51 54  AST 15 - 41 U/L 17 20 19   ALT 0 - 44 U/L 17 18 15         RADIOGRAPHIC STUDIES: I have personally reviewed the radiological images as listed and agreed with the findings in the report. CT Chest Wo Contrast  Result Date: 09/28/2019 CLINICAL DATA:  Squamous cell carcinoma of the lung with metastatic disease to the spine. Immunotherapy in progress. Status post XRT. EXAM: CT CHEST WITHOUT CONTRAST TECHNIQUE: Multidetector CT imaging of the chest was performed following the standard protocol without IV contrast. COMPARISON:  05/30/2019 FINDINGS: Cardiovascular: The heart size appears within normal limits. No pericardial effusion identified. Aortic atherosclerosis. No aneurysm. Three vessel coronary artery atherosclerotic calcifications. Mediastinum/Nodes:  Normal appearance of the thyroid gland. The trachea is patent and appears midline. Normal appearance of the esophagus. No adenopathy. Lungs/Pleura: Paraseptal and centrilobular emphysema. Large cystic and solid lesion invading the vertebral column and posterior left ribs is again noted. On today's study this measures 6.6 by 3.8 by 3.9 cm (volume = 51 cm^3), image 39/7. Previously this measured 6.3 by 4.3 by 3.6 cm (volume = 51 cm^3). There is surrounding paramediastinal radiation change within the superior segment of left lower lobe. Within the posterior apex of the left lung there is a 2.5 x 1.9 cm triangular-shaped subpleural density, image 19/7. Unchanged from previous exam. This may be related to post treatment change no new lung nodules identified. Upper Abdomen: Stone within upper pole of right kidney is again noted. Large left kidney cyst is again identified. No acute findings. Musculoskeletal: Lytic lesions involving T5 and T6 vertebral bodies as described above. There is involvement of the corresponding left transverse processes and pedicles of these vertebra. Erosive changes are also noted involving the left fifth and sixth ribs, similar to previous exam. IMPRESSION: 1. Stable size of cystic and solid lesion invading the vertebral column and posterior left ribs. 2. Stable appearance of paramediastinal radiation change within the superior segment of left lower lobe. 3. Stable appearance of triangular-shaped subpleural density within the posterior apex of left lung which may be related to post treatment change. 4. Aortic atherosclerosis and 3 vessel coronary artery atherosclerotic calcifications. Aortic Atherosclerosis (ICD10-I70.0) and Emphysema (ICD10-J43.9). Electronically Signed   By: Kerby Moors M.D.   On: 09/28/2019 14:20     ASSESSMENT & PLAN:   74 y.o. male with  1. 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 10/19/18 Lung biopsy revealed Squamous Cell carcinoma S/p Palliative RT to LUL lung mass with 30Gy in 10 fractions between 10/22/18 and 11/03/18, due to impending cord compression  10/19/18 PDL-1 status report found 30% PDL-1 tumor proportion  11/06/18 MRI Brain revealed No intracranial metastatic disease identified. 2. Mild chronic microvascular ischemic changes and volume loss of the brain.  11/10/18 PET/CT revealed Locally advanced left lung mass, as detailed previously. 2. Left perihilar direct extension versus nodal metastasis. 3. No findings of hypermetabolic distant metastasis. 4. No findings of extrathoracic hypermetabolic metastasis.  02/09/19 CT Chest and Abdomen revealed "Today's study demonstrates a positive response to therapy with slight regression of the primary lesion in the superior segment of the left lower lobe which again demonstrates direct invasion of the adjacent posteromedial left chest wall and vertebral column, as detailed above. 2. However, there is a new hypovascular lesion in  segment 7 of the liver. This is nonspecific and too small to characterize, but given the development compared to prior studies, this is concerning for potential metastasis. This could be definitively characterized with MRI of the abdomen with and without IV gadolinium if clinically appropriate. 3. 5 mm nonobstructive calculus in the right renal collecting system. 4. Aortic atherosclerosis, in addition to 3 vessel coronary artery disease. Assessment for potential risk factor modification, dietary therapy or pharmacologic therapy may be warranted, if clinically Indicated. 5. There are calcifications of the aortic valve. Echocardiographic correlation for evaluation of potential valvular dysfunction may be warranted if clinically indicated. 6. Additional incidental findings, as above." Discussed that I suspect that the liver finding is likely a cyst, as  progression through treatment would be unexpected. Nevertheless, will monitor this finding over time with future scans.  S/p 5 cycles of Carboplatin AUC of 5, 171m/m2 Taxol, and Pembrolizumab with G-CSF support completed on 02/16/19.  03/08/19 ECHO which revealed LV EF of 55-60%  05/30/2019 CT Chest w/o contrast ([id number] which revealed "1. Stable post treatment related changes of prior radiation therapy in the left lung. Chronic destructive changes in T5 and T6 vertebrae as well as the posterior aspects of the left fifth and sixth ribs, similar to the prior study. 2. Diffuse bronchial wall thickening with mild to moderate centrilobular and paraseptal emphysema. 3. Aortic atherosclerosis, in addition to three-vessel coronary artery disease. Please note that although the presence of coronary artery calcium documents the presence of coronary artery disease, the severity of this disease and any potential stenosis cannot be assessed on this non-gated CT examination. Assessment for potential risk factor modification, dietary therapy or pharmacologic therapy may be warranted, if clinically indicated. 4. There are calcifications of the aortic valve and mitral valve/annulus. Echocardiographic correlation for evaluation of potential valvular dysfunction may be warranted if clinically indicated. 5. 6 mm nonobstructive calculus in the upper pole collecting system of the right kidney. 6. Additional incidental findings, as above."  09/28/2019 CT Chest ([id number] revealed stable appearance of main mass, some radiation changes in the lung, no new lung nodules, no new bone changes  2. 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 Smoker 4. 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  Has regularly followed up with ENT Dr. JFenton Malling6. Cancer related pain   PLAN: -Discussed pt labwork today, 10/26/19; blood counts and chemistries are steady -No lab or clinical or radiographic evidence of Squamous Cell Carcinoma Lung Cancer progression at this time -The pt has no prohibitive toxicities from continuing maintenance Pembrolizumab at this time. -Recommend pt f/u for the second dose of the COVID19 vaccine as scheduled.  -Will get repeat scans every 3-4 months unless new symptoms -Continue Xgeva every 6 weeks with every other treatment -Continue to f/u with Dermatology for Squamous Cell Carcinoma  -Refill Fentanyl Patch, Morphine, Marinol  -Will see back in 6 weeks with labs   FOLLOW UP: -Please schedule next 4 doses of pembrolizumab with labs -MD visit with every other treatment.  Next MD visit in 6 weeks may cancel MD appointment with his next cycle of treatment. -Continue Xgeva every 6 weeks with every other treatment.   The total time spent in the appt was 15 minutes and more than 50% was on counseling and direct patient cares.  All of the patient's questions were answered with apparent satisfaction. The patient knows to call the clinic with any problems, questions or concerns. Sullivan Lone MD [place] AAHIVMS [facility] Hematology/Oncology Physician [facility]  (Office):       [phone] (Work cell):  [phone] (Fax):           [phone]  10/26/2019 10:29 AM  I, Yevette Edwards, am acting as a scribe for Dr. Sullivan Lone.   .I have reviewed the above documentation for accuracy and completeness, and I agree with the above. Brunetta Genera MD

## 2019-11-16 ENCOUNTER — Inpatient Hospital Stay: Payer: No Typology Code available for payment source

## 2019-11-16 ENCOUNTER — Inpatient Hospital Stay: Payer: No Typology Code available for payment source | Attending: Hematology

## 2019-11-16 ENCOUNTER — Telehealth: Payer: Self-pay | Admitting: *Deleted

## 2019-11-16 ENCOUNTER — Other Ambulatory Visit: Payer: Self-pay

## 2019-11-16 VITALS — BP 101/54 | HR 62 | Temp 98.9°F | Resp 16 | Wt 124.5 lb

## 2019-11-16 DIAGNOSIS — Z5112 Encounter for antineoplastic immunotherapy: Secondary | ICD-10-CM

## 2019-11-16 DIAGNOSIS — Z7189 Other specified counseling: Secondary | ICD-10-CM

## 2019-11-16 DIAGNOSIS — C7951 Secondary malignant neoplasm of bone: Secondary | ICD-10-CM | POA: Insufficient documentation

## 2019-11-16 DIAGNOSIS — C3412 Malignant neoplasm of upper lobe, left bronchus or lung: Secondary | ICD-10-CM | POA: Diagnosis not present

## 2019-11-16 DIAGNOSIS — Z0189 Encounter for other specified special examinations: Secondary | ICD-10-CM

## 2019-11-16 DIAGNOSIS — C3492 Malignant neoplasm of unspecified part of left bronchus or lung: Secondary | ICD-10-CM

## 2019-11-16 DIAGNOSIS — F17210 Nicotine dependence, cigarettes, uncomplicated: Secondary | ICD-10-CM | POA: Insufficient documentation

## 2019-11-16 DIAGNOSIS — Z79899 Other long term (current) drug therapy: Secondary | ICD-10-CM | POA: Insufficient documentation

## 2019-11-16 LAB — CMP (CANCER CENTER ONLY)
ALT: 12 U/L (ref 0–44)
AST: 19 U/L (ref 15–41)
Albumin: 3.6 g/dL (ref 3.5–5.0)
Alkaline Phosphatase: 55 U/L (ref 38–126)
Anion gap: 7 (ref 5–15)
BUN: 21 mg/dL (ref 8–23)
CO2: 29 mmol/L (ref 22–32)
Calcium: 9.3 mg/dL (ref 8.9–10.3)
Chloride: 106 mmol/L (ref 98–111)
Creatinine: 0.91 mg/dL (ref 0.61–1.24)
GFR, Est AFR Am: >60 mL/min (ref >60)
GFR, Estimated: >60 mL/min (ref >60)
Glucose, Bld: 93 mg/dL (ref 70–99)
Potassium: 4.3 mmol/L (ref 3.5–5.1)
Sodium: 142 mmol/L (ref 135–145)
Total Bilirubin: 0.3 mg/dL (ref 0.3–1.2)
Total Protein: 6 g/dL — ABNORMAL LOW (ref 6.5–8.1)

## 2019-11-16 LAB — CBC WITH DIFFERENTIAL/PLATELET
Abs Immature Granulocytes: 0.03 10*3/uL (ref 0.00–0.07)
Basophils Absolute: 0 10*3/uL (ref 0.0–0.1)
Basophils Relative: 0 %
Eosinophils Absolute: 0.2 10*3/uL (ref 0.0–0.5)
Eosinophils Relative: 3 %
HCT: 37.1 % — ABNORMAL LOW (ref 39.0–52.0)
Hemoglobin: 12.1 g/dL — ABNORMAL LOW (ref 13.0–17.0)
Immature Granulocytes: 0 %
Lymphocytes Relative: 10 %
Lymphs Abs: 0.7 10*3/uL (ref 0.7–4.0)
MCH: 32.2 pg (ref 26.0–34.0)
MCHC: 32.6 g/dL (ref 30.0–36.0)
MCV: 98.7 fL (ref 80.0–100.0)
Monocytes Absolute: 0.7 10*3/uL (ref 0.1–1.0)
Monocytes Relative: 10 %
Neutro Abs: 5.1 10*3/uL (ref 1.7–7.7)
Neutrophils Relative %: 77 %
Platelets: 135 10*3/uL — ABNORMAL LOW (ref 150–400)
RBC: 3.76 MIL/uL — ABNORMAL LOW (ref 4.22–5.81)
RDW: 13 % (ref 11.5–15.5)
WBC: 6.7 10*3/uL (ref 4.0–10.5)
nRBC: 0 % (ref 0.0–0.2)

## 2019-11-16 LAB — TSH: TSH: 1.656 u[IU]/mL (ref 0.320–4.118)

## 2019-11-16 MED ORDER — SODIUM CHLORIDE 0.9 % IV SOLN
Freq: Once | INTRAVENOUS | Status: AC
  Filled 2019-11-16: qty 250

## 2019-11-16 MED ORDER — SODIUM CHLORIDE 0.9% FLUSH
10.0000 mL | INTRAVENOUS | Status: DC | PRN
  Administered 2019-11-16: 10 mL
  Filled 2019-11-16: qty 10

## 2019-11-16 MED ORDER — SODIUM CHLORIDE 0.9 % IV SOLN
200.0000 mg | Freq: Once | INTRAVENOUS | Status: AC
  Administered 2019-11-16: 200 mg via INTRAVENOUS
  Filled 2019-11-16: qty 8

## 2019-11-16 MED ORDER — FAMOTIDINE IN NACL 20-0.9 MG/50ML-% IV SOLN
20.0000 mg | Freq: Once | INTRAVENOUS | Status: AC
  Administered 2019-11-16: 20 mg via INTRAVENOUS

## 2019-11-16 MED ORDER — FAMOTIDINE IN NACL 20-0.9 MG/50ML-% IV SOLN
INTRAVENOUS | Status: AC
  Filled 2019-11-16: qty 50

## 2019-11-16 MED ORDER — HEPARIN SOD (PORK) LOCK FLUSH 100 UNIT/ML IV SOLN
500.0000 [IU] | Freq: Once | INTRAVENOUS | Status: AC | PRN
  Administered 2019-11-16: 500 [IU]
  Filled 2019-11-16: qty 5

## 2019-11-16 MED ORDER — DIPHENHYDRAMINE HCL 50 MG/ML IJ SOLN
INTRAMUSCULAR | Status: AC
  Filled 2019-11-16: qty 1

## 2019-11-16 MED ORDER — DIPHENHYDRAMINE HCL 50 MG/ML IJ SOLN
25.0000 mg | Freq: Once | INTRAMUSCULAR | Status: AC
  Administered 2019-11-16: 25 mg via INTRAVENOUS

## 2019-11-16 NOTE — Telephone Encounter (Signed)
[employer] Forms in basket contained Bebe Liter letters for patient son Benecio Kluger; claim [id number].  Bebe Liter reports unable to match claim.   Connected with Bebe Liter, "Claim immediately found has been approved".

## 2019-11-16 NOTE — Patient Instructions (Signed)
[facility] Discharge Instructions for Patients Receiving Chemotherapy  Today you received the following chemotherapy agents: pembrolizumab.  To help prevent nausea and vomiting after your treatment, we encourage you to take your nausea medication as directed.   If you develop nausea and vomiting that is not controlled by your nausea medication, call the clinic.   BELOW ARE SYMPTOMS THAT SHOULD BE REPORTED IMMEDIATELY:  *FEVER GREATER THAN 100.5 F  *CHILLS WITH OR WITHOUT FEVER  NAUSEA AND VOMITING THAT IS NOT CONTROLLED WITH YOUR NAUSEA MEDICATION  *UNUSUAL SHORTNESS OF BREATH  *UNUSUAL BRUISING OR BLEEDING  TENDERNESS IN MOUTH AND THROAT WITH OR WITHOUT PRESENCE OF ULCERS  *URINARY PROBLEMS  *BOWEL PROBLEMS  UNUSUAL RASH Items with * indicate a potential emergency and should be followed up as soon as possible.  Feel free to call the clinic should you have any questions or concerns. The clinic phone number is [phone].  Please show the CHEMO ALERT CARD at check-in to the Emergency Department and triage nurse.   

## 2019-11-23 ENCOUNTER — Other Ambulatory Visit: Payer: Self-pay | Admitting: *Deleted

## 2019-11-23 DIAGNOSIS — C3492 Malignant neoplasm of unspecified part of left bronchus or lung: Secondary | ICD-10-CM

## 2019-11-23 MED ORDER — ONDANSETRON HCL 8 MG PO TABS: 8.0000 mg | ORAL_TABLET | Freq: Three times a day (TID) | ORAL | 1 refills | Status: DC | PRN

## 2019-11-23 MED ORDER — FENTANYL 50 MCG/HR TD PT72: 1.0000 | MEDICATED_PATCH | TRANSDERMAL | 0 refills | Status: DC

## 2019-11-23 MED ORDER — MORPHINE SULFATE 15 MG PO TABS: 15.0000 mg | ORAL_TABLET | ORAL | 0 refills | Status: DC | PRN

## 2019-11-23 NOTE — Telephone Encounter (Signed)
Patient wife called - requested refills for Fentanyl 50 mcg, MS IR 15 mg, and Zofran.

## 2019-12-01 NOTE — Progress Notes (Signed)
Pharmacist Chemotherapy Monitoring - Follow Up Assessment    I verify that I have reviewed each item in the below checklist:  . Regimen for the patient is scheduled for the appropriate day and plan matches scheduled date. Marland Kitchen Appropriate non-routine labs are ordered dependent on drug ordered. . If applicable, additional medications reviewed and ordered per protocol based on lifetime cumulative doses and/or treatment regimen.   Plan for follow-up and/or issues identified: No . I-vent associated with next due treatment: No . MD and/or nursing notified: No  Geordan Xu K 12/01/2019 11:24 AM

## 2019-12-06 NOTE — Progress Notes (Signed)
HEMATOLOGY/ONCOLOGY CLINIC NOTE  Date of Service: 12/07/2019  Patient Care Team: [name] as PCP - General (Family Medicine) Lorretta Harp, MD as PCP - Cardiology (Cardiology) Brunetta Genera, MD as Consulting Physician (Hematology) Margaretha Seeds, MD as Consulting Physician (Pulmonary Disease) [name], MD as Consulting Physician (Urology)  CHIEF COMPLAINTS/PURPOSE OF CONSULTATION:  F/u for continued management of metastatic Squamous cell lung cancer  HISTORY OF PRESENTING ILLNESS:    Kyle Foster is a wonderful 74 y.o. male who has been referred to [place] by Dr. Karie Kirks for evaluation and management of Lung Mass concerning for primary lung cancer.   he pt reports he has been having back pain on and off for about 3 months and was being worked up at the [facility] in [place] by his PCP . He reports it was being maanged as MSK pain and he was referred to PT but the symptoms got progressively worse. He presented to the ED on 10/14/18 with SOB and midsternal chest pain, neck pain, and back pain which had been constant for the last 2-3 weeks.   Of note prior to the patient's visit today, pt has had a CTA Chest completed on 10/14/18 with results revealing 5.6 cm left upper lobe lesion with chest wall and thoracic spine invasion, possible epidural extension of tumor. 2. 2.7 cm left hilar mass occluding the left lower lobe pulmonary artery branch and probably attenuating or occluding the inferior left pulmonary vein. 3. Left lower lobe pulmonary nodules possibly metastatic. 4. Negative for acute PE or thoracic aortic dissection. 5. Coronary and Aortic Atherosclerosis.  Most recent lab results (10/15/18) of CBC is as follows: all values are WNL except for RBC at 3.76, [name], [name], Glucose at 111.  He subsequently had an MRI of the T spine which showed Large cavitary mass arising in the superior aspect of the left hilum and extending into the  posteromedial aspect of the left upper lobe invading the left side of the T5 and T6 vertebra and extending into the spinal canal with a slight mass effect upon the spinal cord. There is a slight pathologic compression fracture of the T6 vertebral body. 2. Probable small metastasis in the T8 vertebral body. 3. Metastatic pulmonary nodules at the left lung base posterolaterally. 4. The mass destroys the posterior aspects of the left fifth and sixth ribs.  Patient has had a h/o localized bladder cancer s/p TURBT in 2009. 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   Interval History:   DAQUAN CRAPPS returns today for management and evaluation of his Squamous Cell Carcinoma Lung Cancer and maintenance Pembrolizumab. The patient's last visit with [place] was on 10/26/19. The pt reports that he is doing well overall.  The pt reports he is good. He had more energy. Pt has gotten 1st dose of COVID19 vaccine and is scheduled for 2nd on April 15th.   Lab results today (12/07/19) of CBC w/diff and CMP is as follows: all values are WNL except for RBC at 3.86, Hemoglobi nat 12.8, HCT at 38.6, Platelets at 148K, Lymph's Abs at 0.6K. 11/16/19 of TSH at 1.656  On review of systems, pt reports regular bowl movements and denies back pain, diarrhea, abdominal pain and any other symptoms.     Past Medical History:  Diagnosis Date  . Allergy   . Bladder cancer ([place]) 10/2008  . CAD (coronary  artery disease)   . Cancer of parotid gland ([place]) 12/2009   "squamous cell cancer attached to it; took the gland out"  . Depression   . GERD (gastroesophageal reflux disease)   . History of chickenpox   . History of kidney stones   . Hyperlipidemia   . Hypertension   . Myocardial infarction ([place]) 06/2008  . Recurrent upper respiratory infection (URI)    09/01/11 saw PCP - Kathryne Eriksson , antibiotic  and prednisone   . Skin cancer    "cut & burned off arms,  hands, face, neck" (06/14/2018)  . Squamous cell carcinoma of lung ([place]) dx'd 10/2018   chemo.xrt. immunotherapy    SURGICAL HISTORY: Past Surgical History:  Procedure Laterality Date  . CATARACT EXTRACTION W/ INTRAOCULAR LENS IMPLANT Right 12/2007  . CORONARY ANGIOPLASTY WITH STENT PLACEMENT  06/2008   "3 stents" (06/14/2018)  . CYSTOSCOPY W/ RETROGRADES  09/22/2011   Procedure: CYSTOSCOPY WITH RETROGRADE PYELOGRAM;  Surgeon: Bernestine Amass, MD;  Location: [facility];  Service: Urology;  Laterality: Left;  Cystoscopy left Retrograde Pyelogram      (c-arm)   . CYSTOSCOPY WITH BIOPSY  09/22/2011   Procedure: CYSTOSCOPY WITH BIOPSY;  Surgeon: Bernestine Amass, MD;  Location: [facility];  Service: Urology;  Laterality: N/A;   Biopsy  . EXCISIONAL HEMORRHOIDECTOMY  ~ 2006  . EYE SURGERY Left 04/2011   "reconstruction; gold weight in eye lid " (06/14/2018)  . FOOT NEUROMA SURGERY Left   . INGUINAL HERNIA REPAIR Right 02/2018  . IR IMAGING GUIDED PORT INSERTION  11/23/2018  . NM MYOCAR PERF WALL MOTION  07/11/2008   MILD ISCHEMIA IN THE BASL INFERIOR, MID INFERIOR & APICAL INFERIOR REGIONS  . 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 SKIN CANCER EXCISION     "arms, hands, face, neck" (06/14/2018)  . TRANSURETHRAL RESECTION OF BLADDER TUMOR WITH GYRUS (TURBT-GYRUS)  2010    SOCIAL HISTORY: Social History   Socioeconomic History  . Marital status: Married    Spouse name: Not on file  . Number of children: Not on file  . Years of education: Not on file  . Highest education level: Not on file  Occupational History  . Not on file  Tobacco Use  . Smoking status: Current Every Day Smoker    Packs/day: 0.50    Years: 52.00    Pack years: 26.00    Types: Cigarettes    Start date: 40  . Smokeless tobacco: Never Used  . Tobacco comment: 03/21/19- smoking 10 cigs a day   Substance and Sexual Activity  . Alcohol use: Not Currently  . Drug use:  Not Currently  . Sexual activity: Not on file  Other Topics Concern  . Not on file  Social History Narrative  . Not on file   Social Determinants of Health   Financial Resource Strain:   . Difficulty of Paying Living Expenses:   Food Insecurity:   . Worried About Charity fundraiser in the Last Year:   . Arboriculturist in the Last Year:   Transportation Needs:   . Film/video editor (Medical):   Marland Kitchen Lack of Transportation (Non-Medical):   Physical Activity:   . Days of Exercise per Week:   . Minutes of Exercise per Session:   Stress:   . Feeling of Stress :   Social Connections:   . Frequency of Communication with Friends and  Family:   . Frequency of Social Gatherings with Friends and Family:   . Attends Religious Services:   . Active Member of Clubs or Organizations:   . Attends Archivist Meetings:   Marland Kitchen Marital Status:   Intimate Partner Violence:   . Fear of Current or Ex-Partner:   . Emotionally Abused:   Marland Kitchen Physically Abused:   . Sexually Abused:     FAMILY HISTORY: Family History  Problem Relation Age of Onset  . Heart attack Mother 75  . Cancer Mother        Lung  . Diabetes Mother   . Heart disease Mother   . Hypertension Mother   . Stroke Father 55  . Cancer Father        Lung  . Brain cancer Brother   . Cancer Sister        Melanoma of great Toe  . Hyperlipidemia Sister     ALLERGIES:  is allergic to codeine.  MEDICATIONS:  Current Outpatient Medications  Medication Sig Dispense Refill  . albuterol (VENTOLIN HFA) 108 (90 Base) MCG/ACT inhaler Inhale 1-2 puffs into the lungs every 6 (six) hours as needed for wheezing or shortness of breath. 1 Inhaler 2  . atorvastatin (LIPITOR) 80 MG tablet Take 1 tablet (80 mg total) by mouth daily at 6 PM. 90 tablet 3  . calcium carbonate (TUMS - DOSED IN MG ELEMENTAL CALCIUM) 500 MG chewable tablet Chew 1 tablet (200 mg of elemental calcium total) by mouth 3 (three) times daily with meals. 30 tablet  3  . Cholecalciferol (VITAMIN D) 2000 UNITS tablet Take 2,000 Units by mouth 2 (two) times daily.    . clopidogrel (PLAVIX) 75 MG tablet Take 1 tablet (75 mg total) by mouth daily. 90 tablet 3  . DENTA 5000 PLUS 1.1 % CREA dental cream See admin instructions.    Marland Kitchen dronabinol (MARINOL) 5 MG capsule Take 1 capsule (5 mg total) by mouth 2 (two) times daily before lunch and supper. 60 capsule 0  . DULoxetine (CYMBALTA) 30 MG capsule TAKE 1 CAPSULE BY MOUTH EVERY DAY 90 capsule 2  . fentaNYL (DURAGESIC) 50 MCG/HR Place 1 patch onto the skin every 3 (three) days. 10 patch 0  . Fluticasone-Umeclidin-Vilant (TRELEGY ELLIPTA) 100-62.5-25 MCG/INH AEPB Inhale 1 puff into the lungs daily. 1 each 6  . Fluticasone-Umeclidin-Vilant (TRELEGY ELLIPTA) 100-62.5-25 MCG/INH AEPB Inhale 1 puff into the lungs daily. 1 each 0  . Hypromellose (ARTIFICIAL TEARS OP) Place 1 drop into both eyes at bedtime.     . lidocaine-prilocaine (EMLA) cream Apply 1 application topically as needed. 30 g 1  . LORazepam (ATIVAN) 0.5 MG tablet Take 1 tablet (0.5 mg total) by mouth every 6 (six) hours as needed (Nausea or vomiting). 30 tablet 0  . magnesium oxide (MAG-OX) 400 MG tablet TAKE 1 TABLET BY MOUTH EVERY DAY 90 tablet 0  . metoprolol tartrate (LOPRESSOR) 25 MG tablet Take 1 tablet (25 mg total) by mouth 2 (two) times daily. 180 tablet 3  . morphine (MSIR) 15 MG tablet Take 1-2 tablets (15-30 mg total) by mouth every 4 (four) hours as needed for moderate pain or severe pain. 120 tablet 0  . ondansetron (ZOFRAN) 8 MG tablet Take 1 tablet (8 mg total) by mouth every 8 (eight) hours as needed for nausea or vomiting. 30 tablet 1  . pantoprazole (PROTONIX) 40 MG tablet Take 1 tablet (40 mg total) by mouth daily. 90 tablet 3  . umeclidinium bromide (INCRUSE  ELLIPTA) 62.5 MCG/INH AEPB Inhale 1 puff into the lungs daily. 1 each 3   No current facility-administered medications for this visit.    REVIEW OF SYSTEMS:   A 10+ POINT REVIEW  OF SYSTEMS WAS OBTAINED including neurology, dermatology, psychiatry, cardiac, respiratory, lymph, extremities, GI, GU, Musculoskeletal, constitutional, breasts, reproductive, HEENT.  All pertinent positives are noted in the HPI.  All others are negative.    PHYSICAL EXAMINATION: ECOG PERFORMANCE STATUS: 2 - Symptomatic, <50% confined to bed  Vitals:   12/07/19 1109  BP: (!) 107/54  Pulse: 67  Resp: 16  Temp: 98.9 F (37.2 C)  SpO2: 97%   Filed Weights   12/07/19 1109  Weight: 123 lb (55.8 kg)   .Body mass index is 18.16 kg/m.   GENERAL:alert, in no acute distress and comfortable SKIN: no acute rashes, no significant lesions EYES: conjunctiva are pink and non-injected, sclera anicteric OROPHARYNX: MMM, no exudates, no oropharyngeal erythema or ulceration NECK: supple, no JVD LYMPH:  no palpable lymphadenopathy in the cervical, axillary or inguinal regions LUNGS: clear to auscultation b/l with normal respiratory effort HEART: regular rate & rhythm ABDOMEN:  normoactive bowel sounds , non tender, not distended. Extremity: no pedal edema PSYCH: alert & oriented x 3 with fluent speech NEURO: no focal motor/sensory deficits  LABORATORY DATA:  I have reviewed the data as listed  . CBC Latest Ref Rng & Units 12/07/2019 11/16/2019 10/26/2019  WBC 4.0 - 10.5 K/uL 7.5 6.7 6.1  Hemoglobin 13.0 - 17.0 g/dL 12.8(L) 12.1(L) 12.2(L)  Hematocrit 39.0 - 52.0 % 38.6(L) 37.1(L) 36.6(L)  Platelets 150 - 400 K/uL 148(L) 135(L) 144(L)    . CMP Latest Ref Rng & Units 12/07/2019 11/16/2019 10/26/2019  Glucose 70 - 99 mg/dL 88 93 91  BUN 8 - 23 mg/dL 19 21 18   Creatinine 0.61 - 1.24 mg/dL 0.95 0.91 0.95  Sodium 135 - 145 mmol/L 138 142 140  Potassium 3.5 - 5.1 mmol/L 4.5 4.3 4.2  Chloride 98 - 111 mmol/L 105 106 106  CO2 22 - 32 mmol/L 24 29 27   Calcium 8.9 - 10.3 mg/dL 9.3 9.3 8.7(L)  Total Protein 6.5 - 8.1 g/dL 6.7 6.0(L) 6.5  Total Bilirubin 0.3 - 1.2 mg/dL 0.6 0.3 0.5  Alkaline Phos  38 - 126 U/L 56 55 51  AST 15 - 41 U/L 19 19 17   ALT 0 - 44 U/L 14 12 17         RADIOGRAPHIC STUDIES: I have personally reviewed the radiological images as listed and agreed with the findings in the report. No results found.   ASSESSMENT & PLAN:   74 y.o. male with  1. 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 10/19/18 Lung biopsy revealed Squamous Cell carcinoma S/p Palliative RT to LUL lung mass with 30Gy in 10 fractions between 10/22/18 and 11/03/18, due to impending cord compression  10/19/18 PDL-1 status report found 30% PDL-1 tumor proportion  11/06/18 MRI Brain revealed No intracranial metastatic disease identified. 2. Mild chronic microvascular ischemic changes and volume loss of the brain.  11/10/18 PET/CT revealed Locally advanced left lung mass, as detailed previously. 2. Left perihilar direct extension versus nodal metastasis. 3. No findings of hypermetabolic distant metastasis. 4. No findings of extrathoracic hypermetabolic metastasis.  02/09/19 CT Chest and Abdomen revealed "Today's study demonstrates a positive response to therapy with slight regression of the primary  lesion in the superior segment of the left lower lobe which again demonstrates direct invasion of the adjacent posteromedial left chest wall and vertebral column, as detailed above. 2. However, there is a new hypovascular lesion in segment 7 of the liver. This is nonspecific and too small to characterize, but given the development compared to prior studies, this is concerning for potential metastasis. This could be definitively characterized with MRI of the abdomen with and without IV gadolinium if clinically appropriate. 3. 5 mm nonobstructive calculus in the right renal collecting system. 4. Aortic atherosclerosis, in addition to 3 vessel coronary artery disease. Assessment for  potential risk factor modification, dietary therapy or pharmacologic therapy may be warranted, if clinically Indicated. 5. There are calcifications of the aortic valve. Echocardiographic correlation for evaluation of potential valvular dysfunction may be warranted if clinically indicated. 6. Additional incidental findings, as above." Discussed that I suspect that the liver finding is likely a cyst, as progression through treatment would be unexpected. Nevertheless, will monitor this finding over time with future scans.  S/p 5 cycles of Carboplatin AUC of 5, 157m/m2 Taxol, and Pembrolizumab with G-CSF support completed on 02/16/19.  03/08/19 ECHO which revealed LV EF of 55-60%  05/30/2019 CT Chest w/o contrast ([id number] which revealed "1. Stable post treatment related changes of prior radiation therapy in the left lung. Chronic destructive changes in T5 and T6 vertebrae as well as the posterior aspects of the left fifth and sixth ribs, similar to the prior study. 2. Diffuse bronchial wall thickening with mild to moderate centrilobular and paraseptal emphysema. 3. Aortic atherosclerosis, in addition to three-vessel coronary artery disease. Please note that although the presence of coronary artery calcium documents the presence of coronary artery disease, the severity of this disease and any potential stenosis cannot be assessed on this non-gated CT examination. Assessment for potential risk factor modification, dietary therapy or pharmacologic therapy may be warranted, if clinically indicated. 4. There are calcifications of the aortic valve and mitral valve/annulus. Echocardiographic correlation for evaluation of potential valvular dysfunction may be warranted if clinically indicated. 5. 6 mm nonobstructive calculus in the upper pole collecting system of the right kidney. 6. Additional incidental findings, as above."  09/28/2019 CT Chest ([id number] revealed stable appearance of main mass, some radiation  changes in the lung, no new lung nodules, no new bone changes  2. 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 Smoker 4. 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  Has regularly followed up with ENT Dr. JFenton Malling6. Cancer related pain   PLAN: -Discussed pt labwork today, 12/07/19; of CBC w/diff and CMP is as follows: all values are WNL except for RBC at 3.86, Hemoglobi nat 12.8, HCT at 38.6, Platelets at 148K, Lymph's Abs at 0.6K. -Discussed 11/16/19 of TSH at 1.656 -No lab or clinical or radiographic evidence of Squamous Cell Carcinoma Lung Cancer progression at this time -The pt has no prohibitive toxicities from continuing maintenance Pembrolizumab at this time. -Recommend pt f/u for the second dose of the COVID19 vaccine as scheduled.  -Continue Xgeva every 6 weeks with every other treatment -Refill Marinol  -Will see back in 6 weeks with labs -Will get repeat scans in May, unless new symptoms   FOLLOW UP: -Please schedule next 4 doses of pembrolizumab with labs -MD visit with  every other treatment.  Next MD visit in 6 weeks  -Continue Xgeva every 6 weeks with every other treatment.  The total time spent in the appt was 20 minutes and more than 50% was on counseling and direct patient cares.  All of the patient's questions were answered with apparent satisfaction. The patient knows to call the clinic with any problems, questions or concerns.   Sullivan Lone MD MS AAHIVMS [facility] Hematology/Oncology Physician [facility]  (Office):       [phone] (Work cell):  [phone] (Fax):           [phone]  12/07/2019 11:46 AM  I, Dawayne Cirri am acting as a Education administrator for Dr. Sullivan Lone.   .I have reviewed the above documentation for accuracy and completeness, and I agree  with the above. Brunetta Genera MD

## 2019-12-07 ENCOUNTER — Inpatient Hospital Stay: Payer: No Typology Code available for payment source

## 2019-12-07 ENCOUNTER — Inpatient Hospital Stay: Payer: No Typology Code available for payment source | Admitting: Nutrition

## 2019-12-07 ENCOUNTER — Other Ambulatory Visit: Payer: Self-pay

## 2019-12-07 ENCOUNTER — Inpatient Hospital Stay (HOSPITAL_BASED_OUTPATIENT_CLINIC_OR_DEPARTMENT_OTHER): Payer: No Typology Code available for payment source | Admitting: Hematology

## 2019-12-07 VITALS — BP 107/54 | HR 67 | Temp 98.9°F | Resp 16 | Ht 69.0 in | Wt 123.0 lb

## 2019-12-07 DIAGNOSIS — C799 Secondary malignant neoplasm of unspecified site: Secondary | ICD-10-CM

## 2019-12-07 DIAGNOSIS — C7951 Secondary malignant neoplasm of bone: Secondary | ICD-10-CM

## 2019-12-07 DIAGNOSIS — C3412 Malignant neoplasm of upper lobe, left bronchus or lung: Secondary | ICD-10-CM

## 2019-12-07 DIAGNOSIS — Z7189 Other specified counseling: Secondary | ICD-10-CM

## 2019-12-07 DIAGNOSIS — C3492 Malignant neoplasm of unspecified part of left bronchus or lung: Secondary | ICD-10-CM

## 2019-12-07 DIAGNOSIS — Z5112 Encounter for antineoplastic immunotherapy: Secondary | ICD-10-CM | POA: Diagnosis not present

## 2019-12-07 LAB — CBC WITH DIFFERENTIAL/PLATELET
Abs Immature Granulocytes: 0.02 10*3/uL (ref 0.00–0.07)
Basophils Absolute: 0 10*3/uL (ref 0.0–0.1)
Basophils Relative: 1 %
Eosinophils Absolute: 0.2 10*3/uL (ref 0.0–0.5)
Eosinophils Relative: 3 %
HCT: 38.6 % — ABNORMAL LOW (ref 39.0–52.0)
Hemoglobin: 12.8 g/dL — ABNORMAL LOW (ref 13.0–17.0)
Immature Granulocytes: 0 %
Lymphocytes Relative: 8 %
Lymphs Abs: 0.6 10*3/uL — ABNORMAL LOW (ref 0.7–4.0)
MCH: 33.2 pg (ref 26.0–34.0)
MCHC: 33.2 g/dL (ref 30.0–36.0)
MCV: 100 fL (ref 80.0–100.0)
Monocytes Absolute: 0.7 10*3/uL (ref 0.1–1.0)
Monocytes Relative: 9 %
Neutro Abs: 5.9 10*3/uL (ref 1.7–7.7)
Neutrophils Relative %: 79 %
Platelets: 148 10*3/uL — ABNORMAL LOW (ref 150–400)
RBC: 3.86 MIL/uL — ABNORMAL LOW (ref 4.22–5.81)
RDW: 13 % (ref 11.5–15.5)
WBC: 7.5 10*3/uL (ref 4.0–10.5)
nRBC: 0 % (ref 0.0–0.2)

## 2019-12-07 LAB — CMP (CANCER CENTER ONLY)
ALT: 14 U/L (ref 0–44)
AST: 19 U/L (ref 15–41)
Albumin: 3.8 g/dL (ref 3.5–5.0)
Alkaline Phosphatase: 56 U/L (ref 38–126)
Anion gap: 9 (ref 5–15)
BUN: 19 mg/dL (ref 8–23)
CO2: 24 mmol/L (ref 22–32)
Calcium: 9.3 mg/dL (ref 8.9–10.3)
Chloride: 105 mmol/L (ref 98–111)
Creatinine: 0.95 mg/dL (ref 0.61–1.24)
GFR, Est AFR Am: >60 mL/min (ref >60)
GFR, Estimated: >60 mL/min (ref >60)
Glucose, Bld: 88 mg/dL (ref 70–99)
Potassium: 4.5 mmol/L (ref 3.5–5.1)
Sodium: 138 mmol/L (ref 135–145)
Total Bilirubin: 0.6 mg/dL (ref 0.3–1.2)
Total Protein: 6.7 g/dL (ref 6.5–8.1)

## 2019-12-07 MED ORDER — DENOSUMAB 120 MG/1.7ML ~~LOC~~ SOLN
SUBCUTANEOUS | Status: AC
  Filled 2019-12-07: qty 1.7

## 2019-12-07 MED ORDER — DIPHENHYDRAMINE HCL 50 MG/ML IJ SOLN
INTRAMUSCULAR | Status: AC
  Filled 2019-12-07: qty 1

## 2019-12-07 MED ORDER — FAMOTIDINE IN NACL 20-0.9 MG/50ML-% IV SOLN
20.0000 mg | Freq: Once | INTRAVENOUS | Status: AC
  Administered 2019-12-07: 20 mg via INTRAVENOUS

## 2019-12-07 MED ORDER — SODIUM CHLORIDE 0.9 % IV SOLN
200.0000 mg | Freq: Once | INTRAVENOUS | Status: AC
  Administered 2019-12-07: 200 mg via INTRAVENOUS
  Filled 2019-12-07: qty 8

## 2019-12-07 MED ORDER — HEPARIN SOD (PORK) LOCK FLUSH 100 UNIT/ML IV SOLN
500.0000 [IU] | Freq: Once | INTRAVENOUS | Status: AC | PRN
  Administered 2019-12-07: 500 [IU]
  Filled 2019-12-07: qty 5

## 2019-12-07 MED ORDER — DIPHENHYDRAMINE HCL 50 MG/ML IJ SOLN
25.0000 mg | Freq: Once | INTRAMUSCULAR | Status: AC
  Administered 2019-12-07: 25 mg via INTRAVENOUS

## 2019-12-07 MED ORDER — SODIUM CHLORIDE 0.9% FLUSH
10.0000 mL | INTRAVENOUS | Status: DC | PRN
  Administered 2019-12-07: 10 mL
  Filled 2019-12-07: qty 10

## 2019-12-07 MED ORDER — SODIUM CHLORIDE 0.9 % IV SOLN
Freq: Once | INTRAVENOUS | Status: AC
  Filled 2019-12-07: qty 250

## 2019-12-07 MED ORDER — DENOSUMAB 120 MG/1.7ML ~~LOC~~ SOLN
120.0000 mg | Freq: Once | SUBCUTANEOUS | Status: AC
  Administered 2019-12-07: 120 mg via SUBCUTANEOUS

## 2019-12-07 MED ORDER — FAMOTIDINE IN NACL 20-0.9 MG/50ML-% IV SOLN
INTRAVENOUS | Status: AC
  Filled 2019-12-07: qty 50

## 2019-12-07 NOTE — Patient Instructions (Signed)
[facility] Discharge Instructions for Patients Receiving Chemotherapy  Today you received the following chemotherapy agents: pembrolizumab.  To help prevent nausea and vomiting after your treatment, we encourage you to take your nausea medication as directed.   If you develop nausea and vomiting that is not controlled by your nausea medication, call the clinic.   BELOW ARE SYMPTOMS THAT SHOULD BE REPORTED IMMEDIATELY:  *FEVER GREATER THAN 100.5 F  *CHILLS WITH OR WITHOUT FEVER  NAUSEA AND VOMITING THAT IS NOT CONTROLLED WITH YOUR NAUSEA MEDICATION  *UNUSUAL SHORTNESS OF BREATH  *UNUSUAL BRUISING OR BLEEDING  TENDERNESS IN MOUTH AND THROAT WITH OR WITHOUT PRESENCE OF ULCERS  *URINARY PROBLEMS  *BOWEL PROBLEMS  UNUSUAL RASH Items with * indicate a potential emergency and should be followed up as soon as possible.  Feel free to call the clinic should you have any questions or concerns. The clinic phone number is [phone].  Please show the CHEMO ALERT CARD at check-in to the Emergency Department and triage nurse.   

## 2019-12-07 NOTE — Progress Notes (Signed)
Nutrition follow-up completed with patient during infusion for metastatic lung cancer. Patient reports everything is the same as it always is. He has increased drinking Carnation breakfast essentials to several bottles daily. Constipation continues to be controlled with laxatives. He has no questions or concerns. Weight documented as 123 pounds on March 31.  Unintended weight loss continues.  Intervention: Educated patient to continue strategies for high-calorie, high-protein foods along with oral nutrition supplements to minimize weight loss.  Monitoring, evaluation, goals: Patient will tolerate adequate calories and protein to minimize weight loss.  No follow-up is scheduled patient has my contact information.  **Disclaimer: This note was dictated with voice recognition software. Similar sounding words can inadvertently be transcribed and this note may contain transcription errors which may not have been corrected upon publication of note.**

## 2019-12-09 ENCOUNTER — Telehealth: Payer: Self-pay | Admitting: Hematology

## 2019-12-09 NOTE — Telephone Encounter (Signed)
Scheduled per 03/31 los, spoke with patient's relative and patient will be notified of upcoming appointments.

## 2019-12-22 NOTE — Progress Notes (Signed)
Pharmacist Chemotherapy Monitoring - Follow Up Assessment    I verify that I have reviewed each item in the below checklist:  . Regimen for the patient is scheduled for the appropriate day and plan matches scheduled date. Marland Kitchen Appropriate non-routine labs are ordered dependent on drug ordered. . If applicable, additional medications reviewed and ordered per protocol based on lifetime cumulative doses and/or treatment regimen.   Plan for follow-up and/or issues identified: No . I-vent associated with next due treatment: No . MD and/or nursing notified: No  Kitt Minardi K 12/22/2019 8:52 AM

## 2019-12-28 ENCOUNTER — Inpatient Hospital Stay: Payer: No Typology Code available for payment source

## 2019-12-28 ENCOUNTER — Other Ambulatory Visit: Payer: Self-pay

## 2019-12-28 ENCOUNTER — Inpatient Hospital Stay: Payer: No Typology Code available for payment source | Attending: Hematology

## 2019-12-28 VITALS — BP 115/61 | HR 61 | Temp 98.3°F | Resp 18 | Wt 123.8 lb

## 2019-12-28 DIAGNOSIS — Z7189 Other specified counseling: Secondary | ICD-10-CM

## 2019-12-28 DIAGNOSIS — C7951 Secondary malignant neoplasm of bone: Secondary | ICD-10-CM | POA: Diagnosis present

## 2019-12-28 DIAGNOSIS — C3492 Malignant neoplasm of unspecified part of left bronchus or lung: Secondary | ICD-10-CM

## 2019-12-28 DIAGNOSIS — C3412 Malignant neoplasm of upper lobe, left bronchus or lung: Secondary | ICD-10-CM

## 2019-12-28 DIAGNOSIS — Z5112 Encounter for antineoplastic immunotherapy: Secondary | ICD-10-CM | POA: Insufficient documentation

## 2019-12-28 LAB — CMP (CANCER CENTER ONLY)
ALT: 16 U/L (ref 0–44)
AST: 21 U/L (ref 15–41)
Albumin: 3.6 g/dL (ref 3.5–5.0)
Alkaline Phosphatase: 47 U/L (ref 38–126)
Anion gap: 10 (ref 5–15)
BUN: 20 mg/dL (ref 8–23)
CO2: 26 mmol/L (ref 22–32)
Calcium: 9 mg/dL (ref 8.9–10.3)
Chloride: 105 mmol/L (ref 98–111)
Creatinine: 0.95 mg/dL (ref 0.61–1.24)
GFR, Est AFR Am: >60 mL/min (ref >60)
GFR, Estimated: >60 mL/min (ref >60)
Glucose, Bld: 90 mg/dL (ref 70–99)
Potassium: 4.1 mmol/L (ref 3.5–5.1)
Sodium: 141 mmol/L (ref 135–145)
Total Bilirubin: 0.6 mg/dL (ref 0.3–1.2)
Total Protein: 6.4 g/dL — ABNORMAL LOW (ref 6.5–8.1)

## 2019-12-28 LAB — CBC WITH DIFFERENTIAL/PLATELET
Abs Immature Granulocytes: 0.02 10*3/uL (ref 0.00–0.07)
Basophils Absolute: 0 10*3/uL (ref 0.0–0.1)
Basophils Relative: 0 %
Eosinophils Absolute: 0.2 10*3/uL (ref 0.0–0.5)
Eosinophils Relative: 3 %
HCT: 36.7 % — ABNORMAL LOW (ref 39.0–52.0)
Hemoglobin: 12.3 g/dL — ABNORMAL LOW (ref 13.0–17.0)
Immature Granulocytes: 0 %
Lymphocytes Relative: 7 %
Lymphs Abs: 0.5 10*3/uL — ABNORMAL LOW (ref 0.7–4.0)
MCH: 33.2 pg (ref 26.0–34.0)
MCHC: 33.5 g/dL (ref 30.0–36.0)
MCV: 99.2 fL (ref 80.0–100.0)
Monocytes Absolute: 0.7 10*3/uL (ref 0.1–1.0)
Monocytes Relative: 9 %
Neutro Abs: 5.7 10*3/uL (ref 1.7–7.7)
Neutrophils Relative %: 81 %
Platelets: 126 10*3/uL — ABNORMAL LOW (ref 150–400)
RBC: 3.7 MIL/uL — ABNORMAL LOW (ref 4.22–5.81)
RDW: 13.1 % (ref 11.5–15.5)
WBC: 7.1 10*3/uL (ref 4.0–10.5)
nRBC: 0 % (ref 0.0–0.2)

## 2019-12-28 MED ORDER — SODIUM CHLORIDE 0.9% FLUSH
10.0000 mL | INTRAVENOUS | Status: DC | PRN
  Administered 2019-12-28: 10 mL
  Filled 2019-12-28: qty 10

## 2019-12-28 MED ORDER — HEPARIN SOD (PORK) LOCK FLUSH 100 UNIT/ML IV SOLN
500.0000 [IU] | Freq: Once | INTRAVENOUS | Status: AC | PRN
  Administered 2019-12-28: 500 [IU]
  Filled 2019-12-28: qty 5

## 2019-12-28 MED ORDER — SODIUM CHLORIDE 0.9 % IV SOLN
Freq: Once | INTRAVENOUS | Status: AC
  Filled 2019-12-28: qty 250

## 2019-12-28 MED ORDER — FAMOTIDINE IN NACL 20-0.9 MG/50ML-% IV SOLN
INTRAVENOUS | Status: AC
  Filled 2019-12-28: qty 50

## 2019-12-28 MED ORDER — SODIUM CHLORIDE 0.9 % IV SOLN
200.0000 mg | Freq: Once | INTRAVENOUS | Status: AC
  Administered 2019-12-28: 200 mg via INTRAVENOUS
  Filled 2019-12-28: qty 8

## 2019-12-28 MED ORDER — FAMOTIDINE IN NACL 20-0.9 MG/50ML-% IV SOLN
20.0000 mg | Freq: Once | INTRAVENOUS | Status: AC
  Administered 2019-12-28: 20 mg via INTRAVENOUS

## 2019-12-28 MED ORDER — DIPHENHYDRAMINE HCL 50 MG/ML IJ SOLN
INTRAMUSCULAR | Status: AC
  Filled 2019-12-28: qty 1

## 2019-12-28 MED ORDER — DIPHENHYDRAMINE HCL 50 MG/ML IJ SOLN
25.0000 mg | Freq: Once | INTRAMUSCULAR | Status: AC
  Administered 2019-12-28: 25 mg via INTRAVENOUS

## 2019-12-28 NOTE — Patient Instructions (Signed)
[facility] Discharge Instructions for Patients Receiving Chemotherapy  Today you received the following chemotherapy agents: pembrolizumab.  To help prevent nausea and vomiting after your treatment, we encourage you to take your nausea medication as directed.   If you develop nausea and vomiting that is not controlled by your nausea medication, call the clinic.   BELOW ARE SYMPTOMS THAT SHOULD BE REPORTED IMMEDIATELY:  *FEVER GREATER THAN 100.5 F  *CHILLS WITH OR WITHOUT FEVER  NAUSEA AND VOMITING THAT IS NOT CONTROLLED WITH YOUR NAUSEA MEDICATION  *UNUSUAL SHORTNESS OF BREATH  *UNUSUAL BRUISING OR BLEEDING  TENDERNESS IN MOUTH AND THROAT WITH OR WITHOUT PRESENCE OF ULCERS  *URINARY PROBLEMS  *BOWEL PROBLEMS  UNUSUAL RASH Items with * indicate a potential emergency and should be followed up as soon as possible.  Feel free to call the clinic should you have any questions or concerns. The clinic phone number is [phone].  Please show the CHEMO ALERT CARD at check-in to the Emergency Department and triage nurse.   

## 2019-12-30 ENCOUNTER — Other Ambulatory Visit: Payer: Self-pay | Admitting: Pulmonary Disease

## 2019-12-30 ENCOUNTER — Other Ambulatory Visit: Payer: Self-pay | Admitting: *Deleted

## 2019-12-30 DIAGNOSIS — C3492 Malignant neoplasm of unspecified part of left bronchus or lung: Secondary | ICD-10-CM

## 2019-12-30 MED ORDER — ONDANSETRON HCL 8 MG PO TABS: 8.0000 mg | ORAL_TABLET | Freq: Three times a day (TID) | ORAL | 1 refills | Status: DC | PRN

## 2019-12-30 MED ORDER — MORPHINE SULFATE 15 MG PO TABS: 15.0000 mg | ORAL_TABLET | ORAL | 0 refills | Status: DC | PRN

## 2019-12-30 MED ORDER — FENTANYL 50 MCG/HR TD PT72: 1.0000 | MEDICATED_PATCH | TRANSDERMAL | 0 refills | Status: DC

## 2019-12-30 MED ORDER — MAGNESIUM OXIDE 400 MG PO TABS: 1.0000 | ORAL_TABLET | Freq: Every day | ORAL | 0 refills | Status: DC

## 2019-12-30 NOTE — Telephone Encounter (Signed)
Wife called requested refills of Fentanyl patch, MS IR 15 mg and Magnesium Oxide 4000 mg. Requests routed to MD

## 2019-12-30 NOTE — Telephone Encounter (Signed)
Wife called -  Requested refill of Zofran

## 2020-01-02 NOTE — Progress Notes (Signed)
Virtual Visit via Telephone Note  I connected with Kyle Foster  on 01/03/20 at  2:00 PM EDT by telephone and verified that I am speaking with the correct person using two identifiers.  Location: Patient: Home Provider: [facility] - [address], [place], [place], [place] [address]   I discussed the limitations, risks, security and privacy concerns of performing an evaluation and management service by telephone and the availability of in person appointments. I also discussed with the patient that there may be a patient responsible charge related to this service. The patient expressed understanding and agreed to proceed.  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  Patient also followed in our office for COPD/emphysema  Past medical history: Dyslipidemia, malnutrition, bone metastasis, CAD Smoking history: Current smoker.  0.5 packs/day.  52-pack-year smoking history. Maintenance: Patient of Dr. Loanne Drilling  Chief complaint: Follow up from Aug/4379 OV   74 year old male current smoker followed in our office for COPD.  At last office visit in August/2020 he was started on Trelegy Ellipta.  He has found this to significantly help his breathing in comparison to the previous inhaler which was Incruse Ellipta.  He would like to remain on Trelegy Ellipta.  He is requesting refills from our office.  Looks like these refills were sent in on 12/30/2019.  Patient reports that his breathing has improved as well as he is stopped using his rescue inhaler which she had previously been using 2-3 times a day.  Patient does continue to smoke.  Smoking 0.5 packs/day at the  maximum.  He is not interested in stopping smoking.  He declined smoking cessation recommendations at this time.  Observations/Objective:  Imaging: CT Chest 03/21/19 -severe diffuse centrilobular and paraseptal emphysema, left lower lobe cavitary mass with left chest wall and spinal invasion.  No infiltrate, effusion or edema noted.  PFT: 05/02/2019 FVC 2.65 (62 %) FEV1 1.83 (59 %) Ratio 61 TLC 108 % RV 219% RV/TLC 198% DLCO 63 % Interpretation: Moderately severe obstructive defect with significant bronchodilator response.  Air-trapping and reduced DLCO present which is consistent with emphysema.  TTE: 03/08/2019-EF 55 to 60%, mild to moderate TR, AV calcification, no WMA  Social History   Tobacco Use  Smoking Status Current Every Day Smoker  . Packs/day: 0.50  . Years: 52.00  . Pack years: 26.00  . Types: Cigarettes  . Start date: 1968  Smokeless Tobacco Never Used  Tobacco Comment   03/21/19- smoking 10 cigs a day    Immunization History  Administered Date(s) Administered  . Fluad Quad(high Dose 65+) 05/02/2019  . Influenza, High Dose Seasonal PF 05/24/2018      Assessment and Plan:  Tobacco abuse Current smoker 52-pack-year smoking history Smoking 0.5 packs/day Not interested in stopping smoking  Plan: I recommendations are the patient stop smoking Offered smoking cessation therapies, patient declined Explained to patient that we do have clinical pharmacy team members that also can do smoking cessation visits, patient aware to contact our office if and when he becomes interested  Metastatic cancer [facility]) Plan: Keep follow-up with oncology  Squamous cell lung cancer, left ([place]) Plan: Keep follow-up with oncology  Centrilobular emphysema ([place]) Doing well  on Trelegy Ellipta Current smoker  Plan: We will keep on Trelegy Ellipta Will refill Continue rescue inhaler as needed We recommend stopping smoking  Reviewed signs and symptoms to notify our office if  having worsened breathing Follow-up in 4 months   Follow Up Instructions:  Return in about 4 months (around 05/04/2020), or if symptoms worsen or fail to improve, for Follow up with Dr. Loanne Drilling.   I discussed the assessment and treatment plan with the patient. The patient was provided an opportunity to ask questions and all were answered. The patient agreed with the plan and demonstrated an understanding of the instructions.   The patient was advised to call back or seek an in-person evaluation if the symptoms worsen or if the condition fails to improve as anticipated.  I provided 22 minutes of non-face-to-face time during this encounter.   Lauraine Rinne, NP

## 2020-01-03 ENCOUNTER — Other Ambulatory Visit: Payer: Self-pay

## 2020-01-03 ENCOUNTER — Encounter: Payer: Self-pay | Admitting: Pulmonary Disease

## 2020-01-03 ENCOUNTER — Ambulatory Visit (INDEPENDENT_AMBULATORY_CARE_PROVIDER_SITE_OTHER): Payer: Medicare Other | Admitting: Pulmonary Disease

## 2020-01-03 DIAGNOSIS — J432 Centrilobular emphysema: Secondary | ICD-10-CM | POA: Diagnosis not present

## 2020-01-03 DIAGNOSIS — F17210 Nicotine dependence, cigarettes, uncomplicated: Secondary | ICD-10-CM

## 2020-01-03 DIAGNOSIS — C3492 Malignant neoplasm of unspecified part of left bronchus or lung: Secondary | ICD-10-CM

## 2020-01-03 DIAGNOSIS — Z720 Tobacco use: Secondary | ICD-10-CM

## 2020-01-03 DIAGNOSIS — C7951 Secondary malignant neoplasm of bone: Secondary | ICD-10-CM

## 2020-01-03 NOTE — Assessment & Plan Note (Signed)
Doing well on Trelegy Ellipta Current smoker  Plan: We will keep on Trelegy Ellipta Will refill Continue rescue inhaler as needed We recommend stopping smoking  Reviewed signs and symptoms to notify our office if having worsened breathing Follow-up in 4 months

## 2020-01-03 NOTE — Assessment & Plan Note (Signed)
Plan:  Keep follow up with oncology 

## 2020-01-03 NOTE — Patient Instructions (Addendum)
You were seen today by Lauraine Rinne, NP  for:   1. Tobacco abuse  We recommend that you stop smoking.  >>>You need to set a quit date >>>If you have friends or family who smoke, let them know you are trying to quit and not to smoke around you or in your living environment  Smoking Cessation Resources:  1 800 QUIT NOW  >>> Patient to call this resource and utilize it to help support her quit smoking >>> Keep up your hard work with stopping smoking  You can also contact the [facility] >>>For smoking cessation classes call [phone]  We do not recommend using e-cigarettes as a form of stopping smoking  You can sign up for smoking cessation support texts and information:  >>>https://smokefree.gov/smokefreetxt  2. Malignant neoplasm metastatic to bone ([place]) 3. Squamous cell lung cancer, left [facility])  Keep follow-up with oncology  4. Centrilobular emphysema ([facility])  Trelegy Ellipta  >>> 1 puff daily in the morning >>>rinse mouth out after use  >>> This inhaler contains 3 medications that help manage her respiratory status, contact our office if you cannot afford this medication or unable to remain on this medication  Note your daily symptoms > remember "red flags" for COPD:   >>>Increase in cough >>>increase in sputum production >>>increase in shortness of breath or activity  intolerance.   If you notice these symptoms, please call the office to be seen.     Follow Up:    Return in about 4 months (around 05/04/2020), or if symptoms worsen or fail to improve, for Follow up with Dr. Loanne Drilling.   Please do your part to reduce the spread of COVID-19:      Reduce your risk of any infection  and COVID19 by using the similar precautions used for avoiding the common cold or flu:  Marland Kitchen Wash your hands often with soap and warm water for at least 20 seconds.  If soap and water are not readily available, use an alcohol-based hand sanitizer with at least 60% alcohol.  . If  coughing or sneezing, cover your mouth and nose by coughing or sneezing into the elbow areas of your shirt or coat, into a tissue or into your sleeve (not your hands). Langley Gauss A MASK when in public  . Avoid shaking hands with others and consider head nods or verbal greetings only. . Avoid touching your eyes, nose, or mouth with unwashed hands.  . Avoid close contact with people who are sick. . Avoid places or events with large numbers of people in one location, like concerts or sporting events. . If you have some symptoms but not all symptoms, continue to monitor at home and seek medical attention if your symptoms worsen. . If you are having a medical emergency, call 911.   [place] / e-Visit: [email or web address]         [facility]: [facility]: [phone]                   [facility]: [phone]     It is flu season:   >>> Best ways to protect herself from the flu: Receive the yearly flu vaccine, practice good hand hygiene washing with soap and also using hand sanitizer when available, eat a nutritious meals, get adequate rest, hydrate appropriately   Please contact the office if your symptoms worsen or you have concerns that you  are not improving.   Thank you for choosing [facility] for your healthcare, and for allowing [place] to partner with you on your healthcare journey. I am thankful to be able to provide care to you today.   Wyn Quaker FNP-C

## 2020-01-03 NOTE — Assessment & Plan Note (Signed)
>>  ASSESSMENT AND PLAN FOR CENTRILOBULAR EMPHYSEMA ([place]) WRITTEN ON 01/03/2020  1:27 PM BY MACK, BRIAN P, NP  Doing well on Trelegy Ellipta Current smoker  Plan: We will keep on Trelegy Ellipta Will refill Continue rescue inhaler as needed We recommend stopping smoking  Reviewed signs and symptoms to notify our office if having worsened breathing Follow-up in 4 months

## 2020-01-03 NOTE — Assessment & Plan Note (Signed)
Current smoker 52-pack-year smoking history Smoking 0.5 packs/day Not interested in stopping smoking  Plan: I recommendations are the patient stop smoking Offered smoking cessation therapies, patient declined Explained to patient that we do have clinical pharmacy team members that also can do smoking cessation visits, patient aware to contact our office if and when he becomes interested

## 2020-01-04 ENCOUNTER — Other Ambulatory Visit: Payer: Self-pay | Admitting: *Deleted

## 2020-01-04 DIAGNOSIS — C3492 Malignant neoplasm of unspecified part of left bronchus or lung: Secondary | ICD-10-CM

## 2020-01-04 NOTE — Telephone Encounter (Signed)
Medication refills requested - changed to escribe - were printed in error

## 2020-01-05 MED ORDER — MORPHINE SULFATE 15 MG PO TABS: 15.0000 mg | ORAL_TABLET | ORAL | 0 refills | Status: DC | PRN

## 2020-01-05 MED ORDER — FENTANYL 50 MCG/HR TD PT72: 1.0000 | MEDICATED_PATCH | TRANSDERMAL | 0 refills | Status: DC

## 2020-01-12 NOTE — Progress Notes (Signed)
Pharmacist Chemotherapy Monitoring - Follow Up Assessment    I verify that I have reviewed each item in the below checklist:  . Regimen for the patient is scheduled for the appropriate day and plan matches scheduled date. Marland Kitchen Appropriate non-routine labs are ordered dependent on drug ordered. . If applicable, additional medications reviewed and ordered per protocol based on lifetime cumulative doses and/or treatment regimen.   Plan for follow-up and/or issues identified: No . I-vent associated with next due treatment: No . MD and/or nursing notified: No  Romualdo Bolk [facility] 01/12/2020 11:23 AM

## 2020-01-18 ENCOUNTER — Inpatient Hospital Stay: Payer: No Typology Code available for payment source

## 2020-01-18 ENCOUNTER — Inpatient Hospital Stay (HOSPITAL_BASED_OUTPATIENT_CLINIC_OR_DEPARTMENT_OTHER): Payer: No Typology Code available for payment source | Admitting: Hematology

## 2020-01-18 ENCOUNTER — Inpatient Hospital Stay: Payer: No Typology Code available for payment source | Attending: Hematology

## 2020-01-18 ENCOUNTER — Other Ambulatory Visit: Payer: Self-pay

## 2020-01-18 ENCOUNTER — Telehealth: Payer: Self-pay | Admitting: Hematology

## 2020-01-18 VITALS — BP 100/58 | HR 63 | Temp 98.0°F | Resp 16 | Wt 121.4 lb

## 2020-01-18 DIAGNOSIS — Z9221 Personal history of antineoplastic chemotherapy: Secondary | ICD-10-CM | POA: Insufficient documentation

## 2020-01-18 DIAGNOSIS — C3412 Malignant neoplasm of upper lobe, left bronchus or lung: Secondary | ICD-10-CM | POA: Diagnosis present

## 2020-01-18 DIAGNOSIS — Z85858 Personal history of malignant neoplasm of other endocrine glands: Secondary | ICD-10-CM | POA: Diagnosis not present

## 2020-01-18 DIAGNOSIS — Z7189 Other specified counseling: Secondary | ICD-10-CM

## 2020-01-18 DIAGNOSIS — Z5112 Encounter for antineoplastic immunotherapy: Secondary | ICD-10-CM

## 2020-01-18 DIAGNOSIS — F329 Major depressive disorder, single episode, unspecified: Secondary | ICD-10-CM | POA: Insufficient documentation

## 2020-01-18 DIAGNOSIS — M899 Disorder of bone, unspecified: Secondary | ICD-10-CM

## 2020-01-18 DIAGNOSIS — Z85828 Personal history of other malignant neoplasm of skin: Secondary | ICD-10-CM | POA: Diagnosis not present

## 2020-01-18 DIAGNOSIS — Z8249 Family history of ischemic heart disease and other diseases of the circulatory system: Secondary | ICD-10-CM | POA: Insufficient documentation

## 2020-01-18 DIAGNOSIS — I10 Essential (primary) hypertension: Secondary | ICD-10-CM | POA: Diagnosis not present

## 2020-01-18 DIAGNOSIS — C7951 Secondary malignant neoplasm of bone: Secondary | ICD-10-CM

## 2020-01-18 DIAGNOSIS — I252 Old myocardial infarction: Secondary | ICD-10-CM | POA: Insufficient documentation

## 2020-01-18 DIAGNOSIS — G893 Neoplasm related pain (acute) (chronic): Secondary | ICD-10-CM | POA: Insufficient documentation

## 2020-01-18 DIAGNOSIS — Z801 Family history of malignant neoplasm of trachea, bronchus and lung: Secondary | ICD-10-CM | POA: Diagnosis not present

## 2020-01-18 DIAGNOSIS — Z833 Family history of diabetes mellitus: Secondary | ICD-10-CM | POA: Insufficient documentation

## 2020-01-18 DIAGNOSIS — Z823 Family history of stroke: Secondary | ICD-10-CM | POA: Insufficient documentation

## 2020-01-18 DIAGNOSIS — Z808 Family history of malignant neoplasm of other organs or systems: Secondary | ICD-10-CM | POA: Diagnosis not present

## 2020-01-18 DIAGNOSIS — F17210 Nicotine dependence, cigarettes, uncomplicated: Secondary | ICD-10-CM | POA: Diagnosis not present

## 2020-01-18 DIAGNOSIS — C3492 Malignant neoplasm of unspecified part of left bronchus or lung: Secondary | ICD-10-CM | POA: Diagnosis not present

## 2020-01-18 DIAGNOSIS — E785 Hyperlipidemia, unspecified: Secondary | ICD-10-CM | POA: Insufficient documentation

## 2020-01-18 DIAGNOSIS — Z8551 Personal history of malignant neoplasm of bladder: Secondary | ICD-10-CM | POA: Insufficient documentation

## 2020-01-18 LAB — CMP (CANCER CENTER ONLY)
ALT: 15 U/L (ref 0–44)
AST: 20 U/L (ref 15–41)
Albumin: 3.7 g/dL (ref 3.5–5.0)
Alkaline Phosphatase: 53 U/L (ref 38–126)
Anion gap: 9 (ref 5–15)
BUN: 23 mg/dL (ref 8–23)
CO2: 26 mmol/L (ref 22–32)
Calcium: 9.5 mg/dL (ref 8.9–10.3)
Chloride: 103 mmol/L (ref 98–111)
Creatinine: 1.04 mg/dL (ref 0.61–1.24)
GFR, Est AFR Am: >60 mL/min (ref >60)
GFR, Estimated: >60 mL/min (ref >60)
Glucose, Bld: 82 mg/dL (ref 70–99)
Potassium: 4.5 mmol/L (ref 3.5–5.1)
Sodium: 138 mmol/L (ref 135–145)
Total Bilirubin: 0.6 mg/dL (ref 0.3–1.2)
Total Protein: 6.4 g/dL — ABNORMAL LOW (ref 6.5–8.1)

## 2020-01-18 LAB — CBC WITH DIFFERENTIAL/PLATELET
Abs Immature Granulocytes: 0.01 10*3/uL (ref 0.00–0.07)
Basophils Absolute: 0 10*3/uL (ref 0.0–0.1)
Basophils Relative: 0 %
Eosinophils Absolute: 0.1 10*3/uL (ref 0.0–0.5)
Eosinophils Relative: 2 %
HCT: 38 % — ABNORMAL LOW (ref 39.0–52.0)
Hemoglobin: 12.8 g/dL — ABNORMAL LOW (ref 13.0–17.0)
Immature Granulocytes: 0 %
Lymphocytes Relative: 8 %
Lymphs Abs: 0.5 10*3/uL — ABNORMAL LOW (ref 0.7–4.0)
MCH: 33.4 pg (ref 26.0–34.0)
MCHC: 33.7 g/dL (ref 30.0–36.0)
MCV: 99.2 fL (ref 80.0–100.0)
Monocytes Absolute: 0.7 10*3/uL (ref 0.1–1.0)
Monocytes Relative: 10 %
Neutro Abs: 5.7 10*3/uL (ref 1.7–7.7)
Neutrophils Relative %: 80 %
Platelets: 141 10*3/uL — ABNORMAL LOW (ref 150–400)
RBC: 3.83 MIL/uL — ABNORMAL LOW (ref 4.22–5.81)
RDW: 13 % (ref 11.5–15.5)
WBC: 7 10*3/uL (ref 4.0–10.5)
nRBC: 0 % (ref 0.0–0.2)

## 2020-01-18 MED ORDER — HEPARIN SOD (PORK) LOCK FLUSH 100 UNIT/ML IV SOLN
500.0000 [IU] | Freq: Once | INTRAVENOUS | Status: AC | PRN
  Administered 2020-01-18: 500 [IU]
  Filled 2020-01-18: qty 5

## 2020-01-18 MED ORDER — SODIUM CHLORIDE 0.9 % IV SOLN
Freq: Once | INTRAVENOUS | Status: AC
  Filled 2020-01-18: qty 250

## 2020-01-18 MED ORDER — SODIUM CHLORIDE 0.9% FLUSH
10.0000 mL | INTRAVENOUS | Status: DC | PRN
  Administered 2020-01-18: 10 mL
  Filled 2020-01-18: qty 10

## 2020-01-18 MED ORDER — FAMOTIDINE IN NACL 20-0.9 MG/50ML-% IV SOLN
INTRAVENOUS | Status: AC
  Filled 2020-01-18: qty 50

## 2020-01-18 MED ORDER — DENOSUMAB 120 MG/1.7ML ~~LOC~~ SOLN
SUBCUTANEOUS | Status: AC
  Filled 2020-01-18: qty 1.7

## 2020-01-18 MED ORDER — DIPHENHYDRAMINE HCL 50 MG/ML IJ SOLN
INTRAMUSCULAR | Status: AC
  Filled 2020-01-18: qty 1

## 2020-01-18 MED ORDER — DENOSUMAB 120 MG/1.7ML ~~LOC~~ SOLN
120.0000 mg | Freq: Once | SUBCUTANEOUS | Status: AC
  Administered 2020-01-18: 120 mg via SUBCUTANEOUS

## 2020-01-18 MED ORDER — SODIUM CHLORIDE 0.9 % IV SOLN
200.0000 mg | Freq: Once | INTRAVENOUS | Status: AC
  Administered 2020-01-18: 200 mg via INTRAVENOUS
  Filled 2020-01-18: qty 8

## 2020-01-18 MED ORDER — FAMOTIDINE IN NACL 20-0.9 MG/50ML-% IV SOLN
20.0000 mg | Freq: Once | INTRAVENOUS | Status: AC
  Administered 2020-01-18: 20 mg via INTRAVENOUS

## 2020-01-18 MED ORDER — DIPHENHYDRAMINE HCL 50 MG/ML IJ SOLN
25.0000 mg | Freq: Once | INTRAMUSCULAR | Status: AC
  Administered 2020-01-18: 25 mg via INTRAVENOUS

## 2020-01-18 NOTE — Telephone Encounter (Signed)
Faxed medical records to [facility] Fax: [phone] Release C.K:[id number]
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
## 2020-01-18 NOTE — Patient Instructions (Signed)
[facility] Discharge Instructions for Patients Receiving Chemotherapy  Today you received the following chemotherapy agents: Pembrolizumab Marcelino Freestone) and Delton See  To help prevent nausea and vomiting after your treatment, we encourage you to take your nausea medication as directed.   If you develop nausea and vomiting that is not controlled by your nausea medication, call the clinic.   BELOW ARE SYMPTOMS THAT SHOULD BE REPORTED IMMEDIATELY:  *FEVER GREATER THAN 100.5 F  *CHILLS WITH OR WITHOUT FEVER  NAUSEA AND VOMITING THAT IS NOT CONTROLLED WITH YOUR NAUSEA MEDICATION  *UNUSUAL SHORTNESS OF BREATH  *UNUSUAL BRUISING OR BLEEDING  TENDERNESS IN MOUTH AND THROAT WITH OR WITHOUT PRESENCE OF ULCERS  *URINARY PROBLEMS  *BOWEL PROBLEMS  UNUSUAL RASH Items with * indicate a potential emergency and should be followed up as soon as possible.  Feel free to call the clinic should you have any questions or concerns. The clinic phone number is [phone].  Please show the [place] at check-in to the Emergency Department and triage nurse.

## 2020-01-27 ENCOUNTER — Telehealth: Payer: Self-pay | Admitting: Hematology

## 2020-01-27 NOTE — Telephone Encounter (Signed)
Scheduled per 05/12 los, called patient and left a voicemail regarding upcoming appointments.

## 2020-01-31 ENCOUNTER — Other Ambulatory Visit: Payer: Self-pay | Admitting: *Deleted

## 2020-01-31 DIAGNOSIS — C3492 Malignant neoplasm of unspecified part of left bronchus or lung: Secondary | ICD-10-CM

## 2020-01-31 NOTE — Telephone Encounter (Signed)
Patient's wife requested refills of Fentanyl, Morphine and Dronabinol. Requests sent to Dr [name]

## 2020-02-01 MED ORDER — FENTANYL 50 MCG/HR TD PT72: 1.0000 | MEDICATED_PATCH | TRANSDERMAL | 0 refills | Status: DC

## 2020-02-01 MED ORDER — DRONABINOL 5 MG PO CAPS: 5.0000 mg | ORAL_CAPSULE | Freq: Two times a day (BID) | ORAL | 0 refills | Status: DC

## 2020-02-01 MED ORDER — MORPHINE SULFATE 15 MG PO TABS
15.0000 mg | ORAL_TABLET | ORAL | 0 refills | Status: DC | PRN
Start: 2020-01-31 — End: 2020-05-10

## 2020-02-02 ENCOUNTER — Ambulatory Visit (HOSPITAL_COMMUNITY)
Admission: RE | Admit: 2020-02-02 | Discharge: 2020-02-02 | Disposition: A | Discharge status: Home / Self Care | Payer: No Typology Code available for payment source | Source: Ambulatory Visit | Attending: Hematology | Admitting: Hematology

## 2020-02-02 DIAGNOSIS — C3492 Malignant neoplasm of unspecified part of left bronchus or lung: Secondary | ICD-10-CM

## 2020-02-02 DIAGNOSIS — M899 Disorder of bone, unspecified: Secondary | ICD-10-CM | POA: Diagnosis present

## 2020-02-02 DIAGNOSIS — Z5112 Encounter for antineoplastic immunotherapy: Secondary | ICD-10-CM | POA: Diagnosis present

## 2020-02-02 DIAGNOSIS — C7951 Secondary malignant neoplasm of bone: Secondary | ICD-10-CM | POA: Diagnosis present

## 2020-02-02 MED ORDER — IOHEXOL 300 MG/ML  SOLN
100.0000 mL | Freq: Once | INTRAMUSCULAR | Status: AC | PRN
  Administered 2020-02-02: 100 mL via INTRAVENOUS

## 2020-02-07 ENCOUNTER — Other Ambulatory Visit: Payer: Self-pay | Admitting: Hematology

## 2020-02-07 DIAGNOSIS — M899 Disorder of bone, unspecified: Secondary | ICD-10-CM

## 2020-02-08 ENCOUNTER — Other Ambulatory Visit: Payer: Self-pay

## 2020-02-08 ENCOUNTER — Inpatient Hospital Stay: Payer: No Typology Code available for payment source | Attending: Hematology

## 2020-02-08 ENCOUNTER — Inpatient Hospital Stay: Payer: No Typology Code available for payment source

## 2020-02-08 VITALS — BP 113/54 | HR 62 | Temp 97.9°F | Resp 18 | Wt 122.0 lb

## 2020-02-08 DIAGNOSIS — Z808 Family history of malignant neoplasm of other organs or systems: Secondary | ICD-10-CM | POA: Insufficient documentation

## 2020-02-08 DIAGNOSIS — C3492 Malignant neoplasm of unspecified part of left bronchus or lung: Secondary | ICD-10-CM

## 2020-02-08 DIAGNOSIS — C7951 Secondary malignant neoplasm of bone: Secondary | ICD-10-CM

## 2020-02-08 DIAGNOSIS — Z5112 Encounter for antineoplastic immunotherapy: Secondary | ICD-10-CM | POA: Diagnosis not present

## 2020-02-08 DIAGNOSIS — G893 Neoplasm related pain (acute) (chronic): Secondary | ICD-10-CM | POA: Diagnosis not present

## 2020-02-08 DIAGNOSIS — Z833 Family history of diabetes mellitus: Secondary | ICD-10-CM | POA: Insufficient documentation

## 2020-02-08 DIAGNOSIS — M899 Disorder of bone, unspecified: Secondary | ICD-10-CM

## 2020-02-08 DIAGNOSIS — Z823 Family history of stroke: Secondary | ICD-10-CM | POA: Insufficient documentation

## 2020-02-08 DIAGNOSIS — Z8249 Family history of ischemic heart disease and other diseases of the circulatory system: Secondary | ICD-10-CM | POA: Diagnosis not present

## 2020-02-08 DIAGNOSIS — F17210 Nicotine dependence, cigarettes, uncomplicated: Secondary | ICD-10-CM | POA: Insufficient documentation

## 2020-02-08 DIAGNOSIS — Z7189 Other specified counseling: Secondary | ICD-10-CM

## 2020-02-08 DIAGNOSIS — Z79899 Other long term (current) drug therapy: Secondary | ICD-10-CM | POA: Insufficient documentation

## 2020-02-08 DIAGNOSIS — Z8551 Personal history of malignant neoplasm of bladder: Secondary | ICD-10-CM | POA: Insufficient documentation

## 2020-02-08 DIAGNOSIS — C3412 Malignant neoplasm of upper lobe, left bronchus or lung: Secondary | ICD-10-CM | POA: Diagnosis present

## 2020-02-08 DIAGNOSIS — Z801 Family history of malignant neoplasm of trachea, bronchus and lung: Secondary | ICD-10-CM | POA: Diagnosis not present

## 2020-02-08 LAB — CBC WITH DIFFERENTIAL/PLATELET
Abs Immature Granulocytes: 0.03 10*3/uL (ref 0.00–0.07)
Basophils Absolute: 0 10*3/uL (ref 0.0–0.1)
Basophils Relative: 0 %
Eosinophils Absolute: 0.1 10*3/uL (ref 0.0–0.5)
Eosinophils Relative: 2 %
HCT: 36.4 % — ABNORMAL LOW (ref 39.0–52.0)
Hemoglobin: 12.1 g/dL — ABNORMAL LOW (ref 13.0–17.0)
Immature Granulocytes: 0 %
Lymphocytes Relative: 7 %
Lymphs Abs: 0.5 10*3/uL — ABNORMAL LOW (ref 0.7–4.0)
MCH: 33.1 pg (ref 26.0–34.0)
MCHC: 33.2 g/dL (ref 30.0–36.0)
MCV: 99.5 fL (ref 80.0–100.0)
Monocytes Absolute: 0.7 10*3/uL (ref 0.1–1.0)
Monocytes Relative: 10 %
Neutro Abs: 5.8 10*3/uL (ref 1.7–7.7)
Neutrophils Relative %: 81 %
Platelets: 126 10*3/uL — ABNORMAL LOW (ref 150–400)
RBC: 3.66 MIL/uL — ABNORMAL LOW (ref 4.22–5.81)
RDW: 13 % (ref 11.5–15.5)
WBC: 7.2 10*3/uL (ref 4.0–10.5)
nRBC: 0 % (ref 0.0–0.2)

## 2020-02-08 LAB — CMP (CANCER CENTER ONLY)
ALT: 19 U/L (ref 0–44)
AST: 19 U/L (ref 15–41)
Albumin: 3.6 g/dL (ref 3.5–5.0)
Alkaline Phosphatase: 48 U/L (ref 38–126)
Anion gap: 9 (ref 5–15)
BUN: 26 mg/dL — ABNORMAL HIGH (ref 8–23)
CO2: 25 mmol/L (ref 22–32)
Calcium: 9.4 mg/dL (ref 8.9–10.3)
Chloride: 105 mmol/L (ref 98–111)
Creatinine: 1.02 mg/dL (ref 0.61–1.24)
GFR, Est AFR Am: >60 mL/min (ref >60)
GFR, Estimated: >60 mL/min (ref >60)
Glucose, Bld: 87 mg/dL (ref 70–99)
Potassium: 4.3 mmol/L (ref 3.5–5.1)
Sodium: 139 mmol/L (ref 135–145)
Total Bilirubin: 0.5 mg/dL (ref 0.3–1.2)
Total Protein: 6.2 g/dL — ABNORMAL LOW (ref 6.5–8.1)

## 2020-02-08 MED ORDER — FAMOTIDINE IN NACL 20-0.9 MG/50ML-% IV SOLN
INTRAVENOUS | Status: AC
  Filled 2020-02-08: qty 50

## 2020-02-08 MED ORDER — SODIUM CHLORIDE 0.9% FLUSH
10.0000 mL | INTRAVENOUS | Status: DC | PRN
  Administered 2020-02-08: 10 mL
  Filled 2020-02-08: qty 10

## 2020-02-08 MED ORDER — HEPARIN SOD (PORK) LOCK FLUSH 100 UNIT/ML IV SOLN
500.0000 [IU] | Freq: Once | INTRAVENOUS | Status: AC | PRN
  Administered 2020-02-08: 500 [IU]
  Filled 2020-02-08: qty 5

## 2020-02-08 MED ORDER — SODIUM CHLORIDE 0.9 % IV SOLN
200.0000 mg | Freq: Once | INTRAVENOUS | Status: AC
  Administered 2020-02-08: 200 mg via INTRAVENOUS
  Filled 2020-02-08: qty 8

## 2020-02-08 MED ORDER — DIPHENHYDRAMINE HCL 50 MG/ML IJ SOLN
INTRAMUSCULAR | Status: AC
  Filled 2020-02-08: qty 1

## 2020-02-08 MED ORDER — SODIUM CHLORIDE 0.9 % IV SOLN
Freq: Once | INTRAVENOUS | Status: AC
  Filled 2020-02-08: qty 250

## 2020-02-08 MED ORDER — FAMOTIDINE IN NACL 20-0.9 MG/50ML-% IV SOLN
20.0000 mg | Freq: Once | INTRAVENOUS | Status: AC
  Administered 2020-02-08: 20 mg via INTRAVENOUS

## 2020-02-08 MED ORDER — DIPHENHYDRAMINE HCL 50 MG/ML IJ SOLN
25.0000 mg | Freq: Once | INTRAMUSCULAR | Status: AC
  Administered 2020-02-08: 25 mg via INTRAVENOUS

## 2020-02-08 NOTE — Patient Instructions (Signed)
[facility] Discharge Instructions for Patients Receiving Chemotherapy  Today you received the following chemotherapy agents: pembrolizumab.  To help prevent nausea and vomiting after your treatment, we encourage you to take your nausea medication as directed.   If you develop nausea and vomiting that is not controlled by your nausea medication, call the clinic.   BELOW ARE SYMPTOMS THAT SHOULD BE REPORTED IMMEDIATELY:  *FEVER GREATER THAN 100.5 F  *CHILLS WITH OR WITHOUT FEVER  NAUSEA AND VOMITING THAT IS NOT CONTROLLED WITH YOUR NAUSEA MEDICATION  *UNUSUAL SHORTNESS OF BREATH  *UNUSUAL BRUISING OR BLEEDING  TENDERNESS IN MOUTH AND THROAT WITH OR WITHOUT PRESENCE OF ULCERS  *URINARY PROBLEMS  *BOWEL PROBLEMS  UNUSUAL RASH Items with * indicate a potential emergency and should be followed up as soon as possible.  Feel free to call the clinic should you have any questions or concerns. The clinic phone number is [phone].  Please show the CHEMO ALERT CARD at check-in to the Emergency Department and triage nurse.   

## 2020-02-09 ENCOUNTER — Ambulatory Visit (HOSPITAL_COMMUNITY): Admission: RE | Admit: 2020-02-09 | Payer: No Typology Code available for payment source | Source: Ambulatory Visit

## 2020-02-19 ENCOUNTER — Encounter (HOSPITAL_BASED_OUTPATIENT_CLINIC_OR_DEPARTMENT_OTHER): Payer: Self-pay | Admitting: Emergency Medicine

## 2020-02-19 ENCOUNTER — Emergency Department (HOSPITAL_BASED_OUTPATIENT_CLINIC_OR_DEPARTMENT_OTHER): Payer: Medicare Other

## 2020-02-19 ENCOUNTER — Inpatient Hospital Stay (HOSPITAL_BASED_OUTPATIENT_CLINIC_OR_DEPARTMENT_OTHER)
Admission: AD | Admit: 2020-02-19 | Discharge: 2020-02-23 | DRG: 871 | Disposition: A | Discharge status: Home Health | Payer: Medicare Other | Attending: Internal Medicine | Admitting: Internal Medicine

## 2020-02-19 ENCOUNTER — Other Ambulatory Visit: Payer: Self-pay

## 2020-02-19 DIAGNOSIS — Z681 Body mass index (BMI) 19 or less, adult: Secondary | ICD-10-CM | POA: Diagnosis not present

## 2020-02-19 DIAGNOSIS — I2510 Atherosclerotic heart disease of native coronary artery without angina pectoris: Secondary | ICD-10-CM | POA: Diagnosis not present

## 2020-02-19 DIAGNOSIS — I2699 Other pulmonary embolism without acute cor pulmonale: Secondary | ICD-10-CM | POA: Diagnosis not present

## 2020-02-19 DIAGNOSIS — J42 Unspecified chronic bronchitis: Secondary | ICD-10-CM | POA: Diagnosis present

## 2020-02-19 DIAGNOSIS — E782 Mixed hyperlipidemia: Secondary | ICD-10-CM | POA: Diagnosis not present

## 2020-02-19 DIAGNOSIS — F17210 Nicotine dependence, cigarettes, uncomplicated: Secondary | ICD-10-CM | POA: Diagnosis not present

## 2020-02-19 DIAGNOSIS — Z85828 Personal history of other malignant neoplasm of skin: Secondary | ICD-10-CM

## 2020-02-19 DIAGNOSIS — C7951 Secondary malignant neoplasm of bone: Secondary | ICD-10-CM | POA: Diagnosis not present

## 2020-02-19 DIAGNOSIS — J439 Emphysema, unspecified: Secondary | ICD-10-CM | POA: Diagnosis not present

## 2020-02-19 DIAGNOSIS — J189 Pneumonia, unspecified organism: Secondary | ICD-10-CM

## 2020-02-19 DIAGNOSIS — Z8551 Personal history of malignant neoplasm of bladder: Secondary | ICD-10-CM | POA: Diagnosis not present

## 2020-02-19 DIAGNOSIS — Z923 Personal history of irradiation: Secondary | ICD-10-CM | POA: Diagnosis not present

## 2020-02-19 DIAGNOSIS — A419 Sepsis, unspecified organism: Principal | ICD-10-CM | POA: Diagnosis present

## 2020-02-19 DIAGNOSIS — R3129 Other microscopic hematuria: Secondary | ICD-10-CM | POA: Diagnosis present

## 2020-02-19 DIAGNOSIS — C3492 Malignant neoplasm of unspecified part of left bronchus or lung: Secondary | ICD-10-CM | POA: Diagnosis not present

## 2020-02-19 DIAGNOSIS — Z955 Presence of coronary angioplasty implant and graft: Secondary | ICD-10-CM | POA: Diagnosis not present

## 2020-02-19 DIAGNOSIS — Z20822 Contact with and (suspected) exposure to covid-19: Secondary | ICD-10-CM | POA: Diagnosis present

## 2020-02-19 DIAGNOSIS — Z9221 Personal history of antineoplastic chemotherapy: Secondary | ICD-10-CM

## 2020-02-19 DIAGNOSIS — I252 Old myocardial infarction: Secondary | ICD-10-CM | POA: Diagnosis not present

## 2020-02-19 DIAGNOSIS — J449 Chronic obstructive pulmonary disease, unspecified: Secondary | ICD-10-CM | POA: Diagnosis not present

## 2020-02-19 DIAGNOSIS — J690 Pneumonitis due to inhalation of food and vomit: Secondary | ICD-10-CM | POA: Diagnosis present

## 2020-02-19 DIAGNOSIS — I10 Essential (primary) hypertension: Secondary | ICD-10-CM | POA: Diagnosis present

## 2020-02-19 DIAGNOSIS — K219 Gastro-esophageal reflux disease without esophagitis: Secondary | ICD-10-CM | POA: Diagnosis not present

## 2020-02-19 DIAGNOSIS — C3490 Malignant neoplasm of unspecified part of unspecified bronchus or lung: Secondary | ICD-10-CM | POA: Diagnosis present

## 2020-02-19 DIAGNOSIS — E43 Unspecified severe protein-calorie malnutrition: Secondary | ICD-10-CM

## 2020-02-19 HISTORY — DX: Pneumonitis due to inhalation of food and vomit: J69.0

## 2020-02-19 HISTORY — DX: Other pulmonary embolism without acute cor pulmonale: I26.99

## 2020-02-19 LAB — URINALYSIS, ROUTINE W REFLEX MICROSCOPIC
Bilirubin Urine: NEGATIVE
Glucose, UA: NEGATIVE mg/dL
Ketones, ur: NEGATIVE mg/dL
Leukocytes,Ua: NEGATIVE
Nitrite: NEGATIVE
Protein, ur: 100 mg/dL — AB
Specific Gravity, Urine: 1.025 (ref 1.005–1.030)
pH: 5.5 (ref 5.0–8.0)

## 2020-02-19 LAB — COMPREHENSIVE METABOLIC PANEL
ALT: 20 U/L (ref 0–44)
AST: 26 U/L (ref 15–41)
Albumin: 3.7 g/dL (ref 3.5–5.0)
Alkaline Phosphatase: 54 U/L (ref 38–126)
Anion gap: 11 (ref 5–15)
BUN: 24 mg/dL — ABNORMAL HIGH (ref 8–23)
CO2: 26 mmol/L (ref 22–32)
Calcium: 8.8 mg/dL — ABNORMAL LOW (ref 8.9–10.3)
Chloride: 99 mmol/L (ref 98–111)
Creatinine, Ser: 1.09 mg/dL (ref 0.61–1.24)
GFR calc Af Amer: >60 mL/min (ref >60)
GFR calc non Af Amer: >60 mL/min (ref >60)
Glucose, Bld: 121 mg/dL — ABNORMAL HIGH (ref 70–99)
Potassium: 4.7 mmol/L (ref 3.5–5.1)
Sodium: 136 mmol/L (ref 135–145)
Total Bilirubin: 0.7 mg/dL (ref 0.3–1.2)
Total Protein: 7.3 g/dL (ref 6.5–8.1)

## 2020-02-19 LAB — CBC
HCT: 39.9 % (ref 39.0–52.0)
Hemoglobin: 13.3 g/dL (ref 13.0–17.0)
MCH: 32.9 pg (ref 26.0–34.0)
MCHC: 33.3 g/dL (ref 30.0–36.0)
MCV: 98.8 fL (ref 80.0–100.0)
Platelets: 126 10*3/uL — ABNORMAL LOW (ref 150–400)
RBC: 4.04 MIL/uL — ABNORMAL LOW (ref 4.22–5.81)
RDW: 13.2 % (ref 11.5–15.5)
WBC: 10.1 10*3/uL (ref 4.0–10.5)
nRBC: 0 % (ref 0.0–0.2)

## 2020-02-19 LAB — URINALYSIS, MICROSCOPIC (REFLEX)

## 2020-02-19 LAB — PROTIME-INR
INR: 1.1 (ref 0.8–1.2)
Prothrombin Time: 13.4 seconds (ref 11.4–15.2)

## 2020-02-19 LAB — LACTIC ACID, PLASMA: Lactic Acid, Venous: 1.5 mmol/L (ref 0.5–1.9)

## 2020-02-19 LAB — SARS CORONAVIRUS 2 BY RT PCR (HOSPITAL ORDER, PERFORMED IN ~~LOC~~ HOSPITAL LAB): SARS Coronavirus 2: NEGATIVE

## 2020-02-19 MED ORDER — ACETAMINOPHEN 500 MG PO TABS
1000.0000 mg | ORAL_TABLET | Freq: Once | ORAL | Status: AC
  Administered 2020-02-19: 1000 mg via ORAL
  Filled 2020-02-19: qty 2

## 2020-02-19 MED ORDER — METRONIDAZOLE IN NACL 5-0.79 MG/ML-% IV SOLN
500.0000 mg | Freq: Once | INTRAVENOUS | Status: AC
  Administered 2020-02-19: 500 mg via INTRAVENOUS
  Filled 2020-02-19: qty 100

## 2020-02-19 MED ORDER — PANTOPRAZOLE SODIUM 40 MG PO TBEC
40.0000 mg | DELAYED_RELEASE_TABLET | Freq: Every day | ORAL | Status: DC
  Administered 2020-02-20 – 2020-02-23 (×4): 40 mg via ORAL
  Filled 2020-02-19 (×4): qty 1

## 2020-02-19 MED ORDER — UMECLIDINIUM BROMIDE 62.5 MCG/INH IN AEPB
1.0000 | INHALATION_SPRAY | Freq: Every day | RESPIRATORY_TRACT | Status: DC
  Administered 2020-02-20 – 2020-02-23 (×4): 1 via RESPIRATORY_TRACT
  Filled 2020-02-19: qty 7

## 2020-02-19 MED ORDER — POLYETHYLENE GLYCOL 3350 17 G PO PACK: 17.0000 g | PACK | Freq: Every day | ORAL | Status: DC | PRN

## 2020-02-19 MED ORDER — LACTATED RINGERS IV SOLN: INTRAVENOUS | Status: AC

## 2020-02-19 MED ORDER — LORAZEPAM 0.5 MG PO TABS: 0.5000 mg | ORAL_TABLET | Freq: Four times a day (QID) | ORAL | Status: DC | PRN

## 2020-02-19 MED ORDER — POLYVINYL ALCOHOL 1.4 % OP SOLN
1.0000 [drp] | Freq: Every day | OPHTHALMIC | Status: DC
  Administered 2020-02-21 – 2020-02-22 (×3): 1 [drp] via OPHTHALMIC
  Filled 2020-02-19: qty 15

## 2020-02-19 MED ORDER — SODIUM CHLORIDE 0.9 % IV SOLN
2.0000 g | Freq: Once | INTRAVENOUS | Status: AC
  Administered 2020-02-19: 2 g via INTRAVENOUS
  Filled 2020-02-19: qty 2

## 2020-02-19 MED ORDER — ENOXAPARIN SODIUM 40 MG/0.4ML ~~LOC~~ SOLN
40.0000 mg | Freq: Every day | SUBCUTANEOUS | Status: DC
  Administered 2020-02-19 – 2020-02-22 (×4): 40 mg via SUBCUTANEOUS
  Filled 2020-02-19 (×4): qty 0.4

## 2020-02-19 MED ORDER — IPRATROPIUM-ALBUTEROL 0.5-2.5 (3) MG/3ML IN SOLN: 3.0000 mL | RESPIRATORY_TRACT | Status: DC | PRN

## 2020-02-19 MED ORDER — VANCOMYCIN HCL IN DEXTROSE 1-5 GM/200ML-% IV SOLN
1000.0000 mg | Freq: Once | INTRAVENOUS | Status: AC
  Administered 2020-02-19: 1000 mg via INTRAVENOUS
  Filled 2020-02-19: qty 200

## 2020-02-19 MED ORDER — FLUTICASONE FUROATE-VILANTEROL 100-25 MCG/INH IN AEPB
1.0000 | INHALATION_SPRAY | Freq: Every day | RESPIRATORY_TRACT | Status: DC
  Administered 2020-02-20 – 2020-02-23 (×4): 1 via RESPIRATORY_TRACT
  Filled 2020-02-19: qty 28

## 2020-02-19 MED ORDER — METOPROLOL TARTRATE 25 MG PO TABS
25.0000 mg | ORAL_TABLET | Freq: Two times a day (BID) | ORAL | Status: DC
  Administered 2020-02-20 – 2020-02-21 (×5): 25 mg via ORAL
  Filled 2020-02-19 (×5): qty 1

## 2020-02-19 MED ORDER — SODIUM CHLORIDE 0.9 % IV SOLN
2.0000 g | Freq: Two times a day (BID) | INTRAVENOUS | Status: DC
  Administered 2020-02-20: 2 g via INTRAVENOUS
  Filled 2020-02-19: qty 2

## 2020-02-19 MED ORDER — MORPHINE SULFATE 15 MG PO TABS: 15.0000 mg | ORAL_TABLET | ORAL | Status: DC | PRN

## 2020-02-19 MED ORDER — VANCOMYCIN HCL 500 MG/100ML IV SOLN
500.0000 mg | Freq: Two times a day (BID) | INTRAVENOUS | Status: DC
  Administered 2020-02-20: 500 mg via INTRAVENOUS
  Filled 2020-02-19 (×2): qty 100

## 2020-02-19 MED ORDER — SODIUM CHLORIDE 0.9 % IV SOLN
INTRAVENOUS | Status: DC | PRN
  Administered 2020-02-19: 500 mL via INTRAVENOUS

## 2020-02-19 MED ORDER — ONDANSETRON HCL 4 MG PO TABS: 4.0000 mg | ORAL_TABLET | Freq: Four times a day (QID) | ORAL | Status: DC | PRN

## 2020-02-19 MED ORDER — FENTANYL 50 MCG/HR TD PT72
1.0000 | MEDICATED_PATCH | TRANSDERMAL | Status: DC
  Administered 2020-02-20: 1 via TRANSDERMAL
  Filled 2020-02-19: qty 1

## 2020-02-19 MED ORDER — CLOPIDOGREL BISULFATE 75 MG PO TABS
75.0000 mg | ORAL_TABLET | Freq: Every day | ORAL | Status: DC
  Administered 2020-02-20 – 2020-02-23 (×4): 75 mg via ORAL
  Filled 2020-02-19 (×4): qty 1

## 2020-02-19 MED ORDER — UMECLIDINIUM BROMIDE 62.5 MCG/INH IN AEPB: 1.0000 | INHALATION_SPRAY | Freq: Every day | RESPIRATORY_TRACT | Status: DC

## 2020-02-19 MED ORDER — NICOTINE 7 MG/24HR TD PT24
7.0000 mg | MEDICATED_PATCH | Freq: Every day | TRANSDERMAL | Status: DC
  Administered 2020-02-21: 7 mg via TRANSDERMAL
  Filled 2020-02-19 (×3): qty 1

## 2020-02-19 MED ORDER — ACETAMINOPHEN 650 MG RE SUPP: 650.0000 mg | Freq: Four times a day (QID) | RECTAL | Status: DC | PRN

## 2020-02-19 MED ORDER — SODIUM CHLORIDE 0.9 % IV BOLUS
1000.0000 mL | Freq: Once | INTRAVENOUS | Status: AC
  Administered 2020-02-19: 1000 mL via INTRAVENOUS

## 2020-02-19 MED ORDER — DULOXETINE HCL 30 MG PO CPEP
30.0000 mg | ORAL_CAPSULE | Freq: Every day | ORAL | Status: DC
  Administered 2020-02-20 – 2020-02-23 (×4): 30 mg via ORAL
  Filled 2020-02-19 (×4): qty 1

## 2020-02-19 MED ORDER — ATORVASTATIN CALCIUM 40 MG PO TABS
80.0000 mg | ORAL_TABLET | Freq: Every day | ORAL | Status: DC
  Administered 2020-02-20 – 2020-02-22 (×3): 80 mg via ORAL
  Filled 2020-02-19 (×3): qty 2

## 2020-02-19 MED ORDER — ACETAMINOPHEN 325 MG PO TABS
650.0000 mg | ORAL_TABLET | Freq: Four times a day (QID) | ORAL | Status: DC | PRN
  Administered 2020-02-20 (×3): 650 mg via ORAL
  Filled 2020-02-19 (×3): qty 2

## 2020-02-19 MED ORDER — FLUTICASONE-UMECLIDIN-VILANT 100-62.5-25 MCG/INH IN AEPB: 1.0000 | INHALATION_SPRAY | Freq: Every day | RESPIRATORY_TRACT | Status: DC

## 2020-02-19 MED ORDER — GLYCERIN-HYPROMELLOSE-PEG 400 0.2-0.2-1 % OP SOLN: 1.0000 [drp] | Freq: Every day | OPHTHALMIC | Status: DC

## 2020-02-19 MED ORDER — ENSURE ENLIVE PO LIQD
237.0000 mL | Freq: Two times a day (BID) | ORAL | Status: DC
  Administered 2020-02-20 – 2020-02-22 (×4): 237 mL via ORAL

## 2020-02-19 MED ORDER — ONDANSETRON HCL 4 MG/2ML IJ SOLN: 4.0000 mg | Freq: Four times a day (QID) | INTRAMUSCULAR | Status: DC | PRN

## 2020-02-19 MED ORDER — SODIUM CHLORIDE 0.9 % IV SOLN
INTRAVENOUS | Status: DC | PRN
  Administered 2020-02-19: 250 mL via INTRAVENOUS

## 2020-02-19 MED ORDER — CALCIUM CARBONATE ANTACID 500 MG PO CHEW
1.0000 | CHEWABLE_TABLET | Freq: Three times a day (TID) | ORAL | Status: DC
  Administered 2020-02-20 – 2020-02-23 (×9): 200 mg via ORAL
  Filled 2020-02-19 (×11): qty 1

## 2020-02-19 MED ORDER — DRONABINOL 5 MG PO CAPS
5.0000 mg | ORAL_CAPSULE | Freq: Two times a day (BID) | ORAL | Status: DC
  Administered 2020-02-20 – 2020-02-23 (×7): 5 mg via ORAL
  Filled 2020-02-19 (×7): qty 1

## 2020-02-19 NOTE — ED Notes (Signed)
Patient transported to X-ray 

## 2020-02-19 NOTE — ED Notes (Signed)
Attempted to call report; this RN contact info provided for callback 

## 2020-02-19 NOTE — ED Triage Notes (Signed)
Fever and body aches since yesterday. Currently receives immunotherapy. Denies cough, N/V/D

## 2020-02-19 NOTE — Progress Notes (Signed)
Pharmacy Antibiotic Note  Kyle Foster is a 74 y.o. male admitted on 02/19/2020 with sepsis.  Pharmacy has been consulted for vancomycin and cefepime dosing. Pt is febrile with Tmax of 101.8 and WBC is WNL. SCr is WNL and lactic acid is <2.   Plan: Vancomycin 1gm IV x 1 then 500mg  IV Q12H Cefepime 2gm IV Q12H F/u renal fxn, C&S, clinical status and trough at SS  Height: 5\' 11"  (180.3 cm) Weight: 55.3 kg (122 lb) IBW/kg (Calculated) : 75.3  Temp (24hrs), Avg:101.9 F (38.8 C), Min:101.9 F (38.8 C), Max:101.9 F (38.8 C)  Recent Labs  Lab 02/19/20 1603  WBC 10.1  CREATININE 1.09  LATICACIDVEN 1.5    Estimated Creatinine Clearance: 47.9 mL/min (by C-G formula based on SCr of 1.09 mg/dL).    Allergies  Allergen Reactions  . Codeine Other (See Comments)    HEADACHE  headaches    Antimicrobials this admission: Vanc 6/13>> Cefepime 6/13>> Flagyl x 1 6/13  Dose adjustments this admission: N/A  Microbiology results: Pending  Thank you for allowing pharmacy to be a part of this patient's care.  Jahzara Slattery, Rande Lawman 02/19/2020 5:05 PM
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
## 2020-02-19 NOTE — ED Notes (Signed)
[facility] notified Kyle Foster) -patient ready for transport

## 2020-02-19 NOTE — ED Provider Notes (Signed)
Emergency Department Provider Note   I have reviewed the triage vital signs and the nursing notes.   HISTORY  Chief Complaint Fever   HPI Kyle Foster is a 74 y.o. 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 [name] currently on immunotherapy presents to the emergency department with fever with body aches and fatigue.  Symptoms began yesterday.  He has had T-max at home of 103.  Today he had shaking chills with continued fatigue.  He last took Tylenol at 11 AM but continued to feel symptoms so presents to the emergency department.  He has not had nausea, vomiting, diarrhea.  No abdominal pain, back pain, chest pain.  No upper respiratory infection symptoms.  Past Medical History:  Diagnosis Date  . Allergy   . 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 CAD (coronary artery disease)   . 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 Depression   . GERD (gastroesophageal reflux disease)   . History of chickenpox   . History of kidney stones   . Hyperlipidemia   . Hypertension   . Myocardial infarction ([place]) 06/2008  . Recurrent upper respiratory infection (URI)    09/01/11 saw PCP - Kathryne Eriksson , antibiotic  and prednisone   . Skin cancer    "cut & burned off arms, hands, face, neck" (06/14/2018)  . 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 immunotherapy    Patient Active Problem List   Diagnosis Date Noted  . Left lower lobe pneumonia 02/20/2020  . Pulmonary infarct ([place]) 02/19/2020  . Aspiration pneumonia of left lower lobe due to gastric secretions ([place]) 02/19/2020  . 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 GERD without esophagitis 02/19/2020  . COPD with chronic bronchitis and emphysema ([place]) 05/02/2019  . 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 H/O agent Orange exposure 03/22/2019  . Centrilobular emphysema ([place])  03/21/2019  . Squamous cell lung cancer, left ([place]) 11/24/2018  . Counseling regarding advance care planning and goals of care 11/24/2018  . Metastatic cancer ([place])   . Malignant neoplasm of upper lobe of left lung ([place])   . Bone metastases ([place])   . Severe protein-calorie malnutrition ([place]) 10/15/2018  . Palliative care by specialist   . Goals of care, counseling/discussion   . Cancer associated pain 10/14/2018  . CAD S/P percutaneous coronary angioplasty 07/13/2013  . Essential hypertension 07/13/2013  . Mixed hyperlipidemia 07/13/2013  . Tobacco abuse 07/13/2013  . Coronary artery disease 07/13/2013  . 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 CATARACT EXTRACTION W/ INTRAOCULAR LENS IMPLANT Right 12/2007  . CORONARY ANGIOPLASTY WITH STENT PLACEMENT  06/2008   "3 stents" (06/14/2018)  . CYSTOSCOPY W/ RETROGRADES  09/22/2011   Procedure: CYSTOSCOPY WITH RETROGRADE PYELOGRAM;  Surgeon: Bernestine Amass, MD;  Location: [facility];  Service: Urology;  Laterality: Left;  Cystoscopy left Retrograde Pyelogram      (c-arm)   . CYSTOSCOPY WITH BIOPSY  09/22/2011   Procedure: CYSTOSCOPY WITH BIOPSY;  Surgeon: Bernestine Amass, MD;  Location: [facility];  Service: Urology;  Laterality: N/A;   Biopsy  . EXCISIONAL HEMORRHOIDECTOMY  ~ 2006  . EYE SURGERY Left 04/2011   "reconstruction; gold  weight in eye lid " (06/14/2018)  . FOOT NEUROMA SURGERY Left   . INGUINAL HERNIA REPAIR Right 02/2018  . IR IMAGING GUIDED PORT INSERTION  11/23/2018  . NM MYOCAR PERF WALL MOTION  07/11/2008   MILD ISCHEMIA IN THE BASL INFERIOR, MID INFERIOR & APICAL INFERIOR REGIONS  . 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 SKIN CANCER EXCISION     "arms, hands, face, neck" (06/14/2018)  . TRANSURETHRAL RESECTION OF BLADDER TUMOR  WITH GYRUS (TURBT-GYRUS)  2010    Allergies Chlorhexidine and Codeine  Family History  Problem Relation Age of Onset  . Heart attack Mother 30  . Cancer Mother        Lung  . Diabetes Mother   . Heart disease Mother   . Hypertension Mother   . Stroke Father 5  . Cancer Father        Lung  . Brain cancer Brother   . Cancer Sister        Melanoma of great Toe  . Hyperlipidemia Sister     Social History Social History   Tobacco Use  . Smoking status: Current Every Day Smoker    Packs/day: 0.50    Years: 52.00    Pack years: 26.00    Types: Cigarettes    Start date: 66  . Smokeless tobacco: Never Used  . Tobacco comment: 03/21/19- smoking 10 cigs a day   Vaping Use  . Vaping Use: Never used  Substance Use Topics  . Alcohol use: Not Currently  . Drug use: Not Currently    Review of Systems  Constitutional: Positive fever/chills and body aches.  Eyes: No visual changes. ENT: No sore throat. Cardiovascular: Denies chest pain. Respiratory: Denies shortness of breath. Gastrointestinal: No abdominal pain.  No nausea, no vomiting.  No diarrhea.  No constipation. Genitourinary: Negative for dysuria. Musculoskeletal: Negative for back pain. Skin: Negative for rash. Neurological: Negative for headaches, focal weakness or numbness.  10-point ROS otherwise negative.  ____________________________________________   PHYSICAL EXAM:  VITAL SIGNS: ED Triage Vitals  Enc Vitals Group     BP 02/19/20 1521 139/71     Pulse Rate 02/19/20 1521 86     Resp 02/19/20 1521 (!) 24     Temp 02/19/20 1521 (!) 101.9 F (38.8 C)     Temp Source 02/19/20 1521 Oral     SpO2 02/19/20 1521 98 %     Weight 02/19/20 1520 122 lb (55.3 kg)     Height 02/19/20 1520 5\' 11"  (1.803 m)   Constitutional: Alert and oriented. Appears fatigued but no acute distress.  Eyes: Conjunctivae are normal.  Head: Atraumatic. Nose: No congestion/rhinnorhea. Mouth/Throat: Mucous membranes are moist.   Oropharynx non-erythematous. No exudate or PTA.  Neck: No stridor.   Cardiovascular: Normal rate, regular rhythm. Good peripheral circulation. Grossly normal heart sounds.   Respiratory: Normal respiratory effort.  No retractions. Lungs CTAB. Gastrointestinal: Soft and nontender. No distention.  Musculoskeletal: No gross deformities of extremities. Neurologic:  Normal speech and language. Skin:  Skin is warm, dry and intact. No rash noted.   ____________________________________________   LABS (all labs ordered are listed, but only abnormal results are displayed)  Labs Reviewed  URINALYSIS, ROUTINE W REFLEX MICROSCOPIC - Abnormal; Notable for the following components:      Result Value   Hgb urine dipstick LARGE (*)    Protein, ur 100 (*)    All other components  within normal limits  URINALYSIS, MICROSCOPIC (REFLEX) - Abnormal; Notable for the following components:   Bacteria, UA MANY (*)    All other components within normal limits  CBC - Abnormal; Notable for the following components:   RBC 4.04 (*)    Platelets 126 (*)    All other components within normal limits  COMPREHENSIVE METABOLIC PANEL - Abnormal; Notable for the following components:   Glucose, Bld 121 (*)    BUN 24 (*)    Calcium 8.8 (*)    All other components within normal limits  CBC WITH DIFFERENTIAL/PLATELET - Abnormal; Notable for the following components:   WBC 10.9 (*)    RBC 3.25 (*)    Hemoglobin 10.9 (*)    HCT 31.4 (*)    Platelets 116 (*)    Neutro Abs 9.8 (*)    Lymphs Abs 0.3 (*)    All other components within normal limits  COMPREHENSIVE METABOLIC PANEL - Abnormal; Notable for the following components:   Glucose, Bld 103 (*)    Calcium 7.8 (*)    Total Protein 5.7 (*)    Albumin 2.9 (*)    All other components within normal limits  CBC WITH DIFFERENTIAL/PLATELET - Abnormal; Notable for the following components:   RBC 3.50 (*)    Hemoglobin 11.5 (*)    HCT 33.8 (*)    Platelets 124 (*)      Neutro Abs 8.3 (*)    Lymphs Abs 0.2 (*)    All other components within normal limits  COMPREHENSIVE METABOLIC PANEL - Abnormal; Notable for the following components:   Sodium 133 (*)    Potassium 3.3 (*)    Glucose, Bld 107 (*)    Calcium 7.7 (*)    Total Protein 5.7 (*)    Albumin 2.7 (*)    Alkaline Phosphatase 37 (*)    All other components within normal limits  CULTURE, BLOOD (ROUTINE X 2)  CULTURE, BLOOD (ROUTINE X 2)  URINE CULTURE  SARS CORONAVIRUS 2 BY RT PCR (HOSPITAL ORDER, [facility])  EXPECTORATED SPUTUM ASSESSMENT W REFEX TO RESP CULTURE  MRSA PCR SCREENING  EXPECTORATED SPUTUM ASSESSMENT W REFEX TO RESP CULTURE  LACTIC ACID, PLASMA  PROTIME-INR  HIV ANTIBODY (ROUTINE TESTING W REFLEX)  STREP PNEUMONIAE URINARY ANTIGEN  MAGNESIUM  MAGNESIUM  LACTIC ACID, PLASMA  CBC WITH DIFFERENTIAL/PLATELET  LEGIONELLA PNEUMOPHILA SEROGP 1 UR AG   ____________________________________________  EKG  None ____________________________________________  RADIOLOGY  CXR and CT imaging reviewed.  ____________________________________________   PROCEDURES  Procedure(s) performed:   Procedures  CRITICAL CARE Performed by: Margette Fast Total critical care time: 35 minutes Critical care time was exclusive of separately billable procedures and treating other patients. Critical care was necessary to treat or prevent imminent or life-threatening deterioration. Critical care was time spent personally by me on the following activities: development of treatment plan with patient and/or surrogate as well as nursing, discussions with consultants, evaluation of patient's response to treatment, examination of patient, obtaining history from patient or surrogate, ordering and performing treatments and interventions, ordering and review of laboratory studies, ordering and review of radiographic studies, pulse oximetry and re-evaluation of patient's  condition.  Nanda Quinton, MD Emergency Medicine  ____________________________________________   INITIAL IMPRESSION / ASSESSMENT AND PLAN / ED COURSE  Pertinent labs & imaging results that were available during my care of the patient were reviewed by me and considered in my medical decision making (see chart for details).  Patient presents to the emergency department mainly with constitutional type symptoms over the past 2 days.  No clear infection source.  He is on immunotherapy for his stage IV lung cancer.  Sepsis work-up initiated including blood and urine cultures.  Abdomen is diffusely soft and nontender.  He is in no respiratory distress.  No hypoxemia.  Normal heart rate and blood pressure.   Labs and imaging reviewed. Question PNA found on CT renal performed with hematuria on UA. No UTI. Concern for developing sepsis in immunocompromised patient. Plan for admit.   Discussed patient's case with [facility] to request admission. Patient and family (if present) updated with plan. Care transferred to [facility] service.  I reviewed all nursing notes, vitals, pertinent old records, EKGs, labs, imaging (as available).  ____________________________________________  FINAL CLINICAL IMPRESSION(S) / ED DIAGNOSES  Final diagnoses:  Community acquired pneumonia of left lower lobe of lung     MEDICATIONS GIVEN DURING THIS VISIT:  Medications  0.9 %  sodium chloride infusion (250 mLs Intravenous New Bag/Given 02/19/20 1715)  0.9 %  sodium chloride infusion (500 mLs Intravenous New Bag/Given 02/19/20 1934)  lactated ringers infusion (0 mLs Intravenous Stopped 02/20/20 1541)  ipratropium-albuterol (DUONEB) 0.5-2.5 (3) MG/3ML nebulizer solution 3 mL (has no administration in time range)  feeding supplement (ENSURE ENLIVE) (ENSURE ENLIVE) liquid 237 mL (237 mLs Oral Given 02/21/20 1350)  nicotine (NICODERM CQ - dosed in mg/24 hr) patch 7 mg (7 mg Transdermal Patch Applied 02/21/20 1008)  fentaNYL (DURAGESIC)  50 MCG/HR 1 patch (1 patch Transdermal Patch Applied 02/20/20 2256)  morphine (MSIR) tablet 15-30 mg (has no administration in time range)  atorvastatin (LIPITOR) tablet 80 mg (80 mg Oral Given 02/21/20 1805)  metoprolol tartrate (LOPRESSOR) tablet 25 mg (25 mg Oral Given 02/21/20 1009)  DULoxetine (CYMBALTA) DR capsule 30 mg (30 mg Oral Given 02/21/20 1009)  LORazepam (ATIVAN) tablet 0.5 mg (has no administration in time range)  calcium carbonate (TUMS - dosed in mg elemental calcium) chewable tablet 200 mg of elemental calcium (200 mg of elemental calcium Oral Given 02/21/20 1805)  dronabinol (MARINOL) capsule 5 mg (5 mg Oral Given 02/21/20 1805)  pantoprazole (PROTONIX) EC tablet 40 mg (40 mg Oral Given 02/21/20 1009)  clopidogrel (PLAVIX) tablet 75 mg (75 mg Oral Given 02/21/20 1009)  polyethylene glycol (MIRALAX / GLYCOLAX) packet 17 g (has no administration in time range)  ondansetron (ZOFRAN) tablet 4 mg (has no administration in time range)    Or  ondansetron (ZOFRAN) injection 4 mg (has no administration in time range)  enoxaparin (LOVENOX) injection 40 mg (40 mg Subcutaneous Given 02/20/20 2255)  acetaminophen (TYLENOL) tablet 650 mg (650 mg Oral Given 02/20/20 2226)    Or  acetaminophen (TYLENOL) suppository 650 mg ( Rectal See Alternative 02/20/20 2226)  polyvinyl alcohol (LIQUIFILM TEARS) 1.4 % ophthalmic solution 1 drop (1 drop Both Eyes Given 02/21/20 0033)  fluticasone furoate-vilanterol (BREO ELLIPTA) 100-25 MCG/INH 1 puff (1 puff Inhalation Given 02/21/20 0802)    And  umeclidinium bromide (INCRUSE ELLIPTA) 62.5 MCG/INH 1 puff (1 puff Inhalation Given 02/21/20 0804)  ceFEPIme (MAXIPIME) 2 g in sodium chloride 0.9 % 100 mL IVPB (2 g Intravenous New Bag/Given 02/21/20 1338)  potassium chloride SA (KLOR-CON) CR tablet 20 mEq (20 mEq Oral Given 02/21/20 1009)  loperamide HCl (IMODIUM) 1 MG/7.5ML suspension 2 mg (2 mg Oral Given 02/21/20 1337)  acetaminophen (TYLENOL) tablet 1,000 mg (1,000 mg  Oral Given 02/19/20 1637)  sodium chloride 0.9 % bolus 1,000 mL (  Intravenous Stopped 02/19/20 1751)  ceFEPIme (MAXIPIME) 2 g in sodium chloride 0.9 % 100 mL IVPB ( Intravenous Stopped 02/19/20 1747)  metroNIDAZOLE (FLAGYL) IVPB 500 mg (500 mg Intravenous New Bag/Given 02/19/20 1935)  vancomycin (VANCOCIN) IVPB 1000 mg/200 mL premix ( Intravenous Stopped 02/19/20 1805)  sodium chloride (PF) 0.9 % injection (  Given by Other 02/20/20 0134)  iohexol (OMNIPAQUE) 350 MG/ML injection 100 mL (100 mLs Intravenous Contrast Given 02/20/20 0149)    Note:  This document was prepared using Dragon voice recognition software and may include unintentional dictation errors.  Nanda Quinton, MD, [facility] Emergency Medicine    Davi Kroon, [facility], MD 02/21/20 Kathyrn Drown

## 2020-02-19 NOTE — ED Notes (Signed)
IV pump malfunction noted r/t Metronidazole infusion; primary line was infusing and Metronidazole bag was still full; Metronidazole changed to primary line and pump manually programmed for infusion.

## 2020-02-20 ENCOUNTER — Observation Stay (HOSPITAL_COMMUNITY): Payer: Medicare Other

## 2020-02-20 ENCOUNTER — Ambulatory Visit (HOSPITAL_COMMUNITY): Admission: RE | Admit: 2020-02-20 | Payer: No Typology Code available for payment source | Source: Ambulatory Visit

## 2020-02-20 ENCOUNTER — Encounter (HOSPITAL_COMMUNITY): Payer: Self-pay | Admitting: Internal Medicine

## 2020-02-20 DIAGNOSIS — Z20822 Contact with and (suspected) exposure to covid-19: Secondary | ICD-10-CM | POA: Diagnosis present

## 2020-02-20 DIAGNOSIS — E43 Unspecified severe protein-calorie malnutrition: Secondary | ICD-10-CM | POA: Diagnosis present

## 2020-02-20 DIAGNOSIS — J690 Pneumonitis due to inhalation of food and vomit: Secondary | ICD-10-CM | POA: Diagnosis not present

## 2020-02-20 DIAGNOSIS — C3492 Malignant neoplasm of unspecified part of left bronchus or lung: Secondary | ICD-10-CM | POA: Diagnosis present

## 2020-02-20 DIAGNOSIS — J449 Chronic obstructive pulmonary disease, unspecified: Secondary | ICD-10-CM | POA: Diagnosis not present

## 2020-02-20 DIAGNOSIS — I10 Essential (primary) hypertension: Secondary | ICD-10-CM | POA: Diagnosis present

## 2020-02-20 DIAGNOSIS — J189 Pneumonia, unspecified organism: Secondary | ICD-10-CM | POA: Diagnosis present

## 2020-02-20 DIAGNOSIS — A419 Sepsis, unspecified organism: Secondary | ICD-10-CM | POA: Diagnosis present

## 2020-02-20 DIAGNOSIS — R531 Weakness: Secondary | ICD-10-CM

## 2020-02-20 DIAGNOSIS — F17210 Nicotine dependence, cigarettes, uncomplicated: Secondary | ICD-10-CM | POA: Diagnosis present

## 2020-02-20 DIAGNOSIS — K219 Gastro-esophageal reflux disease without esophagitis: Secondary | ICD-10-CM | POA: Diagnosis present

## 2020-02-20 DIAGNOSIS — Z955 Presence of coronary angioplasty implant and graft: Secondary | ICD-10-CM | POA: Diagnosis not present

## 2020-02-20 DIAGNOSIS — J42 Unspecified chronic bronchitis: Secondary | ICD-10-CM | POA: Diagnosis present

## 2020-02-20 DIAGNOSIS — Z8551 Personal history of malignant neoplasm of bladder: Secondary | ICD-10-CM | POA: Diagnosis not present

## 2020-02-20 DIAGNOSIS — Z85828 Personal history of other malignant neoplasm of skin: Secondary | ICD-10-CM | POA: Diagnosis not present

## 2020-02-20 DIAGNOSIS — I2510 Atherosclerotic heart disease of native coronary artery without angina pectoris: Secondary | ICD-10-CM | POA: Diagnosis present

## 2020-02-20 DIAGNOSIS — C7951 Secondary malignant neoplasm of bone: Secondary | ICD-10-CM | POA: Diagnosis present

## 2020-02-20 DIAGNOSIS — Z9221 Personal history of antineoplastic chemotherapy: Secondary | ICD-10-CM | POA: Diagnosis not present

## 2020-02-20 DIAGNOSIS — Z681 Body mass index (BMI) 19 or less, adult: Secondary | ICD-10-CM | POA: Diagnosis not present

## 2020-02-20 DIAGNOSIS — D649 Anemia, unspecified: Secondary | ICD-10-CM

## 2020-02-20 DIAGNOSIS — E782 Mixed hyperlipidemia: Secondary | ICD-10-CM | POA: Diagnosis present

## 2020-02-20 DIAGNOSIS — Z923 Personal history of irradiation: Secondary | ICD-10-CM | POA: Diagnosis not present

## 2020-02-20 DIAGNOSIS — I252 Old myocardial infarction: Secondary | ICD-10-CM | POA: Diagnosis not present

## 2020-02-20 DIAGNOSIS — J439 Emphysema, unspecified: Secondary | ICD-10-CM | POA: Diagnosis present

## 2020-02-20 DIAGNOSIS — I2699 Other pulmonary embolism without acute cor pulmonale: Secondary | ICD-10-CM | POA: Diagnosis present

## 2020-02-20 DIAGNOSIS — R3129 Other microscopic hematuria: Secondary | ICD-10-CM | POA: Diagnosis present

## 2020-02-20 HISTORY — DX: Pneumonia, unspecified organism: J18.9

## 2020-02-20 LAB — COMPREHENSIVE METABOLIC PANEL
ALT: 20 U/L (ref 0–44)
AST: 22 U/L (ref 15–41)
Albumin: 2.9 g/dL — ABNORMAL LOW (ref 3.5–5.0)
Alkaline Phosphatase: 40 U/L (ref 38–126)
Anion gap: 10 (ref 5–15)
BUN: 19 mg/dL (ref 8–23)
CO2: 24 mmol/L (ref 22–32)
Calcium: 7.8 mg/dL — ABNORMAL LOW (ref 8.9–10.3)
Chloride: 102 mmol/L (ref 98–111)
Creatinine, Ser: 0.85 mg/dL (ref 0.61–1.24)
GFR calc Af Amer: >60 mL/min (ref >60)
GFR calc non Af Amer: >60 mL/min (ref >60)
Glucose, Bld: 103 mg/dL — ABNORMAL HIGH (ref 70–99)
Potassium: 3.5 mmol/L (ref 3.5–5.1)
Sodium: 136 mmol/L (ref 135–145)
Total Bilirubin: 0.7 mg/dL (ref 0.3–1.2)
Total Protein: 5.7 g/dL — ABNORMAL LOW (ref 6.5–8.1)

## 2020-02-20 LAB — MRSA PCR SCREENING: MRSA by PCR: NEGATIVE

## 2020-02-20 LAB — CBC WITH DIFFERENTIAL/PLATELET
Abs Immature Granulocytes: 0.06 10*3/uL (ref 0.00–0.07)
Basophils Absolute: 0 10*3/uL (ref 0.0–0.1)
Basophils Relative: 0 %
Eosinophils Absolute: 0 10*3/uL (ref 0.0–0.5)
Eosinophils Relative: 0 %
HCT: 31.4 % — ABNORMAL LOW (ref 39.0–52.0)
Hemoglobin: 10.9 g/dL — ABNORMAL LOW (ref 13.0–17.0)
Immature Granulocytes: 1 %
Lymphocytes Relative: 2 %
Lymphs Abs: 0.3 10*3/uL — ABNORMAL LOW (ref 0.7–4.0)
MCH: 33.5 pg (ref 26.0–34.0)
MCHC: 34.7 g/dL (ref 30.0–36.0)
MCV: 96.6 fL (ref 80.0–100.0)
Monocytes Absolute: 0.8 10*3/uL (ref 0.1–1.0)
Monocytes Relative: 8 %
Neutro Abs: 9.8 10*3/uL — ABNORMAL HIGH (ref 1.7–7.7)
Neutrophils Relative %: 89 %
Platelets: 116 10*3/uL — ABNORMAL LOW (ref 150–400)
RBC: 3.25 MIL/uL — ABNORMAL LOW (ref 4.22–5.81)
RDW: 13.2 % (ref 11.5–15.5)
WBC: 10.9 10*3/uL — ABNORMAL HIGH (ref 4.0–10.5)
nRBC: 0 % (ref 0.0–0.2)

## 2020-02-20 LAB — EXPECTORATED SPUTUM ASSESSMENT W GRAM STAIN, RFLX TO RESP C

## 2020-02-20 LAB — HIV ANTIBODY (ROUTINE TESTING W REFLEX): HIV Screen 4th Generation wRfx: NONREACTIVE

## 2020-02-20 LAB — MAGNESIUM: Magnesium: 1.8 mg/dL (ref 1.7–2.4)

## 2020-02-20 LAB — STREP PNEUMONIAE URINARY ANTIGEN: Strep Pneumo Urinary Antigen: NEGATIVE

## 2020-02-20 IMAGING — CT CT CHEST W/O CM
2 of 4 series · 15 of 36 positions shown, 18 images · non-contrast
Comparison: 05/30/2019

CLINICAL DATA: Squamous cell carcinoma of the lung with metastatic
disease to the spine. Immunotherapy in progress. Status post XRT.

EXAM:
CT CHEST WITHOUT CONTRAST
TECHNIQUE: Multidetector CT imaging of the chest was performed following the
standard protocol without IV contrast.

[Series 2: thorax · axial · 0.74mm/px · z∈[+1268,+1534]mm · 12 of 159 slices shown, 15 images]
[im 13/159  mediastinal]
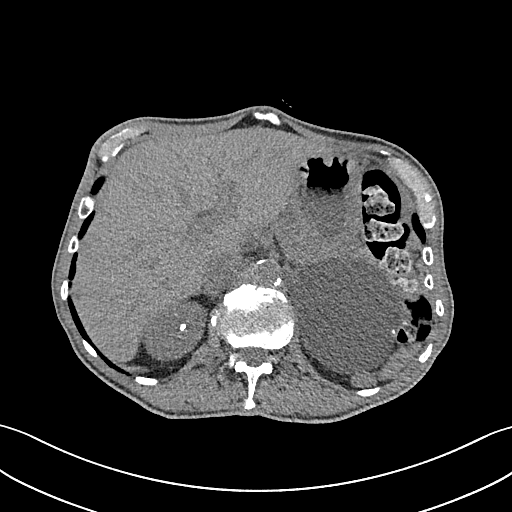
[im 13/159  lung]
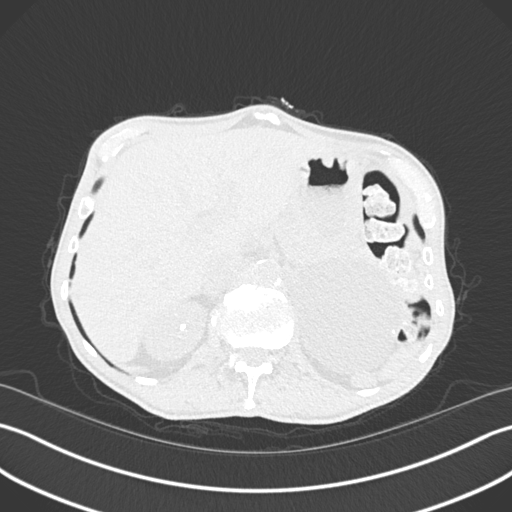
[im 25/159  lung]
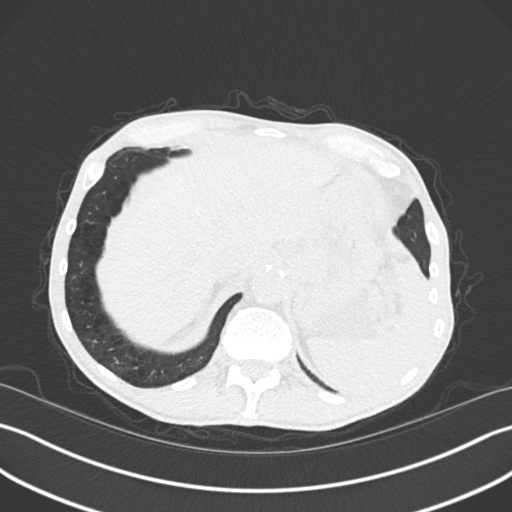
[im 37/159  lung]
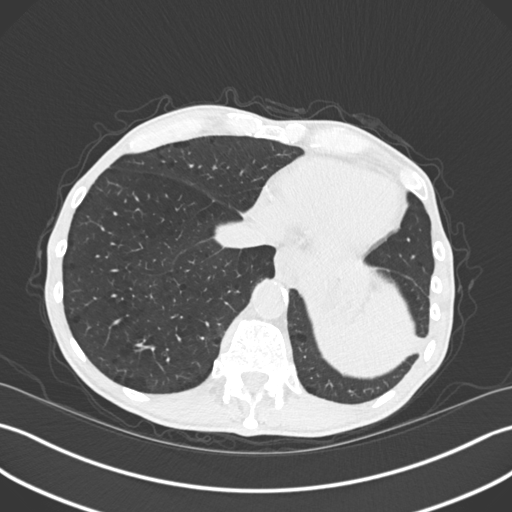
[im 49/159  lung]
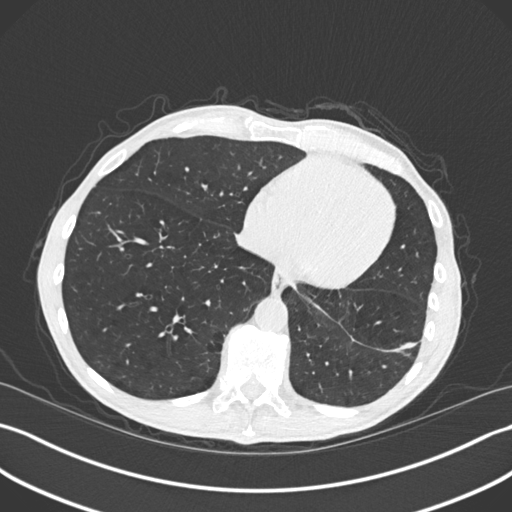
[im 61/159  mediastinal]
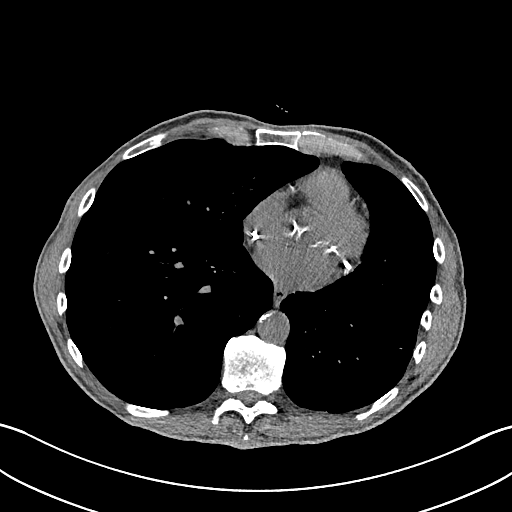
[im 61/159  lung]
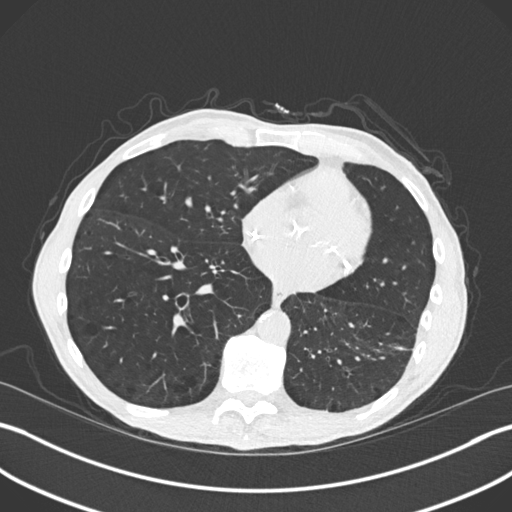
[im 73/159  lung]
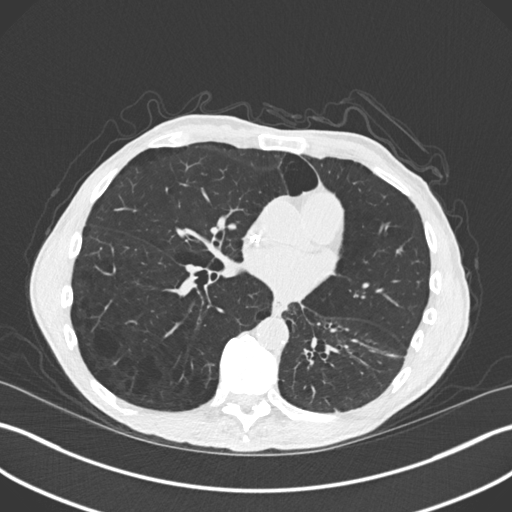
[im 86/159  lung]
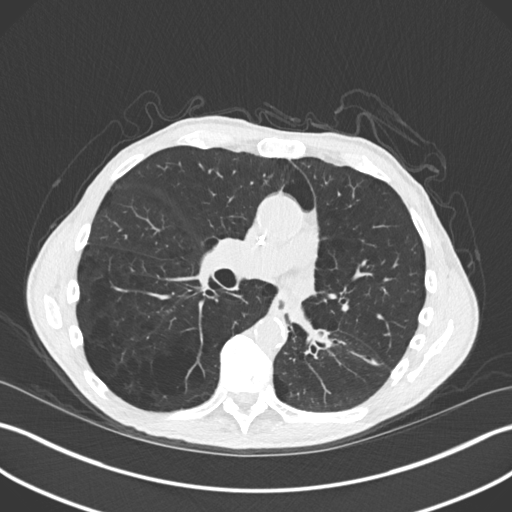
[im 98/159  lung]
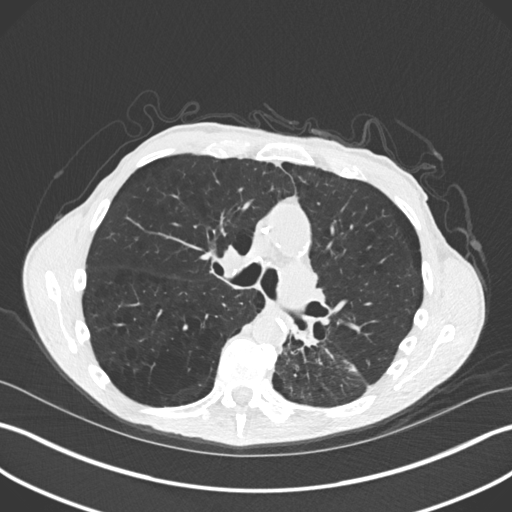
[im 110/159  mediastinal]
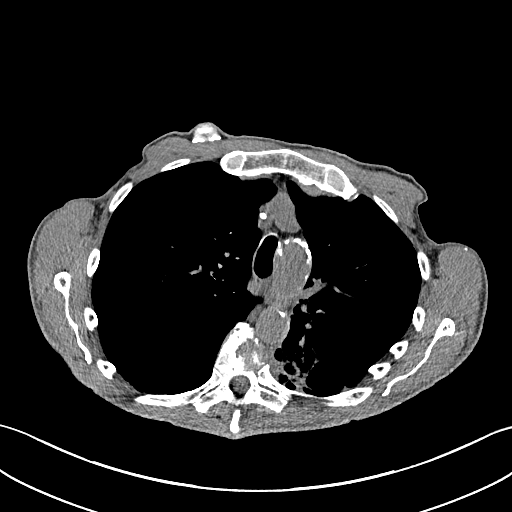
[im 110/159  lung]
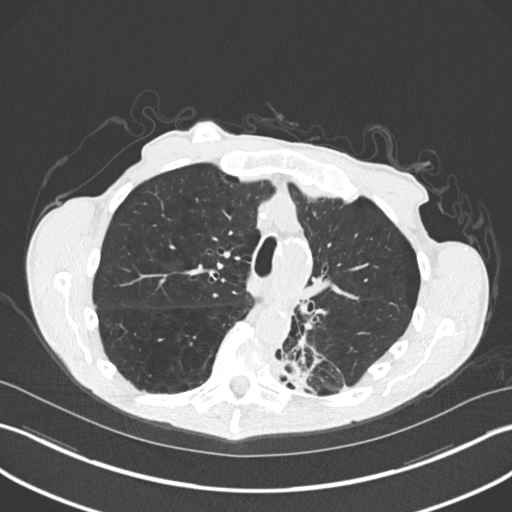
[im 122/159  lung]
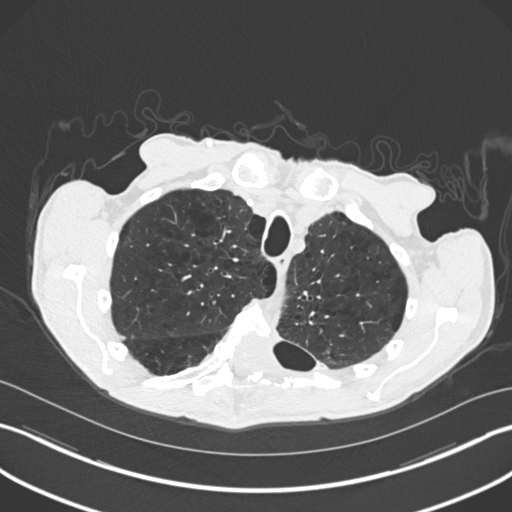
[im 134/159  lung]
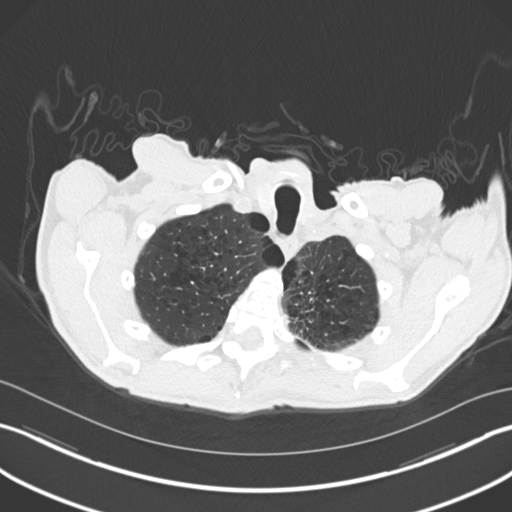
[im 146/159  lung]
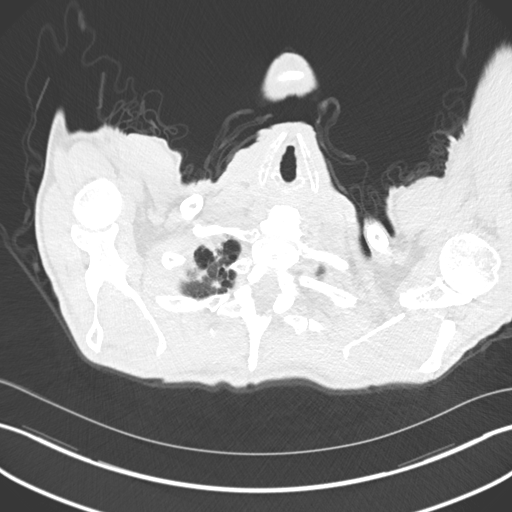

[Series 5: coronal · coronal · 0.62mm/px · 3 of 151 slices shown]
[im 31/151  lung]
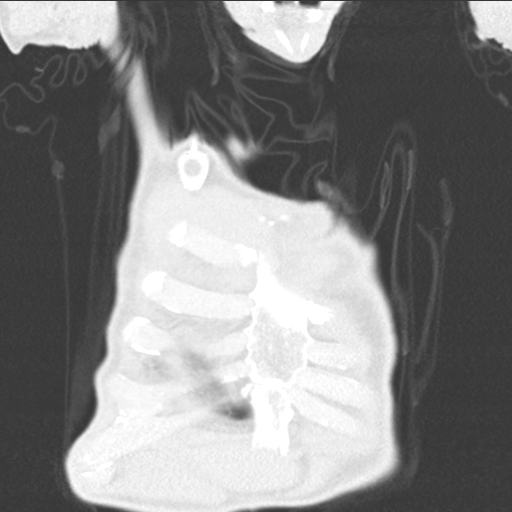
[im 61/151  lung]
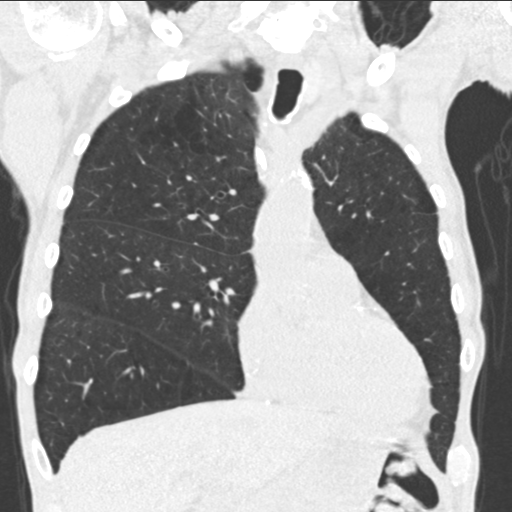
[im 91/151  lung]
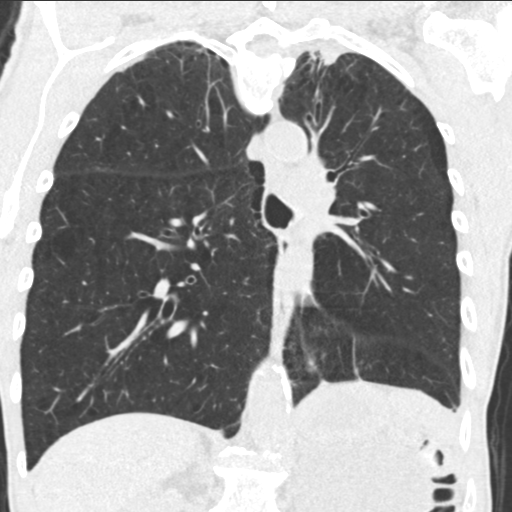

[15 of 36 positions shown; findings below may reference images not displayed]

FINDINGS: Cardiovascular: The heart size appears within normal limits. No
pericardial effusion identified. Aortic atherosclerosis. No
aneurysm. Three vessel coronary artery atherosclerotic
calcifications.

Mediastinum/Nodes: Normal appearance of the thyroid gland. The
trachea is patent and appears midline. Normal appearance of the
esophagus. No adenopathy.

Lungs/Pleura: Paraseptal and centrilobular emphysema. Large cystic
and solid lesion invading the vertebral column and posterior left
ribs is again noted. On today's study this measures 6.6 by 3.8 by
3.9 cm (volume = 51 cm^3), image 39/7. Previously this measured
6.3 by 4.3 by 3.6 cm (volume = 51 cm^3). There is surrounding
paramediastinal radiation change within the superior segment of left
lower lobe. Within the posterior apex of the left lung there is a
2.5 x 1.9 cm triangular-shaped subpleural density, image [DATE].
Unchanged from previous exam. This may be related to post treatment
change no new lung nodules identified.

Upper Abdomen: Stone within upper pole of right kidney is again
noted. Large left kidney cyst is again identified. No acute
findings.

Musculoskeletal: Lytic lesions involving T5 and T6 vertebral bodies
as described above. There is involvement of the corresponding left
transverse processes and pedicles of these vertebra. Erosive changes
are also noted involving the left fifth and sixth ribs, similar to
previous exam.
IMPRESSION: 1. Stable size of cystic and solid lesion invading the vertebral
column and posterior left ribs.
2. Stable appearance of paramediastinal radiation change within the
superior segment of left lower lobe.
3. Stable appearance of triangular-shaped subpleural density within
the posterior apex of left lung which may be related to post
treatment change.
4. Aortic atherosclerosis and 3 vessel coronary artery
atherosclerotic calcifications.

Aortic Atherosclerosis (OP1IS-62C.C) and Emphysema (OP1IS-UT7.K).

## 2020-02-20 MED ORDER — SODIUM CHLORIDE 0.9 % IV SOLN
2.0000 g | Freq: Three times a day (TID) | INTRAVENOUS | Status: DC
  Administered 2020-02-20 – 2020-02-23 (×9): 2 g via INTRAVENOUS
  Filled 2020-02-20 (×10): qty 2

## 2020-02-20 MED ORDER — IOHEXOL 350 MG/ML SOLN
100.0000 mL | Freq: Once | INTRAVENOUS | Status: AC | PRN
  Administered 2020-02-20: 100 mL via INTRAVENOUS

## 2020-02-20 MED ORDER — SODIUM CHLORIDE (PF) 0.9 % IJ SOLN
INTRAMUSCULAR | Status: AC
  Filled 2020-02-20: qty 50

## 2020-02-20 NOTE — Progress Notes (Signed)
Patient's with elevated temp 102, tylenol given, patient denies any distress. Dr. Cyd Silence and charge nurse notified, yellow MEWs protocol initiated.

## 2020-02-20 NOTE — Progress Notes (Signed)
Initial Nutrition Assessment  DOCUMENTATION CODES:   Severe malnutrition in context of chronic illness, Underweight  INTERVENTION:   -Ensure Enlive po BID, each supplement provides 350 kcal and 20 grams of protein -[employer] Breakfast with breakfast tray, each provides 220 kcals and 13g protein  NUTRITION DIAGNOSIS:   Severe Malnutrition related to chronic illness, cancer and cancer related treatments as evidenced by energy intake < or equal to 75% for > or equal to 1 month, severe fat depletion, severe muscle depletion.  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  Patient in room with wife at bedside. Both report that pt has had poor appetite for a while now and isn't eating much at home. Pt with flat affect through most of conversation. Pt states he has not eaten anything today. Per documentation, pt had peaches with SLP evaluation.   Encouraged pt to order some dinner and that family can bring in food if he prefers.   Pt with Ensure supplements ordered. Pt states "they have a sticky taste". Encouraged pt to try supplements over ice or mixed with plain milk to lessen the "sticky" taste. Per Leland RD notes, pt was drinking [employer] drinks. When offered to  order CIB for patient, he was very indifferent. Will order CIB with breakfast only. Pt denies any swallowing issues. Per SLP note, pt may need MBS but pt has not agreed to procedure. Pt consumed soft foods given poor dentition.  Pt reports UBW of 150-152 lbs. States he has had a 30# weight loss since beginning cancer treatments. This is true but pt has not had any recent weight loss. Since 1/6, pt has lost 5 lbs.   Labs reviewed. Medications:  Calcium carbonate, Marinol  NUTRITION - FOCUSED PHYSICAL EXAM:    Most Recent Value  Orbital Region Mild depletion  Upper Arm Region Severe depletion  Thoracic and Lumbar Region Unable to assess  Buccal Region Mild depletion  Temple Region Moderate depletion  Clavicle Bone Region Severe depletion  Clavicle and Acromion Bone Region Severe depletion  Scapular Bone Region Severe depletion  Dorsal Hand Moderate depletion  Patellar Region Unable to assess  Anterior Thigh Region Unable to assess  Posterior Calf Region Unable to assess  Hair Reviewed  Eyes Reviewed       Diet Order:   Diet Order            Diet regular Room service appropriate? Yes; Fluid consistency: Thin  Diet effective now                 EDUCATION NEEDS:   No education needs have been identified at this time  Skin:  Skin Assessment: Reviewed RN Assessment  Last BM:  6/14 -type 6  Height:   Ht Readings from Last 1 Encounters:  02/19/20 5\' 11"  (1.803 m)  Weight:   Wt Readings from Last 1 Encounters:  02/19/20 54.5 kg   BMI:  Body mass index is 16.76 kg/m.  Estimated Nutritional Needs:   Kcal:  2300-2500  Protein:  115-130g  Fluid:  2.3L/day   Clayton Bibles, MS, RD, LDN Inpatient Clinical Dietitian Contact information available via Amion

## 2020-02-20 NOTE — Progress Notes (Signed)
°  Speech Language Pathology Treatment: Dysphagia  Patient Details Name: Kyle Foster MRN: [id number] DOB: 28-Jan-1946 Today's Date: 02/20/2020 Time: 2993-7169 SLP Time Calculation (min) (ACUTE ONLY): 14 min  Assessment / Plan / Recommendation Clinical Impression  Purpose of visit is to inititate dysphagia treatment, establish mandibular ROM for creation of trismus device.  SLP observed pt with intake of liquids - delayed swallow of last bolus of liquid noted but no indication of aspiration.  Reviewed heimlich manuever and universal sign of choking, pH neutral status of water making it safest to consume, alternative ways to take medications if problematic and advised starting meals with liquids and ordering extra gravies/sauces using teach back.    Pt's mandibular ROM was measured at 43 mm with normal range being 50-60  - with pt's approval trismus device will be made and provided to him tomorrow.    SLP can not rule out pharyngeal dysphagia with pt's h/o surgery and XRT and advised pt consider undergoing MBS to allow instrumental evaluation.  Pt advised he will consider MBS and SLP will follow up next date. Given this is his first pna *per pt and spouse* and he denies significant dysphagia, MBS may not change his outcome.        HPI HPI: 74 yo male adm to [facility] with fatigue, decreased po intake, deconditioning, cough - imaging concerning for left lower lobe airspace disease.  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  Pt continues to smoke approx 6-10 cigarettes daily. He reports he also have reflux symptoms for which he takes Tum although he is on Pantoprazole *Dr Alvester Chou placed him on this.  Pt denies having dysphagia but does eat soft foods due to his lack of dentition and uses straws due to decreased left labial closure.  He has not had pneumonias as he reports this to be his first and has not  required heimlich manuever.  SLP follow up for dysphagia treatment for trismus - measuring for trismus device creation and for general aspiration precaution education.      SLP Plan  Continue with current plan of care       Recommendations  Diet recommendations: Regular;Thin liquid Liquids provided via: Straw Medication Administration: Whole meds with liquid Compensations: Small sips/bites;Slow rate (start all intake with liquids) Postural Changes and/or Swallow Maneuvers: Seated upright 90 degrees;Upright 30-60 min after meal                Oral Care Recommendations: Oral care BID SLP Visit Diagnosis: Dysphagia, unspecified (R13.10);Dysphagia, oral phase (R13.11) Plan: Continue with current plan of care       Kyle Foster 02/20/2020, 1:16 PM   Kyle Lime, MS [place] Office [phone]

## 2020-02-20 NOTE — Progress Notes (Signed)
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  6/14: MRSA swab negative. Hold vanc. PT consult for weakness. Questionable dysphagia. Consult SLP.   Assessment & Plan:   Principal Problem:   Aspiration pneumonia of left lower lobe due to gastric secretions (HCC) Active Problems:   Mixed hyperlipidemia   Severe protein-calorie malnutrition (HCC)   Coronary artery disease   COPD with chronic bronchitis and emphysema (HCC)   Pulmonary infarct (HCC)   Squamous cell carcinoma of lung, stage IV (HCC)   GERD without esophagitis   Left lower lobe pneumonia  Aspiration pneumonia of left lower lobe due to gastric secretions SIRS Pulmonary infarct     - Patient presenting with 3-day history of generalized malaise, weakness, chills fevers and increasing severity of cough     - CT imaging abdomen pelvis incidentally found left lower lobe pneumonia, possibly secondary to aspiration with adjacent pulmonary infarct.     - CTA chest w/o evidence of PE     - Patient exhibiting fevers and tachypnea in the emergency department suggestive of SIRS without evidence of organ failure      - MRSA swab negative, hold vanc; continue cefepime     - Bld Cx NTD     - continue fluids.      - some concern for possible dysphagia; consult SLP  Microscopic hematuria     - Based on urinalysis     - Likely caused by nephrolithiasis which  patient has a longstanding history of No evidence of obstructive stones on CT imaging of the abdomen pelvis.     - Outpatient follow-up  Severe protein-calorie malnutrition ([place])     - History of poor oral intake with evidence of muscle wasting on examination     - Body mass index is 17.02 kg/m.     - nutrition consult  Coronary artery disease     - denies CP     - EKG: NSR w/ RBBB and LAFB     - continue lipitor, plavix, metoprolol  COPD with chronic bronchitis and emphysema (HCC)     - O2 as needed     - continue breo ellipta and incruse ellipta  Squamous cell carcinoma of lung, stage IV ([employer])     - Known extensive disease with metastases to thoracic spine and ribs     - Patient follows with Dr. [name] with Oncology     - Patient is status post palliative radiation therapy of the left upper lobe in February 2020     - Patient is status post complete course of carboplatin, Taxol in June 2020     - Patient currently on pembrolizumab     - Continue outpatient follow-up     - Dr [name] is aware pt is inpt  Mixed hyperlipidemia     - continue atorvastatin  GERD without esophagitis     - continue protonix  Normocytic anemia     - no evidence  of bleed, follow  Generalized weakness     - PT consult  DVT prophylaxis: lovenox Code Status: FULL Family Communication: With wife and dtr at bedside   Status is: Inpatient  Remains inpatient appropriate because:Inpatient level of care appropriate due to severity of illness   Dispo: The patient is from: Home              Anticipated d/c is to: Home              Anticipated d/c date is: 3 days              Patient currently is not medically stable to d/c.  Consultants:   None  Antimicrobials:  . Cefepime   ROS:  Denies CP, N, V, ab pain . Remainder 10-pt ROS is negative for all not previously mentioned.  Subjective: "It may happen once in a while, but not often."  Objective: Vitals:   02/20/20 0405 02/20/20 0607  02/20/20 0956 02/20/20 1300  BP: (!) 94/48 (!) 105/53 107/61 (!) 92/45  Pulse: 77 70 74 68  Resp: 18 (!) 23 17 16   Temp: 99.4 F (37.4 C) 99.4 F (37.4 C) (!) 100.5 F (38.1 C) 98.7 F (37.1 C)  TempSrc: Oral Oral Oral Oral  SpO2: 97% 97% 96% 98%  Weight:      Height:        Intake/Output Summary (Last 24 hours) at 02/20/2020 1501 Last data filed at 02/20/2020 1230 Gross per 24 hour  Intake 2949.71 ml  Output 900 ml  Net 2049.71 ml   Filed Weights   02/19/20 1520 02/19/20 2306  Weight: 55.3 kg 54.5 kg   Examination:  General: 74 y.o. male resting in bed in NAD Cardiovascular: RRR, +S1, S2, no m/g/r, equal pulses throughout Respiratory: soft rhonchi left base, no w/rhales, normal WOB GI: BS+, NDNT, soft MSK: No e/c/c Neuro: A&O x 3, no focal deficits Psyc: Appropriate interaction and affect, calm/cooperative  Data Reviewed: I have personally reviewed following labs and imaging studies.  CBC: Recent Labs  Lab 02/19/20 1603 02/20/20 0520  WBC 10.1 10.9*  NEUTROABS  --  9.8*  HGB 13.3 10.9*  HCT 39.9 31.4*  MCV 98.8 96.6  PLT 126* 937*   Basic Metabolic Panel: Recent Labs  Lab 02/19/20 1603 02/20/20 0520  NA 136 136  K 4.7 3.5  CL 99 102  CO2 26 24  GLUCOSE 121* 103*  BUN 24* 19  CREATININE 1.09 0.85  CALCIUM 8.8* 7.8*  MG  --  1.8   GFR: Estimated Creatinine Clearance: 60.6 mL/min (by C-G formula based on SCr of 0.85 mg/dL). Liver Function Tests: Recent Labs  Lab 02/19/20 1603 02/20/20 0520  AST 26 22  ALT 20 20  ALKPHOS 54 40  BILITOT 0.7 0.7  PROT 7.3 5.7*  ALBUMIN 3.7 2.9*   No results for input(s): LIPASE, AMYLASE in the last 168 hours. No results for input(s): AMMONIA in the last 168 hours. Coagulation Profile: Recent Labs  Lab 02/19/20 1603  INR 1.1   Cardiac Enzymes: No results for input(s): CKTOTAL, CKMB, CKMBINDEX, TROPONINI in the last 168 hours. BNP (last 3 results) No results for input(s): PROBNP in the last 8760  hours. HbA1C: No results for input(s): HGBA1C in the last 72 hours. CBG: No results for input(s): GLUCAP in the last 168 hours. Lipid Profile: No results for input(s): CHOL, HDL, LDLCALC, TRIG, CHOLHDL, LDLDIRECT in the last 72 hours. Thyroid Function Tests: No results for input(s): TSH, T4TOTAL,  FREET4, T3FREE, THYROIDAB in the last 72 hours. Anemia Panel: No results for input(s): VITAMINB12, FOLATE, FERRITIN, TIBC, IRON, RETICCTPCT in the last 72 hours. Sepsis Labs: Recent Labs  Lab 02/19/20 1603  LATICACIDVEN 1.5    Recent Results (from the past 240 hour(s))  Culture, blood (Routine x 2)     Status: None (Preliminary result)   Collection Time: 02/19/20  4:03 PM   Specimen: BLOOD RIGHT FOREARM  Result Value Ref Range Status   Specimen Description   Final    BLOOD RIGHT FOREARM Performed at [facility], [address] [place], [place] [address]    Special Requests   Final    BOTTLES DRAWN AEROBIC AND ANAEROBIC Blood Culture adequate volume Performed at [facility], [address] [place], [place] [address]    Culture   Final    NO GROWTH < 12 HOURS Performed at [facility], [place] [address], [place], [place] [address]    Report Status PENDING  Incomplete  Culture, blood (Routine x 2)     Status: None (Preliminary result)   Collection Time: 02/19/20  4:47 PM   Specimen: BLOOD RIGHT FOREARM  Result Value Ref Range Status   Specimen Description   Final    BLOOD RIGHT FOREARM Performed at [facility], [place] [address] [place], [place] [address]    Special Requests   Final    BOTTLES DRAWN AEROBIC AND ANAEROBIC Blood Culture adequate volume Performed at [facility], [address] [place], [place] [address]    Culture   Final    NO GROWTH < 12 HOURS Performed at [facility] [address], [place], [place] [address]    Report Status PENDING  Incomplete  SARS Coronavirus 2 by RT PCR (hospital order, performed in  [facility] hospital lab) Nasopharyngeal Nasopharyngeal Swab     Status: None   Collection Time: 02/19/20  4:49 PM   Specimen: Nasopharyngeal Swab  Result Value Ref Range Status   SARS Coronavirus 2 NEGATIVE NEGATIVE Final    Comment: (NOTE) SARS-CoV-2 target nucleic acids are NOT DETECTED.  The SARS-CoV-2 RNA is generally detectable in upper and lower respiratory specimens during the acute phase of infection. The lowest concentration of SARS-CoV-2 viral copies this assay can detect is 250 copies / mL. A negative result does not preclude SARS-CoV-2 infection and should not be used as the sole basis for treatment or other patient management decisions.  A negative result may occur with improper specimen collection / handling, submission of specimen other than nasopharyngeal swab, presence of viral mutation(s) within the areas targeted by this assay, and inadequate number of viral copies (<250 copies / mL). A negative result must be combined with clinical observations, patient history, and epidemiological information.  Fact Sheet for Patients:   StrictlyIdeas.no  Fact Sheet for Healthcare Providers: BankingDealers.co.za  This test is not yet approved or  cleared by the [place] FDA and has been authorized for detection and/or diagnosis of SARS-CoV-2 by FDA under an Emergency Use Authorization (EUA).  This EUA will remain in effect (meaning this test can be used) for the duration of the COVID-19 declaration under Section 564(b)(1) of the Act, 21 U.S.C. section 360bbb-3(b)(1), unless the authorization is terminated or revoked sooner.  Performed at [facility], [address] [place], [place] [address]   MRSA PCR Screening     Status: None   Collection Time:  02/19/20 11:25 PM   Specimen: Nasopharyngeal  Result Value Ref Range Status   MRSA by PCR NEGATIVE NEGATIVE Final    Comment:        The GeneXpert MRSA Assay  (FDA approved for NASAL specimens only), is one component of a comprehensive MRSA colonization surveillance program. It is not intended to diagnose MRSA infection nor to guide or monitor treatment for MRSA infections. Performed at [facility], [place] [address] [place], [place] [address]   Culture, sputum-assessment     Status: None   Collection Time: 02/20/20 12:50 AM   Specimen: Sputum  Result Value Ref Range Status   Specimen Description SPUTUM  Final   Special Requests NONE  Final   Sputum evaluation   Final    Sputum specimen not acceptable for testing.  Please recollect.   Notified Ora[place]dun[place],RN @ 2426 02/20/20 SJT Performed at [facility], [place] [address] [place], [place] [address]    Report Status 02/20/2020 FINAL  Final      Radiology Studies: DG Chest 2 View  Result Date: 02/19/2020 CLINICAL DATA:  Suspected sepsis. Weakness and fever. Diminished appetite. Radiologic records state non-small cell lung cancer. EXAM: CHEST - 2 VIEW COMPARISON:  Chest CT last month 02/02/2020. FINDINGS: Right chest port with tip in the SVC. Stable scarring in the suprahilar left lung, corresponding to treated malignancy on recent CT. Mild chronic left lung volume loss. Underlying emphysema with hyperinflation. No acute or focal airspace disease. No significant pleural effusion. No pneumothorax. Bony destruction involving posterior left fifth and sixth ribs involving the adjacent vertebral body, not well demonstrated by radiograph. IMPRESSION: 1. No acute chest findings. 2. Stable scarring in the suprahilar left lung, corresponding to treated malignancy on recent CT. 3. Hyperinflation and bronchial thickening consistent with COPD. Electronically Signed   By: Keith Rake M.D.   On: 02/19/2020 16:11   CT ANGIO CHEST PE W OR WO CONTRAST  Result Date: 02/20/2020 CLINICAL DATA:  Pneumonia.  Evaluate for pulmonary embolus. EXAM: CT ANGIOGRAPHY CHEST WITH  CONTRAST TECHNIQUE: Multidetector CT imaging of the chest was performed using the standard protocol during bolus administration of intravenous contrast. Multiplanar CT image reconstructions and MIPs were obtained to evaluate the vascular anatomy. CONTRAST:  138mL OMNIPAQUE IOHEXOL 350 MG/ML SOLN COMPARISON:  02/02/2020 FINDINGS: Cardiovascular: No filling defects in the pulmonary arteries to suggest acute pulmonary embolus. 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 Heart is normal size. Diffuse coronary artery calcifications and aortic atherosclerosis. No aneurysm. Mediastinum/Nodes: No mediastinal, hilar, or axillary adenopathy. Lungs/Pleura: Area of architectural distortion and cavitation again seen in the superior segment of the left lower lobe, stable. Abnormal bronchial wall thickening in the left mainstem bronchus and left lower lobe. Airspace disease in the left lower lobe compatible with pneumonia. Small left effusion. Biapical scarring. Moderate centrilobular and paraseptal emphysema. Upper Abdomen: Large cyst in the upper pole of the left kidney, unchanged. No acute findings. Musculoskeletal: Large lytic destructive process seen within the T5 and T6 vertebral bodies as well as the adjacent medial left 5th and 6th ribs, unchanged since prior studies. Chest wall soft tissues unremarkable. Review of the MIP images confirms the above findings. IMPRESSION: No evidence of acute pulmonary embolus. 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 This may be related to tumor occlusion or post treatment. Continued architectural distortion and cavitary lesion in the superior segment of the  left lower lobe, unchanged. Continued peribronchial thickening. New airspace disease in the left lower lobe with small left effusion, most compatible with pneumonia. Stable destructive lytic lesion within the T5 and T6 vertebral  body and adjacent posterior left ribs. Aortic Atherosclerosis (ICD10-I70.0) and Emphysema (ICD10-J43.9). Electronically Signed   By: Rolm Baptise M.D.   On: 02/20/2020 02:10   CT Renal Stone Study  Result Date: 02/19/2020 CLINICAL DATA:  Hematuria of unknown cause, history of fever and hematuria EXAM: CT ABDOMEN AND PELVIS WITHOUT CONTRAST TECHNIQUE: Multidetector CT imaging of the abdomen and pelvis was performed following the standard protocol without IV contrast. COMPARISON:  02/02/2020 FINDINGS: Lower chest: Signs of interstitial and airspace disease in the dependent LEFT chest mild elevation of the LEFT hemidiaphragm. Elevation of the LEFT hemidiaphragm is new compared to the prior study. Small LEFT pleural effusion associated with process at LEFT lung base. Signs of pulmonary emphysema hemidiaphragm Hepatobiliary: Liver normal size and contour. No pericholecystic stranding. Cholelithiasis. Pancreas: Pancreas not clearly visualized. No signs of peripancreatic inflammation to the extent evaluated. Spleen: Spleen normal size and contour. Adrenals/Urinary Tract: Stable appearance of large LEFT renal cyst extending medially from the LEFT kidney measuring approximately 8.5 x 6.8 cm. Nephrolithiasis upper pole of the RIGHT kidney is similar to the prior study. This measures approximately 6 mm. Mild perinephric stranding on the LEFT. Stomach/Bowel: Limited assessment of gastrointestinal tract due to very limited intra-abdominal and mesenteric fat. Formed stool in the descending colon. No signs of free air. Scattered gas-filled loops of bowel throughout the abdomen. Vascular/Lymphatic: Calcific atheromatous plaque throughout the abdominal aorta. No gross adenopathy. No aneurysmal dilation. No pelvic adenopathy. Reproductive: Prostate grossly normal. Other: No free air.  No ascites. Musculoskeletal: Spinal degenerative changes. No acute or destructive bone process. IMPRESSION: 1. Signs of interstitial and airspace  disease in the dependent LEFT chest with mild elevation of the LEFT hemidiaphragm. Small LEFT pleural effusion associated with process at the LEFT lung base. Pneumonia perhaps due to aspiration. Given mixed density the possibility of pulmonary infarct is also considered. 2. Stable appearance of large LEFT renal cyst. Mild bilateral perinephric stranding, correlate with urine studies. 3. Cholelithiasis without evidence of acute cholecystitis. 4. Nephrolithiasis. 5. Limited assessment of gastrointestinal tract due to very limited intra-abdominal and mesenteric fat. 6. Emphysema and aortic atherosclerosis. These results were called by telephone at the time of interpretation on 02/19/2020 at 6:48 pm to provider JOSHUA LONG , who verbally acknowledged these results. Aortic Atherosclerosis (ICD10-I70.0) and Emphysema (ICD10-J43.9). Electronically Signed   By: Zetta Bills M.D.   On: 02/19/2020 18:48     Scheduled Meds: . atorvastatin  80 mg Oral q1800  . calcium carbonate  1 tablet Oral TID WC  . clopidogrel  75 mg Oral Daily  . dronabinol  5 mg Oral BID AC  . DULoxetine  30 mg Oral Daily  . enoxaparin (LOVENOX) injection  40 mg Subcutaneous QHS  . feeding supplement (ENSURE ENLIVE)  237 mL Oral BID BM  . fentaNYL  1 patch Transdermal Q72H  . fluticasone furoate-vilanterol  1 puff Inhalation Daily   And  . umeclidinium bromide  1 puff Inhalation Daily  . metoprolol tartrate  25 mg Oral BID  . nicotine  7 mg Transdermal Daily  . pantoprazole  40 mg Oral Daily  . polyvinyl alcohol  1 drop Both Eyes QHS   Continuous Infusions: . sodium chloride 250 mL (02/19/20 1715)  . sodium chloride 500 mL (02/19/20 1934)  . ceFEPime (MAXIPIME)  IV    . lactated ringers 125 mL/hr at 02/20/20 1230     LOS: 0 days    Time spent: 25 minutes spent in the coordination of care today.    Jonnie Finner, DO [facility]  If 7PM-7AM, please contact night-coverage [email or web address] 02/20/2020, 3:01 PM

## 2020-02-20 NOTE — Progress Notes (Signed)
Pharmacy Antibiotic Note  Kyle Foster is a 74 y.o. male admitted on 02/19/2020 with sepsis.  Pharmacy has been consulted for vancomycin and cefepime dosing. Pt is febrile with Tmax of 101.8 and WBC is WNL. SCr is WNL and lactic acid is <2.  02/20/2020  WBC 10.8, T max 102.8 SCr 1.09> 0.85, CrCl 60.6   Plan: Rec DC vancomycin w/ negative MRSA PCR Vancomycin 500mg  IV Q12H Adjust to Cefepime 2gm IV Q8H F/u renal fxn, C&S, clinical status and trough at SS  Height: 5\' 11"  (180.3 cm) Weight: 54.5 kg (120 lb 3.2 oz) IBW/kg (Calculated) : 75.3  Temp (24hrs), Avg:100.5 F (38.1 C), Min:98.9 F (37.2 C), Max:102.8 F (39.3 C)  Recent Labs  Lab 02/19/20 1603 02/20/20 0520  WBC 10.1 10.9*  CREATININE 1.09 0.85  LATICACIDVEN 1.5  --     Estimated Creatinine Clearance: 60.6 mL/min (by C-G formula based on SCr of 0.85 mg/dL).    Allergies  Allergen Reactions  . Codeine Other (See Comments)    HEADACHE  headaches  Antimicrobials this admission:  Vanc 6/13>> Cefepime 6/13>> Flagyl x 1 6/13 Dose adjustments this admission:  6/14 cef 2 q12> 2 q8 Microbiology results:  6/14 sputum:  not acceptable sample 6/13 MRSA NEG 6/13 BCx2: ngtd 6/13 UCx: sent 6/13 strep pneum neg 6/14 HIV NR  Thank you for allowing pharmacy to be a part of this patient's care.  Eudelia Bunch, Pharm.D 02/20/2020 10:40 AM
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
## 2020-02-21 LAB — CBC WITH DIFFERENTIAL/PLATELET
Abs Immature Granulocytes: 0.05 10*3/uL (ref 0.00–0.07)
Basophils Absolute: 0 10*3/uL (ref 0.0–0.1)
Basophils Relative: 0 %
Eosinophils Absolute: 0 10*3/uL (ref 0.0–0.5)
Eosinophils Relative: 0 %
HCT: 33.8 % — ABNORMAL LOW (ref 39.0–52.0)
Hemoglobin: 11.5 g/dL — ABNORMAL LOW (ref 13.0–17.0)
Immature Granulocytes: 1 %
Lymphocytes Relative: 3 %
Lymphs Abs: 0.2 10*3/uL — ABNORMAL LOW (ref 0.7–4.0)
MCH: 32.9 pg (ref 26.0–34.0)
MCHC: 34 g/dL (ref 30.0–36.0)
MCV: 96.6 fL (ref 80.0–100.0)
Monocytes Absolute: 0.7 10*3/uL (ref 0.1–1.0)
Monocytes Relative: 7 %
Neutro Abs: 8.3 10*3/uL — ABNORMAL HIGH (ref 1.7–7.7)
Neutrophils Relative %: 89 %
Platelets: 124 10*3/uL — ABNORMAL LOW (ref 150–400)
RBC: 3.5 MIL/uL — ABNORMAL LOW (ref 4.22–5.81)
RDW: 13.4 % (ref 11.5–15.5)
WBC: 9.2 10*3/uL (ref 4.0–10.5)
nRBC: 0 % (ref 0.0–0.2)

## 2020-02-21 LAB — COMPREHENSIVE METABOLIC PANEL
ALT: 22 U/L (ref 0–44)
AST: 29 U/L (ref 15–41)
Albumin: 2.7 g/dL — ABNORMAL LOW (ref 3.5–5.0)
Alkaline Phosphatase: 37 U/L — ABNORMAL LOW (ref 38–126)
Anion gap: 9 (ref 5–15)
BUN: 23 mg/dL (ref 8–23)
CO2: 23 mmol/L (ref 22–32)
Calcium: 7.7 mg/dL — ABNORMAL LOW (ref 8.9–10.3)
Chloride: 101 mmol/L (ref 98–111)
Creatinine, Ser: 0.9 mg/dL (ref 0.61–1.24)
GFR calc Af Amer: >60 mL/min (ref >60)
GFR calc non Af Amer: >60 mL/min (ref >60)
Glucose, Bld: 107 mg/dL — ABNORMAL HIGH (ref 70–99)
Potassium: 3.3 mmol/L — ABNORMAL LOW (ref 3.5–5.1)
Sodium: 133 mmol/L — ABNORMAL LOW (ref 135–145)
Total Bilirubin: 0.7 mg/dL (ref 0.3–1.2)
Total Protein: 5.7 g/dL — ABNORMAL LOW (ref 6.5–8.1)

## 2020-02-21 LAB — URINE CULTURE: Culture: NO GROWTH

## 2020-02-21 LAB — MAGNESIUM: Magnesium: 1.9 mg/dL (ref 1.7–2.4)

## 2020-02-21 MED ORDER — POTASSIUM CHLORIDE CRYS ER 20 MEQ PO TBCR
20.0000 meq | EXTENDED_RELEASE_TABLET | Freq: Two times a day (BID) | ORAL | Status: DC
  Administered 2020-02-21 – 2020-02-23 (×5): 20 meq via ORAL
  Filled 2020-02-21 (×5): qty 1

## 2020-02-21 MED ORDER — LOPERAMIDE HCL 1 MG/7.5ML PO SUSP
2.0000 mg | ORAL | Status: DC | PRN
  Administered 2020-02-21: 2 mg via ORAL
  Filled 2020-02-21 (×2): qty 15

## 2020-02-21 NOTE — Progress Notes (Addendum)
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  6/15: Continue cefepime. SLP has rec'd regular/thin. PT recs home health PT and RW. Will order. Continue abx and IS for now.   Assessment & Plan:   Principal Problem:   Aspiration pneumonia of left lower lobe due to gastric secretions (HCC) Active Problems:   Mixed hyperlipidemia   Severe protein-calorie malnutrition (HCC)   Coronary artery disease   COPD with chronic bronchitis and emphysema (HCC)   Pulmonary infarct (HCC)   Squamous cell carcinoma of lung, stage IV (HCC)   GERD without esophagitis   Left lower lobe pneumonia  Left Lower Lobe PNA SIRS Pulmonary infarct     - Patient presenting with 3-day history of generalized malaise, weakness, chills fevers and increasing severity of cough     - CT imaging abdomen pelvis incidentally found left lower lobe pneumonia, possibly secondary to aspiration with adjacent pulmonary infarct.     - CTA chest w/o evidence of PE     - Patient exhibiting fevers and tachypnea in the emergency department suggestive of SIRS without evidence of organ failure      - MRSA swab negative, hold vanc; continue cefepime     - Bld Cx NTD     - continue fluids.      - some concern for possible dysphagia; consult SLP     - 6/15: Continue cefepime. SLP has reviewed. No evidence of dysphagia: regular/thins ordered; let's get him  IS  Microscopic hematuria     - Based on urinalysis     - Likely caused by nephrolithiasis which patient has a longstanding history of No evidence of obstructive stones on CT imaging of the abdomen pelvis.     - Outpatient follow-up  Severe protein-calorie malnutrition ([place])     - History of poor oral intake with evidence of muscle wasting on examination     - Body mass index is 17.02 kg/m.     - nutrition recs noted, appreciate assistance     - continue dronabinol  Coronary artery disease     - denies CP     - EKG: NSR w/ RBBB and LAFB     - continue lipitor, plavix, metoprolol  COPD with chronic bronchitis and emphysema (HCC)     - O2 as needed     - continue breo ellipta and incruse ellipta  Squamous cell carcinoma of lung, stage IV ([place])     - Known extensive disease with metastases to thoracic spine and ribs     - Patient follows with Dr. [name] with Oncology     - Patient is status post palliative radiation therapy of the left upper lobe in February 2020     - Patient is status post complete course of carboplatin, Taxol in June 2020     - Patient currently on pembrolizumab     - Continue outpatient follow-up     - Dr [name] is  aware pt is inpt  Mixed hyperlipidemia     - continue atorvastatin  GERD without esophagitis     - continue protonix, TUMS  Normocytic anemia     - no evidence of bleed, follow  Generalized weakness     - PT rec's HHPT, RW will order  DVT prophylaxis: lovenox Code Status: FULL Family Communication: with wife by phone   Status is: Inpatient  Remains inpatient appropriate because:Inpatient level of care appropriate due to severity of illness   Dispo: The patient is from: Home              Anticipated d/c is to: Home              Anticipated d/c date is: 3 days              Patient currently is not medically stable to d/c.  Consultants:   None  Procedures:   None  Antimicrobials:  . Cefepime   ROS:  Reports D. Denies  ab pain, N, V, HA, CP . Remainder 10-pt ROS is negative for all not previously mentioned.  Subjective: "He came by this morning."  Objective: Vitals:   02/21/20 0534 02/21/20 0802 02/21/20 1016 02/21/20 1422  BP: 110/64  127/60 112/60  Pulse: 75  78 80  Resp: 16  (!) 22 (!) 26  Temp: 99.1 F (37.3 C)  98.7 F (37.1 C) 99.6 F (37.6 C)  TempSrc: Oral  Oral Oral  SpO2: 99% 97% 95% 98%  Weight:      Height:        Intake/Output Summary (Last 24 hours) at 02/21/2020 1529 Last data filed at 02/21/2020 0900 Gross per 24 hour  Intake 615.03 ml  Output 250 ml  Net 365.03 ml   Filed Weights   02/19/20 1520 02/19/20 2306  Weight: 55.3 kg 54.5 kg    Examination:  General: 74 y.o. male resting in bed in NAD Cardiovascular: RRR, +S1, S2, no m/g/r Respiratory: left base improving , no w/r/r, normal WOB GI: BS+, NDNT, no masses noted, no organomegaly noted MSK: No e/c/c Neuro: alert to name, follows commands Psyc: Appropriate interaction and affect, calm/cooperative   Data Reviewed: I have personally reviewed following labs and imaging studies.  CBC: Recent Labs  Lab 02/19/20 1603 02/20/20 0520 02/21/20 0518  WBC 10.1 10.9* 9.2  NEUTROABS  --  9.8* 8.3*  HGB 13.3 10.9* 11.5*  HCT 39.9 31.4* 33.8*  MCV 98.8 96.6 96.6  PLT 126* 116* 295*   Basic Metabolic Panel: Recent Labs  Lab 02/19/20 1603 02/20/20 0520 02/21/20 0518  NA 136 136 133*  K 4.7 3.5 3.3*  CL 99 102 101  CO2 26 24 23   GLUCOSE 121* 103* 107*  BUN 24* 19 23  CREATININE 1.09 0.85 0.90  CALCIUM 8.8* 7.8* 7.7*  MG  --  1.8 1.9   GFR: Estimated Creatinine Clearance: 57.2 mL/min (by C-G formula based on SCr of 0.9 mg/dL). Liver Function Tests: Recent Labs  Lab 02/19/20 1603 02/20/20 0520 02/21/20 0518  AST 26 22 29   ALT 20 20 22   ALKPHOS 54 40 37*  BILITOT 0.7 0.7 0.7  PROT 7.3 5.7* 5.7*  ALBUMIN 3.7 2.9* 2.7*   No results for input(s): LIPASE, AMYLASE in the last 168 hours. No results  for input(s): AMMONIA in the last 168 hours. Coagulation Profile: Recent Labs  Lab 02/19/20 1603  INR 1.1   Cardiac Enzymes: No results for input(s): CKTOTAL, CKMB, CKMBINDEX, TROPONINI in the  last 168 hours. BNP (last 3 results) No results for input(s): PROBNP in the last 8760 hours. HbA1C: No results for input(s): HGBA1C in the last 72 hours. CBG: No results for input(s): GLUCAP in the last 168 hours. Lipid Profile: No results for input(s): CHOL, HDL, LDLCALC, TRIG, CHOLHDL, LDLDIRECT in the last 72 hours. Thyroid Function Tests: No results for input(s): TSH, T4TOTAL, FREET4, T3FREE, THYROIDAB in the last 72 hours. Anemia Panel: No results for input(s): VITAMINB12, FOLATE, FERRITIN, TIBC, IRON, RETICCTPCT in the last 72 hours. Sepsis Labs: Recent Labs  Lab 02/19/20 1603  LATICACIDVEN 1.5    Recent Results (from the past 240 hour(s))  Urine culture     Status: None   Collection Time: 02/19/20  3:24 PM   Specimen: Urine, Clean Catch  Result Value Ref Range Status   Specimen Description   Final    URINE, CLEAN CATCH Performed at [facility], [address] [place], [place] [address]    Special Requests   Final    NONE Performed at [facility], [address] [place], [place] [address]    Culture   Final    NO GROWTH Performed at [facility], [place] [address], [place], [place] [address]    Report Status 02/21/2020 FINAL  Final  Culture, blood (Routine x 2)     Status: None (Preliminary result)   Collection Time: 02/19/20  4:03 PM   Specimen: BLOOD RIGHT FOREARM  Result Value Ref Range Status   Specimen Description   Final    BLOOD RIGHT FOREARM Performed at [facility], [address] [place], [place] [address]    Special Requests   Final    BOTTLES DRAWN AEROBIC AND ANAEROBIC Blood Culture adequate volume Performed at [facility], [address] [place], [place] [address]    Culture   Final    NO GROWTH  2 DAYS Performed at [facility], [place] [address], [place], [place] [address]    Report Status PENDING  Incomplete  Culture, blood (Routine x 2)     Status: None (Preliminary result)   Collection Time: 02/19/20  4:47 PM   Specimen: BLOOD RIGHT FOREARM  Result Value Ref Range Status   Specimen Description   Final    BLOOD RIGHT FOREARM Performed at [facility], [place] [address] [place], [place] [address]    Special Requests   Final    BOTTLES DRAWN AEROBIC AND ANAEROBIC Blood Culture adequate volume Performed at [facility], [address] [place], [place] [address]    Culture   Final    NO GROWTH 2 DAYS Performed at [facility], [place] [address], [place], [place] [address]    Report Status PENDING  Incomplete  SARS Coronavirus 2 by RT PCR (hospital order, performed in [facility] hospital lab) Nasopharyngeal Nasopharyngeal Swab     Status: None   Collection Time: 02/19/20  4:49 PM   Specimen: Nasopharyngeal Swab  Result Value Ref Range Status   SARS Coronavirus 2 NEGATIVE NEGATIVE Final    Comment: (NOTE) SARS-CoV-2 target nucleic acids are NOT DETECTED.  The SARS-CoV-2 RNA is generally detectable in upper and lower respiratory specimens during the acute phase of infection. The lowest concentration of SARS-CoV-2 viral copies this assay can detect is 250 copies / mL. A negative result does not preclude SARS-CoV-2 infection and should not be used as the  sole basis for treatment or other patient management decisions.  A negative result may occur with improper specimen collection / handling, submission of specimen other than nasopharyngeal swab, presence of viral mutation(s) within the areas targeted by this assay, and inadequate number of viral copies (<250 copies / mL). A negative result must be combined with clinical observations, patient history, and epidemiological information.  Fact Sheet for Patients:     StrictlyIdeas.no  Fact Sheet for Healthcare Providers: BankingDealers.co.za  This test is not yet approved or  cleared by the [place] FDA and has been authorized for detection and/or diagnosis of SARS-CoV-2 by FDA under an Emergency Use Authorization (EUA).  This EUA will remain in effect (meaning this test can be used) for the duration of the COVID-19 declaration under Section 564(b)(1) of the Act, 21 U.S.C. section 360bbb-3(b)(1), unless the authorization is terminated or revoked sooner.  Performed at [facility], [address] [place], [place] [address]   MRSA PCR Screening     Status: None   Collection Time: 02/19/20 11:25 PM   Specimen: Nasopharyngeal  Result Value Ref Range Status   MRSA by PCR NEGATIVE NEGATIVE Final    Comment:        The GeneXpert MRSA Assay (FDA approved for NASAL specimens only), is one component of a comprehensive MRSA colonization surveillance program. It is not intended to diagnose MRSA infection nor to guide or monitor treatment for MRSA infections. Performed at [facility], [place] [address] [place], [place] [address]   Culture, sputum-assessment     Status: None   Collection Time: 02/20/20 12:50 AM   Specimen: Sputum  Result Value Ref Range Status   Specimen Description SPUTUM  Final   Special Requests NONE  Final   Sputum evaluation   Final    Sputum specimen not acceptable for testing.  Please recollect.   Notified [place],RN @ [phone] 02/20/20 SJT Performed at [facility], [place] [address], [place], [place] [address]    Report Status 02/20/2020 FINAL  Final      Radiology Studies: DG Chest 2 View  Result Date: 02/19/2020 CLINICAL DATA:  Suspected sepsis. Weakness and fever. Diminished appetite. Radiologic records state non-small cell lung cancer. EXAM: CHEST - 2 VIEW COMPARISON:  Chest CT last month 02/02/2020. FINDINGS: Right  chest port with tip in the SVC. Stable scarring in the suprahilar left lung, corresponding to treated malignancy on recent CT. Mild chronic left lung volume loss. Underlying emphysema with hyperinflation. No acute or focal airspace disease. No significant pleural effusion. No pneumothorax. Bony destruction involving posterior left fifth and sixth ribs involving the adjacent vertebral body, not well demonstrated by radiograph. IMPRESSION: 1. No acute chest findings. 2. Stable scarring in the suprahilar left lung, corresponding to treated malignancy on recent CT. 3. Hyperinflation and bronchial thickening consistent with COPD. Electronically Signed   By: Keith Rake M.D.   On: 02/19/2020 16:11   CT ANGIO CHEST PE W OR WO CONTRAST  Result Date: 02/20/2020 CLINICAL DATA:  Pneumonia.  Evaluate for pulmonary embolus. EXAM: CT ANGIOGRAPHY CHEST WITH CONTRAST TECHNIQUE: Multidetector CT imaging of the chest was performed using the standard protocol during bolus administration of intravenous contrast. Multiplanar CT image reconstructions and MIPs were obtained to evaluate the vascular anatomy. CONTRAST:  115mL OMNIPAQUE IOHEXOL 350 MG/ML SOLN COMPARISON:  02/02/2020 FINDINGS: Cardiovascular: No filling defects in the pulmonary arteries to suggest acute pulmonary embolus. There is chronic occlusion of the left lower lobe  pulmonary artery, stable dating back to 02/09/2019. Heart is normal size. Diffuse coronary artery calcifications and aortic atherosclerosis. No aneurysm. Mediastinum/Nodes: No mediastinal, hilar, or axillary adenopathy. Lungs/Pleura: Area of architectural distortion and cavitation again seen in the superior segment of the left lower lobe, stable. Abnormal bronchial wall thickening in the left mainstem bronchus and left lower lobe. Airspace disease in the left lower lobe compatible with pneumonia. Small left effusion. Biapical scarring. Moderate centrilobular and paraseptal emphysema. Upper Abdomen:  Large cyst in the upper pole of the left kidney, unchanged. No acute findings. Musculoskeletal: Large lytic destructive process seen within the T5 and T6 vertebral bodies as well as the adjacent medial left 5th and 6th ribs, unchanged since prior studies. Chest wall soft tissues unremarkable. Review of the MIP images confirms the above findings. IMPRESSION: No evidence of acute pulmonary embolus. 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 This may be related to tumor occlusion or post treatment. Continued architectural distortion and cavitary lesion in the superior segment of the left lower lobe, unchanged. Continued peribronchial thickening. New airspace disease in the left lower lobe with small left effusion, most compatible with pneumonia. Stable destructive lytic lesion within the T5 and T6 vertebral body and adjacent posterior left ribs. Aortic Atherosclerosis (ICD10-I70.0) and Emphysema (ICD10-J43.9). Electronically Signed   By: Rolm Baptise M.D.   On: 02/20/2020 02:10   CT Renal Stone Study  Result Date: 02/19/2020 CLINICAL DATA:  Hematuria of unknown cause, history of fever and hematuria EXAM: CT ABDOMEN AND PELVIS WITHOUT CONTRAST TECHNIQUE: Multidetector CT imaging of the abdomen and pelvis was performed following the standard protocol without IV contrast. COMPARISON:  02/02/2020 FINDINGS: Lower chest: Signs of interstitial and airspace disease in the dependent LEFT chest mild elevation of the LEFT hemidiaphragm. Elevation of the LEFT hemidiaphragm is new compared to the prior study. Small LEFT pleural effusion associated with process at LEFT lung base. Signs of pulmonary emphysema hemidiaphragm Hepatobiliary: Liver normal size and contour. No pericholecystic stranding. Cholelithiasis. Pancreas: Pancreas not clearly visualized. No signs of peripancreatic inflammation to the extent evaluated. Spleen: Spleen normal size and contour. Adrenals/Urinary Tract:  Stable appearance of large LEFT renal cyst extending medially from the LEFT kidney measuring approximately 8.5 x 6.8 cm. Nephrolithiasis upper pole of the RIGHT kidney is similar to the prior study. This measures approximately 6 mm. Mild perinephric stranding on the LEFT. Stomach/Bowel: Limited assessment of gastrointestinal tract due to very limited intra-abdominal and mesenteric fat. Formed stool in the descending colon. No signs of free air. Scattered gas-filled loops of bowel throughout the abdomen. Vascular/Lymphatic: Calcific atheromatous plaque throughout the abdominal aorta. No gross adenopathy. No aneurysmal dilation. No pelvic adenopathy. Reproductive: Prostate grossly normal. Other: No free air.  No ascites. Musculoskeletal: Spinal degenerative changes. No acute or destructive bone process. IMPRESSION: 1. Signs of interstitial and airspace disease in the dependent LEFT chest with mild elevation of the LEFT hemidiaphragm. Small LEFT pleural effusion associated with process at the LEFT lung base. Pneumonia perhaps due to aspiration. Given mixed density the possibility of pulmonary infarct is also considered. 2. Stable appearance of large LEFT renal cyst. Mild bilateral perinephric stranding, correlate with urine studies. 3. Cholelithiasis without evidence of acute cholecystitis. 4. Nephrolithiasis. 5. Limited assessment of gastrointestinal tract due to very limited intra-abdominal and mesenteric fat. 6. Emphysema and aortic atherosclerosis. These results were called by telephone at the time of interpretation on 02/19/2020 at 6:48 pm to provider JOSHUA LONG , who verbally  acknowledged these results. Aortic Atherosclerosis (ICD10-I70.0) and Emphysema (ICD10-J43.9). Electronically Signed   By: Zetta Bills M.D.   On: 02/19/2020 18:48     Scheduled Meds: . atorvastatin  80 mg Oral q1800  . calcium carbonate  1 tablet Oral TID WC  . clopidogrel  75 mg Oral Daily  . dronabinol  5 mg Oral BID AC  .  DULoxetine  30 mg Oral Daily  . enoxaparin (LOVENOX) injection  40 mg Subcutaneous QHS  . feeding supplement (ENSURE ENLIVE)  237 mL Oral BID BM  . fentaNYL  1 patch Transdermal Q72H  . fluticasone furoate-vilanterol  1 puff Inhalation Daily   And  . umeclidinium bromide  1 puff Inhalation Daily  . metoprolol tartrate  25 mg Oral BID  . nicotine  7 mg Transdermal Daily  . pantoprazole  40 mg Oral Daily  . polyvinyl alcohol  1 drop Both Eyes QHS  . potassium chloride  20 mEq Oral BID   Continuous Infusions: . sodium chloride 250 mL (02/19/20 1715)  . sodium chloride 500 mL (02/19/20 1934)  . ceFEPime (MAXIPIME) IV 2 g (02/21/20 1338)     LOS: 1 day    Time spent: 25 minutes spent in the coordination of care today.    Jonnie Finner, DO [facility]  If 7PM-7AM, please contact night-coverage [email or web address] 02/21/2020, 3:29 PM

## 2020-02-21 NOTE — Progress Notes (Addendum)
   02/20/20 2213  Assess: MEWS Score  Temp (!) 102.6 F (39.2 C)  BP (!) 121/55  Pulse Rate 74  Resp (!) 21  SpO2 95 %  O2 Device Room Air  Assess: MEWS Score  MEWS Temp 2  MEWS Systolic 0  MEWS Pulse 0  MEWS RR 1  MEWS LOC 0  MEWS Score 3  MEWS Score Color Yellow  Assess: if the MEWS score is Yellow or Red  Were vital signs taken at a resting state? Yes  Focused Assessment Documented focused assessment  Early Detection of Sepsis Score *See Row Information* Medium  MEWS guidelines implemented *See Row Information* Yes  Treat  MEWS Interventions Administered prn meds/treatments  Take Vital Signs  Increase Vital Sign Frequency  Yellow: Q 2hr X 2 then Q 4hr X 2, if remains yellow, continue Q 4hrs  Escalate  MEWS: Escalate Yellow: discuss with charge nurse/RN and consider discussing with provider and RRT  Notify: Charge Nurse/RN  Name of Charge Nurse/RN Notified Riche, Rn  Date Charge Nurse/RN Notified 02/20/20  Time Charge Nurse/RN Notified [phone]

## 2020-02-21 NOTE — Evaluation (Signed)
Physical Therapy Evaluation Patient Details Name: Kyle Foster MRN: [id number] DOB: [birth year] Today's Date: 02/21/2020   History of Present Illness  74 yo male admitted with asp pna, weakness. Hx of met lung ca, NSTEMI, CAD, bladder ca, parotid ca  Clinical Impression  On eval, pt was Min guard assist for mobility. He walked ~135 around the unit. Mildly unsteady. He tolerated activity well. Will continue to follow and progress activity as tolerated. Discussed d/c plan-wife would like pt to have HHPT f/u and a RW for home-pt agreeable.     Follow Up Recommendations Home health PT    Equipment Recommendations  Rolling walker with 5" wheels    Recommendations for Other Services       Precautions / Restrictions Precautions Precautions: Fall Restrictions Weight Bearing Restrictions: No      Mobility  Bed Mobility Overal bed mobility: Modified Independent                Transfers Overall transfer level: Needs assistance   Transfers: Sit to/from Stand Sit to Stand: Supervision            Ambulation/Gait Ambulation/Gait assistance: Min guard Gait Distance (Feet): 135 Feet Assistive device: None Gait Pattern/deviations: Step-through pattern;Decreased stride length     General Gait Details: mildly unsteady especially with turns/changes in direction. pt denied dizziness. tolerated distance well. did not venture to far away from room 2* pt worried about having an episode of diarrhea  Stairs            Wheelchair Mobility    Modified Rankin (Stroke Patients Only)       Balance Overall balance assessment: Mild deficits observed, not formally tested                                           Pertinent Vitals/Pain Pain Assessment: No/denies pain    Home Living Family/patient expects to be discharged to:: Private residence Living Arrangements: Spouse/significant other Available Help at Discharge: Family Type of Home: House Home  Access: Stairs to enter   Technical brewer of Steps: 10 Home Layout: One level Home Equipment: None      Prior Function Level of Independence: Independent               Hand Dominance        Extremity/Trunk Assessment   Upper Extremity Assessment Upper Extremity Assessment: Overall WFL for tasks assessed    Lower Extremity Assessment Lower Extremity Assessment: Generalized weakness    Cervical / Trunk Assessment Cervical / Trunk Assessment: Kyphotic  Communication   Communication: No difficulties  Cognition Arousal/Alertness: Awake/alert Behavior During Therapy: WFL for tasks assessed/performed Overall Cognitive Status: Within Functional Limits for tasks assessed                                        General Comments      Exercises     Assessment/Plan    PT Assessment Patient needs continued PT services  PT Problem List Decreased strength;Decreased mobility;Decreased balance       PT Treatment Interventions DME instruction;Gait training;Therapeutic activities;Therapeutic exercise;Patient/family education;Functional mobility training;Balance training    PT Goals (Current goals can be found in the Care Plan section)  Acute Rehab PT Goals Patient Stated Goal: to get strength back PT Goal Formulation: With  patient/family Time For Goal Achievement: 03/06/20    Frequency Min 3X/week   Barriers to discharge        Co-evaluation               AM-PAC PT "6 Clicks" Mobility  Outcome Measure Help needed turning from your back to your side while in a flat bed without using bedrails?: None Help needed moving from lying on your back to sitting on the side of a flat bed without using bedrails?: None Help needed moving to and from a bed to a chair (including a wheelchair)?: A Little Help needed standing up from a chair using your arms (e.g., wheelchair or bedside chair)?: A Little Help needed to walk in hospital room?: A  Little Help needed climbing 3-5 steps with a railing? : A Little 6 Click Score: 20    End of Session Equipment Utilized During Treatment: Gait belt Activity Tolerance: Patient tolerated treatment well Patient left: in bed;with call bell/phone within reach;with bed alarm set;with family/visitor present   PT Visit Diagnosis: Unsteadiness on feet (R26.81);Muscle weakness (generalized) (M62.81)    Time: 2023-3435 PT Time Calculation (min) (ACUTE ONLY): 14 min   Charges:   PT Evaluation $PT Eval Low Complexity: [place], PT Acute Rehabilitation  Office: [phone] Pager: [phone]

## 2020-02-21 NOTE — Progress Notes (Signed)
°  Speech Language Pathology Treatment: Dysphagia  Patient Details Name: Kyle Foster MRN: [id number] DOB: [birth year] Today's Date: 02/21/2020 Time: 1478-2956 SLP Time Calculation (min) (ACUTE ONLY): 14 min  Assessment / Plan / Recommendation Clinical Impression  Trismus device provided to pt with him demonstrating use using teach back and moderate cues.  Pt conducted exercises x30 with overall work level being 8/10.  Pt denies coughing with intake today - and wife confirms findings.  Pt and wife have decided to forgo MBS at this time - advised given this is his first pna - agree with plan.  Reviewed aspiration precautions and risk factors for aspiration pneumonias.  Recommend continue diet as tolerated, all education completed and exercises provided to help with trismus.  Thanks for allowing me to help care for this pt.    HPI HPI: 74 yo male adm to [facility] with fatigue, decreased po intake, deconditioning, cough - imaging concerning for left lower lobe airspace disease.  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  Pt continues to smoke approx 6-10 cigarettes daily. He reports he also have reflux symptoms for which he takes Tum although he is on Pantoprazole *Dr Alvester Chou placed him on this.  Pt denies having dysphagia but does eat soft foods due to his lack of dentition and uses straws due to decreased left labial closure.  He has not had pneumonias as he reports this to be his first and has not required heimlich manuever.  SLP follow up for dysphagia treatment for trismus - measuring for trismus device creation and for general aspiration precaution education.      SLP Plan  Continue with current plan of care       Recommendations  Diet recommendations: Regular;Thin liquid Liquids provided via: Straw Medication Administration: Whole meds with liquid Supervision: Patient able to self  feed Compensations: Small sips/bites;Slow rate (start all intake with liquids) Postural Changes and/or Swallow Maneuvers: Seated upright 90 degrees;Upright 30-60 min after meal                Oral Care Recommendations: Oral care BID SLP Visit Diagnosis: Dysphagia, unspecified (R13.10);Dysphagia, oral phase (R13.11) Plan: Continue with current plan of care       GO                Macario Golds 02/21/2020, 4:14 PM Kathleen Lime, MS [place] Office [phone]

## 2020-02-21 NOTE — Progress Notes (Signed)
Patient has a temp of 102.6. PRN medication given to treat Fever. Yellow [place] continues. Will Continue to monitor.

## 2020-02-21 NOTE — Plan of Care (Signed)

## 2020-02-22 DIAGNOSIS — J690 Pneumonitis due to inhalation of food and vomit: Secondary | ICD-10-CM

## 2020-02-22 LAB — EXPECTORATED SPUTUM ASSESSMENT W GRAM STAIN, RFLX TO RESP C

## 2020-02-22 LAB — BASIC METABOLIC PANEL
Anion gap: 8 (ref 5–15)
BUN: 27 mg/dL — ABNORMAL HIGH (ref 8–23)
CO2: 25 mmol/L (ref 22–32)
Calcium: 8.3 mg/dL — ABNORMAL LOW (ref 8.9–10.3)
Chloride: 101 mmol/L (ref 98–111)
Creatinine, Ser: 0.87 mg/dL (ref 0.61–1.24)
GFR calc Af Amer: >60 mL/min (ref >60)
GFR calc non Af Amer: >60 mL/min (ref >60)
Glucose, Bld: 150 mg/dL — ABNORMAL HIGH (ref 70–99)
Potassium: 3.3 mmol/L — ABNORMAL LOW (ref 3.5–5.1)
Sodium: 134 mmol/L — ABNORMAL LOW (ref 135–145)

## 2020-02-22 MED ORDER — POTASSIUM CHLORIDE CRYS ER 20 MEQ PO TBCR
40.0000 meq | EXTENDED_RELEASE_TABLET | Freq: Once | ORAL | Status: AC
  Administered 2020-02-22: 40 meq via ORAL
  Filled 2020-02-22: qty 2

## 2020-02-22 MED ORDER — ENSURE ENLIVE PO LIQD
237.0000 mL | Freq: Three times a day (TID) | ORAL | Status: DC
  Administered 2020-02-23: 237 mL via ORAL

## 2020-02-22 NOTE — TOC Initial Note (Signed)
Transition of Care [facility]) - Initial/Assessment Note    Patient Details  Name: Kyle Foster MRN: [id number] Date of Birth: 1946/08/21  Transition of Care [facility]) CM/SW Contact:    Ross Ludwig, LCSW Phone Number: 02/22/2020, 3:06 PM  Clinical Narrative:                  CSW talked with patient's wife on the phone, she stated patient has not had any home health before, and CSW explained how insurance will pay for stay.  Patient's wife was informed of different agencies, she did not have a preference, [facility] agreed to accept patient, CSW spoke to [place] and she agreed to accept patient for home health.                                         Expected Discharge Plan: [place] Barriers to Discharge: Continued Medical Work up   Patient Goals and CMS Choice Patient states their goals for this hospitalization and ongoing recovery are:: To return back home wiht home health sevices CMS Medicare.gov Compare Post Acute Care list provided to:: Patient Represenative (must comment) Choice offered to / list presented to : Spouse  Expected Discharge Plan and Services Expected Discharge Plan: [place] Choice: [place] arrangements for the past 2 months: Single Family Home                   DME Agency: NA Date DME Agency Contacted: 02/22/20 Time DME Agency Contacted: 1400 Representative spoke with at DME Agency: [place]: RN, PT, OT, Nurse's Aide, Social Work [employer] Agency: [place] Date [place]: 02/22/20 Time Portal: 31 Representative spoke with at [place]: [place] Arrangements/Services Living arrangements for the past 2 months: [place] Lives with:: Adult Children Patient language and need for interpreter reviewed:: Yes Do you feel safe going back to the place where you live?: Yes      Need for Family Participation in Patient Care: Yes  (Comment) Care giver support system in place?: Yes (comment)   Criminal Activity/Legal Involvement Pertinent to Current Situation/Hospitalization: No - Comment as needed  Activities of Daily Living Home Assistive Devices/Equipment: None ADL Screening (condition at time of admission) Patient's cognitive ability adequate to safely complete daily activities?: Yes Is the patient deaf or have difficulty hearing?: No Does the patient have difficulty seeing, even when wearing glasses/contacts?: No Does the patient have difficulty concentrating, remembering, or making decisions?: No Patient able to express need for assistance with ADLs?: Yes Does the patient have difficulty dressing or bathing?: No Independently performs ADLs?: Yes (appropriate for developmental age) Does the patient have difficulty walking or climbing stairs?: No Weakness of Legs: Both Weakness of Arms/Hands: None  Permission Sought/Granted Permission sought to share information with : Family Supports, Customer service manager Permission granted to share information with : Yes, Verbal Permission Granted  Share Information with NAME: Caelan, Branden [phone]  [phone]  Permission granted to share info w AGENCY: [place]        Emotional Assessment Appearance:: Appears stated age Attitude/Demeanor/Rapport: Engaged   Orientation: : Oriented to Place, Oriented to  Time Alcohol / Substance Use: Not Applicable Psych Involvement: No (comment)  Admission diagnosis:  CAP (community acquired pneumonia) [J18.9] Community acquired  pneumonia of left lower lobe of lung [J18.9] Left lower lobe pneumonia [J18.9] Patient Active Problem List   Diagnosis Date Noted  . Left lower lobe pneumonia 02/20/2020  . Pulmonary infarct ([place]) 02/19/2020  . Aspiration pneumonia of left lower lobe due to gastric secretions ([place]) 02/19/2020  . Squamous cell carcinoma of lung, stage IV ([place]) 02/19/2020  . GERD  without esophagitis 02/19/2020  . COPD with chronic bronchitis and emphysema ([place]) 05/02/2019  . 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 H/O agent Orange exposure 03/22/2019  . Centrilobular emphysema ([place]) 03/21/2019  . Squamous cell lung cancer, left ([place]) 11/24/2018  . Counseling regarding advance care planning and goals of care 11/24/2018  . Metastatic cancer ([place])   . Malignant neoplasm of upper lobe of left lung ([place])   . Bone metastases ([place])   . Severe protein-calorie malnutrition ([place]) 10/15/2018  . Palliative care by specialist   . Goals of care, counseling/discussion   . Cancer associated pain 10/14/2018  . CAD S/P percutaneous coronary angioplasty 07/13/2013  . Essential hypertension 07/13/2013  . Mixed hyperlipidemia 07/13/2013  . Tobacco abuse 07/13/2013  . Coronary artery disease 07/13/2013  . 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 [place] [address] Phone: [phone] Fax: [phone]     Social Determinants of Health (SDOH) Interventions    Readmission Risk Interventions Readmission Risk Prevention Plan 02/22/2020  Transportation Screening Complete  HRI or [place] Complete  Social Work Consult for [place] Planning/Counseling Complete  Palliative Care Screening Complete  Medication Review Press photographer) Referral to Pharmacy  Some recent data might be hidden

## 2020-02-22 NOTE — Plan of Care (Signed)
  Problem: Activity: Goal: Ability to tolerate increased activity will improve Outcome: Progressing   Problem: Clinical Measurements: Goal: Ability to maintain a body temperature in the normal range will improve Outcome: Progressing   Problem: Respiratory: Goal: Ability to maintain adequate ventilation will improve Outcome: Progressing Goal: Ability to maintain a clear airway will improve Outcome: Progressing   Problem: Education: Goal: Knowledge of General Education information will improve Description: Including pain rating scale, medication(s)/side effects and non-pharmacologic comfort measures Outcome: Progressing   Problem: Health Behavior/Discharge Planning: Goal: Ability to manage health-related needs will improve Outcome: Progressing   Problem: Clinical Measurements: Goal: Will remain free from infection Outcome: Progressing Goal: Diagnostic test results will improve Outcome: Progressing   Problem: Nutrition: Goal: Adequate nutrition will be maintained Outcome: Progressing   Problem: Safety: Goal: Ability to remain free from injury will improve Outcome: Progressing

## 2020-02-22 NOTE — Progress Notes (Signed)
Assumed care of patient from previous nurse. Agree with assessment from previous nurse. Will continue to monitor closely.

## 2020-02-22 NOTE — Progress Notes (Signed)
Physical Therapy Treatment Patient Details Name: Kyle Foster MRN: [id number] DOB: 1946-01-23 Today's Date: 02/22/2020    History of Present Illness 74 yo male admitted with asp pna, weakness. Hx of met lung ca, NSTEMI, CAD, bladder ca, parotid ca    PT Comments    Pt ambulated in hallway and improved distance.  Plan is for HHPT upon d/c.    Follow Up Recommendations  Home health PT     Equipment Recommendations  Rolling walker with 5" wheels    Recommendations for Other Services       Precautions / Restrictions Precautions Precautions: Fall    Mobility  Bed Mobility Overal bed mobility: Modified Independent                Transfers Overall transfer level: Needs assistance Equipment used: None Transfers: Sit to/from Stand Sit to Stand: Supervision            Ambulation/Gait Ambulation/Gait assistance: Min guard;Supervision Gait Distance (Feet): 320 Feet Assistive device: None Gait Pattern/deviations: Step-through pattern;Decreased stride length     General Gait Details: slightly unsteady at times however no overt LOB or assist required   Stairs             Wheelchair Mobility    Modified Rankin (Stroke Patients Only)       Balance                                            Cognition Arousal/Alertness: Awake/alert Behavior During Therapy: WFL for tasks assessed/performed Overall Cognitive Status: Within Functional Limits for tasks assessed                                        Exercises      General Comments        Pertinent Vitals/Pain Pain Assessment: No/denies pain    Home Living                      Prior Function            PT Goals (current goals can now be found in the care plan section) Progress towards PT goals: Progressing toward goals    Frequency    Min 3X/week      PT Plan Current plan remains appropriate    Co-evaluation               AM-PAC PT "6 Clicks" Mobility   Outcome Measure  Help needed turning from your back to your side while in a flat bed without using bedrails?: None Help needed moving from lying on your back to sitting on the side of a flat bed without using bedrails?: None Help needed moving to and from a bed to a chair (including a wheelchair)?: A Little Help needed standing up from a chair using your arms (e.g., wheelchair or bedside chair)?: A Little Help needed to walk in hospital room?: A Little Help needed climbing 3-5 steps with a railing? : A Little 6 Click Score: 20    End of Session   Activity Tolerance: Patient tolerated treatment well Patient left: in bed;with call bell/phone within reach;with family/visitor present   PT Visit Diagnosis: Unsteadiness on feet (R26.81);Muscle weakness (generalized) (M62.81)     Time: 1638-4665 PT Time Calculation (min) (  ACUTE ONLY): 10 min  Charges:  $Gait Training: 8-22 mins                     Arlyce Dice, DPT Acute Rehabilitation Services Pager: [phone] Office: [phone]  Trena Platt 02/22/2020, 3:41 PM

## 2020-02-22 NOTE — Progress Notes (Signed)
Patient had an asymptomatic 9 beat run of SVT. Patient stated he just used the bathroom. No C/O chest pain. Will make on call aware. Vss are: 99/55, 66 hr, 17 rr, 97% on RA. Will continue to monitor.

## 2020-02-22 NOTE — Progress Notes (Signed)
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  Monitor fever curve continue antibiotics  Assessment & Plan:   Principal Problem:   Aspiration pneumonia of left lower lobe due to gastric secretions (HCC) Active Problems:   Mixed hyperlipidemia   Severe protein-calorie malnutrition (HCC)   Coronary artery disease   COPD with chronic bronchitis and emphysema (HCC)   Pulmonary infarct (HCC)   Squamous cell carcinoma of lung, stage IV (HCC)   GERD without esophagitis   Left lower lobe pneumonia  Left Lower Lobe PNA SIRS Pulmonary infarct     - Patient presenting with 3-day history of generalized malaise, weakness, chills fevers and increasing severity of cough     - CT imaging abdomen pelvis incidentally found left lower lobe pneumonia, possibly secondary to aspiration with adjacent pulmonary infarct.     - CTA chest w/o evidence of PE     - Patient exhibiting fevers and tachypnea in the emergency department suggestive of SIRS without evidence of organ failure      - MRSA swab negative, hold vanc; continue cefepime     - Bld Cx NTD     - continue fluids.      - some concern for possible dysphagia; consult SLP suggesting regular diet     -  Continue cefepime. SLP has reviewed. No evidence of dysphagia: regular/thins ordered;  Microscopic hematuria     - Based on urinalysis     - Likely caused by  nephrolithiasis which patient has a longstanding history of No evidence of obstructive stones on CT imaging of the abdomen pelvis.     - Outpatient follow-up  Severe protein-calorie malnutrition ([place])     - History of poor oral intake with evidence of muscle wasting on examination     - Body mass index is 17.02 kg/m.     - nutrition recs noted, appreciate assistance     - continue dronabinol  Coronary artery disease     - denies CP     - EKG: NSR w/ RBBB and LAFB     - continue lipitor, plavix, metoprolol  COPD with chronic bronchitis and emphysema (HCC)     - O2 as needed     - continue breo ellipta and incruse ellipta  Squamous cell carcinoma of lung, stage IV ([place])     - Known extensive disease with metastases to thoracic spine and ribs     - Patient follows with Dr. [name] with Oncology     - Patient is status post palliative radiation therapy of the left upper lobe in February 2020     - Patient is status post complete course of carboplatin, Taxol in June 2020     - Patient currently on pembrolizumab     - Continue outpatient follow-up     - Dr [name] is aware pt is inpt  Mixed hyperlipidemia     - continue atorvastatin  GERD without esophagitis     -  continue protonix, TUMS  Normocytic anemia     - no evidence of bleed, follow  Generalized weakness     - PT rec's HHPT, RW will order  DVT prophylaxis: lovenox Code Status: FULL Family Communication: with wife by phone   Status is: Inpatient  Remains inpatient appropriate because:Inpatient level of care appropriate due to severity of illness   Dispo: The patient is from: Home              Anticipated d/c is to: Home              Anticipated d/c date is: 3 days              Patient currently is not medically stable to d/c.  Consultants:   None  Procedures:   None  Antimicrobials:  . Cefepime   ROS:  Reports D. Denies ab pain, N, V, HA, CP . Remainder 10-pt ROS is negative for all not previously  mentioned.  Subjective: No nausea no vomiting.  No fever no chills.  No chest pain abdomen.  Still has cough.  Still feeling fatigue.  No confusion reported by wife at bedside.  Objective: Vitals:   02/22/20 0806 02/22/20 1015 02/22/20 1300 02/22/20 1823  BP:  (!) 95/49 119/62 105/60  Pulse: 68 (!) 59 61 64  Resp: 20 16 16 18   Temp:  98 F (36.7 C) 98.5 F (36.9 C) 97.8 F (36.6 C)  TempSrc:  Oral Oral Oral  SpO2: 97% 99% 98% 98%  Weight:      Height:        Intake/Output Summary (Last 24 hours) at 02/22/2020 2004 Last data filed at 02/22/2020 1700 Gross per 24 hour  Intake 1520 ml  Output 400 ml  Net 1120 ml   Filed Weights   02/19/20 1520 02/19/20 2306  Weight: 55.3 kg 54.5 kg    Examination:  General: 74 y.o. male resting in bed in NAD Cardiovascular: RRR, +S1, S2, no m/g/r Respiratory: left base improving , no w/r/r, normal WOB GI: BS+, NDNT, no masses noted, no organomegaly noted MSK: No e/c/c Neuro: alert to name, follows commands Psyc: Appropriate interaction and affect, calm/cooperative   Data Reviewed: I have personally reviewed following labs and imaging studies.  CBC: Recent Labs  Lab 02/19/20 1603 02/20/20 0520 02/21/20 0518  WBC 10.1 10.9* 9.2  NEUTROABS  --  9.8* 8.3*  HGB 13.3 10.9* 11.5*  HCT 39.9 31.4* 33.8*  MCV 98.8 96.6 96.6  PLT 126* 116* 213*   Basic Metabolic Panel: Recent Labs  Lab 02/19/20 1603 02/20/20 0520 02/21/20 0518 02/22/20 0931  NA 136 136 133* 134*  K 4.7 3.5 3.3* 3.3*  CL 99 102 101 101  CO2 26 24 23 25   GLUCOSE 121* 103* 107* 150*  BUN 24* 19 23 27*  CREATININE 1.09 0.85 0.90 0.87  CALCIUM 8.8* 7.8* 7.7* 8.3*  MG  --  1.8 1.9  --    GFR: Estimated Creatinine Clearance: 59.2 mL/min (by C-G formula based on SCr of 0.87 mg/dL). Liver Function Tests: Recent Labs  Lab 02/19/20 1603 02/20/20 0520 02/21/20 0518  AST 26 22 29   ALT 20 20 22   ALKPHOS 54 40 37*  BILITOT 0.7 0.7 0.7  PROT 7.3 5.7* 5.7*    ALBUMIN 3.7 2.9* 2.7*   No results for input(s): LIPASE, AMYLASE in the last 168 hours. No results for input(s): AMMONIA in the last 168 hours. Coagulation Profile: Recent Labs  Lab 02/19/20 1603  INR 1.1   Cardiac Enzymes: No results for input(s): CKTOTAL, CKMB, CKMBINDEX, TROPONINI in the last 168 hours. BNP (last 3 results) No results for input(s): PROBNP in the last 8760 hours. HbA1C: No results for input(s): HGBA1C in the last 72 hours. CBG: No results for input(s): GLUCAP in the last 168 hours. Lipid Profile: No results for input(s): CHOL, HDL, LDLCALC, TRIG, CHOLHDL, LDLDIRECT in the last 72 hours. Thyroid Function Tests: No results for input(s): TSH, T4TOTAL, FREET4, T3FREE, THYROIDAB in the last 72 hours. Anemia Panel: No results for input(s): VITAMINB12, FOLATE, FERRITIN, TIBC, IRON, RETICCTPCT in the last 72 hours. Sepsis Labs: Recent Labs  Lab 02/19/20 1603  LATICACIDVEN 1.5    Recent Results (from the past 240 hour(s))  Urine culture     Status: None   Collection Time: 02/19/20  3:24 PM   Specimen: Urine, Clean Catch  Result Value Ref Range Status   Specimen Description   Final    URINE, CLEAN CATCH Performed at [facility], [address] [place], [place] [address]    Special Requests   Final    NONE Performed at [facility], [address] [place], [place] [address]    Culture   Final    NO GROWTH Performed at [facility], [place] [address] [place], [place] [address]    Report Status 02/21/2020 FINAL  Final  Culture, blood (Routine x 2)     Status: None (Preliminary result)   Collection Time: 02/19/20  4:03 PM   Specimen: BLOOD RIGHT FOREARM  Result Value Ref Range Status   Specimen Description   Final    BLOOD RIGHT FOREARM Performed at [facility], [address] [place], [place] [address]    Special Requests   Final    BOTTLES DRAWN AEROBIC AND ANAEROBIC Blood Culture adequate volume Performed  at [facility], [address] [place], [place] [address]    Culture   Final    NO GROWTH 3 DAYS Performed at [facility], [place] [address] [place], [place] [address]    Report Status PENDING  Incomplete  Culture, blood (Routine x 2)     Status: None (Preliminary result)   Collection Time: 02/19/20  4:47 PM   Specimen: BLOOD RIGHT FOREARM  Result Value Ref Range Status   Specimen Description   Final    BLOOD RIGHT FOREARM Performed at [facility], [place] [address] [place], [place] [address]    Special Requests   Final    BOTTLES DRAWN AEROBIC AND ANAEROBIC Blood Culture adequate volume Performed at [facility], [address] [place], [place] [address]    Culture   Final    NO GROWTH 3 DAYS Performed at [facility], [place] [address] [place], [place] [address]    Report Status PENDING  Incomplete  SARS Coronavirus 2 by RT PCR (hospital order, performed in [facility] hospital lab) Nasopharyngeal Nasopharyngeal Swab     Status: None   Collection Time: 02/19/20  4:49 PM   Specimen: Nasopharyngeal Swab  Result Value Ref Range Status   SARS Coronavirus 2 NEGATIVE NEGATIVE Final    Comment: (NOTE) SARS-CoV-2 target nucleic acids are NOT DETECTED.  The SARS-CoV-2 RNA is generally detectable in upper and lower respiratory specimens during the acute phase of infection. The lowest concentration of SARS-CoV-2 viral copies this assay can detect is 250 copies /  mL. A negative result does not preclude SARS-CoV-2 infection and should not be used as the sole basis for treatment or other patient management decisions.  A negative result may occur with improper specimen collection / handling, submission of specimen other than nasopharyngeal swab, presence of viral mutation(s) within the areas targeted by this assay, and inadequate number of viral copies (<250 copies / mL). A negative result must be combined with clinical observations, patient  history, and epidemiological information.  Fact Sheet for Patients:   StrictlyIdeas.no  Fact Sheet for Healthcare Providers: BankingDealers.co.za  This test is not yet approved or  cleared by the [place] FDA and has been authorized for detection and/or diagnosis of SARS-CoV-2 by FDA under an Emergency Use Authorization (EUA).  This EUA will remain in effect (meaning this test can be used) for the duration of the COVID-19 declaration under Section 564(b)(1) of the Act, 21 U.S.C. section 360bbb-3(b)(1), unless the authorization is terminated or revoked sooner.  Performed at [facility], [address] [place], [place] [address]   MRSA PCR Screening     Status: None   Collection Time: 02/19/20 11:25 PM   Specimen: Nasopharyngeal  Result Value Ref Range Status   MRSA by PCR NEGATIVE NEGATIVE Final    Comment:        The GeneXpert MRSA Assay (FDA approved for NASAL specimens only), is one component of a comprehensive MRSA colonization surveillance program. It is not intended to diagnose MRSA infection nor to guide or monitor treatment for MRSA infections. Performed at [facility], [place] [address], [place], [place] [address]   Culture, sputum-assessment     Status: None   Collection Time: 02/20/20 12:50 AM   Specimen: Sputum  Result Value Ref Range Status   Specimen Description SPUTUM  Final   Special Requests NONE  Final   Sputum evaluation   Final    Sputum specimen not acceptable for testing.  Please recollect.   Notified Ora[place],RN @ 0981 02/20/20 SJT Performed at [facility], [place] [address] [place], [place] [address]    Report Status 02/20/2020 FINAL  Final  Expectorated sputum assessment w rflx to resp cult     Status: None   Collection Time: 02/20/20 11:50 AM   Specimen: Expectorated Sputum  Result Value Ref Range Status   Specimen Description EXPECTORATED  SPUTUM  Final   Special Requests NONE  Final   Sputum evaluation   Final    THIS SPECIMEN IS ACCEPTABLE FOR SPUTUM CULTURE Performed at [facility], [place] [address] [place], [place] [address]    Report Status 02/22/2020 FINAL  Final      Radiology Studies: No results found.   Scheduled Meds: . atorvastatin  80 mg Oral q1800  . calcium carbonate  1 tablet Oral TID WC  . clopidogrel  75 mg Oral Daily  . dronabinol  5 mg Oral BID AC  . DULoxetine  30 mg Oral Daily  . enoxaparin (LOVENOX) injection  40 mg Subcutaneous QHS  . feeding supplement (ENSURE ENLIVE)  237 mL Oral TID BM  . fentaNYL  1 patch Transdermal Q72H  . fluticasone furoate-vilanterol  1 puff Inhalation Daily   And  . umeclidinium bromide  1 puff Inhalation Daily  . nicotine  7 mg Transdermal Daily  . pantoprazole  40 mg Oral Daily  . polyvinyl alcohol  1 drop Both Eyes QHS  . potassium chloride  20 mEq Oral BID  Continuous Infusions: . sodium chloride 250 mL (02/19/20 1715)  . sodium chloride 500 mL (02/19/20 1934)  . ceFEPime (MAXIPIME) IV 2 g (02/22/20 1500)     LOS: 2 days    Time spent: 25 minutes spent in the coordination of care today.    Berle Mull,  [facility]  If 7PM-7AM, please contact night-coverage [email or web address] 02/22/2020, 8:04 PM

## 2020-02-22 NOTE — Plan of Care (Signed)
  Problem: Respiratory: Goal: Ability to maintain adequate ventilation will improve Outcome: Adequate for Discharge Goal: Ability to maintain a clear airway will improve Outcome: Adequate for Discharge   Problem: Education: Goal: Knowledge of General Education information will improve Description: Including pain rating scale, medication(s)/side effects and non-pharmacologic comfort measures Outcome: Adequate for Discharge

## 2020-02-23 LAB — BASIC METABOLIC PANEL
Anion gap: 9 (ref 5–15)
BUN: 21 mg/dL (ref 8–23)
CO2: 24 mmol/L (ref 22–32)
Calcium: 8.1 mg/dL — ABNORMAL LOW (ref 8.9–10.3)
Chloride: 103 mmol/L (ref 98–111)
Creatinine, Ser: 0.75 mg/dL (ref 0.61–1.24)
GFR calc Af Amer: >60 mL/min (ref >60)
GFR calc non Af Amer: >60 mL/min (ref >60)
Glucose, Bld: 109 mg/dL — ABNORMAL HIGH (ref 70–99)
Potassium: 4 mmol/L (ref 3.5–5.1)
Sodium: 136 mmol/L (ref 135–145)

## 2020-02-23 LAB — LEGIONELLA PNEUMOPHILA SEROGP 1 UR AG: L. pneumophila Serogp 1 Ur Ag: NEGATIVE

## 2020-02-23 MED ORDER — AMOXICILLIN-POT CLAVULANATE 875-125 MG PO TABS
1.0000 | ORAL_TABLET | Freq: Two times a day (BID) | ORAL | Status: DC
  Administered 2020-02-23: 1 via ORAL
  Filled 2020-02-23: qty 1

## 2020-02-23 MED ORDER — NICOTINE 7 MG/24HR TD PT24: 7.0000 mg | MEDICATED_PATCH | Freq: Every day | TRANSDERMAL | 0 refills | Status: DC

## 2020-02-23 MED ORDER — AMOXICILLIN-POT CLAVULANATE 875-125 MG PO TABS: 1.0000 | ORAL_TABLET | Freq: Two times a day (BID) | ORAL | 0 refills | Status: AC

## 2020-02-23 NOTE — Care Management Important Message (Signed)
Important Message  Patient Details IM Letter given to Evette Cristal SW Case Manager to present to the Patient Name: Kyle Foster MRN: [id number] Date of Birth: 06-14-1946   Medicare Important Message Given:  Yes     Kerin Salen 02/23/2020, 10:32 AM

## 2020-02-23 NOTE — TOC Transition Note (Signed)
Transition of Care [facility]) - CM/SW Discharge Note   Patient Details  Name: Kyle Foster MRN: [id number] Date of Birth: 04-02-1946  [facility] CM/SW Contact:  Ross Ludwig, LCSW Phone Number: 02/23/2020, 8:02 PM   Clinical Narrative:     Patient will be going home with home health through [facility].  CSW signing off please reconsult with any other social work needs, home health agency has been notified of planned discharge.    Final next level of care: [place] Barriers to Discharge: Barriers Resolved   Patient Goals and CMS Choice Patient states their goals for this hospitalization and ongoing recovery are:: To return home with home health. CMS Medicare.gov Compare Post Acute Care list provided to:: Patient Represenative (must comment) Choice offered to / list presented to : Spouse  Discharge Placement                       Discharge Plan and Services     Post Acute Care Choice: Home Health          DME Arranged: [place] rolling DME Agency: [employer] Date DME Agency Contacted: 02/22/20 Time DME Agency Contacted: 1500 Representative spoke with at DME Agency: [place]: RN, PT, OT, Nurse's Aide, Social Work [employer] Agency: [place] Date Register: 02/22/20 Time [place]: 1500 Representative spoke with at [place]: [place] ([place]) Interventions     Readmission Risk Interventions Readmission Risk Prevention Plan 02/22/2020  Transportation Screening Complete  HRI or [place] Complete  Social Work Consult for [place] Planning/Counseling [place] Screening Complete  Medication Review Press photographer) Referral to Pharmacy  Some recent data might be hidden

## 2020-02-24 ENCOUNTER — Other Ambulatory Visit: Payer: Self-pay | Admitting: Internal Medicine

## 2020-02-24 LAB — CULTURE, BLOOD (ROUTINE X 2)
Culture: NO GROWTH
Culture: NO GROWTH
Special Requests: ADEQUATE
Special Requests: ADEQUATE

## 2020-02-24 LAB — CULTURE, RESPIRATORY W GRAM STAIN

## 2020-02-24 MED ORDER — FLUCONAZOLE 100 MG PO TABS
100.0000 mg | ORAL_TABLET | Freq: Every day | ORAL | 0 refills | Status: AC
Start: 2020-02-24 — End: 2020-03-09

## 2020-02-24 NOTE — Progress Notes (Signed)
Sputum culture has candida spp. Diflucan prescribed.

## 2020-02-26 NOTE — Discharge Summary (Signed)
[facility] Discharge Summary   Patient: Kyle Foster ZOX:[id number]  PCP: Brunetta Jeans, PA-C  Date of admission: 02/19/2020   Date of discharge: 02/23/2020      Discharge Diagnoses:  Principal Problem:   Aspiration pneumonia of left lower lobe due to gastric secretions [facility]) Active Problems:   Mixed hyperlipidemia   Severe protein-calorie malnutrition ([place])   Coronary artery disease   COPD with chronic bronchitis and emphysema ([place])   Pulmonary infarct ([place])   Squamous cell carcinoma of lung, stage IV ([place])   GERD without esophagitis   Left lower lobe pneumonia   Admitted From: Home Disposition:  Home   Recommendations for Outpatient Follow-up:  1. PCP: Follow-up with PCP in 1 week 2. Follow up LABS/TEST: None   Follow-up Information    Brunetta Jeans, PA-C. Schedule an appointment as soon as possible for a visit in 1 week(s).   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 Sepsis POA secondary to Left Lower Lobe PNA Patient presenting with 3-day history of  generalized malaise, weakness, chills fevers and increasing severity of cough CT imaging abdomen pelvis incidentally found left lower lobe pneumonia, possibly secondary to aspiration with adjacent pulmonary infarct. CTA chest w/o evidence of PE Treated with IV vancomycin and cefepime. MRSA PCR was negative therefore vancomycin was discontinued. Transition to oral Omnicef tolerating very well without any fever for last 24 hours. Bld Cx NTD SLP suggesting regular diet  Microscopic hematuria Based on urinalysis Likely caused by nephrolithiasis which patient has a longstanding history of No evidence of obstructive stones on CT imaging of the abdomen pelvis. Outpatient follow-up  Coronary artery disease denies CP EKG: NSR w/ RBBB and LAFB continue lipitor, plavix, metoprolol  COPD with chronic bronchitis and emphysema ([place]) O2 as needed continue breo ellipta and incruse ellipta  Squamous cell carcinoma of lung, stage IV (H[facility]) Known extensive disease with metastases to thoracic spine and ribs Patient follows with Dr. [name] with Oncology Patient is status post palliative radiation therapy of the left upper lobe in February 2020 Patient is status post complete course of carboplatin, Taxol in June 2020 Patient currently on pembrolizumab Continue outpatient follow-up  Mixed hyperlipidemia Continue atorvastatin Given patient's severe protein calorie malnutrition consider utility of statin on an ongoing basis.  GERD without esophagitis continue protonix, TUMS  Normocytic anemia no evidence of bleed, follow  Severe protein calorie malnutrition in the setting of cancer Continue Marinol. Body mass index is 16.76 kg/m.  Nutrition Problem: Severe Malnutrition Etiology: chronic illness, cancer and cancer related treatments Nutrition Interventions: Interventions: Ensure Enlive (each supplement provides 350kcal and  20 grams of protein), Carnation Instant Breakfast  Patient was  seen by physical therapy, who recommended Home health, which was arranged. On the day of the discharge the patient's vitals were stable, and no other acute medical condition were reported by patient. the patient was felt safe to be discharge at Home with Home health.  Consultants: none Procedures: none  Discharge Exam: General: Appear in no distress, no Rash; Oral Mucosa Clear, moist. Cardiovascular: S1 and S2 Present, no Murmur, Respiratory: normal respiratory effort, Bilateral Air entry present and bilateral  Crackles, no wheezes Abdomen: Bowel Sound present, Soft and no tenderness, no hernia Extremities: no Pedal edema, no calf tenderness Neurology: alert and oriented to time, place, and person affect appropriate.  Filed Weights   02/19/20 1520 02/19/20 2306  Weight: 55.3 kg 54.5 kg   Vitals:   02/23/20 1311 02/23/20 1329  BP: (!) 105/59   Pulse: 72   Resp: (!) 26 20  Temp: 98.2 F (36.8 C)   SpO2: 100%     DISCHARGE MEDICATION: Allergies as of 02/23/2020      Reactions   Chlorhexidine    Codeine Other (See Comments)   HEADACHE headaches      Medication List    TAKE these medications   amoxicillin-clavulanate 875-125 MG tablet Commonly known as: AUGMENTIN Take 1 tablet by mouth 2 (two) times daily for 3 days.   ARTIFICIAL TEARS OP Place 1 drop into both eyes at bedtime.   atorvastatin 80 MG tablet Commonly known as: LIPITOR Take 1 tablet (80 mg total) by mouth daily at 6 PM.   calcium carbonate 500 MG chewable tablet Commonly known as: TUMS - dosed in mg elemental calcium Chew 1 tablet (200 mg of elemental calcium total) by mouth 3 (three) times daily with meals. What changed:   when to take this  reasons to take this   clopidogrel 75 MG tablet Commonly known as: PLAVIX Take 1 tablet (75 mg total) by mouth daily.   Denta 5000 Plus 1.1 % Crea dental cream Generic drug: sodium fluoride Place 1 application onto teeth See admin instructions.     dronabinol 5 MG capsule Commonly known as: MARINOL Take 1 capsule (5 mg total) by mouth 2 (two) times daily before lunch and supper. What changed: when to take this   DULoxetine 30 MG capsule Commonly known as: CYMBALTA TAKE 1 CAPSULE BY MOUTH EVERY DAY   fentaNYL 50 MCG/HR Commonly known as: [place] 1 patch onto the skin every 3 (three) days.   lidocaine-prilocaine cream Commonly known as: EMLA Apply 1 application topically as needed. What changed: reasons to take this   LORazepam 0.5 MG tablet Commonly known as: Ativan Take 1 tablet (0.5 mg total) by mouth every 6 (six) hours as needed (Nausea or vomiting).   magnesium oxide 400 MG tablet Commonly known as: MAG-OX Take 1 tablet (400 mg total) by mouth daily.   metoprolol tartrate 25 MG tablet Commonly known as: LOPRESSOR Take 1 tablet (25 mg total) by mouth 2 (two) times daily.   morphine 15 MG tablet Commonly known as: MSIR Take 1-2 tablets (15-30 mg total) by mouth every 4 (four) hours as needed for moderate pain or severe pain.   nicotine 7 mg/24hr patch Commonly known as: NICODERM CQ - dosed in mg/24 hr Place 1 patch (7 mg total) onto the skin daily.   ondansetron 8 MG tablet Commonly known as: ZOFRAN Take 1 tablet (8 mg total) by mouth every 8 (eight) hours as  needed for nausea or vomiting.   pantoprazole 40 MG tablet Commonly known as: PROTONIX Take 1 tablet (40 mg total) by mouth daily.   Trelegy Ellipta 100-62.5-25 MCG/INH Aepb Generic drug: Fluticasone-Umeclidin-Vilant TAKE 1 PUFF BY MOUTH EVERY DAY What changed: See the new instructions.   vitamin B-12 100 MCG tablet Commonly known as: CYANOCOBALAMIN Take 100 mcg by mouth in the morning and at bedtime.   Vitamin D 50 MCG (2000 UT) tablet Take 2,000 Units by mouth 2 (two) times daily.      Allergies  Allergen Reactions  . Chlorhexidine   . Codeine Other (See Comments)    HEADACHE  headaches   Discharge Instructions    Diet -  low sodium heart healthy   Complete by: As directed    Increase activity slowly   Complete by: As directed       The results of significant diagnostics from this hospitalization (including imaging, microbiology, ancillary and laboratory) are listed below for reference.    Significant Diagnostic Studies: DG Chest 2 View  Result Date: 02/19/2020 CLINICAL DATA:  Suspected sepsis. Weakness and fever. Diminished appetite. Radiologic records state non-small cell lung cancer. EXAM: CHEST - 2 VIEW COMPARISON:  Chest CT last month 02/02/2020. FINDINGS: Right chest port with tip in the SVC. Stable scarring in the suprahilar left lung, corresponding to treated malignancy on recent CT. Mild chronic left lung volume loss. Underlying emphysema with hyperinflation. No acute or focal airspace disease. No significant pleural effusion. No pneumothorax. Bony destruction involving posterior left fifth and sixth ribs involving the adjacent vertebral body, not well demonstrated by radiograph. IMPRESSION: 1. No acute chest findings. 2. Stable scarring in the suprahilar left lung, corresponding to treated malignancy on recent CT. 3. Hyperinflation and bronchial thickening consistent with COPD. Electronically Signed   By: Keith Rake M.D.   On: 02/19/2020 16:11   CT Chest W Contrast  Result Date: 02/02/2020 CLINICAL DATA:  74 year old male with history of non-small cell lung cancer. Evaluate for metastatic disease. EXAM: CT CHEST, ABDOMEN, AND PELVIS WITH CONTRAST TECHNIQUE: Multidetector CT imaging of the chest, abdomen and pelvis was performed following the standard protocol during bolus administration of intravenous contrast. CONTRAST:  116mL OMNIPAQUE IOHEXOL 300 MG/ML  SOLN COMPARISON:  Chest CT 09/28/2019 and multiple older prior examinations. CT the abdomen and pelvis 02/09/2019 and multiple older prior examinations. FINDINGS: CT CHEST FINDINGS Cardiovascular: Heart size is normal. There is no significant  pericardial fluid, thickening or pericardial calcification. There is aortic atherosclerosis, as well as atherosclerosis of the great vessels of the mediastinum and the coronary arteries, including calcified atherosclerotic plaque in the left anterior descending, left circumflex and right coronary arteries. Severe thickening calcification of the aortic valve. Right internal jugular single-lumen porta cath with tip terminating in the right atrium. Mediastinum/Nodes: No pathologically enlarged mediastinal or hilar lymph nodes. Esophagus is unremarkable in appearance. No axillary lymphadenopathy. Lungs/Pleura: Area of architectural distortion and cavitation medial aspect of the left lung centered in the superior segment of the left lower lobe, similar to the prior study, with associated lytic lesion involving the left side of T5 and T6 vertebra, and the posterior aspects of the left fifth and sixth ribs (discussed below), similar to prior studies. No convincing area of nodularity within this region to clearly indicate recurrent/progressive disease. No other definite suspicious appearing pulmonary nodules or masses are noted. No acute consolidative airspace disease. No pleural effusions. Diffuse bronchial wall thickening with moderate centrilobular and paraseptal emphysema. Musculoskeletal: Large invasive mass  in the posteromedial aspect of the left hemithorax which again directly invades the left side of the T5 and T6 vertebra as well as the posterior left fifth and sixth ribs, similar to the prior study, measuring approximately 6.6 x 4.4 cm (axial image 40 of series 4). No other new aggressive appearing lytic or blastic lesions are noted elsewhere in the visualized portions of the skeleton. CT ABDOMEN PELVIS FINDINGS Hepatobiliary: No suspicious cystic or solid hepatic lesions. No intra or extrahepatic biliary ductal dilatation. Gallbladder is normal in appearance. Pancreas: No pancreatic mass. No pancreatic ductal  dilatation. No pancreatic or peripancreatic fluid collections or inflammatory changes. Spleen: Unremarkable. Adrenals/Urinary Tract: 9.2 x 7.8 cm low-attenuation nonenhancing lesion in the upper pole the left kidney, similar to prior studies, compatible with a large simple cyst. Other smaller subcentimeter low-attenuation lesions in both kidneys, too small to definitively characterize, but statistically likely to represent cysts. 5 mm nonobstructive calculus in the upper pole collecting system of the right kidney. Bilateral adrenal glands are normal in appearance. No hydroureteronephrosis. Urinary bladder is normal in appearance. Stomach/Bowel: Normal appearance of the stomach. No pathologic dilatation of small bowel or colon. Normal appendix. Vascular/Lymphatic: Aortic atherosclerosis, without evidence of aneurysm or dissection in the abdominal or pelvic vasculature. No lymphadenopathy noted in the abdomen or pelvis. Reproductive: Prostate gland and seminal vesicles are unremarkable in appearance. Other: No significant volume of ascites.  No pneumoperitoneum. Musculoskeletal: There are no aggressive appearing lytic or blastic lesions noted in the visualized portions of the skeleton. IMPRESSION: 1. Stable appearance of treated lesions centered in the superior segment of the left lower lobe with persistent direct invasion of the adjacent posteromedial left chest wall and vertebral column, as detailed above. No definitive findings to suggest local recurrence of disease or new metastatic disease noted on today's examination. 2. Previously identified hypovascular lesion in the superior aspect of segment 7 of the liver is not confidently identified on today's examination. 3. 5 mm nonobstructive calculus again noted in the upper pole collecting system of the right kidney. 4. Aortic atherosclerosis, in addition to 3 vessel coronary artery disease. Assessment for potential risk factor modification, dietary therapy or  pharmacologic therapy may be warranted, if clinically indicated. 5. Diffuse bronchial wall thickening with moderate centrilobular and paraseptal emphysema; imaging findings suggestive of underlying COPD. 6. There are calcifications of the aortic valve. Echocardiographic correlation for evaluation of potential valvular dysfunction may be warranted if clinically indicated. 7. Additional incidental findings, as above. Electronically Signed   By: Vinnie Langton M.D.   On: 02/02/2020 15:12   CT ANGIO CHEST PE W OR WO CONTRAST  Result Date: 02/20/2020 CLINICAL DATA:  Pneumonia.  Evaluate for pulmonary embolus. EXAM: CT ANGIOGRAPHY CHEST WITH CONTRAST TECHNIQUE: Multidetector CT imaging of the chest was performed using the standard protocol during bolus administration of intravenous contrast. Multiplanar CT image reconstructions and MIPs were obtained to evaluate the vascular anatomy. CONTRAST:  164mL OMNIPAQUE IOHEXOL 350 MG/ML SOLN COMPARISON:  02/02/2020 FINDINGS: Cardiovascular: No filling defects in the pulmonary arteries to suggest acute pulmonary embolus. 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 Heart is normal size. Diffuse coronary artery calcifications and aortic atherosclerosis. No aneurysm. Mediastinum/Nodes: No mediastinal, hilar, or axillary adenopathy. Lungs/Pleura: Area of architectural distortion and cavitation again seen in the superior segment of the left lower lobe, stable. Abnormal bronchial wall thickening in the left mainstem bronchus and left lower lobe. Airspace disease in the left lower lobe  compatible with pneumonia. Small left effusion. Biapical scarring. Moderate centrilobular and paraseptal emphysema. Upper Abdomen: Large cyst in the upper pole of the left kidney, unchanged. No acute findings. Musculoskeletal: Large lytic destructive process seen within the T5 and T6 vertebral bodies as well as the adjacent medial left 5th and  6th ribs, unchanged since prior studies. Chest wall soft tissues unremarkable. Review of the MIP images confirms the above findings. IMPRESSION: No evidence of acute pulmonary embolus. 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 This may be related to tumor occlusion or post treatment. Continued architectural distortion and cavitary lesion in the superior segment of the left lower lobe, unchanged. Continued peribronchial thickening. New airspace disease in the left lower lobe with small left effusion, most compatible with pneumonia. Stable destructive lytic lesion within the T5 and T6 vertebral body and adjacent posterior left ribs. Aortic Atherosclerosis (ICD10-I70.0) and Emphysema (ICD10-J43.9). Electronically Signed   By: Rolm Baptise M.D.   On: 02/20/2020 02:10   CT Abdomen Pelvis W Contrast  Result Date: 02/02/2020 CLINICAL DATA:  74 year old male with history of non-small cell lung cancer. Evaluate for metastatic disease. EXAM: CT CHEST, ABDOMEN, AND PELVIS WITH CONTRAST TECHNIQUE: Multidetector CT imaging of the chest, abdomen and pelvis was performed following the standard protocol during bolus administration of intravenous contrast. CONTRAST:  179mL OMNIPAQUE IOHEXOL 300 MG/ML  SOLN COMPARISON:  Chest CT 09/28/2019 and multiple older prior examinations. CT the abdomen and pelvis 02/09/2019 and multiple older prior examinations. FINDINGS: CT CHEST FINDINGS Cardiovascular: Heart size is normal. There is no significant pericardial fluid, thickening or pericardial calcification. There is aortic atherosclerosis, as well as atherosclerosis of the great vessels of the mediastinum and the coronary arteries, including calcified atherosclerotic plaque in the left anterior descending, left circumflex and right coronary arteries. Severe thickening calcification of the aortic valve. Right internal jugular single-lumen porta cath with tip terminating in the right atrium.  Mediastinum/Nodes: No pathologically enlarged mediastinal or hilar lymph nodes. Esophagus is unremarkable in appearance. No axillary lymphadenopathy. Lungs/Pleura: Area of architectural distortion and cavitation medial aspect of the left lung centered in the superior segment of the left lower lobe, similar to the prior study, with associated lytic lesion involving the left side of T5 and T6 vertebra, and the posterior aspects of the left fifth and sixth ribs (discussed below), similar to prior studies. No convincing area of nodularity within this region to clearly indicate recurrent/progressive disease. No other definite suspicious appearing pulmonary nodules or masses are noted. No acute consolidative airspace disease. No pleural effusions. Diffuse bronchial wall thickening with moderate centrilobular and paraseptal emphysema. Musculoskeletal: Large invasive mass in the posteromedial aspect of the left hemithorax which again directly invades the left side of the T5 and T6 vertebra as well as the posterior left fifth and sixth ribs, similar to the prior study, measuring approximately 6.6 x 4.4 cm (axial image 40 of series 4). No other new aggressive appearing lytic or blastic lesions are noted elsewhere in the visualized portions of the skeleton. CT ABDOMEN PELVIS FINDINGS Hepatobiliary: No suspicious cystic or solid hepatic lesions. No intra or extrahepatic biliary ductal dilatation. Gallbladder is normal in appearance. Pancreas: No pancreatic mass. No pancreatic ductal dilatation. No pancreatic or peripancreatic fluid collections or inflammatory changes. Spleen: Unremarkable. Adrenals/Urinary Tract: 9.2 x 7.8 cm low-attenuation nonenhancing lesion in the upper pole the left kidney, similar to prior studies, compatible with a large simple cyst. Other smaller subcentimeter low-attenuation lesions in both kidneys,  too small to definitively characterize, but statistically likely to represent cysts. 5 mm nonobstructive  calculus in the upper pole collecting system of the right kidney. Bilateral adrenal glands are normal in appearance. No hydroureteronephrosis. Urinary bladder is normal in appearance. Stomach/Bowel: Normal appearance of the stomach. No pathologic dilatation of small bowel or colon. Normal appendix. Vascular/Lymphatic: Aortic atherosclerosis, without evidence of aneurysm or dissection in the abdominal or pelvic vasculature. No lymphadenopathy noted in the abdomen or pelvis. Reproductive: Prostate gland and seminal vesicles are unremarkable in appearance. Other: No significant volume of ascites.  No pneumoperitoneum. Musculoskeletal: There are no aggressive appearing lytic or blastic lesions noted in the visualized portions of the skeleton. IMPRESSION: 1. Stable appearance of treated lesions centered in the superior segment of the left lower lobe with persistent direct invasion of the adjacent posteromedial left chest wall and vertebral column, as detailed above. No definitive findings to suggest local recurrence of disease or new metastatic disease noted on today's examination. 2. Previously identified hypovascular lesion in the superior aspect of segment 7 of the liver is not confidently identified on today's examination. 3. 5 mm nonobstructive calculus again noted in the upper pole collecting system of the right kidney. 4. Aortic atherosclerosis, in addition to 3 vessel coronary artery disease. Assessment for potential risk factor modification, dietary therapy or pharmacologic therapy may be warranted, if clinically indicated. 5. Diffuse bronchial wall thickening with moderate centrilobular and paraseptal emphysema; imaging findings suggestive of underlying COPD. 6. There are calcifications of the aortic valve. Echocardiographic correlation for evaluation of potential valvular dysfunction may be warranted if clinically indicated. 7. Additional incidental findings, as above. Electronically Signed   By: Vinnie Langton M.D.   On: 02/02/2020 15:12   CT Renal Stone Study  Result Date: 02/19/2020 CLINICAL DATA:  Hematuria of unknown cause, history of fever and hematuria EXAM: CT ABDOMEN AND PELVIS WITHOUT CONTRAST TECHNIQUE: Multidetector CT imaging of the abdomen and pelvis was performed following the standard protocol without IV contrast. COMPARISON:  02/02/2020 FINDINGS: Lower chest: Signs of interstitial and airspace disease in the dependent LEFT chest mild elevation of the LEFT hemidiaphragm. Elevation of the LEFT hemidiaphragm is new compared to the prior study. Small LEFT pleural effusion associated with process at LEFT lung base. Signs of pulmonary emphysema hemidiaphragm Hepatobiliary: Liver normal size and contour. No pericholecystic stranding. Cholelithiasis. Pancreas: Pancreas not clearly visualized. No signs of peripancreatic inflammation to the extent evaluated. Spleen: Spleen normal size and contour. Adrenals/Urinary Tract: Stable appearance of large LEFT renal cyst extending medially from the LEFT kidney measuring approximately 8.5 x 6.8 cm. Nephrolithiasis upper pole of the RIGHT kidney is similar to the prior study. This measures approximately 6 mm. Mild perinephric stranding on the LEFT. Stomach/Bowel: Limited assessment of gastrointestinal tract due to very limited intra-abdominal and mesenteric fat. Formed stool in the descending colon. No signs of free air. Scattered gas-filled loops of bowel throughout the abdomen. Vascular/Lymphatic: Calcific atheromatous plaque throughout the abdominal aorta. No gross adenopathy. No aneurysmal dilation. No pelvic adenopathy. Reproductive: Prostate grossly normal. Other: No free air.  No ascites. Musculoskeletal: Spinal degenerative changes. No acute or destructive bone process. IMPRESSION: 1. Signs of interstitial and airspace disease in the dependent LEFT chest with mild elevation of the LEFT hemidiaphragm. Small LEFT pleural effusion associated with process at  the LEFT lung base. Pneumonia perhaps due to aspiration. Given mixed density the possibility of pulmonary infarct is also considered. 2. Stable appearance of large LEFT renal cyst. Mild bilateral perinephric stranding,  correlate with urine studies. 3. Cholelithiasis without evidence of acute cholecystitis. 4. Nephrolithiasis. 5. Limited assessment of gastrointestinal tract due to very limited intra-abdominal and mesenteric fat. 6. Emphysema and aortic atherosclerosis. These results were called by telephone at the time of interpretation on 02/19/2020 at 6:48 pm to provider JOSHUA LONG , who verbally acknowledged these results. Aortic Atherosclerosis (ICD10-I70.0) and Emphysema (ICD10-J43.9). Electronically Signed   By: Zetta Bills M.D.   On: 02/19/2020 18:48    Microbiology: Recent Results (from the past 240 hour(s))  Urine culture     Status: None   Collection Time: 02/19/20  3:24 PM   Specimen: Urine, Clean Catch  Result Value Ref Range Status   Specimen Description   Final    URINE, CLEAN CATCH Performed at [facility], [address] [place], [place] [address]    Special Requests   Final    NONE Performed at [facility], [address] [place], [place] [address]    Culture   Final    NO GROWTH Performed at [facility], [place] [address] [place], [place] [address]    Report Status 02/21/2020 FINAL  Final  Culture, blood (Routine x 2)     Status: None   Collection Time: 02/19/20  4:03 PM   Specimen: BLOOD RIGHT FOREARM  Result Value Ref Range Status   Specimen Description   Final    BLOOD RIGHT FOREARM Performed at [facility], [address] [place], [place] [address]    Special Requests   Final    BOTTLES DRAWN AEROBIC AND ANAEROBIC Blood Culture adequate volume Performed at [facility], [address] [place], [place] [address]    Culture   Final    NO GROWTH 5 DAYS Performed at [facility], [place] [address], [place], [place] [address]    Report Status 02/24/2020 FINAL  Final  Culture, blood (Routine x 2)     Status: None   Collection Time: 02/19/20  4:47 PM   Specimen: BLOOD RIGHT FOREARM  Result Value Ref Range Status   Specimen Description   Final    BLOOD RIGHT FOREARM Performed at [facility], [place] [address], [place], [place] [address]    Special Requests   Final    BOTTLES DRAWN AEROBIC AND ANAEROBIC Blood Culture adequate volume Performed at [facility], [address] [place], [place] [address]    Culture   Final    NO GROWTH 5 DAYS Performed at [facility], [place] [address], [place], [place] [address]    Report Status 02/24/2020 FINAL  Final  SARS Coronavirus 2 by RT PCR (hospital order, performed in [facility] hospital lab) Nasopharyngeal Nasopharyngeal Swab     Status: None   Collection Time: 02/19/20  4:49 PM   Specimen: Nasopharyngeal Swab  Result Value Ref Range Status   SARS Coronavirus 2 NEGATIVE NEGATIVE Final    Comment: (NOTE) SARS-CoV-2 target nucleic acids are NOT DETECTED.  The SARS-CoV-2 RNA is generally detectable in upper and lower respiratory specimens during the acute phase of infection. The lowest concentration of SARS-CoV-2 viral copies this assay can detect is 250 copies / mL. A negative result does not preclude SARS-CoV-2 infection and should not be used as the sole basis for treatment or other patient management decisions.  A negative result may occur with improper specimen collection / handling, submission of specimen  other than nasopharyngeal swab, presence of viral mutation(s) within the areas targeted by this assay, and inadequate number of viral copies (<250 copies / mL). A negative result must be combined with clinical observations, patient history, and epidemiological information.  Fact Sheet for Patients:   StrictlyIdeas.no  Fact Sheet for Healthcare  Providers: BankingDealers.co.za  This test is not yet approved or  cleared by the [place] FDA and has been authorized for detection and/or diagnosis of SARS-CoV-2 by FDA under an Emergency Use Authorization (EUA).  This EUA will remain in effect (meaning this test can be used) for the duration of the COVID-19 declaration under Section 564(b)(1) of the Act, 21 U.S.C. section 360bbb-3(b)(1), unless the authorization is terminated or revoked sooner.  Performed at [facility], [address] [place], [place] [address]   MRSA PCR Screening     Status: None   Collection Time: 02/19/20 11:25 PM   Specimen: Nasopharyngeal  Result Value Ref Range Status   MRSA by PCR NEGATIVE NEGATIVE Final    Comment:        The GeneXpert MRSA Assay (FDA approved for NASAL specimens only), is one component of a comprehensive MRSA colonization surveillance program. It is not intended to diagnose MRSA infection nor to guide or monitor treatment for MRSA infections. Performed at [facility], [place] [address], [place], [place] [address]   Culture, sputum-assessment     Status: None   Collection Time: 02/20/20 12:50 AM   Specimen: Sputum  Result Value Ref Range Status   Specimen Description SPUTUM  Final   Special Requests NONE  Final   Sputum evaluation   Final    Sputum specimen not acceptable for testing.  Please recollect.   Notified Oraegdunam,RN @ [id number] 02/20/20 SJT Performed at [facility], [place] [address], [place], [place] [address]    Report Status 02/20/2020 FINAL  Final  Expectorated sputum assessment w rflx to resp cult     Status: None   Collection Time: 02/20/20 11:50 AM   Specimen: Expectorated Sputum  Result Value Ref Range Status   Specimen Description EXPECTORATED SPUTUM  Final   Special Requests NONE  Final   Sputum evaluation   Final    THIS SPECIMEN IS ACCEPTABLE FOR SPUTUM CULTURE Performed at [facility], [place] [address], [place], [place] [address]    Report Status 02/22/2020 FINAL  Final  Culture, respiratory     Status: None   Collection Time: 02/20/20 11:50 AM  Result Value Ref Range Status   Specimen Description   Final    EXPECTORATED SPUTUM Performed at [place] [address], [place], [place] [address]    Special Requests   Final    NONE Reflexed from [id number] Performed at [place] [address], [place], [place] [address]    Gram Stain   Final    FEW WBC PRESENT, PREDOMINANTLY MONONUCLEAR FEW YEAST RARE SQUAMOUS EPITHELIAL CELLS PRESENT Performed at [facility], [place] [address], [place], [place] [address]    Culture FEW CANDIDA ALBICANS  Final   Report Status 02/24/2020 FINAL  Final     Labs: CBC: Recent Labs  Lab 02/20/20 0520 02/21/20 0518  WBC 10.9* 9.2  NEUTROABS 9.8* 8.3*  HGB 10.9* 11.5*  HCT 31.4* 33.8*  MCV 96.6 96.6  PLT 116* 811*   Basic Metabolic Panel: Recent Labs  Lab 02/20/20 0520 02/21/20 0518 02/22/20 0931 02/23/20 5726  NA 136 133* 134* 136  K 3.5 3.3* 3.3* 4.0  CL 102 101 101 103  CO2 24 23 25 24   GLUCOSE 103* 107* 150* 109*  BUN 19 23 27* 21  CREATININE 0.85 0.90 0.87 0.75  CALCIUM 7.8* 7.7* 8.3* 8.1*  MG 1.8 1.9  --   --    Liver Function Tests: Recent Labs  Lab 02/20/20 0520 02/21/20 0518  AST 22 29  ALT 20 22  ALKPHOS 40 37*  BILITOT 0.7 0.7  PROT 5.7* 5.7*  ALBUMIN 2.9* 2.7*   No results for input(s): LIPASE, AMYLASE in the last 168 hours. No results for input(s): AMMONIA in the last 168 hours. Cardiac Enzymes: No results for input(s): CKTOTAL, CKMB, CKMBINDEX, TROPONINI in the last 168 hours. BNP (last 3 results) No results for input(s): BNP in the last 8760 hours. CBG: No results for input(s): GLUCAP in the last 168 hours.  Time spent: 35 minutes  Signed:  Berle Mull  [facility] 02/23/2020 4:12 PM

## 2020-02-27 ENCOUNTER — Other Ambulatory Visit: Payer: Self-pay | Admitting: *Deleted

## 2020-02-27 NOTE — Patient Outreach (Signed)
[facility]) Care Management  02/27/2020  LAMERE LIGHTNER August 05, 1946 [id number]   EMMI- GENERAL DISCHARGE-RESOLVED RED ON EMMI ALERT Day #1 Date:6/19 Red Alert Reason: No-Know who to call  OUTREACH #1 RN spoke with the pt concerning the above emmi. Pt was able to states who he would call with any issues "my doctor". RN verified the provider and if it is acute or immediate to seek assistance at the ED. Pt verbalized an understanding. RN also confirmed pt has a pending appointment on 6/24 with his provider. No other issues presented at this time.  Plan: Will closed this case with no pending issues or needs at this time. Pt is aware he will received another follow up via [place] and if there is any issues to alert at needed and another follow up call will be presented.  Raina Mina, RN Care Management Coordinator [place] Office [phone]

## 2020-02-28 DIAGNOSIS — J690 Pneumonitis due to inhalation of food and vomit: Secondary | ICD-10-CM | POA: Diagnosis not present

## 2020-02-28 DIAGNOSIS — J440 Chronic obstructive pulmonary disease with acute lower respiratory infection: Secondary | ICD-10-CM | POA: Diagnosis not present

## 2020-02-28 DIAGNOSIS — E43 Unspecified severe protein-calorie malnutrition: Secondary | ICD-10-CM | POA: Diagnosis not present

## 2020-02-28 DIAGNOSIS — C3490 Malignant neoplasm of unspecified part of unspecified bronchus or lung: Secondary | ICD-10-CM | POA: Diagnosis not present

## 2020-02-28 DIAGNOSIS — I119 Hypertensive heart disease without heart failure: Secondary | ICD-10-CM | POA: Diagnosis not present

## 2020-02-28 DIAGNOSIS — C7951 Secondary malignant neoplasm of bone: Secondary | ICD-10-CM | POA: Diagnosis not present

## 2020-02-28 DIAGNOSIS — I2510 Atherosclerotic heart disease of native coronary artery without angina pectoris: Secondary | ICD-10-CM | POA: Diagnosis not present

## 2020-02-28 NOTE — Progress Notes (Signed)
HEMATOLOGY/ONCOLOGY CLINIC NOTE  Date of Service: 02/29/2020  Patient Care Team: [name] as PCP - General (Family Medicine) Lorretta Harp, MD as PCP - Cardiology (Cardiology) Brunetta Genera, MD as Consulting Physician (Hematology) Margaretha Seeds, MD as Consulting Physician (Pulmonary Disease) [name], MD as Consulting Physician (Urology)  CHIEF COMPLAINTS/PURPOSE OF CONSULTATION:  F/u for continued management of metastatic Squamous cell lung cancer  HISTORY OF PRESENTING ILLNESS:    Kyle Foster is a wonderful 74 y.o. male who has been referred to [place] by Dr. Karie Kirks for evaluation and management of Lung Mass concerning for primary lung cancer.   he pt reports he has been having back pain on and off for about 3 months and was being worked up at the [facility] in [place] by his PCP . He reports it was being maanged as MSK pain and he was referred to PT but the symptoms got progressively worse. He presented to the ED on 10/14/18 with SOB and midsternal chest pain, neck pain, and back pain which had been constant for the last 2-3 weeks.   Of note prior to the patient's visit today, pt has had a CTA Chest completed on 10/14/18 with results revealing 5.6 cm left upper lobe lesion with chest wall and thoracic spine invasion, possible epidural extension of tumor. 2. 2.7 cm left hilar mass occluding the left lower lobe pulmonary artery branch and probably attenuating or occluding the inferior left pulmonary vein. 3. Left lower lobe pulmonary nodules possibly metastatic. 4. Negative for acute PE or thoracic aortic dissection. 5. Coronary and Aortic Atherosclerosis.  Most recent lab results (10/15/18) of CBC is as follows: all values are WNL except for RBC at 3.76, [name], [name], Glucose at 111.  He subsequently had an MRI of the T spine which showed Large cavitary mass arising in the superior aspect of the left hilum and extending into the  posteromedial aspect of the left upper lobe invading the left side of the T5 and T6 vertebra and extending into the spinal canal with a slight mass effect upon the spinal cord. There is a slight pathologic compression fracture of the T6 vertebral body. 2. Probable small metastasis in the T8 vertebral body. 3. Metastatic pulmonary nodules at the left lung base posterolaterally. 4. The mass destroys the posterior aspects of the left fifth and sixth ribs.  Patient has had a h/o localized bladder cancer s/p TURBT in 2009. 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   Interval History:   Kyle Foster returns today for management and evaluation of his Squamous Cell Carcinoma Lung Cancer and maintenance Pembrolizumab. The patient's last visit with [place] was on 01/18/2020. The pt reports that he is doing well overall.  The pt reports that he has not been coughing much but is still having significant SOB since his hospital discharge on 02/23/20. He does not require oxygen therapy at this time. He has seen a Astronomer who concluded that the pt had pneumonia that was not caused by aspiration. Pt will begin PT soon to help rebuild his strength. Pt has received his COVID19 vaccines.   Of note since the patient's last visit, pt has had CT C/A/P ([id number]) ([id number]) completed on 02/02/2020 with results revealing "1. Stable appearance of treated lesions centered in the superior segment of the left lower lobe with persistent direct invasion of the adjacent posteromedial  left chest wall and vertebral column, as detailed above. No definitive findings to suggest local recurrence of disease or new metastatic disease noted on today's examination. 2. Previously identified hypovascular lesion in the superior aspect of segment 7 of the liver is not confidently identified on today's examination. 3. 5 mm nonobstructive calculus again noted in the upper pole  collecting system of the right kidney. 4. Aortic atherosclerosis, in addition to 3 vessel coronary artery disease. Assessment for potential risk factor modification, dietary therapy or pharmacologic therapy may be warranted, if clinically indicated. 5. Diffuse bronchial wall thickening with moderate centrilobular and paraseptal emphysema; imaging findings suggestive of underlying COPD. 6. There are calcifications of the aortic valve. Echocardiographic correlation for evaluation of potential valvular dysfunction may be warranted if clinically indicated. 7. Additional incidental findings, as above."  Lab results today (02/29/20) of CBC w/diff and CMP is as follows: all values are WNL except for RBC at 3.24, Hgb at 10.7, HCT at 32.2, Neutro Abs at 8.9K, Lymphs Abs at 0.5K, Calcium at 8.7, Total Protein at 6.3, Albumin at 2.8. 02/29/2020 TSH at 2.343  On review of systems, pt reports SOB and denies leg swelling, abdominal pain, coughing and any other symptoms.   Past Medical History:  Diagnosis Date  . Allergy   . Bladder cancer ([place]) 10/2008  . CAD (coronary artery disease)   . Cancer of parotid gland ([place]) 12/2009   "squamous cell cancer attached to it; took the gland out"  . Depression   . GERD (gastroesophageal reflux disease)   . History of chickenpox   . History of kidney stones   . Hyperlipidemia   . Hypertension   . Myocardial infarction ([place]) 06/2008  . Recurrent upper respiratory infection (URI)    09/01/11 saw PCP - Kathryne Eriksson , antibiotic  and prednisone   . Skin cancer    "cut & burned off arms, hands, face, neck" (06/14/2018)  . Squamous cell carcinoma of lung ([place]) dx'd 10/2018   chemo.xrt. immunotherapy    SURGICAL HISTORY: Past Surgical History:  Procedure Laterality Date  . CATARACT EXTRACTION W/ INTRAOCULAR LENS IMPLANT Right 12/2007  . CORONARY ANGIOPLASTY WITH STENT PLACEMENT  06/2008   "3 stents" (06/14/2018)  . CYSTOSCOPY W/ RETROGRADES  09/22/2011   Procedure:  CYSTOSCOPY WITH RETROGRADE PYELOGRAM;  Surgeon: Bernestine Amass, MD;  Location: [facility];  Service: Urology;  Laterality: Left;  Cystoscopy left Retrograde Pyelogram      (c-arm)   . CYSTOSCOPY WITH BIOPSY  09/22/2011   Procedure: CYSTOSCOPY WITH BIOPSY;  Surgeon: Bernestine Amass, MD;  Location: [facility];  Service: Urology;  Laterality: N/A;   Biopsy  . EXCISIONAL HEMORRHOIDECTOMY  ~ 2006  . EYE SURGERY Left 04/2011   "reconstruction; gold weight in eye lid " (06/14/2018)  . FOOT NEUROMA SURGERY Left   . INGUINAL HERNIA REPAIR Right 02/2018  . IR IMAGING GUIDED PORT INSERTION  11/23/2018  . NM MYOCAR PERF WALL MOTION  07/11/2008   MILD ISCHEMIA IN THE BASL INFERIOR, MID INFERIOR & APICAL INFERIOR REGIONS  . 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 SKIN CANCER EXCISION     "arms, hands, face, neck" (06/14/2018)  . TRANSURETHRAL RESECTION OF BLADDER TUMOR WITH GYRUS (TURBT-GYRUS)  2010    SOCIAL HISTORY: Social History   Socioeconomic History  . Marital status: Married    Spouse name: Not on file  . Number of children: Not on file  .  Years of education: Not on file  . Highest education level: Not on file  Occupational History  . Not on file  Tobacco Use  . Smoking status: Current Every Day Smoker    Packs/day: 0.50    Years: 52.00    Pack years: 26.00    Types: Cigarettes    Start date: 38  . Smokeless tobacco: Never Used  . Tobacco comment: 03/21/19- smoking 10 cigs a day   Vaping Use  . Vaping Use: Never used  Substance and Sexual Activity  . Alcohol use: Not Currently  . Drug use: Not Currently  . Sexual activity: Not on file  Other Topics Concern  . Not on file  Social History Narrative  . Not on file   Social Determinants of Health   Financial Resource Strain:   . Difficulty of Paying Living Expenses:   Food Insecurity:   . Worried About Charity fundraiser in the Last Year:   . Arboriculturist in the Last  Year:   Transportation Needs:   . Film/video editor (Medical):   Marland Kitchen Lack of Transportation (Non-Medical):   Physical Activity:   . Days of Exercise per Week:   . Minutes of Exercise per Session:   Stress:   . Feeling of Stress :   Social Connections:   . Frequency of Communication with Friends and Family:   . Frequency of Social Gatherings with Friends and Family:   . Attends Religious Services:   . Active Member of Clubs or Organizations:   . Attends Archivist Meetings:   Marland Kitchen Marital Status:   Intimate Partner Violence:   . Fear of Current or Ex-Partner:   . Emotionally Abused:   Marland Kitchen Physically Abused:   . Sexually Abused:     FAMILY HISTORY: Family History  Problem Relation Age of Onset  . Heart attack Mother 100  . Cancer Mother        Lung  . Diabetes Mother   . Heart disease Mother   . Hypertension Mother   . Stroke Father 36  . Cancer Father        Lung  . Brain cancer Brother   . Cancer Sister        Melanoma of great Toe  . Hyperlipidemia Sister     ALLERGIES:  is allergic to chlorhexidine and codeine.  MEDICATIONS:  Current Outpatient Medications  Medication Sig Dispense Refill  . atorvastatin (LIPITOR) 80 MG tablet Take 1 tablet (80 mg total) by mouth daily at 6 PM. 90 tablet 3  . calcium carbonate (TUMS - DOSED IN MG ELEMENTAL CALCIUM) 500 MG chewable tablet Chew 1 tablet (200 mg of elemental calcium total) by mouth 3 (three) times daily with meals. (Patient taking differently: Chew 1 tablet by mouth 3 (three) times daily as needed for indigestion. ) 30 tablet 3  . Cholecalciferol (VITAMIN D) 2000 UNITS tablet Take 2,000 Units by mouth 2 (two) times daily.    . clopidogrel (PLAVIX) 75 MG tablet Take 1 tablet (75 mg total) by mouth daily. 90 tablet 3  . DENTA 5000 PLUS 1.1 % CREA dental cream Place 1 application onto teeth See admin instructions.     Marland Kitchen dronabinol (MARINOL) 5 MG capsule Take 1 capsule (5 mg total) by mouth 2 (two) times daily  before lunch and supper. (Patient taking differently: Take 5 mg by mouth every evening. ) 60 capsule 0  . DULoxetine (CYMBALTA) 30 MG capsule TAKE 1 CAPSULE BY  MOUTH EVERY DAY 90 capsule 2  . fentaNYL (DURAGESIC) 50 MCG/HR Place 1 patch onto the skin every 3 (three) days. 10 patch 0  . Hypromellose (ARTIFICIAL TEARS OP) Place 1 drop into both eyes at bedtime.     . lidocaine-prilocaine (EMLA) cream Apply 1 application topically as needed. (Patient taking differently: Apply 1 application topically as needed (access port). ) 30 g 1  . LORazepam (ATIVAN) 0.5 MG tablet Take 1 tablet (0.5 mg total) by mouth every 6 (six) hours as needed (Nausea or vomiting). 30 tablet 0  . magnesium oxide (MAG-OX) 400 MG tablet Take 1 tablet (400 mg total) by mouth daily. 90 tablet 0  . metoprolol tartrate (LOPRESSOR) 25 MG tablet Take 1 tablet (25 mg total) by mouth 2 (two) times daily. 180 tablet 3  . morphine (MSIR) 15 MG tablet Take 1-2 tablets (15-30 mg total) by mouth every 4 (four) hours as needed for moderate pain or severe pain. 120 tablet 0  . nicotine (NICODERM CQ - DOSED IN MG/24 HR) 7 mg/24hr patch Place 1 patch (7 mg total) onto the skin daily. 28 patch 0  . ondansetron (ZOFRAN) 8 MG tablet Take 1 tablet (8 mg total) by mouth every 8 (eight) hours as needed for nausea or vomiting. 30 tablet 1  . pantoprazole (PROTONIX) 40 MG tablet Take 1 tablet (40 mg total) by mouth daily. 90 tablet 3  . TRELEGY ELLIPTA 100-62.5-25 MCG/INH AEPB TAKE 1 PUFF BY MOUTH EVERY DAY (Patient taking differently: Take 1 puff by mouth daily. ) 60 each 6  . vitamin B-12 (CYANOCOBALAMIN) 100 MCG tablet Take 100 mcg by mouth in the morning and at bedtime.    . fluconazole (DIFLUCAN) 100 MG tablet Take 1 tablet (100 mg total) by mouth daily for 14 days. 14 tablet 0   No current facility-administered medications for this visit.   Facility-Administered Medications Ordered in Other Visits  Medication Dose Route Frequency Provider Last  Rate Last Admin  . denosumab (XGEVA) injection 120 mg  120 mg Subcutaneous Once Brunetta Genera, MD      . heparin lock flush 100 unit/mL  500 Units Intracatheter Once PRN Brunetta Genera, MD      . pembrolizumab [facility]) 200 mg in sodium chloride 0.9 % 50 mL chemo infusion  200 mg Intravenous Once Brunetta Genera, MD      . sodium chloride flush (NS) 0.9 % injection 10 mL  10 mL Intracatheter PRN Brunetta Genera, MD        REVIEW OF SYSTEMS:   A 10+ POINT REVIEW OF SYSTEMS WAS OBTAINED including neurology, dermatology, psychiatry, cardiac, respiratory, lymph, extremities, GI, GU, Musculoskeletal, constitutional, breasts, reproductive, HEENT.  All pertinent positives are noted in the HPI.  All others are negative.   PHYSICAL EXAMINATION: ECOG PERFORMANCE STATUS: 2 - Symptomatic, <50% confined to bed  Vitals:   02/29/20 1015  BP: 95/65  Pulse: 63  Resp: 17  Temp: 97.9 F (36.6 C)  SpO2: 99%   Filed Weights   02/29/20 1015  Weight: 119 lb 14.4 oz (54.4 kg)   .Body mass index is 16.72 kg/m.   GENERAL:alert, in no acute distress and comfortable SKIN: no acute rashes, no significant lesions EYES: conjunctiva are pink and non-injected, sclera anicteric OROPHARYNX: MMM, no exudates, no oropharyngeal erythema or ulceration NECK: supple, no JVD LYMPH:  no palpable lymphadenopathy in the cervical, axillary or inguinal regions LUNGS: clear to auscultation b/l with normal respiratory effort HEART: regular rate &  rhythm ABDOMEN:  normoactive bowel sounds , non tender, not distended. No palpable hepatosplenomegaly.  Extremity: no pedal edema PSYCH: alert & oriented x 3 with fluent speech NEURO: no focal motor/sensory deficits  LABORATORY DATA:  I have reviewed the data as listed  . CBC Latest Ref Rng & Units 02/29/2020 02/21/2020 02/20/2020  WBC 4.0 - 10.5 K/uL 10.5 9.2 10.9(H)  Hemoglobin 13.0 - 17.0 g/dL 10.7(L) 11.5(L) 10.9(L)  Hematocrit 39 - 52 % 32.2(L)  33.8(L) 31.4(L)  Platelets 150 - 400 K/uL 292 124(L) 116(L)    . CMP Latest Ref Rng & Units 02/29/2020 02/23/2020 02/22/2020  Glucose 70 - 99 mg/dL 93 109(H) 150(H)  BUN 8 - 23 mg/dL 23 21 27(H)  Creatinine 0.61 - 1.24 mg/dL 0.85 0.75 0.87  Sodium 135 - 145 mmol/L 137 136 134(L)  Potassium 3.5 - 5.1 mmol/L 4.4 4.0 3.3(L)  Chloride 98 - 111 mmol/L 102 103 101  CO2 22 - 32 mmol/L 29 24 25   Calcium 8.9 - 10.3 mg/dL 8.7(L) 8.1(L) 8.3(L)  Total Protein 6.5 - 8.1 g/dL 6.3(L) - -  Total Bilirubin 0.3 - 1.2 mg/dL 0.4 - -  Alkaline Phos 38 - 126 U/L 63 - -  AST 15 - 41 U/L 23 - -  ALT 0 - 44 U/L 34 - -        RADIOGRAPHIC STUDIES: I have personally reviewed the radiological images as listed and agreed with the findings in the report. DG Chest 2 View  Result Date: 02/19/2020 CLINICAL DATA:  Suspected sepsis. Weakness and fever. Diminished appetite. Radiologic records state non-small cell lung cancer. EXAM: CHEST - 2 VIEW COMPARISON:  Chest CT last month 02/02/2020. FINDINGS: Right chest port with tip in the SVC. Stable scarring in the suprahilar left lung, corresponding to treated malignancy on recent CT. Mild chronic left lung volume loss. Underlying emphysema with hyperinflation. No acute or focal airspace disease. No significant pleural effusion. No pneumothorax. Bony destruction involving posterior left fifth and sixth ribs involving the adjacent vertebral body, not well demonstrated by radiograph. IMPRESSION: 1. No acute chest findings. 2. Stable scarring in the suprahilar left lung, corresponding to treated malignancy on recent CT. 3. Hyperinflation and bronchial thickening consistent with COPD. Electronically Signed   By: Keith Rake M.D.   On: 02/19/2020 16:11   CT Chest W Contrast  Result Date: 02/02/2020 CLINICAL DATA:  74 year old male with history of non-small cell lung cancer. Evaluate for metastatic disease. EXAM: CT CHEST, ABDOMEN, AND PELVIS WITH CONTRAST TECHNIQUE:  Multidetector CT imaging of the chest, abdomen and pelvis was performed following the standard protocol during bolus administration of intravenous contrast. CONTRAST:  11m OMNIPAQUE IOHEXOL 300 MG/ML  SOLN COMPARISON:  Chest CT 09/28/2019 and multiple older prior examinations. CT the abdomen and pelvis 02/09/2019 and multiple older prior examinations. FINDINGS: CT CHEST FINDINGS Cardiovascular: Heart size is normal. There is no significant pericardial fluid, thickening or pericardial calcification. There is aortic atherosclerosis, as well as atherosclerosis of the great vessels of the mediastinum and the coronary arteries, including calcified atherosclerotic plaque in the left anterior descending, left circumflex and right coronary arteries. Severe thickening calcification of the aortic valve. Right internal jugular single-lumen porta cath with tip terminating in the right atrium. Mediastinum/Nodes: No pathologically enlarged mediastinal or hilar lymph nodes. Esophagus is unremarkable in appearance. No axillary lymphadenopathy. Lungs/Pleura: Area of architectural distortion and cavitation medial aspect of the left lung centered in the superior segment of the left lower lobe, similar to the prior study,  with associated lytic lesion involving the left side of T5 and T6 vertebra, and the posterior aspects of the left fifth and sixth ribs (discussed below), similar to prior studies. No convincing area of nodularity within this region to clearly indicate recurrent/progressive disease. No other definite suspicious appearing pulmonary nodules or masses are noted. No acute consolidative airspace disease. No pleural effusions. Diffuse bronchial wall thickening with moderate centrilobular and paraseptal emphysema. Musculoskeletal: Large invasive mass in the posteromedial aspect of the left hemithorax which again directly invades the left side of the T5 and T6 vertebra as well as the posterior left fifth and sixth ribs,  similar to the prior study, measuring approximately 6.6 x 4.4 cm (axial image 40 of series 4). No other new aggressive appearing lytic or blastic lesions are noted elsewhere in the visualized portions of the skeleton. CT ABDOMEN PELVIS FINDINGS Hepatobiliary: No suspicious cystic or solid hepatic lesions. No intra or extrahepatic biliary ductal dilatation. Gallbladder is normal in appearance. Pancreas: No pancreatic mass. No pancreatic ductal dilatation. No pancreatic or peripancreatic fluid collections or inflammatory changes. Spleen: Unremarkable. Adrenals/Urinary Tract: 9.2 x 7.8 cm low-attenuation nonenhancing lesion in the upper pole the left kidney, similar to prior studies, compatible with a large simple cyst. Other smaller subcentimeter low-attenuation lesions in both kidneys, too small to definitively characterize, but statistically likely to represent cysts. 5 mm nonobstructive calculus in the upper pole collecting system of the right kidney. Bilateral adrenal glands are normal in appearance. No hydroureteronephrosis. Urinary bladder is normal in appearance. Stomach/Bowel: Normal appearance of the stomach. No pathologic dilatation of small bowel or colon. Normal appendix. Vascular/Lymphatic: Aortic atherosclerosis, without evidence of aneurysm or dissection in the abdominal or pelvic vasculature. No lymphadenopathy noted in the abdomen or pelvis. Reproductive: Prostate gland and seminal vesicles are unremarkable in appearance. Other: No significant volume of ascites.  No pneumoperitoneum. Musculoskeletal: There are no aggressive appearing lytic or blastic lesions noted in the visualized portions of the skeleton. IMPRESSION: 1. Stable appearance of treated lesions centered in the superior segment of the left lower lobe with persistent direct invasion of the adjacent posteromedial left chest wall and vertebral column, as detailed above. No definitive findings to suggest local recurrence of disease or new  metastatic disease noted on today's examination. 2. Previously identified hypovascular lesion in the superior aspect of segment 7 of the liver is not confidently identified on today's examination. 3. 5 mm nonobstructive calculus again noted in the upper pole collecting system of the right kidney. 4. Aortic atherosclerosis, in addition to 3 vessel coronary artery disease. Assessment for potential risk factor modification, dietary therapy or pharmacologic therapy may be warranted, if clinically indicated. 5. Diffuse bronchial wall thickening with moderate centrilobular and paraseptal emphysema; imaging findings suggestive of underlying COPD. 6. There are calcifications of the aortic valve. Echocardiographic correlation for evaluation of potential valvular dysfunction may be warranted if clinically indicated. 7. Additional incidental findings, as above. Electronically Signed   By: Vinnie Langton M.D.   On: 02/02/2020 15:12   CT ANGIO CHEST PE W OR WO CONTRAST  Result Date: 02/20/2020 CLINICAL DATA:  Pneumonia.  Evaluate for pulmonary embolus. EXAM: CT ANGIOGRAPHY CHEST WITH CONTRAST TECHNIQUE: Multidetector CT imaging of the chest was performed using the standard protocol during bolus administration of intravenous contrast. Multiplanar CT image reconstructions and MIPs were obtained to evaluate the vascular anatomy. CONTRAST:  112m OMNIPAQUE IOHEXOL 350 MG/ML SOLN COMPARISON:  02/02/2020 FINDINGS: Cardiovascular: No filling defects in the pulmonary arteries to suggest acute pulmonary  embolus. 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 Heart is normal size. Diffuse coronary artery calcifications and aortic atherosclerosis. No aneurysm. Mediastinum/Nodes: No mediastinal, hilar, or axillary adenopathy. Lungs/Pleura: Area of architectural distortion and cavitation again seen in the superior segment of the left lower lobe, stable. Abnormal bronchial wall  thickening in the left mainstem bronchus and left lower lobe. Airspace disease in the left lower lobe compatible with pneumonia. Small left effusion. Biapical scarring. Moderate centrilobular and paraseptal emphysema. Upper Abdomen: Large cyst in the upper pole of the left kidney, unchanged. No acute findings. Musculoskeletal: Large lytic destructive process seen within the T5 and T6 vertebral bodies as well as the adjacent medial left 5th and 6th ribs, unchanged since prior studies. Chest wall soft tissues unremarkable. Review of the MIP images confirms the above findings. IMPRESSION: No evidence of acute pulmonary embolus. 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 This may be related to tumor occlusion or post treatment. Continued architectural distortion and cavitary lesion in the superior segment of the left lower lobe, unchanged. Continued peribronchial thickening. New airspace disease in the left lower lobe with small left effusion, most compatible with pneumonia. Stable destructive lytic lesion within the T5 and T6 vertebral body and adjacent posterior left ribs. Aortic Atherosclerosis (ICD10-I70.0) and Emphysema (ICD10-J43.9). Electronically Signed   By: Rolm Baptise M.D.   On: 02/20/2020 02:10   CT Abdomen Pelvis W Contrast  Result Date: 02/02/2020 CLINICAL DATA:  74 year old male with history of non-small cell lung cancer. Evaluate for metastatic disease. EXAM: CT CHEST, ABDOMEN, AND PELVIS WITH CONTRAST TECHNIQUE: Multidetector CT imaging of the chest, abdomen and pelvis was performed following the standard protocol during bolus administration of intravenous contrast. CONTRAST:  135m OMNIPAQUE IOHEXOL 300 MG/ML  SOLN COMPARISON:  Chest CT 09/28/2019 and multiple older prior examinations. CT the abdomen and pelvis 02/09/2019 and multiple older prior examinations. FINDINGS: CT CHEST FINDINGS Cardiovascular: Heart size is normal. There is no significant  pericardial fluid, thickening or pericardial calcification. There is aortic atherosclerosis, as well as atherosclerosis of the great vessels of the mediastinum and the coronary arteries, including calcified atherosclerotic plaque in the left anterior descending, left circumflex and right coronary arteries. Severe thickening calcification of the aortic valve. Right internal jugular single-lumen porta cath with tip terminating in the right atrium. Mediastinum/Nodes: No pathologically enlarged mediastinal or hilar lymph nodes. Esophagus is unremarkable in appearance. No axillary lymphadenopathy. Lungs/Pleura: Area of architectural distortion and cavitation medial aspect of the left lung centered in the superior segment of the left lower lobe, similar to the prior study, with associated lytic lesion involving the left side of T5 and T6 vertebra, and the posterior aspects of the left fifth and sixth ribs (discussed below), similar to prior studies. No convincing area of nodularity within this region to clearly indicate recurrent/progressive disease. No other definite suspicious appearing pulmonary nodules or masses are noted. No acute consolidative airspace disease. No pleural effusions. Diffuse bronchial wall thickening with moderate centrilobular and paraseptal emphysema. Musculoskeletal: Large invasive mass in the posteromedial aspect of the left hemithorax which again directly invades the left side of the T5 and T6 vertebra as well as the posterior left fifth and sixth ribs, similar to the prior study, measuring approximately 6.6 x 4.4 cm (axial image 40 of series 4). No other new aggressive appearing lytic or blastic lesions are noted elsewhere in the visualized portions of the skeleton. CT  ABDOMEN PELVIS FINDINGS Hepatobiliary: No suspicious cystic or solid hepatic lesions. No intra or extrahepatic biliary ductal dilatation. Gallbladder is normal in appearance. Pancreas: No pancreatic mass. No pancreatic ductal  dilatation. No pancreatic or peripancreatic fluid collections or inflammatory changes. Spleen: Unremarkable. Adrenals/Urinary Tract: 9.2 x 7.8 cm low-attenuation nonenhancing lesion in the upper pole the left kidney, similar to prior studies, compatible with a large simple cyst. Other smaller subcentimeter low-attenuation lesions in both kidneys, too small to definitively characterize, but statistically likely to represent cysts. 5 mm nonobstructive calculus in the upper pole collecting system of the right kidney. Bilateral adrenal glands are normal in appearance. No hydroureteronephrosis. Urinary bladder is normal in appearance. Stomach/Bowel: Normal appearance of the stomach. No pathologic dilatation of small bowel or colon. Normal appendix. Vascular/Lymphatic: Aortic atherosclerosis, without evidence of aneurysm or dissection in the abdominal or pelvic vasculature. No lymphadenopathy noted in the abdomen or pelvis. Reproductive: Prostate gland and seminal vesicles are unremarkable in appearance. Other: No significant volume of ascites.  No pneumoperitoneum. Musculoskeletal: There are no aggressive appearing lytic or blastic lesions noted in the visualized portions of the skeleton. IMPRESSION: 1. Stable appearance of treated lesions centered in the superior segment of the left lower lobe with persistent direct invasion of the adjacent posteromedial left chest wall and vertebral column, as detailed above. No definitive findings to suggest local recurrence of disease or new metastatic disease noted on today's examination. 2. Previously identified hypovascular lesion in the superior aspect of segment 7 of the liver is not confidently identified on today's examination. 3. 5 mm nonobstructive calculus again noted in the upper pole collecting system of the right kidney. 4. Aortic atherosclerosis, in addition to 3 vessel coronary artery disease. Assessment for potential risk factor modification, dietary therapy or  pharmacologic therapy may be warranted, if clinically indicated. 5. Diffuse bronchial wall thickening with moderate centrilobular and paraseptal emphysema; imaging findings suggestive of underlying COPD. 6. There are calcifications of the aortic valve. Echocardiographic correlation for evaluation of potential valvular dysfunction may be warranted if clinically indicated. 7. Additional incidental findings, as above. Electronically Signed   By: Vinnie Langton M.D.   On: 02/02/2020 15:12   CT Renal Stone Study  Result Date: 02/19/2020 CLINICAL DATA:  Hematuria of unknown cause, history of fever and hematuria EXAM: CT ABDOMEN AND PELVIS WITHOUT CONTRAST TECHNIQUE: Multidetector CT imaging of the abdomen and pelvis was performed following the standard protocol without IV contrast. COMPARISON:  02/02/2020 FINDINGS: Lower chest: Signs of interstitial and airspace disease in the dependent LEFT chest mild elevation of the LEFT hemidiaphragm. Elevation of the LEFT hemidiaphragm is new compared to the prior study. Small LEFT pleural effusion associated with process at LEFT lung base. Signs of pulmonary emphysema hemidiaphragm Hepatobiliary: Liver normal size and contour. No pericholecystic stranding. Cholelithiasis. Pancreas: Pancreas not clearly visualized. No signs of peripancreatic inflammation to the extent evaluated. Spleen: Spleen normal size and contour. Adrenals/Urinary Tract: Stable appearance of large LEFT renal cyst extending medially from the LEFT kidney measuring approximately 8.5 x 6.8 cm. Nephrolithiasis upper pole of the RIGHT kidney is similar to the prior study. This measures approximately 6 mm. Mild perinephric stranding on the LEFT. Stomach/Bowel: Limited assessment of gastrointestinal tract due to very limited intra-abdominal and mesenteric fat. Formed stool in the descending colon. No signs of free air. Scattered gas-filled loops of bowel throughout the abdomen. Vascular/Lymphatic: Calcific  atheromatous plaque throughout the abdominal aorta. No gross adenopathy. No aneurysmal dilation. No pelvic adenopathy. Reproductive: Prostate grossly normal. Other:  No free air.  No ascites. Musculoskeletal: Spinal degenerative changes. No acute or destructive bone process. IMPRESSION: 1. Signs of interstitial and airspace disease in the dependent LEFT chest with mild elevation of the LEFT hemidiaphragm. Small LEFT pleural effusion associated with process at the LEFT lung base. Pneumonia perhaps due to aspiration. Given mixed density the possibility of pulmonary infarct is also considered. 2. Stable appearance of large LEFT renal cyst. Mild bilateral perinephric stranding, correlate with urine studies. 3. Cholelithiasis without evidence of acute cholecystitis. 4. Nephrolithiasis. 5. Limited assessment of gastrointestinal tract due to very limited intra-abdominal and mesenteric fat. 6. Emphysema and aortic atherosclerosis. These results were called by telephone at the time of interpretation on 02/19/2020 at 6:48 pm to provider JOSHUA LONG , who verbally acknowledged these results. Aortic Atherosclerosis (ICD10-I70.0) and Emphysema (ICD10-J43.9). Electronically Signed   By: Zetta Bills M.D.   On: 02/19/2020 18:48     ASSESSMENT & PLAN:   74 y.o. male with  1. 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 10/19/18 Lung biopsy revealed Squamous Cell carcinoma S/p Palliative RT to LUL lung mass with 30Gy in 10 fractions between 10/22/18 and 11/03/18, due to impending cord compression  10/19/18 PDL-1 status report found 30% PDL-1 tumor proportion  11/06/18 MRI Brain revealed No intracranial metastatic disease identified. 2. Mild chronic microvascular ischemic changes and volume loss of the brain.  11/10/18 PET/CT revealed Locally advanced left lung mass, as detailed previously.  2. Left perihilar direct extension versus nodal metastasis. 3. No findings of hypermetabolic distant metastasis. 4. No findings of extrathoracic hypermetabolic metastasis.  02/09/19 CT Chest and Abdomen revealed "Today's study demonstrates a positive response to therapy with slight regression of the primary lesion in the superior segment of the left lower lobe which again demonstrates direct invasion of the adjacent posteromedial left chest wall and vertebral column, as detailed above. 2. However, there is a new hypovascular lesion in segment 7 of the liver. This is nonspecific and too small to characterize, but given the development compared to prior studies, this is concerning for potential metastasis. This could be definitively characterized with MRI of the abdomen with and without IV gadolinium if clinically appropriate. 3. 5 mm nonobstructive calculus in the right renal collecting system. 4. Aortic atherosclerosis, in addition to 3 vessel coronary artery disease. Assessment for potential risk factor modification, dietary therapy or pharmacologic therapy may be warranted, if clinically Indicated. 5. There are calcifications of the aortic valve. Echocardiographic correlation for evaluation of potential valvular dysfunction may be warranted if clinically indicated. 6. Additional incidental findings, as above." Discussed that I suspect that the liver finding is likely a cyst, as progression through treatment would be unexpected. Nevertheless, will monitor this finding over time with future scans.  S/p 5 cycles of Carboplatin AUC of 5, 162m/m2 Taxol, and Pembrolizumab with G-CSF support completed on 02/16/19.  03/08/19 ECHO which revealed LV EF of 55-60%  05/30/2019 CT Chest w/o contrast ([id number] which revealed "1. Stable post treatment related changes of prior radiation therapy in the left lung. Chronic destructive changes in T5 and T6 vertebrae as well as the posterior aspects of the left fifth and sixth  ribs, similar to the prior study. 2. Diffuse bronchial wall thickening with mild to moderate centrilobular and paraseptal emphysema. 3. Aortic atherosclerosis, in addition to three-vessel coronary artery disease. Please note that although the presence of  coronary artery calcium documents the presence of coronary artery disease, the severity of this disease and any potential stenosis cannot be assessed on this non-gated CT examination. Assessment for potential risk factor modification, dietary therapy or pharmacologic therapy may be warranted, if clinically indicated. 4. There are calcifications of the aortic valve and mitral valve/annulus. Echocardiographic correlation for evaluation of potential valvular dysfunction may be warranted if clinically indicated. 5. 6 mm nonobstructive calculus in the upper pole collecting system of the right kidney. 6. Additional incidental findings, as above."  09/28/2019 CT Chest ([id number]) revealed stable appearance of main mass, some radiation changes in the lung, no new lung nodules, no new bone changes  2. 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 Smoker 4. 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  Has regularly followed up with ENT Dr. Fenton Malling 6. Cancer related pain 7. Recent Community acquired pneumonia  PLAN: -Discussed pt labwork today, 02/29/20; blood counts are stable, blood chemistries are improving, TSH is WNL -No lab or clinical evidence of Squamous Cell Carcinoma Lung Cancer progression at this time.  -The pt has no prohibitive toxicities from continuing maintenance Pembrolizumab at this time. Pt functioning well enough to continue treatment.  -Recommend pt receive Pneumonia vaccines - pt will f/u with PCP -Continue Xgeva every 6 weeks with every other  treatment  -Will see back in with next cycle of Pembrolizumab   FOLLOW UP: Please schedule next 2 cycles of Pembrolizumab with portflush, labs and MD visits   The total time spent in the appt was 30 minutes and more than 50% was on counseling and direct patient cares and reviewing hospital course and considerations.  All of the patient's questions were answered with apparent satisfaction. The patient knows to call the clinic with any problems, questions or concerns.   Sullivan Lone MD [place] AAHIVMS [facility] Hematology/Oncology Physician [facility]  (Office):       [phone] (Work cell):  [phone] (Fax):           [phone]  02/29/2020 12:13 PM  I, Yevette Edwards, am acting as a scribe for Dr. Sullivan Lone.   .I have reviewed the above documentation for accuracy and completeness, and I agree with the above. Brunetta Genera MD

## 2020-02-29 ENCOUNTER — Inpatient Hospital Stay: Payer: No Typology Code available for payment source

## 2020-02-29 ENCOUNTER — Inpatient Hospital Stay (HOSPITAL_BASED_OUTPATIENT_CLINIC_OR_DEPARTMENT_OTHER): Payer: No Typology Code available for payment source | Admitting: Hematology

## 2020-02-29 ENCOUNTER — Other Ambulatory Visit: Payer: Self-pay

## 2020-02-29 VITALS — BP 95/65 | HR 63 | Temp 97.9°F | Resp 17 | Ht 71.0 in | Wt 119.9 lb

## 2020-02-29 DIAGNOSIS — C3412 Malignant neoplasm of upper lobe, left bronchus or lung: Secondary | ICD-10-CM

## 2020-02-29 DIAGNOSIS — C7951 Secondary malignant neoplasm of bone: Secondary | ICD-10-CM

## 2020-02-29 DIAGNOSIS — Z5112 Encounter for antineoplastic immunotherapy: Secondary | ICD-10-CM | POA: Diagnosis not present

## 2020-02-29 DIAGNOSIS — Z7189 Other specified counseling: Secondary | ICD-10-CM

## 2020-02-29 DIAGNOSIS — C3492 Malignant neoplasm of unspecified part of left bronchus or lung: Secondary | ICD-10-CM

## 2020-02-29 LAB — CBC WITH DIFFERENTIAL/PLATELET
Abs Immature Granulocytes: 0.05 10*3/uL (ref 0.00–0.07)
Basophils Absolute: 0 10*3/uL (ref 0.0–0.1)
Basophils Relative: 0 %
Eosinophils Absolute: 0.1 10*3/uL (ref 0.0–0.5)
Eosinophils Relative: 1 %
HCT: 32.2 % — ABNORMAL LOW (ref 39.0–52.0)
Hemoglobin: 10.7 g/dL — ABNORMAL LOW (ref 13.0–17.0)
Immature Granulocytes: 1 %
Lymphocytes Relative: 5 %
Lymphs Abs: 0.5 10*3/uL — ABNORMAL LOW (ref 0.7–4.0)
MCH: 33 pg (ref 26.0–34.0)
MCHC: 33.2 g/dL (ref 30.0–36.0)
MCV: 99.4 fL (ref 80.0–100.0)
Monocytes Absolute: 0.8 10*3/uL (ref 0.1–1.0)
Monocytes Relative: 8 %
Neutro Abs: 8.9 10*3/uL — ABNORMAL HIGH (ref 1.7–7.7)
Neutrophils Relative %: 85 %
Platelets: 292 10*3/uL (ref 150–400)
RBC: 3.24 MIL/uL — ABNORMAL LOW (ref 4.22–5.81)
RDW: 13.2 % (ref 11.5–15.5)
WBC: 10.5 10*3/uL (ref 4.0–10.5)
nRBC: 0 % (ref 0.0–0.2)

## 2020-02-29 LAB — CMP (CANCER CENTER ONLY)
ALT: 34 U/L (ref 0–44)
AST: 23 U/L (ref 15–41)
Albumin: 2.8 g/dL — ABNORMAL LOW (ref 3.5–5.0)
Alkaline Phosphatase: 63 U/L (ref 38–126)
Anion gap: 6 (ref 5–15)
BUN: 23 mg/dL (ref 8–23)
CO2: 29 mmol/L (ref 22–32)
Calcium: 8.7 mg/dL — ABNORMAL LOW (ref 8.9–10.3)
Chloride: 102 mmol/L (ref 98–111)
Creatinine: 0.85 mg/dL (ref 0.61–1.24)
GFR, Est AFR Am: >60 mL/min (ref >60)
GFR, Estimated: >60 mL/min (ref >60)
Glucose, Bld: 93 mg/dL (ref 70–99)
Potassium: 4.4 mmol/L (ref 3.5–5.1)
Sodium: 137 mmol/L (ref 135–145)
Total Bilirubin: 0.4 mg/dL (ref 0.3–1.2)
Total Protein: 6.3 g/dL — ABNORMAL LOW (ref 6.5–8.1)

## 2020-02-29 LAB — TSH: TSH: 2.343 u[IU]/mL (ref 0.320–4.118)

## 2020-02-29 MED ORDER — DIPHENHYDRAMINE HCL 50 MG/ML IJ SOLN
25.0000 mg | Freq: Once | INTRAMUSCULAR | Status: AC
  Administered 2020-02-29: 25 mg via INTRAVENOUS

## 2020-02-29 MED ORDER — DENOSUMAB 120 MG/1.7ML ~~LOC~~ SOLN
120.0000 mg | Freq: Once | SUBCUTANEOUS | Status: AC
  Administered 2020-02-29: 120 mg via SUBCUTANEOUS

## 2020-02-29 MED ORDER — HEPARIN SOD (PORK) LOCK FLUSH 100 UNIT/ML IV SOLN
500.0000 [IU] | Freq: Once | INTRAVENOUS | Status: AC | PRN
  Administered 2020-02-29: 500 [IU]
  Filled 2020-02-29: qty 5

## 2020-02-29 MED ORDER — ALTEPLASE 2 MG IJ SOLR
2.0000 mg | Freq: Once | INTRAMUSCULAR | Status: DC | PRN
  Filled 2020-02-29: qty 2

## 2020-02-29 MED ORDER — FAMOTIDINE IN NACL 20-0.9 MG/50ML-% IV SOLN
INTRAVENOUS | Status: AC
  Filled 2020-02-29: qty 50

## 2020-02-29 MED ORDER — SODIUM CHLORIDE 0.9% FLUSH
10.0000 mL | INTRAVENOUS | Status: DC | PRN
  Administered 2020-02-29: 10 mL
  Filled 2020-02-29: qty 10

## 2020-02-29 MED ORDER — HEPARIN SOD (PORK) LOCK FLUSH 100 UNIT/ML IV SOLN
500.0000 [IU] | Freq: Once | INTRAVENOUS | Status: DC | PRN
  Filled 2020-02-29: qty 5

## 2020-02-29 MED ORDER — FAMOTIDINE IN NACL 20-0.9 MG/50ML-% IV SOLN
20.0000 mg | Freq: Once | INTRAVENOUS | Status: AC
  Administered 2020-02-29: 20 mg via INTRAVENOUS

## 2020-02-29 MED ORDER — SODIUM CHLORIDE 0.9 % IV SOLN
200.0000 mg | Freq: Once | INTRAVENOUS | Status: AC
  Administered 2020-02-29: 200 mg via INTRAVENOUS
  Filled 2020-02-29: qty 8

## 2020-02-29 MED ORDER — DENOSUMAB 120 MG/1.7ML ~~LOC~~ SOLN
SUBCUTANEOUS | Status: AC
  Filled 2020-02-29: qty 1.7

## 2020-02-29 MED ORDER — DIPHENHYDRAMINE HCL 50 MG/ML IJ SOLN
INTRAMUSCULAR | Status: AC
  Filled 2020-02-29: qty 1

## 2020-02-29 MED ORDER — SODIUM CHLORIDE 0.9 % IV SOLN
Freq: Once | INTRAVENOUS | Status: AC
  Filled 2020-02-29: qty 250

## 2020-02-29 NOTE — Progress Notes (Signed)
Pharmacist Chemotherapy Monitoring - Follow Up Assessment    I verify that I have reviewed each item in the below checklist:  . Regimen for the patient is scheduled for the appropriate day and plan matches scheduled date. Marland Kitchen Appropriate non-routine labs are ordered dependent on drug ordered. . If applicable, additional medications reviewed and ordered per protocol based on lifetime cumulative doses and/or treatment regimen.   Plan for follow-up and/or issues identified: No . I-vent associated with next due treatment: No . MD and/or nursing notified: No  Kyle Foster 02/29/2020 12:47 PM

## 2020-02-29 NOTE — Patient Instructions (Signed)
[facility] Discharge Instructions for Patients Receiving Chemotherapy  Today you received the following chemotherapy agents: pembrolizumab.  To help prevent nausea and vomiting after your treatment, we encourage you to take your nausea medication as directed.   If you develop nausea and vomiting that is not controlled by your nausea medication, call the clinic.   BELOW ARE SYMPTOMS THAT SHOULD BE REPORTED IMMEDIATELY:  *FEVER GREATER THAN 100.5 F  *CHILLS WITH OR WITHOUT FEVER  NAUSEA AND VOMITING THAT IS NOT CONTROLLED WITH YOUR NAUSEA MEDICATION  *UNUSUAL SHORTNESS OF BREATH  *UNUSUAL BRUISING OR BLEEDING  TENDERNESS IN MOUTH AND THROAT WITH OR WITHOUT PRESENCE OF ULCERS  *URINARY PROBLEMS  *BOWEL PROBLEMS  UNUSUAL RASH Items with * indicate a potential emergency and should be followed up as soon as possible.  Feel free to call the clinic should you have any questions or concerns. The clinic phone number is [phone].  Please show the CHEMO ALERT CARD at check-in to the Emergency Department and triage nurse.   

## 2020-03-01 ENCOUNTER — Telehealth: Payer: Self-pay

## 2020-03-01 ENCOUNTER — Ambulatory Visit (INDEPENDENT_AMBULATORY_CARE_PROVIDER_SITE_OTHER): Payer: Medicare Other | Admitting: Physician Assistant

## 2020-03-01 ENCOUNTER — Encounter: Payer: Self-pay | Admitting: Physician Assistant

## 2020-03-01 VITALS — BP 124/74 | HR 68 | Temp 98.0°F | Resp 18 | Ht 71.0 in | Wt 119.6 lb

## 2020-03-01 DIAGNOSIS — Z23 Encounter for immunization: Secondary | ICD-10-CM

## 2020-03-01 DIAGNOSIS — J189 Pneumonia, unspecified organism: Secondary | ICD-10-CM

## 2020-03-01 NOTE — Patient Instructions (Signed)
I am glad you are doing so well! Exam today is great. We have updated your pneumonia vaccine.   I believe energy will come back once you get active with PT. If not, let me know and we will look into this further.  Continue use of your incentive spirometer. Follow-up with your specialists as scheduled.  Hang in there!

## 2020-03-01 NOTE — Telephone Encounter (Signed)
Received call from [place] pt's. spouse regarding 03/06/20 appt. and if this was necessary since pt. already got his treatment on 02/29/20. Informed her this appt. has been canceled and pt. is due back on 03/21/20 for Lab and Infusion. She verbalized understanding.

## 2020-03-01 NOTE — Progress Notes (Signed)
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 >60 mL/min   Anion gap 10 5 - 15    Comment: Performed at [facility], [place] [address] [place], [place] [address]  Expectorated sputum assessment w rflx to resp cult     Status: None   Collection Time: 02/20/20 11:50 AM   Specimen: Expectorated Sputum  Result Value Ref Range   Specimen Description EXPECTORATED SPUTUM    Special Requests NONE    Sputum evaluation      THIS SPECIMEN IS ACCEPTABLE FOR  SPUTUM CULTURE Performed at [facility], [place] [address], [place], [place] [address]    Report Status 02/22/2020 FINAL   Culture, respiratory     Status: None   Collection Time: 02/20/20 11:50 AM  Result Value Ref Range   Specimen Description      EXPECTORATED SPUTUM Performed at [facility], [place] [address] [place], [place] [address]    Special Requests      NONE Reflexed from [id number] Performed at [facility], [place] [address], [place], [place] [address]    Gram Stain      FEW WBC PRESENT, PREDOMINANTLY MONONUCLEAR FEW YEAST RARE SQUAMOUS EPITHELIAL CELLS PRESENT Performed at [facility], [place] [address] [place], [place] [address]    Culture FEW CANDIDA ALBICANS    Report Status 02/24/2020 FINAL   CBC with Differential/Platelet     Status: Abnormal   Collection Time: 02/21/20  5:18 AM  Result Value Ref Range   WBC 9.2 4.0 - 10.5 K/uL   RBC 3.50 (L) 4.22 - 5.81 MIL/uL   Hemoglobin 11.5 (L) 13.0 - 17.0 g/dL   HCT 33.8 (L) 39 - 52 %   MCV 96.6 80.0 - 100.0 fL   MCH 32.9 26.0 - 34.0 pg   MCHC 34.0 30.0 - 36.0 g/dL   RDW 13.4 11.5 - 15.5 %   Platelets 124 (L) 150 - 400 K/uL   nRBC 0.0 0.0 - 0.2 %   Neutrophils Relative % 89 %   Neutro Abs 8.3 (H) 1.7 - 7.7 K/uL   Lymphocytes Relative 3 %   Lymphs Abs 0.2 (L) 0.7 - 4.0 K/uL   Monocytes Relative 7 %   Monocytes Absolute 0.7 0 - 1 K/uL   Eosinophils Relative 0 %   Eosinophils Absolute 0.0 0 - 0 K/uL   Basophils Relative 0 %   Basophils Absolute 0.0 0 - 0 K/uL   Immature Granulocytes 1 %   Abs Immature Granulocytes 0.05 0.00 - 0.07 K/uL      Comment: Performed at [facility], [place] [address] [place], [place] [address]  Comprehensive metabolic panel     Status: Abnormal   Collection Time: 02/21/20  5:18 AM  Result Value Ref Range   Sodium 133 (L) 135 - 145 mmol/L   Potassium 3.3 (L) 3.5 - 5.1 mmol/L   Chloride 101 98 - 111 mmol/L   CO2 23 22 - 32 mmol/L   Glucose, Bld 107 (H) 70 - 99 mg/dL    Comment: Glucose reference range applies only to samples taken after fasting for at least 8 hours.   BUN 23 8 - 23 mg/dL   Creatinine, Ser 0.90 0.61 - 1.24 mg/dL   Calcium 7.7 (L) 8.9 - 10.3 mg/dL   Total Protein 5.7 (L) 6.5 - 8.1 g/dL   Albumin 2.7 (L) 3.5 - 5.0 g/dL   AST 29 15 - 41 U/L   ALT 22 0 - 44 U/L   Alkaline Phosphatase 37 (L) 38 - 126 U/L   Total Bilirubin 0.7 0.3 - 1.2 mg/dL   GFR calc non Af Amer >60 >60 mL/min   GFR calc Af Amer >60 >60 mL/min   Anion gap 9 5 - 15    Comment: Performed at [facility], [place] [address] [place], [place] [address]  Magnesium     Status: None   Collection Time: 02/21/20  5:18 AM  Result Value Ref Range   Magnesium 1.9  1.7 - 2.4 mg/dL    Comment: Performed at [facility], [place] [address] [place], [place] [address]  Basic metabolic panel     Status: Abnormal   Collection Time: 02/22/20  9:31 AM  Result Value Ref Range   Sodium 134 (L) 135 - 145 mmol/L   Potassium 3.3 (L) 3.5 - 5.1 mmol/L   Chloride 101 98 - 111 mmol/L   CO2 25 22 - 32 mmol/L   Glucose, Bld 150 (H) 70 - 99 mg/dL    Comment: Glucose reference range applies only to samples taken after fasting for at least 8 hours.   BUN 27 (H) 8 - 23 mg/dL   Creatinine, Ser 0.87 0.61 - 1.24 mg/dL   Calcium 8.3 (L) 8.9 - 10.3 mg/dL   GFR calc non Af Amer >60 >60 mL/min   GFR calc Af Amer >60 >60 mL/min   Anion gap 8 5 - 15    Comment: Performed at [facility], [place] [address] [place], [place] [address]  Basic metabolic panel     Status: Abnormal    Collection Time: 02/23/20  8:12 AM  Result Value Ref Range   Sodium 136 135 - 145 mmol/L   Potassium 4.0 3.5 - 5.1 mmol/L    Comment: DELTA CHECK NOTED NO VISIBLE HEMOLYSIS    Chloride 103 98 - 111 mmol/L   CO2 24 22 - 32 mmol/L   Glucose, Bld 109 (H) 70 - 99 mg/dL    Comment: Glucose reference range applies only to samples taken after fasting for at least 8 hours.   BUN 21 8 - 23 mg/dL   Creatinine, Ser 0.75 0.61 - 1.24 mg/dL   Calcium 8.1 (L) 8.9 - 10.3 mg/dL   GFR calc non Af Amer >60 >60 mL/min   GFR calc Af Amer >60 >60 mL/min   Anion gap 9 5 - 15    Comment: Performed at [facility], [place] [address] [place], [place] [address]  CBC with Differential/Platelet     Status: Abnormal   Collection Time: 02/29/20 10:09 AM  Result Value Ref Range   WBC 10.5 4.0 - 10.5 K/uL   RBC 3.24 (L) 4.22 - 5.81 MIL/uL   Hemoglobin 10.7 (L) 13.0 - 17.0 g/dL   HCT 32.2 (L) 39 - 52 %   MCV 99.4 80.0 - 100.0 fL   MCH 33.0 26.0 - 34.0 pg   MCHC 33.2 30.0 - 36.0 g/dL   RDW 13.2 11.5 - 15.5 %   Platelets 292 150 - 400 K/uL   nRBC 0.0 0.0 - 0.2 %   Neutrophils Relative % 85 %   Neutro Abs 8.9 (H) 1.7 - 7.7 K/uL   Lymphocytes Relative 5 %   Lymphs Abs 0.5 (L) 0.7 - 4.0 K/uL   Monocytes Relative 8 %   Monocytes Absolute 0.8 0 - 1 K/uL   Eosinophils Relative 1 %   Eosinophils Absolute 0.1 0 - 0 K/uL   Basophils Relative 0 %   Basophils Absolute 0.0 0 - 0 K/uL   Immature Granulocytes 1 %   Abs Immature Granulocytes 0.05 0.00 - 0.07 K/uL    Comment: Performed at [facility], [place] [address], [place], [place] [address]  CMP ([place] only)     Status: Abnormal   Collection Time: 02/29/20 10:09 AM  Result Value Ref Range   Sodium 137 135 - 145 mmol/L   Potassium 4.4 3.5 - 5.1 mmol/L  Chloride 102 98 - 111 mmol/L   CO2 29 22 - 32 mmol/L   Glucose, Bld 93 70 - 99 mg/dL    Comment: Glucose reference range applies only to samples taken after  fasting for at least 8 hours.   BUN 23 8 - 23 mg/dL   Creatinine 0.85 0.61 - 1.24 mg/dL   Calcium 8.7 (L) 8.9 - 10.3 mg/dL   Total Protein 6.3 (L) 6.5 - 8.1 g/dL   Albumin 2.8 (L) 3.5 - 5.0 g/dL   AST 23 15 - 41 U/L   ALT 34 0 - 44 U/L   Alkaline Phosphatase 63 38 - 126 U/L   Total Bilirubin 0.4 0.3 - 1.2 mg/dL   GFR, Est Non Af Am >60 >60 mL/min   GFR, Est AFR Am >60 >60 mL/min   Anion gap 6 5 - 15    Comment: Performed at [facility], [place] [address], [place], [place] [address]  TSH     Status: None   Collection Time: 02/29/20 10:09 AM  Result Value Ref Range   TSH 2.343 0.320 - 4.118 uIU/mL    Comment: Performed at [facility], [place] [address] [place], [place] [address]    Assessment/Plan: 1. Pneumonia of left lower lobe due to infectious organism Clinically resolved. Lungs CTAB. No residual symptoms except very mild fatigue likely due to deconditioning during hospitalization. Has HH PT starting tomorrow which I feel would help. Continue incentive spirometry. Continue Trelegy per Pulmonology. Follow-up with Pulmonology and Immunology as scheduled.   2. Need for pneumococcal vaccine - Pneumococcal conjugate vaccine 13-valent IM  This visit occurred during the SARS-CoV-2 public health emergency.  Safety protocols were in place, including screening questions prior to the visit, additional usage of staff PPE, and extensive cleaning of exam room while observing appropriate contact time as indicated for disinfecting solutions.      Leeanne Rio, PA-C

## 2020-03-02 DIAGNOSIS — I2510 Atherosclerotic heart disease of native coronary artery without angina pectoris: Secondary | ICD-10-CM | POA: Diagnosis not present

## 2020-03-02 DIAGNOSIS — E43 Unspecified severe protein-calorie malnutrition: Secondary | ICD-10-CM | POA: Diagnosis not present

## 2020-03-02 DIAGNOSIS — C3490 Malignant neoplasm of unspecified part of unspecified bronchus or lung: Secondary | ICD-10-CM | POA: Diagnosis not present

## 2020-03-02 DIAGNOSIS — J690 Pneumonitis due to inhalation of food and vomit: Secondary | ICD-10-CM | POA: Diagnosis not present

## 2020-03-02 DIAGNOSIS — J440 Chronic obstructive pulmonary disease with acute lower respiratory infection: Secondary | ICD-10-CM | POA: Diagnosis not present

## 2020-03-02 DIAGNOSIS — C7951 Secondary malignant neoplasm of bone: Secondary | ICD-10-CM | POA: Diagnosis not present

## 2020-03-03 DIAGNOSIS — C3490 Malignant neoplasm of unspecified part of unspecified bronchus or lung: Secondary | ICD-10-CM | POA: Diagnosis not present

## 2020-03-03 DIAGNOSIS — E43 Unspecified severe protein-calorie malnutrition: Secondary | ICD-10-CM | POA: Diagnosis not present

## 2020-03-03 DIAGNOSIS — J440 Chronic obstructive pulmonary disease with acute lower respiratory infection: Secondary | ICD-10-CM | POA: Diagnosis not present

## 2020-03-03 DIAGNOSIS — C7951 Secondary malignant neoplasm of bone: Secondary | ICD-10-CM | POA: Diagnosis not present

## 2020-03-03 DIAGNOSIS — J690 Pneumonitis due to inhalation of food and vomit: Secondary | ICD-10-CM | POA: Diagnosis not present

## 2020-03-03 DIAGNOSIS — I2510 Atherosclerotic heart disease of native coronary artery without angina pectoris: Secondary | ICD-10-CM | POA: Diagnosis not present

## 2020-03-06 ENCOUNTER — Other Ambulatory Visit: Payer: No Typology Code available for payment source

## 2020-03-06 ENCOUNTER — Ambulatory Visit: Payer: No Typology Code available for payment source

## 2020-03-06 DIAGNOSIS — J690 Pneumonitis due to inhalation of food and vomit: Secondary | ICD-10-CM | POA: Diagnosis not present

## 2020-03-06 DIAGNOSIS — J440 Chronic obstructive pulmonary disease with acute lower respiratory infection: Secondary | ICD-10-CM | POA: Diagnosis not present

## 2020-03-06 DIAGNOSIS — C7951 Secondary malignant neoplasm of bone: Secondary | ICD-10-CM | POA: Diagnosis not present

## 2020-03-06 DIAGNOSIS — C3490 Malignant neoplasm of unspecified part of unspecified bronchus or lung: Secondary | ICD-10-CM | POA: Diagnosis not present

## 2020-03-06 DIAGNOSIS — I2510 Atherosclerotic heart disease of native coronary artery without angina pectoris: Secondary | ICD-10-CM | POA: Diagnosis not present

## 2020-03-06 DIAGNOSIS — E43 Unspecified severe protein-calorie malnutrition: Secondary | ICD-10-CM | POA: Diagnosis not present

## 2020-03-08 ENCOUNTER — Other Ambulatory Visit: Payer: Self-pay | Admitting: *Deleted

## 2020-03-08 ENCOUNTER — Telehealth: Payer: Self-pay | Admitting: Hematology

## 2020-03-08 DIAGNOSIS — C3492 Malignant neoplasm of unspecified part of left bronchus or lung: Secondary | ICD-10-CM

## 2020-03-08 MED ORDER — ONDANSETRON HCL 8 MG PO TABS: 8.0000 mg | ORAL_TABLET | Freq: Three times a day (TID) | ORAL | 1 refills | Status: DC | PRN

## 2020-03-08 MED ORDER — FENTANYL 50 MCG/HR TD PT72: 1.0000 | MEDICATED_PATCH | TRANSDERMAL | 0 refills | Status: DC

## 2020-03-08 NOTE — Telephone Encounter (Signed)
Wife called - requests refill of Fentanyl and zofran for patient

## 2020-03-08 NOTE — Telephone Encounter (Signed)
Scheduled per los, spoke with patient's spouse and patient will be notified.

## 2020-03-09 ENCOUNTER — Ambulatory Visit (HOSPITAL_COMMUNITY)
Admission: RE | Admit: 2020-03-09 | Discharge: 2020-03-09 | Disposition: A | Discharge status: Home / Self Care | Payer: No Typology Code available for payment source | Source: Ambulatory Visit | Attending: Hematology | Admitting: Hematology

## 2020-03-09 ENCOUNTER — Other Ambulatory Visit: Payer: Self-pay

## 2020-03-09 DIAGNOSIS — M899 Disorder of bone, unspecified: Secondary | ICD-10-CM | POA: Diagnosis not present

## 2020-03-09 MED ORDER — GADOBUTROL 1 MMOL/ML IV SOLN
5.0000 mL | Freq: Once | INTRAVENOUS | Status: AC | PRN
  Administered 2020-03-09: 5 mL via INTRAVENOUS

## 2020-03-13 ENCOUNTER — Other Ambulatory Visit: Payer: Self-pay | Admitting: Hematology

## 2020-03-13 NOTE — Telephone Encounter (Signed)
Please see refill.

## 2020-03-16 ENCOUNTER — Other Ambulatory Visit: Payer: Self-pay | Admitting: Cardiology

## 2020-03-16 DIAGNOSIS — C3490 Malignant neoplasm of unspecified part of unspecified bronchus or lung: Secondary | ICD-10-CM | POA: Diagnosis not present

## 2020-03-16 DIAGNOSIS — I2510 Atherosclerotic heart disease of native coronary artery without angina pectoris: Secondary | ICD-10-CM | POA: Diagnosis not present

## 2020-03-16 DIAGNOSIS — J690 Pneumonitis due to inhalation of food and vomit: Secondary | ICD-10-CM | POA: Diagnosis not present

## 2020-03-16 DIAGNOSIS — J440 Chronic obstructive pulmonary disease with acute lower respiratory infection: Secondary | ICD-10-CM | POA: Diagnosis not present

## 2020-03-16 DIAGNOSIS — E43 Unspecified severe protein-calorie malnutrition: Secondary | ICD-10-CM | POA: Diagnosis not present

## 2020-03-16 DIAGNOSIS — C7951 Secondary malignant neoplasm of bone: Secondary | ICD-10-CM | POA: Diagnosis not present

## 2020-03-19 NOTE — Telephone Encounter (Signed)
Rx(s) sent to pharmacy electronically.  

## 2020-03-21 ENCOUNTER — Ambulatory Visit: Payer: No Typology Code available for payment source

## 2020-03-21 ENCOUNTER — Inpatient Hospital Stay: Payer: No Typology Code available for payment source

## 2020-03-21 ENCOUNTER — Other Ambulatory Visit: Payer: No Typology Code available for payment source

## 2020-03-21 ENCOUNTER — Inpatient Hospital Stay: Payer: No Typology Code available for payment source | Attending: Hematology

## 2020-03-21 ENCOUNTER — Other Ambulatory Visit: Payer: Self-pay

## 2020-03-21 ENCOUNTER — Inpatient Hospital Stay (HOSPITAL_BASED_OUTPATIENT_CLINIC_OR_DEPARTMENT_OTHER): Payer: No Typology Code available for payment source | Admitting: Hematology

## 2020-03-21 VITALS — BP 102/62 | HR 62 | Temp 97.7°F | Resp 18 | Ht 71.0 in | Wt 121.4 lb

## 2020-03-21 DIAGNOSIS — Z8249 Family history of ischemic heart disease and other diseases of the circulatory system: Secondary | ICD-10-CM | POA: Insufficient documentation

## 2020-03-21 DIAGNOSIS — E785 Hyperlipidemia, unspecified: Secondary | ICD-10-CM | POA: Insufficient documentation

## 2020-03-21 DIAGNOSIS — Z85828 Personal history of other malignant neoplasm of skin: Secondary | ICD-10-CM | POA: Insufficient documentation

## 2020-03-21 DIAGNOSIS — Z5112 Encounter for antineoplastic immunotherapy: Secondary | ICD-10-CM | POA: Insufficient documentation

## 2020-03-21 DIAGNOSIS — Z808 Family history of malignant neoplasm of other organs or systems: Secondary | ICD-10-CM | POA: Diagnosis not present

## 2020-03-21 DIAGNOSIS — C3412 Malignant neoplasm of upper lobe, left bronchus or lung: Secondary | ICD-10-CM | POA: Diagnosis not present

## 2020-03-21 DIAGNOSIS — Z7189 Other specified counseling: Secondary | ICD-10-CM

## 2020-03-21 DIAGNOSIS — C7951 Secondary malignant neoplasm of bone: Secondary | ICD-10-CM

## 2020-03-21 DIAGNOSIS — Z79899 Other long term (current) drug therapy: Secondary | ICD-10-CM | POA: Insufficient documentation

## 2020-03-21 DIAGNOSIS — F17210 Nicotine dependence, cigarettes, uncomplicated: Secondary | ICD-10-CM | POA: Diagnosis not present

## 2020-03-21 DIAGNOSIS — Z833 Family history of diabetes mellitus: Secondary | ICD-10-CM | POA: Insufficient documentation

## 2020-03-21 DIAGNOSIS — C3492 Malignant neoplasm of unspecified part of left bronchus or lung: Secondary | ICD-10-CM

## 2020-03-21 DIAGNOSIS — I10 Essential (primary) hypertension: Secondary | ICD-10-CM | POA: Insufficient documentation

## 2020-03-21 DIAGNOSIS — I252 Old myocardial infarction: Secondary | ICD-10-CM | POA: Insufficient documentation

## 2020-03-21 DIAGNOSIS — Z8551 Personal history of malignant neoplasm of bladder: Secondary | ICD-10-CM | POA: Diagnosis not present

## 2020-03-21 DIAGNOSIS — Z801 Family history of malignant neoplasm of trachea, bronchus and lung: Secondary | ICD-10-CM | POA: Diagnosis not present

## 2020-03-21 LAB — CMP (CANCER CENTER ONLY)
ALT: 16 U/L (ref 0–44)
AST: 20 U/L (ref 15–41)
Albumin: 3.3 g/dL — ABNORMAL LOW (ref 3.5–5.0)
Alkaline Phosphatase: 56 U/L (ref 38–126)
Anion gap: 9 (ref 5–15)
BUN: 22 mg/dL (ref 8–23)
CO2: 26 mmol/L (ref 22–32)
Calcium: 9.3 mg/dL (ref 8.9–10.3)
Chloride: 106 mmol/L (ref 98–111)
Creatinine: 0.81 mg/dL (ref 0.61–1.24)
GFR, Est AFR Am: >60 mL/min (ref >60)
GFR, Estimated: >60 mL/min (ref >60)
Glucose, Bld: 84 mg/dL (ref 70–99)
Potassium: 4.3 mmol/L (ref 3.5–5.1)
Sodium: 141 mmol/L (ref 135–145)
Total Bilirubin: 0.5 mg/dL (ref 0.3–1.2)
Total Protein: 6.5 g/dL (ref 6.5–8.1)

## 2020-03-21 LAB — CBC WITH DIFFERENTIAL/PLATELET
Abs Immature Granulocytes: 0.03 10*3/uL (ref 0.00–0.07)
Basophils Absolute: 0 10*3/uL (ref 0.0–0.1)
Basophils Relative: 0 %
Eosinophils Absolute: 0.3 10*3/uL (ref 0.0–0.5)
Eosinophils Relative: 4 %
HCT: 33.7 % — ABNORMAL LOW (ref 39.0–52.0)
Hemoglobin: 11 g/dL — ABNORMAL LOW (ref 13.0–17.0)
Immature Granulocytes: 0 %
Lymphocytes Relative: 8 %
Lymphs Abs: 0.6 10*3/uL — ABNORMAL LOW (ref 0.7–4.0)
MCH: 32.2 pg (ref 26.0–34.0)
MCHC: 32.6 g/dL (ref 30.0–36.0)
MCV: 98.5 fL (ref 80.0–100.0)
Monocytes Absolute: 0.7 10*3/uL (ref 0.1–1.0)
Monocytes Relative: 9 %
Neutro Abs: 6.1 10*3/uL (ref 1.7–7.7)
Neutrophils Relative %: 79 %
Platelets: 155 10*3/uL (ref 150–400)
RBC: 3.42 MIL/uL — ABNORMAL LOW (ref 4.22–5.81)
RDW: 14.6 % (ref 11.5–15.5)
WBC: 7.7 10*3/uL (ref 4.0–10.5)
nRBC: 0 % (ref 0.0–0.2)

## 2020-03-21 MED ORDER — SODIUM CHLORIDE 0.9% FLUSH
10.0000 mL | INTRAVENOUS | Status: DC | PRN
  Administered 2020-03-21: 10 mL
  Filled 2020-03-21: qty 10

## 2020-03-21 MED ORDER — FAMOTIDINE IN NACL 20-0.9 MG/50ML-% IV SOLN
20.0000 mg | Freq: Once | INTRAVENOUS | Status: AC
  Administered 2020-03-21: 20 mg via INTRAVENOUS

## 2020-03-21 MED ORDER — DIPHENHYDRAMINE HCL 50 MG/ML IJ SOLN
25.0000 mg | Freq: Once | INTRAMUSCULAR | Status: AC
  Administered 2020-03-21: 25 mg via INTRAVENOUS

## 2020-03-21 MED ORDER — SODIUM CHLORIDE 0.9 % IV SOLN
Freq: Once | INTRAVENOUS | Status: AC
  Filled 2020-03-21: qty 250

## 2020-03-21 MED ORDER — FAMOTIDINE IN NACL 20-0.9 MG/50ML-% IV SOLN
INTRAVENOUS | Status: AC
  Filled 2020-03-21: qty 50

## 2020-03-21 MED ORDER — HEPARIN SOD (PORK) LOCK FLUSH 100 UNIT/ML IV SOLN
500.0000 [IU] | Freq: Once | INTRAVENOUS | Status: AC | PRN
  Administered 2020-03-21: 500 [IU]
  Filled 2020-03-21: qty 5

## 2020-03-21 MED ORDER — ATROPINE SULFATE 1 MG/ML IJ SOLN
INTRAMUSCULAR | Status: AC
  Filled 2020-03-21: qty 1

## 2020-03-21 MED ORDER — SODIUM CHLORIDE 0.9 % IV SOLN
200.0000 mg | Freq: Once | INTRAVENOUS | Status: AC
  Administered 2020-03-21: 200 mg via INTRAVENOUS
  Filled 2020-03-21: qty 8

## 2020-03-21 MED ORDER — DIPHENHYDRAMINE HCL 50 MG/ML IJ SOLN
INTRAMUSCULAR | Status: AC
  Filled 2020-03-21: qty 1

## 2020-03-21 NOTE — Patient Instructions (Signed)

## 2020-03-21 NOTE — Patient Instructions (Signed)
[facility] Discharge Instructions for Patients Receiving Chemotherapy  Today you received the following chemotherapy agents: pembrolizumab.  To help prevent nausea and vomiting after your treatment, we encourage you to take your nausea medication as directed.   If you develop nausea and vomiting that is not controlled by your nausea medication, call the clinic.   BELOW ARE SYMPTOMS THAT SHOULD BE REPORTED IMMEDIATELY:  *FEVER GREATER THAN 100.5 F  *CHILLS WITH OR WITHOUT FEVER  NAUSEA AND VOMITING THAT IS NOT CONTROLLED WITH YOUR NAUSEA MEDICATION  *UNUSUAL SHORTNESS OF BREATH  *UNUSUAL BRUISING OR BLEEDING  TENDERNESS IN MOUTH AND THROAT WITH OR WITHOUT PRESENCE OF ULCERS  *URINARY PROBLEMS  *BOWEL PROBLEMS  UNUSUAL RASH Items with * indicate a potential emergency and should be followed up as soon as possible.  Feel free to call the clinic should you have any questions or concerns. The clinic phone number is [phone].  Please show the CHEMO ALERT CARD at check-in to the Emergency Department and triage nurse.   
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
## 2020-03-26 DIAGNOSIS — Z8551 Personal history of malignant neoplasm of bladder: Secondary | ICD-10-CM | POA: Diagnosis not present

## 2020-03-27 ENCOUNTER — Telehealth: Payer: Self-pay | Admitting: Hematology

## 2020-03-27 NOTE — Telephone Encounter (Signed)
Scheduled per 07/14 los, spoke with patient's wife and patient will be notified of upcoming appointment.

## 2020-04-09 ENCOUNTER — Other Ambulatory Visit: Payer: Self-pay | Admitting: *Deleted

## 2020-04-09 DIAGNOSIS — C3492 Malignant neoplasm of unspecified part of left bronchus or lung: Secondary | ICD-10-CM

## 2020-04-09 MED ORDER — ONDANSETRON HCL 8 MG PO TABS: 8.0000 mg | ORAL_TABLET | Freq: Three times a day (TID) | ORAL | 1 refills | Status: DC | PRN

## 2020-04-09 MED ORDER — FENTANYL 50 MCG/HR TD PT72: 1.0000 | MEDICATED_PATCH | TRANSDERMAL | 0 refills | Status: DC

## 2020-04-09 MED ORDER — MAGNESIUM OXIDE 400 MG PO TABS: 1.0000 | ORAL_TABLET | Freq: Every day | ORAL | 0 refills | Status: DC

## 2020-04-09 NOTE — Telephone Encounter (Signed)
Patient wife called - needs refill of Fentanyl patch, ondansetron and magnesium.

## 2020-04-09 NOTE — Telephone Encounter (Signed)
Wife requested refill of fentanyl patch. Routed to Dr. [name]

## 2020-04-11 ENCOUNTER — Inpatient Hospital Stay: Payer: No Typology Code available for payment source | Attending: Hematology

## 2020-04-11 ENCOUNTER — Inpatient Hospital Stay: Payer: No Typology Code available for payment source

## 2020-04-11 ENCOUNTER — Inpatient Hospital Stay (HOSPITAL_BASED_OUTPATIENT_CLINIC_OR_DEPARTMENT_OTHER): Payer: No Typology Code available for payment source | Admitting: Hematology

## 2020-04-11 ENCOUNTER — Other Ambulatory Visit: Payer: Self-pay

## 2020-04-11 VITALS — BP 117/54 | HR 60 | Temp 97.9°F | Resp 18 | Ht 71.0 in | Wt 123.8 lb

## 2020-04-11 DIAGNOSIS — C3412 Malignant neoplasm of upper lobe, left bronchus or lung: Secondary | ICD-10-CM | POA: Diagnosis present

## 2020-04-11 DIAGNOSIS — Z79899 Other long term (current) drug therapy: Secondary | ICD-10-CM | POA: Insufficient documentation

## 2020-04-11 DIAGNOSIS — I10 Essential (primary) hypertension: Secondary | ICD-10-CM | POA: Diagnosis not present

## 2020-04-11 DIAGNOSIS — Z801 Family history of malignant neoplasm of trachea, bronchus and lung: Secondary | ICD-10-CM | POA: Insufficient documentation

## 2020-04-11 DIAGNOSIS — Z833 Family history of diabetes mellitus: Secondary | ICD-10-CM | POA: Diagnosis not present

## 2020-04-11 DIAGNOSIS — C3492 Malignant neoplasm of unspecified part of left bronchus or lung: Secondary | ICD-10-CM

## 2020-04-11 DIAGNOSIS — Z8249 Family history of ischemic heart disease and other diseases of the circulatory system: Secondary | ICD-10-CM | POA: Diagnosis not present

## 2020-04-11 DIAGNOSIS — F17210 Nicotine dependence, cigarettes, uncomplicated: Secondary | ICD-10-CM | POA: Diagnosis not present

## 2020-04-11 DIAGNOSIS — Z85828 Personal history of other malignant neoplasm of skin: Secondary | ICD-10-CM | POA: Insufficient documentation

## 2020-04-11 DIAGNOSIS — Z83438 Family history of other disorder of lipoprotein metabolism and other lipidemia: Secondary | ICD-10-CM | POA: Insufficient documentation

## 2020-04-11 DIAGNOSIS — Z7189 Other specified counseling: Secondary | ICD-10-CM

## 2020-04-11 DIAGNOSIS — Z5112 Encounter for antineoplastic immunotherapy: Secondary | ICD-10-CM | POA: Diagnosis not present

## 2020-04-11 DIAGNOSIS — C7951 Secondary malignant neoplasm of bone: Secondary | ICD-10-CM

## 2020-04-11 DIAGNOSIS — G893 Neoplasm related pain (acute) (chronic): Secondary | ICD-10-CM | POA: Insufficient documentation

## 2020-04-11 DIAGNOSIS — I252 Old myocardial infarction: Secondary | ICD-10-CM | POA: Insufficient documentation

## 2020-04-11 LAB — CMP (CANCER CENTER ONLY)
ALT: 14 U/L (ref 0–44)
AST: 20 U/L (ref 15–41)
Albumin: 3.5 g/dL (ref 3.5–5.0)
Alkaline Phosphatase: 60 U/L (ref 38–126)
Anion gap: 6 (ref 5–15)
BUN: 22 mg/dL (ref 8–23)
CO2: 27 mmol/L (ref 22–32)
Calcium: 9.9 mg/dL (ref 8.9–10.3)
Chloride: 106 mmol/L (ref 98–111)
Creatinine: 0.82 mg/dL (ref 0.61–1.24)
GFR, Est AFR Am: >60 mL/min (ref >60)
GFR, Estimated: >60 mL/min (ref >60)
Glucose, Bld: 95 mg/dL (ref 70–99)
Potassium: 4.1 mmol/L (ref 3.5–5.1)
Sodium: 139 mmol/L (ref 135–145)
Total Bilirubin: 0.4 mg/dL (ref 0.3–1.2)
Total Protein: 6.6 g/dL (ref 6.5–8.1)

## 2020-04-11 LAB — CBC WITH DIFFERENTIAL/PLATELET
Abs Immature Granulocytes: 0.02 10*3/uL (ref 0.00–0.07)
Basophils Absolute: 0 10*3/uL (ref 0.0–0.1)
Basophils Relative: 1 %
Eosinophils Absolute: 0.2 10*3/uL (ref 0.0–0.5)
Eosinophils Relative: 3 %
HCT: 34.8 % — ABNORMAL LOW (ref 39.0–52.0)
Hemoglobin: 11.6 g/dL — ABNORMAL LOW (ref 13.0–17.0)
Immature Granulocytes: 0 %
Lymphocytes Relative: 7 %
Lymphs Abs: 0.5 10*3/uL — ABNORMAL LOW (ref 0.7–4.0)
MCH: 33.3 pg (ref 26.0–34.0)
MCHC: 33.3 g/dL (ref 30.0–36.0)
MCV: 100 fL (ref 80.0–100.0)
Monocytes Absolute: 0.6 10*3/uL (ref 0.1–1.0)
Monocytes Relative: 8 %
Neutro Abs: 6.3 10*3/uL (ref 1.7–7.7)
Neutrophils Relative %: 81 %
Platelets: 156 10*3/uL (ref 150–400)
RBC: 3.48 MIL/uL — ABNORMAL LOW (ref 4.22–5.81)
RDW: 14.7 % (ref 11.5–15.5)
WBC: 7.8 10*3/uL (ref 4.0–10.5)
nRBC: 0 % (ref 0.0–0.2)

## 2020-04-11 MED ORDER — DENOSUMAB 120 MG/1.7ML ~~LOC~~ SOLN
120.0000 mg | Freq: Once | SUBCUTANEOUS | Status: AC
  Administered 2020-04-11: 120 mg via SUBCUTANEOUS

## 2020-04-11 MED ORDER — HEPARIN SOD (PORK) LOCK FLUSH 100 UNIT/ML IV SOLN
500.0000 [IU] | Freq: Once | INTRAVENOUS | Status: AC | PRN
  Administered 2020-04-11: 500 [IU]
  Filled 2020-04-11: qty 5

## 2020-04-11 MED ORDER — FAMOTIDINE IN NACL 20-0.9 MG/50ML-% IV SOLN
20.0000 mg | Freq: Once | INTRAVENOUS | Status: AC
  Administered 2020-04-11: 20 mg via INTRAVENOUS

## 2020-04-11 MED ORDER — DIPHENHYDRAMINE HCL 50 MG/ML IJ SOLN
25.0000 mg | Freq: Once | INTRAMUSCULAR | Status: AC
  Administered 2020-04-11: 25 mg via INTRAVENOUS

## 2020-04-11 MED ORDER — SODIUM CHLORIDE 0.9 % IV SOLN
200.0000 mg | Freq: Once | INTRAVENOUS | Status: AC
  Administered 2020-04-11: 200 mg via INTRAVENOUS
  Filled 2020-04-11: qty 8

## 2020-04-11 MED ORDER — FAMOTIDINE IN NACL 20-0.9 MG/50ML-% IV SOLN
INTRAVENOUS | Status: AC
  Filled 2020-04-11: qty 50

## 2020-04-11 MED ORDER — SODIUM CHLORIDE 0.9% FLUSH
10.0000 mL | INTRAVENOUS | Status: DC | PRN
  Administered 2020-04-11: 10 mL
  Filled 2020-04-11: qty 10

## 2020-04-11 MED ORDER — DENOSUMAB 120 MG/1.7ML ~~LOC~~ SOLN
SUBCUTANEOUS | Status: AC
  Filled 2020-04-11: qty 1.7

## 2020-04-11 MED ORDER — DIPHENHYDRAMINE HCL 50 MG/ML IJ SOLN
INTRAMUSCULAR | Status: AC
  Filled 2020-04-11: qty 1

## 2020-04-11 MED ORDER — SODIUM CHLORIDE 0.9 % IV SOLN
Freq: Once | INTRAVENOUS | Status: AC
  Filled 2020-04-11: qty 250

## 2020-04-11 NOTE — Progress Notes (Signed)
HEMATOLOGY/ONCOLOGY CLINIC NOTE  Date of Service: 04/11/2020  Patient Care Team: [name] as PCP - General (Family Medicine) Lorretta Harp, MD as PCP - Cardiology (Cardiology) Brunetta Genera, MD as Consulting Physician (Hematology) Margaretha Seeds, MD as Consulting Physician (Pulmonary Disease) [name], MD as Consulting Physician (Urology)  CHIEF COMPLAINTS/PURPOSE OF CONSULTATION:  F/u for continued management of metastatic Squamous cell lung cancer  HISTORY OF PRESENTING ILLNESS:    Kyle Foster is a wonderful 74 y.o. male who has been referred to [place] by Dr. Karie Kirks for evaluation and management of Lung Mass concerning for primary lung cancer.   he pt reports he has been having back pain on and off for about 3 months and was being worked up at the [facility] in [place] by his PCP . He reports it was being maanged as MSK pain and he was referred to PT but the symptoms got progressively worse. He presented to the ED on 10/14/18 with SOB and midsternal chest pain, neck pain, and back pain which had been constant for the last 2-3 weeks.   Of note prior to the patient's visit today, pt has had a CTA Chest completed on 10/14/18 with results revealing 5.6 cm left upper lobe lesion with chest wall and thoracic spine invasion, possible epidural extension of tumor. 2. 2.7 cm left hilar mass occluding the left lower lobe pulmonary artery branch and probably attenuating or occluding the inferior left pulmonary vein. 3. Left lower lobe pulmonary nodules possibly metastatic. 4. Negative for acute PE or thoracic aortic dissection. 5. Coronary and Aortic Atherosclerosis.  Most recent lab results (10/15/18) of CBC is as follows: all values are WNL except for RBC at 3.76, [name], [name], Glucose at 111.  He subsequently had an MRI of the T spine which showed Large cavitary mass arising in the superior aspect of the left hilum and extending into the posteromedial  aspect of the left upper lobe invading the left side of the T5 and T6 vertebra and extending into the spinal canal with a slight mass effect upon the spinal cord. There is a slight pathologic compression fracture of the T6 vertebral body. 2. Probable small metastasis in the T8 vertebral body. 3. Metastatic pulmonary nodules at the left lung base posterolaterally. 4. The mass destroys the posterior aspects of the left fifth and sixth ribs.  Patient has had a h/o localized bladder cancer s/p TURBT in 2009. 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  Interval History:   Kyle Foster returns today for management and evaluation of his Squamous Cell Carcinoma Lung Cancer and maintenance Pembrolizumab. The patient's last visit with [place] was on 03/21/2020. The pt reports that he is doing well overall.  The pt reports that he has felt stable and has no new concerns. He continues using a Trelegy inhaler and has no new cough or issues breathing. He is following with his Dermatologist every 6 months.   Lab results today (04/11/20) of CBC w/diff and CMP is as follows: all values are WNL except for RBC at 3.48, Hgb at 11.6, HCT at 34.8, Lymphs Abs at 0.5K.  On review of systems, pt denies SOB, cough, chest pain, rash, diarrhea, skin lesions, abdominal pain and any other symptoms.   Past Medical History:  Diagnosis Date  . Allergy   . Bladder cancer ([place]) 10/2008  . CAD (coronary artery  disease)   . Cancer of parotid gland ([place]) 12/2009   "squamous cell cancer attached to it; took the gland out"  . Depression   . GERD (gastroesophageal reflux disease)   . History of chickenpox   . History of kidney stones   . Hyperlipidemia   . Hypertension   . Myocardial infarction ([place]) 06/2008  . Recurrent upper respiratory infection (URI)    09/01/11 saw PCP - Kathryne Eriksson , antibiotic  and prednisone   . Skin cancer    "cut & burned off arms, hands,  face, neck" (06/14/2018)  . Squamous cell carcinoma of lung ([place]) dx'd 10/2018   chemo.xrt. immunotherapy    SURGICAL HISTORY: Past Surgical History:  Procedure Laterality Date  . CATARACT EXTRACTION W/ INTRAOCULAR LENS IMPLANT Right 12/2007  . CORONARY ANGIOPLASTY WITH STENT PLACEMENT  06/2008   "3 stents" (06/14/2018)  . CYSTOSCOPY W/ RETROGRADES  09/22/2011   Procedure: CYSTOSCOPY WITH RETROGRADE PYELOGRAM;  Surgeon: Bernestine Amass, MD;  Location: [facility];  Service: Urology;  Laterality: Left;  Cystoscopy left Retrograde Pyelogram      (c-arm)   . CYSTOSCOPY WITH BIOPSY  09/22/2011   Procedure: CYSTOSCOPY WITH BIOPSY;  Surgeon: Bernestine Amass, MD;  Location: [facility];  Service: Urology;  Laterality: N/A;   Biopsy  . EXCISIONAL HEMORRHOIDECTOMY  ~ 2006  . EYE SURGERY Left 04/2011   "reconstruction; gold weight in eye lid " (06/14/2018)  . FOOT NEUROMA SURGERY Left   . INGUINAL HERNIA REPAIR Right 02/2018  . IR IMAGING GUIDED PORT INSERTION  11/23/2018  . NM MYOCAR PERF WALL MOTION  07/11/2008   MILD ISCHEMIA IN THE BASL INFERIOR, MID INFERIOR & APICAL INFERIOR REGIONS  . 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 SKIN CANCER EXCISION     "arms, hands, face, neck" (06/14/2018)  . TRANSURETHRAL RESECTION OF BLADDER TUMOR WITH GYRUS (TURBT-GYRUS)  2010    SOCIAL HISTORY: Social History   Socioeconomic History  . Marital status: Married    Spouse name: Not on file  . Number of children: Not on file  . Years of education: Not on file  . Highest education level: Not on file  Occupational History  . Not on file  Tobacco Use  . Smoking status: Current Every Day Smoker    Packs/day: 0.50    Years: 52.00    Pack years: 26.00    Types: Cigarettes    Start date: 55  . Smokeless tobacco: Never Used  . Tobacco comment: 03/21/19- smoking 10 cigs a day   Vaping Use  . Vaping Use: Never used  Substance and Sexual Activity  . Alcohol  use: Not Currently  . Drug use: Not Currently  . Sexual activity: Not on file  Other Topics Concern  . Not on file  Social History Narrative  . Not on file   Social Determinants of Health   Financial Resource Strain:   . Difficulty of Paying Living Expenses:   Food Insecurity:   . Worried About Charity fundraiser in the Last Year:   . Arboriculturist in the Last Year:   Transportation Needs:   . Film/video editor (Medical):   Marland Kitchen Lack of Transportation (Non-Medical):   Physical Activity:   . Days of Exercise per Week:   . Minutes of Exercise per Session:   Stress:   . Feeling of Stress :   Social Connections:   .  Frequency of Communication with Friends and Family:   . Frequency of Social Gatherings with Friends and Family:   . Attends Religious Services:   . Active Member of Clubs or Organizations:   . Attends Archivist Meetings:   Marland Kitchen Marital Status:   Intimate Partner Violence:   . Fear of Current or Ex-Partner:   . Emotionally Abused:   Marland Kitchen Physically Abused:   . Sexually Abused:     FAMILY HISTORY: Family History  Problem Relation Age of Onset  . Heart attack Mother 69  . Cancer Mother        Lung  . Diabetes Mother   . Heart disease Mother   . Hypertension Mother   . Stroke Father 38  . Cancer Father        Lung  . Brain cancer Brother   . Cancer Sister        Melanoma of great Toe  . Hyperlipidemia Sister     ALLERGIES:  is allergic to chlorhexidine and codeine.  MEDICATIONS:  Current Outpatient Medications  Medication Sig Dispense Refill  . atorvastatin (LIPITOR) 80 MG tablet Take 1 tablet (80 mg total) by mouth daily. NEEDS APPOINTMENT FOR FUTURE REFILLS 90 tablet 0  . calcium carbonate (TUMS - DOSED IN MG ELEMENTAL CALCIUM) 500 MG chewable tablet Chew 1 tablet (200 mg of elemental calcium total) by mouth 3 (three) times daily with meals. (Patient taking differently: Chew 1 tablet by mouth 3 (three) times daily as needed for indigestion.  ) 30 tablet 3  . Cholecalciferol (VITAMIN D) 2000 UNITS tablet Take 2,000 Units by mouth 2 (two) times daily.    . clopidogrel (PLAVIX) 75 MG tablet Take 1 tablet (75 mg total) by mouth daily. NEEDS APPOINTMENT FOR FUTURE REFILLS 90 tablet 0  . DENTA 5000 PLUS 1.1 % CREA dental cream Place 1 application onto teeth See admin instructions.     Marland Kitchen dronabinol (MARINOL) 5 MG capsule Take 1 capsule (5 mg total) by mouth 2 (two) times daily before lunch and supper. (Patient taking differently: Take 5 mg by mouth every evening. ) 60 capsule 0  . DULoxetine (CYMBALTA) 30 MG capsule TAKE 1 CAPSULE BY MOUTH EVERY DAY 90 capsule 2  . fentaNYL (DURAGESIC) 50 MCG/HR Place 1 patch onto the skin every 3 (three) days. 10 patch 0  . Hypromellose (ARTIFICIAL TEARS OP) Place 1 drop into both eyes at bedtime.     . lidocaine-prilocaine (EMLA) cream Apply 1 application topically as needed. (Patient taking differently: Apply 1 application topically as needed (access port). ) 30 g 1  . LORazepam (ATIVAN) 0.5 MG tablet Take 1 tablet (0.5 mg total) by mouth every 6 (six) hours as needed (Nausea or vomiting). 30 tablet 0  . magnesium oxide (MAG-OX) 400 MG tablet Take 1 tablet (400 mg total) by mouth daily. 90 tablet 0  . metoprolol tartrate (LOPRESSOR) 25 MG tablet Take 1 tablet (25 mg total) by mouth 2 (two) times daily. NEEDS APPOINTMENT FOR FUTURE REFILLS 180 tablet 0  . morphine (MSIR) 15 MG tablet Take 1-2 tablets (15-30 mg total) by mouth every 4 (four) hours as needed for moderate pain or severe pain. 120 tablet 0  . nicotine (NICODERM CQ - DOSED IN MG/24 HR) 7 mg/24hr patch Place 1 patch (7 mg total) onto the skin daily. (Patient not taking: Reported on 03/01/2020) 28 patch 0  . ondansetron (ZOFRAN) 8 MG tablet Take 1 tablet (8 mg total) by mouth every 8 (eight) hours  as needed for nausea or vomiting. 30 tablet 1  . pantoprazole (PROTONIX) 40 MG tablet Take 1 tablet (40 mg total) by mouth daily. 90 tablet 3  . TRELEGY  ELLIPTA 100-62.5-25 MCG/INH AEPB TAKE 1 PUFF BY MOUTH EVERY DAY (Patient taking differently: Take 1 puff by mouth daily. ) 60 each 6  . vitamin B-12 (CYANOCOBALAMIN) 100 MCG tablet Take 100 mcg by mouth in the morning and at bedtime.     No current facility-administered medications for this visit.    REVIEW OF SYSTEMS:   A 10+ POINT REVIEW OF SYSTEMS WAS OBTAINED including neurology, dermatology, psychiatry, cardiac, respiratory, lymph, extremities, GI, GU, Musculoskeletal, constitutional, breasts, reproductive, HEENT.  All pertinent positives are noted in the HPI.  All others are negative.   PHYSICAL EXAMINATION: ECOG PERFORMANCE STATUS: 2 - Symptomatic, <50% confined to bed  Vitals:   04/11/20 1036  BP: (!) 117/54  Pulse: 60  Resp: 18  Temp: 97.9 F (36.6 C)  SpO2: 100%   Filed Weights   04/11/20 1036  Weight: 123 lb 12.8 oz (56.2 kg)   .Body mass index is 17.27 kg/m.   GENERAL:alert, in no acute distress and comfortable SKIN: no acute rashes, no significant lesions EYES: conjunctiva are pink and non-injected, sclera anicteric OROPHARYNX: MMM, no exudates, no oropharyngeal erythema or ulceration NECK: supple, no JVD LYMPH:  no palpable lymphadenopathy in the cervical, axillary or inguinal regions LUNGS: clear to auscultation b/l with normal respiratory effort HEART: regular rate & rhythm ABDOMEN:  normoactive bowel sounds , non tender, not distended. No palpable hepatosplenomegaly.  Extremity: no pedal edema PSYCH: alert & oriented x 3 with fluent speech NEURO: no focal motor/sensory deficits  LABORATORY DATA:  I have reviewed the data as listed  . CBC Latest Ref Rng & Units 04/11/2020 03/21/2020 02/29/2020  WBC 4.0 - 10.5 K/uL 7.8 7.7 10.5  Hemoglobin 13.0 - 17.0 g/dL 11.6(L) 11.0(L) 10.7(L)  Hematocrit 39 - 52 % 34.8(L) 33.7(L) 32.2(L)  Platelets 150 - 400 K/uL 156 155 292    . CMP Latest Ref Rng & Units 04/11/2020 03/21/2020 02/29/2020  Glucose 70 - 99 mg/dL 95 84  93  BUN 8 - 23 mg/dL 22 22 23   Creatinine 0.61 - 1.24 mg/dL 0.82 0.81 0.85  Sodium 135 - 145 mmol/L 139 141 137  Potassium 3.5 - 5.1 mmol/L 4.1 4.3 4.4  Chloride 98 - 111 mmol/L 106 106 102  CO2 22 - 32 mmol/L 27 26 29   Calcium 8.9 - 10.3 mg/dL 9.9 9.3 8.7(L)  Total Protein 6.5 - 8.1 g/dL 6.6 6.5 6.3(L)  Total Bilirubin 0.3 - 1.2 mg/dL 0.4 0.5 0.4  Alkaline Phos 38 - 126 U/L 60 56 63  AST 15 - 41 U/L 20 20 23   ALT 0 - 44 U/L 14 16 34        RADIOGRAPHIC STUDIES: I have personally reviewed the radiological images as listed and agreed with the findings in the report. No results found.   ASSESSMENT & PLAN:   74 y.o. male with  1. 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 10/19/18 Lung biopsy revealed Squamous Cell carcinoma S/p Palliative RT to LUL lung mass with 30Gy in 10 fractions between 10/22/18 and 11/03/18, due to impending cord compression  10/19/18 PDL-1 status report found 30% PDL-1 tumor proportion  11/06/18 MRI Brain revealed No intracranial metastatic disease identified.  2. Mild chronic microvascular ischemic changes and volume loss of the brain.  11/10/18 PET/CT revealed Locally advanced left lung mass, as detailed previously. 2. Left perihilar direct extension versus nodal metastasis. 3. No findings of hypermetabolic distant metastasis. 4. No findings of extrathoracic hypermetabolic metastasis.  02/09/19 CT Chest and Abdomen revealed "Today's study demonstrates a positive response to therapy with slight regression of the primary lesion in the superior segment of the left lower lobe which again demonstrates direct invasion of the adjacent posteromedial left chest wall and vertebral column, as detailed above. 2. However, there is a new hypovascular lesion in segment 7 of the liver. This is nonspecific and too small to characterize, but  given the development compared to prior studies, this is concerning for potential metastasis. This could be definitively characterized with MRI of the abdomen with and without IV gadolinium if clinically appropriate. 3. 5 mm nonobstructive calculus in the right renal collecting system. 4. Aortic atherosclerosis, in addition to 3 vessel coronary artery disease. Assessment for potential risk factor modification, dietary therapy or pharmacologic therapy may be warranted, if clinically Indicated. 5. There are calcifications of the aortic valve. Echocardiographic correlation for evaluation of potential valvular dysfunction may be warranted if clinically indicated. 6. Additional incidental findings, as above." Discussed that I suspect that the liver finding is likely a cyst, as progression through treatment would be unexpected. Nevertheless, will monitor this finding over time with future scans.  S/p 5 cycles of Carboplatin AUC of 5, 1107m/m2 Taxol, and Pembrolizumab with G-CSF support completed on 02/16/19.  03/08/19 ECHO which revealed LV EF of 55-60%  05/30/2019 CT Chest w/o contrast ([id number] which revealed "1. Stable post treatment related changes of prior radiation therapy in the left lung. Chronic destructive changes in T5 and T6 vertebrae as well as the posterior aspects of the left fifth and sixth ribs, similar to the prior study. 2. Diffuse bronchial wall thickening with mild to moderate centrilobular and paraseptal emphysema. 3. Aortic atherosclerosis, in addition to three-vessel coronary artery disease. Please note that although the presence of coronary artery calcium documents the presence of coronary artery disease, the severity of this disease and any potential stenosis cannot be assessed on this non-gated CT examination. Assessment for potential risk factor modification, dietary therapy or pharmacologic therapy may be warranted, if clinically indicated. 4. There are calcifications of the aortic  valve and mitral valve/annulus. Echocardiographic correlation for evaluation of potential valvular dysfunction may be warranted if clinically indicated. 5. 6 mm nonobstructive calculus in the upper pole collecting system of the right kidney. 6. Additional incidental findings, as above."  09/28/2019 CT Chest ([id number] revealed stable appearance of main mass, some radiation changes in the lung, no new lung nodules, no new bone changes  2. 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 Smoker 4. 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  Has regularly followed up with ENT Dr. JFenton Malling6. Cancer related pain 7. Recent Community acquired pneumonia  PLAN: -Discussed pt labwork today, 04/11/20; WBC & PLT are nml, anemia is improving, blood chemistries are nml -No lab or clinical evidence of Squamous Cell Carcinoma Lung Cancer progression at this time. -The pt has no prohibitive toxicities from continuing maintenance Pembrolizumab at this time. -Will continue to repeat CT scans every 4-6 months, unless new symptoms -Recommend pt  continue f/u with Dermatology as scheduled -Continue Xgeva every 6 weeks -Will see back in 6 weeks with labs   FOLLOW UP: PLease schedule next 4 doses of Pembrolizumab with portflush and labs. Xgeva every 6 weeks with every other treatment MD visit with every other treatment (every 6 weeks)    The total time spent in the appt was 20 minutes and more than 50% was on counseling and direct patient cares.  All of the patient's questions were answered with apparent satisfaction. The patient knows to call the clinic with any problems, questions or concerns.   Sullivan Lone MD [place] AAHIVMS [facility] Hematology/Oncology Physician [facility]  (Office):        [phone] (Work cell):  [phone] (Fax):           [phone]  04/11/2020 11:08 AM  I, Yevette Edwards, am acting as a scribe for Dr. Sullivan Lone.   .I have reviewed the above documentation for accuracy and completeness, and I agree with the above. Brunetta Genera MD

## 2020-04-11 NOTE — Patient Instructions (Signed)
[facility] Discharge Instructions for Patients Receiving Chemotherapy  Today you received the following immunotherapy agent: Pembrolizumab Beryle Flock)  To help prevent nausea and vomiting after your treatment, we encourage you to take your nausea medication as directed by your MD.   If you develop nausea and vomiting that is not controlled by your nausea medication, call the clinic.   BELOW ARE SYMPTOMS THAT SHOULD BE REPORTED IMMEDIATELY:  *FEVER GREATER THAN 100.5 F  *CHILLS WITH OR WITHOUT FEVER  NAUSEA AND VOMITING THAT IS NOT CONTROLLED WITH YOUR NAUSEA MEDICATION  *UNUSUAL SHORTNESS OF BREATH  *UNUSUAL BRUISING OR BLEEDING  TENDERNESS IN MOUTH AND THROAT WITH OR WITHOUT PRESENCE OF ULCERS  *URINARY PROBLEMS  *BOWEL PROBLEMS  UNUSUAL RASH Items with * indicate a potential emergency and should be followed up as soon as possible.  Feel free to call the clinic should you have any questions or concerns. The clinic phone number is [phone].  Please show the [facility] at check-in to the Emergency Department and triage nurse.  Denosumab injection What is this medicine? DENOSUMAB (den oh sue mab) slows bone breakdown. Prolia is used to treat osteoporosis in women after menopause and in men, and in people who are taking corticosteroids for 6 months or more. Delton See is used to treat a high calcium level due to cancer and to prevent bone fractures and other bone problems caused by multiple myeloma or cancer bone metastases. Delton See is also used to treat giant cell tumor of the bone. This medicine may be used for other purposes; ask your health care provider or pharmacist if you have questions. COMMON BRAND NAME(S): Prolia, XGEVA What should I tell my health care provider before I take this medicine? They need to know if you have any of these conditions:  dental disease  having surgery or tooth extraction  infection  kidney disease  low levels of  calcium or Vitamin D in the blood  malnutrition  on hemodialysis  skin conditions or sensitivity  thyroid or parathyroid disease  an unusual reaction to denosumab, other medicines, foods, dyes, or preservatives  pregnant or trying to get pregnant  breast-feeding How should I use this medicine? This medicine is for injection under the skin. It is given by a health care professional in a hospital or clinic setting. A special MedGuide will be given to you before each treatment. Be sure to read this information carefully each time. For Prolia, talk to your pediatrician regarding the use of this medicine in children. Special care may be needed. For Delton See, talk to your pediatrician regarding the use of this medicine in children. While this drug may be prescribed for children as young as 13 years for selected conditions, precautions do apply. Overdosage: If you think you have taken too much of this medicine contact a poison control center or emergency room at once. NOTE: This medicine is only for you. Do not share this medicine with others. What if I miss a dose? It is important not to miss your dose. Call your doctor or health care professional if you are unable to keep an appointment. What may interact with this medicine? Do not take this medicine with any of the following medications:  other medicines containing denosumab This medicine may also interact with the following medications:  medicines that lower your chance of fighting infection  steroid medicines like prednisone or cortisone This list may not describe all possible interactions. Give your health care provider a list of all  the medicines, herbs, non-prescription drugs, or dietary supplements you use. Also tell them if you smoke, drink alcohol, or use illegal drugs. Some items may interact with your medicine. What should I watch for while using this medicine? Visit your doctor or health care professional for regular checks on  your progress. Your doctor or health care professional may order blood tests and other tests to see how you are doing. Call your doctor or health care professional for advice if you get a fever, chills or sore throat, or other symptoms of a cold or flu. Do not treat yourself. This drug may decrease your body's ability to fight infection. Try to avoid being around people who are sick. You should make sure you get enough calcium and vitamin D while you are taking this medicine, unless your doctor tells you not to. Discuss the foods you eat and the vitamins you take with your health care professional. See your dentist regularly. Brush and floss your teeth as directed. Before you have any dental work done, tell your dentist you are receiving this medicine. Do not become pregnant while taking this medicine or for 5 months after stopping it. Talk with your doctor or health care professional about your birth control options while taking this medicine. Women should inform their doctor if they wish to become pregnant or think they might be pregnant. There is a potential for serious side effects to an unborn child. Talk to your health care professional or pharmacist for more information. What side effects may I notice from receiving this medicine? Side effects that you should report to your doctor or health care professional as soon as possible:  allergic reactions like skin rash, itching or hives, swelling of the face, lips, or tongue  bone pain  breathing problems  dizziness  jaw pain, especially after dental work  redness, blistering, peeling of the skin  signs and symptoms of infection like fever or chills; cough; sore throat; pain or trouble passing urine  signs of low calcium like fast heartbeat, muscle cramps or muscle pain; pain, tingling, numbness in the hands or feet; seizures  unusual bleeding or bruising  unusually weak or tired Side effects that usually do not require medical attention  (report to your doctor or health care professional if they continue or are bothersome):  constipation  diarrhea  headache  joint pain  loss of appetite  muscle pain  runny nose  tiredness  upset stomach This list may not describe all possible side effects. Call your doctor for medical advice about side effects. You may report side effects to FDA at 1-800-FDA-1088. Where should I keep my medicine? This medicine is only given in a clinic, doctor's office, or other health care setting and will not be stored at home. NOTE: This sheet is a summary. It may not cover all possible information. If you have questions about this medicine, talk to your doctor, pharmacist, or health care provider.  2020 Elsevier/Gold Standard (2018-01-01 16:10:44)   

## 2020-04-13 ENCOUNTER — Telehealth: Payer: Self-pay | Admitting: Hematology

## 2020-04-13 NOTE — Telephone Encounter (Signed)
Scheduled per los, called and spoke with patient's wife. Patient will be notified of upcoming appointments.

## 2020-04-19 DIAGNOSIS — L570 Actinic keratosis: Secondary | ICD-10-CM | POA: Diagnosis not present

## 2020-04-19 DIAGNOSIS — Z85828 Personal history of other malignant neoplasm of skin: Secondary | ICD-10-CM | POA: Diagnosis not present

## 2020-04-19 DIAGNOSIS — D485 Neoplasm of uncertain behavior of skin: Secondary | ICD-10-CM | POA: Diagnosis not present

## 2020-04-19 DIAGNOSIS — L821 Other seborrheic keratosis: Secondary | ICD-10-CM | POA: Diagnosis not present

## 2020-04-19 DIAGNOSIS — L814 Other melanin hyperpigmentation: Secondary | ICD-10-CM | POA: Diagnosis not present

## 2020-04-19 DIAGNOSIS — D0462 Carcinoma in situ of skin of left upper limb, including shoulder: Secondary | ICD-10-CM | POA: Diagnosis not present

## 2020-04-19 DIAGNOSIS — D1801 Hemangioma of skin and subcutaneous tissue: Secondary | ICD-10-CM | POA: Diagnosis not present

## 2020-04-19 DIAGNOSIS — D044 Carcinoma in situ of skin of scalp and neck: Secondary | ICD-10-CM | POA: Diagnosis not present

## 2020-04-19 DIAGNOSIS — L720 Epidermal cyst: Secondary | ICD-10-CM | POA: Diagnosis not present

## 2020-04-23 DIAGNOSIS — Z23 Encounter for immunization: Secondary | ICD-10-CM | POA: Diagnosis not present

## 2020-04-26 DIAGNOSIS — D0462 Carcinoma in situ of skin of left upper limb, including shoulder: Secondary | ICD-10-CM | POA: Diagnosis not present

## 2020-04-26 DIAGNOSIS — D044 Carcinoma in situ of skin of scalp and neck: Secondary | ICD-10-CM | POA: Diagnosis not present

## 2020-04-26 DIAGNOSIS — Z85828 Personal history of other malignant neoplasm of skin: Secondary | ICD-10-CM | POA: Diagnosis not present

## 2020-04-27 DIAGNOSIS — D044 Carcinoma in situ of skin of scalp and neck: Secondary | ICD-10-CM | POA: Diagnosis not present

## 2020-04-27 DIAGNOSIS — Z85828 Personal history of other malignant neoplasm of skin: Secondary | ICD-10-CM | POA: Diagnosis not present

## 2020-04-27 DIAGNOSIS — D0462 Carcinoma in situ of skin of left upper limb, including shoulder: Secondary | ICD-10-CM | POA: Diagnosis not present

## 2020-04-30 DIAGNOSIS — G510 Bell's palsy: Secondary | ICD-10-CM | POA: Diagnosis not present

## 2020-05-02 ENCOUNTER — Inpatient Hospital Stay: Payer: No Typology Code available for payment source

## 2020-05-02 ENCOUNTER — Other Ambulatory Visit: Payer: Self-pay

## 2020-05-02 VITALS — BP 109/61 | HR 55 | Temp 98.1°F | Resp 18

## 2020-05-02 DIAGNOSIS — C7951 Secondary malignant neoplasm of bone: Secondary | ICD-10-CM

## 2020-05-02 DIAGNOSIS — Z7189 Other specified counseling: Secondary | ICD-10-CM

## 2020-05-02 DIAGNOSIS — C3492 Malignant neoplasm of unspecified part of left bronchus or lung: Secondary | ICD-10-CM

## 2020-05-02 DIAGNOSIS — C3412 Malignant neoplasm of upper lobe, left bronchus or lung: Secondary | ICD-10-CM

## 2020-05-02 DIAGNOSIS — C799 Secondary malignant neoplasm of unspecified site: Secondary | ICD-10-CM

## 2020-05-02 DIAGNOSIS — Z5112 Encounter for antineoplastic immunotherapy: Secondary | ICD-10-CM | POA: Diagnosis not present

## 2020-05-02 LAB — CBC WITH DIFFERENTIAL/PLATELET
Abs Immature Granulocytes: 0.03 10*3/uL (ref 0.00–0.07)
Basophils Absolute: 0.1 10*3/uL (ref 0.0–0.1)
Basophils Relative: 1 %
Eosinophils Absolute: 0.4 10*3/uL (ref 0.0–0.5)
Eosinophils Relative: 5 %
HCT: 35.6 % — ABNORMAL LOW (ref 39.0–52.0)
Hemoglobin: 12.1 g/dL — ABNORMAL LOW (ref 13.0–17.0)
Immature Granulocytes: 0 %
Lymphocytes Relative: 8 %
Lymphs Abs: 0.6 10*3/uL — ABNORMAL LOW (ref 0.7–4.0)
MCH: 33.8 pg (ref 26.0–34.0)
MCHC: 34 g/dL (ref 30.0–36.0)
MCV: 99.4 fL (ref 80.0–100.0)
Monocytes Absolute: 0.7 10*3/uL (ref 0.1–1.0)
Monocytes Relative: 9 %
Neutro Abs: 6.1 10*3/uL (ref 1.7–7.7)
Neutrophils Relative %: 77 %
Platelets: 150 10*3/uL (ref 150–400)
RBC: 3.58 MIL/uL — ABNORMAL LOW (ref 4.22–5.81)
RDW: 13.9 % (ref 11.5–15.5)
WBC: 7.9 10*3/uL (ref 4.0–10.5)
nRBC: 0 % (ref 0.0–0.2)

## 2020-05-02 LAB — CMP (CANCER CENTER ONLY)
ALT: 19 U/L (ref 0–44)
AST: 21 U/L (ref 15–41)
Albumin: 3.5 g/dL (ref 3.5–5.0)
Alkaline Phosphatase: 59 U/L (ref 38–126)
Anion gap: 6 (ref 5–15)
BUN: 25 mg/dL — ABNORMAL HIGH (ref 8–23)
CO2: 29 mmol/L (ref 22–32)
Calcium: 10.2 mg/dL (ref 8.9–10.3)
Chloride: 102 mmol/L (ref 98–111)
Creatinine: 0.92 mg/dL (ref 0.61–1.24)
GFR, Est AFR Am: >60 mL/min (ref >60)
GFR, Estimated: >60 mL/min (ref >60)
Glucose, Bld: 86 mg/dL (ref 70–99)
Potassium: 4.5 mmol/L (ref 3.5–5.1)
Sodium: 137 mmol/L (ref 135–145)
Total Bilirubin: 0.5 mg/dL (ref 0.3–1.2)
Total Protein: 6.4 g/dL — ABNORMAL LOW (ref 6.5–8.1)

## 2020-05-02 MED ORDER — FAMOTIDINE IN NACL 20-0.9 MG/50ML-% IV SOLN
INTRAVENOUS | Status: AC
  Filled 2020-05-02: qty 50

## 2020-05-02 MED ORDER — DIPHENHYDRAMINE HCL 50 MG/ML IJ SOLN
25.0000 mg | Freq: Once | INTRAMUSCULAR | Status: AC
  Administered 2020-05-02: 25 mg via INTRAVENOUS

## 2020-05-02 MED ORDER — SODIUM CHLORIDE 0.9 % IV SOLN
Freq: Once | INTRAVENOUS | Status: AC
  Filled 2020-05-02: qty 250

## 2020-05-02 MED ORDER — FAMOTIDINE IN NACL 20-0.9 MG/50ML-% IV SOLN
20.0000 mg | Freq: Once | INTRAVENOUS | Status: AC
  Administered 2020-05-02: 20 mg via INTRAVENOUS

## 2020-05-02 MED ORDER — SODIUM CHLORIDE 0.9% FLUSH
10.0000 mL | INTRAVENOUS | Status: DC | PRN
  Administered 2020-05-02: 10 mL
  Filled 2020-05-02: qty 10

## 2020-05-02 MED ORDER — DIPHENHYDRAMINE HCL 50 MG/ML IJ SOLN
INTRAMUSCULAR | Status: AC
  Filled 2020-05-02: qty 1

## 2020-05-02 MED ORDER — HEPARIN SOD (PORK) LOCK FLUSH 100 UNIT/ML IV SOLN
500.0000 [IU] | Freq: Once | INTRAVENOUS | Status: AC | PRN
  Administered 2020-05-02: 500 [IU]
  Filled 2020-05-02: qty 5

## 2020-05-02 MED ORDER — SODIUM CHLORIDE 0.9 % IV SOLN
200.0000 mg | Freq: Once | INTRAVENOUS | Status: AC
  Administered 2020-05-02: 200 mg via INTRAVENOUS
  Filled 2020-05-02: qty 8

## 2020-05-02 NOTE — Patient Instructions (Signed)
[facility] Discharge Instructions for Patients Receiving Chemotherapy  Today you received the following immunotherapy agent: Pembrolizumab Beryle Flock)  To help prevent nausea and vomiting after your treatment, we encourage you to take your nausea medication as directed by your MD.   If you develop nausea and vomiting that is not controlled by your nausea medication, call the clinic.   BELOW ARE SYMPTOMS THAT SHOULD BE REPORTED IMMEDIATELY:  *FEVER GREATER THAN 100.5 F  *CHILLS WITH OR WITHOUT FEVER  NAUSEA AND VOMITING THAT IS NOT CONTROLLED WITH YOUR NAUSEA MEDICATION  *UNUSUAL SHORTNESS OF BREATH  *UNUSUAL BRUISING OR BLEEDING  TENDERNESS IN MOUTH AND THROAT WITH OR WITHOUT PRESENCE OF ULCERS  *URINARY PROBLEMS  *BOWEL PROBLEMS  UNUSUAL RASH Items with * indicate a potential emergency and should be followed up as soon as possible.  Feel free to call the clinic should you have any questions or concerns. The clinic phone number is [phone].  Please show the [facility] at check-in to the Emergency Department and triage nurse.  Denosumab injection What is this medicine? DENOSUMAB (den oh sue mab) slows bone breakdown. Prolia is used to treat osteoporosis in women after menopause and in men, and in people who are taking corticosteroids for 6 months or more. Delton See is used to treat a high calcium level due to cancer and to prevent bone fractures and other bone problems caused by multiple myeloma or cancer bone metastases. Delton See is also used to treat giant cell tumor of the bone. This medicine may be used for other purposes; ask your health care provider or pharmacist if you have questions. COMMON BRAND NAME(S): Prolia, XGEVA What should I tell my health care provider before I take this medicine? They need to know if you have any of these conditions:  dental disease  having surgery or tooth extraction  infection  kidney disease  low levels of  calcium or Vitamin D in the blood  malnutrition  on hemodialysis  skin conditions or sensitivity  thyroid or parathyroid disease  an unusual reaction to denosumab, other medicines, foods, dyes, or preservatives  pregnant or trying to get pregnant  breast-feeding How should I use this medicine? This medicine is for injection under the skin. It is given by a health care professional in a hospital or clinic setting. A special MedGuide will be given to you before each treatment. Be sure to read this information carefully each time. For Prolia, talk to your pediatrician regarding the use of this medicine in children. Special care may be needed. For Delton See, talk to your pediatrician regarding the use of this medicine in children. While this drug may be prescribed for children as young as 13 years for selected conditions, precautions do apply. Overdosage: If you think you have taken too much of this medicine contact a poison control center or emergency room at once. NOTE: This medicine is only for you. Do not share this medicine with others. What if I miss a dose? It is important not to miss your dose. Call your doctor or health care professional if you are unable to keep an appointment. What may interact with this medicine? Do not take this medicine with any of the following medications:  other medicines containing denosumab This medicine may also interact with the following medications:  medicines that lower your chance of fighting infection  steroid medicines like prednisone or cortisone This list may not describe all possible interactions. Give your health care provider a list of all  the medicines, herbs, non-prescription drugs, or dietary supplements you use. Also tell them if you smoke, drink alcohol, or use illegal drugs. Some items may interact with your medicine. What should I watch for while using this medicine? Visit your doctor or health care professional for regular checks on  your progress. Your doctor or health care professional may order blood tests and other tests to see how you are doing. Call your doctor or health care professional for advice if you get a fever, chills or sore throat, or other symptoms of a cold or flu. Do not treat yourself. This drug may decrease your body's ability to fight infection. Try to avoid being around people who are sick. You should make sure you get enough calcium and vitamin D while you are taking this medicine, unless your doctor tells you not to. Discuss the foods you eat and the vitamins you take with your health care professional. See your dentist regularly. Brush and floss your teeth as directed. Before you have any dental work done, tell your dentist you are receiving this medicine. Do not become pregnant while taking this medicine or for 5 months after stopping it. Talk with your doctor or health care professional about your birth control options while taking this medicine. Women should inform their doctor if they wish to become pregnant or think they might be pregnant. There is a potential for serious side effects to an unborn child. Talk to your health care professional or pharmacist for more information. What side effects may I notice from receiving this medicine? Side effects that you should report to your doctor or health care professional as soon as possible:  allergic reactions like skin rash, itching or hives, swelling of the face, lips, or tongue  bone pain  breathing problems  dizziness  jaw pain, especially after dental work  redness, blistering, peeling of the skin  signs and symptoms of infection like fever or chills; cough; sore throat; pain or trouble passing urine  signs of low calcium like fast heartbeat, muscle cramps or muscle pain; pain, tingling, numbness in the hands or feet; seizures  unusual bleeding or bruising  unusually weak or tired Side effects that usually do not require medical attention  (report to your doctor or health care professional if they continue or are bothersome):  constipation  diarrhea  headache  joint pain  loss of appetite  muscle pain  runny nose  tiredness  upset stomach This list may not describe all possible side effects. Call your doctor for medical advice about side effects. You may report side effects to FDA at 1-800-FDA-1088. Where should I keep my medicine? This medicine is only given in a clinic, doctor's office, or other health care setting and will not be stored at home. NOTE: This sheet is a summary. It may not cover all possible information. If you have questions about this medicine, talk to your doctor, pharmacist, or health care provider.  2020 Elsevier/Gold Standard (2018-01-01 16:10:44)

## 2020-05-08 ENCOUNTER — Ambulatory Visit (INDEPENDENT_AMBULATORY_CARE_PROVIDER_SITE_OTHER): Payer: Medicare Other | Admitting: Cardiovascular Disease

## 2020-05-08 ENCOUNTER — Encounter: Payer: Self-pay | Admitting: Cardiovascular Disease

## 2020-05-08 ENCOUNTER — Other Ambulatory Visit: Payer: Self-pay

## 2020-05-08 VITALS — BP 130/76 | HR 65 | Ht 70.0 in | Wt 123.0 lb

## 2020-05-08 DIAGNOSIS — E782 Mixed hyperlipidemia: Secondary | ICD-10-CM | POA: Diagnosis not present

## 2020-05-08 DIAGNOSIS — Z9861 Coronary angioplasty status: Secondary | ICD-10-CM | POA: Diagnosis not present

## 2020-05-08 DIAGNOSIS — E785 Hyperlipidemia, unspecified: Secondary | ICD-10-CM | POA: Diagnosis not present

## 2020-05-08 DIAGNOSIS — I10 Essential (primary) hypertension: Secondary | ICD-10-CM

## 2020-05-08 DIAGNOSIS — I2510 Atherosclerotic heart disease of native coronary artery without angina pectoris: Secondary | ICD-10-CM

## 2020-05-08 DIAGNOSIS — Z720 Tobacco use: Secondary | ICD-10-CM | POA: Diagnosis not present

## 2020-05-08 NOTE — Assessment & Plan Note (Signed)
History of CAD status post non-STEMI 06/30/2008.  His catheter has not had stent placed in his AV groove circumflex and marginal bifurcation with driver bare-metal stents.  He did have tandem 70% lesions in his RCA normal LV function.  He is remained pain-free since that time.

## 2020-05-08 NOTE — Assessment & Plan Note (Signed)
History of continued tobacco use of 5 to 10 cigarettes a day recalcitrant to risk factor modification.

## 2020-05-08 NOTE — Progress Notes (Signed)
05/08/2020 [name]   09-26-1945  [id number]  Primary Physician Brunetta Jeans, PA-C Primary Cardiologist: Lorretta Harp MD Garret Reddish, [place], [place]  HPI:  Kyle Foster is a 74 y.o.   fit-appearing married Caucasian male, father of 60 and grandfather of 3 grandchildren, whom I last sawin the office  08/04/2017.Marland KitchenHe has a history of CAD status post non-ST-segment elevation myocardial infarction June 30, 2008. They catheterized him and stented his AV-groove circumflex and marginal branch bifurcation with Driver bare-metal stents. He did have tandem 70% lesions in his RCA and normal LV function. He has been asymptomatic since. He continues to smoke 5 cigarettes a day. Patient is positive for hypertension and hyperlipidemia.  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  He has had radiation therapy, chemotherapy and now is on immunotherapy.  He does continue to smoke 5 to 10 cigarettes a day.  He denies chest pain.  He did have a 2D echocardiogram performed 03/08/2019 that revealed normal LV systolic function.  Current Meds  Medication Sig  . atorvastatin (LIPITOR) 80 MG tablet Take 1 tablet (80 mg total) by mouth daily. NEEDS APPOINTMENT FOR FUTURE REFILLS  . calcium carbonate (TUMS - DOSED IN MG ELEMENTAL CALCIUM) 500 MG chewable tablet Chew 1 tablet (200 mg of elemental calcium total) by mouth 3 (three) times daily with meals. (Patient taking differently: Chew 1 tablet by mouth 3 (three) times daily as needed for indigestion. )  . Cholecalciferol (VITAMIN D) 2000 UNITS tablet Take 2,000 Units by mouth 2 (two) times daily.  . clopidogrel (PLAVIX) 75 MG tablet Take 1 tablet (75 mg total) by mouth daily. NEEDS APPOINTMENT FOR FUTURE REFILLS  . DENTA 5000 PLUS 1.1 % CREA dental cream Place 1 application onto teeth See admin instructions.   Marland Kitchen dronabinol (MARINOL) 5 MG capsule Take 1 capsule (5 mg total) by  mouth 2 (two) times daily before lunch and supper. (Patient taking differently: Take 5 mg by mouth every evening. )  . DULoxetine (CYMBALTA) 30 MG capsule TAKE 1 CAPSULE BY MOUTH EVERY DAY  . fentaNYL (DURAGESIC) 50 MCG/HR Place 1 patch onto the skin every 3 (three) days.  . fluoruracil (CARAC) 0.5 % cream Fluorouracil 5%+ Calcipotriene 0.005%  . Hypromellose (ARTIFICIAL TEARS OP) Place 1 drop into both eyes at bedtime.   . lidocaine-prilocaine (EMLA) cream Apply 1 application topically as needed. (Patient taking differently: Apply 1 application topically as needed (access port). )  . LORazepam (ATIVAN) 0.5 MG tablet Take 1 tablet (0.5 mg total) by mouth every 6 (six) hours as needed (Nausea or vomiting).  . magnesium oxide (MAG-OX) 400 MG tablet Take 1 tablet (400 mg total) by mouth daily.  . metoprolol tartrate (LOPRESSOR) 25 MG tablet Take 1 tablet (25 mg total) by mouth 2 (two) times daily. NEEDS APPOINTMENT FOR FUTURE REFILLS  . morphine (MSIR) 15 MG tablet Take 1-2 tablets (15-30 mg total) by mouth every 4 (four) hours as needed for moderate pain or severe pain.  Marland Kitchen ondansetron (ZOFRAN) 8 MG tablet Take 1 tablet (8 mg total) by mouth every 8 (eight) hours as needed for nausea or vomiting.  . pantoprazole (PROTONIX) 40 MG tablet Take 1 tablet (40 mg total) by mouth daily.  . TRELEGY ELLIPTA 100-62.5-25 MCG/INH AEPB TAKE 1 PUFF BY MOUTH EVERY DAY (Patient taking differently: Take 1 puff by mouth daily. )  . vitamin  B-12 (CYANOCOBALAMIN) 100 MCG tablet Take 100 mcg by mouth in the morning and at bedtime.  . White Petrolatum-Mineral Oil (TEARS AGAIN) OINT Apply to eye.     Allergies  Allergen Reactions  . Codeine Other (See Comments)    HEADACHE  headaches    Social History   Socioeconomic History  . Marital status: Married    Spouse name: Not on file  . Number of children: Not on file  . Years of education: Not on file  . Highest education level: Not on file  Occupational History    . Not on file  Tobacco Use  . Smoking status: Current Every Day Smoker    Packs/day: 0.50    Years: 52.00    Pack years: 26.00    Types: Cigarettes    Start date: 24  . Smokeless tobacco: Never Used  . Tobacco comment: 03/21/19- smoking 10 cigs a day   Vaping Use  . Vaping Use: Never used  Substance and Sexual Activity  . Alcohol use: Not Currently  . Drug use: Not Currently  . Sexual activity: Not on file  Other Topics Concern  . Not on file  Social History Narrative  . Not on file   Social Determinants of Health   Financial Resource Strain:   . Difficulty of Paying Living Expenses: Not on file  Food Insecurity:   . Worried About Charity fundraiser in the Last Year: Not on file  . Ran Out of Food in the Last Year: Not on file  Transportation Needs:   . Lack of Transportation (Medical): Not on file  . Lack of Transportation (Non-Medical): Not on file  Physical Activity:   . Days of Exercise per Week: Not on file  . Minutes of Exercise per Session: Not on file  Stress:   . Feeling of Stress : Not on file  Social Connections:   . Frequency of Communication with Friends and Family: Not on file  . Frequency of Social Gatherings with Friends and Family: Not on file  . Attends Religious Services: Not on file  . Active Member of Clubs or Organizations: Not on file  . Attends Archivist Meetings: Not on file  . Marital Status: Not on file  Intimate Partner Violence:   . Fear of Current or Ex-Partner: Not on file  . Emotionally Abused: Not on file  . Physically Abused: Not on file  . Sexually Abused: Not on file     Review of Systems: General: negative for chills, fever, night sweats or weight changes.  Cardiovascular: negative for chest pain, dyspnea on exertion, edema, orthopnea, palpitations, paroxysmal nocturnal dyspnea or shortness of breath Dermatological: negative for rash Respiratory: negative for cough or wheezing Urologic: negative for  hematuria Abdominal: negative for nausea, vomiting, diarrhea, bright red blood per rectum, melena, or hematemesis Neurologic: negative for visual changes, syncope, or dizziness All other systems reviewed and are otherwise negative except as noted above.    Blood pressure 130/76, pulse 65, height 5\' 10"  (1.778 m), weight 123 lb (55.8 kg), SpO2 98 %.  General appearance: alert and no distress Neck: no adenopathy, no carotid bruit, no JVD, supple, symmetrical, trachea midline and thyroid not enlarged, symmetric, no tenderness/mass/nodules Lungs: clear to auscultation bilaterally Heart: regular rate and rhythm, S1, S2 normal, no murmur, click, rub or gallop Extremities: extremities normal, atraumatic, no cyanosis or edema Pulses: 2+ and symmetric Skin: Skin color, texture, turgor normal. No rashes or lesions Neurologic: Alert and oriented X  3, normal strength and tone. Normal symmetric reflexes. Normal coordination and gait  EKG not performed today  ASSESSMENT AND PLAN:   CAD S/P percutaneous coronary angioplasty History of CAD status post non-STEMI 06/30/2008.  His catheter has not had stent placed in his AV groove circumflex and marginal bifurcation with driver bare-metal stents.  He did have tandem 70% lesions in his RCA normal LV function.  He is remained pain-free since that time.  Essential hypertension History of essential hypertension blood pressure measured today 130/76.  He is on metoprolol.  Mixed hyperlipidemia History of hyperlipidemia on atorvastatin.  We will recheck a lipid liver profile today.  Tobacco abuse History of continued tobacco use of 5 to 10 cigarettes a day recalcitrant to risk factor modification.      Lorretta Harp MD FACP,FACC,FAHA, [facility] 05/08/2020 4:17 PM

## 2020-05-08 NOTE — Assessment & Plan Note (Signed)
History of hyperlipidemia on atorvastatin.  We will recheck a lipid liver profile today.

## 2020-05-08 NOTE — Patient Instructions (Signed)
Medication Instructions:  The current medical regimen is effective;  continue present plan and medications.  *If you need a refill on your cardiac medications before your next appointment, please call your pharmacy*   Lab Work: LIPID/LIVER today  If you have labs (blood work) drawn today and your tests are completely normal, you will receive your results only by: Marland Kitchen MyChart Message (if you have MyChart) OR . A paper copy in the mail If you have any lab test that is abnormal or we need to change your treatment, we will call you to review the results.   Follow-Up: At [facility], you and your health needs are our priority.  As part of our continuing mission to provide you with exceptional heart care, we have created designated Provider Care Teams.  These Care Teams include your primary Cardiologist (physician) and Advanced Practice Providers (APPs -  Physician Assistants and Nurse Practitioners) who all work together to provide you with the care you need, when you need it.  We recommend signing up for the patient portal called "MyChart".  Sign up information is provided on this After Visit Summary.  MyChart is used to connect with patients for Virtual Visits (Telemedicine).  Patients are able to view lab/test results, encounter notes, upcoming appointments, etc.  Non-urgent messages can be sent to your provider as well.   To learn more about what you can do with MyChart, go to [email or web address].    Your next appointment:   12 month(s)  The format for your next appointment:   In Person  Provider:   Quay Burow, MD

## 2020-05-08 NOTE — Assessment & Plan Note (Signed)
History of essential hypertension blood pressure measured today 130/76.  He is on metoprolol.

## 2020-05-09 LAB — LIPID PANEL
Chol/HDL Ratio: 2.2 ratio (ref 0.0–5.0)
Cholesterol, Total: 150 mg/dL (ref 100–199)
HDL: 67 mg/dL (ref >39)
LDL Chol Calc (NIH): 73 mg/dL (ref 0–99)
Triglycerides: 45 mg/dL (ref 0–149)
VLDL Cholesterol Cal: 10 mg/dL (ref 5–40)

## 2020-05-09 LAB — HEPATIC FUNCTION PANEL
ALT: 18 IU/L (ref 0–44)
AST: 24 IU/L (ref 0–40)
Albumin: 4.4 g/dL (ref 3.7–4.7)
Alkaline Phosphatase: 65 IU/L (ref 48–121)
Bilirubin Total: 0.5 mg/dL (ref 0.0–1.2)
Bilirubin, Direct: 0.15 mg/dL (ref 0.00–0.40)
Total Protein: 6.6 g/dL (ref 6.0–8.5)

## 2020-05-10 ENCOUNTER — Other Ambulatory Visit: Payer: Self-pay | Admitting: *Deleted

## 2020-05-10 DIAGNOSIS — C3492 Malignant neoplasm of unspecified part of left bronchus or lung: Secondary | ICD-10-CM

## 2020-05-10 MED ORDER — FENTANYL 50 MCG/HR TD PT72: 1.0000 | MEDICATED_PATCH | TRANSDERMAL | 0 refills | Status: DC

## 2020-05-10 MED ORDER — DRONABINOL 5 MG PO CAPS: 5.0000 mg | ORAL_CAPSULE | Freq: Two times a day (BID) | ORAL | 0 refills | Status: DC

## 2020-05-10 MED ORDER — MORPHINE SULFATE 15 MG PO TABS: 15.0000 mg | ORAL_TABLET | ORAL | 0 refills | Status: DC | PRN

## 2020-05-10 MED ORDER — ONDANSETRON HCL 8 MG PO TABS: 8.0000 mg | ORAL_TABLET | Freq: Three times a day (TID) | ORAL | 1 refills | Status: DC | PRN

## 2020-05-10 NOTE — Telephone Encounter (Signed)
Wife called, requested refill of Zofran

## 2020-05-10 NOTE — Telephone Encounter (Signed)
Patient's wife called- requested refills on his behalf: Marinol, Fentanyl, Morphine
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
## 2020-05-24 ENCOUNTER — Other Ambulatory Visit: Payer: Self-pay

## 2020-05-24 ENCOUNTER — Inpatient Hospital Stay: Payer: No Typology Code available for payment source

## 2020-05-24 ENCOUNTER — Inpatient Hospital Stay: Payer: No Typology Code available for payment source | Attending: Hematology

## 2020-05-24 ENCOUNTER — Inpatient Hospital Stay (HOSPITAL_BASED_OUTPATIENT_CLINIC_OR_DEPARTMENT_OTHER): Payer: No Typology Code available for payment source | Admitting: Hematology

## 2020-05-24 VITALS — BP 121/57 | HR 66 | Temp 96.6°F | Resp 18 | Ht 70.0 in | Wt 121.5 lb

## 2020-05-24 DIAGNOSIS — C3412 Malignant neoplasm of upper lobe, left bronchus or lung: Secondary | ICD-10-CM

## 2020-05-24 DIAGNOSIS — C7951 Secondary malignant neoplasm of bone: Secondary | ICD-10-CM

## 2020-05-24 DIAGNOSIS — I10 Essential (primary) hypertension: Secondary | ICD-10-CM | POA: Insufficient documentation

## 2020-05-24 DIAGNOSIS — C3492 Malignant neoplasm of unspecified part of left bronchus or lung: Secondary | ICD-10-CM | POA: Diagnosis not present

## 2020-05-24 DIAGNOSIS — I252 Old myocardial infarction: Secondary | ICD-10-CM | POA: Insufficient documentation

## 2020-05-24 DIAGNOSIS — Z5112 Encounter for antineoplastic immunotherapy: Secondary | ICD-10-CM | POA: Insufficient documentation

## 2020-05-24 DIAGNOSIS — Z79899 Other long term (current) drug therapy: Secondary | ICD-10-CM | POA: Diagnosis not present

## 2020-05-24 DIAGNOSIS — Z8551 Personal history of malignant neoplasm of bladder: Secondary | ICD-10-CM | POA: Diagnosis not present

## 2020-05-24 DIAGNOSIS — Z8249 Family history of ischemic heart disease and other diseases of the circulatory system: Secondary | ICD-10-CM | POA: Diagnosis not present

## 2020-05-24 DIAGNOSIS — F17210 Nicotine dependence, cigarettes, uncomplicated: Secondary | ICD-10-CM | POA: Insufficient documentation

## 2020-05-24 DIAGNOSIS — Z7189 Other specified counseling: Secondary | ICD-10-CM

## 2020-05-24 DIAGNOSIS — Z23 Encounter for immunization: Secondary | ICD-10-CM | POA: Insufficient documentation

## 2020-05-24 DIAGNOSIS — Z801 Family history of malignant neoplasm of trachea, bronchus and lung: Secondary | ICD-10-CM | POA: Diagnosis not present

## 2020-05-24 DIAGNOSIS — F329 Major depressive disorder, single episode, unspecified: Secondary | ICD-10-CM | POA: Diagnosis not present

## 2020-05-24 LAB — CMP (CANCER CENTER ONLY)
ALT: 16 U/L (ref 0–44)
AST: 22 U/L (ref 15–41)
Albumin: 3.7 g/dL (ref 3.5–5.0)
Alkaline Phosphatase: 55 U/L (ref 38–126)
Anion gap: 8 (ref 5–15)
BUN: 27 mg/dL — ABNORMAL HIGH (ref 8–23)
CO2: 26 mmol/L (ref 22–32)
Calcium: 9.4 mg/dL (ref 8.9–10.3)
Chloride: 106 mmol/L (ref 98–111)
Creatinine: 0.98 mg/dL (ref 0.61–1.24)
GFR, Est AFR Am: >60 mL/min (ref >60)
GFR, Estimated: >60 mL/min (ref >60)
Glucose, Bld: 95 mg/dL (ref 70–99)
Potassium: 4.2 mmol/L (ref 3.5–5.1)
Sodium: 140 mmol/L (ref 135–145)
Total Bilirubin: 0.5 mg/dL (ref 0.3–1.2)
Total Protein: 6.8 g/dL (ref 6.5–8.1)

## 2020-05-24 LAB — CBC WITH DIFFERENTIAL/PLATELET
Abs Immature Granulocytes: 0.02 10*3/uL (ref 0.00–0.07)
Basophils Absolute: 0 10*3/uL (ref 0.0–0.1)
Basophils Relative: 0 %
Eosinophils Absolute: 0.6 10*3/uL — ABNORMAL HIGH (ref 0.0–0.5)
Eosinophils Relative: 6 %
HCT: 37.8 % — ABNORMAL LOW (ref 39.0–52.0)
Hemoglobin: 12.4 g/dL — ABNORMAL LOW (ref 13.0–17.0)
Immature Granulocytes: 0 %
Lymphocytes Relative: 7 %
Lymphs Abs: 0.6 10*3/uL — ABNORMAL LOW (ref 0.7–4.0)
MCH: 33.2 pg (ref 26.0–34.0)
MCHC: 32.8 g/dL (ref 30.0–36.0)
MCV: 101.3 fL — ABNORMAL HIGH (ref 80.0–100.0)
Monocytes Absolute: 0.8 10*3/uL (ref 0.1–1.0)
Monocytes Relative: 9 %
Neutro Abs: 7 10*3/uL (ref 1.7–7.7)
Neutrophils Relative %: 78 %
Platelets: 146 10*3/uL — ABNORMAL LOW (ref 150–400)
RBC: 3.73 MIL/uL — ABNORMAL LOW (ref 4.22–5.81)
RDW: 13.4 % (ref 11.5–15.5)
WBC: 9 10*3/uL (ref 4.0–10.5)
nRBC: 0 % (ref 0.0–0.2)

## 2020-05-24 MED ORDER — DIPHENHYDRAMINE HCL 50 MG/ML IJ SOLN
INTRAMUSCULAR | Status: AC
  Filled 2020-05-24: qty 1

## 2020-05-24 MED ORDER — PNEUMOCOCCAL VAC POLYVALENT 25 MCG/0.5ML IJ INJ
0.5000 mL | INJECTION | Freq: Once | INTRAMUSCULAR | Status: AC
  Administered 2020-05-24: 0.5 mL via INTRAMUSCULAR
  Filled 2020-05-24: qty 0.5

## 2020-05-24 MED ORDER — SODIUM CHLORIDE 0.9 % IV SOLN
200.0000 mg | Freq: Once | INTRAVENOUS | Status: AC
  Administered 2020-05-24: 200 mg via INTRAVENOUS
  Filled 2020-05-24: qty 8

## 2020-05-24 MED ORDER — DIPHENHYDRAMINE HCL 50 MG/ML IJ SOLN
25.0000 mg | Freq: Once | INTRAMUSCULAR | Status: AC
  Administered 2020-05-24: 25 mg via INTRAVENOUS

## 2020-05-24 MED ORDER — FAMOTIDINE IN NACL 20-0.9 MG/50ML-% IV SOLN
20.0000 mg | Freq: Once | INTRAVENOUS | Status: AC
  Administered 2020-05-24: 20 mg via INTRAVENOUS

## 2020-05-24 MED ORDER — INFLUENZA VAC A&B SA ADJ QUAD 0.5 ML IM PRSY
PREFILLED_SYRINGE | INTRAMUSCULAR | Status: AC
  Filled 2020-05-24: qty 0.5

## 2020-05-24 MED ORDER — DENOSUMAB 120 MG/1.7ML ~~LOC~~ SOLN
SUBCUTANEOUS | Status: AC
  Filled 2020-05-24: qty 1.7

## 2020-05-24 MED ORDER — FAMOTIDINE IN NACL 20-0.9 MG/50ML-% IV SOLN
INTRAVENOUS | Status: AC
  Filled 2020-05-24: qty 50

## 2020-05-24 MED ORDER — INFLUENZA VAC A&B SA ADJ QUAD 0.5 ML IM PRSY
0.5000 mL | PREFILLED_SYRINGE | Freq: Once | INTRAMUSCULAR | Status: AC
  Administered 2020-05-24: 0.5 mL via INTRAMUSCULAR

## 2020-05-24 MED ORDER — SODIUM CHLORIDE 0.9% FLUSH
10.0000 mL | INTRAVENOUS | Status: DC | PRN
  Administered 2020-05-24: 10 mL
  Filled 2020-05-24: qty 10

## 2020-05-24 MED ORDER — SODIUM CHLORIDE 0.9 % IV SOLN
Freq: Once | INTRAVENOUS | Status: AC
  Filled 2020-05-24: qty 250

## 2020-05-24 MED ORDER — DENOSUMAB 120 MG/1.7ML ~~LOC~~ SOLN
120.0000 mg | Freq: Once | SUBCUTANEOUS | Status: AC
  Administered 2020-05-24: 120 mg via SUBCUTANEOUS

## 2020-05-24 MED ORDER — HEPARIN SOD (PORK) LOCK FLUSH 100 UNIT/ML IV SOLN
500.0000 [IU] | Freq: Once | INTRAVENOUS | Status: AC | PRN
  Administered 2020-05-24: 500 [IU]
  Filled 2020-05-24: qty 5

## 2020-05-24 NOTE — Patient Instructions (Signed)
[facility] Discharge Instructions for Patients Receiving Chemotherapy  Today you received the following chemotherapy agents:  Keytruda.  To help prevent nausea and vomiting after your treatment, we encourage you to take your nausea medication as directed.   If you develop nausea and vomiting that is not controlled by your nausea medication, call the clinic.   BELOW ARE SYMPTOMS THAT SHOULD BE REPORTED IMMEDIATELY:  *FEVER GREATER THAN 100.5 F  *CHILLS WITH OR WITHOUT FEVER  NAUSEA AND VOMITING THAT IS NOT CONTROLLED WITH YOUR NAUSEA MEDICATION  *UNUSUAL SHORTNESS OF BREATH  *UNUSUAL BRUISING OR BLEEDING  TENDERNESS IN MOUTH AND THROAT WITH OR WITHOUT PRESENCE OF ULCERS  *URINARY PROBLEMS  *BOWEL PROBLEMS  UNUSUAL RASH Items with * indicate a potential emergency and should be followed up as soon as possible.  Feel free to call the clinic should you have any questions or concerns. The clinic phone number is [phone].  Please show the CHEMO ALERT CARD at check-in to the Emergency Department and triage nurse.    

## 2020-06-10 ENCOUNTER — Other Ambulatory Visit: Payer: Self-pay | Admitting: Cardiovascular Disease

## 2020-06-11 ENCOUNTER — Other Ambulatory Visit: Payer: Self-pay | Admitting: *Deleted

## 2020-06-11 DIAGNOSIS — C3492 Malignant neoplasm of unspecified part of left bronchus or lung: Secondary | ICD-10-CM

## 2020-06-11 MED ORDER — FENTANYL 50 MCG/HR TD PT72: 1.0000 | MEDICATED_PATCH | TRANSDERMAL | 0 refills | Status: DC

## 2020-06-11 MED ORDER — ONDANSETRON HCL 8 MG PO TABS: 8.0000 mg | ORAL_TABLET | Freq: Three times a day (TID) | ORAL | 1 refills | Status: DC | PRN

## 2020-06-11 MED ORDER — MORPHINE SULFATE 15 MG PO TABS: 15.0000 mg | ORAL_TABLET | ORAL | 0 refills | Status: DC | PRN

## 2020-06-11 NOTE — Telephone Encounter (Signed)
Zofran refill requested

## 2020-06-11 NOTE — Telephone Encounter (Signed)
Called - requested refill of Fentanyl patch and Morphine

## 2020-06-12 ENCOUNTER — Other Ambulatory Visit: Payer: Self-pay | Admitting: Hematology

## 2020-06-13 ENCOUNTER — Other Ambulatory Visit: Payer: Self-pay

## 2020-06-13 ENCOUNTER — Inpatient Hospital Stay: Payer: No Typology Code available for payment source

## 2020-06-13 ENCOUNTER — Inpatient Hospital Stay: Payer: No Typology Code available for payment source | Attending: Hematology

## 2020-06-13 VITALS — BP 121/58 | HR 54 | Temp 97.4°F | Resp 16 | Ht 70.0 in | Wt 122.2 lb

## 2020-06-13 DIAGNOSIS — Z809 Family history of malignant neoplasm, unspecified: Secondary | ICD-10-CM | POA: Diagnosis not present

## 2020-06-13 DIAGNOSIS — Z79899 Other long term (current) drug therapy: Secondary | ICD-10-CM | POA: Insufficient documentation

## 2020-06-13 DIAGNOSIS — I10 Essential (primary) hypertension: Secondary | ICD-10-CM | POA: Insufficient documentation

## 2020-06-13 DIAGNOSIS — C3492 Malignant neoplasm of unspecified part of left bronchus or lung: Secondary | ICD-10-CM

## 2020-06-13 DIAGNOSIS — Z7189 Other specified counseling: Secondary | ICD-10-CM

## 2020-06-13 DIAGNOSIS — C7951 Secondary malignant neoplasm of bone: Secondary | ICD-10-CM | POA: Insufficient documentation

## 2020-06-13 DIAGNOSIS — Z801 Family history of malignant neoplasm of trachea, bronchus and lung: Secondary | ICD-10-CM | POA: Diagnosis not present

## 2020-06-13 DIAGNOSIS — Z5112 Encounter for antineoplastic immunotherapy: Secondary | ICD-10-CM | POA: Insufficient documentation

## 2020-06-13 DIAGNOSIS — F17210 Nicotine dependence, cigarettes, uncomplicated: Secondary | ICD-10-CM | POA: Diagnosis not present

## 2020-06-13 DIAGNOSIS — Z8249 Family history of ischemic heart disease and other diseases of the circulatory system: Secondary | ICD-10-CM | POA: Diagnosis not present

## 2020-06-13 DIAGNOSIS — Z8551 Personal history of malignant neoplasm of bladder: Secondary | ICD-10-CM | POA: Insufficient documentation

## 2020-06-13 DIAGNOSIS — G893 Neoplasm related pain (acute) (chronic): Secondary | ICD-10-CM | POA: Insufficient documentation

## 2020-06-13 DIAGNOSIS — Z833 Family history of diabetes mellitus: Secondary | ICD-10-CM | POA: Diagnosis not present

## 2020-06-13 DIAGNOSIS — C3412 Malignant neoplasm of upper lobe, left bronchus or lung: Secondary | ICD-10-CM | POA: Diagnosis present

## 2020-06-13 LAB — CMP (CANCER CENTER ONLY)
ALT: 19 U/L (ref 0–44)
AST: 23 U/L (ref 15–41)
Albumin: 3.5 g/dL (ref 3.5–5.0)
Alkaline Phosphatase: 54 U/L (ref 38–126)
Anion gap: 2 — ABNORMAL LOW (ref 5–15)
BUN: 22 mg/dL (ref 8–23)
CO2: 29 mmol/L (ref 22–32)
Calcium: 9.1 mg/dL (ref 8.9–10.3)
Chloride: 105 mmol/L (ref 98–111)
Creatinine: 0.79 mg/dL (ref 0.61–1.24)
GFR, Estimated: >60 mL/min (ref >60)
Glucose, Bld: 83 mg/dL (ref 70–99)
Potassium: 4.4 mmol/L (ref 3.5–5.1)
Sodium: 136 mmol/L (ref 135–145)
Total Bilirubin: 0.5 mg/dL (ref 0.3–1.2)
Total Protein: 6.4 g/dL — ABNORMAL LOW (ref 6.5–8.1)

## 2020-06-13 LAB — CBC WITH DIFFERENTIAL/PLATELET
Abs Immature Granulocytes: 0.03 10*3/uL (ref 0.00–0.07)
Basophils Absolute: 0.1 10*3/uL (ref 0.0–0.1)
Basophils Relative: 1 %
Eosinophils Absolute: 0.5 10*3/uL (ref 0.0–0.5)
Eosinophils Relative: 7 %
HCT: 36.8 % — ABNORMAL LOW (ref 39.0–52.0)
Hemoglobin: 12.2 g/dL — ABNORMAL LOW (ref 13.0–17.0)
Immature Granulocytes: 0 %
Lymphocytes Relative: 7 %
Lymphs Abs: 0.5 10*3/uL — ABNORMAL LOW (ref 0.7–4.0)
MCH: 32.7 pg (ref 26.0–34.0)
MCHC: 33.2 g/dL (ref 30.0–36.0)
MCV: 98.7 fL (ref 80.0–100.0)
Monocytes Absolute: 0.7 10*3/uL (ref 0.1–1.0)
Monocytes Relative: 9 %
Neutro Abs: 6.1 10*3/uL (ref 1.7–7.7)
Neutrophils Relative %: 76 %
Platelets: 142 10*3/uL — ABNORMAL LOW (ref 150–400)
RBC: 3.73 MIL/uL — ABNORMAL LOW (ref 4.22–5.81)
RDW: 13.1 % (ref 11.5–15.5)
WBC: 8 10*3/uL (ref 4.0–10.5)
nRBC: 0 % (ref 0.0–0.2)

## 2020-06-13 MED ORDER — SODIUM CHLORIDE 0.9% FLUSH
10.0000 mL | INTRAVENOUS | Status: DC | PRN
  Administered 2020-06-13: 10 mL
  Filled 2020-06-13: qty 10

## 2020-06-13 MED ORDER — SODIUM CHLORIDE 0.9 % IV SOLN
Freq: Once | INTRAVENOUS | Status: AC
  Filled 2020-06-13: qty 250

## 2020-06-13 MED ORDER — FAMOTIDINE IN NACL 20-0.9 MG/50ML-% IV SOLN
20.0000 mg | Freq: Once | INTRAVENOUS | Status: AC
  Administered 2020-06-13: 20 mg via INTRAVENOUS

## 2020-06-13 MED ORDER — HEPARIN SOD (PORK) LOCK FLUSH 100 UNIT/ML IV SOLN
500.0000 [IU] | Freq: Once | INTRAVENOUS | Status: AC | PRN
  Administered 2020-06-13: 500 [IU]
  Filled 2020-06-13: qty 5

## 2020-06-13 MED ORDER — FAMOTIDINE IN NACL 20-0.9 MG/50ML-% IV SOLN
INTRAVENOUS | Status: AC
  Filled 2020-06-13: qty 50

## 2020-06-13 MED ORDER — SODIUM CHLORIDE 0.9 % IV SOLN
200.0000 mg | Freq: Once | INTRAVENOUS | Status: AC
  Administered 2020-06-13: 200 mg via INTRAVENOUS
  Filled 2020-06-13: qty 8

## 2020-06-13 MED ORDER — DIPHENHYDRAMINE HCL 50 MG/ML IJ SOLN
INTRAMUSCULAR | Status: AC
  Filled 2020-06-13: qty 1

## 2020-06-13 MED ORDER — DIPHENHYDRAMINE HCL 50 MG/ML IJ SOLN
25.0000 mg | Freq: Once | INTRAMUSCULAR | Status: AC
  Administered 2020-06-13: 25 mg via INTRAVENOUS

## 2020-06-13 NOTE — Patient Instructions (Signed)
Cancer Center Discharge Instructions for Patients Receiving Chemotherapy  Today you received the following chemotherapy agents :  Keytruda.  To help prevent nausea and vomiting after your treatment, we encourage you to take your nausea medication as prescribed.   If you develop nausea and vomiting that is not controlled by your nausea medication, call the clinic.   BELOW ARE SYMPTOMS THAT SHOULD BE REPORTED IMMEDIATELY:  *FEVER GREATER THAN 100.5 F  *CHILLS WITH OR WITHOUT FEVER  NAUSEA AND VOMITING THAT IS NOT CONTROLLED WITH YOUR NAUSEA MEDICATION  *UNUSUAL SHORTNESS OF BREATH  *UNUSUAL BRUISING OR BLEEDING  TENDERNESS IN MOUTH AND THROAT WITH OR WITHOUT PRESENCE OF ULCERS  *URINARY PROBLEMS  *BOWEL PROBLEMS  UNUSUAL RASH Items with * indicate a potential emergency and should be followed up as soon as possible.  Feel free to call the clinic should you have any questions or concerns. The clinic phone number is [phone].  Please show the CHEMO ALERT CARD at check-in to the Emergency Department and triage nurse.  

## 2020-06-13 NOTE — Patient Instructions (Signed)
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
## 2020-07-03 ENCOUNTER — Other Ambulatory Visit: Payer: Self-pay | Admitting: Hematology

## 2020-07-04 ENCOUNTER — Inpatient Hospital Stay: Payer: No Typology Code available for payment source

## 2020-07-04 ENCOUNTER — Other Ambulatory Visit: Payer: Self-pay

## 2020-07-04 ENCOUNTER — Inpatient Hospital Stay (HOSPITAL_BASED_OUTPATIENT_CLINIC_OR_DEPARTMENT_OTHER): Payer: No Typology Code available for payment source | Admitting: Hematology

## 2020-07-04 VITALS — BP 107/66 | HR 70 | Temp 96.9°F | Resp 18 | Ht 70.0 in | Wt 123.4 lb

## 2020-07-04 DIAGNOSIS — C3412 Malignant neoplasm of upper lobe, left bronchus or lung: Secondary | ICD-10-CM

## 2020-07-04 DIAGNOSIS — C7951 Secondary malignant neoplasm of bone: Secondary | ICD-10-CM | POA: Diagnosis not present

## 2020-07-04 DIAGNOSIS — C3492 Malignant neoplasm of unspecified part of left bronchus or lung: Secondary | ICD-10-CM

## 2020-07-04 DIAGNOSIS — Z7189 Other specified counseling: Secondary | ICD-10-CM

## 2020-07-04 DIAGNOSIS — Z5112 Encounter for antineoplastic immunotherapy: Secondary | ICD-10-CM

## 2020-07-04 LAB — CMP (CANCER CENTER ONLY)
ALT: 16 U/L (ref 0–44)
AST: 21 U/L (ref 15–41)
Albumin: 3.7 g/dL (ref 3.5–5.0)
Alkaline Phosphatase: 62 U/L (ref 38–126)
Anion gap: 8 (ref 5–15)
BUN: 26 mg/dL — ABNORMAL HIGH (ref 8–23)
CO2: 27 mmol/L (ref 22–32)
Calcium: 9.3 mg/dL (ref 8.9–10.3)
Chloride: 104 mmol/L (ref 98–111)
Creatinine: 0.89 mg/dL (ref 0.61–1.24)
GFR, Estimated: >60 mL/min (ref >60)
Glucose, Bld: 84 mg/dL (ref 70–99)
Potassium: 4.3 mmol/L (ref 3.5–5.1)
Sodium: 139 mmol/L (ref 135–145)
Total Bilirubin: 0.6 mg/dL (ref 0.3–1.2)
Total Protein: 6.6 g/dL (ref 6.5–8.1)

## 2020-07-04 LAB — CBC WITH DIFFERENTIAL/PLATELET
Abs Immature Granulocytes: 0.03 10*3/uL (ref 0.00–0.07)
Basophils Absolute: 0.1 10*3/uL (ref 0.0–0.1)
Basophils Relative: 1 %
Eosinophils Absolute: 0.6 10*3/uL — ABNORMAL HIGH (ref 0.0–0.5)
Eosinophils Relative: 6 %
HCT: 37.9 % — ABNORMAL LOW (ref 39.0–52.0)
Hemoglobin: 12.6 g/dL — ABNORMAL LOW (ref 13.0–17.0)
Immature Granulocytes: 0 %
Lymphocytes Relative: 7 %
Lymphs Abs: 0.6 10*3/uL — ABNORMAL LOW (ref 0.7–4.0)
MCH: 32.6 pg (ref 26.0–34.0)
MCHC: 33.2 g/dL (ref 30.0–36.0)
MCV: 97.9 fL (ref 80.0–100.0)
Monocytes Absolute: 0.8 10*3/uL (ref 0.1–1.0)
Monocytes Relative: 9 %
Neutro Abs: 7.1 10*3/uL (ref 1.7–7.7)
Neutrophils Relative %: 77 %
Platelets: 159 10*3/uL (ref 150–400)
RBC: 3.87 MIL/uL — ABNORMAL LOW (ref 4.22–5.81)
RDW: 12.7 % (ref 11.5–15.5)
WBC: 9.1 10*3/uL (ref 4.0–10.5)
nRBC: 0 % (ref 0.0–0.2)

## 2020-07-04 MED ORDER — FAMOTIDINE IN NACL 20-0.9 MG/50ML-% IV SOLN
INTRAVENOUS | Status: AC
  Filled 2020-07-04: qty 50

## 2020-07-04 MED ORDER — DENOSUMAB 120 MG/1.7ML ~~LOC~~ SOLN
120.0000 mg | Freq: Once | SUBCUTANEOUS | Status: AC
  Administered 2020-07-04: 120 mg via SUBCUTANEOUS

## 2020-07-04 MED ORDER — SODIUM CHLORIDE 0.9 % IV SOLN
Freq: Once | INTRAVENOUS | Status: AC
  Filled 2020-07-04: qty 250

## 2020-07-04 MED ORDER — SODIUM CHLORIDE 0.9% FLUSH
10.0000 mL | INTRAVENOUS | Status: DC | PRN
  Administered 2020-07-04: 10 mL
  Filled 2020-07-04: qty 10

## 2020-07-04 MED ORDER — DIPHENHYDRAMINE HCL 50 MG/ML IJ SOLN
INTRAMUSCULAR | Status: AC
  Filled 2020-07-04: qty 1

## 2020-07-04 MED ORDER — DIPHENHYDRAMINE HCL 50 MG/ML IJ SOLN
25.0000 mg | Freq: Once | INTRAMUSCULAR | Status: AC
  Administered 2020-07-04: 25 mg via INTRAVENOUS

## 2020-07-04 MED ORDER — SODIUM CHLORIDE 0.9 % IV SOLN
200.0000 mg | Freq: Once | INTRAVENOUS | Status: AC
  Administered 2020-07-04: 200 mg via INTRAVENOUS
  Filled 2020-07-04: qty 8

## 2020-07-04 MED ORDER — DENOSUMAB 120 MG/1.7ML ~~LOC~~ SOLN
SUBCUTANEOUS | Status: AC
  Filled 2020-07-04: qty 1.7

## 2020-07-04 MED ORDER — HEPARIN SOD (PORK) LOCK FLUSH 100 UNIT/ML IV SOLN
500.0000 [IU] | Freq: Once | INTRAVENOUS | Status: AC | PRN
  Administered 2020-07-04: 500 [IU]
  Filled 2020-07-04: qty 5

## 2020-07-04 MED ORDER — FAMOTIDINE IN NACL 20-0.9 MG/50ML-% IV SOLN
20.0000 mg | Freq: Once | INTRAVENOUS | Status: AC
  Administered 2020-07-04: 20 mg via INTRAVENOUS

## 2020-07-04 NOTE — Patient Instructions (Signed)

## 2020-07-04 NOTE — Patient Instructions (Signed)
[facility] Discharge Instructions for Patients Receiving Chemotherapy  Today you received the following chemotherapy agents:  Keytruda.  To help prevent nausea and vomiting after your treatment, we encourage you to take your nausea medication as directed.   If you develop nausea and vomiting that is not controlled by your nausea medication, call the clinic.   BELOW ARE SYMPTOMS THAT SHOULD BE REPORTED IMMEDIATELY:  *FEVER GREATER THAN 100.5 F  *CHILLS WITH OR WITHOUT FEVER  NAUSEA AND VOMITING THAT IS NOT CONTROLLED WITH YOUR NAUSEA MEDICATION  *UNUSUAL SHORTNESS OF BREATH  *UNUSUAL BRUISING OR BLEEDING  TENDERNESS IN MOUTH AND THROAT WITH OR WITHOUT PRESENCE OF ULCERS  *URINARY PROBLEMS  *BOWEL PROBLEMS  UNUSUAL RASH Items with * indicate a potential emergency and should be followed up as soon as possible.  Feel free to call the clinic should you have any questions or concerns. The clinic phone number is [phone].  Please show the CHEMO ALERT CARD at check-in to the Emergency Department and triage nurse.    

## 2020-07-10 ENCOUNTER — Other Ambulatory Visit: Payer: Self-pay | Admitting: *Deleted

## 2020-07-10 DIAGNOSIS — C3492 Malignant neoplasm of unspecified part of left bronchus or lung: Secondary | ICD-10-CM

## 2020-07-10 MED ORDER — MAGNESIUM OXIDE 400 MG PO TABS: 1.0000 | ORAL_TABLET | Freq: Every day | ORAL | 0 refills | Status: AC

## 2020-07-10 MED ORDER — ONDANSETRON HCL 8 MG PO TABS: 8.0000 mg | ORAL_TABLET | Freq: Three times a day (TID) | ORAL | 2 refills | Status: DC | PRN

## 2020-07-10 MED ORDER — MORPHINE SULFATE 15 MG PO TABS: 15.0000 mg | ORAL_TABLET | ORAL | 0 refills | Status: DC | PRN

## 2020-07-10 MED ORDER — FENTANYL 50 MCG/HR TD PT72: 1.0000 | MEDICATED_PATCH | TRANSDERMAL | 0 refills | Status: DC

## 2020-07-10 NOTE — Telephone Encounter (Signed)
Wife called - needs refill of Fentanyl patch and Morphine

## 2020-07-10 NOTE — Telephone Encounter (Signed)
Wife called - needs refill of magnesium and zofran

## 2020-07-20 ENCOUNTER — Telehealth: Payer: Self-pay | Admitting: Hematology

## 2020-07-20 ENCOUNTER — Other Ambulatory Visit: Payer: Self-pay | Admitting: Pulmonary Disease

## 2020-07-20 NOTE — Telephone Encounter (Signed)
Scheduled per los, spoke with patient's spouse. Patient will be notified of upcoming appointment.

## 2020-07-24 ENCOUNTER — Other Ambulatory Visit: Payer: Self-pay | Admitting: Hematology

## 2020-07-25 ENCOUNTER — Inpatient Hospital Stay: Payer: Medicare Other | Attending: Hematology

## 2020-07-25 ENCOUNTER — Inpatient Hospital Stay: Payer: Medicare Other

## 2020-07-25 ENCOUNTER — Other Ambulatory Visit: Payer: Self-pay

## 2020-07-25 VITALS — BP 122/67 | HR 54 | Temp 98.1°F | Resp 17 | Wt 122.0 lb

## 2020-07-25 DIAGNOSIS — Z7189 Other specified counseling: Secondary | ICD-10-CM

## 2020-07-25 DIAGNOSIS — C3492 Malignant neoplasm of unspecified part of left bronchus or lung: Secondary | ICD-10-CM

## 2020-07-25 DIAGNOSIS — F17210 Nicotine dependence, cigarettes, uncomplicated: Secondary | ICD-10-CM | POA: Diagnosis not present

## 2020-07-25 DIAGNOSIS — C7951 Secondary malignant neoplasm of bone: Secondary | ICD-10-CM

## 2020-07-25 DIAGNOSIS — Z5112 Encounter for antineoplastic immunotherapy: Secondary | ICD-10-CM | POA: Insufficient documentation

## 2020-07-25 DIAGNOSIS — G893 Neoplasm related pain (acute) (chronic): Secondary | ICD-10-CM | POA: Insufficient documentation

## 2020-07-25 DIAGNOSIS — C3412 Malignant neoplasm of upper lobe, left bronchus or lung: Secondary | ICD-10-CM

## 2020-07-25 LAB — CBC WITH DIFFERENTIAL/PLATELET
Abs Immature Granulocytes: 0.02 10*3/uL (ref 0.00–0.07)
Basophils Absolute: 0 10*3/uL (ref 0.0–0.1)
Basophils Relative: 0 %
Eosinophils Absolute: 0.2 10*3/uL (ref 0.0–0.5)
Eosinophils Relative: 3 %
HCT: 37.9 % — ABNORMAL LOW (ref 39.0–52.0)
Hemoglobin: 12.7 g/dL — ABNORMAL LOW (ref 13.0–17.0)
Immature Granulocytes: 0 %
Lymphocytes Relative: 8 %
Lymphs Abs: 0.6 10*3/uL — ABNORMAL LOW (ref 0.7–4.0)
MCH: 32.6 pg (ref 26.0–34.0)
MCHC: 33.5 g/dL (ref 30.0–36.0)
MCV: 97.4 fL (ref 80.0–100.0)
Monocytes Absolute: 0.7 10*3/uL (ref 0.1–1.0)
Monocytes Relative: 9 %
Neutro Abs: 5.9 10*3/uL (ref 1.7–7.7)
Neutrophils Relative %: 80 %
Platelets: 144 10*3/uL — ABNORMAL LOW (ref 150–400)
RBC: 3.89 MIL/uL — ABNORMAL LOW (ref 4.22–5.81)
RDW: 12.9 % (ref 11.5–15.5)
WBC: 7.4 10*3/uL (ref 4.0–10.5)
nRBC: 0 % (ref 0.0–0.2)

## 2020-07-25 LAB — CMP (CANCER CENTER ONLY)
ALT: 15 U/L (ref 0–44)
AST: 21 U/L (ref 15–41)
Albumin: 3.6 g/dL (ref 3.5–5.0)
Alkaline Phosphatase: 56 U/L (ref 38–126)
Anion gap: 7 (ref 5–15)
BUN: 22 mg/dL (ref 8–23)
CO2: 27 mmol/L (ref 22–32)
Calcium: 9.1 mg/dL (ref 8.9–10.3)
Chloride: 105 mmol/L (ref 98–111)
Creatinine: 0.89 mg/dL (ref 0.61–1.24)
GFR, Estimated: >60 mL/min (ref >60)
Glucose, Bld: 87 mg/dL (ref 70–99)
Potassium: 4.3 mmol/L (ref 3.5–5.1)
Sodium: 139 mmol/L (ref 135–145)
Total Bilirubin: 0.6 mg/dL (ref 0.3–1.2)
Total Protein: 6.5 g/dL (ref 6.5–8.1)

## 2020-07-25 MED ORDER — DIPHENHYDRAMINE HCL 50 MG/ML IJ SOLN
25.0000 mg | Freq: Once | INTRAMUSCULAR | Status: AC
  Administered 2020-07-25: 25 mg via INTRAVENOUS

## 2020-07-25 MED ORDER — SODIUM CHLORIDE 0.9 % IV SOLN
200.0000 mg | Freq: Once | INTRAVENOUS | Status: AC
  Administered 2020-07-25: 200 mg via INTRAVENOUS
  Filled 2020-07-25: qty 8

## 2020-07-25 MED ORDER — FAMOTIDINE IN NACL 20-0.9 MG/50ML-% IV SOLN
INTRAVENOUS | Status: AC
  Filled 2020-07-25: qty 50

## 2020-07-25 MED ORDER — SODIUM CHLORIDE 0.9 % IV SOLN
Freq: Once | INTRAVENOUS | Status: AC
  Filled 2020-07-25: qty 250

## 2020-07-25 MED ORDER — SODIUM CHLORIDE 0.9% FLUSH
10.0000 mL | INTRAVENOUS | Status: DC | PRN
  Administered 2020-07-25: 10 mL
  Filled 2020-07-25: qty 10

## 2020-07-25 MED ORDER — HEPARIN SOD (PORK) LOCK FLUSH 100 UNIT/ML IV SOLN
500.0000 [IU] | Freq: Once | INTRAVENOUS | Status: AC | PRN
  Administered 2020-07-25: 500 [IU]
  Filled 2020-07-25: qty 5

## 2020-07-25 MED ORDER — FAMOTIDINE IN NACL 20-0.9 MG/50ML-% IV SOLN
20.0000 mg | Freq: Once | INTRAVENOUS | Status: AC
  Administered 2020-07-25: 20 mg via INTRAVENOUS

## 2020-07-25 MED ORDER — DIPHENHYDRAMINE HCL 50 MG/ML IJ SOLN
INTRAMUSCULAR | Status: AC
  Filled 2020-07-25: qty 1

## 2020-07-25 NOTE — Patient Instructions (Signed)
[facility] Discharge Instructions for Patients Receiving Chemotherapy  Today you received the following chemotherapy agents: Pembrolizumab [facility]).  To help prevent nausea and vomiting after your treatment, we encourage you to take your nausea medication as directed.   If you develop nausea and vomiting that is not controlled by your nausea medication, call the clinic.   BELOW ARE SYMPTOMS THAT SHOULD BE REPORTED IMMEDIATELY:  *FEVER GREATER THAN 100.5 F  *CHILLS WITH OR WITHOUT FEVER  NAUSEA AND VOMITING THAT IS NOT CONTROLLED WITH YOUR NAUSEA MEDICATION  *UNUSUAL SHORTNESS OF BREATH  *UNUSUAL BRUISING OR BLEEDING  TENDERNESS IN MOUTH AND THROAT WITH OR WITHOUT PRESENCE OF ULCERS  *URINARY PROBLEMS  *BOWEL PROBLEMS  UNUSUAL RASH Items with * indicate a potential emergency and should be followed up as soon as possible.  Feel free to call the clinic should you have any questions or concerns. The clinic phone number is [phone].  Please show the [place] at check-in to the Emergency Department and triage nurse.

## 2020-08-08 ENCOUNTER — Other Ambulatory Visit: Payer: Self-pay | Admitting: *Deleted

## 2020-08-08 DIAGNOSIS — C3492 Malignant neoplasm of unspecified part of left bronchus or lung: Secondary | ICD-10-CM

## 2020-08-08 MED ORDER — ONDANSETRON HCL 8 MG PO TABS: 8.0000 mg | ORAL_TABLET | Freq: Three times a day (TID) | ORAL | 2 refills | Status: DC | PRN

## 2020-08-08 NOTE — Telephone Encounter (Signed)
Refill requested for Fentanyl and Morphine. Refill sent to [facility]

## 2020-08-08 NOTE — Telephone Encounter (Signed)
Refill requested for Zofran

## 2020-08-09 MED ORDER — MORPHINE SULFATE 15 MG PO TABS: 15.0000 mg | ORAL_TABLET | ORAL | 0 refills | Status: DC | PRN

## 2020-08-09 MED ORDER — FENTANYL 50 MCG/HR TD PT72: 1.0000 | MEDICATED_PATCH | TRANSDERMAL | 0 refills | Status: DC

## 2020-08-14 ENCOUNTER — Other Ambulatory Visit: Payer: Self-pay

## 2020-08-14 ENCOUNTER — Ambulatory Visit (HOSPITAL_COMMUNITY)
Admission: RE | Admit: 2020-08-14 | Discharge: 2020-08-14 | Disposition: A | Discharge status: Home / Self Care | Payer: No Typology Code available for payment source | Source: Ambulatory Visit | Attending: Hematology | Admitting: Hematology

## 2020-08-14 DIAGNOSIS — C3492 Malignant neoplasm of unspecified part of left bronchus or lung: Secondary | ICD-10-CM | POA: Diagnosis not present

## 2020-08-14 DIAGNOSIS — C7951 Secondary malignant neoplasm of bone: Secondary | ICD-10-CM | POA: Diagnosis present

## 2020-08-15 ENCOUNTER — Inpatient Hospital Stay: Payer: Medicare Other

## 2020-08-15 ENCOUNTER — Other Ambulatory Visit: Payer: Self-pay

## 2020-08-15 ENCOUNTER — Inpatient Hospital Stay (HOSPITAL_BASED_OUTPATIENT_CLINIC_OR_DEPARTMENT_OTHER): Payer: Medicare Other | Admitting: Hematology

## 2020-08-15 ENCOUNTER — Inpatient Hospital Stay: Payer: Medicare Other | Attending: Hematology

## 2020-08-15 VITALS — BP 103/61 | HR 64 | Temp 97.9°F | Resp 15 | Ht 70.0 in | Wt 121.5 lb

## 2020-08-15 DIAGNOSIS — C7951 Secondary malignant neoplasm of bone: Secondary | ICD-10-CM | POA: Diagnosis not present

## 2020-08-15 DIAGNOSIS — I10 Essential (primary) hypertension: Secondary | ICD-10-CM | POA: Insufficient documentation

## 2020-08-15 DIAGNOSIS — Z8551 Personal history of malignant neoplasm of bladder: Secondary | ICD-10-CM | POA: Diagnosis not present

## 2020-08-15 DIAGNOSIS — Z8249 Family history of ischemic heart disease and other diseases of the circulatory system: Secondary | ICD-10-CM | POA: Insufficient documentation

## 2020-08-15 DIAGNOSIS — Z801 Family history of malignant neoplasm of trachea, bronchus and lung: Secondary | ICD-10-CM | POA: Diagnosis not present

## 2020-08-15 DIAGNOSIS — I252 Old myocardial infarction: Secondary | ICD-10-CM | POA: Diagnosis not present

## 2020-08-15 DIAGNOSIS — E785 Hyperlipidemia, unspecified: Secondary | ICD-10-CM | POA: Diagnosis not present

## 2020-08-15 DIAGNOSIS — Z9861 Coronary angioplasty status: Secondary | ICD-10-CM | POA: Diagnosis not present

## 2020-08-15 DIAGNOSIS — G893 Neoplasm related pain (acute) (chronic): Secondary | ICD-10-CM | POA: Diagnosis not present

## 2020-08-15 DIAGNOSIS — Z808 Family history of malignant neoplasm of other organs or systems: Secondary | ICD-10-CM | POA: Insufficient documentation

## 2020-08-15 DIAGNOSIS — I2510 Atherosclerotic heart disease of native coronary artery without angina pectoris: Secondary | ICD-10-CM | POA: Diagnosis not present

## 2020-08-15 DIAGNOSIS — F17210 Nicotine dependence, cigarettes, uncomplicated: Secondary | ICD-10-CM | POA: Insufficient documentation

## 2020-08-15 DIAGNOSIS — C3492 Malignant neoplasm of unspecified part of left bronchus or lung: Secondary | ICD-10-CM

## 2020-08-15 DIAGNOSIS — Z5112 Encounter for antineoplastic immunotherapy: Secondary | ICD-10-CM | POA: Insufficient documentation

## 2020-08-15 DIAGNOSIS — C3412 Malignant neoplasm of upper lobe, left bronchus or lung: Secondary | ICD-10-CM | POA: Diagnosis not present

## 2020-08-15 DIAGNOSIS — Z7189 Other specified counseling: Secondary | ICD-10-CM

## 2020-08-15 DIAGNOSIS — Z85828 Personal history of other malignant neoplasm of skin: Secondary | ICD-10-CM | POA: Insufficient documentation

## 2020-08-15 DIAGNOSIS — Z79899 Other long term (current) drug therapy: Secondary | ICD-10-CM | POA: Diagnosis not present

## 2020-08-15 DIAGNOSIS — Z833 Family history of diabetes mellitus: Secondary | ICD-10-CM | POA: Diagnosis not present

## 2020-08-15 LAB — CBC WITH DIFFERENTIAL/PLATELET
Abs Immature Granulocytes: 0.02 10*3/uL (ref 0.00–0.07)
Basophils Absolute: 0 10*3/uL (ref 0.0–0.1)
Basophils Relative: 0 %
Eosinophils Absolute: 0.2 10*3/uL (ref 0.0–0.5)
Eosinophils Relative: 3 %
HCT: 37.3 % — ABNORMAL LOW (ref 39.0–52.0)
Hemoglobin: 12.6 g/dL — ABNORMAL LOW (ref 13.0–17.0)
Immature Granulocytes: 0 %
Lymphocytes Relative: 7 %
Lymphs Abs: 0.5 10*3/uL — ABNORMAL LOW (ref 0.7–4.0)
MCH: 32.5 pg (ref 26.0–34.0)
MCHC: 33.8 g/dL (ref 30.0–36.0)
MCV: 96.1 fL (ref 80.0–100.0)
Monocytes Absolute: 0.7 10*3/uL (ref 0.1–1.0)
Monocytes Relative: 9 %
Neutro Abs: 6.4 10*3/uL (ref 1.7–7.7)
Neutrophils Relative %: 81 %
Platelets: 137 10*3/uL — ABNORMAL LOW (ref 150–400)
RBC: 3.88 MIL/uL — ABNORMAL LOW (ref 4.22–5.81)
RDW: 13.1 % (ref 11.5–15.5)
WBC: 8 10*3/uL (ref 4.0–10.5)
nRBC: 0 % (ref 0.0–0.2)

## 2020-08-15 LAB — CMP (CANCER CENTER ONLY)
ALT: 13 U/L (ref 0–44)
AST: 21 U/L (ref 15–41)
Albumin: 3.6 g/dL (ref 3.5–5.0)
Alkaline Phosphatase: 60 U/L (ref 38–126)
Anion gap: 5 (ref 5–15)
BUN: 21 mg/dL (ref 8–23)
CO2: 27 mmol/L (ref 22–32)
Calcium: 9.2 mg/dL (ref 8.9–10.3)
Chloride: 105 mmol/L (ref 98–111)
Creatinine: 0.93 mg/dL (ref 0.61–1.24)
GFR, Estimated: >60 mL/min (ref >60)
Glucose, Bld: 87 mg/dL (ref 70–99)
Potassium: 4.3 mmol/L (ref 3.5–5.1)
Sodium: 137 mmol/L (ref 135–145)
Total Bilirubin: 0.5 mg/dL (ref 0.3–1.2)
Total Protein: 6.6 g/dL (ref 6.5–8.1)

## 2020-08-15 LAB — TSH: TSH: 1.704 u[IU]/mL (ref 0.320–4.118)

## 2020-08-15 MED ORDER — DENOSUMAB 120 MG/1.7ML ~~LOC~~ SOLN
SUBCUTANEOUS | Status: AC
  Filled 2020-08-15: qty 1.7

## 2020-08-15 MED ORDER — FAMOTIDINE IN NACL 20-0.9 MG/50ML-% IV SOLN
INTRAVENOUS | Status: AC
  Filled 2020-08-15: qty 50

## 2020-08-15 MED ORDER — DENOSUMAB 120 MG/1.7ML ~~LOC~~ SOLN
120.0000 mg | Freq: Once | SUBCUTANEOUS | Status: AC
  Administered 2020-08-15: 120 mg via SUBCUTANEOUS

## 2020-08-15 MED ORDER — DIPHENHYDRAMINE HCL 50 MG/ML IJ SOLN
INTRAMUSCULAR | Status: AC
  Filled 2020-08-15: qty 1

## 2020-08-15 MED ORDER — DIPHENHYDRAMINE HCL 50 MG/ML IJ SOLN
25.0000 mg | Freq: Once | INTRAMUSCULAR | Status: AC
  Administered 2020-08-15: 25 mg via INTRAVENOUS

## 2020-08-15 MED ORDER — HEPARIN SOD (PORK) LOCK FLUSH 100 UNIT/ML IV SOLN
500.0000 [IU] | Freq: Once | INTRAVENOUS | Status: AC | PRN
  Administered 2020-08-15: 500 [IU]
  Filled 2020-08-15: qty 5

## 2020-08-15 MED ORDER — FAMOTIDINE IN NACL 20-0.9 MG/50ML-% IV SOLN
20.0000 mg | Freq: Once | INTRAVENOUS | Status: AC
  Administered 2020-08-15: 20 mg via INTRAVENOUS

## 2020-08-15 MED ORDER — SODIUM CHLORIDE 0.9 % IV SOLN
200.0000 mg | Freq: Once | INTRAVENOUS | Status: AC
  Administered 2020-08-15: 200 mg via INTRAVENOUS
  Filled 2020-08-15: qty 8

## 2020-08-15 MED ORDER — SODIUM CHLORIDE 0.9% FLUSH
10.0000 mL | INTRAVENOUS | Status: DC | PRN
  Administered 2020-08-15: 10 mL
  Filled 2020-08-15: qty 10

## 2020-08-15 MED ORDER — SODIUM CHLORIDE 0.9 % IV SOLN
Freq: Once | INTRAVENOUS | Status: AC
  Filled 2020-08-15: qty 250
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
## 2020-08-15 NOTE — Patient Instructions (Signed)

## 2020-08-15 NOTE — Progress Notes (Signed)
[facility] discharged [facility] the [facility] in stable condition and with no signs of acute distress.    

## 2020-08-15 NOTE — Patient Instructions (Signed)
[facility] Discharge Instructions for Patients Receiving Chemotherapy  Today you received the following chemotherapy agents: Keytruda  To help prevent nausea and vomiting after your treatment, we encourage you to take your nausea medication  as prescribed.    If you develop nausea and vomiting that is not controlled by your nausea medication, call the clinic.   BELOW ARE SYMPTOMS THAT SHOULD BE REPORTED IMMEDIATELY:  *FEVER GREATER THAN 100.5 F  *CHILLS WITH OR WITHOUT FEVER  NAUSEA AND VOMITING THAT IS NOT CONTROLLED WITH YOUR NAUSEA MEDICATION  *UNUSUAL SHORTNESS OF BREATH  *UNUSUAL BRUISING OR BLEEDING  TENDERNESS IN MOUTH AND THROAT WITH OR WITHOUT PRESENCE OF ULCERS  *URINARY PROBLEMS  *BOWEL PROBLEMS  UNUSUAL RASH Items with * indicate a potential emergency and should be followed up as soon as possible.  Feel free to call the clinic should you have any questions or concerns. The clinic phone number is [phone].  Please show the [employer] at check-in to the Emergency Department and triage nurse.  Denosumab injection What is this medicine? DENOSUMAB (den oh sue mab) slows bone breakdown. Prolia is used to treat osteoporosis in women after menopause and in men, and in people who are taking corticosteroids for 6 months or more. Delton See is used to treat a high calcium level due to cancer and to prevent bone fractures and other bone problems caused by multiple myeloma or cancer bone metastases. Delton See is also used to treat giant cell tumor of the bone. This medicine may be used for other purposes; ask your health care provider or pharmacist if you have questions. COMMON BRAND NAME(S): Prolia, XGEVA What should I tell my health care provider before I take this medicine? They need to know if you have any of these conditions:  dental disease  having surgery or tooth extraction  infection  kidney disease  low levels of calcium or Vitamin D in  the blood  malnutrition  on hemodialysis  skin conditions or sensitivity  thyroid or parathyroid disease  an unusual reaction to denosumab, other medicines, foods, dyes, or preservatives  pregnant or trying to get pregnant  breast-feeding How should I use this medicine? This medicine is for injection under the skin. It is given by a health care professional in a hospital or clinic setting. A special MedGuide will be given to you before each treatment. Be sure to read this information carefully each time. For Prolia, talk to your pediatrician regarding the use of this medicine in children. Special care may be needed. For Delton See, talk to your pediatrician regarding the use of this medicine in children. While this drug may be prescribed for children as young as 13 years for selected conditions, precautions do apply. Overdosage: If you think you have taken too much of this medicine contact a poison control center or emergency room at once. NOTE: This medicine is only for you. Do not share this medicine with others. What if I miss a dose? It is important not to miss your dose. Call your doctor or health care professional if you are unable to keep an appointment. What may interact with this medicine? Do not take this medicine with any of the following medications:  other medicines containing denosumab This medicine may also interact with the following medications:  medicines that lower your chance of fighting infection  steroid medicines like prednisone or cortisone This list may not describe all possible interactions. Give your health care provider a list of all the medicines,  herbs, non-prescription drugs, or dietary supplements you use. Also tell them if you smoke, drink alcohol, or use illegal drugs. Some items may interact with your medicine. What should I watch for while using this medicine? Visit your doctor or health care professional for regular checks on your progress. Your  doctor or health care professional may order blood tests and other tests to see how you are doing. Call your doctor or health care professional for advice if you get a fever, chills or sore throat, or other symptoms of a cold or flu. Do not treat yourself. This drug may decrease your body's ability to fight infection. Try to avoid being around people who are sick. You should make sure you get enough calcium and vitamin D while you are taking this medicine, unless your doctor tells you not to. Discuss the foods you eat and the vitamins you take with your health care professional. See your dentist regularly. Brush and floss your teeth as directed. Before you have any dental work done, tell your dentist you are receiving this medicine. Do not become pregnant while taking this medicine or for 5 months after stopping it. Talk with your doctor or health care professional about your birth control options while taking this medicine. Women should inform their doctor if they wish to become pregnant or think they might be pregnant. There is a potential for serious side effects to an unborn child. Talk to your health care professional or pharmacist for more information. What side effects may I notice from receiving this medicine? Side effects that you should report to your doctor or health care professional as soon as possible:  allergic reactions like skin rash, itching or hives, swelling of the face, lips, or tongue  bone pain  breathing problems  dizziness  jaw pain, especially after dental work  redness, blistering, peeling of the skin  signs and symptoms of infection like fever or chills; cough; sore throat; pain or trouble passing urine  signs of low calcium like fast heartbeat, muscle cramps or muscle pain; pain, tingling, numbness in the hands or feet; seizures  unusual bleeding or bruising  unusually weak or tired Side effects that usually do not require medical attention (report to your  doctor or health care professional if they continue or are bothersome):  constipation  diarrhea  headache  joint pain  loss of appetite  muscle pain  runny nose  tiredness  upset stomach This list may not describe all possible side effects. Call your doctor for medical advice about side effects. You may report side effects to FDA at 1-800-FDA-1088. Where should I keep my medicine? This medicine is only given in a clinic, doctor's office, or other health care setting and will not be stored at home. NOTE: This sheet is a summary. It may not cover all possible information. If you have questions about this medicine, talk to your doctor, pharmacist, or health care provider.  2020 [employer] Standard (2018-01-01 16:10:44)

## 2020-08-29 ENCOUNTER — Telehealth: Payer: Self-pay | Admitting: Hematology

## 2020-08-29 NOTE — Telephone Encounter (Signed)
Scheduled per 12/08 los, patient has been called and notified of upcoming appointments.

## 2020-09-05 ENCOUNTER — Inpatient Hospital Stay: Payer: Medicare Other

## 2020-09-05 ENCOUNTER — Other Ambulatory Visit: Payer: Self-pay

## 2020-09-05 VITALS — BP 116/56 | HR 56 | Temp 97.7°F | Resp 16

## 2020-09-05 DIAGNOSIS — C3492 Malignant neoplasm of unspecified part of left bronchus or lung: Secondary | ICD-10-CM

## 2020-09-05 DIAGNOSIS — C3412 Malignant neoplasm of upper lobe, left bronchus or lung: Secondary | ICD-10-CM

## 2020-09-05 DIAGNOSIS — Z85828 Personal history of other malignant neoplasm of skin: Secondary | ICD-10-CM | POA: Diagnosis not present

## 2020-09-05 DIAGNOSIS — G893 Neoplasm related pain (acute) (chronic): Secondary | ICD-10-CM | POA: Diagnosis not present

## 2020-09-05 DIAGNOSIS — Z5112 Encounter for antineoplastic immunotherapy: Secondary | ICD-10-CM

## 2020-09-05 DIAGNOSIS — Z7189 Other specified counseling: Secondary | ICD-10-CM

## 2020-09-05 DIAGNOSIS — C7951 Secondary malignant neoplasm of bone: Secondary | ICD-10-CM | POA: Diagnosis not present

## 2020-09-05 DIAGNOSIS — Z8551 Personal history of malignant neoplasm of bladder: Secondary | ICD-10-CM | POA: Diagnosis not present

## 2020-09-05 LAB — CMP (CANCER CENTER ONLY)
ALT: 17 U/L (ref 0–44)
AST: 19 U/L (ref 15–41)
Albumin: 3.7 g/dL (ref 3.5–5.0)
Alkaline Phosphatase: 56 U/L (ref 38–126)
Anion gap: 7 (ref 5–15)
BUN: 30 mg/dL — ABNORMAL HIGH (ref 8–23)
CO2: 30 mmol/L (ref 22–32)
Calcium: 9.7 mg/dL (ref 8.9–10.3)
Chloride: 105 mmol/L (ref 98–111)
Creatinine: 1.2 mg/dL (ref 0.61–1.24)
GFR, Estimated: >60 mL/min (ref >60)
Glucose, Bld: 87 mg/dL (ref 70–99)
Potassium: 4.6 mmol/L (ref 3.5–5.1)
Sodium: 142 mmol/L (ref 135–145)
Total Bilirubin: 0.4 mg/dL (ref 0.3–1.2)
Total Protein: 6.8 g/dL (ref 6.5–8.1)

## 2020-09-05 LAB — CBC WITH DIFFERENTIAL/PLATELET
Abs Immature Granulocytes: 0.02 10*3/uL (ref 0.00–0.07)
Basophils Absolute: 0 10*3/uL (ref 0.0–0.1)
Basophils Relative: 1 %
Eosinophils Absolute: 0.3 10*3/uL (ref 0.0–0.5)
Eosinophils Relative: 4 %
HCT: 37.6 % — ABNORMAL LOW (ref 39.0–52.0)
Hemoglobin: 12.5 g/dL — ABNORMAL LOW (ref 13.0–17.0)
Immature Granulocytes: 0 %
Lymphocytes Relative: 11 %
Lymphs Abs: 0.7 10*3/uL (ref 0.7–4.0)
MCH: 32.3 pg (ref 26.0–34.0)
MCHC: 33.2 g/dL (ref 30.0–36.0)
MCV: 97.2 fL (ref 80.0–100.0)
Monocytes Absolute: 0.7 10*3/uL (ref 0.1–1.0)
Monocytes Relative: 11 %
Neutro Abs: 4.7 10*3/uL (ref 1.7–7.7)
Neutrophils Relative %: 73 %
Platelets: 151 10*3/uL (ref 150–400)
RBC: 3.87 MIL/uL — ABNORMAL LOW (ref 4.22–5.81)
RDW: 13.3 % (ref 11.5–15.5)
WBC: 6.4 10*3/uL (ref 4.0–10.5)
nRBC: 0 % (ref 0.0–0.2)

## 2020-09-05 MED ORDER — FAMOTIDINE IN NACL 20-0.9 MG/50ML-% IV SOLN
INTRAVENOUS | Status: AC
  Filled 2020-09-05: qty 50

## 2020-09-05 MED ORDER — DIPHENHYDRAMINE HCL 50 MG/ML IJ SOLN
25.0000 mg | Freq: Once | INTRAMUSCULAR | Status: AC
Start: 2020-09-05 — End: 2020-09-05
  Administered 2020-09-05: 25 mg via INTRAVENOUS

## 2020-09-05 MED ORDER — FAMOTIDINE IN NACL 20-0.9 MG/50ML-% IV SOLN
20.0000 mg | Freq: Once | INTRAVENOUS | Status: AC
  Administered 2020-09-05: 20 mg via INTRAVENOUS

## 2020-09-05 MED ORDER — SODIUM CHLORIDE 0.9 % IV SOLN
200.0000 mg | Freq: Once | INTRAVENOUS | Status: AC
  Administered 2020-09-05: 200 mg via INTRAVENOUS
  Filled 2020-09-05: qty 8

## 2020-09-05 MED ORDER — HEPARIN SOD (PORK) LOCK FLUSH 100 UNIT/ML IV SOLN
500.0000 [IU] | Freq: Once | INTRAVENOUS | Status: AC | PRN
  Administered 2020-09-05: 500 [IU]
  Filled 2020-09-05: qty 5

## 2020-09-05 MED ORDER — SODIUM CHLORIDE 0.9 % IV SOLN
Freq: Once | INTRAVENOUS | Status: AC
  Filled 2020-09-05: qty 250

## 2020-09-05 MED ORDER — SODIUM CHLORIDE 0.9% FLUSH
10.0000 mL | INTRAVENOUS | Status: DC | PRN
  Administered 2020-09-05: 10 mL
  Filled 2020-09-05: qty 10

## 2020-09-05 MED ORDER — DIPHENHYDRAMINE HCL 50 MG/ML IJ SOLN
INTRAMUSCULAR | Status: AC
  Filled 2020-09-05: qty 1

## 2020-09-05 NOTE — Patient Instructions (Signed)
[facility] Discharge Instructions for Patients Receiving Chemotherapy  Today you received the following chemotherapy agents: pembrolizumab.  To help prevent nausea and vomiting after your treatment, we encourage you to take your nausea medication as directed.   If you develop nausea and vomiting that is not controlled by your nausea medication, call the clinic.   BELOW ARE SYMPTOMS THAT SHOULD BE REPORTED IMMEDIATELY:  *FEVER GREATER THAN 100.5 F  *CHILLS WITH OR WITHOUT FEVER  NAUSEA AND VOMITING THAT IS NOT CONTROLLED WITH YOUR NAUSEA MEDICATION  *UNUSUAL SHORTNESS OF BREATH  *UNUSUAL BRUISING OR BLEEDING  TENDERNESS IN MOUTH AND THROAT WITH OR WITHOUT PRESENCE OF ULCERS  *URINARY PROBLEMS  *BOWEL PROBLEMS  UNUSUAL RASH Items with * indicate a potential emergency and should be followed up as soon as possible.  Feel free to call the clinic should you have any questions or concerns. The clinic phone number is [phone].  Please show the CHEMO ALERT CARD at check-in to the Emergency Department and triage nurse.   

## 2020-09-13 ENCOUNTER — Other Ambulatory Visit: Payer: Self-pay | Admitting: *Deleted

## 2020-09-13 DIAGNOSIS — C3492 Malignant neoplasm of unspecified part of left bronchus or lung: Secondary | ICD-10-CM

## 2020-09-14 MED ORDER — FENTANYL 50 MCG/HR TD PT72: 1.0000 | MEDICATED_PATCH | TRANSDERMAL | 0 refills | Status: DC

## 2020-09-14 MED ORDER — ONDANSETRON HCL 8 MG PO TABS: 8.0000 mg | ORAL_TABLET | Freq: Three times a day (TID) | ORAL | 2 refills | Status: DC | PRN

## 2020-09-14 MED ORDER — MORPHINE SULFATE 15 MG PO TABS: 15.0000 mg | ORAL_TABLET | ORAL | 0 refills | Status: DC | PRN
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
## 2020-09-26 ENCOUNTER — Inpatient Hospital Stay: Payer: Medicare Other | Attending: Hematology

## 2020-09-26 ENCOUNTER — Inpatient Hospital Stay: Payer: Medicare Other

## 2020-09-26 ENCOUNTER — Inpatient Hospital Stay (HOSPITAL_BASED_OUTPATIENT_CLINIC_OR_DEPARTMENT_OTHER): Payer: Medicare Other | Admitting: Hematology

## 2020-09-26 ENCOUNTER — Other Ambulatory Visit: Payer: Self-pay

## 2020-09-26 VITALS — BP 107/59 | HR 70 | Temp 98.1°F | Resp 18 | Ht 70.0 in | Wt 125.7 lb

## 2020-09-26 DIAGNOSIS — C3412 Malignant neoplasm of upper lobe, left bronchus or lung: Secondary | ICD-10-CM

## 2020-09-26 DIAGNOSIS — C3492 Malignant neoplasm of unspecified part of left bronchus or lung: Secondary | ICD-10-CM

## 2020-09-26 DIAGNOSIS — Z79899 Other long term (current) drug therapy: Secondary | ICD-10-CM | POA: Diagnosis not present

## 2020-09-26 DIAGNOSIS — Z85828 Personal history of other malignant neoplasm of skin: Secondary | ICD-10-CM | POA: Insufficient documentation

## 2020-09-26 DIAGNOSIS — Z833 Family history of diabetes mellitus: Secondary | ICD-10-CM | POA: Insufficient documentation

## 2020-09-26 DIAGNOSIS — Z8249 Family history of ischemic heart disease and other diseases of the circulatory system: Secondary | ICD-10-CM | POA: Diagnosis not present

## 2020-09-26 DIAGNOSIS — C7951 Secondary malignant neoplasm of bone: Secondary | ICD-10-CM | POA: Insufficient documentation

## 2020-09-26 DIAGNOSIS — F17210 Nicotine dependence, cigarettes, uncomplicated: Secondary | ICD-10-CM | POA: Diagnosis not present

## 2020-09-26 DIAGNOSIS — G893 Neoplasm related pain (acute) (chronic): Secondary | ICD-10-CM | POA: Diagnosis not present

## 2020-09-26 DIAGNOSIS — Z801 Family history of malignant neoplasm of trachea, bronchus and lung: Secondary | ICD-10-CM | POA: Diagnosis not present

## 2020-09-26 DIAGNOSIS — I10 Essential (primary) hypertension: Secondary | ICD-10-CM | POA: Diagnosis not present

## 2020-09-26 DIAGNOSIS — Z5112 Encounter for antineoplastic immunotherapy: Secondary | ICD-10-CM | POA: Insufficient documentation

## 2020-09-26 DIAGNOSIS — Z7901 Long term (current) use of anticoagulants: Secondary | ICD-10-CM | POA: Diagnosis not present

## 2020-09-26 DIAGNOSIS — Z7189 Other specified counseling: Secondary | ICD-10-CM

## 2020-09-26 DIAGNOSIS — Z8551 Personal history of malignant neoplasm of bladder: Secondary | ICD-10-CM | POA: Insufficient documentation

## 2020-09-26 LAB — CBC WITH DIFFERENTIAL/PLATELET
Abs Immature Granulocytes: 0.02 10*3/uL (ref 0.00–0.07)
Basophils Absolute: 0 10*3/uL (ref 0.0–0.1)
Basophils Relative: 0 %
Eosinophils Absolute: 0.2 10*3/uL (ref 0.0–0.5)
Eosinophils Relative: 2 %
HCT: 37 % — ABNORMAL LOW (ref 39.0–52.0)
Hemoglobin: 12.4 g/dL — ABNORMAL LOW (ref 13.0–17.0)
Immature Granulocytes: 0 %
Lymphocytes Relative: 8 %
Lymphs Abs: 0.6 10*3/uL — ABNORMAL LOW (ref 0.7–4.0)
MCH: 32.3 pg (ref 26.0–34.0)
MCHC: 33.5 g/dL (ref 30.0–36.0)
MCV: 96.4 fL (ref 80.0–100.0)
Monocytes Absolute: 0.8 10*3/uL (ref 0.1–1.0)
Monocytes Relative: 10 %
Neutro Abs: 6.2 10*3/uL (ref 1.7–7.7)
Neutrophils Relative %: 80 %
Platelets: 134 10*3/uL — ABNORMAL LOW (ref 150–400)
RBC: 3.84 MIL/uL — ABNORMAL LOW (ref 4.22–5.81)
RDW: 13.2 % (ref 11.5–15.5)
WBC: 7.8 10*3/uL (ref 4.0–10.5)
nRBC: 0 % (ref 0.0–0.2)

## 2020-09-26 LAB — CMP (CANCER CENTER ONLY)
ALT: 16 U/L (ref 0–44)
AST: 24 U/L (ref 15–41)
Albumin: 3.8 g/dL (ref 3.5–5.0)
Alkaline Phosphatase: 49 U/L (ref 38–126)
Anion gap: 10 (ref 5–15)
BUN: 27 mg/dL — ABNORMAL HIGH (ref 8–23)
CO2: 24 mmol/L (ref 22–32)
Calcium: 9.9 mg/dL (ref 8.9–10.3)
Chloride: 104 mmol/L (ref 98–111)
Creatinine: 1.11 mg/dL (ref 0.61–1.24)
GFR, Estimated: >60 mL/min (ref >60)
Glucose, Bld: 95 mg/dL (ref 70–99)
Potassium: 4.1 mmol/L (ref 3.5–5.1)
Sodium: 138 mmol/L (ref 135–145)
Total Bilirubin: 0.6 mg/dL (ref 0.3–1.2)
Total Protein: 7 g/dL (ref 6.5–8.1)

## 2020-09-26 MED ORDER — DIPHENHYDRAMINE HCL 50 MG/ML IJ SOLN
25.0000 mg | Freq: Once | INTRAMUSCULAR | Status: AC
  Administered 2020-09-26: 25 mg via INTRAVENOUS

## 2020-09-26 MED ORDER — FAMOTIDINE IN NACL 20-0.9 MG/50ML-% IV SOLN
20.0000 mg | Freq: Once | INTRAVENOUS | Status: AC
  Administered 2020-09-26: 20 mg via INTRAVENOUS

## 2020-09-26 MED ORDER — HEPARIN SOD (PORK) LOCK FLUSH 100 UNIT/ML IV SOLN
500.0000 [IU] | Freq: Once | INTRAVENOUS | Status: AC | PRN
  Administered 2020-09-26: 500 [IU]
  Filled 2020-09-26: qty 5

## 2020-09-26 MED ORDER — SODIUM CHLORIDE 0.9 % IV SOLN
200.0000 mg | Freq: Once | INTRAVENOUS | Status: AC
  Administered 2020-09-26: 200 mg via INTRAVENOUS
  Filled 2020-09-26: qty 8

## 2020-09-26 MED ORDER — DENOSUMAB 120 MG/1.7ML ~~LOC~~ SOLN
120.0000 mg | Freq: Once | SUBCUTANEOUS | Status: AC
  Administered 2020-09-26: 120 mg via SUBCUTANEOUS

## 2020-09-26 MED ORDER — FAMOTIDINE IN NACL 20-0.9 MG/50ML-% IV SOLN
INTRAVENOUS | Status: AC
  Filled 2020-09-26: qty 50

## 2020-09-26 MED ORDER — DIPHENHYDRAMINE HCL 50 MG/ML IJ SOLN
INTRAMUSCULAR | Status: AC
  Filled 2020-09-26: qty 1

## 2020-09-26 MED ORDER — DENOSUMAB 120 MG/1.7ML ~~LOC~~ SOLN
SUBCUTANEOUS | Status: AC
  Filled 2020-09-26: qty 1.7

## 2020-09-26 MED ORDER — SODIUM CHLORIDE 0.9% FLUSH
10.0000 mL | INTRAVENOUS | Status: DC | PRN
  Administered 2020-09-26: 10 mL
  Filled 2020-09-26: qty 10

## 2020-09-26 MED ORDER — SODIUM CHLORIDE 0.9 % IV SOLN
Freq: Once | INTRAVENOUS | Status: AC
  Filled 2020-09-26: qty 250

## 2020-09-26 NOTE — Patient Instructions (Signed)
[facility] Discharge Instructions for Patients Receiving Chemotherapy  Today you received the following chemotherapy agents: pembrolizumab.  To help prevent nausea and vomiting after your treatment, we encourage you to take your nausea medication as directed.   If you develop nausea and vomiting that is not controlled by your nausea medication, call the clinic.   BELOW ARE SYMPTOMS THAT SHOULD BE REPORTED IMMEDIATELY:  *FEVER GREATER THAN 100.5 F  *CHILLS WITH OR WITHOUT FEVER  NAUSEA AND VOMITING THAT IS NOT CONTROLLED WITH YOUR NAUSEA MEDICATION  *UNUSUAL SHORTNESS OF BREATH  *UNUSUAL BRUISING OR BLEEDING  TENDERNESS IN MOUTH AND THROAT WITH OR WITHOUT PRESENCE OF ULCERS  *URINARY PROBLEMS  *BOWEL PROBLEMS  UNUSUAL RASH Items with * indicate a potential emergency and should be followed up as soon as possible.  Feel free to call the clinic should you have any questions or concerns. The clinic phone number is [phone].  Please show the [place] at check-in to the Emergency Department and triage nurse.   Denosumab injection What is this medicine? DENOSUMAB (den oh sue mab) slows bone breakdown. Prolia is used to treat osteoporosis in women after menopause and in men, and in people who are taking corticosteroids for 6 months or more. Delton See is used to treat a high calcium level due to cancer and to prevent bone fractures and other bone problems caused by multiple myeloma or cancer bone metastases. Delton See is also used to treat giant cell tumor of the bone. This medicine may be used for other purposes; ask your health care provider or pharmacist if you have questions. COMMON BRAND NAME(S): Prolia, XGEVA What should I tell my health care provider before I take this medicine? They need to know if you have any of these conditions:  dental disease  having surgery or tooth extraction  infection  kidney disease  low levels of calcium or Vitamin D in  the blood  malnutrition  on hemodialysis  skin conditions or sensitivity  thyroid or parathyroid disease  an unusual reaction to denosumab, other medicines, foods, dyes, or preservatives  pregnant or trying to get pregnant  breast-feeding How should I use this medicine? This medicine is for injection under the skin. It is given by a health care professional in a hospital or clinic setting. A special MedGuide will be given to you before each treatment. Be sure to read this information carefully each time. For Prolia, talk to your pediatrician regarding the use of this medicine in children. Special care may be needed. For Delton See, talk to your pediatrician regarding the use of this medicine in children. While this drug may be prescribed for children as young as 13 years for selected conditions, precautions do apply. Overdosage: If you think you have taken too much of this medicine contact a poison control center or emergency room at once. NOTE: This medicine is only for you. Do not share this medicine with others. What if I miss a dose? It is important not to miss your dose. Call your doctor or health care professional if you are unable to keep an appointment. What may interact with this medicine? Do not take this medicine with any of the following medications:  other medicines containing denosumab This medicine may also interact with the following medications:  medicines that lower your chance of fighting infection  steroid medicines like prednisone or cortisone This list may not describe all possible interactions. Give your health care provider a list of all the medicines, herbs,  non-prescription drugs, or dietary supplements you use. Also tell them if you smoke, drink alcohol, or use illegal drugs. Some items may interact with your medicine. What should I watch for while using this medicine? Visit your doctor or health care professional for regular checks on your progress. Your  doctor or health care professional may order blood tests and other tests to see how you are doing. Call your doctor or health care professional for advice if you get a fever, chills or sore throat, or other symptoms of a cold or flu. Do not treat yourself. This drug may decrease your body's ability to fight infection. Try to avoid being around people who are sick. You should make sure you get enough calcium and vitamin D while you are taking this medicine, unless your doctor tells you not to. Discuss the foods you eat and the vitamins you take with your health care professional. See your dentist regularly. Brush and floss your teeth as directed. Before you have any dental work done, tell your dentist you are receiving this medicine. Do not become pregnant while taking this medicine or for 5 months after stopping it. Talk with your doctor or health care professional about your birth control options while taking this medicine. Women should inform their doctor if they wish to become pregnant or think they might be pregnant. There is a potential for serious side effects to an unborn child. Talk to your health care professional or pharmacist for more information. What side effects may I notice from receiving this medicine? Side effects that you should report to your doctor or health care professional as soon as possible:  allergic reactions like skin rash, itching or hives, swelling of the face, lips, or tongue  bone pain  breathing problems  dizziness  jaw pain, especially after dental work  redness, blistering, peeling of the skin  signs and symptoms of infection like fever or chills; cough; sore throat; pain or trouble passing urine  signs of low calcium like fast heartbeat, muscle cramps or muscle pain; pain, tingling, numbness in the hands or feet; seizures  unusual bleeding or bruising  unusually weak or tired Side effects that usually do not require medical attention (report to your  doctor or health care professional if they continue or are bothersome):  constipation  diarrhea  headache  joint pain  loss of appetite  muscle pain  runny nose  tiredness  upset stomach This list may not describe all possible side effects. Call your doctor for medical advice about side effects. You may report side effects to FDA at 1-800-FDA-1088. Where should I keep my medicine? This medicine is only given in a clinic, doctor's office, or other health care setting and will not be stored at home. NOTE: This sheet is a summary. It may not cover all possible information. If you have questions about this medicine, talk to your doctor, pharmacist, or health care provider.  2021 Elsevier/Gold Standard (2018-01-01 16:10:44)

## 2020-10-02 MED ORDER — DULOXETINE HCL 30 MG PO CPEP: ORAL_CAPSULE | ORAL | 2 refills | Status: DC

## 2020-10-05 ENCOUNTER — Telehealth: Payer: Self-pay | Admitting: Hematology

## 2020-10-05 NOTE — Telephone Encounter (Signed)
Rescheduled per 02/10 appointment time, spoke with patient's relative and he will be notified.

## 2020-10-15 ENCOUNTER — Other Ambulatory Visit: Payer: Self-pay | Admitting: *Deleted

## 2020-10-15 DIAGNOSIS — C3492 Malignant neoplasm of unspecified part of left bronchus or lung: Secondary | ICD-10-CM

## 2020-10-15 NOTE — Telephone Encounter (Signed)
Wife called to request refill for Fentanyl patches and Morphine Routed to Dr. [name]

## 2020-10-16 MED ORDER — FENTANYL 50 MCG/HR TD PT72: 1.0000 | MEDICATED_PATCH | TRANSDERMAL | 0 refills | Status: DC

## 2020-10-16 MED ORDER — MORPHINE SULFATE 15 MG PO TABS: 15.0000 mg | ORAL_TABLET | ORAL | 0 refills | Status: DC | PRN

## 2020-10-17 ENCOUNTER — Other Ambulatory Visit: Payer: No Typology Code available for payment source

## 2020-10-17 ENCOUNTER — Ambulatory Visit: Payer: No Typology Code available for payment source

## 2020-10-18 ENCOUNTER — Other Ambulatory Visit: Payer: No Typology Code available for payment source

## 2020-10-18 ENCOUNTER — Other Ambulatory Visit: Payer: Self-pay

## 2020-10-18 ENCOUNTER — Inpatient Hospital Stay: Payer: No Typology Code available for payment source | Attending: Hematology

## 2020-10-18 ENCOUNTER — Inpatient Hospital Stay: Payer: No Typology Code available for payment source

## 2020-10-18 ENCOUNTER — Ambulatory Visit: Payer: No Typology Code available for payment source

## 2020-10-18 VITALS — BP 127/65 | HR 63 | Temp 98.0°F | Resp 18 | Ht 70.0 in | Wt 121.8 lb

## 2020-10-18 DIAGNOSIS — I252 Old myocardial infarction: Secondary | ICD-10-CM | POA: Insufficient documentation

## 2020-10-18 DIAGNOSIS — I10 Essential (primary) hypertension: Secondary | ICD-10-CM | POA: Insufficient documentation

## 2020-10-18 DIAGNOSIS — C3412 Malignant neoplasm of upper lobe, left bronchus or lung: Secondary | ICD-10-CM | POA: Insufficient documentation

## 2020-10-18 DIAGNOSIS — C3492 Malignant neoplasm of unspecified part of left bronchus or lung: Secondary | ICD-10-CM

## 2020-10-18 DIAGNOSIS — G893 Neoplasm related pain (acute) (chronic): Secondary | ICD-10-CM | POA: Insufficient documentation

## 2020-10-18 DIAGNOSIS — Z95828 Presence of other vascular implants and grafts: Secondary | ICD-10-CM

## 2020-10-18 DIAGNOSIS — Z5112 Encounter for antineoplastic immunotherapy: Secondary | ICD-10-CM | POA: Diagnosis not present

## 2020-10-18 DIAGNOSIS — C7951 Secondary malignant neoplasm of bone: Secondary | ICD-10-CM | POA: Diagnosis present

## 2020-10-18 DIAGNOSIS — Z79899 Other long term (current) drug therapy: Secondary | ICD-10-CM | POA: Insufficient documentation

## 2020-10-18 DIAGNOSIS — F17210 Nicotine dependence, cigarettes, uncomplicated: Secondary | ICD-10-CM | POA: Diagnosis not present

## 2020-10-18 DIAGNOSIS — Z7189 Other specified counseling: Secondary | ICD-10-CM

## 2020-10-18 LAB — CBC WITH DIFFERENTIAL/PLATELET
Abs Immature Granulocytes: 0.01 10*3/uL (ref 0.00–0.07)
Basophils Absolute: 0 10*3/uL (ref 0.0–0.1)
Basophils Relative: 0 %
Eosinophils Absolute: 0.2 10*3/uL (ref 0.0–0.5)
Eosinophils Relative: 2 %
HCT: 37.7 % — ABNORMAL LOW (ref 39.0–52.0)
Hemoglobin: 12.5 g/dL — ABNORMAL LOW (ref 13.0–17.0)
Immature Granulocytes: 0 %
Lymphocytes Relative: 9 %
Lymphs Abs: 0.7 10*3/uL (ref 0.7–4.0)
MCH: 32.1 pg (ref 26.0–34.0)
MCHC: 33.2 g/dL (ref 30.0–36.0)
MCV: 96.9 fL (ref 80.0–100.0)
Monocytes Absolute: 0.8 10*3/uL (ref 0.1–1.0)
Monocytes Relative: 10 %
Neutro Abs: 6.2 10*3/uL (ref 1.7–7.7)
Neutrophils Relative %: 79 %
Platelets: 139 10*3/uL — ABNORMAL LOW (ref 150–400)
RBC: 3.89 MIL/uL — ABNORMAL LOW (ref 4.22–5.81)
RDW: 13 % (ref 11.5–15.5)
WBC: 7.8 10*3/uL (ref 4.0–10.5)
nRBC: 0 % (ref 0.0–0.2)

## 2020-10-18 LAB — CMP (CANCER CENTER ONLY)
ALT: 14 U/L (ref 0–44)
AST: 17 U/L (ref 15–41)
Albumin: 3.7 g/dL (ref 3.5–5.0)
Alkaline Phosphatase: 54 U/L (ref 38–126)
Anion gap: 8 (ref 5–15)
BUN: 26 mg/dL — ABNORMAL HIGH (ref 8–23)
CO2: 25 mmol/L (ref 22–32)
Calcium: 9.4 mg/dL (ref 8.9–10.3)
Chloride: 105 mmol/L (ref 98–111)
Creatinine: 0.94 mg/dL (ref 0.61–1.24)
GFR, Estimated: >60 mL/min (ref >60)
Glucose, Bld: 86 mg/dL (ref 70–99)
Potassium: 4.6 mmol/L (ref 3.5–5.1)
Sodium: 138 mmol/L (ref 135–145)
Total Bilirubin: 0.5 mg/dL (ref 0.3–1.2)
Total Protein: 6.7 g/dL (ref 6.5–8.1)

## 2020-10-18 MED ORDER — SODIUM CHLORIDE 0.9 % IV SOLN
200.0000 mg | Freq: Once | INTRAVENOUS | Status: AC
  Administered 2020-10-18: 200 mg via INTRAVENOUS
  Filled 2020-10-18: qty 8

## 2020-10-18 MED ORDER — FAMOTIDINE IN NACL 20-0.9 MG/50ML-% IV SOLN
INTRAVENOUS | Status: AC
  Filled 2020-10-18: qty 50

## 2020-10-18 MED ORDER — HEPARIN SOD (PORK) LOCK FLUSH 100 UNIT/ML IV SOLN
500.0000 [IU] | Freq: Once | INTRAVENOUS | Status: AC | PRN
  Administered 2020-10-18: 500 [IU]
  Filled 2020-10-18: qty 5

## 2020-10-18 MED ORDER — SODIUM CHLORIDE 0.9% FLUSH
10.0000 mL | INTRAVENOUS | Status: DC | PRN
  Administered 2020-10-18: 10 mL via INTRAVENOUS
  Filled 2020-10-18: qty 10

## 2020-10-18 MED ORDER — DIPHENHYDRAMINE HCL 50 MG/ML IJ SOLN
25.0000 mg | Freq: Once | INTRAMUSCULAR | Status: AC
  Administered 2020-10-18: 25 mg via INTRAVENOUS

## 2020-10-18 MED ORDER — DIPHENHYDRAMINE HCL 50 MG/ML IJ SOLN
INTRAMUSCULAR | Status: AC
  Filled 2020-10-18: qty 1

## 2020-10-18 MED ORDER — FAMOTIDINE IN NACL 20-0.9 MG/50ML-% IV SOLN
20.0000 mg | Freq: Once | INTRAVENOUS | Status: AC
  Administered 2020-10-18: 20 mg via INTRAVENOUS

## 2020-10-18 MED ORDER — SODIUM CHLORIDE 0.9% FLUSH
10.0000 mL | INTRAVENOUS | Status: DC | PRN
  Administered 2020-10-18: 10 mL
  Filled 2020-10-18: qty 10

## 2020-10-18 MED ORDER — SODIUM CHLORIDE 0.9 % IV SOLN
Freq: Once | INTRAVENOUS | Status: AC
  Filled 2020-10-18: qty 250

## 2020-10-18 NOTE — Patient Instructions (Signed)

## 2020-10-18 NOTE — Patient Instructions (Signed)
[facility] Discharge Instructions for Patients Receiving Chemotherapy  Today you received the following chemotherapy agents: Pembrolizumab Beryle Flock)  To help prevent nausea and vomiting after your treatment, we encourage you to take your nausea medication  as prescribed.    If you develop nausea and vomiting that is not controlled by your nausea medication, call the clinic.   BELOW ARE SYMPTOMS THAT SHOULD BE REPORTED IMMEDIATELY:  *FEVER GREATER THAN 100.5 F  *CHILLS WITH OR WITHOUT FEVER  NAUSEA AND VOMITING THAT IS NOT CONTROLLED WITH YOUR NAUSEA MEDICATION  *UNUSUAL SHORTNESS OF BREATH  *UNUSUAL BRUISING OR BLEEDING  TENDERNESS IN MOUTH AND THROAT WITH OR WITHOUT PRESENCE OF ULCERS  *URINARY PROBLEMS  *BOWEL PROBLEMS  UNUSUAL RASH Items with * indicate a potential emergency and should be followed up as soon as possible.  Feel free to call the clinic should you have any questions or concerns. The clinic phone number is [phone].  Please show the [place] at check-in to the Emergency Department and triage nurse.

## 2020-10-25 DIAGNOSIS — C44519 Basal cell carcinoma of skin of other part of trunk: Secondary | ICD-10-CM | POA: Diagnosis not present

## 2020-10-25 DIAGNOSIS — L821 Other seborrheic keratosis: Secondary | ICD-10-CM | POA: Diagnosis not present

## 2020-10-25 DIAGNOSIS — Z85828 Personal history of other malignant neoplasm of skin: Secondary | ICD-10-CM | POA: Diagnosis not present

## 2020-10-25 DIAGNOSIS — D485 Neoplasm of uncertain behavior of skin: Secondary | ICD-10-CM | POA: Diagnosis not present

## 2020-10-25 DIAGNOSIS — L570 Actinic keratosis: Secondary | ICD-10-CM | POA: Diagnosis not present

## 2020-10-25 DIAGNOSIS — L720 Epidermal cyst: Secondary | ICD-10-CM | POA: Diagnosis not present

## 2020-10-25 DIAGNOSIS — D1801 Hemangioma of skin and subcutaneous tissue: Secondary | ICD-10-CM | POA: Diagnosis not present

## 2020-11-06 ENCOUNTER — Encounter (HOSPITAL_COMMUNITY)
Admission: RE | Admit: 2020-11-06 | Discharge: 2020-11-06 | Disposition: A | Discharge status: Home / Self Care | Payer: No Typology Code available for payment source | Source: Ambulatory Visit | Attending: Hematology | Admitting: Hematology

## 2020-11-06 ENCOUNTER — Other Ambulatory Visit: Payer: Self-pay

## 2020-11-06 DIAGNOSIS — C3492 Malignant neoplasm of unspecified part of left bronchus or lung: Secondary | ICD-10-CM | POA: Diagnosis not present

## 2020-11-06 LAB — GLUCOSE, CAPILLARY: Glucose-Capillary: 103 mg/dL — ABNORMAL HIGH (ref 70–99)

## 2020-11-06 MED ORDER — FLUDEOXYGLUCOSE F - 18 (FDG) INJECTION: 6.0000 | Freq: Once | INTRAVENOUS | Status: DC | PRN
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
## 2020-11-07 ENCOUNTER — Inpatient Hospital Stay (HOSPITAL_BASED_OUTPATIENT_CLINIC_OR_DEPARTMENT_OTHER): Payer: Medicare Other | Admitting: Hematology

## 2020-11-07 ENCOUNTER — Other Ambulatory Visit: Payer: Self-pay

## 2020-11-07 ENCOUNTER — Inpatient Hospital Stay: Payer: Medicare Other

## 2020-11-07 ENCOUNTER — Inpatient Hospital Stay: Payer: Medicare Other | Attending: Hematology

## 2020-11-07 VITALS — BP 98/57 | HR 71 | Temp 97.3°F | Resp 14 | Ht 70.0 in | Wt 122.5 lb

## 2020-11-07 DIAGNOSIS — I252 Old myocardial infarction: Secondary | ICD-10-CM | POA: Diagnosis not present

## 2020-11-07 DIAGNOSIS — Z833 Family history of diabetes mellitus: Secondary | ICD-10-CM | POA: Insufficient documentation

## 2020-11-07 DIAGNOSIS — Z79899 Other long term (current) drug therapy: Secondary | ICD-10-CM | POA: Insufficient documentation

## 2020-11-07 DIAGNOSIS — Z7189 Other specified counseling: Secondary | ICD-10-CM

## 2020-11-07 DIAGNOSIS — I10 Essential (primary) hypertension: Secondary | ICD-10-CM | POA: Diagnosis not present

## 2020-11-07 DIAGNOSIS — C7951 Secondary malignant neoplasm of bone: Secondary | ICD-10-CM | POA: Insufficient documentation

## 2020-11-07 DIAGNOSIS — C3412 Malignant neoplasm of upper lobe, left bronchus or lung: Secondary | ICD-10-CM | POA: Insufficient documentation

## 2020-11-07 DIAGNOSIS — G893 Neoplasm related pain (acute) (chronic): Secondary | ICD-10-CM | POA: Diagnosis not present

## 2020-11-07 DIAGNOSIS — N402 Nodular prostate without lower urinary tract symptoms: Secondary | ICD-10-CM | POA: Diagnosis not present

## 2020-11-07 DIAGNOSIS — F17210 Nicotine dependence, cigarettes, uncomplicated: Secondary | ICD-10-CM | POA: Diagnosis not present

## 2020-11-07 DIAGNOSIS — Z8551 Personal history of malignant neoplasm of bladder: Secondary | ICD-10-CM | POA: Diagnosis not present

## 2020-11-07 DIAGNOSIS — Z85858 Personal history of malignant neoplasm of other endocrine glands: Secondary | ICD-10-CM | POA: Diagnosis not present

## 2020-11-07 DIAGNOSIS — C3492 Malignant neoplasm of unspecified part of left bronchus or lung: Secondary | ICD-10-CM

## 2020-11-07 DIAGNOSIS — Z801 Family history of malignant neoplasm of trachea, bronchus and lung: Secondary | ICD-10-CM | POA: Diagnosis not present

## 2020-11-07 DIAGNOSIS — Z5112 Encounter for antineoplastic immunotherapy: Secondary | ICD-10-CM | POA: Diagnosis not present

## 2020-11-07 DIAGNOSIS — Z8249 Family history of ischemic heart disease and other diseases of the circulatory system: Secondary | ICD-10-CM | POA: Insufficient documentation

## 2020-11-07 LAB — CMP (CANCER CENTER ONLY)
ALT: 24 U/L (ref 0–44)
AST: 26 U/L (ref 15–41)
Albumin: 3.6 g/dL (ref 3.5–5.0)
Alkaline Phosphatase: 48 U/L (ref 38–126)
Anion gap: 8 (ref 5–15)
BUN: 22 mg/dL (ref 8–23)
CO2: 25 mmol/L (ref 22–32)
Calcium: 9.3 mg/dL (ref 8.9–10.3)
Chloride: 105 mmol/L (ref 98–111)
Creatinine: 0.88 mg/dL (ref 0.61–1.24)
GFR, Estimated: >60 mL/min (ref >60)
Glucose, Bld: 83 mg/dL (ref 70–99)
Potassium: 4.5 mmol/L (ref 3.5–5.1)
Sodium: 138 mmol/L (ref 135–145)
Total Bilirubin: 0.6 mg/dL (ref 0.3–1.2)
Total Protein: 6.5 g/dL (ref 6.5–8.1)

## 2020-11-07 LAB — CBC WITH DIFFERENTIAL/PLATELET
Abs Immature Granulocytes: 0.02 10*3/uL (ref 0.00–0.07)
Basophils Absolute: 0 10*3/uL (ref 0.0–0.1)
Basophils Relative: 0 %
Eosinophils Absolute: 0.2 10*3/uL (ref 0.0–0.5)
Eosinophils Relative: 2 %
HCT: 36 % — ABNORMAL LOW (ref 39.0–52.0)
Hemoglobin: 12.1 g/dL — ABNORMAL LOW (ref 13.0–17.0)
Immature Granulocytes: 0 %
Lymphocytes Relative: 7 %
Lymphs Abs: 0.6 10*3/uL — ABNORMAL LOW (ref 0.7–4.0)
MCH: 32.7 pg (ref 26.0–34.0)
MCHC: 33.6 g/dL (ref 30.0–36.0)
MCV: 97.3 fL (ref 80.0–100.0)
Monocytes Absolute: 0.7 10*3/uL (ref 0.1–1.0)
Monocytes Relative: 9 %
Neutro Abs: 6.3 10*3/uL (ref 1.7–7.7)
Neutrophils Relative %: 82 %
Platelets: 138 10*3/uL — ABNORMAL LOW (ref 150–400)
RBC: 3.7 MIL/uL — ABNORMAL LOW (ref 4.22–5.81)
RDW: 13.2 % (ref 11.5–15.5)
WBC: 7.8 10*3/uL (ref 4.0–10.5)
nRBC: 0 % (ref 0.0–0.2)

## 2020-11-07 MED ORDER — FAMOTIDINE IN NACL 20-0.9 MG/50ML-% IV SOLN
20.0000 mg | Freq: Once | INTRAVENOUS | Status: AC
  Administered 2020-11-07: 20 mg via INTRAVENOUS

## 2020-11-07 MED ORDER — DIPHENHYDRAMINE HCL 50 MG/ML IJ SOLN
25.0000 mg | Freq: Once | INTRAMUSCULAR | Status: AC
  Administered 2020-11-07: 25 mg via INTRAVENOUS

## 2020-11-07 MED ORDER — DENOSUMAB 120 MG/1.7ML ~~LOC~~ SOLN
SUBCUTANEOUS | Status: AC
  Filled 2020-11-07: qty 1.7

## 2020-11-07 MED ORDER — SODIUM CHLORIDE 0.9 % IV SOLN
200.0000 mg | Freq: Once | INTRAVENOUS | Status: AC
  Administered 2020-11-07: 200 mg via INTRAVENOUS
  Filled 2020-11-07: qty 8

## 2020-11-07 MED ORDER — FAMOTIDINE IN NACL 20-0.9 MG/50ML-% IV SOLN
INTRAVENOUS | Status: AC
  Filled 2020-11-07: qty 50

## 2020-11-07 MED ORDER — SODIUM CHLORIDE 0.9 % IV SOLN
Freq: Once | INTRAVENOUS | Status: AC
  Filled 2020-11-07: qty 250

## 2020-11-07 MED ORDER — SODIUM CHLORIDE 0.9% FLUSH
10.0000 mL | INTRAVENOUS | Status: DC | PRN
  Administered 2020-11-07: 10 mL
  Filled 2020-11-07: qty 10

## 2020-11-07 MED ORDER — SODIUM CHLORIDE 0.9% FLUSH
10.0000 mL | INTRAVENOUS | Status: DC | PRN
  Filled 2020-11-07: qty 10

## 2020-11-07 MED ORDER — DIPHENHYDRAMINE HCL 50 MG/ML IJ SOLN
INTRAMUSCULAR | Status: AC
  Filled 2020-11-07: qty 1

## 2020-11-07 MED ORDER — DENOSUMAB 120 MG/1.7ML ~~LOC~~ SOLN
120.0000 mg | Freq: Once | SUBCUTANEOUS | Status: AC
Start: 2020-11-07 — End: 2020-11-07
  Administered 2020-11-07: 120 mg via SUBCUTANEOUS

## 2020-11-07 MED ORDER — HEPARIN SOD (PORK) LOCK FLUSH 100 UNIT/ML IV SOLN
500.0000 [IU] | Freq: Once | INTRAVENOUS | Status: DC | PRN
  Filled 2020-11-07: qty 5

## 2020-11-07 NOTE — Patient Instructions (Signed)
Implanted Port Insertion, Care After This sheet gives you information about how to care for yourself after your procedure. Your health care provider may also give you more specific instructions. If you have problems or questions, contact your health care provider. What can I expect after the procedure? After the procedure, it is common to have:  Discomfort at the port insertion site.  Bruising on the skin over the port. This should improve over 3-4 days. Follow these instructions at home: Port care  After your port is placed, you will get a manufacturer's information card. The card has information about your port. Keep this card with you at all times.  Take care of the port as told by your health care provider. Ask your health care provider if you or a family member can get training for taking care of the port at home. A home health care nurse may also take care of the port.  Make sure to remember what type of port you have. Incision care  Follow instructions from your health care provider about how to take care of your port insertion site. Make sure you: ? Wash your hands with soap and water before and after you change your bandage (dressing). If soap and water are not available, use hand sanitizer. ? Change your dressing as told by your health care provider. ? Leave stitches (sutures), skin glue, or adhesive strips in place. These skin closures may need to stay in place for 2 weeks or longer. If adhesive strip edges start to loosen and curl up, you may trim the loose edges. Do not remove adhesive strips completely unless your health care provider tells you to do that.  Check your port insertion site every day for signs of infection. Check for: ? Redness, swelling, or pain. ? Fluid or blood. ? Warmth. ? Pus or a bad smell.      Activity  Return to your normal activities as told by your health care provider. Ask your health care provider what activities are safe for you.  Do not  lift anything that is heavier than 10 lb (4.5 kg), or the limit that you are told, until your health care provider says that it is safe. General instructions  Take over-the-counter and prescription medicines only as told by your health care provider.  Do not take baths, swim, or use a hot tub until your health care provider approves. Ask your health care provider if you may take showers. You may only be allowed to take sponge baths.  Do not drive for 24 hours if you were given a sedative during your procedure.  Wear a medical alert bracelet in case of an emergency. This will tell any health care providers that you have a port.  Keep all follow-up visits as told by your health care provider. This is important. Contact a health care provider if:  You cannot flush your port with saline as directed, or you cannot draw blood from the port.  You have a fever or chills.  You have redness, swelling, or pain around your port insertion site.  You have fluid or blood coming from your port insertion site.  Your port insertion site feels warm to the touch.  You have pus or a bad smell coming from the port insertion site. Get help right away if:  You have chest pain or shortness of breath.  You have bleeding from your port that you cannot control. Summary  Take care of the port as told by your   health care provider. Keep the manufacturer's information card with you at all times.  Change your dressing as told by your health care provider.  Contact a health care provider if you have a fever or chills or if you have redness, swelling, or pain around your port insertion site.  Keep all follow-up visits as told by your health care provider. This information is not intended to replace advice given to you by your health care provider. Make sure you discuss any questions you have with your health care provider. Document Revised: 03/23/2018 Document Reviewed: 03/23/2018 Elsevier Patient Education   2021 [employer]  

## 2020-11-07 NOTE — Patient Instructions (Signed)
[facility] Discharge Instructions for Patients Receiving Chemotherapy  Today you received the following chemotherapy agents: Pembrolizumab Beryle Flock)  To help prevent nausea and vomiting after your treatment, we encourage you to take your nausea medication  as prescribed.    If you develop nausea and vomiting that is not controlled by your nausea medication, call the clinic.   BELOW ARE SYMPTOMS THAT SHOULD BE REPORTED IMMEDIATELY:  *FEVER GREATER THAN 100.5 F  *CHILLS WITH OR WITHOUT FEVER  NAUSEA AND VOMITING THAT IS NOT CONTROLLED WITH YOUR NAUSEA MEDICATION  *UNUSUAL SHORTNESS OF BREATH  *UNUSUAL BRUISING OR BLEEDING  TENDERNESS IN MOUTH AND THROAT WITH OR WITHOUT PRESENCE OF ULCERS  *URINARY PROBLEMS  *BOWEL PROBLEMS  UNUSUAL RASH Items with * indicate a potential emergency and should be followed up as soon as possible.  Feel free to call the clinic should you have any questions or concerns. The clinic phone number is [phone].  Please show the [place] at check-in to the Emergency Department and triage nurse.

## 2020-11-07 NOTE — Progress Notes (Signed)
Pt discharged in no apparent distress. Pt left ambulatory without assistance. Pt aware of discharge instructions and verbalized understanding and had no further questions.  

## 2020-11-08 MED ORDER — FENTANYL 50 MCG/HR TD PT72: 1.0000 | MEDICATED_PATCH | TRANSDERMAL | 0 refills | Status: DC

## 2020-11-08 MED ORDER — ONDANSETRON HCL 8 MG PO TABS: 8.0000 mg | ORAL_TABLET | Freq: Three times a day (TID) | ORAL | 2 refills | Status: DC | PRN

## 2020-11-08 MED ORDER — MORPHINE SULFATE 15 MG PO TABS: 15.0000 mg | ORAL_TABLET | ORAL | 0 refills | Status: DC | PRN
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
## 2020-11-13 DIAGNOSIS — C44519 Basal cell carcinoma of skin of other part of trunk: Secondary | ICD-10-CM | POA: Diagnosis not present
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
## 2020-11-28 ENCOUNTER — Inpatient Hospital Stay: Payer: Medicare Other

## 2020-11-28 ENCOUNTER — Inpatient Hospital Stay: Payer: Medicare Other | Admitting: Hematology

## 2020-11-28 ENCOUNTER — Other Ambulatory Visit: Payer: Self-pay

## 2020-11-28 DIAGNOSIS — Z85858 Personal history of malignant neoplasm of other endocrine glands: Secondary | ICD-10-CM | POA: Diagnosis not present

## 2020-11-28 DIAGNOSIS — Z7189 Other specified counseling: Secondary | ICD-10-CM

## 2020-11-28 DIAGNOSIS — Z5112 Encounter for antineoplastic immunotherapy: Secondary | ICD-10-CM | POA: Diagnosis not present

## 2020-11-28 DIAGNOSIS — C3492 Malignant neoplasm of unspecified part of left bronchus or lung: Secondary | ICD-10-CM

## 2020-11-28 DIAGNOSIS — C7951 Secondary malignant neoplasm of bone: Secondary | ICD-10-CM

## 2020-11-28 DIAGNOSIS — C3412 Malignant neoplasm of upper lobe, left bronchus or lung: Secondary | ICD-10-CM

## 2020-11-28 DIAGNOSIS — G893 Neoplasm related pain (acute) (chronic): Secondary | ICD-10-CM | POA: Diagnosis not present

## 2020-11-28 DIAGNOSIS — Z8551 Personal history of malignant neoplasm of bladder: Secondary | ICD-10-CM | POA: Diagnosis not present

## 2020-11-28 LAB — CBC WITH DIFFERENTIAL/PLATELET
Abs Immature Granulocytes: 0.01 10*3/uL (ref 0.00–0.07)
Basophils Absolute: 0 10*3/uL (ref 0.0–0.1)
Basophils Relative: 0 %
Eosinophils Absolute: 0.2 10*3/uL (ref 0.0–0.5)
Eosinophils Relative: 3 %
HCT: 36.5 % — ABNORMAL LOW (ref 39.0–52.0)
Hemoglobin: 12.2 g/dL — ABNORMAL LOW (ref 13.0–17.0)
Immature Granulocytes: 0 %
Lymphocytes Relative: 8 %
Lymphs Abs: 0.6 10*3/uL — ABNORMAL LOW (ref 0.7–4.0)
MCH: 32.6 pg (ref 26.0–34.0)
MCHC: 33.4 g/dL (ref 30.0–36.0)
MCV: 97.6 fL (ref 80.0–100.0)
Monocytes Absolute: 0.6 10*3/uL (ref 0.1–1.0)
Monocytes Relative: 9 %
Neutro Abs: 5.7 10*3/uL (ref 1.7–7.7)
Neutrophils Relative %: 80 %
Platelets: 125 10*3/uL — ABNORMAL LOW (ref 150–400)
RBC: 3.74 MIL/uL — ABNORMAL LOW (ref 4.22–5.81)
RDW: 13.4 % (ref 11.5–15.5)
WBC: 7.2 10*3/uL (ref 4.0–10.5)
nRBC: 0 % (ref 0.0–0.2)

## 2020-11-28 LAB — CMP (CANCER CENTER ONLY)
ALT: 28 U/L (ref 0–44)
AST: 30 U/L (ref 15–41)
Albumin: 3.7 g/dL (ref 3.5–5.0)
Alkaline Phosphatase: 54 U/L (ref 38–126)
Anion gap: 8 (ref 5–15)
BUN: 26 mg/dL — ABNORMAL HIGH (ref 8–23)
CO2: 27 mmol/L (ref 22–32)
Calcium: 9.4 mg/dL (ref 8.9–10.3)
Chloride: 105 mmol/L (ref 98–111)
Creatinine: 0.89 mg/dL (ref 0.61–1.24)
GFR, Estimated: >60 mL/min (ref >60)
Glucose, Bld: 95 mg/dL (ref 70–99)
Potassium: 4.6 mmol/L (ref 3.5–5.1)
Sodium: 140 mmol/L (ref 135–145)
Total Bilirubin: 0.5 mg/dL (ref 0.3–1.2)
Total Protein: 6.6 g/dL (ref 6.5–8.1)

## 2020-11-28 MED ORDER — FAMOTIDINE IN NACL 20-0.9 MG/50ML-% IV SOLN
INTRAVENOUS | Status: AC
  Filled 2020-11-28: qty 50

## 2020-11-28 MED ORDER — FAMOTIDINE IN NACL 20-0.9 MG/50ML-% IV SOLN
20.0000 mg | Freq: Once | INTRAVENOUS | Status: AC
  Administered 2020-11-28: 20 mg via INTRAVENOUS

## 2020-11-28 MED ORDER — SODIUM CHLORIDE 0.9% FLUSH
10.0000 mL | INTRAVENOUS | Status: DC | PRN
  Administered 2020-11-28: 10 mL
  Filled 2020-11-28: qty 10

## 2020-11-28 MED ORDER — DIPHENHYDRAMINE HCL 50 MG/ML IJ SOLN
INTRAMUSCULAR | Status: AC
  Filled 2020-11-28: qty 1

## 2020-11-28 MED ORDER — DIPHENHYDRAMINE HCL 50 MG/ML IJ SOLN
25.0000 mg | Freq: Once | INTRAMUSCULAR | Status: AC
  Administered 2020-11-28: 25 mg via INTRAVENOUS

## 2020-11-28 MED ORDER — HEPARIN SOD (PORK) LOCK FLUSH 100 UNIT/ML IV SOLN
500.0000 [IU] | Freq: Once | INTRAVENOUS | Status: AC | PRN
  Administered 2020-11-28: 500 [IU]
  Filled 2020-11-28: qty 5

## 2020-11-28 MED ORDER — SODIUM CHLORIDE 0.9 % IV SOLN
200.0000 mg | Freq: Once | INTRAVENOUS | Status: AC
  Administered 2020-11-28: 200 mg via INTRAVENOUS
  Filled 2020-11-28: qty 8

## 2020-11-28 MED ORDER — SODIUM CHLORIDE 0.9 % IV SOLN
Freq: Once | INTRAVENOUS | Status: AC
  Filled 2020-11-28: qty 250

## 2020-11-28 NOTE — Patient Instructions (Addendum)
[facility] Discharge Instructions for Patients Receiving Chemotherapy  Today you received the following chemotherapy agents: Pembrolizumab Beryle Flock).  To help prevent nausea and vomiting after your treatment, we encourage you to take your nausea medication  as prescribed.    If you develop nausea and vomiting that is not controlled by your nausea medication, call the clinic.   BELOW ARE SYMPTOMS THAT SHOULD BE REPORTED IMMEDIATELY:  *FEVER GREATER THAN 100.5 F  *CHILLS WITH OR WITHOUT FEVER  NAUSEA AND VOMITING THAT IS NOT CONTROLLED WITH YOUR NAUSEA MEDICATION  *UNUSUAL SHORTNESS OF BREATH  *UNUSUAL BRUISING OR BLEEDING  TENDERNESS IN MOUTH AND THROAT WITH OR WITHOUT PRESENCE OF ULCERS  *URINARY PROBLEMS  *BOWEL PROBLEMS  UNUSUAL RASH Items with * indicate a potential emergency and should be followed up as soon as possible.  Feel free to call the clinic should you have any questions or concerns. The clinic phone number is [phone].  Please show the [place] at check-in to the Emergency Department and triage nurse.

## 2020-11-28 NOTE — Patient Instructions (Signed)

## 2020-11-29 ENCOUNTER — Telehealth: Payer: Self-pay | Admitting: Hematology

## 2020-11-29 NOTE — Telephone Encounter (Signed)
Checked out appointment. No LOS notes needing to be scheduled. No changes made. 

## 2020-12-04 NOTE — Progress Notes (Signed)
This encounter was created in error - please disregard.

## 2020-12-07 DIAGNOSIS — Z8551 Personal history of malignant neoplasm of bladder: Secondary | ICD-10-CM | POA: Diagnosis not present

## 2020-12-07 DIAGNOSIS — N402 Nodular prostate without lower urinary tract symptoms: Secondary | ICD-10-CM | POA: Diagnosis not present

## 2020-12-07 DIAGNOSIS — N400 Enlarged prostate without lower urinary tract symptoms: Secondary | ICD-10-CM | POA: Diagnosis not present

## 2020-12-10 ENCOUNTER — Other Ambulatory Visit: Payer: Self-pay | Admitting: *Deleted

## 2020-12-10 DIAGNOSIS — C3492 Malignant neoplasm of unspecified part of left bronchus or lung: Secondary | ICD-10-CM

## 2020-12-10 MED ORDER — MORPHINE SULFATE 15 MG PO TABS: 15.0000 mg | ORAL_TABLET | ORAL | 0 refills | Status: DC | PRN

## 2020-12-10 MED ORDER — FENTANYL 50 MCG/HR TD PT72: 1.0000 | MEDICATED_PATCH | TRANSDERMAL | 0 refills | Status: DC

## 2020-12-10 NOTE — Telephone Encounter (Signed)
Patient's wife called - patient needs refill of Zofran, Fentanyl, and Morphine Zofran refilled 11/08/20 w/additional refills. Notified pharmacy that patient needed refill of Zofran
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
## 2020-12-20 ENCOUNTER — Other Ambulatory Visit: Payer: Self-pay

## 2020-12-20 ENCOUNTER — Inpatient Hospital Stay: Payer: Medicare Other

## 2020-12-20 ENCOUNTER — Inpatient Hospital Stay: Payer: Medicare Other | Attending: Hematology

## 2020-12-20 ENCOUNTER — Inpatient Hospital Stay (HOSPITAL_BASED_OUTPATIENT_CLINIC_OR_DEPARTMENT_OTHER): Payer: Medicare Other | Admitting: Hematology

## 2020-12-20 VITALS — BP 109/69 | HR 70 | Temp 96.5°F | Resp 17 | Ht 70.0 in | Wt 126.5 lb

## 2020-12-20 DIAGNOSIS — Z79899 Other long term (current) drug therapy: Secondary | ICD-10-CM | POA: Diagnosis not present

## 2020-12-20 DIAGNOSIS — C3492 Malignant neoplasm of unspecified part of left bronchus or lung: Secondary | ICD-10-CM

## 2020-12-20 DIAGNOSIS — Z5112 Encounter for antineoplastic immunotherapy: Secondary | ICD-10-CM

## 2020-12-20 DIAGNOSIS — C7951 Secondary malignant neoplasm of bone: Secondary | ICD-10-CM | POA: Diagnosis not present

## 2020-12-20 DIAGNOSIS — G893 Neoplasm related pain (acute) (chronic): Secondary | ICD-10-CM | POA: Insufficient documentation

## 2020-12-20 DIAGNOSIS — F17210 Nicotine dependence, cigarettes, uncomplicated: Secondary | ICD-10-CM | POA: Diagnosis not present

## 2020-12-20 DIAGNOSIS — I10 Essential (primary) hypertension: Secondary | ICD-10-CM | POA: Diagnosis not present

## 2020-12-20 DIAGNOSIS — Z8551 Personal history of malignant neoplasm of bladder: Secondary | ICD-10-CM | POA: Insufficient documentation

## 2020-12-20 DIAGNOSIS — C3412 Malignant neoplasm of upper lobe, left bronchus or lung: Secondary | ICD-10-CM

## 2020-12-20 DIAGNOSIS — Z7189 Other specified counseling: Secondary | ICD-10-CM

## 2020-12-20 LAB — CMP (CANCER CENTER ONLY)
ALT: 22 U/L (ref 0–44)
AST: 32 U/L (ref 15–41)
Albumin: 3.7 g/dL (ref 3.5–5.0)
Alkaline Phosphatase: 59 U/L (ref 38–126)
Anion gap: 11 (ref 5–15)
BUN: 28 mg/dL — ABNORMAL HIGH (ref 8–23)
CO2: 24 mmol/L (ref 22–32)
Calcium: 9.4 mg/dL (ref 8.9–10.3)
Chloride: 103 mmol/L (ref 98–111)
Creatinine: 1.04 mg/dL (ref 0.61–1.24)
GFR, Estimated: >60 mL/min (ref >60)
Glucose, Bld: 98 mg/dL (ref 70–99)
Potassium: 4.5 mmol/L (ref 3.5–5.1)
Sodium: 138 mmol/L (ref 135–145)
Total Bilirubin: 0.4 mg/dL (ref 0.3–1.2)
Total Protein: 6.6 g/dL (ref 6.5–8.1)

## 2020-12-20 LAB — CBC WITH DIFFERENTIAL/PLATELET
Abs Immature Granulocytes: 0.03 10*3/uL (ref 0.00–0.07)
Basophils Absolute: 0 10*3/uL (ref 0.0–0.1)
Basophils Relative: 0 %
Eosinophils Absolute: 0.3 10*3/uL (ref 0.0–0.5)
Eosinophils Relative: 3 %
HCT: 36.5 % — ABNORMAL LOW (ref 39.0–52.0)
Hemoglobin: 12.2 g/dL — ABNORMAL LOW (ref 13.0–17.0)
Immature Granulocytes: 0 %
Lymphocytes Relative: 8 %
Lymphs Abs: 0.7 10*3/uL (ref 0.7–4.0)
MCH: 32.6 pg (ref 26.0–34.0)
MCHC: 33.4 g/dL (ref 30.0–36.0)
MCV: 97.6 fL (ref 80.0–100.0)
Monocytes Absolute: 0.8 10*3/uL (ref 0.1–1.0)
Monocytes Relative: 10 %
Neutro Abs: 6.8 10*3/uL (ref 1.7–7.7)
Neutrophils Relative %: 79 %
Platelets: 147 10*3/uL — ABNORMAL LOW (ref 150–400)
RBC: 3.74 MIL/uL — ABNORMAL LOW (ref 4.22–5.81)
RDW: 13.6 % (ref 11.5–15.5)
WBC: 8.5 10*3/uL (ref 4.0–10.5)
nRBC: 0 % (ref 0.0–0.2)

## 2020-12-20 MED ORDER — FAMOTIDINE IN NACL 20-0.9 MG/50ML-% IV SOLN
20.0000 mg | Freq: Once | INTRAVENOUS | Status: AC
  Administered 2020-12-20: 20 mg via INTRAVENOUS

## 2020-12-20 MED ORDER — FAMOTIDINE IN NACL 20-0.9 MG/50ML-% IV SOLN
INTRAVENOUS | Status: AC
  Filled 2020-12-20: qty 50

## 2020-12-20 MED ORDER — DIPHENHYDRAMINE HCL 50 MG/ML IJ SOLN
25.0000 mg | Freq: Once | INTRAMUSCULAR | Status: AC
  Administered 2020-12-20: 25 mg via INTRAVENOUS

## 2020-12-20 MED ORDER — SODIUM CHLORIDE 0.9 % IV SOLN
Freq: Once | INTRAVENOUS | Status: AC
  Filled 2020-12-20: qty 250

## 2020-12-20 MED ORDER — SODIUM CHLORIDE 0.9 % IV SOLN
200.0000 mg | Freq: Once | INTRAVENOUS | Status: AC
  Administered 2020-12-20: 200 mg via INTRAVENOUS
  Filled 2020-12-20: qty 8

## 2020-12-20 MED ORDER — SODIUM CHLORIDE 0.9% FLUSH
10.0000 mL | INTRAVENOUS | Status: DC | PRN
  Administered 2020-12-20: 10 mL
  Filled 2020-12-20: qty 10

## 2020-12-20 MED ORDER — DENOSUMAB 120 MG/1.7ML ~~LOC~~ SOLN
SUBCUTANEOUS | Status: AC
  Filled 2020-12-20: qty 1.7

## 2020-12-20 MED ORDER — LIDOCAINE-PRILOCAINE 2.5-2.5 % EX CREA: 1.0000 "application " | TOPICAL_CREAM | CUTANEOUS | 3 refills | Status: DC | PRN

## 2020-12-20 MED ORDER — DIPHENHYDRAMINE HCL 50 MG/ML IJ SOLN
INTRAMUSCULAR | Status: AC
  Filled 2020-12-20: qty 1

## 2020-12-20 MED ORDER — HEPARIN SOD (PORK) LOCK FLUSH 100 UNIT/ML IV SOLN
500.0000 [IU] | Freq: Once | INTRAVENOUS | Status: AC | PRN
  Administered 2020-12-20: 500 [IU]
  Filled 2020-12-20: qty 5

## 2020-12-20 MED ORDER — DENOSUMAB 120 MG/1.7ML ~~LOC~~ SOLN
120.0000 mg | Freq: Once | SUBCUTANEOUS | Status: AC
  Administered 2020-12-20: 120 mg via SUBCUTANEOUS

## 2020-12-20 NOTE — Patient Instructions (Signed)

## 2020-12-20 NOTE — Patient Instructions (Signed)
[facility] [place] Discharge Instructions for Patients Receiving Chemotherapy  Today you received the following chemotherapy agents Beryle Flock  To help prevent nausea and vomiting after your treatment, we encourage you to take your nausea medication as directed.   If you develop nausea and vomiting that is not controlled by your nausea medication, call the clinic.   BELOW ARE SYMPTOMS THAT SHOULD BE REPORTED IMMEDIATELY:  *FEVER GREATER THAN 100.5 F  *CHILLS WITH OR WITHOUT FEVER  NAUSEA AND VOMITING THAT IS NOT CONTROLLED WITH YOUR NAUSEA MEDICATION  *UNUSUAL SHORTNESS OF BREATH  *UNUSUAL BRUISING OR BLEEDING  TENDERNESS IN MOUTH AND THROAT WITH OR WITHOUT PRESENCE OF ULCERS  *URINARY PROBLEMS  *BOWEL PROBLEMS  UNUSUAL RASH Items with * indicate a potential emergency and should be followed up as soon as possible.  Feel free to call the clinic should you have any questions or concerns. The clinic phone number is [phone].  Please show the [place] at check-in to the Emergency Department and triage nurse.

## 2020-12-24 ENCOUNTER — Telehealth: Payer: Self-pay | Admitting: Hematology

## 2020-12-24 NOTE — Telephone Encounter (Signed)
Left message with follow-up appointments per 4/14 los. Gave option to call back to reschedule if needed.

## 2020-12-26 ENCOUNTER — Other Ambulatory Visit: Payer: Self-pay

## 2020-12-26 DIAGNOSIS — C3492 Malignant neoplasm of unspecified part of left bronchus or lung: Secondary | ICD-10-CM

## 2020-12-26 MED ORDER — LIDOCAINE-PRILOCAINE 2.5-2.5 % EX CREA: 1.0000 "application " | TOPICAL_CREAM | CUTANEOUS | 3 refills | Status: DC | PRN

## 2020-12-27 ENCOUNTER — Other Ambulatory Visit: Payer: Self-pay | Admitting: Pulmonary Disease

## 2021-01-03 DIAGNOSIS — Z23 Encounter for immunization: Secondary | ICD-10-CM | POA: Diagnosis not present

## 2021-01-09 ENCOUNTER — Inpatient Hospital Stay: Payer: No Typology Code available for payment source

## 2021-01-09 ENCOUNTER — Other Ambulatory Visit: Payer: No Typology Code available for payment source

## 2021-01-09 ENCOUNTER — Other Ambulatory Visit: Payer: Self-pay

## 2021-01-09 ENCOUNTER — Inpatient Hospital Stay: Payer: No Typology Code available for payment source | Attending: Hematology

## 2021-01-09 VITALS — BP 125/58 | HR 53 | Temp 97.6°F | Resp 17

## 2021-01-09 DIAGNOSIS — Z8249 Family history of ischemic heart disease and other diseases of the circulatory system: Secondary | ICD-10-CM | POA: Diagnosis not present

## 2021-01-09 DIAGNOSIS — Z8551 Personal history of malignant neoplasm of bladder: Secondary | ICD-10-CM | POA: Diagnosis not present

## 2021-01-09 DIAGNOSIS — C7951 Secondary malignant neoplasm of bone: Secondary | ICD-10-CM

## 2021-01-09 DIAGNOSIS — Z5112 Encounter for antineoplastic immunotherapy: Secondary | ICD-10-CM | POA: Diagnosis not present

## 2021-01-09 DIAGNOSIS — C3492 Malignant neoplasm of unspecified part of left bronchus or lung: Secondary | ICD-10-CM

## 2021-01-09 DIAGNOSIS — I10 Essential (primary) hypertension: Secondary | ICD-10-CM | POA: Diagnosis not present

## 2021-01-09 DIAGNOSIS — C3412 Malignant neoplasm of upper lobe, left bronchus or lung: Secondary | ICD-10-CM

## 2021-01-09 DIAGNOSIS — Z833 Family history of diabetes mellitus: Secondary | ICD-10-CM | POA: Diagnosis not present

## 2021-01-09 DIAGNOSIS — Z801 Family history of malignant neoplasm of trachea, bronchus and lung: Secondary | ICD-10-CM | POA: Insufficient documentation

## 2021-01-09 DIAGNOSIS — F17210 Nicotine dependence, cigarettes, uncomplicated: Secondary | ICD-10-CM | POA: Diagnosis not present

## 2021-01-09 DIAGNOSIS — Z79899 Other long term (current) drug therapy: Secondary | ICD-10-CM | POA: Diagnosis not present

## 2021-01-09 DIAGNOSIS — Z7189 Other specified counseling: Secondary | ICD-10-CM

## 2021-01-09 LAB — CBC WITH DIFFERENTIAL/PLATELET
Abs Immature Granulocytes: 0.02 10*3/uL (ref 0.00–0.07)
Basophils Absolute: 0.1 10*3/uL (ref 0.0–0.1)
Basophils Relative: 1 %
Eosinophils Absolute: 0.2 10*3/uL (ref 0.0–0.5)
Eosinophils Relative: 2 %
HCT: 38.7 % — ABNORMAL LOW (ref 39.0–52.0)
Hemoglobin: 12.9 g/dL — ABNORMAL LOW (ref 13.0–17.0)
Immature Granulocytes: 0 %
Lymphocytes Relative: 8 %
Lymphs Abs: 0.7 10*3/uL (ref 0.7–4.0)
MCH: 32.9 pg (ref 26.0–34.0)
MCHC: 33.3 g/dL (ref 30.0–36.0)
MCV: 98.7 fL (ref 80.0–100.0)
Monocytes Absolute: 0.7 10*3/uL (ref 0.1–1.0)
Monocytes Relative: 9 %
Neutro Abs: 6.2 10*3/uL (ref 1.7–7.7)
Neutrophils Relative %: 80 %
Platelets: 161 10*3/uL (ref 150–400)
RBC: 3.92 MIL/uL — ABNORMAL LOW (ref 4.22–5.81)
RDW: 13.9 % (ref 11.5–15.5)
WBC: 7.8 10*3/uL (ref 4.0–10.5)
nRBC: 0 % (ref 0.0–0.2)

## 2021-01-09 LAB — CMP (CANCER CENTER ONLY)
ALT: 29 U/L (ref 0–44)
AST: 41 U/L (ref 15–41)
Albumin: 3.8 g/dL (ref 3.5–5.0)
Alkaline Phosphatase: 59 U/L (ref 38–126)
Anion gap: 8 (ref 5–15)
BUN: 29 mg/dL — ABNORMAL HIGH (ref 8–23)
CO2: 26 mmol/L (ref 22–32)
Calcium: 9.5 mg/dL (ref 8.9–10.3)
Chloride: 105 mmol/L (ref 98–111)
Creatinine: 1.07 mg/dL (ref 0.61–1.24)
GFR, Estimated: >60 mL/min (ref >60)
Glucose, Bld: 90 mg/dL (ref 70–99)
Potassium: 4.8 mmol/L (ref 3.5–5.1)
Sodium: 139 mmol/L (ref 135–145)
Total Bilirubin: 0.8 mg/dL (ref 0.3–1.2)
Total Protein: 7 g/dL (ref 6.5–8.1)

## 2021-01-09 MED ORDER — SODIUM CHLORIDE 0.9 % IV SOLN
Freq: Once | INTRAVENOUS | Status: AC
  Filled 2021-01-09: qty 250

## 2021-01-09 MED ORDER — SODIUM CHLORIDE 0.9% FLUSH
10.0000 mL | INTRAVENOUS | Status: DC | PRN
  Administered 2021-01-09: 10 mL
  Filled 2021-01-09: qty 10

## 2021-01-09 MED ORDER — HEPARIN SOD (PORK) LOCK FLUSH 100 UNIT/ML IV SOLN
500.0000 [IU] | Freq: Once | INTRAVENOUS | Status: AC | PRN
  Administered 2021-01-09: 500 [IU]
  Filled 2021-01-09: qty 5

## 2021-01-09 MED ORDER — SODIUM CHLORIDE 0.9 % IV SOLN
200.0000 mg | Freq: Once | INTRAVENOUS | Status: AC
  Administered 2021-01-09: 200 mg via INTRAVENOUS
  Filled 2021-01-09: qty 8

## 2021-01-09 MED ORDER — DIPHENHYDRAMINE HCL 50 MG/ML IJ SOLN
25.0000 mg | Freq: Once | INTRAMUSCULAR | Status: AC
  Administered 2021-01-09: 25 mg via INTRAVENOUS

## 2021-01-09 MED ORDER — DIPHENHYDRAMINE HCL 50 MG/ML IJ SOLN
INTRAMUSCULAR | Status: AC
  Filled 2021-01-09: qty 1

## 2021-01-09 MED ORDER — FAMOTIDINE 20 MG IN NS 100 ML IVPB
20.0000 mg | Freq: Once | INTRAVENOUS | Status: AC
  Administered 2021-01-09: 20 mg via INTRAVENOUS

## 2021-01-09 MED ORDER — FAMOTIDINE 20 MG IN NS 100 ML IVPB
INTRAVENOUS | Status: AC
  Filled 2021-01-09: qty 100

## 2021-01-09 NOTE — Patient Instructions (Signed)
[facility]  Discharge Instructions: ?Thank you for choosing [facility] to provide your oncology and hematology care.  ? ?If you have a lab appointment with the Cancer Center, please go directly to the Cancer Center and check in at the registration area. ?  ?Wear comfortable clothing and clothing appropriate for easy access to any Portacath or PICC line.  ? ?We strive to give you quality time with your provider. You may need to reschedule your appointment if you arrive late (15 or more minutes).  Arriving late affects you and other patients whose appointments are after yours.  Also, if you miss three or more appointments without notifying the office, you may be dismissed from the clinic at the provider?s discretion.    ?  ?For prescription refill requests, have your pharmacy contact our office and allow 72 hours for refills to be completed.   ? ?Today you received the following chemotherapy and/or immunotherapy agents: Keytruda ?  ?To help prevent nausea and vomiting after your treatment, we encourage you to take your nausea medication as directed. ? ?BELOW ARE SYMPTOMS THAT SHOULD BE REPORTED IMMEDIATELY: ?*FEVER GREATER THAN 100.4 F (38 ?C) OR HIGHER ?*CHILLS OR SWEATING ?*NAUSEA AND VOMITING THAT IS NOT CONTROLLED WITH YOUR NAUSEA MEDICATION ?*UNUSUAL SHORTNESS OF BREATH ?*UNUSUAL BRUISING OR BLEEDING ?*URINARY PROBLEMS (pain or burning when urinating, or frequent urination) ?*BOWEL PROBLEMS (unusual diarrhea, constipation, pain near the anus) ?TENDERNESS IN MOUTH AND THROAT WITH OR WITHOUT PRESENCE OF ULCERS (sore throat, sores in mouth, or a toothache) ?UNUSUAL RASH, SWELLING OR PAIN  ?UNUSUAL VAGINAL DISCHARGE OR ITCHING  ? ?Items with * indicate a potential emergency and should be followed up as soon as possible or go to the Emergency Department if any problems should occur. ? ?Please show the CHEMOTHERAPY ALERT CARD or IMMUNOTHERAPY ALERT CARD at check-in to the  Emergency Department and triage nurse. ? ?Should you have questions after your visit or need to cancel or reschedule your appointment, please contact [facility]  Dept: [phone]  and follow the prompts.  Office hours are 8:00 a.m. to 4:30 p.m. Monday - Friday. Please note that voicemails left after 4:00 p.m. may not be returned until the following business day.  We are closed weekends and major holidays. You have access to a nurse at all times for urgent questions. Please call the main number to the clinic Dept: [phone] and follow the prompts. ? ? ?For any non-urgent questions, you may also contact your provider using MyChart. We now offer e-Visits for anyone 18 and older to request care online for non-urgent symptoms. For details visit [email or web address]. ?  ?Also download the MyChart app! Go to the app store, search "MyChart", open the app, select [place], and log in with your MyChart username and password. ? ?Due to Covid, a mask is required upon entering the hospital/clinic. If you do not have a mask, one will be given to you upon arrival. For doctor visits, patients may have 1 support person aged 18 or older with them. For treatment visits, patients cannot have anyone with them due to current Covid guidelines and our immunocompromised population.  ? ?

## 2021-01-14 ENCOUNTER — Other Ambulatory Visit: Payer: Self-pay

## 2021-01-14 DIAGNOSIS — C3492 Malignant neoplasm of unspecified part of left bronchus or lung: Secondary | ICD-10-CM

## 2021-01-14 MED ORDER — MORPHINE SULFATE 15 MG PO TABS: 15.0000 mg | ORAL_TABLET | ORAL | 0 refills | Status: DC | PRN

## 2021-01-14 MED ORDER — FENTANYL 50 MCG/HR TD PT72: 1.0000 | MEDICATED_PATCH | TRANSDERMAL | 0 refills | Status: DC

## 2021-01-14 MED ORDER — ONDANSETRON HCL 8 MG PO TABS: 8.0000 mg | ORAL_TABLET | Freq: Three times a day (TID) | ORAL | 2 refills | Status: DC | PRN
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
## 2021-01-30 ENCOUNTER — Inpatient Hospital Stay (HOSPITAL_BASED_OUTPATIENT_CLINIC_OR_DEPARTMENT_OTHER): Payer: No Typology Code available for payment source | Admitting: Hematology

## 2021-01-30 ENCOUNTER — Inpatient Hospital Stay: Payer: No Typology Code available for payment source

## 2021-01-30 ENCOUNTER — Other Ambulatory Visit: Payer: No Typology Code available for payment source

## 2021-01-30 ENCOUNTER — Other Ambulatory Visit: Payer: Self-pay

## 2021-01-30 VITALS — BP 96/55 | HR 63 | Temp 97.7°F | Resp 18 | Wt 125.6 lb

## 2021-01-30 DIAGNOSIS — Z5112 Encounter for antineoplastic immunotherapy: Secondary | ICD-10-CM | POA: Diagnosis not present

## 2021-01-30 DIAGNOSIS — C3492 Malignant neoplasm of unspecified part of left bronchus or lung: Secondary | ICD-10-CM

## 2021-01-30 DIAGNOSIS — C7951 Secondary malignant neoplasm of bone: Secondary | ICD-10-CM

## 2021-01-30 DIAGNOSIS — C3412 Malignant neoplasm of upper lobe, left bronchus or lung: Secondary | ICD-10-CM

## 2021-01-30 DIAGNOSIS — Z7189 Other specified counseling: Secondary | ICD-10-CM

## 2021-01-30 LAB — CMP (CANCER CENTER ONLY)
ALT: 21 U/L (ref 0–44)
AST: 27 U/L (ref 15–41)
Albumin: 3.4 g/dL — ABNORMAL LOW (ref 3.5–5.0)
Alkaline Phosphatase: 59 U/L (ref 38–126)
Anion gap: 8 (ref 5–15)
BUN: 22 mg/dL (ref 8–23)
CO2: 27 mmol/L (ref 22–32)
Calcium: 9.4 mg/dL (ref 8.9–10.3)
Chloride: 103 mmol/L (ref 98–111)
Creatinine: 0.97 mg/dL (ref 0.61–1.24)
GFR, Estimated: >60 mL/min (ref >60)
Glucose, Bld: 89 mg/dL (ref 70–99)
Potassium: 4.5 mmol/L (ref 3.5–5.1)
Sodium: 138 mmol/L (ref 135–145)
Total Bilirubin: 0.5 mg/dL (ref 0.3–1.2)
Total Protein: 6.5 g/dL (ref 6.5–8.1)

## 2021-01-30 LAB — CBC WITH DIFFERENTIAL/PLATELET
Abs Immature Granulocytes: 0.02 10*3/uL (ref 0.00–0.07)
Basophils Absolute: 0 10*3/uL (ref 0.0–0.1)
Basophils Relative: 0 %
Eosinophils Absolute: 0.4 10*3/uL (ref 0.0–0.5)
Eosinophils Relative: 4 %
HCT: 36.5 % — ABNORMAL LOW (ref 39.0–52.0)
Hemoglobin: 12.3 g/dL — ABNORMAL LOW (ref 13.0–17.0)
Immature Granulocytes: 0 %
Lymphocytes Relative: 6 %
Lymphs Abs: 0.5 10*3/uL — ABNORMAL LOW (ref 0.7–4.0)
MCH: 32.9 pg (ref 26.0–34.0)
MCHC: 33.7 g/dL (ref 30.0–36.0)
MCV: 97.6 fL (ref 80.0–100.0)
Monocytes Absolute: 0.9 10*3/uL (ref 0.1–1.0)
Monocytes Relative: 10 %
Neutro Abs: 6.7 10*3/uL (ref 1.7–7.7)
Neutrophils Relative %: 80 %
Platelets: 150 10*3/uL (ref 150–400)
RBC: 3.74 MIL/uL — ABNORMAL LOW (ref 4.22–5.81)
RDW: 13.6 % (ref 11.5–15.5)
WBC: 8.4 10*3/uL (ref 4.0–10.5)
nRBC: 0 % (ref 0.0–0.2)

## 2021-01-30 MED ORDER — DENOSUMAB 120 MG/1.7ML ~~LOC~~ SOLN
SUBCUTANEOUS | Status: AC
  Filled 2021-01-30: qty 1.7

## 2021-01-30 MED ORDER — SODIUM CHLORIDE 0.9% FLUSH
10.0000 mL | INTRAVENOUS | Status: DC | PRN
  Administered 2021-01-30: 10 mL
  Filled 2021-01-30: qty 10

## 2021-01-30 MED ORDER — DIPHENHYDRAMINE HCL 50 MG/ML IJ SOLN
INTRAMUSCULAR | Status: AC
  Filled 2021-01-30: qty 1

## 2021-01-30 MED ORDER — FAMOTIDINE 20 MG IN NS 100 ML IVPB
20.0000 mg | Freq: Once | INTRAVENOUS | Status: AC
  Administered 2021-01-30: 20 mg via INTRAVENOUS

## 2021-01-30 MED ORDER — SODIUM CHLORIDE 0.9 % IV SOLN
Freq: Once | INTRAVENOUS | Status: AC
  Filled 2021-01-30: qty 250

## 2021-01-30 MED ORDER — MORPHINE SULFATE 15 MG PO TABS: 15.0000 mg | ORAL_TABLET | ORAL | 0 refills | Status: DC | PRN

## 2021-01-30 MED ORDER — FENTANYL 50 MCG/HR TD PT72
1.0000 | MEDICATED_PATCH | TRANSDERMAL | 0 refills | Status: DC
Start: 2021-02-13 — End: 2021-03-12

## 2021-01-30 MED ORDER — DIPHENHYDRAMINE HCL 50 MG/ML IJ SOLN
25.0000 mg | Freq: Once | INTRAMUSCULAR | Status: AC
  Administered 2021-01-30: 25 mg via INTRAVENOUS

## 2021-01-30 MED ORDER — FAMOTIDINE 20 MG IN NS 100 ML IVPB
INTRAVENOUS | Status: AC
  Filled 2021-01-30: qty 100

## 2021-01-30 MED ORDER — PEMBROLIZUMAB CHEMO INJECTION 100 MG/4ML
200.0000 mg | Freq: Once | INTRAVENOUS | Status: AC
  Administered 2021-01-30: 200 mg via INTRAVENOUS
  Filled 2021-01-30: qty 8

## 2021-01-30 MED ORDER — HEPARIN SOD (PORK) LOCK FLUSH 100 UNIT/ML IV SOLN
500.0000 [IU] | Freq: Once | INTRAVENOUS | Status: AC | PRN
  Administered 2021-01-30: 500 [IU]
  Filled 2021-01-30: qty 5

## 2021-01-30 MED ORDER — DENOSUMAB 120 MG/1.7ML ~~LOC~~ SOLN
120.0000 mg | Freq: Once | SUBCUTANEOUS | Status: AC
  Administered 2021-01-30: 120 mg via SUBCUTANEOUS

## 2021-01-30 NOTE — Patient Instructions (Signed)
[facility]COL[facility]  Discharge Instructions: ?Thank you for choosing [facility] to provide your oncology and hematology care.  ? ?If you have a lab appointment with the Cancer Center, please go directly to the Cancer Center and check in at the registration area. ?  ?Wear comfortable clothing and clothing appropriate for easy access to any Portacath or PICC line.  ? ?We strive to give you quality time with your provider. You may need to reschedule your appointment if you arrive late (15 or more minutes).  Arriving late affects you and other patients whose appointments are after yours.  Also, if you miss three or more appointments without notifying the office, you may be dismissed from the clinic at the provider?s discretion.    ?  ?For prescription refill requests, have your pharmacy contact our office and allow 72 hours for refills to be completed.   ? ?Today you received the following chemotherapy and/or immunotherapy agents: Keytruda ?  ?To help prevent nausea and vomiting after your treatment, we encourage you to take your nausea medication as directed. ? ?BELOW ARE SYMPTOMS THAT SHOULD BE REPORTED IMMEDIATELY: ?*FEVER GREATER THAN 100.4 F (38 ?C) OR HIGHER ?*CHILLS OR SWEATING ?*NAUSEA AND VOMITING THAT IS NOT CONTROLLED WITH YOUR NAUSEA MEDICATION ?*UNUSUAL SHORTNESS OF BREATH ?*UNUSUAL BRUISING OR BLEEDING ?*URINARY PROBLEMS (pain or burning when urinating, or frequent urination) ?*BOWEL PROBLEMS (unusual diarrhea, constipation, pain near the anus) ?TENDERNESS IN MOUTH AND THROAT WITH OR WITHOUT PRESENCE OF ULCERS (sore throat, sores in mouth, or a toothache) ?UNUSUAL RASH, SWELLING OR PAIN  ?UNUSUAL VAGINAL DISCHARGE OR ITCHING  ? ?Items with * indicate a potential emergency and should be followed up as soon as possible or go to the Emergency Department if any problems should occur. ? ?Please show the CHEMOTHERAPY ALERT CARD or IMMUNOTHERAPY ALERT CARD at check-in to the  Emergency Department and triage nurse. ? ?Should you have questions after your visit or need to cancel or reschedule your appointment, please contact  [facility] MEDICAL ONCOLOGY  Dept: [phone]  and follow the prompts.  Office hours are 8:00 a.m. to 4:30 p.m. Monday - Friday. Please note that voicemails left after 4:00 p.m. may not be returned until the following business day.  We are closed weekends and major holidays. You have access to a nurse at all times for urgent questions. Please call the main number to the clinic Dept: [phone] and follow the prompts. ? ? ?For any non-urgent questions, you may also contact your provider using MyChart. We now offer e-Visits for anyone 18 and older to request care online for non-urgent symptoms. For details visit [email or web address]. ?  ?Also download the MyChart app! Go to the app store, search "MyChart", open the app, select , and log in with your MyChart username and password. ? ?Due to Covid, a mask is required upon entering the hospital/clinic. If you do not have a mask, one will be given to you upon arrival. For doctor visits, patients may have 1 support person aged 18 or older with them. For treatment visits, patients cannot have anyone with them due to current Covid guidelines and our immunocompromised population.  ? ?

## 2021-01-31 ENCOUNTER — Telehealth: Payer: Self-pay | Admitting: Hematology

## 2021-01-31 NOTE — Telephone Encounter (Signed)
Scheduled follow-up appointments per 5/25 los. Patient's wife is aware. Mailed calendar.

## 2021-02-05 ENCOUNTER — Encounter: Payer: Self-pay | Admitting: Hematology

## 2021-02-06 ENCOUNTER — Telehealth: Payer: Self-pay | Admitting: Adult Health

## 2021-02-06 NOTE — Telephone Encounter (Signed)
I called patient to discuss Evusheld, a long acting monoclonal antibody injection administered to patients with decreased immune systems or intolerance/allergy to the COVID 19 vaccine as COVID19 prevention.    Unable to reach patient.  LMOM to return my call.  Maryl Blalock, NP  

## 2021-02-11 ENCOUNTER — Other Ambulatory Visit: Payer: Self-pay | Admitting: Adult Health

## 2021-02-11 DIAGNOSIS — C7951 Secondary malignant neoplasm of bone: Secondary | ICD-10-CM

## 2021-02-11 NOTE — Progress Notes (Signed)
I connected by phone with Kyle Foster on 02/11/2021, 10:41 AM to discuss the potential use of a new treatment, tixagevimab/cilgavimab, for pre-exposure prophylaxis for prevention of coronavirus disease 2019 (COVID-19) caused by the SARS-CoV-2 virus.  This patient is a 75 y.o. male that meets the FDA criteria for Emergency Use Authorization of tixagevimab/cilgavimab for pre-exposure prophylaxis of COVID-19 disease. Pt meets following criteria:  Age >12 yr and weight > 40kg  Not currently infected with SARS-CoV-2 and has no known recent exposure to an individual infected with SARS-CoV-2 AND o Who has moderate to severe immune compromise due to a medical condition or receipt of immunosuppressive medications or treatments and may not mount an adequate immune response to COVID-19 vaccination or  o Vaccination with any available COVID-19 vaccine, according to the approved or authorized schedule, is not recommended due to a history of severe adverse reaction (e.g., severe allergic reaction) to a COVID-19 vaccine(s) and/or COVID-19 vaccine component(s).  o Patient meets the following definition of mod-severe immune compromised status: 7. Solid tumor malignancies on immunomodulatory chemotherapy or advanced AIDS   I have spoken and communicated the following to the patient or parent/caregiver regarding COVID monoclonal antibody treatment:  1. FDA has authorized the emergency use of tixagevimab/cilgavimab for the pre-exposure prophylaxis of COVID-19 in patients with moderate-severe immunocompromised status, who meet above EUA criteria.  2. The significant known and potential risks and benefits of COVID monoclonal antibody, and the extent to which such potential risks and benefits are unknown.  3. Information on available alternative treatments and the risks and benefits of those alternatives, including clinical trials.  4. The patient or parent/caregiver has the option to accept or refuse COVID monoclonal  antibody treatment.  After reviewing this information with the patient, agree to receive tixagevimab/cilgavimab. He will receive this on 02/20/2021 when he comes into get his Keytruda.  Scot Dock, NP, 02/11/2021, 10:41 AM

## 2021-02-12 ENCOUNTER — Other Ambulatory Visit: Payer: Self-pay

## 2021-02-20 ENCOUNTER — Inpatient Hospital Stay: Payer: No Typology Code available for payment source

## 2021-02-20 ENCOUNTER — Inpatient Hospital Stay: Payer: No Typology Code available for payment source | Attending: Hematology

## 2021-02-20 ENCOUNTER — Other Ambulatory Visit: Payer: No Typology Code available for payment source

## 2021-02-20 ENCOUNTER — Other Ambulatory Visit: Payer: Self-pay

## 2021-02-20 VITALS — BP 97/57 | HR 60 | Temp 98.1°F | Resp 18

## 2021-02-20 DIAGNOSIS — Z8551 Personal history of malignant neoplasm of bladder: Secondary | ICD-10-CM | POA: Diagnosis not present

## 2021-02-20 DIAGNOSIS — C3492 Malignant neoplasm of unspecified part of left bronchus or lung: Secondary | ICD-10-CM

## 2021-02-20 DIAGNOSIS — Z7189 Other specified counseling: Secondary | ICD-10-CM

## 2021-02-20 DIAGNOSIS — Z298 Encounter for other specified prophylactic measures: Secondary | ICD-10-CM | POA: Insufficient documentation

## 2021-02-20 DIAGNOSIS — C7951 Secondary malignant neoplasm of bone: Secondary | ICD-10-CM

## 2021-02-20 DIAGNOSIS — C3412 Malignant neoplasm of upper lobe, left bronchus or lung: Secondary | ICD-10-CM

## 2021-02-20 DIAGNOSIS — Z5112 Encounter for antineoplastic immunotherapy: Secondary | ICD-10-CM | POA: Insufficient documentation

## 2021-02-20 DIAGNOSIS — Z9221 Personal history of antineoplastic chemotherapy: Secondary | ICD-10-CM | POA: Insufficient documentation

## 2021-02-20 LAB — CBC WITH DIFFERENTIAL/PLATELET
Abs Immature Granulocytes: 0.01 10*3/uL (ref 0.00–0.07)
Basophils Absolute: 0 10*3/uL (ref 0.0–0.1)
Basophils Relative: 0 %
Eosinophils Absolute: 0.4 10*3/uL (ref 0.0–0.5)
Eosinophils Relative: 5 %
HCT: 37.6 % — ABNORMAL LOW (ref 39.0–52.0)
Hemoglobin: 12.9 g/dL — ABNORMAL LOW (ref 13.0–17.0)
Immature Granulocytes: 0 %
Lymphocytes Relative: 7 %
Lymphs Abs: 0.6 10*3/uL — ABNORMAL LOW (ref 0.7–4.0)
MCH: 33.3 pg (ref 26.0–34.0)
MCHC: 34.3 g/dL (ref 30.0–36.0)
MCV: 97.2 fL (ref 80.0–100.0)
Monocytes Absolute: 0.8 10*3/uL (ref 0.1–1.0)
Monocytes Relative: 10 %
Neutro Abs: 6.5 10*3/uL (ref 1.7–7.7)
Neutrophils Relative %: 78 %
Platelets: 168 10*3/uL (ref 150–400)
RBC: 3.87 MIL/uL — ABNORMAL LOW (ref 4.22–5.81)
RDW: 13.3 % (ref 11.5–15.5)
WBC: 8.2 10*3/uL (ref 4.0–10.5)
nRBC: 0 % (ref 0.0–0.2)

## 2021-02-20 LAB — CMP (CANCER CENTER ONLY)
ALT: 21 U/L (ref 0–44)
AST: 27 U/L (ref 15–41)
Albumin: 3.4 g/dL — ABNORMAL LOW (ref 3.5–5.0)
Alkaline Phosphatase: 71 U/L (ref 38–126)
Anion gap: 7 (ref 5–15)
BUN: 26 mg/dL — ABNORMAL HIGH (ref 8–23)
CO2: 26 mmol/L (ref 22–32)
Calcium: 9.3 mg/dL (ref 8.9–10.3)
Chloride: 104 mmol/L (ref 98–111)
Creatinine: 0.92 mg/dL (ref 0.61–1.24)
GFR, Estimated: >60 mL/min (ref >60)
Glucose, Bld: 99 mg/dL (ref 70–99)
Potassium: 4.7 mmol/L (ref 3.5–5.1)
Sodium: 137 mmol/L (ref 135–145)
Total Bilirubin: 0.5 mg/dL (ref 0.3–1.2)
Total Protein: 7 g/dL (ref 6.5–8.1)

## 2021-02-20 MED ORDER — FAMOTIDINE 20 MG IN NS 100 ML IVPB
INTRAVENOUS | Status: AC
  Filled 2021-02-20: qty 100

## 2021-02-20 MED ORDER — SODIUM CHLORIDE 0.9% FLUSH
10.0000 mL | INTRAVENOUS | Status: DC | PRN
  Administered 2021-02-20: 10 mL
  Filled 2021-02-20: qty 10

## 2021-02-20 MED ORDER — TIXAGEVIMAB (PART OF EVUSHELD) INJECTION
300.0000 mg | Freq: Once | INTRAMUSCULAR | Status: AC
  Administered 2021-02-20: 300 mg via INTRAMUSCULAR
  Filled 2021-02-20: qty 3

## 2021-02-20 MED ORDER — DIPHENHYDRAMINE HCL 50 MG/ML IJ SOLN
25.0000 mg | Freq: Once | INTRAMUSCULAR | Status: AC
  Administered 2021-02-20: 25 mg via INTRAVENOUS

## 2021-02-20 MED ORDER — HEPARIN SOD (PORK) LOCK FLUSH 100 UNIT/ML IV SOLN
500.0000 [IU] | Freq: Once | INTRAVENOUS | Status: AC | PRN
  Administered 2021-02-20: 500 [IU]
  Filled 2021-02-20: qty 5

## 2021-02-20 MED ORDER — SODIUM CHLORIDE 0.9 % IV SOLN
200.0000 mg | Freq: Once | INTRAVENOUS | Status: AC
  Administered 2021-02-20: 200 mg via INTRAVENOUS
  Filled 2021-02-20: qty 8

## 2021-02-20 MED ORDER — SODIUM CHLORIDE 0.9 % IV SOLN
Freq: Once | INTRAVENOUS | Status: AC
Start: 2021-02-20 — End: 2021-02-20
  Filled 2021-02-20: qty 250

## 2021-02-20 MED ORDER — DIPHENHYDRAMINE HCL 50 MG/ML IJ SOLN
INTRAMUSCULAR | Status: AC
  Filled 2021-02-20: qty 1

## 2021-02-20 MED ORDER — CILGAVIMAB (PART OF EVUSHELD) INJECTION
300.0000 mg | Freq: Once | INTRAMUSCULAR | Status: AC
  Administered 2021-02-20: 300 mg via INTRAMUSCULAR
  Filled 2021-02-20: qty 3

## 2021-02-20 MED ORDER — FAMOTIDINE 20 MG IN NS 100 ML IVPB
20.0000 mg | Freq: Once | INTRAVENOUS | Status: AC
  Administered 2021-02-20: 20 mg via INTRAVENOUS

## 2021-02-20 NOTE — Patient Instructions (Signed)
[facility] ONCOLOG[facility]  Discharge Instructions: Thank you for choosing [facility] to provide your oncology and hematology care.   If you have a lab appointment with the [facility], please go directly to the [facility] and check in at the registration area.   Wear comfortable clothing and clothing appropriate for easy access to any Portacath or PICC line.   We strive to give you quality time with your provider. You may need to reschedule your appointment if you arrive late (15 or more minutes).  Arriving late affects you and other patients whose appointments are after yours.  Also, if you miss three or more appointments without notifying the office, you may be dismissed from the clinic at the provider's discretion.      For prescription refill requests, have your pharmacy contact our office and allow 72 hours for refills to be completed.    Today you received the following chemotherapy and/or immunotherapy agents Beryle Flock      To help prevent nausea and vomiting after your treatment, we encourage you to take your nausea medication as directed.  BELOW ARE SYMPTOMS THAT SHOULD BE REPORTED IMMEDIATELY: *FEVER GREATER THAN 100.4 F (38 C) OR HIGHER *CHILLS OR SWEATING *NAUSEA AND VOMITING THAT IS NOT CONTROLLED WITH YOUR NAUSEA MEDICATION *UNUSUAL SHORTNESS OF BREATH *UNUSUAL BRUISING OR BLEEDING *URINARY PROBLEMS (pain or burning when urinating, or frequent urination) *BOWEL PROBLEMS (unusual diarrhea, constipation, pain near the anus) TENDERNESS IN MOUTH AND THROAT WITH OR WITHOUT PRESENCE OF ULCERS (sore throat, sores in mouth, or a toothache) UNUSUAL RASH, SWELLING OR PAIN  UNUSUAL VAGINAL DISCHARGE OR ITCHING   Items with * indicate a potential emergency and should be followed up as soon as possible or go to the Emergency Department if any problems should occur.  Please show the CHEMOTHERAPY ALERT CARD or IMMUNOTHERAPY ALERT CARD at check-in to  the Emergency Department and triage nurse.  Should you have questions after your visit or need to cancel or reschedule your appointment, please contact [facility]  Dept: [phone]  and follow the prompts.  Office hours are 8:00 a.m. to 4:30 p.m. Monday - Friday. Please note that voicemails left after 4:00 p.m. may not be returned until the following business day.  We are closed weekends and major holidays. You have access to a nurse at all times for urgent questions. Please call the main number to the clinic Dept: [phone] and follow the prompts.   For any non-urgent questions, you may also contact your provider using MyChart. We now offer e-Visits for anyone 42 and older to request care online for non-urgent symptoms. For details visit [email or web address].   Also download the MyChart app! Go to the app store, search "MyChart", open the app, select [facility], and log in with your MyChart username and password.  Due to Covid, a mask is required upon entering the hospital/clinic. If you do not have a mask, one will be given to you upon arrival. For doctor visits, patients may have 1 support person aged 8 or older with them. For treatment visits, patients cannot have anyone with them due to current Covid guidelines and our immunocompromised population.

## 2021-03-12 ENCOUNTER — Other Ambulatory Visit: Payer: Self-pay | Admitting: *Deleted

## 2021-03-12 DIAGNOSIS — C3492 Malignant neoplasm of unspecified part of left bronchus or lung: Secondary | ICD-10-CM

## 2021-03-12 MED ORDER — ONDANSETRON HCL 8 MG PO TABS: 8.0000 mg | ORAL_TABLET | Freq: Three times a day (TID) | ORAL | 2 refills | Status: DC | PRN

## 2021-03-12 MED ORDER — FENTANYL 50 MCG/HR TD PT72: 1.0000 | MEDICATED_PATCH | TRANSDERMAL | 0 refills | Status: DC

## 2021-03-12 MED ORDER — MORPHINE SULFATE 15 MG PO TABS: 15.0000 mg | ORAL_TABLET | ORAL | 0 refills | Status: DC | PRN

## 2021-03-12 NOTE — Progress Notes (Signed)
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 male who has been referred to [place] by Dr. Karie Kirks for evaluation and management of Lung Mass concerning for primary lung cancer.   he pt reports he has been having back pain on and off for about 3 months and was being worked up at the [facility] in [place] by his PCP . He reports it was being maanged as MSK pain and he was referred to PT but the symptoms got progressively worse. He presented to the ED on 10/14/18 with SOB and midsternal chest pain, neck pain, and back pain which had been constant for the last 2-3 weeks.   Of note prior to the patient's visit today, pt has had a CTA Chest completed on 10/14/18 with results revealing 5.6 cm left upper lobe lesion with chest wall and thoracic spine invasion, possible epidural extension of tumor. 2. 2.7 cm left hilar mass occluding the left lower lobe pulmonary artery branch and probably attenuating or occluding the inferior left pulmonary vein. 3. Left lower lobe pulmonary nodules possibly metastatic. 4. Negative for acute PE or thoracic aortic dissection. 5. Coronary and Aortic Atherosclerosis.  Most recent lab results (10/15/18) of CBC is as follows: all values are WNL except for RBC at 3.76, [name], [name], Glucose at 111.  He subsequently had an MRI of the T spine which showed Large cavitary mass arising in the superior aspect of the left hilum and extending into the posteromedial  aspect of the left upper lobe invading the left side of the T5 and T6 vertebra and extending into the spinal canal with a slight mass effect upon the spinal cord. There is a slight pathologic compression fracture of the T6 vertebral body. 2. Probable small metastasis in the T8 vertebral body. 3. Metastatic pulmonary nodules at the left lung base posterolaterally. 4. The mass destroys the posterior aspects of the left fifth and sixth ribs.  Patient has had a h/o localized bladder cancer s/p TURBT in 2009. 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  INTERVAL HISTORY:   Kyle Foster returns today for management and evaluation of his Squamous Cell Carcinoma Lung Cancer and maintenance Pembrolizumab. The patient's last visit with [place] was on 01/30/2021. The pt reports that he is doing well overall.  The pt reports that he has been feeling well with no new changes or symptoms. He notes he has some difficulty sleeping at night, but consistently falls asleep at around 1-2 am and sleeps until 10 am. He continues to stay active. The patient notes no new medication changes.  The patient notes that his hands go numb for a short period of time intermittently. This resolves on its own and is not bothersome.  Lab results today 03/13/2021 of CBC w/diff and CMP is as follows: all values are WNL except for RBC of 3.67, Hgb of 12.2, Hct of 36.0, Lymphs Abs of 0.6K, BUN of 27, Albumin of 3.3.  On review of systems, pt reports intermittent numbness of hand and denies infection issues, changes in breathing, new bone pains, abdominal pain, changes in bowel habits, acute skin rashes, leg swelling, and any other symptoms.   Past Medical History:  Diagnosis Date   Allergy    Bladder cancer ([place]) 10/2008   CAD (coronary artery disease)    Cancer of parotid gland ([place]) 12/2009   "squamous cell cancer attached to it; took the gland out"   Depression    GERD  (gastroesophageal reflux disease)    History of chickenpox    History of kidney stones    Hyperlipidemia    Hypertension    Myocardial infarction ([place]) 06/2008   Recurrent upper respiratory infection (URI)    09/01/11 saw PCP - Kathryne Eriksson , antibiotic  and prednisone    Skin cancer    "cut & burned off arms, hands, face, neck" (06/14/2018)   Squamous cell carcinoma of lung ([place]) dx'd 10/2018   chemo.xrt. immunotherapy    SURGICAL HISTORY: Past Surgical History:  Procedure Laterality Date   CATARACT EXTRACTION W/ INTRAOCULAR LENS IMPLANT Right 12/2007   CORONARY ANGIOPLASTY WITH STENT PLACEMENT  06/2008   "3 stents" (06/14/2018)   CYSTOSCOPY W/ RETROGRADES  09/22/2011   Procedure: CYSTOSCOPY WITH RETROGRADE PYELOGRAM;  Surgeon: Bernestine Amass, MD;  Location: [facility];  Service: Urology;  Laterality: Left;  Cystoscopy left Retrograde Pyelogram      (c-arm)    CYSTOSCOPY WITH BIOPSY  09/22/2011   Procedure: CYSTOSCOPY WITH BIOPSY;  Surgeon: Bernestine Amass, MD;  Location: [facility];  Service: Urology;  Laterality: N/A;   Biopsy   EXCISIONAL HEMORRHOIDECTOMY  ~ 2006   EYE SURGERY Left 04/2011   "reconstruction; gold weight in eye lid " (06/14/2018)   FOOT NEUROMA SURGERY Left    INGUINAL HERNIA REPAIR Right 02/2018   IR IMAGING GUIDED PORT INSERTION  11/23/2018   NM MYOCAR PERF WALL MOTION  07/11/2008   MILD ISCHEMIA IN THE BASL INFERIOR, MID INFERIOR & APICAL INFERIOR REGIONS   SALIVARY GLAND SURGERY Left 04/2011   "squamous cell cancer attached to it; took the gland out"   SKIN CANCER EXCISION     "arms, hands, face, neck" (06/14/2018)   TRANSURETHRAL RESECTION OF BLADDER TUMOR WITH GYRUS (TURBT-GYRUS)  2010    SOCIAL HISTORY: Social History   Socioeconomic History   Marital status: Married    Spouse name: Not on file   Number of children: Not on file   Years of education: Not on file   Highest education level: Not on file  Occupational History   Not on file  Tobacco Use   Smoking  status: Every Day    Packs/day: 0.50    Years: 52.00    Pack years: 26.00    Types: Cigarettes    Start date: 1968   Smokeless tobacco: Never   Tobacco comments:    03/21/19- smoking 10 cigs a day   Vaping Use   Vaping Use: Never used  Substance and Sexual Activity   Alcohol use: Not Currently   Drug use: Not Currently   Sexual activity: Not on file  Other Topics Concern   Not on file  Social History Narrative   Not on file   Social Determinants of Health   Financial Resource Strain: Not on file  Food Insecurity: Not on file  Transportation Needs: Not on file  Physical Activity: Not on file  Stress: Not on file  Social Connections: Not on  file  Intimate Partner Violence: Not on file    FAMILY HISTORY: Family History  Problem Relation Age of Onset   Heart attack Mother 75   Cancer Mother        Lung   Diabetes Mother    Heart disease Mother    Hypertension Mother    Stroke Father 85   Cancer Father        Lung   Brain cancer Brother    Cancer Sister        Melanoma of great Toe   Hyperlipidemia Sister     ALLERGIES:  is allergic to codeine.  MEDICATIONS:  Current Outpatient Medications  Medication Sig Dispense Refill   atorvastatin (LIPITOR) 80 MG tablet TAKE 1 TABLET (80 MG TOTAL) BY MOUTH DAILY. NEEDS APPOINTMENT FOR FUTURE REFILLS 90 tablet 3   calcium carbonate (TUMS - DOSED IN MG ELEMENTAL CALCIUM) 500 MG chewable tablet Chew 1 tablet (200 mg of elemental calcium total) by mouth 3 (three) times daily with meals. (Patient taking differently: Chew 1 tablet by mouth 3 (three) times daily as needed for indigestion.) 30 tablet 3   Cholecalciferol (VITAMIN D) 2000 UNITS tablet Take 2,000 Units by mouth 2 (two) times daily.     clopidogrel (PLAVIX) 75 MG tablet TAKE 1 TABLET (75 MG TOTAL) BY MOUTH DAILY. NEEDS APPOINTMENT FOR FUTURE REFILLS 90 tablet 3   DENTA 5000 PLUS 1.1 % CREA dental cream Place 1 application onto teeth See admin instructions.       dronabinol (MARINOL) 5 MG capsule Take 1 capsule (5 mg total) by mouth 2 (two) times daily before lunch and supper. 60 capsule 0   DULoxetine (CYMBALTA) 30 MG capsule TAKE 1 CAPSULE BY MOUTH EVERY DAY 90 capsule 2   fentaNYL (DURAGESIC) 50 MCG/HR Place 1 patch onto the skin every 3 (three) days. 10 patch 0   fluoruracil (CARAC) 0.5 % cream Fluorouracil 5%+ Calcipotriene 0.005%     Hypromellose (ARTIFICIAL TEARS OP) Place 1 drop into both eyes at bedtime.      lidocaine-prilocaine (EMLA) cream Apply 1 application topically as needed (access port). 30 g 3   LORazepam (ATIVAN) 0.5 MG tablet Take 1 tablet (0.5 mg total) by mouth every 6 (six) hours as needed (Nausea or vomiting). 30 tablet 0   magnesium oxide (MAG-OX) 400 MG tablet Take 1 tablet (400 mg total) by mouth daily. 90 tablet 0   metoprolol tartrate (LOPRESSOR) 25 MG tablet TAKE 1 TABLET (25 MG TOTAL) BY MOUTH 2 (TWO) TIMES DAILY. NEEDS APPOINTMENT FOR FUTURE REFILLS 180 tablet 3   morphine (MSIR) 15 MG tablet Take 1-2 tablets (15-30 mg total) by mouth every 4 (four) hours as needed for moderate pain or severe pain. 120 tablet 0   ondansetron (ZOFRAN) 8 MG tablet Take 1 tablet (8 mg total) by mouth every 8 (eight) hours as needed for nausea or vomiting. 30 tablet 2   pantoprazole (PROTONIX) 40 MG tablet TAKE 1 TABLET BY MOUTH EVERY DAY 90 tablet 3   TRELEGY ELLIPTA 100-62.5-25 MCG/INH AEPB INHALE 1 PUFF BY MOUTH EVERY DAY 60 each 0   vitamin B-12 (CYANOCOBALAMIN) 100 MCG tablet Take 100 mcg by mouth in the morning and at bedtime.     White Petrolatum-Mineral Oil (TEARS AGAIN) OINT Apply to eye.     No current facility-administered medications for this visit.    REVIEW OF SYSTEMS:   10 Point review of Systems was done is negative except as noted above.  PHYSICAL EXAMINATION:   ECOG PERFORMANCE STATUS: 2 - Symptomatic, <50% confined to bed  Vitals:   03/13/21 0925  BP: (!) 102/54  Pulse: (!) 51  Resp: 18  Temp: (!) 97.4 F (36.3 C)   SpO2: 100%    Filed Weights   03/13/21 0925  Weight: 121 lb 11.2 oz (55.2 kg)    .Body mass index is 17.46 kg/m.   NAD. GENERAL:alert, in no acute distress and comfortable SKIN: no acute rashes, no significant lesions EYES: conjunctiva are pink and non-injected, sclera anicteric OROPHARYNX: MMM, no exudates, no oropharyngeal erythema or ulceration NECK: supple, no JVD LYMPH:  no palpable lymphadenopathy in the cervical, axillary or inguinal regions LUNGS: clear to auscultation b/l with normal respiratory effort HEART: regular rate & rhythm ABDOMEN:  normoactive bowel sounds , non tender, not distended. Extremity: no pedal edema PSYCH: alert & oriented x 3 with fluent speech NEURO: no focal motor/sensory deficits  LABORATORY DATA:  I have reviewed the data as listed  . CBC Latest Ref Rng & Units 03/13/2021 02/20/2021 01/30/2021  WBC 4.0 - 10.5 K/uL 6.9 8.2 8.4  Hemoglobin 13.0 - 17.0 g/dL 12.2(L) 12.9(L) 12.3(L)  Hematocrit 39.0 - 52.0 % 36.0(L) 37.6(L) 36.5(L)  Platelets 150 - 400 K/uL 156 168 150    . CMP Latest Ref Rng & Units 02/20/2021 01/30/2021 01/09/2021  Glucose 70 - 99 mg/dL 99 89 90  BUN 8 - 23 mg/dL 26(H) 22 29(H)  Creatinine 0.61 - 1.24 mg/dL 0.92 0.97 1.07  Sodium 135 - 145 mmol/L 137 138 139  Potassium 3.5 - 5.1 mmol/L 4.7 4.5 4.8  Chloride 98 - 111 mmol/L 104 103 105  CO2 22 - 32 mmol/L 26 27 26  Calcium 8.9 - 10.3 mg/dL 9.3 9.4 9.5  Total Protein 6.5 - 8.1 g/dL 7.0 6.5 7.0  Total Bilirubin 0.3 - 1.2 mg/dL 0.5 0.5 0.8  Alkaline Phos 38 - 126 U/L 71 59 59  AST 15 - 41 U/L 27 27 41  ALT 0 - 44 U/L 21 21 29        RADIOGRAPHIC STUDIES: I have personally reviewed the radiological images as listed and agreed with the findings in the report. No results found.   ASSESSMENT & PLAN:   74 y.o. male with  1. 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 Left hilar mass  with LLLpulmonary artery occlusion. 10/19/18 Lung biopsy revealed Squamous Cell carcinoma S/p Palliative RT to LUL lung mass with 30Gy in 10 fractions between 10/22/18 and 11/03/18, due to impending cord compression  10/19/18 PDL-1 status report found 30% PDL-1 tumor proportion  11/06/18 MRI Brain revealed No intracranial metastatic disease identified. 2. Mild chronic microvascular ischemic changes and volume loss of the brain.  11/10/18 PET/CT revealed Locally advanced left lung mass, as detailed previously. 2. Left perihilar direct extension versus nodal metastasis. 3. No findings of hypermetabolic distant metastasis. 4. No findings of extrathoracic hypermetabolic metastasis.  02/09/19 CT Chest and Abdomen revealed "Today's study demonstrates a positive response to therapy with slight regression of the primary lesion in the superior segment of the left lower lobe which again demonstrates direct invasion of the adjacent posteromedial left chest wall and vertebral column, as detailed above. 2. However, there is a new hypovascular lesion in segment 7 of the liver. This is nonspecific and too small to characterize, but given the development compared to prior studies, this is concerning for potential metastasis. This could   be definitively characterized with MRI of the abdomen with and without IV gadolinium if clinically appropriate. 3. 5 mm nonobstructive calculus in the right renal collecting system. 4. Aortic atherosclerosis, in addition to 3 vessel coronary artery disease. Assessment for potential risk factor modification, dietary therapy or pharmacologic therapy may be warranted, if clinically Indicated. 5. There are calcifications of the aortic valve. Echocardiographic correlation for evaluation of potential valvular dysfunction may be warranted if clinically indicated. 6. Additional incidental findings, as above." Discussed that I suspect that the liver finding is likely a cyst, as progression through treatment  would be unexpected. Nevertheless, will monitor this finding over time with future scans.  S/p 5 cycles of Carboplatin AUC of 5, 150mg/m2 Taxol, and Pembrolizumab with G-CSF support completed on 02/16/19.  03/08/19 ECHO which revealed LV EF of 55-60%  05/30/2019 CT Chest w/o contrast ([id number]) which revealed "1. Stable post treatment related changes of prior radiation therapy in the left lung. Chronic destructive changes in T5 and T6 vertebrae as well as the posterior aspects of the left fifth and sixth ribs, similar to the prior study. 2. Diffuse bronchial wall thickening with mild to moderate centrilobular and paraseptal emphysema. 3. Aortic atherosclerosis, in addition to three-vessel coronary artery disease. Please note that although the presence of coronary artery calcium documents the presence of coronary artery disease, the severity of this disease and any potential stenosis cannot be assessed on this non-gated CT examination. Assessment for potential risk factor modification, dietary therapy or pharmacologic therapy may be warranted, if clinically indicated. 4. There are calcifications of the aortic valve and mitral valve/annulus. Echocardiographic correlation for evaluation of potential valvular dysfunction may be warranted if clinically indicated. 5. 6 mm nonobstructive calculus in the upper pole collecting system of the right kidney. 6. Additional incidental findings, as above."  09/28/2019 CT Chest ([id number]) revealed stable appearance of main mass, some radiation changes in the lung, no new lung nodules, no new bone changes 2. 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 Smoker 4. 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  Has regularly followed up with ENT Dr. James Browne 6. Cancer related  pain   PLAN: -Discussed pt labwork today, 03/13/2021; blood counts stable, chemistries stable. -Advised pt that his last scans were in March. We will repeat these in around one month. -Advised pt the numbness is most likely carpal tunnel. Can get a brace/splint for hand when not in use. Keep hands in extended position. -No lab, clinical, or radiographic evidence of Squamous Cell Carcinoma Lung Cancer progression at this time. -The pt has no prohibitive toxicities from continuing his Pembrolizumab immunotherapy.  -Continue Xgeva q6weeks. -Will get CT Chest in 5 weeks. -Will see back in 6 weeks with labs.    FOLLOW UP: -Plz schedule next 4 doses of maintenance Pembrolizumab with portflush and  labs -MD visit with every other treatment. Next visit in 6 weeks -Xgeva q6week with every other Pembrolizumab treatment -CT chest in 5 weeks     The total time spent in the appointment was 25 minutes and more than 50% was on counseling and direct patient cares.    All of the patient's questions were answered with apparent satisfaction. The patient knows to call the clinic with any problems, questions or concerns.   Gautam Kale MD MS AAHIVMS SCH   [facility] Hematology/Oncology Physician [facility]  (Office):       [phone] (Work cell):  [phone] (Fax):           [phone]  03/13/2021 9:46 AM   I, Reinaldo Raddle, am acting as scribe for Dr. Sullivan Lone, MD.   .I have reviewed the above documentation for accuracy and completeness, and I agree with the above. Brunetta Genera MD

## 2021-03-12 NOTE — Telephone Encounter (Signed)
Patient's wife called. Requested refills for Zofran, Morphine and Fentanyl for patient.  Zofran refilled. Refill request for Morphine and Fentanyl routed to Dr. [name]

## 2021-03-13 ENCOUNTER — Other Ambulatory Visit: Payer: No Typology Code available for payment source

## 2021-03-13 ENCOUNTER — Inpatient Hospital Stay (HOSPITAL_BASED_OUTPATIENT_CLINIC_OR_DEPARTMENT_OTHER): Payer: No Typology Code available for payment source | Admitting: Hematology

## 2021-03-13 ENCOUNTER — Inpatient Hospital Stay: Payer: No Typology Code available for payment source | Attending: Hematology

## 2021-03-13 ENCOUNTER — Other Ambulatory Visit: Payer: Self-pay

## 2021-03-13 ENCOUNTER — Inpatient Hospital Stay: Payer: No Typology Code available for payment source

## 2021-03-13 VITALS — BP 102/54 | HR 51 | Temp 97.4°F | Resp 18 | Ht 70.0 in | Wt 121.7 lb

## 2021-03-13 DIAGNOSIS — C3492 Malignant neoplasm of unspecified part of left bronchus or lung: Secondary | ICD-10-CM | POA: Diagnosis not present

## 2021-03-13 DIAGNOSIS — Z7189 Other specified counseling: Secondary | ICD-10-CM

## 2021-03-13 DIAGNOSIS — Z8551 Personal history of malignant neoplasm of bladder: Secondary | ICD-10-CM | POA: Insufficient documentation

## 2021-03-13 DIAGNOSIS — Z5112 Encounter for antineoplastic immunotherapy: Secondary | ICD-10-CM | POA: Diagnosis not present

## 2021-03-13 DIAGNOSIS — Z85858 Personal history of malignant neoplasm of other endocrine glands: Secondary | ICD-10-CM | POA: Insufficient documentation

## 2021-03-13 DIAGNOSIS — C3412 Malignant neoplasm of upper lobe, left bronchus or lung: Secondary | ICD-10-CM | POA: Diagnosis present

## 2021-03-13 DIAGNOSIS — Z79899 Other long term (current) drug therapy: Secondary | ICD-10-CM | POA: Insufficient documentation

## 2021-03-13 DIAGNOSIS — C7951 Secondary malignant neoplasm of bone: Secondary | ICD-10-CM

## 2021-03-13 DIAGNOSIS — G893 Neoplasm related pain (acute) (chronic): Secondary | ICD-10-CM | POA: Insufficient documentation

## 2021-03-13 DIAGNOSIS — F17210 Nicotine dependence, cigarettes, uncomplicated: Secondary | ICD-10-CM | POA: Insufficient documentation

## 2021-03-13 LAB — CMP (CANCER CENTER ONLY)
ALT: 31 U/L (ref 0–44)
AST: 35 U/L (ref 15–41)
Albumin: 3.3 g/dL — ABNORMAL LOW (ref 3.5–5.0)
Alkaline Phosphatase: 68 U/L (ref 38–126)
Anion gap: 6 (ref 5–15)
BUN: 27 mg/dL — ABNORMAL HIGH (ref 8–23)
CO2: 28 mmol/L (ref 22–32)
Calcium: 9.2 mg/dL (ref 8.9–10.3)
Chloride: 106 mmol/L (ref 98–111)
Creatinine: 0.99 mg/dL (ref 0.61–1.24)
GFR, Estimated: >60 mL/min (ref >60)
Glucose, Bld: 96 mg/dL (ref 70–99)
Potassium: 4.2 mmol/L (ref 3.5–5.1)
Sodium: 140 mmol/L (ref 135–145)
Total Bilirubin: 0.3 mg/dL (ref 0.3–1.2)
Total Protein: 6.7 g/dL (ref 6.5–8.1)

## 2021-03-13 LAB — CBC WITH DIFFERENTIAL/PLATELET
Abs Immature Granulocytes: 0.02 10*3/uL (ref 0.00–0.07)
Basophils Absolute: 0 10*3/uL (ref 0.0–0.1)
Basophils Relative: 1 %
Eosinophils Absolute: 0.4 10*3/uL (ref 0.0–0.5)
Eosinophils Relative: 5 %
HCT: 36 % — ABNORMAL LOW (ref 39.0–52.0)
Hemoglobin: 12.2 g/dL — ABNORMAL LOW (ref 13.0–17.0)
Immature Granulocytes: 0 %
Lymphocytes Relative: 8 %
Lymphs Abs: 0.6 10*3/uL — ABNORMAL LOW (ref 0.7–4.0)
MCH: 33.2 pg (ref 26.0–34.0)
MCHC: 33.9 g/dL (ref 30.0–36.0)
MCV: 98.1 fL (ref 80.0–100.0)
Monocytes Absolute: 0.7 10*3/uL (ref 0.1–1.0)
Monocytes Relative: 10 %
Neutro Abs: 5.2 10*3/uL (ref 1.7–7.7)
Neutrophils Relative %: 76 %
Platelets: 156 10*3/uL (ref 150–400)
RBC: 3.67 MIL/uL — ABNORMAL LOW (ref 4.22–5.81)
RDW: 13.8 % (ref 11.5–15.5)
WBC: 6.9 10*3/uL (ref 4.0–10.5)
nRBC: 0 % (ref 0.0–0.2)

## 2021-03-13 MED ORDER — DIPHENHYDRAMINE HCL 50 MG/ML IJ SOLN
INTRAMUSCULAR | Status: AC
  Filled 2021-03-13: qty 1

## 2021-03-13 MED ORDER — DENOSUMAB 120 MG/1.7ML ~~LOC~~ SOLN
SUBCUTANEOUS | Status: AC
  Filled 2021-03-13: qty 1.7

## 2021-03-13 MED ORDER — SODIUM CHLORIDE 0.9% FLUSH
10.0000 mL | INTRAVENOUS | Status: DC | PRN
  Administered 2021-03-13: 10 mL
  Filled 2021-03-13: qty 10

## 2021-03-13 MED ORDER — SODIUM CHLORIDE 0.9 % IV SOLN
200.0000 mg | Freq: Once | INTRAVENOUS | Status: AC
  Administered 2021-03-13: 200 mg via INTRAVENOUS
  Filled 2021-03-13: qty 8

## 2021-03-13 MED ORDER — SODIUM CHLORIDE 0.9 % IV SOLN
Freq: Once | INTRAVENOUS | Status: AC
  Filled 2021-03-13: qty 250

## 2021-03-13 MED ORDER — FAMOTIDINE 20 MG IN NS 100 ML IVPB
INTRAVENOUS | Status: AC
  Filled 2021-03-13: qty 100

## 2021-03-13 MED ORDER — SODIUM CHLORIDE 0.9% FLUSH
10.0000 mL | INTRAVENOUS | Status: DC | PRN
Start: 2021-03-13 — End: 2021-03-13
  Administered 2021-03-13: 10 mL
  Filled 2021-03-13: qty 10

## 2021-03-13 MED ORDER — FAMOTIDINE 20 MG IN NS 100 ML IVPB
20.0000 mg | Freq: Once | INTRAVENOUS | Status: AC
  Administered 2021-03-13: 20 mg via INTRAVENOUS

## 2021-03-13 MED ORDER — DENOSUMAB 120 MG/1.7ML ~~LOC~~ SOLN
120.0000 mg | Freq: Once | SUBCUTANEOUS | Status: AC
  Administered 2021-03-13: 120 mg via SUBCUTANEOUS

## 2021-03-13 MED ORDER — DIPHENHYDRAMINE HCL 50 MG/ML IJ SOLN
25.0000 mg | Freq: Once | INTRAMUSCULAR | Status: AC
  Administered 2021-03-13: 25 mg via INTRAVENOUS

## 2021-03-13 MED ORDER — HEPARIN SOD (PORK) LOCK FLUSH 100 UNIT/ML IV SOLN
500.0000 [IU] | Freq: Once | INTRAVENOUS | Status: AC | PRN
  Administered 2021-03-13: 500 [IU]
  Filled 2021-03-13: qty 5

## 2021-03-13 NOTE — Patient Instructions (Signed)
[facility]  Discharge Instructions: ?Thank you for choosing [facility] to provide your oncology and hematology care.  ? ?If you have a lab appointment with the Cancer Center, please go directly to the Cancer Center and check in at the registration area. ?  ?Wear comfortable clothing and clothing appropriate for easy access to any Portacath or PICC line.  ? ?We strive to give you quality time with your provider. You may need to reschedule your appointment if you arrive late (15 or more minutes).  Arriving late affects you and other patients whose appointments are after yours.  Also, if you miss three or more appointments without notifying the office, you may be dismissed from the clinic at the provider?s discretion.    ?  ?For prescription refill requests, have your pharmacy contact our office and allow 72 hours for refills to be completed.   ? ?Today you received the following chemotherapy and/or immunotherapy agents: Keytruda ?  ?To help prevent nausea and vomiting after your treatment, we encourage you to take your nausea medication as directed. ? ?BELOW ARE SYMPTOMS THAT SHOULD BE REPORTED IMMEDIATELY: ?*FEVER GREATER THAN 100.4 F (38 ?C) OR HIGHER ?*CHILLS OR SWEATING ?*NAUSEA AND VOMITING THAT IS NOT CONTROLLED WITH YOUR NAUSEA MEDICATION ?*UNUSUAL SHORTNESS OF BREATH ?*UNUSUAL BRUISING OR BLEEDING ?*URINARY PROBLEMS (pain or burning when urinating, or frequent urination) ?*BOWEL PROBLEMS (unusual diarrhea, constipation, pain near the anus) ?TENDERNESS IN MOUTH AND THROAT WITH OR WITHOUT PRESENCE OF ULCERS (sore throat, sores in mouth, or a toothache) ?UNUSUAL RASH, SWELLING OR PAIN  ?UNUSUAL VAGINAL DISCHARGE OR ITCHING  ? ?Items with * indicate a potential emergency and should be followed up as soon as possible or go to the Emergency Department if any problems should occur. ? ?Please show the CHEMOTHERAPY ALERT CARD or IMMUNOTHERAPY ALERT CARD at check-in to the  Emergency Department and triage nurse. ? ?Should you have questions after your visit or need to cancel or reschedule your appointment, please contact [facility]  Dept: [phone]  and follow the prompts.  Office hours are 8:00 a.m. to 4:30 p.m. Monday - Friday. Please note that voicemails left after 4:00 p.m. may not be returned until the following business day.  We are closed weekends and major holidays. You have access to a nurse at all times for urgent questions. Please call the main number to the clinic Dept: [phone] and follow the prompts. ? ? ?For any non-urgent questions, you may also contact your provider using MyChart. We now offer e-Visits for anyone 18 and older to request care online for non-urgent symptoms. For details visit [email or web address]. ?  ?Also download the MyChart app! Go to the app store, search "MyChart", open the app, select [facility], and log in with your MyChart username and password. ? ?Due to Covid, a mask is required upon entering the hospital/clinic. If you do not have a mask, one will be given to you upon arrival. For doctor visits, patients may have 1 support person aged 18 or older with them. For treatment visits, patients cannot have anyone with them due to current Covid guidelines and our immunocompromised population.  ? ?

## 2021-03-13 NOTE — Patient Instructions (Signed)

## 2021-03-14 ENCOUNTER — Telehealth: Payer: Self-pay | Admitting: Hematology

## 2021-03-14 NOTE — Telephone Encounter (Signed)
Scheduled follow-up appointments per 7/6 los. Patient's wife is aware.

## 2021-03-18 DIAGNOSIS — Z8551 Personal history of malignant neoplasm of bladder: Secondary | ICD-10-CM | POA: Diagnosis not present

## 2021-03-19 ENCOUNTER — Encounter: Payer: Self-pay | Admitting: Hematology

## 2021-03-22 ENCOUNTER — Encounter: Payer: Self-pay | Admitting: Registered Nurse

## 2021-03-22 ENCOUNTER — Other Ambulatory Visit: Payer: Self-pay

## 2021-03-22 ENCOUNTER — Ambulatory Visit (INDEPENDENT_AMBULATORY_CARE_PROVIDER_SITE_OTHER): Payer: Medicare Other | Admitting: Registered Nurse

## 2021-03-22 VITALS — BP 105/63 | HR 78 | Temp 98.5°F | Resp 18 | Ht 70.0 in | Wt 121.8 lb

## 2021-03-22 DIAGNOSIS — C7951 Secondary malignant neoplasm of bone: Secondary | ICD-10-CM | POA: Diagnosis not present

## 2021-03-22 DIAGNOSIS — I452 Bifascicular block: Secondary | ICD-10-CM | POA: Diagnosis not present

## 2021-03-22 DIAGNOSIS — I10 Essential (primary) hypertension: Secondary | ICD-10-CM

## 2021-03-22 DIAGNOSIS — Z77098 Contact with and (suspected) exposure to other hazardous, chiefly nonmedicinal, chemicals: Secondary | ICD-10-CM | POA: Diagnosis not present

## 2021-03-22 DIAGNOSIS — C3492 Malignant neoplasm of unspecified part of left bronchus or lung: Secondary | ICD-10-CM

## 2021-03-22 DIAGNOSIS — Z7689 Persons encountering health services in other specified circumstances: Secondary | ICD-10-CM | POA: Diagnosis not present

## 2021-03-22 NOTE — Patient Instructions (Addendum)
[name] -   Doristine Devoid to meet you. I admire your tenacity through what must be a trying course of these cancers and the other medical history you've endured.  As you know - nothing major today - check in every 6 months or so and we can fill in the gaps that specialists haven't taken care of.  [place] or LPN will take care of Medicare Annual Wellness visits to satisfy those requirements.   Thank you  Kyle Foster     If you have lab work done today you will be contacted with your lab results within the next 2 weeks.  If you have not heard from [place] then please contact us. The fastest way to get your results is to register for My Chart.   IF you received an x-ray today, you will receive an invoice from [facility]. Please contact [facility] at [phone] with questions or concerns regarding your invoice.   IF you received labwork today, you will receive an invoice from [facility]. Please contact [facility] at [phone] with questions or concerns regarding your invoice.   Our billing staff will not be able to assist you with questions regarding bills from these companies.  You will be contacted with the lab results as soon as they are available. The fastest way to get your results is to activate your My Chart account. Instructions are located on the last page of this paperwork. If you have not heard from [place] regarding the results in 2 weeks, please contact this office.

## 2021-03-27 ENCOUNTER — Other Ambulatory Visit: Payer: Self-pay | Admitting: Urology

## 2021-03-27 ENCOUNTER — Telehealth: Payer: Self-pay | Admitting: Cardiovascular Disease

## 2021-03-27 NOTE — Telephone Encounter (Signed)
   [facility] Pre-operative Risk Assessment    Request for surgical clearance:  What type of surgery is being performed?  Cystoscopy Bladder Biopsy & Retrograde Pyelogram  When is this surgery scheduled?  04/19/21   What type of clearance is required (medical clearance vs. Pharmacy clearance to hold med vs. Both)?  Both   Are there any medications that need to be held prior to surgery and how long? Plavix, 5 days prior  Practice name and name of physician performing surgery?  [facility]  Dr. Collier Flowers   What is your office phone number? [phone] (ext#: [phone])    7.   What is your office fax number? [phone]  8.   Anesthesia type (None, local, MAC, general) ?  General   Zara Council 03/27/2021, 4:12 PM  _________________________________________________________________   (provider comments below)

## 2021-03-28 NOTE — Telephone Encounter (Addendum)
   Name: TEIGAN SAHLI  DOB: [birth year]  MRN: [id number]  Primary Cardiologist: Quay Burow, MD  Chart reviewed as part of pre-operative protocol coverage.  Pt has a history of CAD status post non-ST-segment elevation myocardial infarction June 30, 2008. They catheterized him and stented his AV-groove circumflex and marginal branch bifurcation with Driver bare-metal stents. He did have tandem 70% lesions in his RCA and normal LV function. He continues to smoke 5 cigarettes a day. He also has hypertension and hyperlipidemia.  02/2019 echo with normal LVSF.  When seen by his primary cardiologist, Dr. 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  He has had radiation therapy, chemotherapy, and was on immunotherapy as of his 05/08/2020 visit.  He continued to smoke 5 to 10 cigarettes a day.  Because of Ansar Skoda Caicedo's complex past medical history including recent dz of stage IV lung CA (squamous cell) and time since last visit by the time of his procedure, recommend a follow-up visit in order to better assess preoperative cardiovascular risk.  Pre-op covering staff: - Please schedule appointment and call patient to inform them. Please add "pre-op clearance" to the appointment notes so provider is aware. - Please contact requesting surgeon's office via preferred method (i.e, phone, fax) to inform them of need for appointment prior to surgery.  Dr. Gwenlyn Found: Please advise regarding his Plavix and holding this before the procedure, if acceptable.  Will remove from preop pool at this time given need for appt.  Arvil Chaco, PA-C  03/28/2021, 12:22 PM

## 2021-03-28 NOTE — Telephone Encounter (Signed)
Pt has been scheduled to see Laurann Montana, NP, 04/12/2021 at 11:00.   Will route back to the requesting surgeon's office to make them aware clearance will be addressed at that time.

## 2021-03-29 NOTE — Progress Notes (Signed)
Sent message, via epic in basket, requesting orders in epic from surgeon.  

## 2021-04-02 ENCOUNTER — Other Ambulatory Visit: Payer: Self-pay | Admitting: Hematology

## 2021-04-03 ENCOUNTER — Inpatient Hospital Stay: Payer: No Typology Code available for payment source

## 2021-04-03 ENCOUNTER — Other Ambulatory Visit: Payer: Self-pay

## 2021-04-03 VITALS — BP 107/61 | HR 66 | Temp 98.1°F | Resp 18 | Wt 122.8 lb

## 2021-04-03 DIAGNOSIS — Z5112 Encounter for antineoplastic immunotherapy: Secondary | ICD-10-CM | POA: Diagnosis not present

## 2021-04-03 DIAGNOSIS — Z7189 Other specified counseling: Secondary | ICD-10-CM

## 2021-04-03 DIAGNOSIS — C3492 Malignant neoplasm of unspecified part of left bronchus or lung: Secondary | ICD-10-CM

## 2021-04-03 DIAGNOSIS — C3412 Malignant neoplasm of upper lobe, left bronchus or lung: Secondary | ICD-10-CM

## 2021-04-03 DIAGNOSIS — C7951 Secondary malignant neoplasm of bone: Secondary | ICD-10-CM

## 2021-04-03 LAB — CBC WITH DIFFERENTIAL/PLATELET
Abs Immature Granulocytes: 0.03 10*3/uL (ref 0.00–0.07)
Basophils Absolute: 0 10*3/uL (ref 0.0–0.1)
Basophils Relative: 0 %
Eosinophils Absolute: 0.2 10*3/uL (ref 0.0–0.5)
Eosinophils Relative: 3 %
HCT: 36.2 % — ABNORMAL LOW (ref 39.0–52.0)
Hemoglobin: 12.4 g/dL — ABNORMAL LOW (ref 13.0–17.0)
Immature Granulocytes: 0 %
Lymphocytes Relative: 7 %
Lymphs Abs: 0.6 10*3/uL — ABNORMAL LOW (ref 0.7–4.0)
MCH: 33.1 pg (ref 26.0–34.0)
MCHC: 34.3 g/dL (ref 30.0–36.0)
MCV: 96.5 fL (ref 80.0–100.0)
Monocytes Absolute: 0.7 10*3/uL (ref 0.1–1.0)
Monocytes Relative: 9 %
Neutro Abs: 6.6 10*3/uL (ref 1.7–7.7)
Neutrophils Relative %: 81 %
Platelets: 149 10*3/uL — ABNORMAL LOW (ref 150–400)
RBC: 3.75 MIL/uL — ABNORMAL LOW (ref 4.22–5.81)
RDW: 13.8 % (ref 11.5–15.5)
WBC: 8.2 10*3/uL (ref 4.0–10.5)
nRBC: 0 % (ref 0.0–0.2)

## 2021-04-03 LAB — CMP (CANCER CENTER ONLY)
ALT: 20 U/L (ref 0–44)
AST: 25 U/L (ref 15–41)
Albumin: 3.5 g/dL (ref 3.5–5.0)
Alkaline Phosphatase: 57 U/L (ref 38–126)
Anion gap: 8 (ref 5–15)
BUN: 23 mg/dL (ref 8–23)
CO2: 27 mmol/L (ref 22–32)
Calcium: 9.4 mg/dL (ref 8.9–10.3)
Chloride: 104 mmol/L (ref 98–111)
Creatinine: 0.94 mg/dL (ref 0.61–1.24)
GFR, Estimated: >60 mL/min (ref >60)
Glucose, Bld: 93 mg/dL (ref 70–99)
Potassium: 4.3 mmol/L (ref 3.5–5.1)
Sodium: 139 mmol/L (ref 135–145)
Total Bilirubin: 0.7 mg/dL (ref 0.3–1.2)
Total Protein: 6.8 g/dL (ref 6.5–8.1)

## 2021-04-03 MED ORDER — SODIUM CHLORIDE 0.9 % IV SOLN
Freq: Once | INTRAVENOUS | Status: AC
  Filled 2021-04-03: qty 250

## 2021-04-03 MED ORDER — SODIUM CHLORIDE 0.9% FLUSH
10.0000 mL | INTRAVENOUS | Status: DC | PRN
  Administered 2021-04-03: 10 mL
  Filled 2021-04-03: qty 10

## 2021-04-03 MED ORDER — HEPARIN SOD (PORK) LOCK FLUSH 100 UNIT/ML IV SOLN
500.0000 [IU] | Freq: Once | INTRAVENOUS | Status: AC | PRN
  Administered 2021-04-03: 500 [IU]
  Filled 2021-04-03: qty 5

## 2021-04-03 MED ORDER — DIPHENHYDRAMINE HCL 50 MG/ML IJ SOLN
INTRAMUSCULAR | Status: AC
  Filled 2021-04-03: qty 1

## 2021-04-03 MED ORDER — SODIUM CHLORIDE 0.9 % IV SOLN
200.0000 mg | Freq: Once | INTRAVENOUS | Status: AC
  Administered 2021-04-03: 200 mg via INTRAVENOUS
  Filled 2021-04-03: qty 8

## 2021-04-03 MED ORDER — DIPHENHYDRAMINE HCL 25 MG PO CAPS
ORAL_CAPSULE | ORAL | Status: AC
  Filled 2021-04-03: qty 1

## 2021-04-03 MED ORDER — FAMOTIDINE 20 MG IN NS 100 ML IVPB
INTRAVENOUS | Status: AC
  Filled 2021-04-03: qty 100

## 2021-04-03 MED ORDER — DIPHENHYDRAMINE HCL 50 MG/ML IJ SOLN
25.0000 mg | Freq: Once | INTRAMUSCULAR | Status: AC
  Administered 2021-04-03: 25 mg via INTRAVENOUS

## 2021-04-03 MED ORDER — FAMOTIDINE 20 MG IN NS 100 ML IVPB
20.0000 mg | Freq: Once | INTRAVENOUS | Status: AC
  Administered 2021-04-03: 20 mg via INTRAVENOUS

## 2021-04-03 NOTE — Patient Instructions (Signed)
[place] [facility]  Discharge Instructions: Thank you for choosing [facility] to provide your oncology and hematology care.   If you have a lab appointment with the [facility], please go directly to the [facility] and check in at the registration area.   Wear comfortable clothing and clothing appropriate for easy access to any Portacath or PICC line.   We strive to give you quality time with your provider. You may need to reschedule your appointment if you arrive late (15 or more minutes).  Arriving late affects you and other patients whose appointments are after yours.  Also, if you miss three or more appointments without notifying the office, you may be dismissed from the clinic at the provider's discretion.      For prescription refill requests, have your pharmacy contact our office and allow 72 hours for refills to be completed.    Today you received the following chemotherapy and/or immunotherapy agents Pembrolizumab (Keytruda).      To help prevent nausea and vomiting after your treatment, we encourage you to take your nausea medication as directed.  BELOW ARE SYMPTOMS THAT SHOULD BE REPORTED IMMEDIATELY: *FEVER GREATER THAN 100.4 F (38 C) OR HIGHER *CHILLS OR SWEATING *NAUSEA AND VOMITING THAT IS NOT CONTROLLED WITH YOUR NAUSEA MEDICATION *UNUSUAL SHORTNESS OF BREATH *UNUSUAL BRUISING OR BLEEDING *URINARY PROBLEMS (pain or burning when urinating, or frequent urination) *BOWEL PROBLEMS (unusual diarrhea, constipation, pain near the anus) TENDERNESS IN MOUTH AND THROAT WITH OR WITHOUT PRESENCE OF ULCERS (sore throat, sores in mouth, or a toothache) UNUSUAL RASH, SWELLING OR PAIN  UNUSUAL VAGINAL DISCHARGE OR ITCHING   Items with * indicate a potential emergency and should be followed up as soon as possible or go to the Emergency Department if any problems should occur.  Please show the CHEMOTHERAPY ALERT CARD or IMMUNOTHERAPY ALERT CARD  at check-in to the Emergency Department and triage nurse.  Should you have questions after your visit or need to cancel or reschedule your appointment, please contact [facility]  Dept: [phone]  and follow the prompts.  Office hours are 8:00 a.m. to 4:30 p.m. Monday - Friday. Please note that voicemails left after 4:00 p.m. may not be returned until the following business day.  We are closed weekends and major holidays. You have access to a nurse at all times for urgent questions. Please call the main number to the clinic Dept: [phone] and follow the prompts.   For any non-urgent questions, you may also contact your provider using MyChart. We now offer e-Visits for anyone 11 and older to request care online for non-urgent symptoms. For details visit [email or web address].   Also download the MyChart app! Go to the app store, search "MyChart", open the app, select [facility], and log in with your MyChart username and password.  Due to Covid, a mask is required upon entering the hospital/clinic. If you do not have a mask, one will be given to you upon arrival. For doctor visits, patients may have 1 support person aged 51 or older with them. For treatment visits, patients cannot have anyone with them due to current Covid guidelines and our immunocompromised population.

## 2021-04-10 NOTE — Progress Notes (Signed)
DUE TO COVID-19 ONLY ONE VISITOR IS ALLOWED TO COME WITH YOU AND STAY IN THE WAITING ROOM ONLY DURING PRE OP AND PROCEDURE DAY OF SURGERY. THE 1 VISITOR  MAY VISIT WITH YOU AFTER SURGERY IN YOUR PRIVATE ROOM DURING VISITING HOURS ONLY!  YOU NEED TO HAVE A COVID 19 TEST ON_______ @_______ , THIS TEST MUST BE DONE BEFORE SURGERY,  COVID TESTING SITE [place] [address], IT IS ON THE RIGHT GOING OUT [address] APPROXIMATELY  2 MINUTES PAST [employer] ON THE RIGHT. ONCE YOUR COVID TEST IS COMPLETED,  PLEASE BEGIN THE QUARANTINE INSTRUCTIONS AS OUTLINED IN YOUR HANDOUT.                Kyle Foster  04/10/2021   Your procedure is scheduled on:       04/19/2021   Report to [facility] Main  Entrance   Report to admitting at   1115 AM     Call this number if you have problems the morning of surgery [phone]    Remember: Do not eat food , candy gum or mints :After Midnight. You may have clear liquids from midnight until  1015am    CLEAR LIQUID DIET   Foods Allowed                                                                       Coffee and tea, regular and decaf                              Plain Jell-O any favor except red or purple                                            Fruit ices (not with fruit pulp)                                      Iced Popsicles                                     Carbonated beverages, regular and diet                                    Cranberry, grape and apple juices Sports drinks like Gatorade Lightly seasoned clear broth or consume(fat free) Sugar, honey syrup   _____________________________________________________________________    BRUSH YOUR TEETH MORNING OF SURGERY AND RINSE YOUR MOUTH OUT, NO CHEWING GUM CANDY OR MINTS.     Take these medicines the morning of surgery with A SIP OF WATER: Cymbalta, metoprolol, magnesium, protonix, inhalers as usual and bring,   DO NOT TAKE ANY DIABETIC  MEDICATIONS DAY OF YOUR SURGERY  You may not have any metal on your body including hair pins and              piercings  Do not wear jewelry, make-up, lotions, powders or perfumes, deodorant             Do not wear nail polish on your fingernails.  Do not shave  48 hours prior to surgery.              Men may shave face and neck.   Do not bring valuables to the hospital. [place].  Contacts, dentures or bridgework may not be worn into surgery.  Leave suitcase in the car. After surgery it may be brought to your room.     Patients discharged the day of surgery will not be allowed to drive home. IF YOU ARE HAVING SURGERY AND GOING HOME THE SAME DAY, YOU MUST HAVE AN ADULT TO DRIVE YOU HOME AND BE WITH YOU FOR 24 HOURS. YOU MAY GO HOME BY TAXI OR UBER OR ORTHERWISE, BUT AN ADULT MUST ACCOMPANY YOU HOME AND STAY WITH YOU FOR 24 HOURS.  Name and phone number of your driver:  Special Instructions: N/A              Please read over the following fact sheets you were given: _____________________________________________________________________  [facility] - Preparing for Surgery Before surgery, you can play an important role.  Because skin is not sterile, your skin needs to be as free of germs as possible.  You can reduce the number of germs on your skin by washing with CHG (chlorahexidine gluconate) soap before surgery.  CHG is an antiseptic cleaner which kills germs and bonds with the skin to continue killing germs even after washing. Please DO NOT use if you have an allergy to CHG or antibacterial soaps.  If your skin becomes reddened/irritated stop using the CHG and inform your nurse when you arrive at Short Stay. Do not shave (including legs and underarms) for at least 48 hours prior to the first CHG shower.  You may shave your face/neck. Please follow these instructions carefully:  1.  Shower with CHG Soap the night  before surgery and the  morning of Surgery.  2.  If you choose to wash your hair, wash your hair first as usual with your  normal  shampoo.  3.  After you shampoo, rinse your hair and body thoroughly to remove the  shampoo.                           4.  Use CHG as you would any other liquid soap.  You can apply chg directly  to the skin and wash                       Gently with a scrungie or clean washcloth.  5.  Apply the CHG Soap to your body ONLY FROM THE NECK DOWN.   Do not use on face/ open                           Wound or open sores. Avoid contact with eyes, ears mouth and genitals (private parts).  Wash face,  Genitals (private parts) with your normal soap.             6.  Wash thoroughly, paying special attention to the area where your surgery  will be performed.  7.  Thoroughly rinse your body with warm water from the neck down.  8.  DO NOT shower/wash with your normal soap after using and rinsing off  the CHG Soap.                9.  Pat yourself dry with a clean towel.            10.  Wear clean pajamas.            11.  Place clean sheets on your bed the night of your first shower and do not  sleep with pets. Day of Surgery : Do not apply any lotions/deodorants the morning of surgery.  Please wear clean clothes to the hospital/surgery center.  FAILURE TO FOLLOW THESE INSTRUCTIONS MAY RESULT IN THE CANCELLATION OF YOUR SURGERY PATIENT SIGNATURE_________________________________  NURSE SIGNATURE__________________________________  ________________________________________________________________________

## 2021-04-12 ENCOUNTER — Ambulatory Visit (INDEPENDENT_AMBULATORY_CARE_PROVIDER_SITE_OTHER): Payer: Medicare Other | Admitting: Family

## 2021-04-12 ENCOUNTER — Other Ambulatory Visit: Payer: Self-pay

## 2021-04-12 ENCOUNTER — Encounter (HOSPITAL_COMMUNITY)
Admission: RE | Admit: 2021-04-12 | Discharge: 2021-04-12 | Disposition: A | Discharge status: Home / Self Care | Payer: Medicare Other | Source: Ambulatory Visit | Attending: Urology | Admitting: Urology

## 2021-04-12 ENCOUNTER — Encounter (HOSPITAL_BASED_OUTPATIENT_CLINIC_OR_DEPARTMENT_OTHER): Payer: Self-pay | Admitting: Family

## 2021-04-12 ENCOUNTER — Encounter (HOSPITAL_COMMUNITY): Payer: Self-pay

## 2021-04-12 VITALS — BP 126/62 | HR 67 | Ht 70.0 in | Wt 123.2 lb

## 2021-04-12 DIAGNOSIS — I10 Essential (primary) hypertension: Secondary | ICD-10-CM | POA: Insufficient documentation

## 2021-04-12 DIAGNOSIS — F17210 Nicotine dependence, cigarettes, uncomplicated: Secondary | ICD-10-CM | POA: Insufficient documentation

## 2021-04-12 DIAGNOSIS — Z8551 Personal history of malignant neoplasm of bladder: Secondary | ICD-10-CM | POA: Diagnosis not present

## 2021-04-12 DIAGNOSIS — I2510 Atherosclerotic heart disease of native coronary artery without angina pectoris: Secondary | ICD-10-CM

## 2021-04-12 DIAGNOSIS — Z9861 Coronary angioplasty status: Secondary | ICD-10-CM

## 2021-04-12 DIAGNOSIS — Z01810 Encounter for preprocedural cardiovascular examination: Secondary | ICD-10-CM | POA: Diagnosis not present

## 2021-04-12 DIAGNOSIS — E785 Hyperlipidemia, unspecified: Secondary | ICD-10-CM | POA: Diagnosis not present

## 2021-04-12 DIAGNOSIS — Z01812 Encounter for preprocedural laboratory examination: Secondary | ICD-10-CM | POA: Insufficient documentation

## 2021-04-12 DIAGNOSIS — K219 Gastro-esophageal reflux disease without esophagitis: Secondary | ICD-10-CM | POA: Insufficient documentation

## 2021-04-12 DIAGNOSIS — I252 Old myocardial infarction: Secondary | ICD-10-CM | POA: Insufficient documentation

## 2021-04-12 DIAGNOSIS — Z79899 Other long term (current) drug therapy: Secondary | ICD-10-CM | POA: Diagnosis not present

## 2021-04-12 DIAGNOSIS — Z7901 Long term (current) use of anticoagulants: Secondary | ICD-10-CM | POA: Diagnosis not present

## 2021-04-12 NOTE — Progress Notes (Signed)
Office Visit    Patient Name: Kyle Foster Date of Encounter: 04/12/2021  PCP:  [name], NP   [facility]  Cardiologist:  Quay Burow, MD  Advanced Practice Provider:  No care team member to display Electrophysiologist:  None    Chief Complaint    Kyle Foster is a 75 y.o. 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 immunotherapy   Past Surgical History:  Procedure Laterality Date   CATARACT EXTRACTION W/ INTRAOCULAR LENS IMPLANT Right 12/2007   CORONARY ANGIOPLASTY WITH STENT PLACEMENT  06/2008   "3 stents" (06/14/2018)   CYSTOSCOPY W/ RETROGRADES  09/22/2011   Procedure: CYSTOSCOPY WITH RETROGRADE PYELOGRAM;  Surgeon: Bernestine Amass, MD;  Location: [facility];  Service: Urology;  Laterality: Left;  Cystoscopy left Retrograde Pyelogram      (c-arm)    CYSTOSCOPY WITH BIOPSY  09/22/2011   Procedure: CYSTOSCOPY WITH BIOPSY;  Surgeon: Bernestine Amass, MD;  Location: [facility];  Service: Urology;  Laterality: N/A;   Biopsy   EXCISIONAL HEMORRHOIDECTOMY  ~ 2006   EYE SURGERY Left 04/2011   "reconstruction; gold weight in eye lid " (06/14/2018)   FOOT NEUROMA SURGERY Left     INGUINAL HERNIA REPAIR Right 02/2018   IR IMAGING GUIDED PORT INSERTION  11/23/2018   NM MYOCAR PERF WALL MOTION  07/11/2008   MILD ISCHEMIA IN THE BASL INFERIOR, MID INFERIOR & APICAL INFERIOR REGIONS   SALIVARY GLAND SURGERY Left 04/2011   "squamous cell cancer attached to it; took the gland out"   SKIN CANCER EXCISION     "arms, hands, face, neck" (06/14/2018)   TRANSURETHRAL RESECTION OF BLADDER TUMOR WITH GYRUS (TURBT-GYRUS)  2010    Allergies  Allergies  Allergen Reactions   Codeine Other (See Comments)    HEADACHE  headaches    History of Present Illness    Kyle Foster is a 75 y.o. male with a hx of CAD s/p NSTEMI 06/2008 with BMS, HTN, HLD, tobacco use, stage IV lung cancer s/p radiation, chemo, immunotherapy last seen 05/08/20 by Dr. Gwenlyn Found.  He has followed with Dr. Gwenlyn Found. CAD is s/p NSTEMI in October 2009 with stenting of AV groove Cx and marginal branch bifurcation with driver BMS. Noted tandom 70% lesion in RCA and normal LVEF. Asymptomatic since that time.   He has been diagnosed with stage IV lung cancer and continues to maintain on Pembrolizumab with oncology.   He has upcoming cystoscopy with biopsy with Dr. Junious Silk of urology. Reports no shortness of breath nor dyspnea on exertion. Reports  no chest pain, pressure, or tightness. No edema, orthopnea, PND. Reports no palpitations.  He has been very active insulating a new outdoor stoage building in his yard. Does not check BP at home but says it is always well controlled in clinic visits. He and his wife follow heart healthy diet.   EKGs/Labs/Other Studies Reviewed:   The following studies were reviewed today:  Echo 03/08/2019 1. The left ventricle has normal systolic function, with an ejection  fraction of 55-60%. The cavity size was normal. Left ventricular diastolic  parameters were normal.   2. The right ventricle has normal systolic function. The cavity was  normal. There is no increase in right  ventricular wall thickness.   3. The mitral valve is degenerative. Mild thickening of the mitral valve  leaflet. Mild calcification of the mitral valve leaflet.   4. Tricuspid valve regurgitation is mild-moderate.   5. Moderate thickening of the aortic valve. Moderate calcification of the  aortic valve.   6. The average left ventricular global longitudinal strain is -17.0 %.   EKG:  EKG is  ordered today.  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 PR short at 104.   Recent Labs: 08/15/2020: TSH 1.704 04/03/2021: ALT 20; BUN 23; Creatinine 0.94; Hemoglobin 12.4; Platelets 149; Potassium 4.3; Sodium 139  Recent Lipid Panel    Component Value Date/Time   CHOL 150 05/08/2020 1640   TRIG 45 05/08/2020 1640   HDL 67 05/08/2020 1640   CHOLHDL 2.2 05/08/2020 1640   CHOLHDL 2.5 08/06/2016 1045   VLDL 10 08/06/2016 1045   LDLCALC 73 05/08/2020 1640    Home Medications   Current Meds  Medication Sig   acetaminophen (TYLENOL) 500 MG tablet Take 1,000 mg by mouth every 8 (eight) hours as needed for moderate pain.   atorvastatin (LIPITOR) 80 MG tablet TAKE 1 TABLET (80 MG TOTAL) BY MOUTH DAILY. NEEDS APPOINTMENT FOR FUTURE REFILLS   B Complex-C (B-COMPLEX WITH VITAMIN C) tablet Take 1 tablet by mouth daily.   calcium carbonate (TUMS - DOSED IN MG ELEMENTAL CALCIUM) 500 MG chewable tablet Chew 1 tablet (200 mg of elemental calcium total) by mouth 3 (three) times daily with meals. (Patient taking differently: Chew 1 tablet by mouth 3 (three) times daily as needed for indigestion.)   Cholecalciferol (VITAMIN D) 2000 UNITS tablet Take 2,000 Units by mouth daily.   clopidogrel (PLAVIX) 75 MG tablet TAKE 1 TABLET (75 MG TOTAL) BY MOUTH DAILY. NEEDS APPOINTMENT FOR FUTURE REFILLS   DENTA 5000 PLUS 1.1 % CREA dental cream Place 1 application onto teeth at bedtime.   DULoxetine (CYMBALTA) 30 MG capsule TAKE 1 CAPSULE BY MOUTH EVERY DAY (Patient taking  differently: Take 30 mg by mouth daily. TAKE 1 CAPSULE BY MOUTH EVERY DAY)   fentaNYL (DURAGESIC) 50 MCG/HR Place 1 patch onto the skin every 3 (three) days.   Hypromellose (ARTIFICIAL TEARS OP) Place 1 drop into both eyes at bedtime.    lidocaine-prilocaine (EMLA) cream Apply 1 application topically as needed (access port).   loratadine (CLARITIN) 10 MG tablet Take 10 mg by mouth daily as needed for allergies.   magnesium oxide (MAG-OX) 400 MG tablet Take 1 tablet (400 mg total) by mouth daily.   metoprolol tartrate (LOPRESSOR) 25 MG tablet TAKE 1 TABLET (25 MG TOTAL) BY MOUTH 2 (TWO) TIMES DAILY. NEEDS APPOINTMENT FOR FUTURE REFILLS   morphine (MSIR) 15 MG tablet Take 1-2 tablets (15-30 mg total)  by mouth every 4 (four) hours as needed for moderate pain or severe pain.   ondansetron (ZOFRAN) 8 MG tablet Take 1 tablet (8 mg total) by mouth every 8 (eight) hours as needed for nausea or vomiting.   pantoprazole (PROTONIX) 40 MG tablet TAKE 1 TABLET BY MOUTH EVERY DAY (Patient taking differently: Take 40 mg by mouth daily.)     Review of Systems      All other systems reviewed and are otherwise negative except as noted above.  Physical Exam    VS:  BP 126/62   Pulse 67   Ht 5\' 10"  (1.778 m)   Wt 123 lb 3.2 oz (55.9 kg)   BMI 17.68 kg/m  , BMI Body mass index is 17.68 kg/m.  Wt Readings from Last 3 Encounters:  04/12/21 123 lb (55.8 kg)  04/12/21 123 lb 3.2 oz (55.9 kg)  04/03/21 122 lb 12.8 oz (55.7 kg)     GEN: Well nourished, frail-appearing, well developed, in no acute distress. HEENT: normal. Neck: Supple, no JVD, carotid bruits, or masses. Cardiac: RRR, no murmurs, rubs, or gallops. No clubbing, cyanosis, edema.  Radials/PT 2+ and equal bilaterally.  Respiratory:  Respirations regular and unlabored, clear to auscultation bilaterally. GI: Soft, nontender, nondistended. MS: No deformity or atrophy. Skin: Warm and dry, no rash. Neuro:  Strength and sensation are  intact. Psych: Normal affect.  Assessment & Plan    Preop - upcoming cystoscopy. EKG today with no acute ST/T wave changes. He remains very active despite his cancer treatments. According to the Revised Cardiac Risk Index (RCRI), his Perioperative Risk of Major Cardiac Event is (%): 0.9. His  Functional Capacity in METs is: 6.05 according to the [employer] Activity Status Index (DASI). He is deemed acceptable risk for the planned procedure. He may hold Plavix 5 days prior to procedure but should resume promptly given history of CAD wit BMS.   CAD - Stable with no anginal symptoms. No indication for ischemic evaluation.  GDMT includes plavix, metoprolol, atorvastatin.   HTN - BP well controlled. Continue current antihypertensive regimen.    HLD - Continue atorvastatin 80mg  daily.  Disposition: Follow up in 6 month(s) with Dr. Gwenlyn Found or APP.  Signed, Loel Dubonnet, NP 04/12/2021, 11:47 PM [facility]

## 2021-04-12 NOTE — Patient Instructions (Signed)
Medication Instructions:  Continue your current medications.   Your may hold Plavix 5 days prior to your procedure. Ask Dr. Junious Silk when you should resume after your procedure.   *If you need a refill on your cardiac medications before your next appointment, please call your pharmacy*   Lab Work: None ordered today.   If you have labs (blood work) drawn today and your tests are completely normal, you will receive your results only by: [place] (if you have MyChart) OR A paper copy in the mail If you have any lab test that is abnormal or we need to change your treatment, we will call you to review the results.   Testing/Procedures: Your EKG today was stable compared to previous.   Follow-Up: At [facility], you and your health needs are our priority.  As part of our continuing mission to provide you with exceptional heart care, we have created designated Provider Care Teams.  These Care Teams include your primary Cardiologist (physician) and Advanced Practice Providers (APPs -  Physician Assistants and Nurse Practitioners) who all work together to provide you with the care you need, when you need it.  We recommend signing up for the patient portal called "MyChart".  Sign up information is provided on this After Visit Summary.  MyChart is used to connect with patients for Virtual Visits (Telemedicine).  Patients are able to view lab/test results, encounter notes, upcoming appointments, etc.  Non-urgent messages can be sent to your provider as well.   To learn more about what you can do with MyChart, go to [email or web address].    Your next appointment:   6 month(s)  The format for your next appointment:   In Person  Provider:   You may see Quay Burow, MD or one of the following Advanced Practice Providers on your designated Care Team:   [place], PA-C Coletta Memos, FNP   Other Instructions  Loel Dubonnet, NP will send a note to Dr. Junious Silk that you  are cleared for your planned procedure.   Heart Healthy Diet Recommendations: A low-salt diet is recommended. Meats should be grilled, baked, or boiled. Avoid fried foods. Focus on lean protein sources like fish or chicken with vegetables and fruits. The American Heart Association is a Microbiologist!   Exercise recommendations: The American Heart Association recommends 150 minutes of moderate intensity exercise weekly. Try 30 minutes of moderate intensity exercise 4-5 times per week. This could include walking, jogging, or swimming.

## 2021-04-12 NOTE — Progress Notes (Addendum)
Anesthesia Review:  PCP: [name], NP lov 03/22/21  Cardiologist : Ok Edwards on 04/12/2021 with Laurann Montana, NP  DR Lorie Phenix  Chest x-ray : 08/15/20- Ct Chest  EKG : 04/12/2021  Echo : 2020  PFT- 2020  Stress test: 2019  Cardiac Cath :  Activity level: cannot do a flight of stairs without difficulty  Sleep Study/ CPAP : none  Fasting Blood Sugar :      / Checks Blood Sugar -- times a day:   Blood Thinner/ Instructions /Last Dose: ASA / Instructions/ Last Dose :   04/03/21- CBC and CMp- normal  Plavix- Last dose on 04/11/2021 pm per pt  Fentanyl patch every 3 days per pt  PT has Port.  CBC and CMp done 04/03/21.  Pt reports having no bleeding issues or any change in medical status sinc 04/03/21.  Labs not done at preop since labs are good for 30 days.

## 2021-04-15 NOTE — Anesthesia Preprocedure Evaluation (Addendum)
Anesthesia Evaluation  Patient identified by MRN, date of birth, ID band Patient awake    Reviewed: Allergy & Precautions, NPO status , Patient's Chart, lab work & pertinent test results, reviewed documented beta blocker date and time   Airway Mallampati: IV  TM Distance: >3 FB Neck ROM: Full    Dental  (+) Teeth Intact, Dental Advisory Given   Pulmonary COPD, Current Smoker and Patient abstained from smoking.,  Lung ca    Pulmonary exam normal breath sounds clear to auscultation       Cardiovascular hypertension, Pt. on home beta blockers (-) angina+ CAD, + Past MI and + Cardiac Stents  Normal cardiovascular exam+ dysrhythmias (RBBB)  Rhythm:Regular Rate:Normal  Echo 03/08/2019 1. The left ventricle has normal systolic function, with an ejection  fraction of 55-60%. The cavity size was normal. Left ventricular diastolic  parameters were normal.  2. The right ventricle has normal systolic function. The cavity was  normal. There is no increase in right ventricular wall thickness.  3. The mitral valve is degenerative. Mild thickening of the mitral valve  leaflet. Mild calcification of the mitral valve leaflet.  4. Tricuspid valve regurgitation is mild-moderate.  5. Moderate thickening of the aortic valve. Moderate calcification of the  aortic valve.  6. The average left ventricular global longitudinal strain is -17.0 %.    Neuro/Psych negative neurological ROS  negative psych ROS   GI/Hepatic Neg liver ROS, GERD  Medicated and Controlled,  Endo/Other  negative endocrine ROS  Renal/GU negative Renal ROS   Bladder cancer     Musculoskeletal negative musculoskeletal ROS (+)   Abdominal   Peds  Hematology  (+) Blood dyscrasia (Plavix), ,   Anesthesia Other Findings Day of surgery medications reviewed with the patient.  Reproductive/Obstetrics                           Anesthesia  Physical Anesthesia Plan  ASA: 4  Anesthesia Plan: General   Post-op Pain Management:    Induction: Intravenous  PONV Risk Score and Plan: 2 and Dexamethasone, Ondansetron and Treatment may vary due to age or medical condition  Airway Management Planned: LMA  Additional Equipment:   Intra-op Plan:   Post-operative Plan: Extubation in OR  Informed Consent: I have reviewed the patients History and Physical, chart, labs and discussed the procedure including the risks, benefits and alternatives for the proposed anesthesia with the patient or authorized representative who has indicated his/her understanding and acceptance.     Dental advisory given  Plan Discussed with: CRNA  Anesthesia Plan Comments: (See PAT note 04/12/2021, Konrad Felix, PA-C)       Anesthesia Quick Evaluation
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
## 2021-04-16 ENCOUNTER — Telehealth: Payer: Self-pay | Admitting: *Deleted

## 2021-04-16 ENCOUNTER — Other Ambulatory Visit: Payer: Self-pay | Admitting: Hematology and Oncology

## 2021-04-16 DIAGNOSIS — C3492 Malignant neoplasm of unspecified part of left bronchus or lung: Secondary | ICD-10-CM

## 2021-04-16 MED ORDER — MORPHINE SULFATE 15 MG PO TABS: 15.0000 mg | ORAL_TABLET | ORAL | 0 refills | Status: DC | PRN

## 2021-04-16 MED ORDER — FENTANYL 50 MCG/HR TD PT72: 1.0000 | MEDICATED_PATCH | TRANSDERMAL | 0 refills | Status: DC

## 2021-04-16 NOTE — Telephone Encounter (Signed)
Patients wife called requesting refill of patients  Fentanyl 50 mcg and Morphine already on file to [facility].  Routed to Dr Lorenso Courier as Dr [name] is out of the office.

## 2021-04-18 NOTE — H&P (Signed)
Office Visit Report     03/18/2021   --------------------------------------------------------------------------------   Kyle Foster  MRN: [id number]  DOB: March 19, 1946, 75 year old Male  SSN: [id number]   PRIMARY CARE:  Angela L. Henrene Pastor, MD  REFERRING:  Georgette Dover, MD  PROVIDER:  [name], M.D.  LOCATION:  [facility] [phone]     --------------------------------------------------------------------------------   CC/HPI: F/u -   1) BPH - 2019 - no meds or surgery. On surveillance. His AUASS = 7. He was told his PSA is high. I looked through 32 pages of [place] notes and did not see elevated PSA. I also checked Epic/CE. PSA here was 2.88 in March 2020. June 2021 CT with a 30 g prostate.   He was diagnosed with stage IV lung cancer. CT A/P benign Jun 2021. He continues immunotherapy p completing chemo and XRT. 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 Difficult to exclude malignancy and mild prostate enlargement". Prostate was about 35 grams. This concerned the patient and he returns today, 04/22. His DRE is nl with 40 g prostate.    2) h/o bladder ca - small tumor on trigone in 2010. Cysto was negative 07/21.   Upper tract surveillance  06/21 CT a/p benign - left renal cyst  03/22 PET CT (lung ca) - benign - left renal cyst   His UA is clear. He's had no gross hematuria.       ALLERGIES: Codeine Derivatives    MEDICATIONS: Metoprolol Tartrate 25 mg tablet  Artificial Tears  Atorvastatin Calcium 80 mg tablet Oral  Calcium Carbonate  Denta 5000 Plus  Fentanyl  Fluorouracil 0.5 % cream  Lidocaine-Prilocaine  Magnesium Oxide  Marinol 5 mg capsule  Morphine Sulfate 15 mg tablet  Ondansetron Hcl 8 mg tablet  Pantoprazole Sodium 40 mg tablet, delayed release 0 Oral  Plavix 75 mg tablet 0 Oral  Trelegy Ellipta  Vitamin B-12  Vitamin D3     GU PSH: Cysto Bladder Ureth  Biopsy - 2013 Cystoscopy - 03/26/2020, 2020, 2019, 2018 Cystoscopy TURBT 2-5 cm - 2010 Vasectomy - 2009       PSH Notes: Cystoscopy With Biopsy, Lip Surgery, Eye Surgery, Surgery Excision Of Parotid Tumor/Gland, Cystoscopy With Fulguration Medium Lesion (2-5cm), Cataract Surgery, Foot Surgery, Hemorrhoidectomy, Surgery Of Male Genitalia Vasectomy, Shaving Of Lesion Arms   NON-GU PSH: Hemorrhoidectomy (favorite) - 2009 Lip Surgery Procedure - 2012     GU PMH: BPH w/o LUTS - 12/07/2020, Benign non-nodular prostatic hyperplasia without lower urinary tract symptoms, - 2017 History of bladder cancer - 12/07/2020, (Stable), - 2020 (Stable), - 2020, History of bladder cancer, - 2017 Prostate nodule w/o LUTS, nodule on PET scan, not exam. PSA was sent. Discussed We discussed the nature, risks and benefits of PSA screening as well as the nature of elevated PSA (benign versus malignant). We discussed the management of prostate cancer might include active surveillance or treatment depending on patient and cancer characteristics. In that context, we discussed the nature, risks and benefits of continued surveillance, other lab tests, transrectal ultrasound/prostate biopsy, or prostate MRI. All questions answered. - 12/07/2020 BPH w/LUTS - 2020, - 2020, Benign prostatic hyperplasia with urinary obstruction, - 2015 Weak Urinary Stream (Stable) - 2020, - 2020 Other microscopic hematuria, Microscopic hematuria - 2016 Renal calculus, Nephrolithiasis - 2016 Bladder Cancer, Unspec, Bladder cancer - 2015 ED due to arterial insufficiency, Erectile dysfunction due to arterial  insufficiency - 2014 Renal cyst, Renal cyst, acquired - 2014 Urinary Retention, Unspec, Urinary retention - 2014    NON-GU PMH: Encounter for general adult medical examination without abnormal findings, Encounter for preventive health examination - 2015 Personal history of other endocrine, nutritional and metabolic disease, History of  hypercholesterolemia - 2014 Personal history of other specified conditions, History of heartburn - 2014 Lung Cancer, History    FAMILY HISTORY: Acute Myocardial Infarction - Mother Diabetes - Mother Family Health Status Number - Runs In Family Father Deceased At Age62 ___ - Runs In Family Hypertension - Mother Mother Deceased At Age 9 from diabetic complicati - Runs In Family Stroke Syndrome - Father Uterine Cancer - Mother   SOCIAL HISTORY: None    Notes: Current every day smoker, Alcohol Use, Tobacco Use, Caffeine Use, Marital History - Currently Married, Occupation:   REVIEW OF SYSTEMS:    GU Review Male:   Patient denies frequent urination, hard to postpone urination, burning/ pain with urination, get up at night to urinate, leakage of urine, stream starts and stops, trouble starting your stream, have to strain to urinate , erection problems, and penile pain.  Gastrointestinal (Upper):   Patient denies nausea, vomiting, and indigestion/ heartburn.  Gastrointestinal (Lower):   Patient denies diarrhea and constipation.  Constitutional:   Patient denies fever, night sweats, weight loss, and fatigue.  Skin:   Patient denies skin rash/ lesion and itching.  Eyes:   Patient denies blurred vision and double vision.  Ears/ Nose/ Throat:   Patient denies sore throat and sinus problems.  Hematologic/Lymphatic:   Patient denies swollen glands and easy bruising.  Cardiovascular:   Patient denies leg swelling and chest pains.  Respiratory:   Patient denies cough and shortness of breath.  Endocrine:   Patient denies excessive thirst.  Musculoskeletal:   Patient denies back pain and joint pain.  Neurological:   Patient denies headaches and dizziness.  Psychologic:   Patient denies depression and anxiety.   VITAL SIGNS: None   GU PHYSICAL EXAMINATION:    Urethral Meatus: Normal size. No lesion, no wart, no discharge, no polyp. Normal location.  Penis: Circumcised, no warts, no cracks. No  dorsal Peyronie's plaques, no left corporal Peyronie's plaques, no right corporal Peyronie's plaques, no scarring, no warts. No balanitis, no meatal stenosis.   MULTI-SYSTEM PHYSICAL EXAMINATION:    Constitutional: Well-nourished. No physical deformities. Normally developed. Good grooming.  Neck: Neck symmetrical, not swollen. Normal tracheal position.  Respiratory: No labored breathing, no use of accessory muscles.   Cardiovascular: Normal temperature, normal extremity pulses, no swelling, no varicosities.  Skin: No paleness, no jaundice, no cyanosis. No lesion, no ulcer, no rash.  Neurologic / Psychiatric: Oriented to time, oriented to place, oriented to person. No depression, no anxiety, no agitation.  Gastrointestinal: No mass, no tenderness, no rigidity, non obese abdomen.     Complexity of Data:  X-Ray Review: PET Scan: Reviewed Films. 03/22 C.T. Abdomen/Pelvis: Reviewed Films. 06/21    12/07/20 11/30/18  PSA  Total PSA 3.19 ng/mL 2.88 ng/mL    PROCEDURES:         Flexible Cystoscopy - 52000  Risks, benefits, and some of the potential complications of the procedure were discussed with the patient. All questions were answered. Informed consent was obtained. Antibiotic prophylaxis was given -- Cephalexin. Sterile technique and intraurethral analgesia were used.  Meatus:  Normal size. Normal location. Normal condition.  Urethra:  No strictures.  External Sphincter:  Normal.  Verumontanum:  Normal.  Prostate:  Non-obstructing. No hyperplasia.  Bladder Neck:  Non-obstructing.  Ureteral Orifices:  Normal location. Normal size. Normal shape. Effluxed clear urine.  Bladder:  No trabeculation. No tumors. Normal mucosa. No stones. Erythema and possible early recurrence adjacent/lateral to left UO       The lower urinary tract was carefully examined. The procedure was well-tolerated and without complications. Antibiotic instructions were given. Instructions were given to call the office  immediately for bloody urine, difficulty urinating, painful urination, fever, chills, nausea, vomiting or other illness. The patient stated that he understood these instructions and would comply with them.         Urinalysis Dipstick Dipstick Cont'd  Color: Yellow Bilirubin: Neg mg/dL  Appearance: Clear Ketones: Neg mg/dL  Specific Gravity: 1.025 Blood: Neg ery/uL  pH: 5.5 Protein: Trace mg/dL  Glucose: Neg mg/dL Urobilinogen: 0.2 mg/dL    Nitrites: Neg    Leukocyte Esterase: Neg leu/uL    ASSESSMENT:      ICD-10 Details  1 GU:   History of bladder cancer - Z85.51 Chronic, Worsening - Disc cysto findings and the nature r/b/a to cysto bbx and he elects to proceed. Also plan RGP to go along with recent non con CT for PET.    PLAN:           Schedule Return Visit/Planned Activity: Next Available Appointment - Schedule Surgery          Document Letter(s):  Created for Patient: Clinical Summary    * Signed by [name], M.D. on 03/19/21 at 10:11 PM (EDT)*     The information contained in this medical record document is considered private and confidential patient information. This information can only be used for the medical diagnosis and/or medical services that are being provided by the patient's selected caregivers. This information can only be distributed outside of the patient's care if the patient agrees and signs waivers of authorization for this information to be sent to an outside source or route.

## 2021-04-19 ENCOUNTER — Encounter (HOSPITAL_COMMUNITY): Payer: Self-pay | Admitting: Urology

## 2021-04-19 ENCOUNTER — Encounter (HOSPITAL_COMMUNITY)
Admission: RE | Disposition: A | Discharge status: Home / Self Care | Payer: Self-pay | Source: Home / Self Care | Attending: Urology

## 2021-04-19 ENCOUNTER — Ambulatory Visit (HOSPITAL_COMMUNITY): Payer: Medicare Other

## 2021-04-19 ENCOUNTER — Ambulatory Visit (HOSPITAL_COMMUNITY)
Admission: RE | Admit: 2021-04-19 | Discharge: 2021-04-19 | Disposition: A | Discharge status: Home / Self Care | Payer: Medicare Other | Attending: Urology | Admitting: Urology

## 2021-04-19 ENCOUNTER — Ambulatory Visit (HOSPITAL_COMMUNITY): Payer: Medicare Other | Admitting: Physician Assistant

## 2021-04-19 ENCOUNTER — Ambulatory Visit (HOSPITAL_COMMUNITY): Payer: Medicare Other | Admitting: Anesthesiology

## 2021-04-19 DIAGNOSIS — Z9221 Personal history of antineoplastic chemotherapy: Secondary | ICD-10-CM | POA: Diagnosis not present

## 2021-04-19 DIAGNOSIS — D414 Neoplasm of uncertain behavior of bladder: Secondary | ICD-10-CM

## 2021-04-19 DIAGNOSIS — Z79891 Long term (current) use of opiate analgesic: Secondary | ICD-10-CM | POA: Diagnosis not present

## 2021-04-19 DIAGNOSIS — C3490 Malignant neoplasm of unspecified part of unspecified bronchus or lung: Secondary | ICD-10-CM | POA: Diagnosis not present

## 2021-04-19 DIAGNOSIS — Z885 Allergy status to narcotic agent status: Secondary | ICD-10-CM | POA: Diagnosis not present

## 2021-04-19 DIAGNOSIS — Z923 Personal history of irradiation: Secondary | ICD-10-CM | POA: Insufficient documentation

## 2021-04-19 DIAGNOSIS — Z7951 Long term (current) use of inhaled steroids: Secondary | ICD-10-CM | POA: Diagnosis not present

## 2021-04-19 DIAGNOSIS — Z79899 Other long term (current) drug therapy: Secondary | ICD-10-CM | POA: Diagnosis not present

## 2021-04-19 DIAGNOSIS — F17200 Nicotine dependence, unspecified, uncomplicated: Secondary | ICD-10-CM | POA: Diagnosis not present

## 2021-04-19 DIAGNOSIS — E782 Mixed hyperlipidemia: Secondary | ICD-10-CM | POA: Diagnosis not present

## 2021-04-19 DIAGNOSIS — C672 Malignant neoplasm of lateral wall of bladder: Secondary | ICD-10-CM | POA: Diagnosis not present

## 2021-04-19 DIAGNOSIS — Z7902 Long term (current) use of antithrombotics/antiplatelets: Secondary | ICD-10-CM | POA: Diagnosis not present

## 2021-04-19 DIAGNOSIS — C679 Malignant neoplasm of bladder, unspecified: Secondary | ICD-10-CM | POA: Diagnosis present

## 2021-04-19 DIAGNOSIS — Z8551 Personal history of malignant neoplasm of bladder: Secondary | ICD-10-CM | POA: Insufficient documentation

## 2021-04-19 DIAGNOSIS — D090 Carcinoma in situ of bladder: Secondary | ICD-10-CM | POA: Diagnosis not present

## 2021-04-19 DIAGNOSIS — N400 Enlarged prostate without lower urinary tract symptoms: Secondary | ICD-10-CM | POA: Diagnosis not present

## 2021-04-19 DIAGNOSIS — I2510 Atherosclerotic heart disease of native coronary artery without angina pectoris: Secondary | ICD-10-CM | POA: Diagnosis not present

## 2021-04-19 DIAGNOSIS — C678 Malignant neoplasm of overlapping sites of bladder: Secondary | ICD-10-CM | POA: Diagnosis not present

## 2021-04-19 DIAGNOSIS — I10 Essential (primary) hypertension: Secondary | ICD-10-CM | POA: Diagnosis not present

## 2021-04-19 HISTORY — PX: CYSTOSCOPY WITH BIOPSY: SHX5122

## 2021-04-19 SURGERY — CYSTOSCOPY, WITH BIOPSY
Anesthesia: General

## 2021-04-19 MED ORDER — LIDOCAINE 2% (20 MG/ML) 5 ML SYRINGE
INTRAMUSCULAR | Status: AC
  Filled 2021-04-19: qty 5

## 2021-04-19 MED ORDER — ONDANSETRON HCL 4 MG/2ML IJ SOLN
INTRAMUSCULAR | Status: DC | PRN
  Administered 2021-04-19: 4 mg via INTRAVENOUS

## 2021-04-19 MED ORDER — ONDANSETRON HCL 4 MG/2ML IJ SOLN: 4.0000 mg | Freq: Once | INTRAMUSCULAR | Status: DC | PRN

## 2021-04-19 MED ORDER — GEMCITABINE CHEMO FOR BLADDER INSTILLATION 2000 MG
2000.0000 mg | Freq: Once | INTRAVENOUS | Status: AC
  Administered 2021-04-19: 2000 mg via INTRAVESICAL
  Filled 2021-04-19: qty 2000

## 2021-04-19 MED ORDER — CEFAZOLIN SODIUM-DEXTROSE 2-4 GM/100ML-% IV SOLN
2.0000 g | INTRAVENOUS | Status: AC
  Administered 2021-04-19: 2 g via INTRAVENOUS
  Filled 2021-04-19: qty 100

## 2021-04-19 MED ORDER — LACTATED RINGERS IV SOLN: INTRAVENOUS | Status: DC

## 2021-04-19 MED ORDER — ONDANSETRON HCL 4 MG/2ML IJ SOLN
INTRAMUSCULAR | Status: AC
  Filled 2021-04-19: qty 2

## 2021-04-19 MED ORDER — CLOPIDOGREL BISULFATE 75 MG PO TABS: 75.0000 mg | ORAL_TABLET | Freq: Every day | ORAL | 3 refills | Status: DC

## 2021-04-19 MED ORDER — STERILE WATER FOR IRRIGATION IR SOLN
Status: DC | PRN
  Administered 2021-04-19: 3000 mL

## 2021-04-19 MED ORDER — ACETAMINOPHEN 500 MG PO TABS
ORAL_TABLET | ORAL | Status: AC
  Filled 2021-04-19: qty 2

## 2021-04-19 MED ORDER — PHENYLEPHRINE 40 MCG/ML (10ML) SYRINGE FOR IV PUSH (FOR BLOOD PRESSURE SUPPORT)
PREFILLED_SYRINGE | INTRAVENOUS | Status: DC | PRN
  Administered 2021-04-19: 80 ug via INTRAVENOUS
  Administered 2021-04-19: 120 ug via INTRAVENOUS
  Administered 2021-04-19: 80 ug via INTRAVENOUS

## 2021-04-19 MED ORDER — IOHEXOL 300 MG/ML  SOLN
INTRAMUSCULAR | Status: DC | PRN
  Administered 2021-04-19: 10 mL

## 2021-04-19 MED ORDER — DEXAMETHASONE SODIUM PHOSPHATE 10 MG/ML IJ SOLN
INTRAMUSCULAR | Status: DC | PRN
  Administered 2021-04-19: 5 mg via INTRAVENOUS

## 2021-04-19 MED ORDER — ACETAMINOPHEN 500 MG PO TABS
1000.0000 mg | ORAL_TABLET | Freq: Once | ORAL | Status: AC
  Administered 2021-04-19: 1000 mg via ORAL

## 2021-04-19 MED ORDER — FENTANYL CITRATE (PF) 100 MCG/2ML IJ SOLN
INTRAMUSCULAR | Status: AC
  Filled 2021-04-19: qty 2

## 2021-04-19 MED ORDER — PROPOFOL 10 MG/ML IV BOLUS
INTRAVENOUS | Status: DC | PRN
  Administered 2021-04-19: 100 mg via INTRAVENOUS

## 2021-04-19 MED ORDER — PROPOFOL 10 MG/ML IV BOLUS
INTRAVENOUS | Status: AC
  Filled 2021-04-19: qty 20

## 2021-04-19 MED ORDER — DEXAMETHASONE SODIUM PHOSPHATE 10 MG/ML IJ SOLN
INTRAMUSCULAR | Status: AC
  Filled 2021-04-19: qty 1

## 2021-04-19 MED ORDER — CHLORHEXIDINE GLUCONATE 0.12 % MT SOLN
15.0000 mL | Freq: Once | OROMUCOSAL | Status: AC
  Administered 2021-04-19: 15 mL via OROMUCOSAL

## 2021-04-19 MED ORDER — FENTANYL CITRATE (PF) 100 MCG/2ML IJ SOLN: 25.0000 ug | INTRAMUSCULAR | Status: DC | PRN

## 2021-04-19 MED ORDER — FENTANYL CITRATE (PF) 100 MCG/2ML IJ SOLN
INTRAMUSCULAR | Status: DC | PRN
  Administered 2021-04-19 (×4): 25 ug via INTRAVENOUS

## 2021-04-19 MED ORDER — LIDOCAINE 2% (20 MG/ML) 5 ML SYRINGE
INTRAMUSCULAR | Status: DC | PRN
  Administered 2021-04-19: 60 mg via INTRAVENOUS

## 2021-04-19 MED ORDER — 0.9 % SODIUM CHLORIDE (POUR BTL) OPTIME
TOPICAL | Status: DC | PRN
  Administered 2021-04-19: 1000 mL

## 2021-04-19 SURGICAL SUPPLY — 16 items
BAG DRN RND TRDRP ANRFLXCHMBR (UROLOGICAL SUPPLIES) ×1
BAG URINE DRAIN 2000ML AR STRL (UROLOGICAL SUPPLIES) ×2 IMPLANT
BAG URO CATCHER STRL LF (MISCELLANEOUS) ×3 IMPLANT
CATH TIEMANN FOLEY 18FR 5CC (CATHETERS) ×2 IMPLANT
CATH URET 5FR 28IN OPEN ENDED (CATHETERS) ×2 IMPLANT
DRAPE FOOT SWITCH (DRAPES) ×3 IMPLANT
GLOVE SURG ENC TEXT LTX SZ7.5 (GLOVE) ×5 IMPLANT
GOWN STRL REUS W/TWL XL LVL3 (GOWN DISPOSABLE) ×7 IMPLANT
KIT TURNOVER KIT A (KITS) ×3 IMPLANT
LOOP CUT BIPOLAR 24F LRG (ELECTROSURGICAL) IMPLANT
MANIFOLD NEPTUNE II (INSTRUMENTS) ×3 IMPLANT
PACK CYSTO (CUSTOM PROCEDURE TRAY) ×3 IMPLANT
PENCIL SMOKE EVACUATOR (MISCELLANEOUS) IMPLANT
TUBING CONNECTING 10 (TUBING) ×2 IMPLANT
TUBING CONNECTING 10' (TUBING) ×1
TUBING UROLOGY SET (TUBING) ×1 IMPLANT

## 2021-04-19 NOTE — Anesthesia Postprocedure Evaluation (Signed)
Anesthesia Post Note  Patient: Kyle Foster  Procedure(s) Performed: CYSTOSCOPY WITH BLADDER  BIOPSY WITH [place]     Patient location during evaluation: PACU Anesthesia Type: General Level of consciousness: awake and alert, awake and oriented Pain management: pain level controlled Vital Signs Assessment: post-procedure vital signs reviewed and stable Respiratory status: spontaneous breathing, nonlabored ventilation and respiratory function stable Cardiovascular status: blood pressure returned to baseline and stable Postop Assessment: no apparent nausea or vomiting Anesthetic complications: no   No notable events documented.  Last Vitals:  Vitals:   04/19/21 1630 04/19/21 1650  BP: (!) 147/71 136/72  Pulse: 61 62  Resp: 17 18  Temp: 36.6 C   SpO2: 96% 97%    Last Pain:  Vitals:   04/19/21 1650  TempSrc:   PainSc: 0-No pain                 Catalina Gravel

## 2021-04-19 NOTE — Discharge Instructions (Addendum)
You may see some blood in the urine and may have some burning with urination for 48-72 hours. You also may notice that you have to urinate more frequently or urgently after your procedure which is normal.  You should call should you develop an inability urinate, fever > 101, persistent nausea and vomiting that prevents you from eating or drinking to stay hydrated.  If you have a stent, you will likely urinate more frequently and urgently until the stent is removed and you may experience some discomfort/pain in the lower abdomen and flank especially when urinating. You may take pain medication prescribed to you if needed for pain. You may also intermittently have blood in the urine until the stent is removed. you have a catheter, you will be taught how to take care of the catheter by the nursing staff prior to discharge from the hospital.  You may periodically feel a strong urge to void with the catheter in place.  This is a bladder spasm and most often can occur when having a bowel movement or moving around. It is typically self-limited and usually will stop after a few minutes.  You may use some Vaseline or Neosporin around the tip of the catheter to reduce friction at the tip of the penis. You may also see some blood in the urine.  A very small amount of blood can make the urine look quite red.  As long as the catheter is draining well, there usually is not a problem.  However, if the catheter is not draining well and is bloody, you should call the office [phone]) to notify us. Remove the foley as instructed in the morning, 04/20/2021, as early as possible to make sure you can urinate throughout the day.

## 2021-04-19 NOTE — Anesthesia Procedure Notes (Signed)
Procedure Name: LMA Insertion Date/Time: 04/19/2021 2:20 PM Performed by: Sharlette Dense, CRNA Patient Re-evaluated:Patient Re-evaluated prior to induction Oxygen Delivery Method: Circle system utilized Preoxygenation: Pre-oxygenation with 100% oxygen Induction Type: IV induction LMA: LMA inserted LMA Size: 4.0 Number of attempts: 1 Placement Confirmation: positive ETCO2 and breath sounds checked- equal and bilateral Tube secured with: Tape Dental Injury: Teeth and Oropharynx as per pre-operative assessment

## 2021-04-19 NOTE — Interval H&P Note (Signed)
History and Physical Interval Note:  04/19/2021 2:09 PM  Kyle Foster  has presented today for surgery, with the diagnosis of HISTORY OF BLADDER CANCER.  The various methods of treatment have been discussed with the patient and family. After consideration of risks, benefits and other options for treatment, the patient has consented to  Procedure(s): CYSTOSCOPY WITH BIOPSY (N/A) as a surgical intervention.  The patient's history has been reviewed, patient examined, no change in status, stable for surgery.  I have reviewed the patient's chart and labs.  Questions were answered to the patient's satisfaction.  He has been well - no cough, congestion or fever. No dysuria.    [name]

## 2021-04-19 NOTE — Op Note (Addendum)
Preoperative Diagnosis: History of bladder cancer in 2010, erythema/possible early recurrence on local cystoscopy near the left Ureteral orifice  Postoperative Diagnosis:  Same as abover  Procedure(s) Performed:   Bladder biopsy and Cystourethroscopy and fulguration 0.5 to 2.0 cm  Surgeon:  [name], MD  Resident Surgeon:  Bishop Limbo, MD  Anesthesia:   General  Fluids:  See anesthesia record  Estimated blood loss: Minimal  Specimens:  Left Bladder biopsy to pathology   Cultures:  None  Drains:  None  Complications:  None  Indications: History of bladder cancer in 2010, resected. No recurrence. Area of erythema seen on surveillance cystoscopy in office near left ureteral orifice  Findings: There was superficial erythema and mild edema just lateral to the left ureteral orifice as well as just posterior to left ureteral orifice and left trigone which appeared more early papillary formations making up a total of about 1.5 cm.  2 biopsy bites were taken one for each of these areas.  Hemostasis achieved with monopolar Bugbee electrocautery.  Retrograde pyelogram findings: Right retrograde pyelogram revealed no filling defects, no contrast extravasation, no hydronephrosis.  Left retrograde pyelogram revealed no filling defects, no contrast extravasation, no hydronephrosis.  The upper pole infundibulum was interestingly narrow and long.  Description:  The patient was correctly identified in the preop holding area where written informed consent as well potential risk and complication reviewed. He agreed. They were brought back to the operative suite where a preinduction timeout was performed. Once correct information was verified, general anesthesia was induced. They were then gently placed into dorsal lithotomy position with SCDs in place for VTE prophylaxis. They were prepped and draped in the usual sterile fashion and given appropriate preoperative antibiotics with  Cefazolin .  A second timeout was then performed.   We inserted a 22F rigid cystoscope per urethra with copious lubrication and sterile water irrigation running. This demonstrated findings as described above.    We cannulated the left ureteral orifice with a 5 French open ended catheter and performed retrograde pyelogram from the level of the distal ureter with findings as above.  This was repeated from the level of the right ureteral orifice with findings as above.  We next switched our bridge for a cold cup bladder biopsy forcep working element.  2 bites were taken from the areas as noted above.  These were sent for final pathology.  We next exchanged the bridge for our single lumen bridge.  We introduced a monopolar Bugbee electrode and achieved hemostasis with electrocautery.  We then emptied the bladder and reinspected for hemostasis.  Next we withdrew our instrumentation.  We inserted an 30 [place] coud catheter with 10 cc of sterile water in the balloon.  Instillation of gemcitabine in PACU: 2,000 mg gemcitabine was put into bladder and left indwelling for about 1 hour.   Post Op Plan:   -Patient will receive gemcitabine in the PACU - Keep catheter clamped for 1 hour with gemcitabine in the bladder.  Afterwards will d/c with foley as Melissa reports he has had retention issues p procedures. Will be taught to remove foley in AM.   1. Discharge patient home when meets PACU criteria/gemcitabine is emptied. 2. We will follow up pathoilogy results and discuss next steps with the patient   I was present and scrubbed for the entire procedure.

## 2021-04-19 NOTE — Transfer of Care (Signed)
Immediate Anesthesia Transfer of Care Note  Patient: Kyle Foster  Procedure(s) Performed: CYSTOSCOPY WITH BLADDER  BIOPSY WITH FULGERATION  Patient Location: PACU  Anesthesia Type:General  Level of Consciousness: awake and alert   Airway & Oxygen Therapy: Patient Spontanous Breathing and Patient connected to face mask oxygen  Post-op Assessment: Report given to RN and Post -op Vital signs reviewed and stable  Post vital signs: Reviewed and stable  Last Vitals:  Vitals Value Taken Time  BP 130/80 04/19/21 1506  Temp 36.5 C 04/19/21 1506  Pulse 62 04/19/21 1508  Resp 16 04/19/21 1508  SpO2 100 % 04/19/21 1508  Vitals shown include unvalidated device data.  Last Pain:  Vitals:   04/19/21 1506  TempSrc:   PainSc: Asleep         Complications: No notable events documented.

## 2021-04-20 ENCOUNTER — Encounter (HOSPITAL_COMMUNITY): Payer: Self-pay | Admitting: Urology

## 2021-04-22 LAB — SURGICAL PATHOLOGY

## 2021-04-23 ENCOUNTER — Ambulatory Visit (HOSPITAL_COMMUNITY)
Admission: RE | Admit: 2021-04-23 | Discharge: 2021-04-23 | Disposition: A | Discharge status: Home / Self Care | Payer: No Typology Code available for payment source | Source: Ambulatory Visit | Attending: Hematology | Admitting: Hematology

## 2021-04-23 ENCOUNTER — Other Ambulatory Visit: Payer: Self-pay

## 2021-04-23 ENCOUNTER — Telehealth: Payer: Self-pay | Admitting: Hematology

## 2021-04-23 ENCOUNTER — Encounter (HOSPITAL_COMMUNITY): Payer: Self-pay

## 2021-04-23 DIAGNOSIS — C3492 Malignant neoplasm of unspecified part of left bronchus or lung: Secondary | ICD-10-CM | POA: Diagnosis not present
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
## 2021-04-23 NOTE — Telephone Encounter (Signed)
Rescheduled upcoming appointment due to provider's emergency. Patient is aware of changes. 

## 2021-04-24 ENCOUNTER — Other Ambulatory Visit: Payer: Self-pay

## 2021-04-24 ENCOUNTER — Inpatient Hospital Stay: Payer: No Typology Code available for payment source | Attending: Hematology

## 2021-04-24 ENCOUNTER — Inpatient Hospital Stay (HOSPITAL_BASED_OUTPATIENT_CLINIC_OR_DEPARTMENT_OTHER): Payer: No Typology Code available for payment source | Admitting: Hematology

## 2021-04-24 ENCOUNTER — Inpatient Hospital Stay: Payer: No Typology Code available for payment source | Admitting: Hematology

## 2021-04-24 ENCOUNTER — Inpatient Hospital Stay: Payer: No Typology Code available for payment source

## 2021-04-24 ENCOUNTER — Encounter: Payer: Self-pay | Admitting: Hematology

## 2021-04-24 VITALS — BP 100/56 | HR 66 | Temp 98.0°F | Resp 17 | Wt 124.5 lb

## 2021-04-24 DIAGNOSIS — C7951 Secondary malignant neoplasm of bone: Secondary | ICD-10-CM

## 2021-04-24 DIAGNOSIS — Z8551 Personal history of malignant neoplasm of bladder: Secondary | ICD-10-CM | POA: Insufficient documentation

## 2021-04-24 DIAGNOSIS — Z85828 Personal history of other malignant neoplasm of skin: Secondary | ICD-10-CM | POA: Insufficient documentation

## 2021-04-24 DIAGNOSIS — E785 Hyperlipidemia, unspecified: Secondary | ICD-10-CM | POA: Diagnosis not present

## 2021-04-24 DIAGNOSIS — C3412 Malignant neoplasm of upper lobe, left bronchus or lung: Secondary | ICD-10-CM

## 2021-04-24 DIAGNOSIS — Z5112 Encounter for antineoplastic immunotherapy: Secondary | ICD-10-CM | POA: Diagnosis not present

## 2021-04-24 DIAGNOSIS — Z79899 Other long term (current) drug therapy: Secondary | ICD-10-CM | POA: Diagnosis not present

## 2021-04-24 DIAGNOSIS — I10 Essential (primary) hypertension: Secondary | ICD-10-CM | POA: Diagnosis not present

## 2021-04-24 DIAGNOSIS — C3492 Malignant neoplasm of unspecified part of left bronchus or lung: Secondary | ICD-10-CM

## 2021-04-24 DIAGNOSIS — G893 Neoplasm related pain (acute) (chronic): Secondary | ICD-10-CM | POA: Diagnosis not present

## 2021-04-24 DIAGNOSIS — Z7189 Other specified counseling: Secondary | ICD-10-CM

## 2021-04-24 LAB — CBC WITH DIFFERENTIAL/PLATELET
Abs Immature Granulocytes: 0.03 10*3/uL (ref 0.00–0.07)
Basophils Absolute: 0 10*3/uL (ref 0.0–0.1)
Basophils Relative: 0 %
Eosinophils Absolute: 0.2 10*3/uL (ref 0.0–0.5)
Eosinophils Relative: 3 %
HCT: 35.6 % — ABNORMAL LOW (ref 39.0–52.0)
Hemoglobin: 12.2 g/dL — ABNORMAL LOW (ref 13.0–17.0)
Immature Granulocytes: 0 %
Lymphocytes Relative: 5 %
Lymphs Abs: 0.4 10*3/uL — ABNORMAL LOW (ref 0.7–4.0)
MCH: 33.3 pg (ref 26.0–34.0)
MCHC: 34.3 g/dL (ref 30.0–36.0)
MCV: 97.3 fL (ref 80.0–100.0)
Monocytes Absolute: 0.2 10*3/uL (ref 0.1–1.0)
Monocytes Relative: 3 %
Neutro Abs: 7.4 10*3/uL (ref 1.7–7.7)
Neutrophils Relative %: 89 %
Platelets: 148 10*3/uL — ABNORMAL LOW (ref 150–400)
RBC: 3.66 MIL/uL — ABNORMAL LOW (ref 4.22–5.81)
RDW: 13.2 % (ref 11.5–15.5)
WBC: 8.4 10*3/uL (ref 4.0–10.5)
nRBC: 0 % (ref 0.0–0.2)

## 2021-04-24 LAB — CMP (CANCER CENTER ONLY)
ALT: 25 U/L (ref 0–44)
AST: 26 U/L (ref 15–41)
Albumin: 3.5 g/dL (ref 3.5–5.0)
Alkaline Phosphatase: 61 U/L (ref 38–126)
Anion gap: 9 (ref 5–15)
BUN: 27 mg/dL — ABNORMAL HIGH (ref 8–23)
CO2: 27 mmol/L (ref 22–32)
Calcium: 9.3 mg/dL (ref 8.9–10.3)
Chloride: 102 mmol/L (ref 98–111)
Creatinine: 0.99 mg/dL (ref 0.61–1.24)
GFR, Estimated: >60 mL/min (ref >60)
Glucose, Bld: 93 mg/dL (ref 70–99)
Potassium: 4.8 mmol/L (ref 3.5–5.1)
Sodium: 138 mmol/L (ref 135–145)
Total Bilirubin: 0.4 mg/dL (ref 0.3–1.2)
Total Protein: 6.6 g/dL (ref 6.5–8.1)

## 2021-04-24 LAB — TSH: TSH: 2.621 u[IU]/mL (ref 0.320–4.118)

## 2021-04-24 MED ORDER — FAMOTIDINE 20 MG IN NS 100 ML IVPB
20.0000 mg | Freq: Once | INTRAVENOUS | Status: AC
  Administered 2021-04-24: 20 mg via INTRAVENOUS
  Filled 2021-04-24: qty 100

## 2021-04-24 MED ORDER — DENOSUMAB 120 MG/1.7ML ~~LOC~~ SOLN
120.0000 mg | Freq: Once | SUBCUTANEOUS | Status: AC
  Administered 2021-04-24: 120 mg via SUBCUTANEOUS
  Filled 2021-04-24: qty 1.7

## 2021-04-24 MED ORDER — DIPHENHYDRAMINE HCL 50 MG/ML IJ SOLN
25.0000 mg | Freq: Once | INTRAMUSCULAR | Status: AC
  Administered 2021-04-24: 25 mg via INTRAVENOUS
  Filled 2021-04-24: qty 1

## 2021-04-24 MED ORDER — HEPARIN SOD (PORK) LOCK FLUSH 100 UNIT/ML IV SOLN
500.0000 [IU] | Freq: Once | INTRAVENOUS | Status: AC | PRN
  Administered 2021-04-24: 500 [IU]

## 2021-04-24 MED ORDER — SODIUM CHLORIDE 0.9 % IV SOLN
200.0000 mg | Freq: Once | INTRAVENOUS | Status: AC
  Administered 2021-04-24: 200 mg via INTRAVENOUS
  Filled 2021-04-24: qty 8

## 2021-04-24 MED ORDER — SODIUM CHLORIDE 0.9% FLUSH
10.0000 mL | INTRAVENOUS | Status: DC | PRN
  Administered 2021-04-24: 10 mL

## 2021-04-24 MED ORDER — SODIUM CHLORIDE 0.9% FLUSH
10.0000 mL | INTRAVENOUS | Status: DC | PRN
Start: 2021-04-24 — End: 2021-04-24
  Administered 2021-04-24: 10 mL

## 2021-04-24 MED ORDER — SODIUM CHLORIDE 0.9 % IV SOLN
Freq: Once | INTRAVENOUS | Status: AC
Start: 2021-04-24 — End: 2021-04-24

## 2021-04-24 NOTE — Patient Instructions (Signed)
[facility]   Discharge Instructions: Thank you for choosing [facility] to provide your oncology and hematology care.   If you have a lab appointment with the [facility], please go directly to the [facility] and check in at the registration area.   Wear comfortable clothing and clothing appropriate for easy access to any Portacath or PICC line.   We strive to give you quality time with your provider. You may need to reschedule your appointment if you arrive late (15 or more minutes).  Arriving late affects you and other patients whose appointments are after yours.  Also, if you miss three or more appointments without notifying the office, you may be dismissed from the clinic at the provider's discretion.      For prescription refill requests, have your pharmacy contact our office and allow 72 hours for refills to be completed.    Today you received the following chemotherapy and/or immunotherapy agents: pembrolizumab.      To help prevent nausea and vomiting after your treatment, we encourage you to take your nausea medication as directed.  BELOW ARE SYMPTOMS THAT SHOULD BE REPORTED IMMEDIATELY: *FEVER GREATER THAN 100.4 F (38 C) OR HIGHER *CHILLS OR SWEATING *NAUSEA AND VOMITING THAT IS NOT CONTROLLED WITH YOUR NAUSEA MEDICATION *UNUSUAL SHORTNESS OF BREATH *UNUSUAL BRUISING OR BLEEDING *URINARY PROBLEMS (pain or burning when urinating, or frequent urination) *BOWEL PROBLEMS (unusual diarrhea, constipation, pain near the anus) TENDERNESS IN MOUTH AND THROAT WITH OR WITHOUT PRESENCE OF ULCERS (sore throat, sores in mouth, or a toothache) UNUSUAL RASH, SWELLING OR PAIN  UNUSUAL VAGINAL DISCHARGE OR ITCHING   Items with * indicate a potential emergency and should be followed up as soon as possible or go to the Emergency Department if any problems should occur.  Please show the CHEMOTHERAPY ALERT CARD or IMMUNOTHERAPY ALERT CARD at  check-in to the Emergency Department and triage nurse.  Should you have questions after your visit or need to cancel or reschedule your appointment, please contact [facility]  Dept: [phone]  and follow the prompts.  Office hours are 8:00 a.m. to 4:30 p.m. Monday - Friday. Please note that voicemails left after 4:00 p.m. may not be returned until the following business day.  We are closed weekends and major holidays. You have access to a nurse at all times for urgent questions. Please call the main number to the clinic Dept: [phone] and follow the prompts.   For any non-urgent questions, you may also contact your provider using MyChart. We now offer e-Visits for anyone 33 and older to request care online for non-urgent symptoms. For details visit [email or web address].   Also download the MyChart app! Go to the app store, search "MyChart", open the app, select [facility], and log in with your MyChart username and password.  Due to Covid, a mask is required upon entering the hospital/clinic. If you do not have a mask, one will be given to you upon arrival. For doctor visits, patients may have 1 support person aged 30 or older with them. For treatment visits, patients cannot have anyone with them due to current Covid guidelines and our immunocompromised population.

## 2021-04-30 ENCOUNTER — Telehealth: Payer: Self-pay | Admitting: Hematology

## 2021-04-30 NOTE — Telephone Encounter (Signed)
R/s appts times per [facility]. Called pt, no answer. Left msg with updated appts date and times.

## 2021-05-14 DIAGNOSIS — G510 Bell's palsy: Secondary | ICD-10-CM | POA: Diagnosis not present

## 2021-05-14 DIAGNOSIS — Z961 Presence of intraocular lens: Secondary | ICD-10-CM | POA: Diagnosis not present

## 2021-05-14 DIAGNOSIS — H04123 Dry eye syndrome of bilateral lacrimal glands: Secondary | ICD-10-CM | POA: Diagnosis not present

## 2021-05-15 ENCOUNTER — Other Ambulatory Visit: Payer: No Typology Code available for payment source

## 2021-05-15 ENCOUNTER — Other Ambulatory Visit: Payer: Self-pay

## 2021-05-15 ENCOUNTER — Inpatient Hospital Stay: Payer: No Typology Code available for payment source | Attending: Hematology

## 2021-05-15 ENCOUNTER — Inpatient Hospital Stay: Payer: No Typology Code available for payment source

## 2021-05-15 ENCOUNTER — Ambulatory Visit: Payer: No Typology Code available for payment source

## 2021-05-15 VITALS — BP 107/80 | HR 62 | Temp 97.6°F | Resp 16 | Ht 70.0 in | Wt 121.0 lb

## 2021-05-15 DIAGNOSIS — C3412 Malignant neoplasm of upper lobe, left bronchus or lung: Secondary | ICD-10-CM

## 2021-05-15 DIAGNOSIS — Z8551 Personal history of malignant neoplasm of bladder: Secondary | ICD-10-CM | POA: Diagnosis not present

## 2021-05-15 DIAGNOSIS — C3492 Malignant neoplasm of unspecified part of left bronchus or lung: Secondary | ICD-10-CM

## 2021-05-15 DIAGNOSIS — C7951 Secondary malignant neoplasm of bone: Secondary | ICD-10-CM | POA: Insufficient documentation

## 2021-05-15 DIAGNOSIS — Z5112 Encounter for antineoplastic immunotherapy: Secondary | ICD-10-CM | POA: Insufficient documentation

## 2021-05-15 DIAGNOSIS — Z7189 Other specified counseling: Secondary | ICD-10-CM

## 2021-05-15 LAB — CBC WITH DIFFERENTIAL/PLATELET
Abs Immature Granulocytes: 0.02 10*3/uL (ref 0.00–0.07)
Basophils Absolute: 0 10*3/uL (ref 0.0–0.1)
Basophils Relative: 0 %
Eosinophils Absolute: 0.1 10*3/uL (ref 0.0–0.5)
Eosinophils Relative: 1 %
HCT: 37.7 % — ABNORMAL LOW (ref 39.0–52.0)
Hemoglobin: 12.8 g/dL — ABNORMAL LOW (ref 13.0–17.0)
Immature Granulocytes: 0 %
Lymphocytes Relative: 8 %
Lymphs Abs: 0.5 10*3/uL — ABNORMAL LOW (ref 0.7–4.0)
MCH: 33.5 pg (ref 26.0–34.0)
MCHC: 34 g/dL (ref 30.0–36.0)
MCV: 98.7 fL (ref 80.0–100.0)
Monocytes Absolute: 0.8 10*3/uL (ref 0.1–1.0)
Monocytes Relative: 12 %
Neutro Abs: 5.1 10*3/uL (ref 1.7–7.7)
Neutrophils Relative %: 79 %
Platelets: 176 10*3/uL (ref 150–400)
RBC: 3.82 MIL/uL — ABNORMAL LOW (ref 4.22–5.81)
RDW: 14.4 % (ref 11.5–15.5)
WBC: 6.5 10*3/uL (ref 4.0–10.5)
nRBC: 0 % (ref 0.0–0.2)

## 2021-05-15 LAB — CMP (CANCER CENTER ONLY)
ALT: 22 U/L (ref 0–44)
AST: 28 U/L (ref 15–41)
Albumin: 3.7 g/dL (ref 3.5–5.0)
Alkaline Phosphatase: 60 U/L (ref 38–126)
Anion gap: 9 (ref 5–15)
BUN: 20 mg/dL (ref 8–23)
CO2: 25 mmol/L (ref 22–32)
Calcium: 9.5 mg/dL (ref 8.9–10.3)
Chloride: 105 mmol/L (ref 98–111)
Creatinine: 0.87 mg/dL (ref 0.61–1.24)
GFR, Estimated: >60 mL/min (ref >60)
Glucose, Bld: 87 mg/dL (ref 70–99)
Potassium: 4.6 mmol/L (ref 3.5–5.1)
Sodium: 139 mmol/L (ref 135–145)
Total Bilirubin: 0.6 mg/dL (ref 0.3–1.2)
Total Protein: 6.9 g/dL (ref 6.5–8.1)

## 2021-05-15 MED ORDER — SODIUM CHLORIDE 0.9 % IV SOLN
200.0000 mg | Freq: Once | INTRAVENOUS | Status: AC
  Administered 2021-05-15: 200 mg via INTRAVENOUS
  Filled 2021-05-15: qty 8

## 2021-05-15 MED ORDER — FAMOTIDINE 20 MG IN NS 100 ML IVPB
20.0000 mg | Freq: Once | INTRAVENOUS | Status: AC
  Administered 2021-05-15: 20 mg via INTRAVENOUS
  Filled 2021-05-15: qty 100

## 2021-05-15 MED ORDER — SODIUM CHLORIDE 0.9% FLUSH
10.0000 mL | INTRAVENOUS | Status: DC | PRN
  Administered 2021-05-15: 10 mL

## 2021-05-15 MED ORDER — HEPARIN SOD (PORK) LOCK FLUSH 100 UNIT/ML IV SOLN
500.0000 [IU] | Freq: Once | INTRAVENOUS | Status: AC | PRN
  Administered 2021-05-15: 500 [IU]

## 2021-05-15 MED ORDER — DIPHENHYDRAMINE HCL 50 MG/ML IJ SOLN
25.0000 mg | Freq: Once | INTRAMUSCULAR | Status: AC
  Administered 2021-05-15: 25 mg via INTRAVENOUS
  Filled 2021-05-15: qty 1

## 2021-05-15 MED ORDER — SODIUM CHLORIDE 0.9 % IV SOLN: Freq: Once | INTRAVENOUS | Status: AC

## 2021-05-15 NOTE — Patient Instructions (Signed)
[facility]   Discharge Instructions: Thank you for choosing [facility] to provide your oncology and hematology care.   If you have a lab appointment with the [facility], please go directly to the [facility] and check in at the registration area.   Wear comfortable clothing and clothing appropriate for easy access to any Portacath or PICC line.   We strive to give you quality time with your provider. You may need to reschedule your appointment if you arrive late (15 or more minutes).  Arriving late affects you and other patients whose appointments are after yours.  Also, if you miss three or more appointments without notifying the office, you may be dismissed from the clinic at the provider's discretion.      For prescription refill requests, have your pharmacy contact our office and allow 72 hours for refills to be completed.    Today you received the following chemotherapy and/or immunotherapy agents: Pembrolizumab Beryle Flock)      To help prevent nausea and vomiting after your treatment, we encourage you to take your nausea medication as directed.  BELOW ARE SYMPTOMS THAT SHOULD BE REPORTED IMMEDIATELY: *FEVER GREATER THAN 100.4 F (38 C) OR HIGHER *CHILLS OR SWEATING *NAUSEA AND VOMITING THAT IS NOT CONTROLLED WITH YOUR NAUSEA MEDICATION *UNUSUAL SHORTNESS OF BREATH *UNUSUAL BRUISING OR BLEEDING *URINARY PROBLEMS (pain or burning when urinating, or frequent urination) *BOWEL PROBLEMS (unusual diarrhea, constipation, pain near the anus) TENDERNESS IN MOUTH AND THROAT WITH OR WITHOUT PRESENCE OF ULCERS (sore throat, sores in mouth, or a toothache) UNUSUAL RASH, SWELLING OR PAIN  UNUSUAL VAGINAL DISCHARGE OR ITCHING   Items with * indicate a potential emergency and should be followed up as soon as possible or go to the Emergency Department if any problems should occur.  Please show the CHEMOTHERAPY ALERT CARD or IMMUNOTHERAPY ALERT  CARD at check-in to the Emergency Department and triage nurse.  Should you have questions after your visit or need to cancel or reschedule your appointment, please contact [facility]  Dept: [phone]  and follow the prompts.  Office hours are 8:00 a.m. to 4:30 p.m. Monday - Friday. Please note that voicemails left after 4:00 p.m. may not be returned until the following business day.  We are closed weekends and major holidays. You have access to a nurse at all times for urgent questions. Please call the main number to the clinic Dept: [phone] and follow the prompts.   For any non-urgent questions, you may also contact your provider using MyChart. We now offer e-Visits for anyone 20 and older to request care online for non-urgent symptoms. For details visit [email or web address].   Also download the MyChart app! Go to the app store, search "MyChart", open the app, select [facility], and log in with your MyChart username and password.  Due to Covid, a mask is required upon entering the hospital/clinic. If you do not have a mask, one will be given to you upon arrival. For doctor visits, patients may have 1 support person aged 19 or older with them. For treatment visits, patients cannot have anyone with them due to current Covid guidelines and our immunocompromised population.

## 2021-05-16 ENCOUNTER — Other Ambulatory Visit: Payer: Self-pay

## 2021-05-16 ENCOUNTER — Telehealth: Payer: Self-pay

## 2021-05-16 DIAGNOSIS — C3492 Malignant neoplasm of unspecified part of left bronchus or lung: Secondary | ICD-10-CM

## 2021-05-16 MED ORDER — FENTANYL 50 MCG/HR TD PT72
1.0000 | MEDICATED_PATCH | TRANSDERMAL | 0 refills | Status: DC
Start: 2021-05-16 — End: 2021-06-13

## 2021-05-16 MED ORDER — MORPHINE SULFATE 15 MG PO TABS: 15.0000 mg | ORAL_TABLET | ORAL | 0 refills | Status: DC | PRN

## 2021-05-16 NOTE — Telephone Encounter (Signed)
Pt's wife called about pt having teeth removed and getting dentures. Informed wife Dr [name] recommended pt stopping xgeva for 1 to 2 months prior to teeth being extracted and 1 to 2 months post as directed by dentist. Dental office can fax this office their request fopr information and fax number given to wife. Information acknowledged and verbalized understanding. Last xgeva dose on 04/24/21.

## 2021-05-28 ENCOUNTER — Telehealth: Payer: Self-pay

## 2021-05-28 NOTE — Telephone Encounter (Signed)
Returned call to pt's wife. Wife gave update on pt's dental appointment and dentures. Pt has consult with oral surgeon in several weeks and wants to discuss having teeth removed / holding xgeva with Dr [name] at next appointment.

## 2021-06-01 ENCOUNTER — Other Ambulatory Visit: Payer: Self-pay | Admitting: Cardiovascular Disease

## 2021-06-03 ENCOUNTER — Telehealth (INDEPENDENT_AMBULATORY_CARE_PROVIDER_SITE_OTHER): Payer: Medicare Other | Admitting: Family Medicine

## 2021-06-03 ENCOUNTER — Encounter: Payer: Self-pay | Admitting: Family Medicine

## 2021-06-03 ENCOUNTER — Other Ambulatory Visit: Payer: Self-pay

## 2021-06-03 ENCOUNTER — Ambulatory Visit (HOSPITAL_BASED_OUTPATIENT_CLINIC_OR_DEPARTMENT_OTHER)
Admission: RE | Admit: 2021-06-03 | Discharge: 2021-06-03 | Disposition: A | Discharge status: Home / Self Care | Payer: Medicare Other | Source: Ambulatory Visit | Attending: Family Medicine | Admitting: Family Medicine

## 2021-06-03 DIAGNOSIS — R059 Cough, unspecified: Secondary | ICD-10-CM

## 2021-06-03 MED ORDER — CEFDINIR 300 MG PO CAPS: 300.0000 mg | ORAL_CAPSULE | Freq: Two times a day (BID) | ORAL | 0 refills | Status: DC

## 2021-06-03 MED ORDER — PREDNISONE 20 MG PO TABS: ORAL_TABLET | ORAL | 0 refills | Status: DC

## 2021-06-03 NOTE — Progress Notes (Signed)
[facility] [address], [place], [place] [address] [phone] [id number]  Date:  06/03/2021   Name:  Kyle Foster   DOB:  09/06/1946   MRN:  [id number]  PCP:  [name], NP    Chief Complaint: No chief complaint on file.   History of Present Illness:  Kyle Foster is a 75 y.o. very pleasant male patient who presents with the following:  Virtual visit today for illness I have not seen this patient myself in the past Patient location is home, my location is office Patient identity confirmed with 2 factors, he gives consent for virtual visit today The patient, his wife and myself are present on the call today.  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  He does not have a pulse oximeter handy  We discussed his symptoms in detail.  I offered to have him seen at the emergency room as he does have risk factors detailed above.  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   H/O agent Orange exposure 03/22/2019   Centrilobular emphysema ([facility]) 03/21/2019   Squamous cell lung cancer, left ([facility]) 11/24/2018   Counseling regarding advance care planning and goals of care 11/24/2018   Metastatic cancer ([facility])    Malignant neoplasm of upper lobe of left lung ([facility])    Bone metastases ([facility])    Severe protein-calorie malnutrition ([facility]) 10/15/2018   Palliative care by specialist    Goals of care, counseling/discussion    Cancer associated pain 10/14/2018   CAD S/P percutaneous coronary angioplasty 07/13/2013   Essential hypertension 07/13/2013   Mixed hyperlipidemia 07/13/2013   Tobacco abuse 07/13/2013   Coronary artery disease 07/13/2013   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    Past Medical History:  Diagnosis Date   Allergy    Bladder cancer ([facility]) 10/2008   CAD (coronary artery disease)    Cancer of parotid gland ([facility] [facility]) 12/2009   "squamous cell cancer attached to it; took the gland out"   GERD (gastroesophageal reflux disease)    History of chickenpox  History of kidney stones    Hyperlipidemia    Hypertension    Myocardial infarction ([facility]) 06/2008   Recurrent upper respiratory infection (URI)    09/01/11 saw PCP - Kathryne Eriksson , antibiotic  and prednisone    Skin cancer    "cut & burned off arms, hands, face, neck" (06/14/2018)   Squamous cell carcinoma of lung ([facility]) dx'd 10/2018   chemo.xrt. immunotherapy    Past Surgical History:  Procedure Laterality Date   CATARACT EXTRACTION W/ INTRAOCULAR LENS IMPLANT Right 12/2007   CORONARY ANGIOPLASTY WITH STENT PLACEMENT  06/2008   "3 stents" (06/14/2018)   CYSTOSCOPY W/ RETROGRADES  09/22/2011    Procedure: CYSTOSCOPY WITH RETROGRADE PYELOGRAM;  Surgeon: Bernestine Amass, MD;  Location: [facility];  Service: Urology;  Laterality: Left;  Cystoscopy left Retrograde Pyelogram      (c-arm)    CYSTOSCOPY WITH BIOPSY  09/22/2011   Procedure: CYSTOSCOPY WITH BIOPSY;  Surgeon: Bernestine Amass, MD;  Location: [facility];  Service: Urology;  Laterality: N/A;   Biopsy   CYSTOSCOPY WITH BIOPSY N/A 04/19/2021   Procedure: CYSTOSCOPY WITH BLADDER  BIOPSY WITH FULGERATION;  Surgeon: [name], MD;  Location: [facility];  Service: Urology;  Laterality: N/A;   EXCISIONAL HEMORRHOIDECTOMY  ~ 2006   EYE SURGERY Left 04/2011   "reconstruction; gold weight in eye lid " (06/14/2018)   FOOT NEUROMA SURGERY Left    immunotherapy     INGUINAL HERNIA REPAIR Right 02/2018   IR IMAGING GUIDED PORT INSERTION  11/23/2018   NM MYOCAR PERF WALL MOTION  07/11/2008   MILD ISCHEMIA IN THE BASL INFERIOR, MID INFERIOR & APICAL INFERIOR REGIONS   SALIVARY GLAND SURGERY Left 04/2011   "squamous cell cancer attached to it; took the gland out"   SKIN CANCER EXCISION     "arms, hands, face, neck" (06/14/2018)   TRANSURETHRAL RESECTION OF BLADDER TUMOR WITH GYRUS (TURBT-GYRUS)  2010    Social History   Tobacco Use   Smoking status: Every Day    Packs/day: 0.50    Years: 52.00    Pack years: 26.00    Types: Cigarettes    Start date: 1968   Smokeless tobacco: Never   Tobacco comments:    03/21/19- smoking 10 cigs a day   Vaping Use   Vaping Use: Never used  Substance Use Topics   Alcohol use: Never   Drug use: Never    Family History  Problem Relation Age of Onset   Heart attack Mother 54   Cancer Mother        Lung   Diabetes Mother    Heart disease Mother    Hypertension Mother    Stroke Father 61   Cancer Father        Lung   Brain cancer Brother    Cancer Sister        Melanoma of great Toe   Hyperlipidemia Sister     Allergies  Allergen Reactions   Codeine Other (See Comments)     HEADACHE  headaches    Medication list has been reviewed and updated.  Current Outpatient Medications on File Prior to Visit  Medication Sig Dispense Refill   acetaminophen (TYLENOL) 500 MG tablet Take 1,000 mg by mouth every 8 (eight) hours as needed for moderate pain.     atorvastatin (LIPITOR) 80 MG tablet TAKE 1 TABLET (80 MG TOTAL) BY MOUTH DAILY. NEEDS APPOINTMENT FOR FUTURE REFILLS 90 tablet 3   B Complex-C (B-COMPLEX WITH  VITAMIN C) tablet Take 1 tablet by mouth daily.     calcium carbonate (TUMS - DOSED IN MG ELEMENTAL CALCIUM) 500 MG chewable tablet Chew 1 tablet (200 mg of elemental calcium total) by mouth 3 (three) times daily with meals. (Patient taking differently: Chew 1 tablet by mouth 3 (three) times daily as needed for indigestion.) 30 tablet 3   Cholecalciferol (VITAMIN D) 2000 UNITS tablet Take 2,000 Units by mouth daily.     clopidogrel (PLAVIX) 75 MG tablet Take 1 tablet (75 mg total) by mouth daily. NEEDS APPOINTMENT FOR FUTURE REFILLS 90 tablet 3   DENTA 5000 PLUS 1.1 % CREA dental cream Place 1 application onto teeth at bedtime.     DULoxetine (CYMBALTA) 30 MG capsule TAKE 1 CAPSULE BY MOUTH EVERY DAY (Patient taking differently: Take 30 mg by mouth daily. TAKE 1 CAPSULE BY MOUTH EVERY DAY) 90 capsule 2   fentaNYL (DURAGESIC) 50 MCG/HR Place 1 patch onto the skin every 3 (three) days. 10 patch 0   Hypromellose (ARTIFICIAL TEARS OP) Place 1 drop into both eyes at bedtime.      lidocaine-prilocaine (EMLA) cream Apply 1 application topically as needed (access port). 30 g 3   loratadine (CLARITIN) 10 MG tablet Take 10 mg by mouth daily as needed for allergies.     magnesium oxide (MAG-OX) 400 MG tablet Take 1 tablet (400 mg total) by mouth daily. 90 tablet 0   metoprolol tartrate (LOPRESSOR) 25 MG tablet TAKE 1 TABLET (25 MG TOTAL) BY MOUTH 2 (TWO) TIMES DAILY. NEEDS APPOINTMENT FOR FUTURE REFILLS 180 tablet 3   morphine (MSIR) 15 MG tablet Take 1-2 tablets (15-30 mg  total) by mouth every 4 (four) hours as needed for moderate pain or severe pain. 120 tablet 0   ondansetron (ZOFRAN) 8 MG tablet Take 1 tablet (8 mg total) by mouth every 8 (eight) hours as needed for nausea or vomiting. 30 tablet 2   pantoprazole (PROTONIX) 40 MG tablet TAKE 1 TABLET BY MOUTH EVERY DAY (Patient taking differently: Take 40 mg by mouth daily.) 90 tablet 3   No current facility-administered medications on file prior to visit.    Review of Systems:  As per HPI- otherwise negative.   Physical Examination: There were no vitals filed for this visit. There were no vitals filed for this visit. There is no height or weight on file to calculate BMI. Ideal Body Weight:    Spoke with patient on the phone today.  No distress, no wheezing, no cough noted  Assessment and Plan: Cough - Plan: DG Chest 2 View, cefdinir (OMNICEF) 300 MG capsule, predniSONE (DELTASONE) 20 MG tablet  We discussed his symptoms in detail.  I suggested that we have him seen at the emergency room as he does have risk factors for more serious illness detailed above.  At this time he declines, he feels as though this is similar to previous illnesses he has had  As such, we plan to start antibiotics, prednisone, and obtain a chest film  He does agree to seek emergency care if he is getting worse in any way.  I also advised him to purchase an over-the-counter pulse oximeter so he can monitor his oxygenation level Telephone visit- spoke for 15 minutes today  Signed Lamar Blinks, MD  Received chest film as below, message to patient  DG Chest 2 View  Result Date: 06/03/2021 CLINICAL DATA:  Cough and shortness of breath. EXAM: CHEST - 2 VIEW COMPARISON:  02/19/2020 FINDINGS: Right  chest wall port a catheter is noted with tip in the projection of the cavoatrial junction. Heart size normal. Lungs are hyperinflated with coarsened interstitial markings suggestive of emphysema. Stable appearance of scarring,  architectural distortion and volume loss within the suprahilar left upper lung compatible with post treatment changes. No superimposed pleural effusion, airspace consolidation or atelectasis. IMPRESSION: 1. No active cardiopulmonary abnormalities. 2. Emphysema. Electronically Signed   By: Kerby Moors M.D.   On: 06/03/2021 15:46

## 2021-06-04 ENCOUNTER — Telehealth: Payer: Medicare Other | Admitting: Registered Nurse

## 2021-06-05 ENCOUNTER — Inpatient Hospital Stay: Payer: No Typology Code available for payment source

## 2021-06-05 ENCOUNTER — Ambulatory Visit: Payer: No Typology Code available for payment source

## 2021-06-05 ENCOUNTER — Other Ambulatory Visit: Payer: Self-pay

## 2021-06-05 ENCOUNTER — Inpatient Hospital Stay (HOSPITAL_BASED_OUTPATIENT_CLINIC_OR_DEPARTMENT_OTHER): Payer: No Typology Code available for payment source | Admitting: Hematology

## 2021-06-05 VITALS — BP 114/64 | HR 53 | Temp 97.6°F | Resp 17 | Wt 124.0 lb

## 2021-06-05 DIAGNOSIS — C3492 Malignant neoplasm of unspecified part of left bronchus or lung: Secondary | ICD-10-CM

## 2021-06-05 DIAGNOSIS — C3412 Malignant neoplasm of upper lobe, left bronchus or lung: Secondary | ICD-10-CM

## 2021-06-05 DIAGNOSIS — Z7189 Other specified counseling: Secondary | ICD-10-CM

## 2021-06-05 DIAGNOSIS — C7951 Secondary malignant neoplasm of bone: Secondary | ICD-10-CM

## 2021-06-05 DIAGNOSIS — Z5112 Encounter for antineoplastic immunotherapy: Secondary | ICD-10-CM | POA: Diagnosis not present

## 2021-06-05 LAB — CBC WITH DIFFERENTIAL/PLATELET
Abs Immature Granulocytes: 0.04 10*3/uL (ref 0.00–0.07)
Basophils Absolute: 0 10*3/uL (ref 0.0–0.1)
Basophils Relative: 0 %
Eosinophils Absolute: 0.1 10*3/uL (ref 0.0–0.5)
Eosinophils Relative: 1 %
HCT: 36.8 % — ABNORMAL LOW (ref 39.0–52.0)
Hemoglobin: 12.2 g/dL — ABNORMAL LOW (ref 13.0–17.0)
Immature Granulocytes: 0 %
Lymphocytes Relative: 5 %
Lymphs Abs: 0.6 10*3/uL — ABNORMAL LOW (ref 0.7–4.0)
MCH: 32.8 pg (ref 26.0–34.0)
MCHC: 33.2 g/dL (ref 30.0–36.0)
MCV: 98.9 fL (ref 80.0–100.0)
Monocytes Absolute: 0.8 10*3/uL (ref 0.1–1.0)
Monocytes Relative: 7 %
Neutro Abs: 10.4 10*3/uL — ABNORMAL HIGH (ref 1.7–7.7)
Neutrophils Relative %: 87 %
Platelets: 163 10*3/uL (ref 150–400)
RBC: 3.72 MIL/uL — ABNORMAL LOW (ref 4.22–5.81)
RDW: 13.9 % (ref 11.5–15.5)
WBC: 11.9 10*3/uL — ABNORMAL HIGH (ref 4.0–10.5)
nRBC: 0 % (ref 0.0–0.2)

## 2021-06-05 LAB — CMP (CANCER CENTER ONLY)
ALT: 53 U/L — ABNORMAL HIGH (ref 0–44)
AST: 35 U/L (ref 15–41)
Albumin: 3.7 g/dL (ref 3.5–5.0)
Alkaline Phosphatase: 61 U/L (ref 38–126)
Anion gap: 9 (ref 5–15)
BUN: 25 mg/dL — ABNORMAL HIGH (ref 8–23)
CO2: 24 mmol/L (ref 22–32)
Calcium: 9.4 mg/dL (ref 8.9–10.3)
Chloride: 104 mmol/L (ref 98–111)
Creatinine: 0.82 mg/dL (ref 0.61–1.24)
GFR, Estimated: >60 mL/min (ref >60)
Glucose, Bld: 102 mg/dL — ABNORMAL HIGH (ref 70–99)
Potassium: 4.6 mmol/L (ref 3.5–5.1)
Sodium: 137 mmol/L (ref 135–145)
Total Bilirubin: 0.5 mg/dL (ref 0.3–1.2)
Total Protein: 6.7 g/dL (ref 6.5–8.1)

## 2021-06-05 MED ORDER — SODIUM CHLORIDE 0.9% FLUSH
10.0000 mL | INTRAVENOUS | Status: DC | PRN
  Administered 2021-06-05: 10 mL

## 2021-06-05 MED ORDER — DIPHENHYDRAMINE HCL 50 MG/ML IJ SOLN
25.0000 mg | Freq: Once | INTRAMUSCULAR | Status: AC
Start: 2021-06-05 — End: 2021-06-05
  Administered 2021-06-05: 25 mg via INTRAVENOUS
  Filled 2021-06-05: qty 1

## 2021-06-05 MED ORDER — SODIUM CHLORIDE 0.9 % IV SOLN: Freq: Once | INTRAVENOUS | Status: AC

## 2021-06-05 MED ORDER — SODIUM CHLORIDE 0.9 % IV SOLN
200.0000 mg | Freq: Once | INTRAVENOUS | Status: AC
  Administered 2021-06-05: 200 mg via INTRAVENOUS
  Filled 2021-06-05: qty 8

## 2021-06-05 MED ORDER — FAMOTIDINE 20 MG IN NS 100 ML IVPB
20.0000 mg | Freq: Once | INTRAVENOUS | Status: AC
  Administered 2021-06-05: 20 mg via INTRAVENOUS
  Filled 2021-06-05: qty 100

## 2021-06-05 MED ORDER — HEPARIN SOD (PORK) LOCK FLUSH 100 UNIT/ML IV SOLN
500.0000 [IU] | Freq: Once | INTRAVENOUS | Status: AC | PRN
  Administered 2021-06-05: 500 [IU]

## 2021-06-05 NOTE — Patient Instructions (Signed)
[facility]   Discharge Instructions: Thank you for choosing [facility] to provide your oncology and hematology care.   If you have a lab appointment with the [facility], please go directly to the [facility] and check in at the registration area.   Wear comfortable clothing and clothing appropriate for easy access to any Portacath or PICC line.   We strive to give you quality time with your provider. You may need to reschedule your appointment if you arrive late (15 or more minutes).  Arriving late affects you and other patients whose appointments are after yours.  Also, if you miss three or more appointments without notifying the office, you may be dismissed from the clinic at the provider's discretion.      For prescription refill requests, have your pharmacy contact our office and allow 72 hours for refills to be completed.    Today you received the following chemotherapy and/or immunotherapy agents: pembrolizumab.      To help prevent nausea and vomiting after your treatment, we encourage you to take your nausea medication as directed.  BELOW ARE SYMPTOMS THAT SHOULD BE REPORTED IMMEDIATELY: *FEVER GREATER THAN 100.4 F (38 C) OR HIGHER *CHILLS OR SWEATING *NAUSEA AND VOMITING THAT IS NOT CONTROLLED WITH YOUR NAUSEA MEDICATION *UNUSUAL SHORTNESS OF BREATH *UNUSUAL BRUISING OR BLEEDING *URINARY PROBLEMS (pain or burning when urinating, or frequent urination) *BOWEL PROBLEMS (unusual diarrhea, constipation, pain near the anus) TENDERNESS IN MOUTH AND THROAT WITH OR WITHOUT PRESENCE OF ULCERS (sore throat, sores in mouth, or a toothache) UNUSUAL RASH, SWELLING OR PAIN  UNUSUAL VAGINAL DISCHARGE OR ITCHING   Items with * indicate a potential emergency and should be followed up as soon as possible or go to the Emergency Department if any problems should occur.  Please show the CHEMOTHERAPY ALERT CARD or IMMUNOTHERAPY ALERT CARD at  check-in to the Emergency Department and triage nurse.  Should you have questions after your visit or need to cancel or reschedule your appointment, please contact [facility]  Dept: [phone]  and follow the prompts.  Office hours are 8:00 a.m. to 4:30 p.m. Monday - Friday. Please note that voicemails left after 4:00 p.m. may not be returned until the following business day.  We are closed weekends and major holidays. You have access to a nurse at all times for urgent questions. Please call the main number to the clinic Dept: [phone] and follow the prompts.   For any non-urgent questions, you may also contact your provider using MyChart. We now offer e-Visits for anyone 2 and older to request care online for non-urgent symptoms. For details visit [email or web address].   Also download the MyChart app! Go to the app store, search "MyChart", open the app, select [facility], and log in with your MyChart username and password.  Due to Covid, a mask is required upon entering the hospital/clinic. If you do not have a mask, one will be given to you upon arrival. For doctor visits, patients may have 1 support person aged 22 or older with them. For treatment visits, patients cannot have anyone with them due to current Covid guidelines and our immunocompromised population.

## 2021-06-11 ENCOUNTER — Encounter: Payer: Self-pay | Admitting: Hematology

## 2021-06-11 DIAGNOSIS — Z23 Encounter for immunization: Secondary | ICD-10-CM | POA: Diagnosis not present
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
## 2021-06-13 ENCOUNTER — Other Ambulatory Visit: Payer: Self-pay

## 2021-06-13 DIAGNOSIS — C3492 Malignant neoplasm of unspecified part of left bronchus or lung: Secondary | ICD-10-CM

## 2021-06-14 ENCOUNTER — Encounter: Payer: Self-pay | Admitting: Hematology

## 2021-06-14 MED ORDER — MORPHINE SULFATE 15 MG PO TABS: 15.0000 mg | ORAL_TABLET | ORAL | 0 refills | Status: DC | PRN

## 2021-06-14 MED ORDER — FENTANYL 50 MCG/HR TD PT72: 1.0000 | MEDICATED_PATCH | TRANSDERMAL | 0 refills | Status: DC

## 2021-06-14 MED ORDER — ONDANSETRON HCL 8 MG PO TABS: 8.0000 mg | ORAL_TABLET | Freq: Three times a day (TID) | ORAL | 2 refills | Status: DC | PRN

## 2021-06-23 NOTE — Progress Notes (Signed)
Established Patient Office Visit  Subjective:  Patient ID: Kyle Foster, male    DOB: 1945-11-21  Age: 75 y.o. MRN: [id number]  CC:  Chief Complaint  Patient presents with   Transitions Of Care    Patient states he is here for a TOC. Patient states he as had his physical for the year.    HPI Kyle Foster presents for visit to est care.  Has already completed annual physical exam.  Hx of bladder ca Stable. No issues. Follows with urology.  CAD, rbbb, htn, hx of MI Follows with Dr. Gwenlyn Found. Stable. No CV symptoms today.  Lung Ca In treatment with Dr. [name]. No new symptoms or concerns.  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 immunotherapy    Past Surgical History:  Procedure Laterality Date   CATARACT EXTRACTION W/ INTRAOCULAR LENS IMPLANT Right 12/2007   CORONARY ANGIOPLASTY WITH STENT PLACEMENT  06/2008   "3 stents" (06/14/2018)   CYSTOSCOPY W/ RETROGRADES  09/22/2011   Procedure: CYSTOSCOPY WITH RETROGRADE PYELOGRAM;  Surgeon: Bernestine Amass, MD;  Location: [facility];  Service: Urology;  Laterality: Left;  Cystoscopy left Retrograde Pyelogram      (c-arm)    CYSTOSCOPY WITH BIOPSY  09/22/2011   Procedure: CYSTOSCOPY WITH BIOPSY;  Surgeon: Bernestine Amass, MD;  [facility];  Service: Urology;   Laterality: N/A;   Biopsy   CYSTOSCOPY WITH BIOPSY N/A 04/19/2021   Procedure: CYSTOSCOPY WITH BLADDER  BIOPSY WITH FULGERATION;  Surgeon: [name], MD;  [facility];  Service: Urology;  Laterality: N/A;   EXCISIONAL HEMORRHOIDECTOMY  ~ 2006   EYE SURGERY Left 04/2011   "reconstruction; gold weight in eye lid " (06/14/2018)   FOOT NEUROMA SURGERY Left    immunotherapy     INGUINAL HERNIA REPAIR Right 02/2018   IR IMAGING GUIDED PORT INSERTION  11/23/2018   NM MYOCAR PERF WALL MOTION  07/11/2008   MILD ISCHEMIA IN THE BASL INFERIOR, MID INFERIOR & APICAL INFERIOR REGIONS   SALIVARY GLAND SURGERY Left 04/2011   "squamous cell cancer attached to it; took the gland out"   SKIN CANCER EXCISION     "arms, hands, face, neck" (06/14/2018)   TRANSURETHRAL RESECTION OF BLADDER TUMOR WITH GYRUS (TURBT-GYRUS)  2010    Family History  Problem Relation Age of Onset   Heart attack Mother 24   Cancer Mother        Lung   Diabetes Mother    Heart disease Mother    Hypertension Mother    Stroke Father 15   Cancer Father        Lung   Brain cancer Brother    Cancer Sister  Melanoma of great Toe   Hyperlipidemia Sister     Social History   Socioeconomic History   Marital status: Married    Spouse name: Not on file   Number of children: Not on file   Years of education: Not on file   Highest education level: Not on file  Occupational History   Not on file  Tobacco Use   Smoking status: Every Day    Packs/day: 0.50    Years: 52.00    Pack years: 26.00    Types: Cigarettes    Start date: 1968   Smokeless tobacco: Never   Tobacco comments:    03/21/19- smoking 10 cigs a day   Vaping Use   Vaping Use: Never used  Substance and Sexual Activity   Alcohol use: Never   Drug use: Never   Sexual activity: Not on file  Other Topics Concern   Not on file  Social History Narrative   Not on file   Social Determinants of Health   Financial Resource Strain: Not on file   Food Insecurity: Not on file  Transportation Needs: Not on file  Physical Activity: Not on file  Stress: Not on file  Social Connections: Not on file  Intimate Partner Violence: Not on file    Outpatient Medications Prior to Visit  Medication Sig Dispense Refill   calcium carbonate (TUMS - DOSED IN MG ELEMENTAL CALCIUM) 500 MG chewable tablet Chew 1 tablet (200 mg of elemental calcium total) by mouth 3 (three) times daily with meals. (Patient taking differently: Chew 1 tablet by mouth 3 (three) times daily as needed for indigestion.) 30 tablet 3   Cholecalciferol (VITAMIN D) 2000 UNITS tablet Take 2,000 Units by mouth daily.     DENTA 5000 PLUS 1.1 % CREA dental cream Place 1 application onto teeth at bedtime.     DULoxetine (CYMBALTA) 30 MG capsule TAKE 1 CAPSULE BY MOUTH EVERY DAY (Patient taking differently: Take 30 mg by mouth daily. TAKE 1 CAPSULE BY MOUTH EVERY DAY) 90 capsule 2   Hypromellose (ARTIFICIAL TEARS OP) Place 1 drop into both eyes at bedtime.      lidocaine-prilocaine (EMLA) cream Apply 1 application topically as needed (access port). 30 g 3   magnesium oxide (MAG-OX) 400 MG tablet Take 1 tablet (400 mg total) by mouth daily. 90 tablet 0   atorvastatin (LIPITOR) 80 MG tablet TAKE 1 TABLET (80 MG TOTAL) BY MOUTH DAILY. NEEDS APPOINTMENT FOR FUTURE REFILLS 90 tablet 3   clopidogrel (PLAVIX) 75 MG tablet TAKE 1 TABLET (75 MG TOTAL) BY MOUTH DAILY. NEEDS APPOINTMENT FOR FUTURE REFILLS 90 tablet 3   dronabinol (MARINOL) 5 MG capsule Take 1 capsule (5 mg total) by mouth 2 (two) times daily before lunch and supper. 60 capsule 0   fentaNYL (DURAGESIC) 50 MCG/HR Place 1 patch onto the skin every 3 (three) days. 10 patch 0   fluoruracil (CARAC) 0.5 % cream Fluorouracil 5%+ Calcipotriene 0.005%     LORazepam (ATIVAN) 0.5 MG tablet Take 1 tablet (0.5 mg total) by mouth every 6 (six) hours as needed (Nausea or vomiting). (Patient not taking: Reported on 04/08/2021) 30 tablet 0    metoprolol tartrate (LOPRESSOR) 25 MG tablet TAKE 1 TABLET (25 MG TOTAL) BY MOUTH 2 (TWO) TIMES DAILY. NEEDS APPOINTMENT FOR FUTURE REFILLS 180 tablet 3   morphine (MSIR) 15 MG tablet Take 1-2 tablets (15-30 mg total) by mouth every 4 (four) hours as needed for moderate pain or severe pain. 120 tablet  0   ondansetron (ZOFRAN) 8 MG tablet Take 1 tablet (8 mg total) by mouth every 8 (eight) hours as needed for nausea or vomiting. 30 tablet 2   pantoprazole (PROTONIX) 40 MG tablet TAKE 1 TABLET BY MOUTH EVERY DAY 90 tablet 3   TRELEGY ELLIPTA 100-62.5-25 MCG/INH AEPB INHALE 1 PUFF BY MOUTH EVERY DAY (Patient taking differently: Inhale 1 puff into the lungs daily.) 60 each 0   vitamin B-12 (CYANOCOBALAMIN) 100 MCG tablet Take 100 mcg by mouth in the morning and at bedtime.     White Petrolatum-Mineral Oil (TEARS AGAIN) OINT Apply to eye.     No facility-administered medications prior to visit.    Allergies  Allergen Reactions   Codeine Other (See Comments)    HEADACHE  headaches    ROS Review of Systems  Constitutional: Negative.   HENT: Negative.    Eyes: Negative.   Respiratory: Negative.    Cardiovascular: Negative.   Gastrointestinal: Negative.   Genitourinary: Negative.   Musculoskeletal: Negative.   Skin: Negative.   Neurological: Negative.   Psychiatric/Behavioral: Negative.    All other systems reviewed and are negative.    Objective:    Physical Exam Constitutional:      General: He is not in acute distress.    Appearance: Normal appearance. He is normal weight. He is not ill-appearing, toxic-appearing or diaphoretic.  Cardiovascular:     Rate and Rhythm: Normal rate and regular rhythm.     Heart sounds: Normal heart sounds. No murmur heard.   No friction rub. No gallop.  Pulmonary:     Effort: Pulmonary effort is normal. No respiratory distress.     Breath sounds: Normal breath sounds. No stridor. No wheezing, rhonchi or rales.  Chest:     Chest wall: No  tenderness.  Neurological:     General: No focal deficit present.     Mental Status: He is alert and oriented to person, place, and time. Mental status is at baseline.  Psychiatric:        Mood and Affect: Mood normal.        Behavior: Behavior normal.        Thought Content: Thought content normal.        Judgment: Judgment normal.    BP 105/63   Pulse 78   Temp 98.5 F (36.9 C) (Temporal)   Resp 18   Ht 5\' 10"  (1.778 m)   Wt 121 lb 12.8 oz (55.2 kg)   SpO2 99%   BMI 17.48 kg/m  Wt Readings from Last 3 Encounters:  06/05/21 124 lb (56.2 kg)  05/15/21 121 lb (54.9 kg)  04/24/21 124 lb 8 oz (56.5 kg)     Health Maintenance Due  Topic Date Due   COVID-19 Vaccine (3 - Moderna risk series) 12/19/2019    There are no preventive care reminders to display for this patient.  Lab Results  Component Value Date   TSH 2.621 04/24/2021   Lab Results  Component Value Date   WBC 11.9 (H) 06/05/2021   HGB 12.2 (L) 06/05/2021   HCT 36.8 (L) 06/05/2021   MCV 98.9 06/05/2021   PLT 163 06/05/2021   Lab Results  Component Value Date   NA 137 06/05/2021   K 4.6 06/05/2021   CO2 24 06/05/2021   GLUCOSE 102 (H) 06/05/2021   BUN 25 (H) 06/05/2021   CREATININE 0.82 06/05/2021   BILITOT 0.5 06/05/2021   ALKPHOS 61 06/05/2021   AST 35 06/05/2021  ALT 53 (H) 06/05/2021   PROT 6.7 06/05/2021   ALBUMIN 3.7 06/05/2021   CALCIUM 9.4 06/05/2021   ANIONGAP 9 06/05/2021   Lab Results  Component Value Date   CHOL 150 05/08/2020   Lab Results  Component Value Date   HDL 67 05/08/2020   Lab Results  Component Value Date   LDLCALC 73 05/08/2020   Lab Results  Component Value Date   TRIG 45 05/08/2020   Lab Results  Component Value Date   CHOLHDL 2.2 05/08/2020   No results found for: HGBA1C    Assessment & Plan:   Problem List Items Addressed This Visit       Cardiovascular and Mediastinum   Essential hypertension   RBBB with left anterior fascicular block -  Primary     Respiratory   Squamous cell lung cancer, left ([place])     Musculoskeletal and Integument   Bone metastases ([place])     Other   Metastatic cancer ([place])   H/O agent Orange exposure   Other Visit Diagnoses     Encounter to establish care           No orders of the defined types were placed in this encounter.   Follow-up: Return in about 6 months (around 09/22/2021) for Chronic conditions.   PLAN Reviewed recent labs with patient Discussed treatment plan Return in 6 mo for check on chronic conditions, sooner with concerns Patient encouraged to call clinic with any questions, comments, or concerns.  [name], NP

## 2021-06-26 ENCOUNTER — Other Ambulatory Visit: Payer: Self-pay | Admitting: Hematology

## 2021-06-26 ENCOUNTER — Other Ambulatory Visit: Payer: Self-pay

## 2021-06-26 ENCOUNTER — Inpatient Hospital Stay: Payer: No Typology Code available for payment source | Attending: Hematology

## 2021-06-26 ENCOUNTER — Other Ambulatory Visit: Payer: Self-pay | Admitting: Cardiovascular Disease

## 2021-06-26 ENCOUNTER — Inpatient Hospital Stay: Payer: No Typology Code available for payment source

## 2021-06-26 VITALS — BP 141/65 | HR 52 | Temp 98.6°F | Resp 14 | Wt 124.0 lb

## 2021-06-26 DIAGNOSIS — C7951 Secondary malignant neoplasm of bone: Secondary | ICD-10-CM

## 2021-06-26 DIAGNOSIS — Z79899 Other long term (current) drug therapy: Secondary | ICD-10-CM | POA: Diagnosis not present

## 2021-06-26 DIAGNOSIS — C3412 Malignant neoplasm of upper lobe, left bronchus or lung: Secondary | ICD-10-CM | POA: Diagnosis present

## 2021-06-26 DIAGNOSIS — C3492 Malignant neoplasm of unspecified part of left bronchus or lung: Secondary | ICD-10-CM

## 2021-06-26 DIAGNOSIS — Z5112 Encounter for antineoplastic immunotherapy: Secondary | ICD-10-CM | POA: Insufficient documentation

## 2021-06-26 DIAGNOSIS — Z7189 Other specified counseling: Secondary | ICD-10-CM

## 2021-06-26 DIAGNOSIS — Z95828 Presence of other vascular implants and grafts: Secondary | ICD-10-CM

## 2021-06-26 LAB — CBC WITH DIFFERENTIAL/PLATELET
Abs Immature Granulocytes: 0.01 10*3/uL (ref 0.00–0.07)
Basophils Absolute: 0 10*3/uL (ref 0.0–0.1)
Basophils Relative: 0 %
Eosinophils Absolute: 0.2 10*3/uL (ref 0.0–0.5)
Eosinophils Relative: 3 %
HCT: 36.9 % — ABNORMAL LOW (ref 39.0–52.0)
Hemoglobin: 12.5 g/dL — ABNORMAL LOW (ref 13.0–17.0)
Immature Granulocytes: 0 %
Lymphocytes Relative: 9 %
Lymphs Abs: 0.6 10*3/uL — ABNORMAL LOW (ref 0.7–4.0)
MCH: 33.2 pg (ref 26.0–34.0)
MCHC: 33.9 g/dL (ref 30.0–36.0)
MCV: 97.9 fL (ref 80.0–100.0)
Monocytes Absolute: 0.5 10*3/uL (ref 0.1–1.0)
Monocytes Relative: 7 %
Neutro Abs: 5.6 10*3/uL (ref 1.7–7.7)
Neutrophils Relative %: 81 %
Platelets: 141 10*3/uL — ABNORMAL LOW (ref 150–400)
RBC: 3.77 MIL/uL — ABNORMAL LOW (ref 4.22–5.81)
RDW: 13.4 % (ref 11.5–15.5)
WBC: 7 10*3/uL (ref 4.0–10.5)
nRBC: 0 % (ref 0.0–0.2)

## 2021-06-26 LAB — CMP (CANCER CENTER ONLY)
ALT: 25 U/L (ref 0–44)
AST: 31 U/L (ref 15–41)
Albumin: 3.4 g/dL — ABNORMAL LOW (ref 3.5–5.0)
Alkaline Phosphatase: 49 U/L (ref 38–126)
Anion gap: 8 (ref 5–15)
BUN: 18 mg/dL (ref 8–23)
CO2: 25 mmol/L (ref 22–32)
Calcium: 9.1 mg/dL (ref 8.9–10.3)
Chloride: 105 mmol/L (ref 98–111)
Creatinine: 0.79 mg/dL (ref 0.61–1.24)
GFR, Estimated: >60 mL/min (ref >60)
Glucose, Bld: 82 mg/dL (ref 70–99)
Potassium: 4.8 mmol/L (ref 3.5–5.1)
Sodium: 138 mmol/L (ref 135–145)
Total Bilirubin: 0.6 mg/dL (ref 0.3–1.2)
Total Protein: 6.6 g/dL (ref 6.5–8.1)

## 2021-06-26 MED ORDER — SODIUM CHLORIDE 0.9% FLUSH
10.0000 mL | INTRAVENOUS | Status: AC | PRN
  Administered 2021-06-26: 10 mL

## 2021-06-26 MED ORDER — SODIUM CHLORIDE 0.9% FLUSH
10.0000 mL | INTRAVENOUS | Status: DC | PRN
  Administered 2021-06-26: 10 mL

## 2021-06-26 MED ORDER — SODIUM CHLORIDE 0.9 % IV SOLN: Freq: Once | INTRAVENOUS | Status: AC

## 2021-06-26 MED ORDER — HEPARIN SOD (PORK) LOCK FLUSH 100 UNIT/ML IV SOLN
500.0000 [IU] | Freq: Once | INTRAVENOUS | Status: AC | PRN
  Administered 2021-06-26: 500 [IU]

## 2021-06-26 MED ORDER — SODIUM CHLORIDE 0.9 % IV SOLN
200.0000 mg | Freq: Once | INTRAVENOUS | Status: AC
  Administered 2021-06-26: 200 mg via INTRAVENOUS
  Filled 2021-06-26: qty 8

## 2021-06-26 MED ORDER — DIPHENHYDRAMINE HCL 50 MG/ML IJ SOLN
25.0000 mg | Freq: Once | INTRAMUSCULAR | Status: AC
  Administered 2021-06-26: 25 mg via INTRAVENOUS
  Filled 2021-06-26: qty 1

## 2021-06-26 MED ORDER — FAMOTIDINE 20 MG IN NS 100 ML IVPB
20.0000 mg | Freq: Once | INTRAVENOUS | Status: AC
  Administered 2021-06-26: 20 mg via INTRAVENOUS
  Filled 2021-06-26: qty 100

## 2021-06-26 NOTE — Patient Instructions (Signed)
[facility]   Discharge Instructions: Thank you for choosing [facility] to provide your oncology and hematology care.   If you have a lab appointment with the [facility], please go directly to the [facility] and check in at the registration area.   Wear comfortable clothing and clothing appropriate for easy access to any Portacath or PICC line.   We strive to give you quality time with your provider. You may need to reschedule your appointment if you arrive late (15 or more minutes).  Arriving late affects you and other patients whose appointments are after yours.  Also, if you miss three or more appointments without notifying the office, you may be dismissed from the clinic at the provider's discretion.      For prescription refill requests, have your pharmacy contact our office and allow 72 hours for refills to be completed.    Today you received the following chemotherapy and/or immunotherapy agents: pembrolizumab.      To help prevent nausea and vomiting after your treatment, we encourage you to take your nausea medication as directed.  BELOW ARE SYMPTOMS THAT SHOULD BE REPORTED IMMEDIATELY: *FEVER GREATER THAN 100.4 F (38 C) OR HIGHER *CHILLS OR SWEATING *NAUSEA AND VOMITING THAT IS NOT CONTROLLED WITH YOUR NAUSEA MEDICATION *UNUSUAL SHORTNESS OF BREATH *UNUSUAL BRUISING OR BLEEDING *URINARY PROBLEMS (pain or burning when urinating, or frequent urination) *BOWEL PROBLEMS (unusual diarrhea, constipation, pain near the anus) TENDERNESS IN MOUTH AND THROAT WITH OR WITHOUT PRESENCE OF ULCERS (sore throat, sores in mouth, or a toothache) UNUSUAL RASH, SWELLING OR PAIN  UNUSUAL VAGINAL DISCHARGE OR ITCHING   Items with * indicate a potential emergency and should be followed up as soon as possible or go to the Emergency Department if any problems should occur.  Please show the CHEMOTHERAPY ALERT CARD or IMMUNOTHERAPY ALERT CARD at  check-in to the Emergency Department and triage nurse.  Should you have questions after your visit or need to cancel or reschedule your appointment, please contact [facility]  Dept: [phone]  and follow the prompts.  Office hours are 8:00 a.m. to 4:30 p.m. Monday - Friday. Please note that voicemails left after 4:00 p.m. may not be returned until the following business day.  We are closed weekends and major holidays. You have access to a nurse at all times for urgent questions. Please call the main number to the clinic Dept: [phone] and follow the prompts.   For any non-urgent questions, you may also contact your provider using MyChart. We now offer e-Visits for anyone 21 and older to request care online for non-urgent symptoms. For details visit [email or web address].   Also download the MyChart app! Go to the app store, search "MyChart", open the app, select [facility], and log in with your MyChart username and password.  Due to Covid, a mask is required upon entering the hospital/clinic. If you do not have a mask, one will be given to you upon arrival. For doctor visits, patients may have 1 support person aged 19 or older with them. For treatment visits, patients cannot have anyone with them due to current Covid guidelines and our immunocompromised population.

## 2021-06-28 ENCOUNTER — Ambulatory Visit (INDEPENDENT_AMBULATORY_CARE_PROVIDER_SITE_OTHER): Payer: Medicare Other | Admitting: Family

## 2021-06-28 ENCOUNTER — Other Ambulatory Visit: Payer: Self-pay

## 2021-06-28 ENCOUNTER — Encounter: Payer: Self-pay | Admitting: Family

## 2021-06-28 ENCOUNTER — Telehealth: Payer: Self-pay | Admitting: Hematology

## 2021-06-28 VITALS — BP 120/76 | HR 95 | Temp 98.7°F | Ht 70.0 in | Wt 119.8 lb

## 2021-06-28 DIAGNOSIS — R058 Other specified cough: Secondary | ICD-10-CM

## 2021-06-28 MED ORDER — PREDNISONE 20 MG PO TABS: ORAL_TABLET | ORAL | 0 refills | Status: DC

## 2021-06-28 NOTE — Patient Instructions (Signed)
It was very nice to see you today!  As discussed, I have sent another round of prednisone to your pharmacy, start this tomorrow and take every morning after breakfast. Continue your over the counter meds for other symptoms and try to drink up to 2 liters of water daily. Call your primary provider or oncologist for ongoing cough or other symptoms.   PLEASE NOTE:  If you had any lab tests please let us know if you have not heard back within a few days. You may see your results on mychart before we have a chance to review them but we will give you a call once they are reviewed by [place]. If we ordered any referrals today, please let us know if you have not heard from their office within the next week.   Please try these tips to maintain a healthy lifestyle:  Eat most of your calories during the day when you are active. Eliminate processed foods including packaged sweets (pies, cakes, cookies), reduce intake of potatoes, white bread, white pasta, and white rice. Look for whole grain options, oat flour or almond flour.  Each meal should contain half fruits/vegetables, one quarter protein, and one quarter carbs (no bigger than a computer mouse).  Cut down on sweet beverages. This includes juice, soda, and sweet tea. Also watch fruit intake, though this is a healthier sweet option, it still contains natural sugar! Limit to 3 servings daily.  Drink at least 1 glass of water with each meal and aim for at least 8 glasses per day  Exercise at least 150 minutes every week.

## 2021-06-28 NOTE — Assessment & Plan Note (Addendum)
Treated on 06/03/2021 for same sx with Cefdinir and prednisone, felt better for a few weeks and then started w/sx again this week. Pt is on Keytruda q3w for stage 4 lung ca. He has had recent flu and covid vaccines. Advised pt on continued diligence with avoiding sick contacts d/t being immunocompromised, Increase his water intake, and sending another round of prednisone today.

## 2021-06-28 NOTE — Progress Notes (Signed)
Subjective:     Patient ID: Kyle Foster, male    DOB: Jan 16, 1946, 75 y.o.   MRN: [id number]  Chief Complaint  Patient presents with   Cough    Pt says that his Covid tests have been negative.    Headache   Nasal Congestion   Wheezing    HPI Patient is in today for Upper Respiratory Infection: Patient complains of symptoms of a URI. Symptoms include cough. Onset of symptoms was 4 days ago, stable since that time. He also c/o  chills, but no fever  for the past 4 days .  He is not drinking much, prefers Ensure over water.  Evaluation to date: seen on 06/03/2021 for same sx which resolved after taking Cefdinir and prednisone, though he had symptoms for over a week before being treated.  Treatment to date: cough suppressants.  Pt also has a history of stage IV lung cancer diagnosed about 2-1/2 years ago, mets to bone on chronic narcotic pain control, COPD, hypertension, hyperlipidemia, CAD status post NSTEMI 2009, history of bladder cancer.    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 immunotherapy    Past Surgical History:  Procedure Laterality Date   CATARACT EXTRACTION W/ INTRAOCULAR LENS IMPLANT Right 12/2007   CORONARY ANGIOPLASTY WITH STENT PLACEMENT  06/2008   "3 stents" (06/14/2018)    CYSTOSCOPY W/ RETROGRADES  09/22/2011   Procedure: CYSTOSCOPY WITH RETROGRADE PYELOGRAM;  Surgeon: Bernestine Amass, MD;  [facility];  Service: Urology;  Laterality: Left;  Cystoscopy left Retrograde Pyelogram      (c-arm)    CYSTOSCOPY WITH BIOPSY  09/22/2011   Procedure: CYSTOSCOPY WITH BIOPSY;  Surgeon: Bernestine Amass, MD;  [facility];  Service: Urology;  Laterality: N/A;   Biopsy   CYSTOSCOPY WITH BIOPSY N/A 04/19/2021   Procedure: CYSTOSCOPY WITH BLADDER  BIOPSY WITH FULGERATION;  Surgeon: [name], MD;  [facility];  Service: Urology;  Laterality: N/A;   EXCISIONAL HEMORRHOIDECTOMY  ~ 2006   EYE SURGERY Left 04/2011   "reconstruction; gold weight in eye lid " (06/14/2018)   FOOT NEUROMA SURGERY Left    immunotherapy     INGUINAL HERNIA REPAIR Right 02/2018   IR IMAGING GUIDED PORT INSERTION  11/23/2018   NM MYOCAR PERF WALL MOTION  07/11/2008   MILD ISCHEMIA IN THE BASL INFERIOR, MID INFERIOR & APICAL INFERIOR REGIONS   SALIVARY GLAND SURGERY Left 04/2011   "squamous cell cancer attached to it; took the gland out"   SKIN CANCER EXCISION     "arms, hands, face, neck" (06/14/2018)   TRANSURETHRAL RESECTION OF BLADDER TUMOR WITH GYRUS (TURBT-GYRUS)  2010    Outpatient  Medications Prior to Visit  Medication Sig Dispense Refill   acetaminophen (TYLENOL) 500 MG tablet Take 1,000 mg by mouth every 8 (eight) hours as needed for moderate pain.     atorvastatin (LIPITOR) 80 MG tablet TAKE 1 TABLET (80 MG TOTAL) BY MOUTH DAILY. NEEDS APPOINTMENT FOR FUTURE REFILLS 60 tablet 1   B Complex-C (B-COMPLEX WITH VITAMIN C) tablet Take 1 tablet by mouth daily.     calcium carbonate (TUMS - DOSED IN MG ELEMENTAL CALCIUM) 500 MG chewable tablet Chew 1 tablet (200 mg of elemental calcium total) by mouth 3 (three) times daily with meals. (Patient taking differently: Chew 1 tablet by mouth 3 (three) times daily as needed for indigestion.) 30 tablet 3   cefdinir (OMNICEF) 300 MG capsule  Take 1 capsule (300 mg total) by mouth 2 (two) times daily. 20 capsule 0   Cholecalciferol (VITAMIN D) 2000 UNITS tablet Take 2,000 Units by mouth daily.     clopidogrel (PLAVIX) 75 MG tablet Take 1 tablet (75 mg total) by mouth daily. NEEDS APPOINTMENT FOR FUTURE REFILLS 90 tablet 3   DENTA 5000 PLUS 1.1 % CREA dental cream Place 1 application onto teeth at bedtime.     DULoxetine (CYMBALTA) 30 MG capsule TAKE 1 CAPSULE BY MOUTH EVERY DAY (Patient taking differently: Take 30 mg by mouth daily. TAKE 1 CAPSULE BY MOUTH EVERY DAY) 90 capsule 2   fentaNYL (DURAGESIC) 50 MCG/HR Place 1 patch onto the skin every 3 (three) days. 10 patch 0   Hypromellose (ARTIFICIAL TEARS OP) Place 1 drop into both eyes at bedtime.      lidocaine-prilocaine (EMLA) cream Apply 1 application topically as needed (access port). 30 g 3   loratadine (CLARITIN) 10 MG tablet Take 10 mg by mouth daily as needed for allergies.     magnesium oxide (MAG-OX) 400 MG tablet Take 1 tablet (400 mg total) by mouth daily. 90 tablet 0   metoprolol tartrate (LOPRESSOR) 25 MG tablet Take 1 tablet (25 mg total) by mouth 2 (two) times daily. 60 tablet 6   morphine (MSIR) 15 MG tablet Take 1-2 tablets (15-30 mg total) by mouth every 4 (four) hours as needed for moderate pain or severe pain. 120 tablet 0   ondansetron (ZOFRAN) 8 MG tablet Take 1 tablet (8 mg total) by mouth every 8 (eight) hours as needed for nausea or vomiting. 30 tablet 2   pantoprazole (PROTONIX) 40 MG tablet TAKE 1 TABLET BY MOUTH EVERY DAY (Patient taking differently: Take 40 mg by mouth daily.) 90 tablet 3   predniSONE (DELTASONE) 20 MG tablet Take 2 pills daily for 3 days, then 1 pill daily for 3 days 9 tablet 0   No facility-administered medications prior to visit.    Allergies  Allergen Reactions   Codeine Other (See Comments)    HEADACHE  headaches        Objective:    Physical Exam Vitals and nursing note reviewed.  Constitutional:      General: He is  not in acute distress.    Appearance: Normal appearance.  HENT:     Head: Normocephalic.  Cardiovascular:     Rate and Rhythm: Normal rate and regular rhythm.  Pulmonary:     Effort: Pulmonary effort is normal. No respiratory distress.     Breath sounds: Wheezing (mild inspiratory in LLL) present. No rhonchi or rales.  Chest:     Chest wall: No tenderness.  Musculoskeletal:        General: Normal  range of motion.     Cervical back: Normal range of motion.  Skin:    General: Skin is warm and dry.  Neurological:     Mental Status: He is alert and oriented to person, place, and time.  Psychiatric:        Mood and Affect: Mood normal.    BP 120/76   Pulse 95   Temp 98.7 F (37.1 C) (Temporal)   Ht 5\' 10"  (1.778 m)   Wt 119 lb 12.8 oz (54.3 kg)   SpO2 98%   BMI 17.19 kg/m  Wt Readings from Last 3 Encounters:  06/28/21 119 lb 12.8 oz (54.3 kg)  06/26/21 124 lb (56.2 kg)  06/05/21 124 lb (56.2 kg)       Assessment & Plan:   Problem List Items Addressed This Visit       Other   Recurrent productive cough - Primary    Treated on 06/03/2021 for same sx with Cefdinir and prednisone, felt better for a few weeks and then started w/sx again this week. Pt is on Keytruda q3w for stage 4 lung ca. He has had recent flu and covid vaccines. Advised pt on continued diligence with avoiding sick contacts d/t being immunocompromised, Increase his water intake, and sending another round of prednisone today.      Relevant Medications   predniSONE (DELTASONE) 20 MG tablet

## 2021-06-28 NOTE — Telephone Encounter (Signed)
Called patient regarding upcoming appointments, left a voicemail. 

## 2021-07-04 DIAGNOSIS — Z23 Encounter for immunization: Secondary | ICD-10-CM | POA: Diagnosis not present

## 2021-07-08 ENCOUNTER — Ambulatory Visit (HOSPITAL_BASED_OUTPATIENT_CLINIC_OR_DEPARTMENT_OTHER)
Admission: RE | Admit: 2021-07-08 | Discharge: 2021-07-08 | Disposition: A | Discharge status: Home / Self Care | Payer: Medicare Other | Source: Ambulatory Visit | Attending: Family | Admitting: Family

## 2021-07-08 ENCOUNTER — Ambulatory Visit (INDEPENDENT_AMBULATORY_CARE_PROVIDER_SITE_OTHER): Payer: Medicare Other | Admitting: Family

## 2021-07-08 ENCOUNTER — Telehealth: Payer: Self-pay

## 2021-07-08 ENCOUNTER — Encounter: Payer: Self-pay | Admitting: Family

## 2021-07-08 ENCOUNTER — Other Ambulatory Visit: Payer: Self-pay

## 2021-07-08 VITALS — BP 122/62 | HR 63 | Temp 98.6°F | Wt 120.2 lb

## 2021-07-08 DIAGNOSIS — H9202 Otalgia, left ear: Secondary | ICD-10-CM | POA: Diagnosis not present

## 2021-07-08 DIAGNOSIS — R053 Chronic cough: Secondary | ICD-10-CM

## 2021-07-08 DIAGNOSIS — J449 Chronic obstructive pulmonary disease, unspecified: Secondary | ICD-10-CM | POA: Diagnosis not present

## 2021-07-08 HISTORY — DX: Chronic cough: R05.3

## 2021-07-08 HISTORY — DX: Otalgia, left ear: H92.02

## 2021-07-08 NOTE — Assessment & Plan Note (Addendum)
Reports hx of left parotid gland cancer in 2011 and reports left ear structure surgery that created a "bend" in his ear causing chronic problems. Ear canal is difficult to navigate, sending referral to ENT.

## 2021-07-08 NOTE — Patient Instructions (Signed)
It was very nice to see you today!  As we discussed, head over to the [facility] on [address] for your chest xray. I will call you with the results. We will discuss then about starting nebulizer treatments.  Go ahead and start using the generic Claritin daily along with Flonase twice a day for a week and see if this helps your sinus symptoms, if yes, it is safe to continue to use daily. I have also sent a referral to ENT regarding your left ear pain & congestion, they will call you directly.   PLEASE NOTE:  If you had any lab tests please let us know if you have not heard back within a few days. You may see your results on mychart before we have a chance to review them but we will give you a call once they are reviewed by [place]. If we ordered any referrals today, please let us know if you have not heard from their office within the next week.   Please try these tips to maintain a healthy lifestyle:  Eat most of your calories during the day when you are active. Eliminate processed foods including packaged sweets (pies, cakes, cookies), reduce intake of potatoes, white bread, white pasta, and white rice. Look for whole grain options, oat flour or almond flour.  Each meal should contain half fruits/vegetables, one quarter protein, and one quarter carbs (no bigger than a computer mouse).  Cut down on sweet beverages. This includes juice, soda, and sweet tea. Also watch fruit intake, though this is a healthier sweet option, it still contains natural sugar! Limit to 3 servings daily.  Drink at least 1 glass of water with each meal and aim for at least 8 glasses per day  Exercise at least 150 minutes every week.

## 2021-07-08 NOTE — Telephone Encounter (Signed)
Patient's wife is calling in wanting to know if the results are back.

## 2021-07-08 NOTE — Progress Notes (Signed)
Subjective:     Patient ID: Kyle Foster, male    DOB: 10-Aug-1946, 75 y.o.   MRN: [id number]  Chief Complaint  Patient presents with   Cough    Cough stopped while on Prednisone, but return once he finished medication   Nasal Congestion   Fatigue   HPI: Upper Respiratory Infection: Patient complains of symptoms of a URI. Symptoms include productive cough, fatigue, and SOB with mild exertion. Onset of symptoms was about 2 weeks ago, with continued cough.  He is not drinking much, prefers Ensure over water.  Evaluation to date: seen on 06/03/2021 for same sx which resolved after taking Cefdinir and prednisone, though he had symptoms for over a week before being treated. Seen again on 06/28/2021 for persistent cough and treated again with prednisone, which pt states resolved his sx, but after stopping his cough returns. Treatment to date: abt and prednisone. CXR on 9/26 indicated emphysema. 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  Ear pain: pt reports recurring pain and congestion in left ear. 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 Pt reports when his ear "grew back it created a bend inside" that requires a specialist to view, and he is requesting a referral.  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 immunotherapy    Past Surgical History:  Procedure Laterality Date   CATARACT EXTRACTION W/ INTRAOCULAR LENS IMPLANT Right 12/2007   CORONARY ANGIOPLASTY WITH STENT PLACEMENT  06/2008   "3 stents" (06/14/2018)   CYSTOSCOPY W/ RETROGRADES  09/22/2011   Procedure: CYSTOSCOPY WITH RETROGRADE PYELOGRAM;  Surgeon: Bernestine Amass, MD;  Location: [facility];  Service: Urology;  Laterality: Left;  Cystoscopy left Retrograde Pyelogram      (c-arm)    CYSTOSCOPY WITH BIOPSY  09/22/2011   Procedure: CYSTOSCOPY WITH BIOPSY;  Surgeon: Bernestine Amass, MD;  Location: [facility];  Service: Urology;  Laterality: N/A;   Biopsy   CYSTOSCOPY WITH BIOPSY N/A 04/19/2021   Procedure: CYSTOSCOPY WITH BLADDER  BIOPSY WITH FULGERATION;  Surgeon: [name], MD;  Location: [facility];  Service: Urology;  Laterality: N/A;   EXCISIONAL HEMORRHOIDECTOMY  ~ 2006   EYE SURGERY Left 04/2011   "reconstruction; gold weight in eye lid " (06/14/2018)   FOOT NEUROMA SURGERY Left    immunotherapy     INGUINAL HERNIA REPAIR Right 02/2018   IR IMAGING GUIDED PORT INSERTION  11/23/2018  NM MYOCAR PERF WALL MOTION  07/11/2008   MILD ISCHEMIA IN THE BASL INFERIOR, MID INFERIOR & APICAL INFERIOR REGIONS   SALIVARY GLAND SURGERY Left 04/2011   "squamous cell cancer attached to it; took the gland out"   SKIN CANCER EXCISION     "arms, hands, face, neck" (06/14/2018)   TRANSURETHRAL RESECTION OF BLADDER TUMOR WITH GYRUS (TURBT-GYRUS)  2010    Outpatient Medications Prior to Visit  Medication Sig Dispense Refill   acetaminophen (TYLENOL) 500 MG tablet Take 1,000 mg by mouth every 8 (eight) hours as needed for moderate pain.     atorvastatin (LIPITOR) 80 MG  tablet TAKE 1 TABLET (80 MG TOTAL) BY MOUTH DAILY. NEEDS APPOINTMENT FOR FUTURE REFILLS 60 tablet 1   B Complex-C (B-COMPLEX WITH VITAMIN C) tablet Take 1 tablet by mouth daily.     calcium carbonate (TUMS - DOSED IN MG ELEMENTAL CALCIUM) 500 MG chewable tablet Chew 1 tablet (200 mg of elemental calcium total) by mouth 3 (three) times daily with meals. (Patient taking differently: Chew 1 tablet by mouth 3 (three) times daily as needed for indigestion.) 30 tablet 3   Cholecalciferol (VITAMIN D) 2000 UNITS tablet Take 2,000 Units by mouth daily.     clopidogrel (PLAVIX) 75 MG tablet Take 1 tablet (75 mg total) by mouth daily. NEEDS APPOINTMENT FOR FUTURE REFILLS 90 tablet 3   DENTA 5000 PLUS 1.1 % CREA dental cream Place 1 application onto teeth at bedtime.     DULoxetine (CYMBALTA) 30 MG capsule TAKE 1 CAPSULE BY MOUTH EVERY DAY (Patient taking differently: Take 30 mg by mouth daily. TAKE 1 CAPSULE BY MOUTH EVERY DAY) 90 capsule 2   fentaNYL (DURAGESIC) 50 MCG/HR Place 1 patch onto the skin every 3 (three) days. 10 patch 0   Hypromellose (ARTIFICIAL TEARS OP) Place 1 drop into both eyes at bedtime.      lidocaine-prilocaine (EMLA) cream Apply 1 application topically as needed (access port). 30 g 3   loratadine (CLARITIN) 10 MG tablet Take 10 mg by mouth daily as needed for allergies.     magnesium oxide (MAG-OX) 400 MG tablet Take 1 tablet (400 mg total) by mouth daily. 90 tablet 0   metoprolol tartrate (LOPRESSOR) 25 MG tablet Take 1 tablet (25 mg total) by mouth 2 (two) times daily. 60 tablet 6   morphine (MSIR) 15 MG tablet Take 1-2 tablets (15-30 mg total) by mouth every 4 (four) hours as needed for moderate pain or severe pain. 120 tablet 0   ondansetron (ZOFRAN) 8 MG tablet Take 1 tablet (8 mg total) by mouth every 8 (eight) hours as needed for nausea or vomiting. 30 tablet 2   pantoprazole (PROTONIX) 40 MG tablet TAKE 1 TABLET BY MOUTH EVERY DAY (Patient taking differently: Take 40 mg by mouth  daily.) 90 tablet 3   predniSONE (DELTASONE) 20 MG tablet Take 2 pills daily for 3 days, then 1 pill daily for 3 days (Patient not taking: Reported on 07/08/2021) 9 tablet 0   cefdinir (OMNICEF) 300 MG capsule Take 1 capsule (300 mg total) by mouth 2 (two) times daily. (Patient not taking: Reported on 07/08/2021) 20 capsule 0   No facility-administered medications prior to visit.    Allergies  Allergen Reactions   Codeine Other (See Comments)    HEADACHE  headaches        Objective:    Physical Exam Vitals and nursing note reviewed.  Constitutional:      General: He is not  in acute distress.    Appearance: Normal appearance.  HENT:     Head: Normocephalic.     Right Ear: Tympanic membrane and ear canal normal.     Ears:     Comments: Left ear visibly larger w/mild deformation, and very difficult to navigate otoscope in ear canal. Cardiovascular:     Rate and Rhythm: Normal rate and regular rhythm.  Pulmonary:     Effort: Pulmonary effort is normal.     Breath sounds: Examination of the left-lower field reveals wheezing. Wheezing present.  Musculoskeletal:        General: Normal range of motion.     Cervical back: Normal range of motion.  Skin:    General: Skin is warm and dry.  Neurological:     Mental Status: He is alert and oriented to person, place, and time.  Psychiatric:        Mood and Affect: Mood normal.    BP 122/62   Pulse 63   Temp 98.6 F (37 C) (Temporal)   Wt 120 lb 3.2 oz (54.5 kg)   SpO2 97%   BMI 17.25 kg/m  Wt Readings from Last 3 Encounters:  07/08/21 120 lb 3.2 oz (54.5 kg)  06/28/21 119 lb 12.8 oz (54.3 kg)  06/26/21 124 lb (56.2 kg)       Assessment & Plan:   Problem List Items Addressed This Visit       Respiratory   COPD with chronic bronchitis and emphysema ([place])    Pt reports being on Trelegy in past, then stopped using as symptoms improved. CXR on 9/26 showed emphysema, w/hyperinflated lung, rechecked again today with  advanced emphysema. Due to pt current symptoms, inability to see his PCP,  and only getting relief with prednisone, I am ordering a nebulizer w/Duoneb, and restarting his Trelegy. I have advised he discuss these new meds with his pulmonary oncologist as I am not providing refills at this time.      Relevant Medications   Fluticasone-Umeclidin-Vilant (TRELEGY ELLIPTA) 100-62.5-25 MCG/ACT AEPB   ipratropium-albuterol (DUONEB) 0.5-2.5 (3) MG/3ML SOLN   Other Relevant Orders   For home use only DME Nebulizer machine     Other   Persistent cough for 3 weeks or longer - Primary   Relevant Orders   DG Chest 2 View (Completed)   Left ear pain    Reports hx of left parotid gland cancer in 2011 and reports left ear structure surgery that created a "bend" in his ear causing chronic problems. Ear canal is difficult to navigate, sending referral to ENT.      Relevant Orders   Ambulatory referral to ENT    Meds ordered this encounter  Medications   Fluticasone-Umeclidin-Vilant (TRELEGY ELLIPTA) 100-62.5-25 MCG/ACT AEPB    Sig: Inhale 1 puff into the lungs daily.    Dispense:  1 each    Refill:  1    Order Specific Question:   Supervising Provider    Answer:   ANDY, CAMILLE L [[id number]]   ipratropium-albuterol (DUONEB) 0.5-2.5 (3) MG/3ML SOLN    Sig: Take 3 mLs by nebulization every 6 (six) hours as needed. Start with using nebulizer after awakening in the morning, mid-day, and then prior to bedtime for the first week-10 days then back off to as needed as congestion and wheezing improves.    Dispense:  360 mL    Refill:  0    Order Specific Question:   Supervising Provider    Answer:   Jonni Sanger,  CAMILLE L [[id number]]

## 2021-07-09 ENCOUNTER — Other Ambulatory Visit: Payer: Self-pay | Admitting: Family

## 2021-07-09 DIAGNOSIS — J439 Emphysema, unspecified: Secondary | ICD-10-CM | POA: Insufficient documentation

## 2021-07-09 MED ORDER — IPRATROPIUM-ALBUTEROL 0.5-2.5 (3) MG/3ML IN SOLN: 3.0000 mL | Freq: Four times a day (QID) | RESPIRATORY_TRACT | 0 refills | Status: DC | PRN

## 2021-07-09 MED ORDER — TRELEGY ELLIPTA 100-62.5-25 MCG/ACT IN AEPB: 1.0000 | INHALATION_SPRAY | Freq: Every day | RESPIRATORY_TRACT | 1 refills | Status: DC

## 2021-07-09 NOTE — Progress Notes (Signed)
Let Kyle Foster know his CXR is negative for any pneumonia, does show advancing Emphysema, so do believe he could benefit from a nebulizer and restarting his inhaler, t Trelegy. I am sending a refill for this and an order has been sent for nebulizer. If he has any questions on how to use this he can call or schedule a nurse visit. Any questions, let me know, thanks.

## 2021-07-09 NOTE — Telephone Encounter (Signed)
Please read below...

## 2021-07-09 NOTE — Telephone Encounter (Signed)
Patient's wife is calling in again, stating she hasnt heard anything. Advised that we are still waiting on Stephanie to read the results and we will call once that is done.

## 2021-07-09 NOTE — Telephone Encounter (Signed)
Please advise for Chest x ray results.

## 2021-07-09 NOTE — Assessment & Plan Note (Signed)
Pt reports being on Trelegy in past, then stopped using as symptoms improved. CXR on 9/26 showed emphysema, w/hyperinflated lung, rechecked again today with advanced emphysema. Due to pt current symptoms, inability to see his PCP,  and only getting relief with prednisone, I am ordering a nebulizer w/Duoneb, and restarting his Trelegy. I have advised he discuss these new meds with his pulmonary oncologist as I am not providing refills at this time.

## 2021-07-09 NOTE — Telephone Encounter (Signed)
Sending separate message.

## 2021-07-09 NOTE — Telephone Encounter (Signed)
Please see lab result message.

## 2021-07-10 ENCOUNTER — Telehealth: Payer: Self-pay

## 2021-07-10 NOTE — Telephone Encounter (Signed)
Error

## 2021-07-10 NOTE — Telephone Encounter (Signed)
Patient's wife is a calling in stating that the inhaler is not covered by insurance, and is wondering if there is something else that can be sent in.

## 2021-07-15 NOTE — Telephone Encounter (Signed)
Pt's wife also states that he has received Trelegy, started Flonase, Claritin and is now feeling better.

## 2021-07-15 NOTE — Telephone Encounter (Signed)
I have spoken with and documented the conversation about this medication in the DG chest xray result note on 07/09/2021. I also spoke with the pt's wife again today to explain again that she can call the insurance to see which similar medication is covered and for her to update me, so that it can be called into his pharmacy. She voiced understanding. She is also frustrated because her PCP's office is refusing to see the pt although he is negative for Covid.

## 2021-07-17 ENCOUNTER — Inpatient Hospital Stay: Payer: No Typology Code available for payment source

## 2021-07-17 ENCOUNTER — Inpatient Hospital Stay: Payer: No Typology Code available for payment source | Attending: Hematology

## 2021-07-17 ENCOUNTER — Other Ambulatory Visit: Payer: Self-pay

## 2021-07-17 ENCOUNTER — Ambulatory Visit: Payer: No Typology Code available for payment source

## 2021-07-17 ENCOUNTER — Inpatient Hospital Stay (HOSPITAL_BASED_OUTPATIENT_CLINIC_OR_DEPARTMENT_OTHER): Payer: No Typology Code available for payment source | Admitting: Hematology

## 2021-07-17 VITALS — BP 122/59

## 2021-07-17 VITALS — BP 101/53 | HR 63 | Temp 98.3°F | Resp 16 | Ht 70.0 in | Wt 124.0 lb

## 2021-07-17 DIAGNOSIS — C3492 Malignant neoplasm of unspecified part of left bronchus or lung: Secondary | ICD-10-CM

## 2021-07-17 DIAGNOSIS — G893 Neoplasm related pain (acute) (chronic): Secondary | ICD-10-CM | POA: Insufficient documentation

## 2021-07-17 DIAGNOSIS — Z79899 Other long term (current) drug therapy: Secondary | ICD-10-CM | POA: Insufficient documentation

## 2021-07-17 DIAGNOSIS — C7951 Secondary malignant neoplasm of bone: Secondary | ICD-10-CM | POA: Insufficient documentation

## 2021-07-17 DIAGNOSIS — Z923 Personal history of irradiation: Secondary | ICD-10-CM | POA: Diagnosis not present

## 2021-07-17 DIAGNOSIS — Z7952 Long term (current) use of systemic steroids: Secondary | ICD-10-CM | POA: Diagnosis not present

## 2021-07-17 DIAGNOSIS — Z9221 Personal history of antineoplastic chemotherapy: Secondary | ICD-10-CM | POA: Diagnosis not present

## 2021-07-17 DIAGNOSIS — Z5112 Encounter for antineoplastic immunotherapy: Secondary | ICD-10-CM | POA: Diagnosis present

## 2021-07-17 DIAGNOSIS — Z7189 Other specified counseling: Secondary | ICD-10-CM

## 2021-07-17 DIAGNOSIS — Z8551 Personal history of malignant neoplasm of bladder: Secondary | ICD-10-CM | POA: Insufficient documentation

## 2021-07-17 DIAGNOSIS — Z95828 Presence of other vascular implants and grafts: Secondary | ICD-10-CM

## 2021-07-17 DIAGNOSIS — C3412 Malignant neoplasm of upper lobe, left bronchus or lung: Secondary | ICD-10-CM | POA: Diagnosis present

## 2021-07-17 LAB — CMP (CANCER CENTER ONLY)
ALT: 21 U/L (ref 0–44)
AST: 24 U/L (ref 15–41)
Albumin: 3.4 g/dL — ABNORMAL LOW (ref 3.5–5.0)
Alkaline Phosphatase: 52 U/L (ref 38–126)
Anion gap: 7 (ref 5–15)
BUN: 17 mg/dL (ref 8–23)
CO2: 25 mmol/L (ref 22–32)
Calcium: 8.8 mg/dL — ABNORMAL LOW (ref 8.9–10.3)
Chloride: 106 mmol/L (ref 98–111)
Creatinine: 0.77 mg/dL (ref 0.61–1.24)
GFR, Estimated: >60 mL/min (ref >60)
Glucose, Bld: 87 mg/dL (ref 70–99)
Potassium: 4.6 mmol/L (ref 3.5–5.1)
Sodium: 138 mmol/L (ref 135–145)
Total Bilirubin: 0.7 mg/dL (ref 0.3–1.2)
Total Protein: 6.4 g/dL — ABNORMAL LOW (ref 6.5–8.1)

## 2021-07-17 LAB — CBC WITH DIFFERENTIAL/PLATELET
Abs Immature Granulocytes: 0.02 10*3/uL (ref 0.00–0.07)
Basophils Absolute: 0 10*3/uL (ref 0.0–0.1)
Basophils Relative: 0 %
Eosinophils Absolute: 0.2 10*3/uL (ref 0.0–0.5)
Eosinophils Relative: 3 %
HCT: 36.3 % — ABNORMAL LOW (ref 39.0–52.0)
Hemoglobin: 12.3 g/dL — ABNORMAL LOW (ref 13.0–17.0)
Immature Granulocytes: 0 %
Lymphocytes Relative: 8 %
Lymphs Abs: 0.5 10*3/uL — ABNORMAL LOW (ref 0.7–4.0)
MCH: 33.2 pg (ref 26.0–34.0)
MCHC: 33.9 g/dL (ref 30.0–36.0)
MCV: 98.1 fL (ref 80.0–100.0)
Monocytes Absolute: 0.7 10*3/uL (ref 0.1–1.0)
Monocytes Relative: 10 %
Neutro Abs: 5.5 10*3/uL (ref 1.7–7.7)
Neutrophils Relative %: 79 %
Platelets: 154 10*3/uL (ref 150–400)
RBC: 3.7 MIL/uL — ABNORMAL LOW (ref 4.22–5.81)
RDW: 13.3 % (ref 11.5–15.5)
WBC: 6.9 10*3/uL (ref 4.0–10.5)
nRBC: 0 % (ref 0.0–0.2)

## 2021-07-17 MED ORDER — SODIUM CHLORIDE 0.9 % IV SOLN: Freq: Once | INTRAVENOUS | Status: AC

## 2021-07-17 MED ORDER — SODIUM CHLORIDE 0.9% FLUSH: 10.0000 mL | Freq: Once | INTRAVENOUS | Status: DC

## 2021-07-17 MED ORDER — HEPARIN SOD (PORK) LOCK FLUSH 100 UNIT/ML IV SOLN
500.0000 [IU] | Freq: Once | INTRAVENOUS | Status: AC | PRN
  Administered 2021-07-17: 500 [IU]

## 2021-07-17 MED ORDER — DIPHENHYDRAMINE HCL 50 MG/ML IJ SOLN
25.0000 mg | Freq: Once | INTRAMUSCULAR | Status: AC
  Administered 2021-07-17: 25 mg via INTRAVENOUS
  Filled 2021-07-17: qty 1

## 2021-07-17 MED ORDER — FAMOTIDINE 20 MG IN NS 100 ML IVPB
20.0000 mg | Freq: Once | INTRAVENOUS | Status: AC
  Administered 2021-07-17: 20 mg via INTRAVENOUS
  Filled 2021-07-17: qty 100

## 2021-07-17 MED ORDER — SODIUM CHLORIDE 0.9% FLUSH
10.0000 mL | INTRAVENOUS | Status: DC | PRN
  Administered 2021-07-17: 10 mL

## 2021-07-17 MED ORDER — SODIUM CHLORIDE 0.9 % IV SOLN
200.0000 mg | Freq: Once | INTRAVENOUS | Status: AC
  Administered 2021-07-17: 200 mg via INTRAVENOUS
  Filled 2021-07-17: qty 8
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
## 2021-07-17 NOTE — Patient Instructions (Signed)
[facility]ICAL [facility]COL[facility]  Discharge Instructions: ?Thank you for choosing [facility] to provide your oncology and hematology care.  ? ?If you have a lab appointment with the Cancer Center, please go directly to the Cancer Center and check in at the registration area. ?  ?Wear comfortable clothing and clothing appropriate for easy access to any Portacath or PICC line.  ? ?We strive to give you quality time with your provider. You may need to reschedule your appointment if you arrive late (15 or more minutes).  Arriving late affects you and other patients whose appointments are after yours.  Also, if you miss three or more appointments without notifying the office, you may be dismissed from the clinic at the provider?s discretion.    ?  ?For prescription refill requests, have your pharmacy contact our office and allow 72 hours for refills to be completed.   ? ?Today you received the following chemotherapy and/or immunotherapy agents: Keytruda ?  ?To help prevent nausea and vomiting after your treatment, we encourage you to take your nausea medication as directed. ? ?BELOW ARE SYMPTOMS THAT SHOULD BE REPORTED IMMEDIATELY: ?*FEVER GREATER THAN 100.4 F (38 ?C) OR HIGHER ?*CHILLS OR SWEATING ?*NAUSEA AND VOMITING THAT IS NOT CONTROLLED WITH YOUR NAUSEA MEDICATION ?*UNUSUAL SHORTNESS OF BREATH ?*UNUSUAL BRUISING OR BLEEDING ?*URINARY PROBLEMS (pain or burning when urinating, or frequent urination) ?*BOWEL PROBLEMS (unusual diarrhea, constipation, pain near the anus) ?TENDERNESS IN MOUTH AND THROAT WITH OR WITHOUT PRESENCE OF ULCERS (sore throat, sores in mouth, or a toothache) ?UNUSUAL RASH, SWELLING OR PAIN  ?UNUSUAL VAGINAL DISCHARGE OR ITCHING  ? ?Items with * indicate a potential emergency and should be followed up as soon as possible or go to the Emergency Department if any problems should occur. ? ?Please show the CHEMOTHERAPY ALERT CARD or IMMUNOTHERAPY ALERT CARD at check-in to the  Emergency Department and triage nurse. ? ?Should you have questions after your visit or need to cancel or reschedule your appointment, please contact [facility]  Dept: [phone]  and follow the prompts.  Office hours are 8:00 a.m. to 4:30 p.m. Monday - Friday. Please note that voicemails left after 4:00 p.m. may not be returned until the following business day.  We are closed weekends and major holidays. You have access to a nurse at all times for urgent questions. Please call the main number to the clinic Dept: [phone] and follow the prompts. ? ? ?For any non-urgent questions, you may also contact your provider using MyChart. We now offer e-Visits for anyone 18 and older to request care online for non-urgent symptoms. For details visit [email or web address]. ?  ?Also download the MyChart app! Go to the app store, search "MyChart", open the app, select [facility], and log in with your MyChart username and password. ? ?Due to Covid, a mask is required upon entering the hospital/clinic. If you do not have a mask, one will be given to you upon arrival. For doctor visits, patients may have 1 support person aged 18 or older with them. For treatment visits, patients cannot have anyone with them due to current Covid guidelines and our immunocompromised population.  ? ?

## 2021-07-18 ENCOUNTER — Other Ambulatory Visit: Payer: Self-pay

## 2021-07-18 DIAGNOSIS — C3492 Malignant neoplasm of unspecified part of left bronchus or lung: Secondary | ICD-10-CM

## 2021-07-19 ENCOUNTER — Encounter: Payer: Self-pay | Admitting: Hematology

## 2021-07-19 MED ORDER — MORPHINE SULFATE 15 MG PO TABS: 15.0000 mg | ORAL_TABLET | ORAL | 0 refills | Status: DC | PRN

## 2021-07-19 MED ORDER — FENTANYL 50 MCG/HR TD PT72: 1.0000 | MEDICATED_PATCH | TRANSDERMAL | 0 refills | Status: DC

## 2021-07-22 ENCOUNTER — Telehealth: Payer: Self-pay

## 2021-07-22 NOTE — Telephone Encounter (Signed)
Pt's wife called about medication. She would like a call back. She stated that Kyle Foster received a nebulizer when he does not have the supplies for it. She stated that he has an inhaler. They are confused on what is going on. Please Advise.

## 2021-07-23 ENCOUNTER — Telehealth: Payer: Self-pay

## 2021-07-23 ENCOUNTER — Other Ambulatory Visit: Payer: Self-pay

## 2021-07-23 NOTE — Telephone Encounter (Signed)
I spoke with the pt's wife-Melissa, who is on the pt's DPR. She said that they received the medication for the nebulizer, but not the nebulizer. Overall she states that he was feeling better, but still is mildly congested. I will contact the pharmacy to get correct order sent.

## 2021-07-23 NOTE — Telephone Encounter (Signed)
How long does the pt need the nebulizer treatment? Please advise for nebulizer order details.

## 2021-07-23 NOTE — Telephone Encounter (Signed)
error 

## 2021-07-24 ENCOUNTER — Telehealth: Payer: Self-pay | Admitting: Hematology

## 2021-07-24 DIAGNOSIS — Z8551 Personal history of malignant neoplasm of bladder: Secondary | ICD-10-CM | POA: Diagnosis not present

## 2021-07-24 NOTE — Telephone Encounter (Signed)
As long as he has congestion in his chest and has a productive cough he should use the nebulizer 3x/day - I recommend he use at least every morning and evening around bedtime ongoing as long as there is cough, SOB, mucus.

## 2021-07-24 NOTE — Telephone Encounter (Signed)
Scheduled follow-up appointments per 11/9 los. Patient's wife is aware.

## 2021-07-29 ENCOUNTER — Other Ambulatory Visit: Payer: Self-pay

## 2021-07-29 ENCOUNTER — Telehealth: Payer: Self-pay

## 2021-07-29 ENCOUNTER — Other Ambulatory Visit: Payer: Self-pay | Admitting: Registered Nurse

## 2021-07-29 DIAGNOSIS — R053 Chronic cough: Secondary | ICD-10-CM

## 2021-07-29 DIAGNOSIS — C3492 Malignant neoplasm of unspecified part of left bronchus or lung: Secondary | ICD-10-CM

## 2021-07-29 NOTE — Telephone Encounter (Signed)
Order will be sent in by Dr Orland Mustard. Kyle Foster has been contacted and will take over from this point.

## 2021-07-29 NOTE — Telephone Encounter (Signed)
Patient's wife is calling in stating that Sinclair was prescribed a nebulizer but doesn't have the machine to use it, spoke with Melitta on Monday but hasnt heard back from her. Wanting to know what they should be doing as his symptoms are getting better. They want to know if they can get a medication for the inhaler.

## 2021-08-06 ENCOUNTER — Other Ambulatory Visit: Payer: Self-pay | Admitting: Hematology

## 2021-08-07 ENCOUNTER — Other Ambulatory Visit: Payer: Self-pay

## 2021-08-07 ENCOUNTER — Telehealth: Payer: Self-pay

## 2021-08-07 ENCOUNTER — Inpatient Hospital Stay: Payer: No Typology Code available for payment source

## 2021-08-07 ENCOUNTER — Other Ambulatory Visit: Payer: Self-pay | Admitting: Hematology

## 2021-08-07 VITALS — BP 127/65 | HR 62 | Temp 98.4°F | Resp 16 | Wt 124.0 lb

## 2021-08-07 DIAGNOSIS — Z5112 Encounter for antineoplastic immunotherapy: Secondary | ICD-10-CM

## 2021-08-07 DIAGNOSIS — Z7189 Other specified counseling: Secondary | ICD-10-CM

## 2021-08-07 DIAGNOSIS — C7951 Secondary malignant neoplasm of bone: Secondary | ICD-10-CM

## 2021-08-07 DIAGNOSIS — C3492 Malignant neoplasm of unspecified part of left bronchus or lung: Secondary | ICD-10-CM

## 2021-08-07 DIAGNOSIS — Z95828 Presence of other vascular implants and grafts: Secondary | ICD-10-CM

## 2021-08-07 LAB — CBC WITH DIFFERENTIAL/PLATELET
Abs Immature Granulocytes: 0.02 10*3/uL (ref 0.00–0.07)
Basophils Absolute: 0 10*3/uL (ref 0.0–0.1)
Basophils Relative: 0 %
Eosinophils Absolute: 0.2 10*3/uL (ref 0.0–0.5)
Eosinophils Relative: 2 %
HCT: 36.6 % — ABNORMAL LOW (ref 39.0–52.0)
Hemoglobin: 12.5 g/dL — ABNORMAL LOW (ref 13.0–17.0)
Immature Granulocytes: 0 %
Lymphocytes Relative: 7 %
Lymphs Abs: 0.6 10*3/uL — ABNORMAL LOW (ref 0.7–4.0)
MCH: 33.2 pg (ref 26.0–34.0)
MCHC: 34.2 g/dL (ref 30.0–36.0)
MCV: 97.1 fL (ref 80.0–100.0)
Monocytes Absolute: 0.7 10*3/uL (ref 0.1–1.0)
Monocytes Relative: 9 %
Neutro Abs: 6.3 10*3/uL (ref 1.7–7.7)
Neutrophils Relative %: 82 %
Platelets: 169 10*3/uL (ref 150–400)
RBC: 3.77 MIL/uL — ABNORMAL LOW (ref 4.22–5.81)
RDW: 13.1 % (ref 11.5–15.5)
WBC: 7.7 10*3/uL (ref 4.0–10.5)
nRBC: 0 % (ref 0.0–0.2)

## 2021-08-07 LAB — CMP (CANCER CENTER ONLY)
ALT: 20 U/L (ref 0–44)
AST: 24 U/L (ref 15–41)
Albumin: 3.7 g/dL (ref 3.5–5.0)
Alkaline Phosphatase: 47 U/L (ref 38–126)
Anion gap: 7 (ref 5–15)
BUN: 21 mg/dL (ref 8–23)
CO2: 27 mmol/L (ref 22–32)
Calcium: 8.8 mg/dL — ABNORMAL LOW (ref 8.9–10.3)
Chloride: 103 mmol/L (ref 98–111)
Creatinine: 0.86 mg/dL (ref 0.61–1.24)
GFR, Estimated: >60 mL/min (ref >60)
Glucose, Bld: 88 mg/dL (ref 70–99)
Potassium: 4.4 mmol/L (ref 3.5–5.1)
Sodium: 137 mmol/L (ref 135–145)
Total Bilirubin: 0.5 mg/dL (ref 0.3–1.2)
Total Protein: 6.8 g/dL (ref 6.5–8.1)

## 2021-08-07 MED ORDER — DIPHENHYDRAMINE HCL 50 MG/ML IJ SOLN
25.0000 mg | Freq: Once | INTRAMUSCULAR | Status: AC
Start: 2021-08-07 — End: 2021-08-07
  Administered 2021-08-07: 25 mg via INTRAVENOUS
  Filled 2021-08-07: qty 1

## 2021-08-07 MED ORDER — FAMOTIDINE 20 MG IN NS 100 ML IVPB
20.0000 mg | Freq: Once | INTRAVENOUS | Status: AC
  Administered 2021-08-07: 20 mg via INTRAVENOUS
  Filled 2021-08-07: qty 100

## 2021-08-07 MED ORDER — SODIUM CHLORIDE 0.9% FLUSH
10.0000 mL | INTRAVENOUS | Status: AC | PRN
  Administered 2021-08-07: 10 mL

## 2021-08-07 MED ORDER — SODIUM CHLORIDE 0.9% FLUSH
10.0000 mL | INTRAVENOUS | Status: DC | PRN
  Administered 2021-08-07: 10 mL

## 2021-08-07 MED ORDER — SODIUM CHLORIDE 0.9 % IV SOLN
200.0000 mg | Freq: Once | INTRAVENOUS | Status: AC
  Administered 2021-08-07: 200 mg via INTRAVENOUS
  Filled 2021-08-07: qty 8

## 2021-08-07 MED ORDER — SODIUM CHLORIDE 0.9 % IV SOLN: Freq: Once | INTRAVENOUS | Status: AC

## 2021-08-07 MED ORDER — HEPARIN SOD (PORK) LOCK FLUSH 100 UNIT/ML IV SOLN
500.0000 [IU] | Freq: Once | INTRAVENOUS | Status: AC | PRN
  Administered 2021-08-07: 500 [IU]

## 2021-08-07 NOTE — Telephone Encounter (Signed)
Spouse is calling back in for patient.    States that he was seen by Colletta [place] and prescribed an inhaler.  States this could not get approved by insurance.    States patient finally received medication for a nebulizer he does not have.  States they have also been waiting on a return phone call.  Is requesting call back ASAP.

## 2021-08-07 NOTE — Progress Notes (Signed)
Per Dr. [name], okay to proceed with [facility] pending CMP results.  Also, we will continue to hold Xgeva given patient is still healing from dental extractions per Dr. [name].

## 2021-08-07 NOTE — Patient Instructions (Signed)
[facility]  Discharge Instructions: ?Thank you for choosing [facility] to provide your oncology and hematology care.  ? ?If you have a lab appointment with the Cancer Center, please go directly to the Cancer Center and check in at the registration area. ?  ?Wear comfortable clothing and clothing appropriate for easy access to any Portacath or PICC line.  ? ?We strive to give you quality time with your provider. You may need to reschedule your appointment if you arrive late (15 or more minutes).  Arriving late affects you and other patients whose appointments are after yours.  Also, if you miss three or more appointments without notifying the office, you may be dismissed from the clinic at the provider?s discretion.    ?  ?For prescription refill requests, have your pharmacy contact our office and allow 72 hours for refills to be completed.   ? ?Today you received the following chemotherapy and/or immunotherapy agents: Keytruda ?  ?To help prevent nausea and vomiting after your treatment, we encourage you to take your nausea medication as directed. ? ?BELOW ARE SYMPTOMS THAT SHOULD BE REPORTED IMMEDIATELY: ?*FEVER GREATER THAN 100.4 F (38 ?C) OR HIGHER ?*CHILLS OR SWEATING ?*NAUSEA AND VOMITING THAT IS NOT CONTROLLED WITH YOUR NAUSEA MEDICATION ?*UNUSUAL SHORTNESS OF BREATH ?*UNUSUAL BRUISING OR BLEEDING ?*URINARY PROBLEMS (pain or burning when urinating, or frequent urination) ?*BOWEL PROBLEMS (unusual diarrhea, constipation, pain near the anus) ?TENDERNESS IN MOUTH AND THROAT WITH OR WITHOUT PRESENCE OF ULCERS (sore throat, sores in mouth, or a toothache) ?UNUSUAL RASH, SWELLING OR PAIN  ?UNUSUAL VAGINAL DISCHARGE OR ITCHING  ? ?Items with * indicate a potential emergency and should be followed up as soon as possible or go to the Emergency Department if any problems should occur. ? ?Please show the CHEMOTHERAPY ALERT CARD or IMMUNOTHERAPY ALERT CARD at check-in to the  Emergency Department and triage nurse. ? ?Should you have questions after your visit or need to cancel or reschedule your appointment, please contact [facility]  Dept: [phone]  and follow the prompts.  Office hours are 8:00 a.m. to 4:30 p.m. Monday - Friday. Please note that voicemails left after 4:00 p.m. may not be returned until the following business day.  We are closed weekends and major holidays. You have access to a nurse at all times for urgent questions. Please call the main number to the clinic Dept: [phone] and follow the prompts. ? ? ?For any non-urgent questions, you may also contact your provider using MyChart. We now offer e-Visits for anyone 18 and older to request care online for non-urgent symptoms. For details visit [email or web address]. ?  ?Also download the MyChart app! Go to the app store, search "MyChart", open the app, select [facility], and log in with your MyChart username and password. ? ?Due to Covid, a mask is required upon entering the hospital/clinic. If you do not have a mask, one will be given to you upon arrival. For doctor visits, patients may have 1 support person aged 18 or older with them. For treatment visits, patients cannot have anyone with them due to current Covid guidelines and our immunocompromised population.  ? ?

## 2021-08-08 NOTE — Telephone Encounter (Signed)
Called and LVM for the patient to pick up the order for the Nebulizer machine.

## 2021-08-15 ENCOUNTER — Other Ambulatory Visit: Payer: Self-pay

## 2021-08-15 DIAGNOSIS — C3492 Malignant neoplasm of unspecified part of left bronchus or lung: Secondary | ICD-10-CM

## 2021-08-20 DIAGNOSIS — H6123 Impacted cerumen, bilateral: Secondary | ICD-10-CM | POA: Diagnosis not present

## 2021-08-20 DIAGNOSIS — H61892 Other specified disorders of left external ear: Secondary | ICD-10-CM | POA: Diagnosis not present

## 2021-08-20 DIAGNOSIS — H02236 Paralytic lagophthalmos left eye, unspecified eyelid: Secondary | ICD-10-CM | POA: Diagnosis not present

## 2021-08-20 DIAGNOSIS — H60333 Swimmer's ear, bilateral: Secondary | ICD-10-CM | POA: Diagnosis not present

## 2021-08-20 DIAGNOSIS — Z923 Personal history of irradiation: Secondary | ICD-10-CM | POA: Diagnosis not present

## 2021-08-20 DIAGNOSIS — Z8509 Personal history of malignant neoplasm of other digestive organs: Secondary | ICD-10-CM | POA: Diagnosis not present

## 2021-08-20 DIAGNOSIS — G510 Bell's palsy: Secondary | ICD-10-CM | POA: Diagnosis not present

## 2021-08-20 DIAGNOSIS — Z9221 Personal history of antineoplastic chemotherapy: Secondary | ICD-10-CM | POA: Diagnosis not present

## 2021-08-23 ENCOUNTER — Other Ambulatory Visit: Payer: Self-pay | Admitting: Hematology

## 2021-08-23 ENCOUNTER — Encounter: Payer: Self-pay | Admitting: Hematology

## 2021-08-23 MED ORDER — MORPHINE SULFATE 15 MG PO TABS: 15.0000 mg | ORAL_TABLET | ORAL | 0 refills | Status: DC | PRN

## 2021-08-23 MED ORDER — FENTANYL 50 MCG/HR TD PT72: 1.0000 | MEDICATED_PATCH | TRANSDERMAL | 0 refills | Status: DC

## 2021-08-24 ENCOUNTER — Other Ambulatory Visit: Payer: Self-pay

## 2021-08-28 ENCOUNTER — Other Ambulatory Visit: Payer: No Typology Code available for payment source

## 2021-08-28 ENCOUNTER — Other Ambulatory Visit: Payer: Self-pay

## 2021-08-28 ENCOUNTER — Inpatient Hospital Stay: Payer: No Typology Code available for payment source | Attending: Hematology

## 2021-08-28 ENCOUNTER — Encounter: Payer: Self-pay | Admitting: Adult Health

## 2021-08-28 ENCOUNTER — Inpatient Hospital Stay: Payer: No Typology Code available for payment source

## 2021-08-28 ENCOUNTER — Inpatient Hospital Stay (HOSPITAL_BASED_OUTPATIENT_CLINIC_OR_DEPARTMENT_OTHER): Payer: No Typology Code available for payment source | Admitting: Adult Health

## 2021-08-28 ENCOUNTER — Ambulatory Visit: Payer: No Typology Code available for payment source

## 2021-08-28 VITALS — BP 100/40 | HR 76 | Temp 97.8°F | Resp 18 | Ht 70.0 in | Wt 122.8 lb

## 2021-08-28 VITALS — BP 112/61

## 2021-08-28 DIAGNOSIS — C3490 Malignant neoplasm of unspecified part of unspecified bronchus or lung: Secondary | ICD-10-CM | POA: Diagnosis not present

## 2021-08-28 DIAGNOSIS — I959 Hypotension, unspecified: Secondary | ICD-10-CM | POA: Insufficient documentation

## 2021-08-28 DIAGNOSIS — Z79899 Other long term (current) drug therapy: Secondary | ICD-10-CM | POA: Insufficient documentation

## 2021-08-28 DIAGNOSIS — C7951 Secondary malignant neoplasm of bone: Secondary | ICD-10-CM | POA: Insufficient documentation

## 2021-08-28 DIAGNOSIS — Z85828 Personal history of other malignant neoplasm of skin: Secondary | ICD-10-CM | POA: Diagnosis not present

## 2021-08-28 DIAGNOSIS — Z7189 Other specified counseling: Secondary | ICD-10-CM

## 2021-08-28 DIAGNOSIS — C3412 Malignant neoplasm of upper lobe, left bronchus or lung: Secondary | ICD-10-CM | POA: Insufficient documentation

## 2021-08-28 DIAGNOSIS — Z8551 Personal history of malignant neoplasm of bladder: Secondary | ICD-10-CM | POA: Insufficient documentation

## 2021-08-28 DIAGNOSIS — F17210 Nicotine dependence, cigarettes, uncomplicated: Secondary | ICD-10-CM | POA: Diagnosis not present

## 2021-08-28 DIAGNOSIS — Z5112 Encounter for antineoplastic immunotherapy: Secondary | ICD-10-CM | POA: Diagnosis present

## 2021-08-28 DIAGNOSIS — I252 Old myocardial infarction: Secondary | ICD-10-CM | POA: Diagnosis not present

## 2021-08-28 DIAGNOSIS — C3492 Malignant neoplasm of unspecified part of left bronchus or lung: Secondary | ICD-10-CM

## 2021-08-28 DIAGNOSIS — Z95828 Presence of other vascular implants and grafts: Secondary | ICD-10-CM

## 2021-08-28 LAB — CBC WITH DIFFERENTIAL (CANCER CENTER ONLY)
Abs Immature Granulocytes: 0.06 10*3/uL (ref 0.00–0.07)
Basophils Absolute: 0 10*3/uL (ref 0.0–0.1)
Basophils Relative: 0 %
Eosinophils Absolute: 0.3 10*3/uL (ref 0.0–0.5)
Eosinophils Relative: 2 %
HCT: 34.2 % — ABNORMAL LOW (ref 39.0–52.0)
Hemoglobin: 11.3 g/dL — ABNORMAL LOW (ref 13.0–17.0)
Immature Granulocytes: 1 %
Lymphocytes Relative: 5 %
Lymphs Abs: 0.5 10*3/uL — ABNORMAL LOW (ref 0.7–4.0)
MCH: 32.3 pg (ref 26.0–34.0)
MCHC: 33 g/dL (ref 30.0–36.0)
MCV: 97.7 fL (ref 80.0–100.0)
Monocytes Absolute: 0.8 10*3/uL (ref 0.1–1.0)
Monocytes Relative: 7 %
Neutro Abs: 10.4 10*3/uL — ABNORMAL HIGH (ref 1.7–7.7)
Neutrophils Relative %: 85 %
Platelet Count: 252 10*3/uL (ref 150–400)
RBC: 3.5 MIL/uL — ABNORMAL LOW (ref 4.22–5.81)
RDW: 12.3 % (ref 11.5–15.5)
WBC Count: 12.1 10*3/uL — ABNORMAL HIGH (ref 4.0–10.5)
nRBC: 0 % (ref 0.0–0.2)

## 2021-08-28 LAB — CMP (CANCER CENTER ONLY)
ALT: 26 U/L (ref 0–44)
AST: 23 U/L (ref 15–41)
Albumin: 3.4 g/dL — ABNORMAL LOW (ref 3.5–5.0)
Alkaline Phosphatase: 56 U/L (ref 38–126)
Anion gap: 6 (ref 5–15)
BUN: 19 mg/dL (ref 8–23)
CO2: 29 mmol/L (ref 22–32)
Calcium: 8.8 mg/dL — ABNORMAL LOW (ref 8.9–10.3)
Chloride: 103 mmol/L (ref 98–111)
Creatinine: 0.84 mg/dL (ref 0.61–1.24)
GFR, Estimated: >60 mL/min (ref >60)
Glucose, Bld: 97 mg/dL (ref 70–99)
Potassium: 4.4 mmol/L (ref 3.5–5.1)
Sodium: 138 mmol/L (ref 135–145)
Total Bilirubin: 0.4 mg/dL (ref 0.3–1.2)
Total Protein: 6.5 g/dL (ref 6.5–8.1)

## 2021-08-28 MED ORDER — METOPROLOL TARTRATE 25 MG PO TABS: 12.5000 mg | ORAL_TABLET | Freq: Two times a day (BID) | ORAL | 6 refills | Status: DC

## 2021-08-28 MED ORDER — HEPARIN SOD (PORK) LOCK FLUSH 100 UNIT/ML IV SOLN
500.0000 [IU] | Freq: Once | INTRAVENOUS | Status: AC | PRN
  Administered 2021-08-28: 14:00:00 500 [IU]

## 2021-08-28 MED ORDER — SODIUM CHLORIDE 0.9 % IV SOLN
200.0000 mg | Freq: Once | INTRAVENOUS | Status: AC
  Administered 2021-08-28: 13:00:00 200 mg via INTRAVENOUS
  Filled 2021-08-28: qty 8

## 2021-08-28 MED ORDER — FAMOTIDINE 20 MG IN NS 100 ML IVPB
20.0000 mg | Freq: Once | INTRAVENOUS | Status: AC
  Administered 2021-08-28: 13:00:00 20 mg via INTRAVENOUS
  Filled 2021-08-28: qty 100

## 2021-08-28 MED ORDER — SODIUM CHLORIDE 0.9% FLUSH
10.0000 mL | INTRAVENOUS | Status: AC | PRN
  Administered 2021-08-28: 11:00:00 10 mL

## 2021-08-28 MED ORDER — SODIUM CHLORIDE 0.9 % IV SOLN
Freq: Once | INTRAVENOUS | Status: AC
Start: 2021-08-28 — End: 2021-08-28

## 2021-08-28 MED ORDER — DIPHENHYDRAMINE HCL 50 MG/ML IJ SOLN
25.0000 mg | Freq: Once | INTRAMUSCULAR | Status: AC
  Administered 2021-08-28: 12:00:00 25 mg via INTRAVENOUS
  Filled 2021-08-28: qty 1

## 2021-08-28 MED ORDER — SODIUM CHLORIDE 0.9% FLUSH
10.0000 mL | INTRAVENOUS | Status: DC | PRN
  Administered 2021-08-28: 14:00:00 10 mL

## 2021-08-28 NOTE — Patient Instructions (Addendum)
[facility]  Discharge Instructions: Thank you for choosing [facility] to provide your oncology and hematology care.   If you have a lab appointment with the [facility], please go directly to the [facility] and check in at the registration area.   Wear comfortable clothing and clothing appropriate for easy access to any Portacath or PICC line.   We strive to give you quality time with your provider. You may need to reschedule your appointment if you arrive late (15 or more minutes).  Arriving late affects you and other patients whose appointments are after yours.  Also, if you miss three or more appointments without notifying the office, you may be dismissed from the clinic at the providers discretion.      For prescription refill requests, have your pharmacy contact our office and allow 72 hours for refills to be completed.    Today you received the following chemotherapy and/or immunotherapy agents: Keytruda.      To help prevent nausea and vomiting after your treatment, we encourage you to take your nausea medication as directed.  BELOW ARE SYMPTOMS THAT SHOULD BE REPORTED IMMEDIATELY: *FEVER GREATER THAN 100.4 F (38 C) OR HIGHER *CHILLS OR SWEATING *NAUSEA AND VOMITING THAT IS NOT CONTROLLED WITH YOUR NAUSEA MEDICATION *UNUSUAL SHORTNESS OF BREATH *UNUSUAL BRUISING OR BLEEDING *URINARY PROBLEMS (pain or burning when urinating, or frequent urination) *BOWEL PROBLEMS (unusual diarrhea, constipation, pain near the anus) TENDERNESS IN MOUTH AND THROAT WITH OR WITHOUT PRESENCE OF ULCERS (sore throat, sores in mouth, or a toothache) UNUSUAL RASH, SWELLING OR PAIN  UNUSUAL VAGINAL DISCHARGE OR ITCHING   Items with * indicate a potential emergency and should be followed up as soon as possible or go to the Emergency Department if any problems should occur.  Please show the CHEMOTHERAPY ALERT CARD or IMMUNOTHERAPY ALERT CARD at check-in to  the Emergency Department and triage nurse.  Should you have questions after your visit or need to cancel or reschedule your appointment, please contact [facility]  Dept: [phone]  and follow the prompts.  Office hours are 8:00 a.m. to 4:30 p.m. Monday - Friday. Please note that voicemails left after 4:00 p.m. may not be returned until the following business day.  We are closed weekends and major holidays. You have access to a nurse at all times for urgent questions. Please call the main number to the clinic Dept: [phone] and follow the prompts.   For any non-urgent questions, you may also contact your provider using MyChart. We now offer e-Visits for anyone 28 and older to request care online for non-urgent symptoms. For details visit [email or web address].   Also download the MyChart app! Go to the app store, search "MyChart", open the app, select [facility], and log in with your MyChart username and password.  Due to Covid, a mask is required upon entering the hospital/clinic. If you do not have a mask, one will be given to you upon arrival. For doctor visits, patients may have 1 support person aged 58 or older with them. For treatment visits, patients cannot have anyone with them due to current Covid guidelines and our immunocompromised population.   Pembrolizumab injection What is this medication? PEMBROLIZUMAB (pem broe liz ue mab) is a monoclonal antibody. It is used to treat certain types of cancer. This medicine may be used for other purposes; ask your health care provider or pharmacist if you have questions. COMMON BRAND NAME(S): Keytruda What  should I tell my care team before I take this medication? They need to know if you have any of these conditions: autoimmune diseases like Crohn's disease, ulcerative colitis, or lupus have had or planning to have an allogeneic stem cell transplant (uses someone else's stem cells) history of organ  transplant history of chest radiation nervous system problems like myasthenia gravis or Guillain-Barre syndrome an unusual or allergic reaction to pembrolizumab, other medicines, foods, dyes, or preservatives pregnant or trying to get pregnant breast-feeding How should I use this medication? This medicine is for infusion into a vein. It is given by a health care professional in a hospital or clinic setting. A special MedGuide will be given to you before each treatment. Be sure to read this information carefully each time. Talk to your pediatrician regarding the use of this medicine in children. While this drug may be prescribed for children as young as 6 months for selected conditions, precautions do apply. Overdosage: If you think you have taken too much of this medicine contact a poison control center or emergency room at once. NOTE: This medicine is only for you. Do not share this medicine with others. What if I miss a dose? It is important not to miss your dose. Call your doctor or health care professional if you are unable to keep an appointment. What may interact with this medication? Interactions have not been studied. This list may not describe all possible interactions. Give your health care provider a list of all the medicines, herbs, non-prescription drugs, or dietary supplements you use. Also tell them if you smoke, drink alcohol, or use illegal drugs. Some items may interact with your medicine. What should I watch for while using this medication? Your condition will be monitored carefully while you are receiving this medicine. You may need blood work done while you are taking this medicine. Do not become pregnant while taking this medicine or for 4 months after stopping it. Women should inform their doctor if they wish to become pregnant or think they might be pregnant. There is a potential for serious side effects to an unborn child. Talk to your health care professional or  pharmacist for more information. Do not breast-feed an infant while taking this medicine or for 4 months after the last dose. What side effects may I notice from receiving this medication? Side effects that you should report to your doctor or health care professional as soon as possible: allergic reactions like skin rash, itching or hives, swelling of the face, lips, or tongue bloody or black, tarry breathing problems changes in vision chest pain chills confusion constipation cough diarrhea dizziness or feeling faint or lightheaded fast or irregular heartbeat fever flushing joint pain low blood counts - this medicine may decrease the number of white blood cells, red blood cells and platelets. You may be at increased risk for infections and bleeding. muscle pain muscle weakness pain, tingling, numbness in the hands or feet persistent headache redness, blistering, peeling or loosening of the skin, including inside the mouth signs and symptoms of high blood sugar such as dizziness; dry mouth; dry skin; fruity breath; nausea; stomach pain; increased hunger or thirst; increased urination signs and symptoms of kidney injury like trouble passing urine or change in the amount of urine signs and symptoms of liver injury like dark urine, light-colored stools, loss of appetite, nausea, right upper belly pain, yellowing of the eyes or skin sweating swollen lymph nodes weight loss Side effects that usually do not require medical  attention (report to your doctor or health care professional if they continue or are bothersome): decreased appetite hair loss tiredness This list may not describe all possible side effects. Call your doctor for medical advice about side effects. You may report side effects to FDA at 1-800-FDA-1088. Where should I keep my medication? This drug is given in a hospital or clinic and will not be stored at home. NOTE: This sheet is a summary. It may not cover all possible  information. If you have questions about this medicine, talk to your doctor, pharmacist, or health care provider.  2022 [employer] Standard (2021-05-14 00:00:00)

## 2021-08-29 ENCOUNTER — Other Ambulatory Visit: Payer: Self-pay | Admitting: Cardiovascular Disease

## 2021-08-29 ENCOUNTER — Encounter: Payer: Self-pay | Admitting: Hematology

## 2021-08-29 NOTE — Assessment & Plan Note (Addendum)
Kyle Foster is a 75 year old pleasant male with metastatic lung cancer on Keytruda maintenance therapy.  1.  Metastatic lung cancer.  He will proceed with treatment with Keytruda today.  His labs thus far are stable.  His c-Met is not back, so he will proceed so long as liver enzymes are within treatment parameters.  I added TSH to his lab orders to be followed.  2.  Hypotension.  He is taking metoprolol 25 mg p.o. twice daily I have recommended that he cut this back to 12.5 mg p.o. twice daily.  He is not to take it if his blood pressure is low or if he feels dizzy.  Should he continue to have blood pressure issues in spite of decreasing metoprolol, we may need to revisit the potential of treatment related hypotension from the [facility].  Kyle Foster will return in 3 weeks for his next treatment.  He sees Dr. [name] in 6 weeks.  He is due for scans prior to his visit with Dr. [name], I ordered a CT chest to be completed a week before his follow-up visit.
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
## 2021-09-03 ENCOUNTER — Other Ambulatory Visit: Payer: Self-pay | Admitting: Family

## 2021-09-03 DIAGNOSIS — J449 Chronic obstructive pulmonary disease, unspecified: Secondary | ICD-10-CM

## 2021-09-10 ENCOUNTER — Telehealth: Payer: Self-pay

## 2021-09-10 ENCOUNTER — Other Ambulatory Visit: Payer: Self-pay

## 2021-09-10 DIAGNOSIS — J449 Chronic obstructive pulmonary disease, unspecified: Secondary | ICD-10-CM

## 2021-09-10 MED ORDER — TRELEGY ELLIPTA 100-62.5-25 MCG/ACT IN AEPB: 1.0000 | INHALATION_SPRAY | Freq: Every day | RESPIRATORY_TRACT | 1 refills | Status: DC

## 2021-09-10 NOTE — Telephone Encounter (Signed)
Caller name:Agron Odai Wimmer callback [phone]  Encourage patient to contact the pharmacy for refills or they can request refills through [facility]  (Please schedule appointment if patient has not been seen in over a year)  MEDICATION NAME & DOSE:Fluticasone-Umeclidin-Vilant (TRELEGY ELLIPTA) 100-62.5-25 MCG/ACT AEPB [[id number]] ,   Notes/Comments from patient:Pt saw Jeanie Sewer she said to come back to ongoing provider for refills   WHAT [place] TO: [id number] - [place], [place] - [address]   Please notify patient: It takes 48-72 hours to process rx refill requests Ask patient to call pharmacy to ensure rx is ready before heading there.   (CLINICAL TO FILL OR ROUTE PER PROTOCOLS)

## 2021-09-10 NOTE — Telephone Encounter (Signed)
Refill sent.

## 2021-09-15 IMAGING — CT CT CHEST W/O CM
2 of 4 series · 15 of 36 positions shown, 18 images · non-contrast
Comparison: PET-CT dated [DATE]st 8388

CLINICAL DATA: Non-small cell lung cancer

EXAM:
CT CHEST WITHOUT CONTRAST
TECHNIQUE: Multidetector CT imaging of the chest was performed following the
standard protocol without IV contrast.

[Series 2: thorax · axial · 0.74mm/px · z∈[-348,-44]mm · 12 of 180 slices shown, 15 images]
[im 14/180  mediastinal]
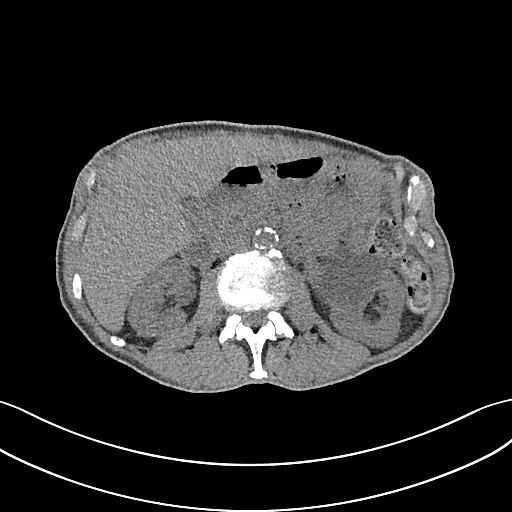
[im 14/180  lung]
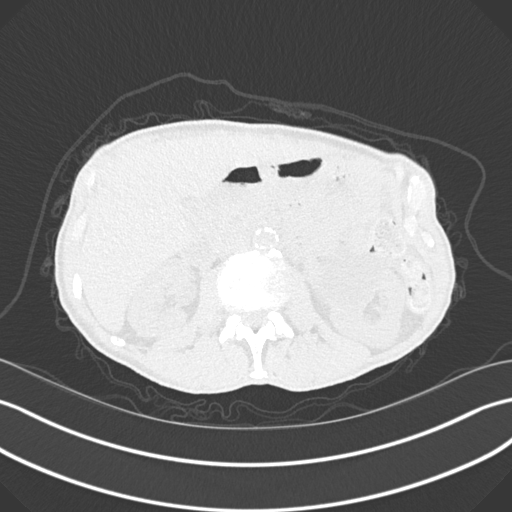
[im 28/180  lung]
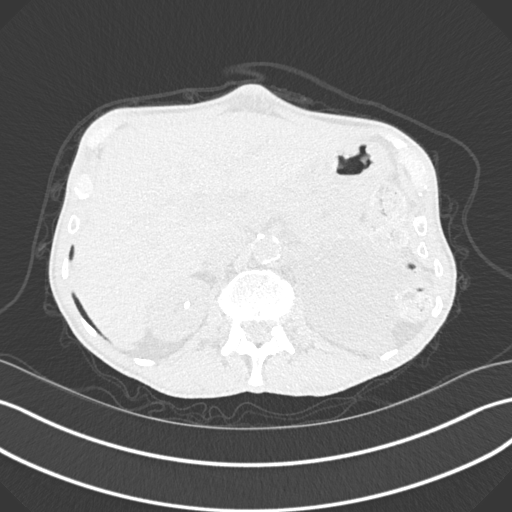
[im 42/180  lung]
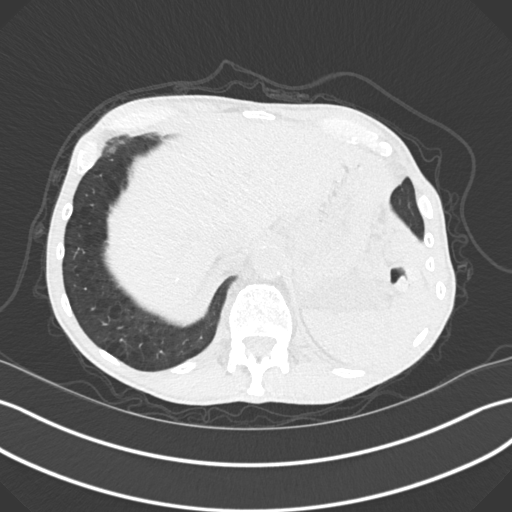
[im 56/180  lung]
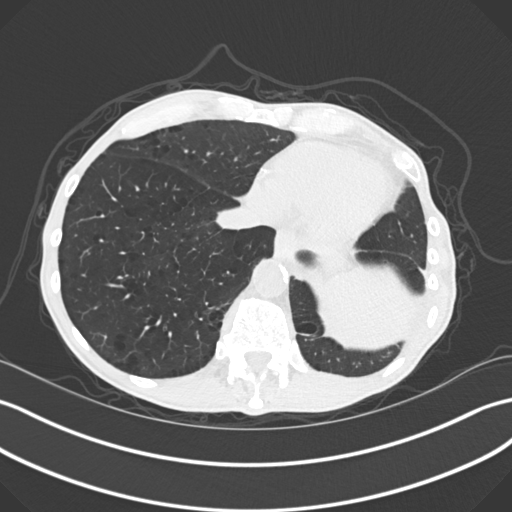
[im 69/180  mediastinal]
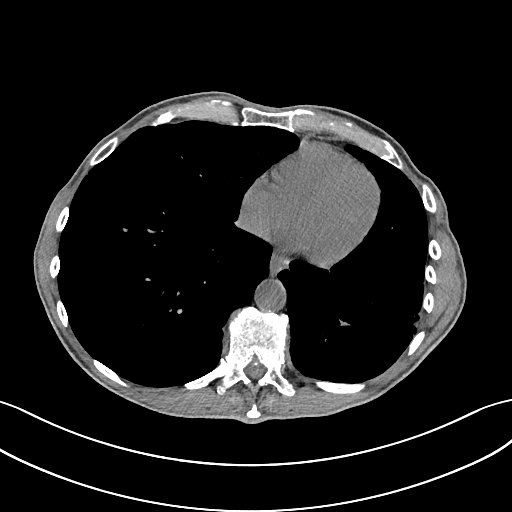
[im 69/180  lung]
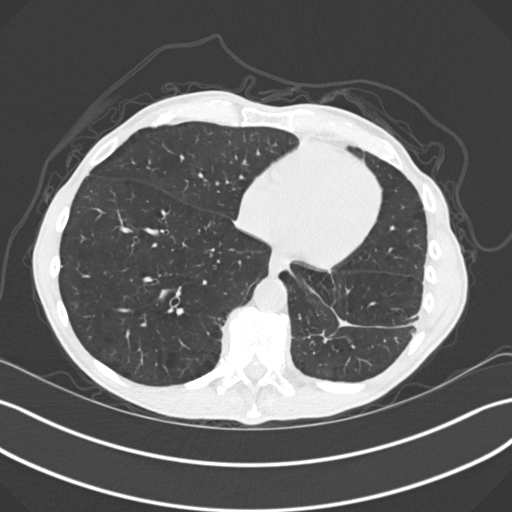
[im 83/180  lung]
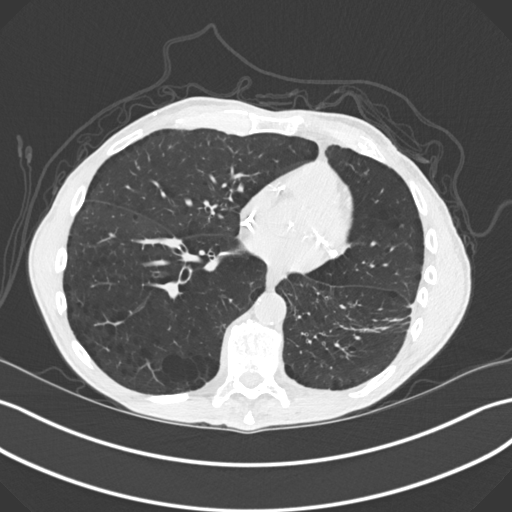
[im 97/180  lung]
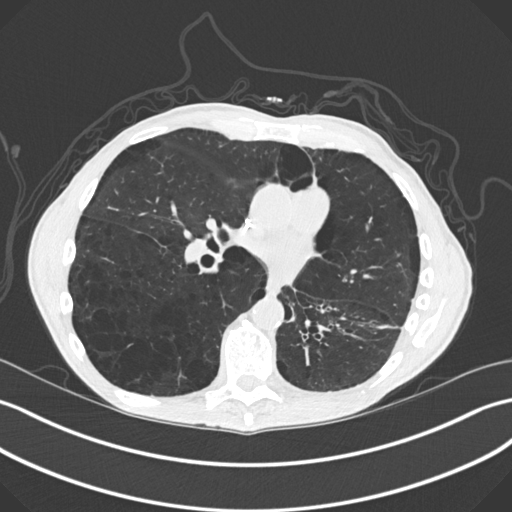
[im 111/180  lung]
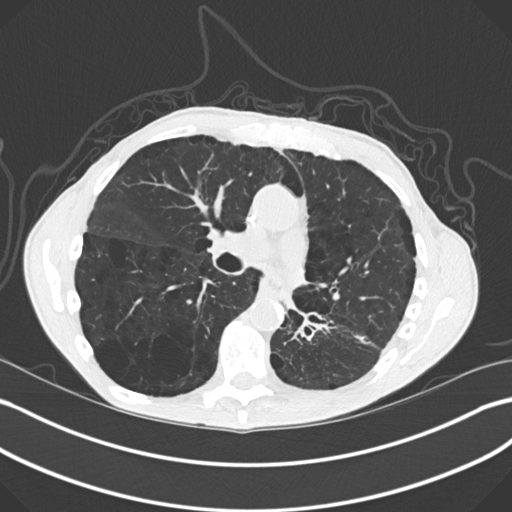
[im 124/180  mediastinal]
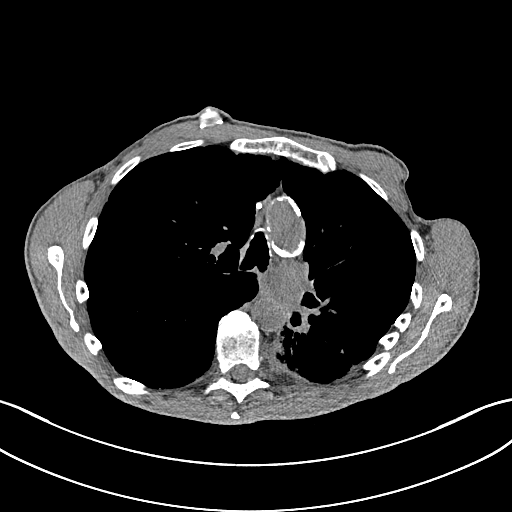
[im 124/180  lung]
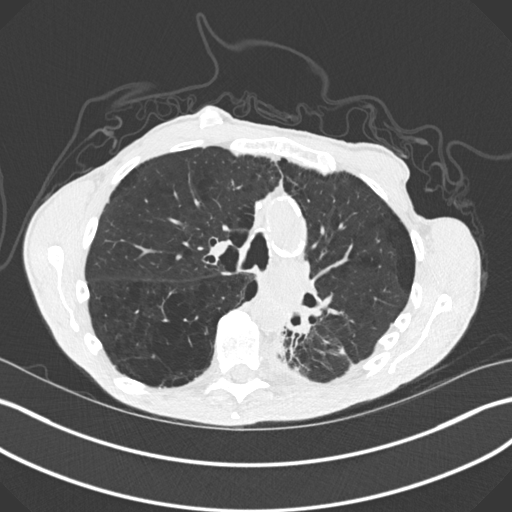
[im 138/180  lung]
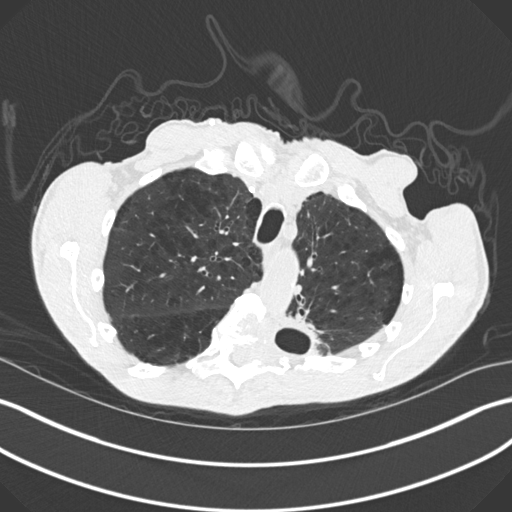
[im 152/180  lung]
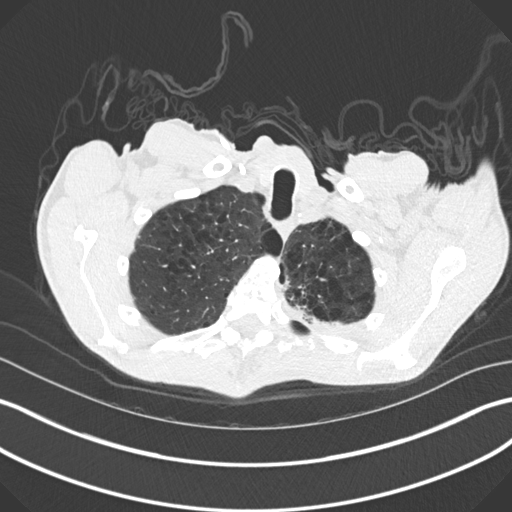
[im 166/180  lung]
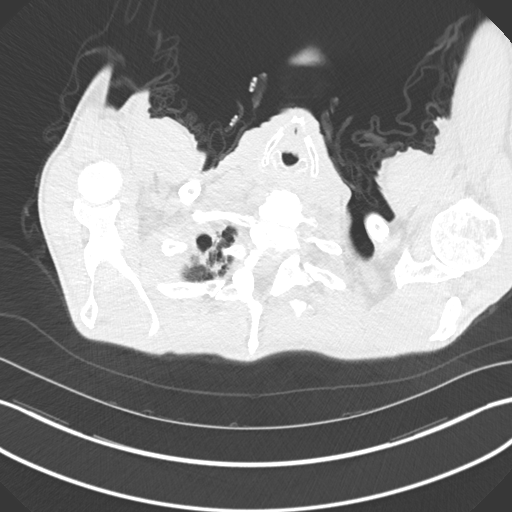

[Series 6: coronal · coronal · 0.71mm/px · 3 of 137 slices shown]
[im 28/137  lung]
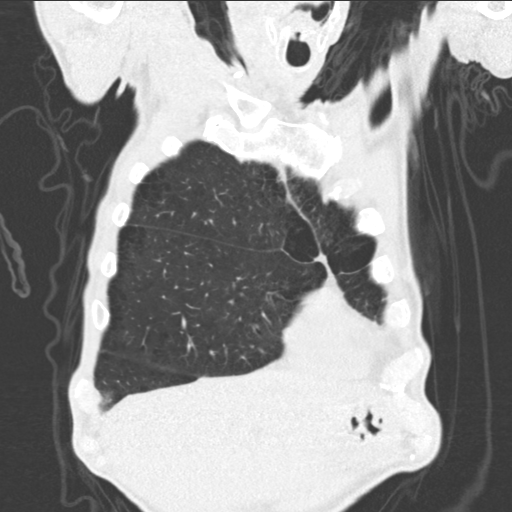
[im 55/137  lung]
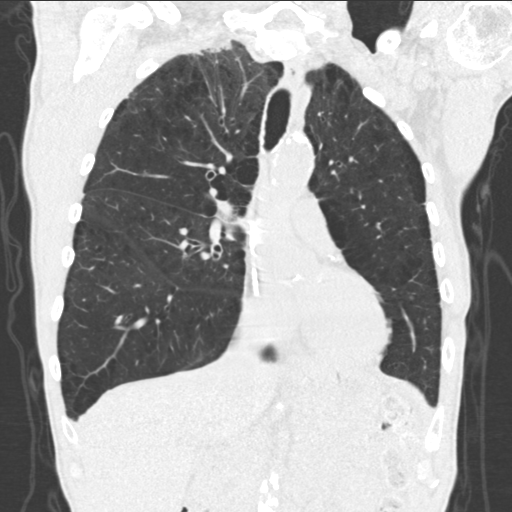
[im 82/137  lung]
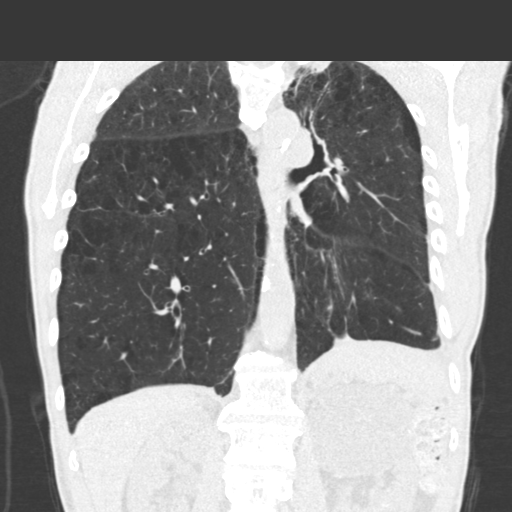

[15 of 36 positions shown; findings below may reference images not displayed]

FINDINGS: Cardiovascular: Normal heart size. No pericardial effusion.
Three-vessel coronary artery calcifications. Atherosclerotic disease
of the thoracic aorta.

Mediastinum/Nodes: No pathologically enlarged lymph nodes seen in
the chest. Esophagus and thyroid are unremarkable.

Lungs/Pleura: Small amount of debris noted in the left mainstem
bronchus. Severe upper lobe predominant centrilobular emphysema.
Left lower lobe linear opacities with traction bronchiectasis,
likely post radiation change. Redemonstrated area of cystic change
with slight increased thickening soft tissue thickening around the
periphery. No new or enlarging pulmonary nodules.

Upper Abdomen: Nonobstructing stone of the right kidney. Simple cyst
of the left kidney. No acute findings.

Musculoskeletal: Lytic destruction of the T5 and T6 vertebral bodies
and left fifth and sixth ribs, unchanged compared to prior.
IMPRESSION: Posttreatment changes in the left hemithorax with slight increased
soft tissue thickening about an area of cystic change. Possibly
evolving posttreatment changes, although recurrence disease cannot
be excluded. Recommend attention on follow-up.

Lytic destruction of the T5 and T6 vertebral bodies and left fifth
and sixth ribs, unchanged compared to prior.

Aortic Atherosclerosis (SEQ1R-6GU.U) and Emphysema ([id number].F).

## 2021-09-18 ENCOUNTER — Inpatient Hospital Stay: Payer: No Typology Code available for payment source | Attending: Hematology

## 2021-09-18 ENCOUNTER — Inpatient Hospital Stay: Payer: No Typology Code available for payment source

## 2021-09-18 ENCOUNTER — Other Ambulatory Visit: Payer: Self-pay

## 2021-09-18 VITALS — BP 115/71 | HR 72 | Temp 98.7°F | Resp 16 | Wt 130.5 lb

## 2021-09-18 DIAGNOSIS — Z5112 Encounter for antineoplastic immunotherapy: Secondary | ICD-10-CM | POA: Insufficient documentation

## 2021-09-18 DIAGNOSIS — C3492 Malignant neoplasm of unspecified part of left bronchus or lung: Secondary | ICD-10-CM

## 2021-09-18 DIAGNOSIS — F17210 Nicotine dependence, cigarettes, uncomplicated: Secondary | ICD-10-CM | POA: Diagnosis not present

## 2021-09-18 DIAGNOSIS — Z7189 Other specified counseling: Secondary | ICD-10-CM

## 2021-09-18 DIAGNOSIS — C3412 Malignant neoplasm of upper lobe, left bronchus or lung: Secondary | ICD-10-CM | POA: Insufficient documentation

## 2021-09-18 DIAGNOSIS — Z79899 Other long term (current) drug therapy: Secondary | ICD-10-CM | POA: Insufficient documentation

## 2021-09-18 DIAGNOSIS — C7951 Secondary malignant neoplasm of bone: Secondary | ICD-10-CM | POA: Diagnosis present

## 2021-09-18 DIAGNOSIS — Z95828 Presence of other vascular implants and grafts: Secondary | ICD-10-CM

## 2021-09-18 DIAGNOSIS — C3490 Malignant neoplasm of unspecified part of unspecified bronchus or lung: Secondary | ICD-10-CM

## 2021-09-18 LAB — CBC WITH DIFFERENTIAL (CANCER CENTER ONLY)
Abs Immature Granulocytes: 0.02 10*3/uL (ref 0.00–0.07)
Basophils Absolute: 0 10*3/uL (ref 0.0–0.1)
Basophils Relative: 0 %
Eosinophils Absolute: 0.4 10*3/uL (ref 0.0–0.5)
Eosinophils Relative: 5 %
HCT: 34 % — ABNORMAL LOW (ref 39.0–52.0)
Hemoglobin: 11.2 g/dL — ABNORMAL LOW (ref 13.0–17.0)
Immature Granulocytes: 0 %
Lymphocytes Relative: 8 %
Lymphs Abs: 0.7 10*3/uL (ref 0.7–4.0)
MCH: 32.3 pg (ref 26.0–34.0)
MCHC: 32.9 g/dL (ref 30.0–36.0)
MCV: 98 fL (ref 80.0–100.0)
Monocytes Absolute: 0.8 10*3/uL (ref 0.1–1.0)
Monocytes Relative: 10 %
Neutro Abs: 6.6 10*3/uL (ref 1.7–7.7)
Neutrophils Relative %: 77 %
Platelet Count: 183 10*3/uL (ref 150–400)
RBC: 3.47 MIL/uL — ABNORMAL LOW (ref 4.22–5.81)
RDW: 13.4 % (ref 11.5–15.5)
WBC Count: 8.6 10*3/uL (ref 4.0–10.5)
nRBC: 0 % (ref 0.0–0.2)

## 2021-09-18 LAB — CMP (CANCER CENTER ONLY)
ALT: 16 U/L (ref 0–44)
AST: 20 U/L (ref 15–41)
Albumin: 3.6 g/dL (ref 3.5–5.0)
Alkaline Phosphatase: 58 U/L (ref 38–126)
Anion gap: 5 (ref 5–15)
BUN: 21 mg/dL (ref 8–23)
CO2: 27 mmol/L (ref 22–32)
Calcium: 8.8 mg/dL — ABNORMAL LOW (ref 8.9–10.3)
Chloride: 106 mmol/L (ref 98–111)
Creatinine: 0.82 mg/dL (ref 0.61–1.24)
GFR, Estimated: >60 mL/min (ref >60)
Glucose, Bld: 83 mg/dL (ref 70–99)
Potassium: 4.6 mmol/L (ref 3.5–5.1)
Sodium: 138 mmol/L (ref 135–145)
Total Bilirubin: 0.4 mg/dL (ref 0.3–1.2)
Total Protein: 6.5 g/dL (ref 6.5–8.1)

## 2021-09-18 LAB — TSH: TSH: 1.628 u[IU]/mL (ref 0.320–4.118)

## 2021-09-18 MED ORDER — FENTANYL 50 MCG/HR TD PT72: 1.0000 | MEDICATED_PATCH | TRANSDERMAL | 0 refills | Status: DC

## 2021-09-18 MED ORDER — HEPARIN SOD (PORK) LOCK FLUSH 100 UNIT/ML IV SOLN
500.0000 [IU] | Freq: Once | INTRAVENOUS | Status: AC | PRN
  Administered 2021-09-18: 500 [IU]

## 2021-09-18 MED ORDER — ONDANSETRON HCL 8 MG PO TABS: 8.0000 mg | ORAL_TABLET | Freq: Three times a day (TID) | ORAL | 2 refills | Status: DC | PRN

## 2021-09-18 MED ORDER — SODIUM CHLORIDE 0.9 % IV SOLN: Freq: Once | INTRAVENOUS | Status: AC

## 2021-09-18 MED ORDER — SODIUM CHLORIDE 0.9 % IV SOLN
200.0000 mg | Freq: Once | INTRAVENOUS | Status: AC
  Administered 2021-09-18: 200 mg via INTRAVENOUS
  Filled 2021-09-18: qty 8

## 2021-09-18 MED ORDER — SODIUM CHLORIDE 0.9% FLUSH
10.0000 mL | INTRAVENOUS | Status: AC | PRN
  Administered 2021-09-18: 10 mL

## 2021-09-18 MED ORDER — MORPHINE SULFATE 15 MG PO TABS: 15.0000 mg | ORAL_TABLET | ORAL | 0 refills | Status: DC | PRN

## 2021-09-18 MED ORDER — SODIUM CHLORIDE 0.9% FLUSH
10.0000 mL | INTRAVENOUS | Status: DC | PRN
  Administered 2021-09-18: 10 mL

## 2021-09-18 MED ORDER — DIPHENHYDRAMINE HCL 50 MG/ML IJ SOLN
25.0000 mg | Freq: Once | INTRAMUSCULAR | Status: AC
  Administered 2021-09-18: 25 mg via INTRAVENOUS
  Filled 2021-09-18: qty 1

## 2021-09-18 MED ORDER — FAMOTIDINE 20 MG IN NS 100 ML IVPB
20.0000 mg | Freq: Once | INTRAVENOUS | Status: AC
  Administered 2021-09-18: 20 mg via INTRAVENOUS
  Filled 2021-09-18: qty 100

## 2021-09-18 NOTE — Patient Instructions (Signed)
[facility]   Discharge Instructions: Thank you for choosing [facility] to provide your oncology and hematology care.   If you have a lab appointment with the [facility], please go directly to the [facility] and check in at the registration area.   Wear comfortable clothing and clothing appropriate for easy access to any Portacath or PICC line.   We strive to give you quality time with your provider. You may need to reschedule your appointment if you arrive late (15 or more minutes).  Arriving late affects you and other patients whose appointments are after yours.  Also, if you miss three or more appointments without notifying the office, you may be dismissed from the clinic at the providers discretion.      For prescription refill requests, have your pharmacy contact our office and allow 72 hours for refills to be completed.    Today you received the following chemotherapy and/or immunotherapy agents: pembrolizumab      To help prevent nausea and vomiting after your treatment, we encourage you to take your nausea medication as directed.  BELOW ARE SYMPTOMS THAT SHOULD BE REPORTED IMMEDIATELY: *FEVER GREATER THAN 100.4 F (38 C) OR HIGHER *CHILLS OR SWEATING *NAUSEA AND VOMITING THAT IS NOT CONTROLLED WITH YOUR NAUSEA MEDICATION *UNUSUAL SHORTNESS OF BREATH *UNUSUAL BRUISING OR BLEEDING *URINARY PROBLEMS (pain or burning when urinating, or frequent urination) *BOWEL PROBLEMS (unusual diarrhea, constipation, pain near the anus) TENDERNESS IN MOUTH AND THROAT WITH OR WITHOUT PRESENCE OF ULCERS (sore throat, sores in mouth, or a toothache) UNUSUAL RASH, SWELLING OR PAIN  UNUSUAL VAGINAL DISCHARGE OR ITCHING   Items with * indicate a potential emergency and should be followed up as soon as possible or go to the Emergency Department if any problems should occur.  Please show the CHEMOTHERAPY ALERT CARD or IMMUNOTHERAPY ALERT CARD at  check-in to the Emergency Department and triage nurse.  Should you have questions after your visit or need to cancel or reschedule your appointment, please contact [facility]  Dept: [phone]  and follow the prompts.  Office hours are 8:00 a.m. to 4:30 p.m. Monday - Friday. Please note that voicemails left after 4:00 p.m. may not be returned until the following business day.  We are closed weekends and major holidays. You have access to a nurse at all times for urgent questions. Please call the main number to the clinic Dept: [phone] and follow the prompts.   For any non-urgent questions, you may also contact your provider using MyChart. We now offer e-Visits for anyone 75 and older to request care online for non-urgent symptoms. For details visit [email or web address].   Also download the MyChart app! Go to the app store, search "MyChart", open the app, select [facility], and log in with your MyChart username and password.  Due to Covid, a mask is required upon entering the hospital/clinic. If you do not have a mask, one will be given to you upon arrival. For doctor visits, patients may have 1 support person aged 75 or older with them. For treatment visits, patients cannot have anyone with them due to current Covid guidelines and our immunocompromised population.

## 2021-09-27 ENCOUNTER — Encounter: Payer: Self-pay | Admitting: Registered Nurse

## 2021-09-27 ENCOUNTER — Ambulatory Visit (INDEPENDENT_AMBULATORY_CARE_PROVIDER_SITE_OTHER): Payer: Medicare Other | Admitting: Registered Nurse

## 2021-09-27 VITALS — BP 110/50 | HR 81 | Temp 98.4°F | Ht 70.0 in | Wt 125.4 lb

## 2021-09-27 DIAGNOSIS — Z8639 Personal history of other endocrine, nutritional and metabolic disease: Secondary | ICD-10-CM

## 2021-09-27 DIAGNOSIS — H66002 Acute suppurative otitis media without spontaneous rupture of ear drum, left ear: Secondary | ICD-10-CM | POA: Diagnosis not present

## 2021-09-27 DIAGNOSIS — Z13220 Encounter for screening for lipoid disorders: Secondary | ICD-10-CM

## 2021-09-27 DIAGNOSIS — I2510 Atherosclerotic heart disease of native coronary artery without angina pectoris: Secondary | ICD-10-CM | POA: Diagnosis not present

## 2021-09-27 DIAGNOSIS — Z9861 Coronary angioplasty status: Secondary | ICD-10-CM | POA: Diagnosis not present

## 2021-09-27 MED ORDER — AMOXICILLIN-POT CLAVULANATE 875-125 MG PO TABS: 1.0000 | ORAL_TABLET | Freq: Two times a day (BID) | ORAL | 0 refills | Status: DC

## 2021-09-27 MED ORDER — HYDROCORTISONE-ACETIC ACID 1-2 % OT SOLN: 3.0000 [drp] | Freq: Three times a day (TID) | OTIC | 0 refills | Status: DC | PRN

## 2021-09-27 NOTE — Progress Notes (Signed)
Established Patient Office Visit  Subjective:  Patient ID: Kyle Foster, male    DOB: February 19, 1946  Age: 76 y.o. MRN: [id number]  CC:  Chief Complaint  Patient presents with   Follow-up    6 mo/lft ear pain     HPI Kyle Foster presents for 6 mo follow up, L ear pain  Continues on keytruda q3w with good tolerance for scc of lung.  Followed by [facility].   Hypertension: Patient Currently taking: metoprolol 12.5mg  po bid  Good effect. No AEs. Denies CV symptoms including: chest pain, shob, doe, headache, visual changes, fatigue, claudication, and dependent edema.   Previous readings and labs: BP Readings from Last 3 Encounters:  09/27/21 (!) 110/50  09/18/21 115/71  08/28/21 112/61   Lab Results  Component Value Date   CREATININE 0.82 09/18/2021   HLD Takes atorvastatin 80mg  po qd Good effect, no AE Lab Results  Component Value Date   CHOL 142 09/27/2021   HDL 54 09/27/2021   LDLCALC 71 09/27/2021   TRIG 86 09/27/2021   CHOLHDL 2.6 09/27/2021    Left ear pain Onset 3 weeks ago Gets these frequency Starts with aching, then draining Now sore throat on and off. Headache with this.   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 immunotherapy    Past Surgical History:  Procedure Laterality Date   CATARACT EXTRACTION W/ INTRAOCULAR LENS IMPLANT Right 12/2007    CORONARY ANGIOPLASTY WITH STENT PLACEMENT  06/2008   "3 stents" (06/14/2018)   CYSTOSCOPY W/ RETROGRADES  09/22/2011   Procedure: CYSTOSCOPY WITH RETROGRADE PYELOGRAM;  Surgeon: Bernestine Amass, MD;  Location: [facility];  Service: Urology;  Laterality: Left;  Cystoscopy left Retrograde Pyelogram      (c-arm)    CYSTOSCOPY WITH BIOPSY  09/22/2011   Procedure: CYSTOSCOPY WITH BIOPSY;  Surgeon: Bernestine Amass, MD;  Location: [facility];  Service: Urology;  Laterality: N/A;   Biopsy   CYSTOSCOPY WITH BIOPSY N/A 04/19/2021   Procedure: CYSTOSCOPY WITH BLADDER  BIOPSY WITH FULGERATION;  Surgeon: [name], MD;  Location: [facility];  Service: Urology;  Laterality: N/A;   EXCISIONAL HEMORRHOIDECTOMY  ~ 2006   EYE SURGERY Left 04/2011   "reconstruction; gold weight in eye lid " (06/14/2018)   FOOT NEUROMA SURGERY Left    immunotherapy     INGUINAL HERNIA REPAIR Right 02/2018   IR IMAGING GUIDED PORT INSERTION  11/23/2018   NM MYOCAR PERF WALL MOTION  07/11/2008   MILD ISCHEMIA IN THE BASL INFERIOR, MID INFERIOR & APICAL INFERIOR REGIONS   SALIVARY GLAND SURGERY Left 04/2011   "squamous cell cancer attached to it; took the gland out"   SKIN CANCER EXCISION     "arms, hands, face, neck" (06/14/2018)   TRANSURETHRAL RESECTION OF BLADDER  TUMOR WITH GYRUS (TURBT-GYRUS)  2010    Family History  Problem Relation Age of Onset   Heart attack Mother 65   Cancer Mother        Lung   Diabetes Mother    Heart disease Mother    Hypertension Mother    Stroke Father 80   Cancer Father        Lung   Brain cancer Brother    Cancer Sister        Melanoma of great Toe   Hyperlipidemia Sister     Social History   Socioeconomic History   Marital status: Married    Spouse name: Not on file   Number of children: Not on file   Years of education: Not on file   Highest education level: Not on file  Occupational History   Not on file  Tobacco Use   Smoking status: Every Day    Packs/day: 0.50    Years:  52.00    Pack years: 26.00    Types: Cigarettes    Start date: 1968   Smokeless tobacco: Never   Tobacco comments:    03/21/19- smoking 10 cigs a day   Vaping Use   Vaping Use: Never used  Substance and Sexual Activity   Alcohol use: Never   Drug use: Never   Sexual activity: Not on file  Other Topics Concern   Not on file  Social History Narrative   Not on file   Social Determinants of Health   Financial Resource Strain: Not on file  Food Insecurity: Not on file  Transportation Needs: Not on file  Physical Activity: Not on file  Stress: Not on file  Social Connections: Not on file  Intimate Partner Violence: Not on file    Outpatient Medications Prior to Visit  Medication Sig Dispense Refill   acetaminophen (TYLENOL) 500 MG tablet Take 1,000 mg by mouth every 8 (eight) hours as needed for moderate pain.     atorvastatin (LIPITOR) 80 MG tablet Take 1 tablet (80 mg total) by mouth daily. NEEDS APPOINTMENT WITH DR Gwenlyn Found FOR FUTURE REFILLS 90 tablet 1   B Complex-C (B-COMPLEX WITH VITAMIN C) tablet Take 1 tablet by mouth daily.     calcium carbonate (TUMS - DOSED IN MG ELEMENTAL CALCIUM) 500 MG chewable tablet Chew 1 tablet (200 mg of elemental calcium total) by mouth 3 (three) times daily with meals. (Patient taking differently: Chew 1 tablet by mouth 3 (three) times daily as needed for indigestion.) 30 tablet 3   Cholecalciferol (VITAMIN D) 2000 UNITS tablet Take 2,000 Units by mouth daily.     clopidogrel (PLAVIX) 75 MG tablet Take 1 tablet (75 mg total) by mouth daily. NEEDS APPOINTMENT FOR FUTURE REFILLS 90 tablet 3   DENTA 5000 PLUS 1.1 % CREA dental cream Place 1 application onto teeth at bedtime.     DULoxetine (CYMBALTA) 30 MG capsule TAKE 1 CAPSULE BY MOUTH EVERY DAY 90 capsule 2   fentaNYL (DURAGESIC) 50 MCG/HR Place 1 patch onto the skin every 3 (three) days. 10 patch 0   Fluticasone-Umeclidin-Vilant (TRELEGY ELLIPTA) 100-62.5-25 MCG/ACT AEPB Inhale 1 puff into the  lungs daily. 1 each 1   Hypromellose (ARTIFICIAL TEARS OP) Place 1 drop into both eyes at bedtime.      lidocaine-prilocaine (EMLA) cream Apply 1 application topically as needed (access port). 30 g 3   loratadine (CLARITIN) 10 MG tablet Take 10 mg by mouth daily as needed for allergies.  magnesium oxide (MAG-OX) 400 MG tablet Take 1 tablet (400 mg total) by mouth daily. 90 tablet 0   metoprolol tartrate (LOPRESSOR) 25 MG tablet Take 0.5 tablets (12.5 mg total) by mouth 2 (two) times daily. 60 tablet 6   morphine (MSIR) 15 MG tablet Take 1-2 tablets (15-30 mg total) by mouth every 4 (four) hours as needed for moderate pain or severe pain. 120 tablet 0   ondansetron (ZOFRAN) 8 MG tablet Take 1 tablet (8 mg total) by mouth every 8 (eight) hours as needed for nausea or vomiting. 30 tablet 2   pantoprazole (PROTONIX) 40 MG tablet TAKE 1 TABLET BY MOUTH EVERY DAY (Patient taking differently: Take 40 mg by mouth daily.) 90 tablet 3   ipratropium-albuterol (DUONEB) 0.5-2.5 (3) MG/3ML SOLN Take 3 mLs by nebulization every 6 (six) hours as needed. Start with using nebulizer after awakening in the morning, mid-day, and then prior to bedtime for the first week-10 days then back off to as needed as congestion and wheezing improves. 360 mL 0   No facility-administered medications prior to visit.    Allergies  Allergen Reactions   Codeine Other (See Comments)    HEADACHE  headaches    ROS Review of Systems Per hpi    Objective:    Physical Exam Constitutional:      General: He is not in acute distress.    Appearance: Normal appearance. He is normal weight. He is not ill-appearing, toxic-appearing or diaphoretic.  HENT:     Right Ear: Hearing normal. No swelling or tenderness. No middle ear effusion.     Left Ear: Hearing normal. Swelling and tenderness present. A middle ear effusion is present.     Ears:     Comments: L canal obstructed by swelling. TM not visualized. Post surgical changes  noted with his hx of ca Cardiovascular:     Rate and Rhythm: Normal rate and regular rhythm.     Heart sounds: Normal heart sounds. No murmur heard.   No friction rub. No gallop.  Pulmonary:     Effort: Pulmonary effort is normal. No respiratory distress.     Breath sounds: Normal breath sounds. No stridor. No wheezing, rhonchi or rales.  Chest:     Chest wall: No tenderness.  Neurological:     General: No focal deficit present.     Mental Status: He is alert and oriented to person, place, and time. Mental status is at baseline.  Psychiatric:        Mood and Affect: Mood normal.        Behavior: Behavior normal.        Thought Content: Thought content normal.        Judgment: Judgment normal.    BP (!) 110/50 (BP Location: Left Arm, Patient Position: Sitting, Cuff Size: Normal)    Pulse 81    Temp 98.4 F (36.9 C) (Oral)    Ht 5\' 10"  (1.778 m)    Wt 125 lb 6.4 oz (56.9 kg)    SpO2 96%    BMI 17.99 kg/m  Wt Readings from Last 3 Encounters:  09/27/21 125 lb 6.4 oz (56.9 kg)  09/18/21 130 lb 8 oz (59.2 kg)  08/28/21 122 lb 12.8 oz (55.7 kg)     Health Maintenance Due  Topic Date Due   COVID-19 Vaccine (3 - Moderna risk series) 07/04/2021    There are no preventive care reminders to display for this patient.  Lab Results  Component Value Date  TSH 1.628 09/18/2021   Lab Results  Component Value Date   WBC 8.6 09/18/2021   HGB 11.2 (L) 09/18/2021   HCT 34.0 (L) 09/18/2021   MCV 98.0 09/18/2021   PLT 183 09/18/2021   Lab Results  Component Value Date   NA 138 09/18/2021   K 4.6 09/18/2021   CO2 27 09/18/2021   GLUCOSE 83 09/18/2021   BUN 21 09/18/2021   CREATININE 0.82 09/18/2021   BILITOT 0.4 09/18/2021   ALKPHOS 58 09/18/2021   AST 20 09/18/2021   ALT 16 09/18/2021   PROT 6.5 09/18/2021   ALBUMIN 3.6 09/18/2021   CALCIUM 8.8 (L) 09/18/2021   ANIONGAP 5 09/18/2021   Lab Results  Component Value Date   CHOL 142 09/27/2021   Lab Results  Component  Value Date   HDL 54 09/27/2021   Lab Results  Component Value Date   LDLCALC 71 09/27/2021   Lab Results  Component Value Date   TRIG 86 09/27/2021   Lab Results  Component Value Date   CHOLHDL 2.6 09/27/2021   Lab Results  Component Value Date   HGBA1C 5.7 (H) 09/27/2021      Assessment & Plan:   Problem List Items Addressed This Visit   None Visit Diagnoses     Non-recurrent acute suppurative otitis media of left ear without spontaneous rupture of tympanic membrane    -  Primary   Relevant Medications   amoxicillin-clavulanate (AUGMENTIN) 875-125 MG tablet   acetic acid-hydrocortisone (VOSOL-HC) OTIC solution   Lipid screening       Relevant Orders   Lipid panel (Completed)   History of elevated glucose       Relevant Orders   Hemoglobin A1c (Completed)       Meds ordered this encounter  Medications   amoxicillin-clavulanate (AUGMENTIN) 875-125 MG tablet    Sig: Take 1 tablet by mouth 2 (two) times daily.    Dispense:  20 tablet    Refill:  0    Order Specific Question:   Supervising Provider    Answer:   Carlota Raspberry, JEFFREY R [[id number]]   acetic acid-hydrocortisone (VOSOL-HC) OTIC solution    Sig: Place 3 drops into the left ear 3 (three) times daily as needed.    Dispense:  10 mL    Refill:  0    Order Specific Question:   Supervising Provider    Answer:   Carlota Raspberry, JEFFREY R [[id number]]    Follow-up: Return in about 6 months (around 03/27/2022) for chronic conditions.   PLAN Chronic conditions stable. Labs to check lipids and sugars. Augmentin as above for ear infection. Discussed nonpharm. Can use vosol-hc as well. Return in 6 mo, sooner with concerns Patient encouraged to call clinic with any questions, comments, or concerns.  [name], NP

## 2021-09-27 NOTE — Patient Instructions (Signed)
Mr. Vanvoorhis -   Doristine Devoid to see you  Glad things are fairly stable.  Augmentin twice daily for 10 days for the ear. Call if worsening or failing to improve Ok to use ear drops as instructed as needed  See you in 6 mo  Call sooner if you need anything  Thanks,  Denice Paradise

## 2021-09-28 LAB — LIPID PANEL
Cholesterol: 142 mg/dL (ref <200)
HDL: 54 mg/dL (ref >=40)
LDL Cholesterol (Calc): 71 mg/dL (calc)
Non-HDL Cholesterol (Calc): 88 mg/dL (calc) (ref <130)
Total CHOL/HDL Ratio: 2.6 (calc) (ref <5.0)
Triglycerides: 86 mg/dL (ref <150)

## 2021-09-28 LAB — HEMOGLOBIN A1C
Hgb A1c MFr Bld: 5.7 % of total Hgb — ABNORMAL HIGH (ref <5.7)
Mean Plasma Glucose: 117 mg/dL
eAG (mmol/L): 6.5 mmol/L

## 2021-10-01 ENCOUNTER — Other Ambulatory Visit: Payer: Self-pay

## 2021-10-01 ENCOUNTER — Ambulatory Visit (HOSPITAL_COMMUNITY)
Admission: RE | Admit: 2021-10-01 | Discharge: 2021-10-01 | Disposition: A | Discharge status: Home / Self Care | Payer: No Typology Code available for payment source | Source: Ambulatory Visit | Attending: Adult Health | Admitting: Adult Health

## 2021-10-01 DIAGNOSIS — C3490 Malignant neoplasm of unspecified part of unspecified bronchus or lung: Secondary | ICD-10-CM | POA: Insufficient documentation

## 2021-10-01 MED ORDER — SODIUM CHLORIDE (PF) 0.9 % IJ SOLN
INTRAMUSCULAR | Status: AC
  Filled 2021-10-01: qty 50

## 2021-10-01 MED ORDER — IOHEXOL 300 MG/ML  SOLN
75.0000 mL | Freq: Once | INTRAMUSCULAR | Status: AC | PRN
  Administered 2021-10-01: 15:00:00 75 mL via INTRAVENOUS

## 2021-10-09 ENCOUNTER — Inpatient Hospital Stay (HOSPITAL_BASED_OUTPATIENT_CLINIC_OR_DEPARTMENT_OTHER): Payer: No Typology Code available for payment source | Admitting: Hematology

## 2021-10-09 ENCOUNTER — Inpatient Hospital Stay: Payer: No Typology Code available for payment source | Attending: Hematology

## 2021-10-09 ENCOUNTER — Inpatient Hospital Stay: Payer: No Typology Code available for payment source

## 2021-10-09 ENCOUNTER — Other Ambulatory Visit: Payer: Self-pay

## 2021-10-09 VITALS — BP 117/61 | HR 65 | Temp 97.4°F | Resp 18 | Wt 126.1 lb

## 2021-10-09 DIAGNOSIS — Z5112 Encounter for antineoplastic immunotherapy: Secondary | ICD-10-CM | POA: Insufficient documentation

## 2021-10-09 DIAGNOSIS — J690 Pneumonitis due to inhalation of food and vomit: Secondary | ICD-10-CM | POA: Diagnosis not present

## 2021-10-09 DIAGNOSIS — C3492 Malignant neoplasm of unspecified part of left bronchus or lung: Secondary | ICD-10-CM

## 2021-10-09 DIAGNOSIS — C3490 Malignant neoplasm of unspecified part of unspecified bronchus or lung: Secondary | ICD-10-CM

## 2021-10-09 DIAGNOSIS — C7951 Secondary malignant neoplasm of bone: Secondary | ICD-10-CM | POA: Insufficient documentation

## 2021-10-09 DIAGNOSIS — H66002 Acute suppurative otitis media without spontaneous rupture of ear drum, left ear: Secondary | ICD-10-CM

## 2021-10-09 DIAGNOSIS — Z95828 Presence of other vascular implants and grafts: Secondary | ICD-10-CM

## 2021-10-09 DIAGNOSIS — C3412 Malignant neoplasm of upper lobe, left bronchus or lung: Secondary | ICD-10-CM | POA: Diagnosis present

## 2021-10-09 DIAGNOSIS — Z7189 Other specified counseling: Secondary | ICD-10-CM

## 2021-10-09 LAB — CBC WITH DIFFERENTIAL (CANCER CENTER ONLY)
Abs Immature Granulocytes: 0.01 10*3/uL (ref 0.00–0.07)
Basophils Absolute: 0 10*3/uL (ref 0.0–0.1)
Basophils Relative: 0 %
Eosinophils Absolute: 0.3 10*3/uL (ref 0.0–0.5)
Eosinophils Relative: 3 %
HCT: 34.1 % — ABNORMAL LOW (ref 39.0–52.0)
Hemoglobin: 11.2 g/dL — ABNORMAL LOW (ref 13.0–17.0)
Immature Granulocytes: 0 %
Lymphocytes Relative: 7 %
Lymphs Abs: 0.5 10*3/uL — ABNORMAL LOW (ref 0.7–4.0)
MCH: 31.8 pg (ref 26.0–34.0)
MCHC: 32.8 g/dL (ref 30.0–36.0)
MCV: 96.9 fL (ref 80.0–100.0)
Monocytes Absolute: 0.7 10*3/uL (ref 0.1–1.0)
Monocytes Relative: 9 %
Neutro Abs: 6.2 10*3/uL (ref 1.7–7.7)
Neutrophils Relative %: 81 %
Platelet Count: 221 10*3/uL (ref 150–400)
RBC: 3.52 MIL/uL — ABNORMAL LOW (ref 4.22–5.81)
RDW: 13.9 % (ref 11.5–15.5)
WBC Count: 7.7 10*3/uL (ref 4.0–10.5)
nRBC: 0 % (ref 0.0–0.2)

## 2021-10-09 LAB — CMP (CANCER CENTER ONLY)
ALT: 17 U/L (ref 0–44)
AST: 25 U/L (ref 15–41)
Albumin: 3.7 g/dL (ref 3.5–5.0)
Alkaline Phosphatase: 55 U/L (ref 38–126)
Anion gap: 6 (ref 5–15)
BUN: 19 mg/dL (ref 8–23)
CO2: 28 mmol/L (ref 22–32)
Calcium: 8.9 mg/dL (ref 8.9–10.3)
Chloride: 103 mmol/L (ref 98–111)
Creatinine: 0.79 mg/dL (ref 0.61–1.24)
GFR, Estimated: >60 mL/min (ref >60)
Glucose, Bld: 91 mg/dL (ref 70–99)
Potassium: 4.2 mmol/L (ref 3.5–5.1)
Sodium: 137 mmol/L (ref 135–145)
Total Bilirubin: 0.6 mg/dL (ref 0.3–1.2)
Total Protein: 6.6 g/dL (ref 6.5–8.1)

## 2021-10-09 LAB — TSH: TSH: 3.862 u[IU]/mL (ref 0.320–4.118)

## 2021-10-09 MED ORDER — SODIUM CHLORIDE 0.9% FLUSH
10.0000 mL | INTRAVENOUS | Status: DC | PRN
  Administered 2021-10-09: 10 mL

## 2021-10-09 MED ORDER — DIPHENHYDRAMINE HCL 50 MG/ML IJ SOLN
25.0000 mg | Freq: Once | INTRAMUSCULAR | Status: AC
  Administered 2021-10-09: 25 mg via INTRAVENOUS
  Filled 2021-10-09: qty 1

## 2021-10-09 MED ORDER — FAMOTIDINE IN NACL 20-0.9 MG/50ML-% IV SOLN
20.0000 mg | Freq: Once | INTRAVENOUS | Status: AC
  Administered 2021-10-09: 20 mg via INTRAVENOUS
  Filled 2021-10-09: qty 50

## 2021-10-09 MED ORDER — AMOXICILLIN-POT CLAVULANATE 875-125 MG PO TABS: 1.0000 | ORAL_TABLET | Freq: Two times a day (BID) | ORAL | 0 refills | Status: AC

## 2021-10-09 MED ORDER — SODIUM CHLORIDE 0.9 % IV SOLN
200.0000 mg | Freq: Once | INTRAVENOUS | Status: AC
  Administered 2021-10-09: 200 mg via INTRAVENOUS
  Filled 2021-10-09: qty 200

## 2021-10-09 MED ORDER — SODIUM CHLORIDE 0.9 % IV SOLN: Freq: Once | INTRAVENOUS | Status: AC

## 2021-10-09 MED ORDER — HEPARIN SOD (PORK) LOCK FLUSH 100 UNIT/ML IV SOLN
500.0000 [IU] | Freq: Once | INTRAVENOUS | Status: AC | PRN
  Administered 2021-10-09: 500 [IU]

## 2021-10-09 MED ORDER — SODIUM CHLORIDE 0.9% FLUSH
10.0000 mL | INTRAVENOUS | Status: AC | PRN
  Administered 2021-10-09: 10 mL

## 2021-10-09 NOTE — Patient Instructions (Signed)
[facility]   Discharge Instructions: Thank you for choosing [facility] to provide your oncology and hematology care.   If you have a lab appointment with the [facility], please go directly to the [facility] and check in at the registration area.   Wear comfortable clothing and clothing appropriate for easy access to any Portacath or PICC line.   We strive to give you quality time with your provider. You may need to reschedule your appointment if you arrive late (15 or more minutes).  Arriving late affects you and other patients whose appointments are after yours.  Also, if you miss three or more appointments without notifying the office, you may be dismissed from the clinic at the providers discretion.      For prescription refill requests, have your pharmacy contact our office and allow 72 hours for refills to be completed.    Today you received the following chemotherapy and/or immunotherapy agents: pembrolizumab      To help prevent nausea and vomiting after your treatment, we encourage you to take your nausea medication as directed.  BELOW ARE SYMPTOMS THAT SHOULD BE REPORTED IMMEDIATELY: *FEVER GREATER THAN 100.4 F (38 C) OR HIGHER *CHILLS OR SWEATING *NAUSEA AND VOMITING THAT IS NOT CONTROLLED WITH YOUR NAUSEA MEDICATION *UNUSUAL SHORTNESS OF BREATH *UNUSUAL BRUISING OR BLEEDING *URINARY PROBLEMS (pain or burning when urinating, or frequent urination) *BOWEL PROBLEMS (unusual diarrhea, constipation, pain near the anus) TENDERNESS IN MOUTH AND THROAT WITH OR WITHOUT PRESENCE OF ULCERS (sore throat, sores in mouth, or a toothache) UNUSUAL RASH, SWELLING OR PAIN  UNUSUAL VAGINAL DISCHARGE OR ITCHING   Items with * indicate a potential emergency and should be followed up as soon as possible or go to the Emergency Department if any problems should occur.  Please show the CHEMOTHERAPY ALERT CARD or IMMUNOTHERAPY ALERT CARD at  check-in to the Emergency Department and triage nurse.  Should you have questions after your visit or need to cancel or reschedule your appointment, please contact [facility]  Dept: [phone]  and follow the prompts.  Office hours are 8:00 a.m. to 4:30 p.m. Monday - Friday. Please note that voicemails left after 4:00 p.m. may not be returned until the following business day.  We are closed weekends and major holidays. You have access to a nurse at all times for urgent questions. Please call the main number to the clinic Dept: [phone] and follow the prompts.   For any non-urgent questions, you may also contact your provider using MyChart. We now offer e-Visits for anyone 49 and older to request care online for non-urgent symptoms. For details visit [email or web address].   Also download the MyChart app! Go to the app store, search "MyChart", open the app, select [facility], and log in with your MyChart username and password.  Due to Covid, a mask is required upon entering the hospital/clinic. If you do not have a mask, one will be given to you upon arrival. For doctor visits, patients may have 1 support person aged 41 or older with them. For treatment visits, patients cannot have anyone with them due to current Covid guidelines and our immunocompromised population.

## 2021-10-10 ENCOUNTER — Encounter: Payer: Self-pay | Admitting: Hematology

## 2021-10-10 ENCOUNTER — Telehealth: Payer: Self-pay | Admitting: Hematology

## 2021-10-10 MED ORDER — FENTANYL 50 MCG/HR TD PT72: 1.0000 | MEDICATED_PATCH | TRANSDERMAL | 0 refills | Status: DC

## 2021-10-10 MED ORDER — MORPHINE SULFATE 15 MG PO TABS: 15.0000 mg | ORAL_TABLET | ORAL | 0 refills | Status: DC | PRN

## 2021-10-10 MED ORDER — ONDANSETRON HCL 8 MG PO TABS: 8.0000 mg | ORAL_TABLET | Freq: Three times a day (TID) | ORAL | 2 refills | Status: DC | PRN

## 2021-10-10 NOTE — Telephone Encounter (Signed)
Left message with follow-up appointments per 2/1 los. °

## 2021-10-15 ENCOUNTER — Encounter: Payer: Self-pay | Admitting: Hematology

## 2021-10-15 NOTE — Progress Notes (Addendum)
[facility] Follow up:    Kyle Coss, NP [address] [place]  [id number]  DOS .10/09/2021  CC: Follow-up for continued evaluation and management of lung cancer   DIAGNOSIS: Stage IV lung cancer  SUMMARY OF ONCOLOGIC HISTORY: Oncology History  Metastatic cancer ([place])  10/19/2018 Initial Diagnosis   Metastatic cancer ([place])   11/24/2018 -  Chemotherapy   Patient is on Treatment Plan : LUNG NSCLC Carboplatin + Paclitaxel + Pembrolizumab q21d x 4 cycles / Pembrolizumab Maintenance Q21D     Bone metastases ([place])  10/19/2018 Initial Diagnosis   Bone metastases ([place])   11/24/2018 -  Chemotherapy   The patient had dexamethasone (DECADRON) 4 MG tablet, 1 of 1 cycle, Start date: 11/24/2018, End date: 04/27/2019 palonosetron (ALOXI) injection 0.25 mg, 0.25 mg, Intravenous,  Once, 5 of 5 cycles Administration: 0.25 mg (11/24/2018), 0.25 mg (12/15/2018), 0.25 mg (01/05/2019), 0.25 mg (01/26/2019), 0.25 mg (02/16/2019) pegfilgrastim (NEULASTA ONPRO KIT) injection 6 mg, 6 mg, Subcutaneous, Once, 3 of 3 cycles Administration: 6 mg (01/05/2019), 6 mg (01/26/2019), 6 mg (02/16/2019) pegfilgrastim-cbqv (UDENYCA) injection 6 mg, 6 mg, Subcutaneous, Once, 2 of 2 cycles Administration: 6 mg (11/26/2018), 6 mg (12/17/2018) CARBOplatin (PARAPLATIN) 410 mg in sodium chloride 0.9 % 250 mL chemo infusion, 410 mg (108 % of original dose 381.5 mg), Intravenous,  Once, 5 of 5 cycles Dose modification:   (original dose 381.5 mg, Cycle 1) Administration: 410 mg (11/24/2018), 410 mg (12/15/2018), 410 mg (01/05/2019), 410 mg (01/26/2019), 410 mg (02/16/2019) PACLitaxel (TAXOL) 234 mg in sodium chloride 0.9 % 250 mL chemo infusion (> 97m/m2), 135 mg/m2 = 234 mg (100 % of original dose 135 mg/m2), Intravenous,  Once, 5 of 5 cycles Dose modification: 150 mg/m2 (original dose 135 mg/m2, Cycle 1, Reason: Provider Judgment), 135 mg/m2 (original dose 135 mg/m2, Cycle 1, Reason: Provider Judgment), 150  mg/m2 (original dose 135 mg/m2, Cycle 2, Reason: Catheter Related Infection) Administration: 234 mg (11/24/2018), 258 mg (12/15/2018), 258 mg (01/05/2019), 258 mg (01/26/2019), 258 mg (02/16/2019) pembrolizumab (KEYTRUDA) 200 mg in sodium chloride 0.9 % 50 mL chemo infusion, 200 mg, Intravenous, Once, 27 of 29 cycles Administration: 200 mg (11/24/2018), 200 mg (12/15/2018), 200 mg (01/05/2019), 200 mg (01/26/2019), 200 mg (02/16/2019), 200 mg (03/09/2019), 200 mg (03/30/2019), 200 mg (04/20/2019), 200 mg (05/11/2019), 200 mg (06/01/2019), 200 mg (06/22/2019), 200 mg (07/13/2019), 200 mg (08/03/2019), 200 mg (09/14/2019), 200 mg (08/24/2019), 200 mg (10/05/2019), 200 mg (10/26/2019), 200 mg (11/16/2019), 200 mg (12/07/2019), 200 mg (12/28/2019), 200 mg (01/18/2020), 200 mg (02/08/2020), 200 mg (02/29/2020), 200 mg (03/21/2020), 200 mg (04/11/2020), 200 mg (05/02/2020), 200 mg (05/24/2020) fosaprepitant (EMEND) 150 mg, dexamethasone (DECADRON) 12 mg in sodium chloride 0.9 % 145 mL IVPB, , Intravenous,  Once, 5 of 5 cycles Administration:  (11/24/2018),  (12/15/2018),  (01/05/2019),  (01/26/2019),  (02/16/2019)  for chemotherapy treatment.    Squamous cell lung cancer, left ([place]  11/24/2018 Initial Diagnosis   Squamous cell lung cancer, left ([facility]   11/24/2018 -  Chemotherapy   The patient had dexamethasone (DECADRON) 4 MG tablet, 1 of 1 cycle, Start date: 11/24/2018, End date: 04/27/2019 palonosetron (ALOXI) injection 0.25 mg, 0.25 mg, Intravenous,  Once, 5 of 5 cycles Administration: 0.25 mg (11/24/2018), 0.25 mg (12/15/2018), 0.25 mg (01/05/2019), 0.25 mg (01/26/2019), 0.25 mg (02/16/2019) pegfilgrastim (NEULASTA ONPRO KIT) injection 6 mg, 6 mg, Subcutaneous, Once, 3 of 3 cycles Administration: 6 mg (01/05/2019), 6 mg (01/26/2019), 6 mg (02/16/2019) pegfilgrastim-cbqv (UDENYCA) injection 6 mg,  6 mg, Subcutaneous, Once, 2 of 2 cycles Administration: 6 mg (11/26/2018), 6 mg (12/17/2018) CARBOplatin (PARAPLATIN) 410 mg in sodium chloride 0.9 % 250 mL  chemo infusion, 410 mg (108 % of original dose 381.5 mg), Intravenous,  Once, 5 of 5 cycles Dose modification:   (original dose 381.5 mg, Cycle 1) Administration: 410 mg (11/24/2018), 410 mg (12/15/2018), 410 mg (01/05/2019), 410 mg (01/26/2019), 410 mg (02/16/2019) PACLitaxel (TAXOL) 234 mg in sodium chloride 0.9 % 250 mL chemo infusion (> 43m/m2), 135 mg/m2 = 234 mg (100 % of original dose 135 mg/m2), Intravenous,  Once, 5 of 5 cycles Dose modification: 150 mg/m2 (original dose 135 mg/m2, Cycle 1, Reason: Provider Judgment), 135 mg/m2 (original dose 135 mg/m2, Cycle 1, Reason: Provider Judgment), 150 mg/m2 (original dose 135 mg/m2, Cycle 2, Reason: Catheter Related Infection) Administration: 234 mg (11/24/2018), 258 mg (12/15/2018), 258 mg (01/05/2019), 258 mg (01/26/2019), 258 mg (02/16/2019) pembrolizumab (KEYTRUDA) 200 mg in sodium chloride 0.9 % 50 mL chemo infusion, 200 mg, Intravenous, Once, 27 of 29 cycles Administration: 200 mg (11/24/2018), 200 mg (12/15/2018), 200 mg (01/05/2019), 200 mg (01/26/2019), 200 mg (02/16/2019), 200 mg (03/09/2019), 200 mg (03/30/2019), 200 mg (04/20/2019), 200 mg (05/11/2019), 200 mg (06/01/2019), 200 mg (06/22/2019), 200 mg (07/13/2019), 200 mg (08/03/2019), 200 mg (09/14/2019), 200 mg (08/24/2019), 200 mg (10/05/2019), 200 mg (10/26/2019), 200 mg (11/16/2019), 200 mg (12/07/2019), 200 mg (12/28/2019), 200 mg (01/18/2020), 200 mg (02/08/2020), 200 mg (02/29/2020), 200 mg (03/21/2020), 200 mg (04/11/2020), 200 mg (05/02/2020), 200 mg (05/24/2020) fosaprepitant (EMEND) 150 mg, dexamethasone (DECADRON) 12 mg in sodium chloride 0.9 % 145 mL IVPB, , Intravenous,  Once, 5 of 5 cycles Administration:  (11/24/2018),  (12/15/2018),  (01/05/2019),  (01/26/2019),  (02/16/2019)  for chemotherapy treatment.      CURRENT THERAPY: Keytruda  INTERVAL HISTORY:  Kyle SCHEAR76y.o. male is here for continued evaluation and management of his metastatic lung cancer and for his next cycle of maintenance Keytruda.  He  notes no acute new symptoms since his last clinic visit. He had a CT chest/2020 shows possible aspiration pneumonia in the right lower lung will need follow-up to evaluate for resolution.  Patient notes no overt swallowing issues or cough with eating. Agreeable with using Augmentin prescription sent to pharmacy. Discussed getting swallow evaluation to evaluate for aspiration risk.  No fevers no chills no night sweats no change in breathing.  No reported new toxicities from his immunotherapy.   Patient Active Problem List   Diagnosis Date Noted   Persistent cough for 3 weeks or longer 07/08/2021   Left ear pain 07/08/2021   Recurrent productive cough 06/28/2021   Left lower lobe pneumonia 02/20/2020   Pulmonary infarct ([facility] 02/19/2020   Aspiration pneumonia of left lower lobe due to gastric secretions ([facility] 02/19/2020   Squamous cell carcinoma of lung, stage IV ([facility] 02/19/2020   GERD without esophagitis 02/19/2020   COPD with chronic bronchitis and emphysema ([place] 05/02/2019   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   H/O agent Orange exposure 03/22/2019   Centrilobular emphysema ([facility] 03/21/2019   Squamous cell lung cancer, left ([facility] 11/24/2018   Counseling regarding advance care planning and goals of care 11/24/2018   Metastatic cancer ([facility]    Malignant neoplasm of upper lobe of left lung ([facility]    Bone metastases ([facility]    Severe protein-calorie malnutrition ([facility] 10/15/2018   Palliative care by specialist    Goals of care, counseling/discussion    Cancer associated pain 10/14/2018  CAD S/P percutaneous coronary angioplasty 07/13/2013   Essential hypertension 07/13/2013   Mixed hyperlipidemia 07/13/2013   Tobacco abuse 07/13/2013   Coronary artery disease 07/13/2013   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 immunotherapy    SURGICAL HISTORY: Past Surgical History:  Procedure Laterality Date   CATARACT EXTRACTION W/ INTRAOCULAR LENS IMPLANT Right 12/2007   CORONARY ANGIOPLASTY WITH STENT PLACEMENT  06/2008   "3 stents" (06/14/2018)   CYSTOSCOPY W/ RETROGRADES  09/22/2011   Procedure: CYSTOSCOPY WITH RETROGRADE PYELOGRAM;  Surgeon: Bernestine Amass, MD;  Location: [facility];  Service: Urology;  Laterality: Left;  Cystoscopy left Retrograde Pyelogram      (c-arm)    CYSTOSCOPY WITH BIOPSY  09/22/2011   Procedure: CYSTOSCOPY WITH BIOPSY;  Surgeon: Bernestine Amass, MD;  Location: [facility];  Service: Urology;  Laterality: N/A;   Biopsy   CYSTOSCOPY WITH BIOPSY N/A 04/19/2021   Procedure: CYSTOSCOPY WITH BLADDER  BIOPSY WITH FULGERATION;  Surgeon: [name], MD;  Location: [facility];  Service: Urology;  Laterality: N/A;   EXCISIONAL HEMORRHOIDECTOMY  ~ 2006   EYE SURGERY Left 04/2011   "reconstruction; gold weight in eye lid " (06/14/2018)   FOOT NEUROMA SURGERY Left    immunotherapy     INGUINAL HERNIA REPAIR Right 02/2018   IR IMAGING GUIDED PORT INSERTION  11/23/2018   NM MYOCAR PERF WALL MOTION  07/11/2008   MILD ISCHEMIA IN THE BASL INFERIOR, MID INFERIOR & APICAL INFERIOR REGIONS   SALIVARY GLAND SURGERY Left 04/2011   "squamous cell cancer attached to it; took the gland out"    SKIN CANCER EXCISION     "arms, hands, face, neck" (06/14/2018)   TRANSURETHRAL RESECTION OF BLADDER TUMOR WITH GYRUS (TURBT-GYRUS)  2010    SOCIAL HISTORY: Social History   Socioeconomic History   Marital status: Married    Spouse name: Not on file   Number of children: Not on file   Years of education: Not on file   Highest education level: Not on file  Occupational History   Not on file  Tobacco Use   Smoking status: Every Day    Packs/day: 0.50    Years: 52.00    Pack years: 26.00    Types: Cigarettes    Start date: 1968   Smokeless tobacco: Never   Tobacco comments:    03/21/19- smoking 10 cigs a day   Vaping Use   Vaping Use: Never used  Substance and Sexual Activity   Alcohol use: Never   Drug use: Never   Sexual activity: Not on file  Other Topics Concern   Not  on file  Social History Narrative   Not on file   Social Determinants of Health   Financial Resource Strain: Not on file  Food Insecurity: Not on file  Transportation Needs: Not on file  Physical Activity: Not on file  Stress: Not on file  Social Connections: Not on file  Intimate Partner Violence: Not on file    FAMILY HISTORY: Family History  Problem Relation Age of Onset   Heart attack Mother 74   Cancer Mother        Lung   Diabetes Mother    Heart disease Mother    Hypertension Mother    Stroke Father 37   Cancer Father        Lung   Brain cancer Brother    Cancer Sister        Melanoma of great Toe   Hyperlipidemia Sister      10 Point review of Systems was done is negative except as noted above.  PHYSICAL EXAMINATION  ECOG PERFORMANCE STATUS: 1 - Symptomatic but completely ambulatory  Vitals:   10/09/21 1025  BP: 117/61  Pulse: 65  Resp: 18  Temp: (!) 97.4 F (36.3 C)  SpO2: 97%  NAD GENERAL:alert, in no acute distress and comfortable SKIN: no acute rashes, no significant lesions EYES: conjunctiva are pink and non-injected, sclera anicteric OROPHARYNX: MMM, no  exudates, no oropharyngeal erythema or ulceration NECK: supple, no JVD LYMPH:  no palpable lymphadenopathy in the cervical, axillary or inguinal regions LUNGS: clear to auscultation b/l with normal respiratory effort HEART: regular rate & rhythm ABDOMEN:  normoactive bowel sounds , non tender, not distended. Extremity: no pedal edema PSYCH: alert & oriented x 3 with fluent speech NEURO: no focal motor/sensory deficits   LABORATORY DATA: . CBC Latest Ref Rng & Units 10/09/2021 09/18/2021 08/28/2021  WBC 4.0 - 10.5 K/uL 7.7 8.6 12.1(H)  Hemoglobin 13.0 - 17.0 g/dL 11.2(L) 11.2(L) 11.3(L)  Hematocrit 39.0 - 52.0 % 34.1(L) 34.0(L) 34.2(L)  Platelets 150 - 400 K/uL 221 183 252   CMP Latest Ref Rng & Units 10/09/2021 09/18/2021 08/28/2021  Glucose 70 - 99 mg/dL 91 83 97  BUN 8 - 23 mg/dL 19 21 19   Creatinine 0.61 - 1.24 mg/dL 0.79 0.82 0.84  Sodium 135 - 145 mmol/L 137 138 138  Potassium 3.5 - 5.1 mmol/L 4.2 4.6 4.4  Chloride 98 - 111 mmol/L 103 106 103  CO2 22 - 32 mmol/L 28 27 29   Calcium 8.9 - 10.3 mg/dL 8.9 8.8(L) 8.8(L)  Total Protein 6.5 - 8.1 g/dL 6.6 6.5 6.5  Total Bilirubin 0.3 - 1.2 mg/dL 0.6 0.4 0.4  Alkaline Phos 38 - 126 U/L 55 58 56  AST 15 - 41 U/L 25 20 23   ALT 0 - 44 U/L 17 16 26     CT Chest W Contrast  Result Date: 10/02/2021 CLINICAL DATA:  A 76 year old male presents for evaluation of non-small cell lung cancer, follow-up evaluation. EXAM: CT CHEST WITH CONTRAST TECHNIQUE: Multidetector CT imaging of the chest was performed during intravenous contrast administration. RADIATION DOSE REDUCTION: This exam was performed according to the departmental dose-optimization program which includes automated exposure control, adjustment of the mA and/or kV according to patient size and/or use of iterative reconstruction technique. CONTRAST:  92m OMNIPAQUE IOHEXOL 300 MG/ML  SOLN COMPARISON:  Comparison is made with April 23, 2021. FINDINGS: Cardiovascular: Calcified atheromatous  plaque in the thoracic aorta without aneurysmal dilation. Three-vessel coronary artery disease. Normal heart size without pericardial effusion or  sign of pericardial nodularity. Central pulmonary vasculature is unremarkable aside from distortion of the LEFT hilum following post treatment changes about the LEFT hilum and upper lobe as well as superior segment of LEFT lower lobe, see below. RIGHT-sided Port-A-Cath terminates in the upper RIGHT atrium. Mediastinum/Nodes: No thoracic inlet, axillary, mediastinal or hilar adenopathy. Esophagus grossly normal. Lungs/Pleura: Scarring about the LEFT hilum. Bronchial wall thickening and distortion of hilar bronchi and vasculature without substantial change from prior imaging. Air-filled cavity in the posterior pleural space in the LEFT upper chest measures 3.6 x 2.0 cm and is unchanged. 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 Adjacent vertebral destruction is noted and this is also similar to prior imaging, see below. Inspissated secretions are noted within lower lobe bronchi on the LEFT. No frank areas of consolidation. Paraseptal and centrilobular emphysema as before. New patchy opacities in the RIGHT lung base when compared to previous imaging. (Image 137/7) small nodular focus 5 mm in the RIGHT lower lobe. Consolidative appearing process in the RIGHT lung base new when compared to previous imaging, nodular appearing change is noted with maximal Q dimension of 2.1 cm, contiguous with bandlike process comparison with prior chest x-rays shows this is a new finding, most recent radiograph for comparison is October of 2022. Other small areas of nodularity are noted. Material is present in RIGHT lower lobe bronchi as well. Stable pleural and parenchymal scarring at the lung apices. Upper Abdomen: Small hypervascular focus likely benign hemangioma or shunt lesion is stable dating back to May of 2021 in the dome of the RIGHT hemi  liver. No acute upper abdominal process. Musculoskeletal: No new area of bony destruction or acute bone finding. Upper thoracic vertebral destruction is similar to prior imaging adjacent to air containing cavity and soft tissue along the LEFT paraspinous region in the LEFT pleural space this involves the T5 and T6 vertebral levels most notably involving vertebral body, pedicles facets on the LEFT with pathologic compression fracture of T6 showing no change. IMPRESSION: 1. Post treatment changes in the LEFT chest with stable air containing cavity in the posterior LEFT pleural space. This potentially represents a loculated bronchopleural fistula. 2. T5 and T6 destructive changes with pathologic fracture at T6 with similar appearance, adjacent soft tissue is unchanged. 3. New nodularity at the RIGHT lung base with secretions in lower lobe bronchi. More nodular appearing area in the medial RIGHT lung base, findings suggest sequela of infection perhaps aspiration related. Given the more nodular appearance of medial RIGHT lower lobe process would suggest 6-8 week follow-up to ensure improvement/resolution. The possibility of neoplasm in this location is difficult to exclude, other scattered nodules have an appearance that is more suggestive of post infectious changes. 4. Aortic atherosclerosis, coronary artery disease and pulmonary emphysema as before. Aortic Atherosclerosis (ICD10-I70.0) and Emphysema (ICD10-J43.9). Electronically Signed   By: Zetta Bills M.D.   On: 10/02/2021 15:32  =  ASSESSMENT and THERAPY PLAN:   76 yo male    1. 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 Bone metastases - T5,T6 and T8, on Xgeva every 6 weeks 3. Smoker 4. 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  Has regularly followed up with ENT Dr. Fenton Malling 6. Cancer related pain, on high dose narcotics (fentanyl patch 50 mcg/h, morphine 15 mg 3 times a day as needed)   Plan -Lab results from today were reviewed CBC stable hemoglobin 11.2 CMP unremarkable TSH within normal limits at 3.86  CT chest from 10/01/2021 reviewed in detail.  Noted to have some new nodularity in the right lung base that appears to be related to possible aspiration or retained secretions -We will treat with Augmentin for possible aspiration pneumonia -Modified barium swallow and referral to swallow evaluation team to evaluate for aspiration risk -No notable toxicities from [facility] -We will continue maintenance pembrolizumab at this time. -Continue to hold Xgeva until dental clearance is obtained   Follow-up Please schedule next 3 cycles of maintenance pembrolizumab with labs CT chest in 5 weeks to follow-up on her right lower lobe lesions Referral to speech therapy for swallow evaluation and consideration of MBS MD visit in 6 weeks

## 2021-10-25 DIAGNOSIS — H60312 Diffuse otitis externa, left ear: Secondary | ICD-10-CM | POA: Insufficient documentation

## 2021-10-27 NOTE — Addendum Note (Signed)
Addended by: Sullivan Lone on: 10/27/2021 07:42 PM   Modules accepted: Orders

## 2021-10-28 ENCOUNTER — Telehealth (HOSPITAL_COMMUNITY): Payer: Self-pay

## 2021-10-28 DIAGNOSIS — L570 Actinic keratosis: Secondary | ICD-10-CM | POA: Diagnosis not present

## 2021-10-28 DIAGNOSIS — L814 Other melanin hyperpigmentation: Secondary | ICD-10-CM | POA: Diagnosis not present

## 2021-10-28 DIAGNOSIS — L821 Other seborrheic keratosis: Secondary | ICD-10-CM | POA: Diagnosis not present

## 2021-10-28 DIAGNOSIS — C44699 Other specified malignant neoplasm of skin of left upper limb, including shoulder: Secondary | ICD-10-CM | POA: Diagnosis not present

## 2021-10-28 DIAGNOSIS — D692 Other nonthrombocytopenic purpura: Secondary | ICD-10-CM | POA: Diagnosis not present

## 2021-10-28 DIAGNOSIS — Z85828 Personal history of other malignant neoplasm of skin: Secondary | ICD-10-CM | POA: Diagnosis not present

## 2021-10-28 DIAGNOSIS — D1801 Hemangioma of skin and subcutaneous tissue: Secondary | ICD-10-CM | POA: Diagnosis not present

## 2021-10-28 DIAGNOSIS — C44629 Squamous cell carcinoma of skin of left upper limb, including shoulder: Secondary | ICD-10-CM | POA: Diagnosis not present

## 2021-10-28 DIAGNOSIS — L720 Epidermal cyst: Secondary | ICD-10-CM | POA: Diagnosis not present

## 2021-10-28 DIAGNOSIS — C44229 Squamous cell carcinoma of skin of left ear and external auricular canal: Secondary | ICD-10-CM | POA: Diagnosis not present

## 2021-10-28 DIAGNOSIS — D485 Neoplasm of uncertain behavior of skin: Secondary | ICD-10-CM | POA: Diagnosis not present

## 2021-10-28 NOTE — Telephone Encounter (Signed)
Attempted to contact patient to schedule OP MBS - left voicemail. ?

## 2021-10-29 ENCOUNTER — Other Ambulatory Visit (HOSPITAL_COMMUNITY): Payer: Self-pay

## 2021-10-29 DIAGNOSIS — R1310 Dysphagia, unspecified: Secondary | ICD-10-CM

## 2021-10-30 ENCOUNTER — Inpatient Hospital Stay: Payer: No Typology Code available for payment source

## 2021-10-30 ENCOUNTER — Other Ambulatory Visit: Payer: Self-pay

## 2021-10-30 DIAGNOSIS — C7951 Secondary malignant neoplasm of bone: Secondary | ICD-10-CM

## 2021-10-30 DIAGNOSIS — C3490 Malignant neoplasm of unspecified part of unspecified bronchus or lung: Secondary | ICD-10-CM

## 2021-10-30 DIAGNOSIS — Z5112 Encounter for antineoplastic immunotherapy: Secondary | ICD-10-CM | POA: Diagnosis not present

## 2021-10-30 DIAGNOSIS — C3492 Malignant neoplasm of unspecified part of left bronchus or lung: Secondary | ICD-10-CM

## 2021-10-30 DIAGNOSIS — Z7189 Other specified counseling: Secondary | ICD-10-CM

## 2021-10-30 LAB — CBC WITH DIFFERENTIAL (CANCER CENTER ONLY)
Abs Immature Granulocytes: 0.03 10*3/uL (ref 0.00–0.07)
Basophils Absolute: 0 10*3/uL (ref 0.0–0.1)
Basophils Relative: 0 %
Eosinophils Absolute: 0.3 10*3/uL (ref 0.0–0.5)
Eosinophils Relative: 3 %
HCT: 32.7 % — ABNORMAL LOW (ref 39.0–52.0)
Hemoglobin: 10.8 g/dL — ABNORMAL LOW (ref 13.0–17.0)
Immature Granulocytes: 0 %
Lymphocytes Relative: 8 %
Lymphs Abs: 0.7 10*3/uL (ref 0.7–4.0)
MCH: 31.9 pg (ref 26.0–34.0)
MCHC: 33 g/dL (ref 30.0–36.0)
MCV: 96.5 fL (ref 80.0–100.0)
Monocytes Absolute: 0.7 10*3/uL (ref 0.1–1.0)
Monocytes Relative: 8 %
Neutro Abs: 7.2 10*3/uL (ref 1.7–7.7)
Neutrophils Relative %: 81 %
Platelet Count: 213 10*3/uL (ref 150–400)
RBC: 3.39 MIL/uL — ABNORMAL LOW (ref 4.22–5.81)
RDW: 14.2 % (ref 11.5–15.5)
WBC Count: 9 10*3/uL (ref 4.0–10.5)
nRBC: 0 % (ref 0.0–0.2)

## 2021-10-30 LAB — CMP (CANCER CENTER ONLY)
ALT: 22 U/L (ref 0–44)
AST: 25 U/L (ref 15–41)
Albumin: 3.5 g/dL (ref 3.5–5.0)
Alkaline Phosphatase: 62 U/L (ref 38–126)
Anion gap: 7 (ref 5–15)
BUN: 24 mg/dL — ABNORMAL HIGH (ref 8–23)
CO2: 28 mmol/L (ref 22–32)
Calcium: 8.9 mg/dL (ref 8.9–10.3)
Chloride: 103 mmol/L (ref 98–111)
Creatinine: 0.85 mg/dL (ref 0.61–1.24)
GFR, Estimated: >60 mL/min (ref >60)
Glucose, Bld: 97 mg/dL (ref 70–99)
Potassium: 4.1 mmol/L (ref 3.5–5.1)
Sodium: 138 mmol/L (ref 135–145)
Total Bilirubin: 0.3 mg/dL (ref 0.3–1.2)
Total Protein: 7.1 g/dL (ref 6.5–8.1)

## 2021-10-30 MED ORDER — SODIUM CHLORIDE 0.9 % IV SOLN: Freq: Once | INTRAVENOUS | Status: AC

## 2021-10-30 MED ORDER — SODIUM CHLORIDE 0.9% FLUSH
10.0000 mL | INTRAVENOUS | Status: DC | PRN
  Administered 2021-10-30: 10 mL

## 2021-10-30 MED ORDER — SODIUM CHLORIDE 0.9% FLUSH: 10.0000 mL | INTRAVENOUS | Status: DC | PRN

## 2021-10-30 MED ORDER — DIPHENHYDRAMINE HCL 50 MG/ML IJ SOLN
25.0000 mg | Freq: Once | INTRAMUSCULAR | Status: AC
  Administered 2021-10-30: 25 mg via INTRAVENOUS
  Filled 2021-10-30: qty 1

## 2021-10-30 MED ORDER — HEPARIN SOD (PORK) LOCK FLUSH 100 UNIT/ML IV SOLN
500.0000 [IU] | Freq: Once | INTRAVENOUS | Status: AC | PRN
  Administered 2021-10-30: 500 [IU]

## 2021-10-30 MED ORDER — FAMOTIDINE IN NACL 20-0.9 MG/50ML-% IV SOLN
20.0000 mg | Freq: Once | INTRAVENOUS | Status: AC
  Administered 2021-10-30: 20 mg via INTRAVENOUS
  Filled 2021-10-30: qty 50

## 2021-10-30 MED ORDER — SODIUM CHLORIDE 0.9 % IV SOLN
200.0000 mg | Freq: Once | INTRAVENOUS | Status: AC
  Administered 2021-10-30: 200 mg via INTRAVENOUS
  Filled 2021-10-30: qty 200

## 2021-10-30 NOTE — Patient Instructions (Signed)
[facility] MEDICAL [facility]COL[facility]  Discharge Instructions: ?Thank you for choosing [facility] to provide your oncology and hematology care.  ? ?If you have a lab appointment with the Cancer Center, please go directly to the Cancer Center and check in at the registration area. ?  ?Wear comfortable clothing and clothing appropriate for easy access to any Portacath or PICC line.  ? ?We strive to give you quality time with your provider. You may need to reschedule your appointment if you arrive late (15 or more minutes).  Arriving late affects you and other patients whose appointments are after yours.  Also, if you miss three or more appointments without notifying the office, you may be dismissed from the clinic at the provider?s discretion.    ?  ?For prescription refill requests, have your pharmacy contact our office and allow 72 hours for refills to be completed.   ? ?Today you received the following chemotherapy and/or immunotherapy agents: Keytruda ?  ?To help prevent nausea and vomiting after your treatment, we encourage you to take your nausea medication as directed. ? ?BELOW ARE SYMPTOMS THAT SHOULD BE REPORTED IMMEDIATELY: ?*FEVER GREATER THAN 100.4 F (38 ?C) OR HIGHER ?*CHILLS OR SWEATING ?*NAUSEA AND VOMITING THAT IS NOT CONTROLLED WITH YOUR NAUSEA MEDICATION ?*UNUSUAL SHORTNESS OF BREATH ?*UNUSUAL BRUISING OR BLEEDING ?*URINARY PROBLEMS (pain or burning when urinating, or frequent urination) ?*BOWEL PROBLEMS (unusual diarrhea, constipation, pain near the anus) ?TENDERNESS IN MOUTH AND THROAT WITH OR WITHOUT PRESENCE OF ULCERS (sore throat, sores in mouth, or a toothache) ?UNUSUAL RASH, SWELLING OR PAIN  ?UNUSUAL VAGINAL DISCHARGE OR ITCHING  ? ?Items with * indicate a potential emergency and should be followed up as soon as possible or go to the Emergency Department if any problems should occur. ? ?Please show the CHEMOTHERAPY ALERT CARD or IMMUNOTHERAPY ALERT CARD at check-in to the  Emergency Department and triage nurse. ? ?Should you have questions after your visit or need to cancel or reschedule your appointment, please contact [facility]  Dept: [phone]  and follow the prompts.  Office hours are 8:00 a.m. to 4:30 p.m. Monday - Friday. Please note that voicemails left after 4:00 p.m. may not be returned until the following business day.  We are closed weekends and major holidays. You have access to a nurse at all times for urgent questions. Please call the main number to the clinic Dept: [phone] and follow the prompts. ? ? ?For any non-urgent questions, you may also contact your provider using MyChart. We now offer e-Visits for anyone 18 and older to request care online for non-urgent symptoms. For details visit [email or web address]. ?  ?Also download the MyChart app! Go to the app store, search "MyChart", open the app, select , and log in with your MyChart username and password. ? ?Due to Covid, a mask is required upon entering the hospital/clinic. If you do not have a mask, one will be given to you upon arrival. For doctor visits, patients may have 1 support person aged 18 or older with them. For treatment visits, patients cannot have anyone with them due to current Covid guidelines and our immunocompromised population.  ? ?

## 2021-10-31 DIAGNOSIS — H60312 Diffuse otitis externa, left ear: Secondary | ICD-10-CM | POA: Diagnosis not present

## 2021-10-31 DIAGNOSIS — C07 Malignant neoplasm of parotid gland: Secondary | ICD-10-CM | POA: Diagnosis not present

## 2021-10-31 LAB — TSH: TSH: 1.091 u[IU]/mL (ref 0.320–4.118)

## 2021-11-07 ENCOUNTER — Other Ambulatory Visit: Payer: Self-pay | Admitting: Registered Nurse

## 2021-11-07 DIAGNOSIS — J449 Chronic obstructive pulmonary disease, unspecified: Secondary | ICD-10-CM

## 2021-11-14 ENCOUNTER — Other Ambulatory Visit: Payer: Self-pay

## 2021-11-14 ENCOUNTER — Ambulatory Visit (HOSPITAL_COMMUNITY)
Admission: RE | Admit: 2021-11-14 | Discharge: 2021-11-14 | Disposition: A | Discharge status: Home / Self Care | Payer: No Typology Code available for payment source | Source: Ambulatory Visit | Attending: Hematology | Admitting: Hematology

## 2021-11-14 DIAGNOSIS — C3492 Malignant neoplasm of unspecified part of left bronchus or lung: Secondary | ICD-10-CM

## 2021-11-14 DIAGNOSIS — J690 Pneumonitis due to inhalation of food and vomit: Secondary | ICD-10-CM

## 2021-11-14 DIAGNOSIS — R1310 Dysphagia, unspecified: Secondary | ICD-10-CM | POA: Diagnosis present

## 2021-11-14 NOTE — Progress Notes (Signed)
Modified Barium Swallow Progress Note ? ?Patient Details  ?Name: NACHMEN MANSEL ?MRN: [id number] ?Date of Birth: Dec 20, 1945 ? ?Today's Date: 11/14/2021 ? ?Modified Barium Swallow completed.  Full report located under Chart Review in the Imaging Section. ? ?Brief recommendations include the following: ? ?Clinical Impression ? 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 He has been having some more recent difficulty biting and chewing since his teeth were pulled ~5 months prior, but he compensates for this as well with Mod I by breaking food into smaller pieces and taking extra time to manipulate them orally until they are fully prepared to swallow. At home he makes careful selections about what he will eat while he is working with a dentist to try to get dentures made. Pharyngeally he has good strength, timing, and efficiency with no aspiration that occurs. Recommend continuing up to regular solids and thin liquids as tolerated. ?  ?Swallow Evaluation Recommendations ? ?   ? ? SLP Diet Recommendations: Regular solids;Thin liquid ? ? Liquid Administration via: Cup;Straw ? ? Medication Administration: Whole meds with liquid ? ? Supervision: Patient able to self feed ? ? Compensations: Slow rate;Small sips/bites ? ? Postural Changes: Seated upright at 90 degrees;Remain semi-upright after after feeds/meals (Comment) ? ? Oral Care Recommendations: Oral care BID ? ?   ? ? ? ?Osie Bond., M.A. CCC-SLP ?Acute Rehabilitation Services ?Pager [phone] ?Office [phone] ? ?11/14/2021,2:00 PM ?

## 2021-11-15 ENCOUNTER — Ambulatory Visit (HOSPITAL_COMMUNITY)
Admission: RE | Admit: 2021-11-15 | Discharge: 2021-11-15 | Disposition: A | Discharge status: Home / Self Care | Payer: No Typology Code available for payment source | Source: Ambulatory Visit | Attending: Hematology | Admitting: Hematology

## 2021-11-15 DIAGNOSIS — C3492 Malignant neoplasm of unspecified part of left bronchus or lung: Secondary | ICD-10-CM | POA: Diagnosis not present

## 2021-11-15 DIAGNOSIS — G510 Bell's palsy: Secondary | ICD-10-CM | POA: Diagnosis not present

## 2021-11-15 DIAGNOSIS — C07 Malignant neoplasm of parotid gland: Secondary | ICD-10-CM | POA: Diagnosis not present

## 2021-11-15 DIAGNOSIS — H60312 Diffuse otitis externa, left ear: Secondary | ICD-10-CM | POA: Diagnosis not present

## 2021-11-15 MED ORDER — SODIUM CHLORIDE (PF) 0.9 % IJ SOLN
INTRAMUSCULAR | Status: AC
  Filled 2021-11-15: qty 50

## 2021-11-20 ENCOUNTER — Inpatient Hospital Stay (HOSPITAL_BASED_OUTPATIENT_CLINIC_OR_DEPARTMENT_OTHER): Payer: No Typology Code available for payment source | Admitting: Hematology

## 2021-11-20 ENCOUNTER — Inpatient Hospital Stay: Payer: No Typology Code available for payment source | Attending: Hematology

## 2021-11-20 ENCOUNTER — Inpatient Hospital Stay: Payer: No Typology Code available for payment source

## 2021-11-20 ENCOUNTER — Other Ambulatory Visit: Payer: Self-pay

## 2021-11-20 DIAGNOSIS — C3412 Malignant neoplasm of upper lobe, left bronchus or lung: Secondary | ICD-10-CM | POA: Diagnosis present

## 2021-11-20 DIAGNOSIS — Z5112 Encounter for antineoplastic immunotherapy: Secondary | ICD-10-CM | POA: Diagnosis present

## 2021-11-20 DIAGNOSIS — F17210 Nicotine dependence, cigarettes, uncomplicated: Secondary | ICD-10-CM | POA: Diagnosis not present

## 2021-11-20 DIAGNOSIS — Z79899 Other long term (current) drug therapy: Secondary | ICD-10-CM | POA: Diagnosis not present

## 2021-11-20 DIAGNOSIS — Z8551 Personal history of malignant neoplasm of bladder: Secondary | ICD-10-CM | POA: Diagnosis not present

## 2021-11-20 DIAGNOSIS — Z95828 Presence of other vascular implants and grafts: Secondary | ICD-10-CM

## 2021-11-20 DIAGNOSIS — C3492 Malignant neoplasm of unspecified part of left bronchus or lung: Secondary | ICD-10-CM

## 2021-11-20 DIAGNOSIS — C3490 Malignant neoplasm of unspecified part of unspecified bronchus or lung: Secondary | ICD-10-CM

## 2021-11-20 DIAGNOSIS — C7951 Secondary malignant neoplasm of bone: Secondary | ICD-10-CM | POA: Diagnosis present

## 2021-11-20 DIAGNOSIS — Z7189 Other specified counseling: Secondary | ICD-10-CM

## 2021-11-20 LAB — CBC WITH DIFFERENTIAL (CANCER CENTER ONLY)
Abs Immature Granulocytes: 0.03 10*3/uL (ref 0.00–0.07)
Basophils Absolute: 0 10*3/uL (ref 0.0–0.1)
Basophils Relative: 0 %
Eosinophils Absolute: 0.2 10*3/uL (ref 0.0–0.5)
Eosinophils Relative: 2 %
HCT: 33.7 % — ABNORMAL LOW (ref 39.0–52.0)
Hemoglobin: 11.2 g/dL — ABNORMAL LOW (ref 13.0–17.0)
Immature Granulocytes: 0 %
Lymphocytes Relative: 6 %
Lymphs Abs: 0.5 10*3/uL — ABNORMAL LOW (ref 0.7–4.0)
MCH: 32 pg (ref 26.0–34.0)
MCHC: 33.2 g/dL (ref 30.0–36.0)
MCV: 96.3 fL (ref 80.0–100.0)
Monocytes Absolute: 0.6 10*3/uL (ref 0.1–1.0)
Monocytes Relative: 7 %
Neutro Abs: 7.2 10*3/uL (ref 1.7–7.7)
Neutrophils Relative %: 85 %
Platelet Count: 182 10*3/uL (ref 150–400)
RBC: 3.5 MIL/uL — ABNORMAL LOW (ref 4.22–5.81)
RDW: 14.2 % (ref 11.5–15.5)
WBC Count: 8.5 10*3/uL (ref 4.0–10.5)
nRBC: 0 % (ref 0.0–0.2)

## 2021-11-20 LAB — TSH: TSH: 1.073 u[IU]/mL (ref 0.320–4.118)

## 2021-11-20 LAB — CMP (CANCER CENTER ONLY)
ALT: 20 U/L (ref 0–44)
AST: 23 U/L (ref 15–41)
Albumin: 3.7 g/dL (ref 3.5–5.0)
Alkaline Phosphatase: 61 U/L (ref 38–126)
Anion gap: 5 (ref 5–15)
BUN: 20 mg/dL (ref 8–23)
CO2: 29 mmol/L (ref 22–32)
Calcium: 9.2 mg/dL (ref 8.9–10.3)
Chloride: 104 mmol/L (ref 98–111)
Creatinine: 0.76 mg/dL (ref 0.61–1.24)
GFR, Estimated: >60 mL/min (ref >60)
Glucose, Bld: 87 mg/dL (ref 70–99)
Potassium: 4.5 mmol/L (ref 3.5–5.1)
Sodium: 138 mmol/L (ref 135–145)
Total Bilirubin: 0.5 mg/dL (ref 0.3–1.2)
Total Protein: 6.8 g/dL (ref 6.5–8.1)

## 2021-11-20 MED ORDER — SODIUM CHLORIDE 0.9% FLUSH
10.0000 mL | INTRAVENOUS | Status: AC | PRN
  Administered 2021-11-20: 10 mL

## 2021-11-20 MED ORDER — MORPHINE SULFATE 15 MG PO TABS: 15.0000 mg | ORAL_TABLET | ORAL | 0 refills | Status: DC | PRN

## 2021-11-20 MED ORDER — FAMOTIDINE IN NACL 20-0.9 MG/50ML-% IV SOLN
20.0000 mg | Freq: Once | INTRAVENOUS | Status: AC
  Administered 2021-11-20: 20 mg via INTRAVENOUS
  Filled 2021-11-20: qty 50

## 2021-11-20 MED ORDER — HEPARIN SOD (PORK) LOCK FLUSH 100 UNIT/ML IV SOLN
500.0000 [IU] | Freq: Once | INTRAVENOUS | Status: AC | PRN
  Administered 2021-11-20: 500 [IU]

## 2021-11-20 MED ORDER — DIPHENHYDRAMINE HCL 50 MG/ML IJ SOLN
25.0000 mg | Freq: Once | INTRAMUSCULAR | Status: AC
  Administered 2021-11-20: 25 mg via INTRAVENOUS
  Filled 2021-11-20: qty 1

## 2021-11-20 MED ORDER — FENTANYL 50 MCG/HR TD PT72: 1.0000 | MEDICATED_PATCH | TRANSDERMAL | 0 refills | Status: DC

## 2021-11-20 MED ORDER — SODIUM CHLORIDE 0.9% FLUSH
10.0000 mL | INTRAVENOUS | Status: DC | PRN
  Administered 2021-11-20: 10 mL

## 2021-11-20 MED ORDER — SODIUM CHLORIDE 0.9 % IV SOLN: Freq: Once | INTRAVENOUS | Status: AC

## 2021-11-20 MED ORDER — SODIUM CHLORIDE 0.9 % IV SOLN
200.0000 mg | Freq: Once | INTRAVENOUS | Status: AC
  Administered 2021-11-20: 200 mg via INTRAVENOUS
  Filled 2021-11-20: qty 200

## 2021-11-20 NOTE — Patient Instructions (Signed)
[facility] MEDICAL ONCOLOGY  Discharge Instructions: ?Thank you for choosing [facility] to provide your oncology and hematology care.  ? ?If you have a lab appointment with the Cancer Center, please go directly to the Cancer Center and check in at the registration area. ?  ?Wear comfortable clothing and clothing appropriate for easy access to any Portacath or PICC line.  ? ?We strive to give you quality time with your provider. You may need to reschedule your appointment if you arrive late (15 or more minutes).  Arriving late affects you and other patients whose appointments are after yours.  Also, if you miss three or more appointments without notifying the office, you may be dismissed from the clinic at the provider?s discretion.    ?  ?For prescription refill requests, have your pharmacy contact our office and allow 72 hours for refills to be completed.   ? ?Today you received the following chemotherapy and/or immunotherapy agents: Keytruda ?  ?To help prevent nausea and vomiting after your treatment, we encourage you to take your nausea medication as directed. ? ?BELOW ARE SYMPTOMS THAT SHOULD BE REPORTED IMMEDIATELY: ?*FEVER GREATER THAN 100.4 F (38 ?C) OR HIGHER ?*CHILLS OR SWEATING ?*NAUSEA AND VOMITING THAT IS NOT CONTROLLED WITH YOUR NAUSEA MEDICATION ?*UNUSUAL SHORTNESS OF BREATH ?*UNUSUAL BRUISING OR BLEEDING ?*URINARY PROBLEMS (pain or burning when urinating, or frequent urination) ?*BOWEL PROBLEMS (unusual diarrhea, constipation, pain near the anus) ?TENDERNESS IN MOUTH AND THROAT WITH OR WITHOUT PRESENCE OF ULCERS (sore throat, sores in mouth, or a toothache) ?UNUSUAL RASH, SWELLING OR PAIN  ?UNUSUAL VAGINAL DISCHARGE OR ITCHING  ? ?Items with * indicate a potential emergency and should be followed up as soon as possible or go to the Emergency Department if any problems should occur. ? ?Please show the CHEMOTHERAPY ALERT CARD or IMMUNOTHERAPY ALERT CARD at check-in to the  Emergency Department and triage nurse. ? ?Should you have questions after your visit or need to cancel or reschedule your appointment, please contact [facility]  Dept: [phone]  and follow the prompts.  Office hours are 8:00 a.m. to 4:30 p.m. Monday - Friday. Please note that voicemails left after 4:00 p.m. may not be returned until the following business day.  We are closed weekends and major holidays. You have access to a nurse at all times for urgent questions. Please call the main number to the clinic Dept: [phone] and follow the prompts. ? ? ?For any non-urgent questions, you may also contact your provider using MyChart. We now offer e-Visits for anyone 18 and older to request care online for non-urgent symptoms. For details visit [email or web address]. ?  ?Also download the MyChart app! Go to the app store, search "MyChart", open the app, select [facility], and log in with your MyChart username and password. ? ?Due to Covid, a mask is required upon entering the hospital/clinic. If you do not have a mask, one will be given to you upon arrival. For doctor visits, patients may have 1 support person aged 18 or older with them. For treatment visits, patients cannot have anyone with them due to current Covid guidelines and our immunocompromised population.  ? ?

## 2021-11-26 ENCOUNTER — Encounter: Payer: Self-pay | Admitting: Hematology

## 2021-11-26 NOTE — Progress Notes (Signed)
[facility] Follow up: ?  ? ?Kyle Coss, NP ?[address] ?[place] [address] ? ?DOS .11/20/2021 ? ?CC: Follow-up for continued evaluation and management of lung cancer ? ? ?DIAGNOSIS: Stage IV lung cancer ? ?SUMMARY OF ONCOLOGIC HISTORY: ?Oncology History  ?Metastatic cancer ([place])  ?10/19/2018 Initial Diagnosis  ? Metastatic cancer [facility]) ?  ?11/24/2018 -  Chemotherapy  ? Patient is on Treatment Plan : LUNG NSCLC Carboplatin + Paclitaxel + Pembrolizumab q21d x 4 cycles / Pembrolizumab Maintenance Q21D  ?   ?Bone metastases ([place])  ?10/19/2018 Initial Diagnosis  ? Bone metastases ([place]) ?  ?11/24/2018 -  Chemotherapy  ? The patient had dexamethasone (DECADRON) 4 MG tablet, 1 of 1 cycle, Start date: 11/24/2018, End date: 04/27/2019 ?palonosetron (ALOXI) injection 0.25 mg, 0.25 mg, Intravenous,  Once, 5 of 5 cycles ?Administration: 0.25 mg (11/24/2018), 0.25 mg (12/15/2018), 0.25 mg (01/05/2019), 0.25 mg (01/26/2019), 0.25 mg (02/16/2019) ?pegfilgrastim (NEULASTA ONPRO KIT) injection 6 mg, 6 mg, Subcutaneous, Once, 3 of 3 cycles ?Administration: 6 mg (01/05/2019), 6 mg (01/26/2019), 6 mg (02/16/2019) ?pegfilgrastim-cbqv (UDENYCA) injection 6 mg, 6 mg, Subcutaneous, Once, 2 of 2 cycles ?Administration: 6 mg (11/26/2018), 6 mg (12/17/2018) ?CARBOplatin (PARAPLATIN) 410 mg in sodium chloride 0.9 % 250 mL chemo infusion, 410 mg (108 % of original dose 381.5 mg), Intravenous,  Once, 5 of 5 cycles ?Dose modification:   (original dose 381.5 mg, Cycle 1) ?Administration: 410 mg (11/24/2018), 410 mg (12/15/2018), 410 mg (01/05/2019), 410 mg (01/26/2019), 410 mg (02/16/2019) ?PACLitaxel (TAXOL) 234 mg in sodium chloride 0.9 % 250 mL chemo infusion (> 44m/m2), 135 mg/m2 = 234 mg (100 % of original dose 135 mg/m2), Intravenous,  Once, 5 of 5 cycles ?Dose modification: 150 mg/m2 (original dose 135 mg/m2, Cycle 1, Reason: Provider Judgment), 135 mg/m2 (original dose 135 mg/m2, Cycle 1, Reason: Provider Judgment), 150  mg/m2 (original dose 135 mg/m2, Cycle 2, Reason: Catheter Related Infection) ?Administration: 234 mg (11/24/2018), 258 mg (12/15/2018), 258 mg (01/05/2019), 258 mg (01/26/2019), 258 mg (02/16/2019) ?pembrolizumab (KEYTRUDA) 200 mg in sodium chloride 0.9 % 50 mL chemo infusion, 200 mg, Intravenous, Once, 27 of 29 cycles ?Administration: 200 mg (11/24/2018), 200 mg (12/15/2018), 200 mg (01/05/2019), 200 mg (01/26/2019), 200 mg (02/16/2019), 200 mg (03/09/2019), 200 mg (03/30/2019), 200 mg (04/20/2019), 200 mg (05/11/2019), 200 mg (06/01/2019), 200 mg (06/22/2019), 200 mg (07/13/2019), 200 mg (08/03/2019), 200 mg (09/14/2019), 200 mg (08/24/2019), 200 mg (10/05/2019), 200 mg (10/26/2019), 200 mg (11/16/2019), 200 mg (12/07/2019), 200 mg (12/28/2019), 200 mg (01/18/2020), 200 mg (02/08/2020), 200 mg (02/29/2020), 200 mg (03/21/2020), 200 mg (04/11/2020), 200 mg (05/02/2020), 200 mg (05/24/2020) ?fosaprepitant (EMEND) 150 mg, dexamethasone (DECADRON) 12 mg in sodium chloride 0.9 % 145 mL IVPB, , Intravenous,  Once, 5 of 5 cycles ?Administration:  (11/24/2018),  (12/15/2018),  (01/05/2019),  (01/26/2019),  (02/16/2019) ? ? for chemotherapy treatment.  ? ?  ?Squamous cell lung cancer, left ([facility]  ?11/24/2018 Initial Diagnosis  ? Squamous cell lung cancer, left ([place] ?  ?11/24/2018 -  Chemotherapy  ? The patient had dexamethasone (DECADRON) 4 MG tablet, 1 of 1 cycle, Start date: 11/24/2018, End date: 04/27/2019 ?palonosetron (ALOXI) injection 0.25 mg, 0.25 mg, Intravenous,  Once, 5 of 5 cycles ?Administration: 0.25 mg (11/24/2018), 0.25 mg (12/15/2018), 0.25 mg (01/05/2019), 0.25 mg (01/26/2019), 0.25 mg (02/16/2019) ?pegfilgrastim (NEULASTA ONPRO KIT) injection 6 mg, 6 mg, Subcutaneous, Once, 3 of 3 cycles ?Administration: 6 mg (01/05/2019), 6 mg (01/26/2019), 6 mg (02/16/2019) ?pegfilgrastim-cbqv (UDENYCA) injection  6 mg, 6 mg, Subcutaneous, Once, 2 of 2 cycles ?Administration: 6 mg (11/26/2018), 6 mg (12/17/2018) ?CARBOplatin (PARAPLATIN) 410 mg in sodium chloride 0.9 % 250 mL  chemo infusion, 410 mg (108 % of original dose 381.5 mg), Intravenous,  Once, 5 of 5 cycles ?Dose modification:   (original dose 381.5 mg, Cycle 1) ?Administration: 410 mg (11/24/2018), 410 mg (12/15/2018), 410 mg (01/05/2019), 410 mg (01/26/2019), 410 mg (02/16/2019) ?PACLitaxel (TAXOL) 234 mg in sodium chloride 0.9 % 250 mL chemo infusion (> 100m/m2), 135 mg/m2 = 234 mg (100 % of original dose 135 mg/m2), Intravenous,  Once, 5 of 5 cycles ?Dose modification: 150 mg/m2 (original dose 135 mg/m2, Cycle 1, Reason: Provider Judgment), 135 mg/m2 (original dose 135 mg/m2, Cycle 1, Reason: Provider Judgment), 150 mg/m2 (original dose 135 mg/m2, Cycle 2, Reason: Catheter Related Infection) ?Administration: 234 mg (11/24/2018), 258 mg (12/15/2018), 258 mg (01/05/2019), 258 mg (01/26/2019), 258 mg (02/16/2019) ?pembrolizumab (KEYTRUDA) 200 mg in sodium chloride 0.9 % 50 mL chemo infusion, 200 mg, Intravenous, Once, 27 of 29 cycles ?Administration: 200 mg (11/24/2018), 200 mg (12/15/2018), 200 mg (01/05/2019), 200 mg (01/26/2019), 200 mg (02/16/2019), 200 mg (03/09/2019), 200 mg (03/30/2019), 200 mg (04/20/2019), 200 mg (05/11/2019), 200 mg (06/01/2019), 200 mg (06/22/2019), 200 mg (07/13/2019), 200 mg (08/03/2019), 200 mg (09/14/2019), 200 mg (08/24/2019), 200 mg (10/05/2019), 200 mg (10/26/2019), 200 mg (11/16/2019), 200 mg (12/07/2019), 200 mg (12/28/2019), 200 mg (01/18/2020), 200 mg (02/08/2020), 200 mg (02/29/2020), 200 mg (03/21/2020), 200 mg (04/11/2020), 200 mg (05/02/2020), 200 mg (05/24/2020) ?fosaprepitant (EMEND) 150 mg, dexamethasone (DECADRON) 12 mg in sodium chloride 0.9 % 145 mL IVPB, , Intravenous,  Once, 5 of 5 cycles ?Administration:  (11/24/2018),  (12/15/2018),  (01/05/2019),  (01/26/2019),  (02/16/2019) ? ? for chemotherapy treatment.  ? ?  ? ? ?CURRENT THERAPY: Keytruda ? ?INTERVAL HISTORY: ? ?Kyle Foster Stage89y.o. male who is here for continued valuation management of his metastatic squamous cell carcinoma.  He is accompanied by his wife for this  clinic visit. ? ?He notes no acute new symptoms since his last clinic visit. ?He had a modified barium swallow which showed no overt evidence of aspiration.  Has completed his evaluation with the swallow evaluation team. ?CT chest done on 11/15/2021 showed postradiation changes in the left lung which is stable.  Previously noted areas of nodularity in the medial aspect of the right lower lobe has resolved suggesting either benign or infectious etiology.  It appears aspiration pneumonia cleared with antibiotics. ?No signs of new metastatic disease in the thorax. ?He has gained about 3 pounds over the last couple of months and has been eating well. ? ? ?Patient Active Problem List  ? Diagnosis Date Noted  ? Persistent cough for 3 weeks or longer 07/08/2021  ? Left ear pain 07/08/2021  ? Recurrent productive cough 06/28/2021  ? Left lower lobe pneumonia 02/20/2020  ? Pulmonary infarct ([facility] 02/19/2020  ? Aspiration pneumonia of left lower lobe due to gastric secretions ([facility] 02/19/2020  ? Squamous cell carcinoma of lung, stage IV ([facility] 02/19/2020  ? GERD without esophagitis 02/19/2020  ? COPD with chronic bronchitis and emphysema ([facility] 05/02/2019  ? 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  ? H/O agent Orange exposure 03/22/2019  ? Centrilobular emphysema ([facility] [place] 03/21/2019  ? Squamous cell lung cancer, left ([facility] 11/24/2018  ? Counseling regarding advance care planning and goals of care 11/24/2018  ? Metastatic cancer ([facility]   ? Malignant neoplasm of upper lobe of left lung ([facility]   ?  Bone metastases ([place])   ? Severe protein-calorie malnutrition ([place]) 10/15/2018  ? Palliative care by specialist   ? Goals of care, counseling/discussion   ? Cancer associated pain 10/14/2018  ? CAD S/P percutaneous coronary angioplasty 07/13/2013  ? Essential hypertension 07/13/2013  ? Mixed hyperlipidemia 07/13/2013  ? Tobacco abuse 07/13/2013  ? Coronary artery disease 07/13/2013  ? 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  ?  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 ? ?is allergic to codeine. ? ?MEDICAL HISTORY: ?Past Medical History:  ?Diagnosis Date  ? Allergy   ? Bladder cance

## 2021-11-28 DIAGNOSIS — R6884 Jaw pain: Secondary | ICD-10-CM | POA: Diagnosis not present

## 2021-12-03 ENCOUNTER — Encounter: Payer: Self-pay | Admitting: Cardiovascular Disease

## 2021-12-03 ENCOUNTER — Ambulatory Visit (INDEPENDENT_AMBULATORY_CARE_PROVIDER_SITE_OTHER): Payer: No Typology Code available for payment source | Admitting: Cardiovascular Disease

## 2021-12-03 ENCOUNTER — Other Ambulatory Visit: Payer: Self-pay

## 2021-12-03 VITALS — BP 94/54 | HR 73 | Ht 70.0 in | Wt 124.0 lb

## 2021-12-03 DIAGNOSIS — E785 Hyperlipidemia, unspecified: Secondary | ICD-10-CM | POA: Diagnosis not present

## 2021-12-03 DIAGNOSIS — I452 Bifascicular block: Secondary | ICD-10-CM

## 2021-12-03 DIAGNOSIS — Z9861 Coronary angioplasty status: Secondary | ICD-10-CM

## 2021-12-03 DIAGNOSIS — I2510 Atherosclerotic heart disease of native coronary artery without angina pectoris: Secondary | ICD-10-CM

## 2021-12-03 DIAGNOSIS — I10 Essential (primary) hypertension: Secondary | ICD-10-CM

## 2021-12-03 DIAGNOSIS — E782 Mixed hyperlipidemia: Secondary | ICD-10-CM

## 2021-12-03 DIAGNOSIS — Z720 Tobacco use: Secondary | ICD-10-CM | POA: Diagnosis not present

## 2021-12-03 NOTE — Patient Instructions (Signed)

## 2021-12-03 NOTE — Assessment & Plan Note (Signed)
Chronic. 

## 2021-12-03 NOTE — Assessment & Plan Note (Signed)
History of essential hypertension in the past however today he is hypotensive with a blood pressure of 94/54.  Because of recent hypotension his beta-blocker was cut in half which really has not changed his blood pressure.  He denies dizziness or syncope. ?

## 2021-12-03 NOTE — Assessment & Plan Note (Signed)
History of CAD status post non-STEMI 06/30/2008.  I catheterized him and stented his AV groove circumflex and marginal branch bifurcation with driver bare-metal stents.  He did have tandem 70% lesions in his RCA and normal LV function.  He denies chest pain or shortness of breath. ?

## 2021-12-03 NOTE — Assessment & Plan Note (Signed)
History of hyperlipidemia on statin therapy with lipid profile performed 09/27/2021 revealing a total cholesterol 142, LDL 71 and HDL 54. ?

## 2021-12-03 NOTE — Progress Notes (Signed)
? ? ? ?12/03/2021 ?KRISTI HYER   ?1946/05/16  ?[id number] ? ?Primary Physician [name], NP ?Primary Cardiologist: Lorretta Harp MD [facility], [place], [place] ? ?HPI:  Kyle Foster is a 76 y.o.  fit-appearing married Caucasian male, father of 17 and grandfather of 3 grandchildren, whom I last saw in the office 05/08/2020.Marland Kitchen  He has a history of CAD status post non-ST-segment elevation myocardial infarction June 30, 2008. They catheterized him and stented his AV-groove circumflex and marginal branch bifurcation with Driver bare-metal stents. He did have tandem 70% lesions in his RCA and normal LV function. He has been asymptomatic since. He continues to smoke 5 cigarettes a day. Patient is positive for hypertension and hyperlipidemia. ?  ?He had developed stage IV lung cancer (squamous cell) with metastasis to his spine and bones.  He has had radiation therapy, chemotherapy and now is on immunotherapy.  He does continue to smoke 5 to 10 cigarettes a day.   ? ?Since I saw him a year and a half ago he continues to do well.  He gets immunotherapy every 3 weeks.  He denies chest pain or shortness of breath. ? ? ?Current Meds  ?Medication Sig  ? acetaminophen (TYLENOL) 500 MG tablet Take 1,000 mg by mouth every 8 (eight) hours as needed for moderate pain.  ? acetic acid-hydrocortisone (VOSOL-HC) OTIC solution Place 3 drops into the left ear 3 (three) times daily as needed.  ? atorvastatin (LIPITOR) 80 MG tablet Take 1 tablet (80 mg total) by mouth daily. NEEDS APPOINTMENT WITH DR Gwenlyn Found FOR FUTURE REFILLS  ? B Complex-C (B-COMPLEX WITH VITAMIN C) tablet Take 1 tablet by mouth daily.  ? calcium carbonate (TUMS - DOSED IN MG ELEMENTAL CALCIUM) 500 MG chewable tablet Chew 1 tablet (200 mg of elemental calcium total) by mouth 3 (three) times daily with meals. (Patient taking differently: Chew 1 tablet by mouth 3 (three) times daily as needed for indigestion.)  ? Cholecalciferol (VITAMIN D) 2000 UNITS tablet Take  2,000 Units by mouth daily.  ? clopidogrel (PLAVIX) 75 MG tablet Take 1 tablet (75 mg total) by mouth daily. NEEDS APPOINTMENT FOR FUTURE REFILLS  ? DENTA 5000 PLUS 1.1 % CREA dental cream Place 1 application onto teeth at bedtime.  ? DULoxetine (CYMBALTA) 30 MG capsule TAKE 1 CAPSULE BY MOUTH EVERY DAY  ? fentaNYL (DURAGESIC) 50 MCG/HR Place 1 patch onto the skin every 3 (three) days.  ? Hypromellose (ARTIFICIAL TEARS OP) Place 1 drop into both eyes at bedtime.   ? lidocaine-prilocaine (EMLA) cream Apply 1 application topically as needed (access port).  ? loratadine (CLARITIN) 10 MG tablet Take 10 mg by mouth daily as needed for allergies.  ? magnesium oxide (MAG-OX) 400 MG tablet Take 1 tablet (400 mg total) by mouth daily.  ? metoprolol tartrate (LOPRESSOR) 25 MG tablet Take 0.5 tablets (12.5 mg total) by mouth 2 (two) times daily.  ? morphine (MSIR) 15 MG tablet Take 1-2 tablets (15-30 mg total) by mouth every 4 (four) hours as needed for moderate pain or severe pain.  ? ondansetron (ZOFRAN) 8 MG tablet Take 1 tablet (8 mg total) by mouth every 8 (eight) hours as needed for nausea or vomiting.  ? pantoprazole (PROTONIX) 40 MG tablet TAKE 1 TABLET BY MOUTH EVERY DAY (Patient taking differently: Take 40 mg by mouth daily.)  ? TRELEGY ELLIPTA 100-62.5-25 MCG/ACT AEPB TAKE 1 PUFF BY MOUTH EVERY DAY  ?  ? ?Allergies  ?Allergen Reactions  ? Codeine  Other (See Comments)  ?  HEADACHE ? ?headaches  ? ? ?Social History  ? ?Socioeconomic History  ? Marital status: Married  ?  Spouse name: Not on file  ? Number of children: Not on file  ? Years of education: Not on file  ? Highest education level: Not on file  ?Occupational History  ? Not on file  ?Tobacco Use  ? Smoking status: Every Day  ?  Packs/day: 0.50  ?  Years: 52.00  ?  Pack years: 26.00  ?  Types: Cigarettes  ?  Start date: 1968  ? Smokeless tobacco: Never  ? Tobacco comments:  ?  03/21/19- smoking 10 cigs a day   ?Vaping Use  ? Vaping Use: Never used  ?Substance  and Sexual Activity  ? Alcohol use: Never  ? Drug use: Never  ? Sexual activity: Not on file  ?Other Topics Concern  ? Not on file  ?Social History Narrative  ? Not on file  ? ?Social Determinants of Health  ? ?Financial Resource Strain: Not on file  ?Food Insecurity: Not on file  ?Transportation Needs: Not on file  ?Physical Activity: Not on file  ?Stress: Not on file  ?Social Connections: Not on file  ?Intimate Partner Violence: Not on file  ?  ? ?Review of Systems: ?General: negative for chills, fever, night sweats or weight changes.  ?Cardiovascular: negative for chest pain, dyspnea on exertion, edema, orthopnea, palpitations, paroxysmal nocturnal dyspnea or shortness of breath ?Dermatological: negative for rash ?Respiratory: negative for cough or wheezing ?Urologic: negative for hematuria ?Abdominal: negative for nausea, vomiting, diarrhea, bright red blood per rectum, melena, or hematemesis ?Neurologic: negative for visual changes, syncope, or dizziness ?All other systems reviewed and are otherwise negative except as noted above. ? ? ? ?Blood pressure (!) 94/54, pulse 73, height 5\' 10"  (1.778 m), weight 124 lb (56.2 kg).  ?General appearance: alert and no distress ?Neck: no adenopathy, no carotid bruit, no JVD, supple, symmetrical, trachea midline, and thyroid not enlarged, symmetric, no tenderness/mass/nodules ?Lungs: clear to auscultation bilaterally ?Heart: regular rate and rhythm, S1, S2 normal, no murmur, click, rub or gallop ?Extremities: extremities normal, atraumatic, no cyanosis or edema ?Pulses: 2+ and symmetric ?Skin: Skin color, texture, turgor normal. No rashes or lesions ?Neurologic: Grossly normal ? ?EKG normal sinus rhythm at 73 with bifascicular block (right bundle branch block/left anterior fascicular block).  I personally reviewed this EKG. ? ?ASSESSMENT AND PLAN:  ? ?CAD S/P percutaneous coronary angioplasty ?History of CAD status post non-STEMI 06/30/2008.  I catheterized him and stented  his AV groove circumflex and marginal branch bifurcation with driver bare-metal stents.  He did have tandem 70% lesions in his RCA and normal LV function.  He denies chest pain or shortness of breath. ? ?Essential hypertension ?History of essential hypertension in the past however today he is hypotensive with a blood pressure of 94/54.  Because of recent hypotension his beta-blocker was cut in half which really has not changed his blood pressure.  He denies dizziness or syncope. ? ?Mixed hyperlipidemia ?History of hyperlipidemia on statin therapy with lipid profile performed 09/27/2021 revealing a total cholesterol 142, LDL 71 and HDL 54. ? ?Tobacco abuse ?History of ongoing tobacco abuse of 5 to 10 cigarettes a day. ? ?RBBB with left anterior fascicular block ?Chronic ? ? ? ? ?Lorretta Harp MD FACP,FACC,FAHA, FSCAI ?12/03/2021 ?11:40 AM ?

## 2021-12-03 NOTE — Assessment & Plan Note (Signed)
History of ongoing tobacco abuse of 5 to 10 cigarettes a day. ?

## 2021-12-04 ENCOUNTER — Encounter: Payer: Self-pay | Admitting: Registered Nurse

## 2021-12-04 ENCOUNTER — Ambulatory Visit (INDEPENDENT_AMBULATORY_CARE_PROVIDER_SITE_OTHER): Payer: No Typology Code available for payment source | Admitting: Registered Nurse

## 2021-12-04 VITALS — BP 103/69 | HR 79 | Temp 98.3°F | Resp 18 | Ht 70.0 in | Wt 123.2 lb

## 2021-12-04 DIAGNOSIS — J22 Unspecified acute lower respiratory infection: Secondary | ICD-10-CM

## 2021-12-04 MED ORDER — DOXYCYCLINE HYCLATE 100 MG PO TABS: 100.0000 mg | ORAL_TABLET | Freq: Two times a day (BID) | ORAL | 0 refills | Status: DC

## 2021-12-04 MED ORDER — PREDNISONE 10 MG PO TABS: ORAL_TABLET | ORAL | 0 refills | Status: AC

## 2021-12-04 NOTE — Patient Instructions (Addendum)
Mr. Clauss - ? ?Great to see you ? ?Let's get this infection knocked out. ? ?Call if not any better within 48 hours ? ?Thank you ? ?Rich  ? ? ? ?If you have lab work done today you will be contacted with your lab results within the next 2 weeks.  If you have not heard from [place] then please contact us. The fastest way to get your results is to register for My Chart. ? ? ?IF you received an x-ray today, you will receive an invoice from [facility]. Please contact [facility] at [phone] with questions or concerns regarding your invoice.  ? ?IF you received labwork today, you will receive an invoice from [facility]. Please contact [facility] at [phone] with questions or concerns regarding your invoice.  ? ?Our billing staff will not be able to assist you with questions regarding bills from these companies. ? ?You will be contacted with the lab results as soon as they are available. The fastest way to get your results is to activate your My Chart account. Instructions are located on the last page of this paperwork. If you have not heard from [place] regarding the results in 2 weeks, please contact this office. ?  ? ? ?

## 2021-12-04 NOTE — Progress Notes (Signed)
? ?Acute Office Visit ? ?Subjective:  ? ? Patient ID: Kyle Foster, male    DOB: 1945/11/27, 76 y.o.   MRN: [id number] ? ?Chief Complaint  ?Patient presents with  ? Cough  ?  Patient states since Saturday he has been experiencing a cold, cough , runny nose, chills, fever of 100.4, loss of taste, loss of smell, headache , fatigue and SOB. Patient states he tested negative for covid on Monday.  ? ? ?HPI ?Patient is in today for cough ? ?Since Saturday.  ?Chills, congestion, runny nose, fever of 100.36F ?Disturbance to sense of taste and smell.  ?Some fatigue, shob ? ?Wife had something similar -  ?Had amoxicillin and prednisone to clear this up.  ? ?Notable hx for lung ca with mets.  ?He is on immunotherapy.  ? ?Outpatient Medications Prior to Visit  ?Medication Sig Dispense Refill  ? acetaminophen (TYLENOL) 500 MG tablet Take 1,000 mg by mouth every 8 (eight) hours as needed for moderate pain.    ? acetic acid-hydrocortisone (VOSOL-HC) OTIC solution Place 3 drops into the left ear 3 (three) times daily as needed. 10 mL 0  ? atorvastatin (LIPITOR) 80 MG tablet Take 1 tablet (80 mg total) by mouth daily. NEEDS APPOINTMENT WITH DR Gwenlyn Found FOR FUTURE REFILLS 90 tablet 1  ? B Complex-C (B-COMPLEX WITH VITAMIN C) tablet Take 1 tablet by mouth daily.    ? calcium carbonate (TUMS - DOSED IN MG ELEMENTAL CALCIUM) 500 MG chewable tablet Chew 1 tablet (200 mg of elemental calcium total) by mouth 3 (three) times daily with meals. (Patient taking differently: Chew 1 tablet by mouth 3 (three) times daily as needed for indigestion.) 30 tablet 3  ? Cholecalciferol (VITAMIN D) 2000 UNITS tablet Take 2,000 Units by mouth daily.    ? clopidogrel (PLAVIX) 75 MG tablet Take 1 tablet (75 mg total) by mouth daily. NEEDS APPOINTMENT FOR FUTURE REFILLS 90 tablet 3  ? DENTA 5000 PLUS 1.1 % CREA dental cream Place 1 application onto teeth at bedtime.    ? DULoxetine (CYMBALTA) 30 MG capsule TAKE 1 CAPSULE BY MOUTH EVERY DAY 90 capsule 2  ?  fentaNYL (DURAGESIC) 50 MCG/HR Place 1 patch onto the skin every 3 (three) days. 10 patch 0  ? Hypromellose (ARTIFICIAL TEARS OP) Place 1 drop into both eyes at bedtime.     ? lidocaine-prilocaine (EMLA) cream Apply 1 application topically as needed (access port). 30 g 3  ? loratadine (CLARITIN) 10 MG tablet Take 10 mg by mouth daily as needed for allergies.    ? magnesium oxide (MAG-OX) 400 MG tablet Take 1 tablet (400 mg total) by mouth daily. 90 tablet 0  ? metoprolol tartrate (LOPRESSOR) 25 MG tablet Take 0.5 tablets (12.5 mg total) by mouth 2 (two) times daily. 60 tablet 6  ? morphine (MSIR) 15 MG tablet Take 1-2 tablets (15-30 mg total) by mouth every 4 (four) hours as needed for moderate pain or severe pain. 120 tablet 0  ? ondansetron (ZOFRAN) 8 MG tablet Take 1 tablet (8 mg total) by mouth every 8 (eight) hours as needed for nausea or vomiting. 30 tablet 2  ? pantoprazole (PROTONIX) 40 MG tablet TAKE 1 TABLET BY MOUTH EVERY DAY (Patient taking differently: Take 40 mg by mouth daily.) 90 tablet 3  ? TRELEGY ELLIPTA 100-62.5-25 MCG/ACT AEPB TAKE 1 PUFF BY MOUTH EVERY DAY 60 each 1  ? ipratropium-albuterol (DUONEB) 0.5-2.5 (3) MG/3ML SOLN Take 3 mLs by nebulization every 6 (six) hours  as needed. Start with using nebulizer after awakening in the morning, mid-day, and then prior to bedtime for the first week-10 days then back off to as needed as congestion and wheezing improves. 360 mL 0  ? ?No facility-administered medications prior to visit.  ? ? ?Review of Systems  ?Constitutional: Negative.   ?HENT:  Positive for congestion and sinus pressure.   ?Eyes: Negative.   ?Respiratory:  Positive for cough, shortness of breath and wheezing.   ?Cardiovascular: Negative.   ?Gastrointestinal: Negative.   ?Genitourinary: Negative.   ?Musculoskeletal: Negative.   ?Skin: Negative.   ?Neurological: Negative.   ?Psychiatric/Behavioral: Negative.    ?All other systems reviewed and are negative. ? ?   ?Objective:  ?  ?BP  103/69   Pulse 79   Temp 98.3 ?F (36.8 ?C) (Temporal)   Resp 18   Ht 5\' 10"  (1.778 m)   Wt 123 lb 3.2 oz (55.9 kg)   SpO2 97%   BMI 17.68 kg/m?  ?Physical Exam ?Constitutional:   ?   General: He is not in acute distress. ?   Appearance: Normal appearance. He is normal weight. He is not ill-appearing, toxic-appearing or diaphoretic.  ?Cardiovascular:  ?   Rate and Rhythm: Normal rate and regular rhythm.  ?   Heart sounds: Normal heart sounds. No murmur heard. ?  No friction rub. No gallop.  ?Pulmonary:  ?   Effort: Pulmonary effort is normal. No respiratory distress.  ?   Breath sounds: No stridor. Wheezing (throughout) present. No rhonchi or rales.  ?Chest:  ?   Chest wall: No tenderness.  ?Neurological:  ?   General: No focal deficit present.  ?   Mental Status: He is alert and oriented to person, place, and time. Mental status is at baseline.  ?Psychiatric:     ?   Mood and Affect: Mood normal.     ?   Behavior: Behavior normal.     ?   Thought Content: Thought content normal.     ?   Judgment: Judgment normal.  ? ? ?No results found for any visits on 12/04/21. ? ? ?   ?Assessment & Plan:  ?1. Lower respiratory infection ?- doxycycline (VIBRA-TABS) 100 MG tablet; Take 1 tablet (100 mg total) by mouth 2 (two) times daily.  Dispense: 20 tablet; Refill: 0 ?- predniSONE (DELTASONE) 10 MG tablet; Take 3 tablets (30 mg total) by mouth daily with breakfast for 3 days, THEN 2 tablets (20 mg total) daily with breakfast for 3 days, THEN 1 tablet (10 mg total) daily with breakfast for 3 days.  Dispense: 18 tablet; Refill: 0 ? ? ? ?Meds ordered this encounter  ?Medications  ? doxycycline (VIBRA-TABS) 100 MG tablet  ?  Sig: Take 1 tablet (100 mg total) by mouth 2 (two) times daily.  ?  Dispense:  20 tablet  ?  Refill:  0  ?  Order Specific Question:   Supervising Provider  ?  Answer:   Carlota Raspberry, JEFFREY R [[id number]]  ? predniSONE (DELTASONE) 10 MG tablet  ?  Sig: Take 3 tablets (30 mg total) by mouth daily with breakfast for  3 days, THEN 2 tablets (20 mg total) daily with breakfast for 3 days, THEN 1 tablet (10 mg total) daily with breakfast for 3 days.  ?  Dispense:  18 tablet  ?  Refill:  0  ?  Order Specific Question:   Supervising Provider  ?  Answer:   Carlota Raspberry, JEFFREY R [[id number]]  ? ? ?  Return in about 6 months (around 06/06/2022) for Chronic Conditions. ? ?PLAN ?Symptoms x 5 days but will start abx and prednisone given his complicated health status, will start abx and prednisone today.  ?Close follow up if not improved within 48 hours ?Patient encouraged to call clinic with any questions, comments, or concerns. ? ?[name], NP ?

## 2021-12-05 DIAGNOSIS — Z85828 Personal history of other malignant neoplasm of skin: Secondary | ICD-10-CM | POA: Diagnosis not present

## 2021-12-05 DIAGNOSIS — C44229 Squamous cell carcinoma of skin of left ear and external auricular canal: Secondary | ICD-10-CM | POA: Diagnosis not present

## 2021-12-08 ENCOUNTER — Encounter: Payer: Self-pay | Admitting: Hematology

## 2021-12-09 DIAGNOSIS — C07 Malignant neoplasm of parotid gland: Secondary | ICD-10-CM | POA: Diagnosis not present

## 2021-12-10 DIAGNOSIS — Z8551 Personal history of malignant neoplasm of bladder: Secondary | ICD-10-CM | POA: Diagnosis not present

## 2021-12-10 DIAGNOSIS — N402 Nodular prostate without lower urinary tract symptoms: Secondary | ICD-10-CM | POA: Diagnosis not present

## 2021-12-11 ENCOUNTER — Other Ambulatory Visit: Payer: Self-pay | Admitting: Hematology

## 2021-12-11 ENCOUNTER — Inpatient Hospital Stay: Payer: No Typology Code available for payment source | Attending: Hematology

## 2021-12-11 ENCOUNTER — Inpatient Hospital Stay: Payer: No Typology Code available for payment source

## 2021-12-11 ENCOUNTER — Other Ambulatory Visit: Payer: Self-pay

## 2021-12-11 VITALS — BP 122/65 | HR 66 | Temp 98.3°F | Resp 18 | Wt 122.5 lb

## 2021-12-11 DIAGNOSIS — C3412 Malignant neoplasm of upper lobe, left bronchus or lung: Secondary | ICD-10-CM | POA: Diagnosis present

## 2021-12-11 DIAGNOSIS — Z923 Personal history of irradiation: Secondary | ICD-10-CM | POA: Insufficient documentation

## 2021-12-11 DIAGNOSIS — Z5112 Encounter for antineoplastic immunotherapy: Secondary | ICD-10-CM | POA: Insufficient documentation

## 2021-12-11 DIAGNOSIS — F17210 Nicotine dependence, cigarettes, uncomplicated: Secondary | ICD-10-CM | POA: Insufficient documentation

## 2021-12-11 DIAGNOSIS — C7951 Secondary malignant neoplasm of bone: Secondary | ICD-10-CM | POA: Insufficient documentation

## 2021-12-11 DIAGNOSIS — C3490 Malignant neoplasm of unspecified part of unspecified bronchus or lung: Secondary | ICD-10-CM

## 2021-12-11 DIAGNOSIS — Z7189 Other specified counseling: Secondary | ICD-10-CM

## 2021-12-11 DIAGNOSIS — C3492 Malignant neoplasm of unspecified part of left bronchus or lung: Secondary | ICD-10-CM

## 2021-12-11 DIAGNOSIS — Z95828 Presence of other vascular implants and grafts: Secondary | ICD-10-CM

## 2021-12-11 LAB — CMP (CANCER CENTER ONLY)
ALT: 22 U/L (ref 0–44)
AST: 21 U/L (ref 15–41)
Albumin: 3.5 g/dL (ref 3.5–5.0)
Alkaline Phosphatase: 55 U/L (ref 38–126)
Anion gap: 5 (ref 5–15)
BUN: 31 mg/dL — ABNORMAL HIGH (ref 8–23)
CO2: 29 mmol/L (ref 22–32)
Calcium: 9.1 mg/dL (ref 8.9–10.3)
Chloride: 107 mmol/L (ref 98–111)
Creatinine: 0.92 mg/dL (ref 0.61–1.24)
GFR, Estimated: >60 mL/min (ref >60)
Glucose, Bld: 97 mg/dL (ref 70–99)
Potassium: 4.6 mmol/L (ref 3.5–5.1)
Sodium: 141 mmol/L (ref 135–145)
Total Bilirubin: 0.2 mg/dL — ABNORMAL LOW (ref 0.3–1.2)
Total Protein: 6.9 g/dL (ref 6.5–8.1)

## 2021-12-11 LAB — CBC WITH DIFFERENTIAL (CANCER CENTER ONLY)
Abs Immature Granulocytes: 0.08 10*3/uL — ABNORMAL HIGH (ref 0.00–0.07)
Basophils Absolute: 0 10*3/uL (ref 0.0–0.1)
Basophils Relative: 0 %
Eosinophils Absolute: 0.1 10*3/uL (ref 0.0–0.5)
Eosinophils Relative: 1 %
HCT: 36 % — ABNORMAL LOW (ref 39.0–52.0)
Hemoglobin: 12 g/dL — ABNORMAL LOW (ref 13.0–17.0)
Immature Granulocytes: 1 %
Lymphocytes Relative: 4 %
Lymphs Abs: 0.5 10*3/uL — ABNORMAL LOW (ref 0.7–4.0)
MCH: 31.5 pg (ref 26.0–34.0)
MCHC: 33.3 g/dL (ref 30.0–36.0)
MCV: 94.5 fL (ref 80.0–100.0)
Monocytes Absolute: 0.4 10*3/uL (ref 0.1–1.0)
Monocytes Relative: 3 %
Neutro Abs: 12.2 10*3/uL — ABNORMAL HIGH (ref 1.7–7.7)
Neutrophils Relative %: 91 %
Platelet Count: 242 10*3/uL (ref 150–400)
RBC: 3.81 MIL/uL — ABNORMAL LOW (ref 4.22–5.81)
RDW: 13.9 % (ref 11.5–15.5)
WBC Count: 13.3 10*3/uL — ABNORMAL HIGH (ref 4.0–10.5)
nRBC: 0 % (ref 0.0–0.2)

## 2021-12-11 LAB — TSH: TSH: 0.843 u[IU]/mL (ref 0.320–4.118)

## 2021-12-11 MED ORDER — DIPHENHYDRAMINE HCL 50 MG/ML IJ SOLN
25.0000 mg | Freq: Once | INTRAMUSCULAR | Status: AC
  Administered 2021-12-11: 25 mg via INTRAVENOUS
  Filled 2021-12-11: qty 1

## 2021-12-11 MED ORDER — SODIUM CHLORIDE 0.9% FLUSH
10.0000 mL | INTRAVENOUS | Status: DC | PRN
  Administered 2021-12-11: 10 mL

## 2021-12-11 MED ORDER — FAMOTIDINE IN NACL 20-0.9 MG/50ML-% IV SOLN
20.0000 mg | Freq: Once | INTRAVENOUS | Status: AC
  Administered 2021-12-11: 20 mg via INTRAVENOUS
  Filled 2021-12-11: qty 50

## 2021-12-11 MED ORDER — SODIUM CHLORIDE 0.9% FLUSH
10.0000 mL | INTRAVENOUS | Status: AC | PRN
  Administered 2021-12-11: 10 mL

## 2021-12-11 MED ORDER — HEPARIN SOD (PORK) LOCK FLUSH 100 UNIT/ML IV SOLN
500.0000 [IU] | Freq: Once | INTRAVENOUS | Status: AC | PRN
  Administered 2021-12-11: 500 [IU]

## 2021-12-11 MED ORDER — SODIUM CHLORIDE 0.9 % IV SOLN: Freq: Once | INTRAVENOUS | Status: AC

## 2021-12-11 MED ORDER — SODIUM CHLORIDE 0.9 % IV SOLN
200.0000 mg | Freq: Once | INTRAVENOUS | Status: AC
  Administered 2021-12-11: 200 mg via INTRAVENOUS
  Filled 2021-12-11: qty 200

## 2021-12-11 NOTE — Patient Instructions (Signed)
[facility]  Discharge Instructions: ?Thank you for choosing [facility] to provide your oncology and hematology care.  ? ?If you have a lab appointment with the [facility], please go directly to the [facility] and check in at the registration area. ?  ?Wear comfortable clothing and clothing appropriate for easy access to any Portacath or PICC line.  ? ?We strive to give you quality time with your provider. You may need to reschedule your appointment if you arrive late (15 or more minutes).  Arriving late affects you and other patients whose appointments are after yours.  Also, if you miss three or more appointments without notifying the office, you may be dismissed from the clinic at the provider?s discretion.    ?  ?For prescription refill requests, have your pharmacy contact our office and allow 72 hours for refills to be completed.   ? ?Today you received the following chemotherapy and/or immunotherapy agents keytruda    ?  ?To help prevent nausea and vomiting after your treatment, we encourage you to take your nausea medication as directed. ? ?BELOW ARE SYMPTOMS THAT SHOULD BE REPORTED IMMEDIATELY: ?*FEVER GREATER THAN 100.4 F (38 ?C) OR HIGHER ?*CHILLS OR SWEATING ?*NAUSEA AND VOMITING THAT IS NOT CONTROLLED WITH YOUR NAUSEA MEDICATION ?*UNUSUAL SHORTNESS OF BREATH ?*UNUSUAL BRUISING OR BLEEDING ?*URINARY PROBLEMS (pain or burning when urinating, or frequent urination) ?*BOWEL PROBLEMS (unusual diarrhea, constipation, pain near the anus) ?TENDERNESS IN MOUTH AND THROAT WITH OR WITHOUT PRESENCE OF ULCERS (sore throat, sores in mouth, or a toothache) ?UNUSUAL RASH, SWELLING OR PAIN  ?UNUSUAL VAGINAL DISCHARGE OR ITCHING  ? ?Items with * indicate a potential emergency and should be followed up as soon as possible or go to the Emergency Department if any problems should occur. ? ?Please show the CHEMOTHERAPY ALERT CARD or IMMUNOTHERAPY ALERT CARD at check-in to  the Emergency Department and triage nurse. ? ?Should you have questions after your visit or need to cancel or reschedule your appointment, please contact [facility]  Dept: [phone]  and follow the prompts.  Office hours are 8:00 a.m. to 4:30 p.m. Monday - Friday. Please note that voicemails left after 4:00 p.m. may not be returned until the following business day.  We are closed weekends and major holidays. You have access to a nurse at all times for urgent questions. Please call the main number to the clinic Dept: [phone] and follow the prompts. ? ? ?For any non-urgent questions, you may also contact your provider using MyChart. We now offer e-Visits for anyone 1 and older to request care online for non-urgent symptoms. For details visit [email or web address]. ?  ?Also download the MyChart app! Go to the app store, search "MyChart", open the app, select [facility], and log in with your MyChart username and password. ? ?Due to Covid, a mask is required upon entering the hospital/clinic. If you do not have a mask, one will be given to you upon arrival. For doctor visits, patients may have 1 support person aged 67 or older with them. For treatment visits, patients cannot have anyone with them due to current Covid guidelines and our immunocompromised population.  ? ?

## 2021-12-12 DIAGNOSIS — C44629 Squamous cell carcinoma of skin of left upper limb, including shoulder: Secondary | ICD-10-CM | POA: Diagnosis not present

## 2021-12-12 DIAGNOSIS — Z85828 Personal history of other malignant neoplasm of skin: Secondary | ICD-10-CM | POA: Diagnosis not present

## 2021-12-12 DIAGNOSIS — C44619 Basal cell carcinoma of skin of left upper limb, including shoulder: Secondary | ICD-10-CM | POA: Diagnosis not present

## 2021-12-20 ENCOUNTER — Other Ambulatory Visit: Payer: Self-pay

## 2021-12-20 ENCOUNTER — Other Ambulatory Visit: Payer: Self-pay | Admitting: Family

## 2021-12-20 DIAGNOSIS — C3492 Malignant neoplasm of unspecified part of left bronchus or lung: Secondary | ICD-10-CM

## 2021-12-20 MED ORDER — FENTANYL 50 MCG/HR TD PT72: 1.0000 | MEDICATED_PATCH | TRANSDERMAL | 0 refills | Status: DC

## 2021-12-20 MED ORDER — ONDANSETRON HCL 8 MG PO TABS: 8.0000 mg | ORAL_TABLET | Freq: Three times a day (TID) | ORAL | 2 refills | Status: DC | PRN

## 2021-12-20 MED ORDER — MORPHINE SULFATE 15 MG PO TABS: 15.0000 mg | ORAL_TABLET | ORAL | 0 refills | Status: DC | PRN

## 2021-12-24 DIAGNOSIS — U071 COVID-19: Secondary | ICD-10-CM | POA: Diagnosis not present

## 2021-12-27 ENCOUNTER — Telehealth: Payer: Self-pay | Admitting: Hematology

## 2021-12-27 NOTE — Telephone Encounter (Signed)
Rescheduled upcoming appointments per patient's wife's request due to patient's ear appointment. Patient's wife is aware of changes. ?

## 2022-01-02 ENCOUNTER — Inpatient Hospital Stay: Payer: No Typology Code available for payment source

## 2022-01-02 ENCOUNTER — Inpatient Hospital Stay: Payer: No Typology Code available for payment source | Admitting: Hematology

## 2022-01-02 DIAGNOSIS — H9212 Otorrhea, left ear: Secondary | ICD-10-CM | POA: Diagnosis not present

## 2022-01-02 DIAGNOSIS — G510 Bell's palsy: Secondary | ICD-10-CM | POA: Diagnosis not present

## 2022-01-02 DIAGNOSIS — C3490 Malignant neoplasm of unspecified part of unspecified bronchus or lung: Secondary | ICD-10-CM | POA: Diagnosis not present

## 2022-01-02 DIAGNOSIS — L308 Other specified dermatitis: Secondary | ICD-10-CM | POA: Diagnosis not present

## 2022-01-02 DIAGNOSIS — Z9221 Personal history of antineoplastic chemotherapy: Secondary | ICD-10-CM | POA: Diagnosis not present

## 2022-01-02 DIAGNOSIS — Z85818 Personal history of malignant neoplasm of other sites of lip, oral cavity, and pharynx: Secondary | ICD-10-CM | POA: Diagnosis not present

## 2022-01-02 DIAGNOSIS — C07 Malignant neoplasm of parotid gland: Secondary | ICD-10-CM | POA: Diagnosis not present

## 2022-01-02 DIAGNOSIS — H60312 Diffuse otitis externa, left ear: Secondary | ICD-10-CM | POA: Diagnosis not present

## 2022-01-02 DIAGNOSIS — C679 Malignant neoplasm of bladder, unspecified: Secondary | ICD-10-CM | POA: Diagnosis not present

## 2022-01-02 DIAGNOSIS — H60392 Other infective otitis externa, left ear: Secondary | ICD-10-CM | POA: Diagnosis not present

## 2022-01-02 DIAGNOSIS — Z923 Personal history of irradiation: Secondary | ICD-10-CM | POA: Diagnosis not present

## 2022-01-03 ENCOUNTER — Other Ambulatory Visit: Payer: Self-pay | Admitting: Registered Nurse

## 2022-01-03 DIAGNOSIS — J449 Chronic obstructive pulmonary disease, unspecified: Secondary | ICD-10-CM

## 2022-01-07 ENCOUNTER — Inpatient Hospital Stay: Payer: No Typology Code available for payment source | Attending: Hematology

## 2022-01-07 ENCOUNTER — Inpatient Hospital Stay (HOSPITAL_BASED_OUTPATIENT_CLINIC_OR_DEPARTMENT_OTHER): Payer: No Typology Code available for payment source | Admitting: Hematology

## 2022-01-07 ENCOUNTER — Inpatient Hospital Stay: Payer: No Typology Code available for payment source

## 2022-01-07 ENCOUNTER — Other Ambulatory Visit: Payer: Self-pay

## 2022-01-07 VITALS — BP 120/59 | HR 89 | Temp 98.7°F | Resp 18 | Wt 127.0 lb

## 2022-01-07 DIAGNOSIS — Z8551 Personal history of malignant neoplasm of bladder: Secondary | ICD-10-CM | POA: Insufficient documentation

## 2022-01-07 DIAGNOSIS — C3490 Malignant neoplasm of unspecified part of unspecified bronchus or lung: Secondary | ICD-10-CM

## 2022-01-07 DIAGNOSIS — G893 Neoplasm related pain (acute) (chronic): Secondary | ICD-10-CM | POA: Insufficient documentation

## 2022-01-07 DIAGNOSIS — Z5112 Encounter for antineoplastic immunotherapy: Secondary | ICD-10-CM

## 2022-01-07 DIAGNOSIS — C3492 Malignant neoplasm of unspecified part of left bronchus or lung: Secondary | ICD-10-CM | POA: Diagnosis not present

## 2022-01-07 DIAGNOSIS — C7951 Secondary malignant neoplasm of bone: Secondary | ICD-10-CM | POA: Insufficient documentation

## 2022-01-07 DIAGNOSIS — C3412 Malignant neoplasm of upper lobe, left bronchus or lung: Secondary | ICD-10-CM | POA: Diagnosis present

## 2022-01-07 DIAGNOSIS — Z7189 Other specified counseling: Secondary | ICD-10-CM

## 2022-01-07 DIAGNOSIS — F17210 Nicotine dependence, cigarettes, uncomplicated: Secondary | ICD-10-CM | POA: Insufficient documentation

## 2022-01-07 LAB — CBC WITH DIFFERENTIAL (CANCER CENTER ONLY)
Abs Immature Granulocytes: 0.03 10*3/uL (ref 0.00–0.07)
Basophils Absolute: 0 10*3/uL (ref 0.0–0.1)
Basophils Relative: 0 %
Eosinophils Absolute: 0.3 10*3/uL (ref 0.0–0.5)
Eosinophils Relative: 3 %
HCT: 35.5 % — ABNORMAL LOW (ref 39.0–52.0)
Hemoglobin: 11.7 g/dL — ABNORMAL LOW (ref 13.0–17.0)
Immature Granulocytes: 0 %
Lymphocytes Relative: 4 %
Lymphs Abs: 0.5 10*3/uL — ABNORMAL LOW (ref 0.7–4.0)
MCH: 31.5 pg (ref 26.0–34.0)
MCHC: 33 g/dL (ref 30.0–36.0)
MCV: 95.7 fL (ref 80.0–100.0)
Monocytes Absolute: 1.1 10*3/uL — ABNORMAL HIGH (ref 0.1–1.0)
Monocytes Relative: 10 %
Neutro Abs: 9.4 10*3/uL — ABNORMAL HIGH (ref 1.7–7.7)
Neutrophils Relative %: 83 %
Platelet Count: 202 10*3/uL (ref 150–400)
RBC: 3.71 MIL/uL — ABNORMAL LOW (ref 4.22–5.81)
RDW: 14.6 % (ref 11.5–15.5)
WBC Count: 11.4 10*3/uL — ABNORMAL HIGH (ref 4.0–10.5)
nRBC: 0 % (ref 0.0–0.2)

## 2022-01-07 LAB — CMP (CANCER CENTER ONLY)
ALT: 16 U/L (ref 0–44)
AST: 20 U/L (ref 15–41)
Albumin: 3.8 g/dL (ref 3.5–5.0)
Alkaline Phosphatase: 56 U/L (ref 38–126)
Anion gap: 3 — ABNORMAL LOW (ref 5–15)
BUN: 17 mg/dL (ref 8–23)
CO2: 29 mmol/L (ref 22–32)
Calcium: 9.1 mg/dL (ref 8.9–10.3)
Chloride: 105 mmol/L (ref 98–111)
Creatinine: 0.77 mg/dL (ref 0.61–1.24)
GFR, Estimated: >60 mL/min (ref >60)
Glucose, Bld: 99 mg/dL (ref 70–99)
Potassium: 4.2 mmol/L (ref 3.5–5.1)
Sodium: 137 mmol/L (ref 135–145)
Total Bilirubin: 0.5 mg/dL (ref 0.3–1.2)
Total Protein: 6.7 g/dL (ref 6.5–8.1)

## 2022-01-07 LAB — TSH: TSH: 1.515 u[IU]/mL (ref 0.350–4.500)

## 2022-01-07 MED ORDER — FAMOTIDINE IN NACL 20-0.9 MG/50ML-% IV SOLN
20.0000 mg | Freq: Once | INTRAVENOUS | Status: AC
  Administered 2022-01-07: 20 mg via INTRAVENOUS
  Filled 2022-01-07: qty 50

## 2022-01-07 MED ORDER — DIPHENHYDRAMINE HCL 50 MG/ML IJ SOLN
25.0000 mg | Freq: Once | INTRAMUSCULAR | Status: AC
  Administered 2022-01-07: 25 mg via INTRAVENOUS
  Filled 2022-01-07: qty 1

## 2022-01-07 MED ORDER — SODIUM CHLORIDE 0.9 % IV SOLN
200.0000 mg | Freq: Once | INTRAVENOUS | Status: AC
  Administered 2022-01-07: 200 mg via INTRAVENOUS
  Filled 2022-01-07: qty 200

## 2022-01-07 MED ORDER — HEPARIN SOD (PORK) LOCK FLUSH 100 UNIT/ML IV SOLN: 500.0000 [IU] | Freq: Once | INTRAVENOUS | Status: DC | PRN

## 2022-01-07 MED ORDER — SODIUM CHLORIDE 0.9 % IV SOLN: Freq: Once | INTRAVENOUS | Status: AC

## 2022-01-07 MED ORDER — SODIUM CHLORIDE 0.9% FLUSH: 10.0000 mL | INTRAVENOUS | Status: DC | PRN

## 2022-01-07 NOTE — Patient Instructions (Signed)
[facility]  Discharge Instructions: ?Thank you for choosing [facility] to provide your oncology and hematology care.  ? ?If you have a lab appointment with the [facility], please go directly to the [facility] and check in at the registration area. ?  ?Wear comfortable clothing and clothing appropriate for easy access to any Portacath or PICC line.  ? ?We strive to give you quality time with your provider. You may need to reschedule your appointment if you arrive late (15 or more minutes).  Arriving late affects you and other patients whose appointments are after yours.  Also, if you miss three or more appointments without notifying the office, you may be dismissed from the clinic at the provider?s discretion.    ?  ?For prescription refill requests, have your pharmacy contact our office and allow 72 hours for refills to be completed.   ? ?Today you received the following chemotherapy and/or immunotherapy agents keytruda    ?  ?To help prevent nausea and vomiting after your treatment, we encourage you to take your nausea medication as directed. ? ?BELOW ARE SYMPTOMS THAT SHOULD BE REPORTED IMMEDIATELY: ?*FEVER GREATER THAN 100.4 F (38 ?C) OR HIGHER ?*CHILLS OR SWEATING ?*NAUSEA AND VOMITING THAT IS NOT CONTROLLED WITH YOUR NAUSEA MEDICATION ?*UNUSUAL SHORTNESS OF BREATH ?*UNUSUAL BRUISING OR BLEEDING ?*URINARY PROBLEMS (pain or burning when urinating, or frequent urination) ?*BOWEL PROBLEMS (unusual diarrhea, constipation, pain near the anus) ?TENDERNESS IN MOUTH AND THROAT WITH OR WITHOUT PRESENCE OF ULCERS (sore throat, sores in mouth, or a toothache) ?UNUSUAL RASH, SWELLING OR PAIN  ?UNUSUAL VAGINAL DISCHARGE OR ITCHING  ? ?Items with * indicate a potential emergency and should be followed up as soon as possible or go to the Emergency Department if any problems should occur. ? ?Please show the CHEMOTHERAPY ALERT CARD or IMMUNOTHERAPY ALERT CARD at check-in to  the Emergency Department and triage nurse. ? ?Should you have questions after your visit or need to cancel or reschedule your appointment, please contact [facility]  Dept: [phone]  and follow the prompts.  Office hours are 8:00 a.m. to 4:30 p.m. Monday - Friday. Please note that voicemails left after 4:00 p.m. may not be returned until the following business day.  We are closed weekends and major holidays. You have access to a nurse at all times for urgent questions. Please call the main number to the clinic Dept: [phone] and follow the prompts. ? ? ?For any non-urgent questions, you may also contact your provider using MyChart. We now offer e-Visits for anyone 18 and older to request care online for non-urgent symptoms. For details visit [email or web address]. ?  ?Also download the MyChart app! Go to the app store, search "MyChart", open the app, select [facility], and log in with your MyChart username and password. ? ?Due to Covid, a mask is required upon entering the hospital/clinic. If you do not have a mask, one will be given to you upon arrival. For doctor visits, patients may have 1 support person aged 40 or older with them. For treatment visits, patients cannot have anyone with them due to current Covid guidelines and our immunocompromised population.  ? ?

## 2022-01-07 NOTE — Progress Notes (Signed)
[facility] Follow up: ?  ? ?Kyle Coss, NP ?[address] ?[place] [address] ? ?DOS .01/07/2022 ? 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 ? ?DIAGNOSIS: Stage IV lung cancer-squamous cell carcinoma ? ?SUMMARY OF ONCOLOGIC HISTORY: ?Oncology History  ?Metastatic cancer ([facility])  ?10/19/2018 Initial Diagnosis  ? Metastatic cancer ([facility]) ? ?  ?11/24/2018 -  Chemotherapy  ? Patient is on Treatment Plan : LUNG NSCLC Carboplatin + Paclitaxel + Pembrolizumab q21d x 4 cycles / Pembrolizumab Maintenance Q21D  ? ?  ?  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 Bone metastases ([facility]) ? ?  ?11/24/2018 -  Chemotherapy  ? The patient had dexamethasone (DECADRON) 4 MG tablet, 1 of 1 cycle, Start date: 11/24/2018, End date: 04/27/2019 ?palonosetron (ALOXI) injection 0.25 mg, 0.25 mg, Intravenous,  Once, 5 of 5 cycles ?Administration: 0.25 mg (11/24/2018), 0.25 mg (12/15/2018), 0.25 mg (01/05/2019), 0.25 mg (01/26/2019), 0.25 mg (02/16/2019) ?pegfilgrastim (NEULASTA ONPRO KIT) injection 6 mg, 6 mg, Subcutaneous, Once, 3 of 3 cycles ?Administration: 6 mg (01/05/2019), 6 mg (01/26/2019), 6 mg (02/16/2019) ?pegfilgrastim-cbqv (UDENYCA) injection 6 mg, 6 mg, Subcutaneous, Once, 2 of 2 cycles ?Administration: 6 mg (11/26/2018), 6 mg (12/17/2018) ?CARBOplatin (PARAPLATIN) 410 mg in sodium chloride 0.9 % 250 mL chemo infusion, 410 mg (108 % of original dose 381.5 mg), Intravenous,  Once, 5 of 5 cycles ?Dose modification:   (original dose 381.5 mg, Cycle 1) ?Administration: 410 mg (11/24/2018), 410 mg (12/15/2018), 410 mg (01/05/2019), 410 mg (01/26/2019), 410 mg (02/16/2019) ?PACLitaxel (TAXOL) 234 mg in sodium chloride 0.9 % 250 mL chemo infusion (> 28m/m2), 135 mg/m2 = 234 mg (100 % of original dose 135 mg/m2), Intravenous,  Once, 5 of 5 cycles ?Dose modification: 150 mg/m2 (original dose 135 mg/m2, Cycle 1, Reason: Provider Judgment),  135 mg/m2 (original dose 135 mg/m2, Cycle 1, Reason: Provider Judgment), 150 mg/m2 (original dose 135 mg/m2, Cycle 2, Reason: Catheter Related Infection) ?Administration: 234 mg (11/24/2018), 258 mg (12/15/2018), 258 mg (01/05/2019), 258 mg (01/26/2019), 258 mg (02/16/2019) ?pembrolizumab (KEYTRUDA) 200 mg in sodium chloride 0.9 % 50 mL chemo infusion, 200 mg, Intravenous, Once, 27 of 29 cycles ?Administration: 200 mg (11/24/2018), 200 mg (12/15/2018), 200 mg (01/05/2019), 200 mg (01/26/2019), 200 mg (02/16/2019), 200 mg (03/09/2019), 200 mg (03/30/2019), 200 mg (04/20/2019), 200 mg (05/11/2019), 200 mg (06/01/2019), 200 mg (06/22/2019), 200 mg (07/13/2019), 200 mg (08/03/2019), 200 mg (09/14/2019), 200 mg (08/24/2019), 200 mg (10/05/2019), 200 mg (10/26/2019), 200 mg (11/16/2019), 200 mg (12/07/2019), 200 mg (12/28/2019), 200 mg (01/18/2020), 200 mg (02/08/2020), 200 mg (02/29/2020), 200 mg (03/21/2020), 200 mg (04/11/2020), 200 mg (05/02/2020), 200 mg (05/24/2020) ?fosaprepitant (EMEND) 150 mg, dexamethasone (DECADRON) 12 mg in sodium chloride 0.9 % 145 mL IVPB, , Intravenous,  Once, 5 of 5 cycles ?Administration:  (11/24/2018),  (12/15/2018),  (01/05/2019),  (01/26/2019),  (02/16/2019) ? ? for chemotherapy treatment.  ? ?  ?Squamous cell lung cancer, left ([facility]  ?11/24/2018 Initial Diagnosis  ? [facility] ? ?  ?11/24/2018 -  Chemotherapy  ? The patient had dexamethasone (DECADRON) 4 MG tablet, 1 of 1 cycle, Start date: 11/24/2018, End date: 04/27/2019 ?palonosetron (ALOXI) injection 0.25 mg, 0.25 mg, Intravenous,  Once, 5 of 5 cycles ?Administration: 0.25 mg (11/24/2018), 0.25 mg (12/15/2018), 0.25 mg (01/05/2019), 0.25 mg (01/26/2019), 0.25 mg (02/16/2019) ?pegfilgrastim (NEULASTA ONPRO KIT) injection 6 mg, 6 mg, Subcutaneous, Once, 3 of 3  cycles ?Administration: 6 mg (01/05/2019), 6 mg (01/26/2019), 6 mg (02/16/2019) ?pegfilgrastim-cbqv (UDENYCA) injection 6 mg, 6 mg, Subcutaneous, Once, 2 of 2 cycles ?Administration: 6 mg (11/26/2018), 6  mg (12/17/2018) ?CARBOplatin (PARAPLATIN) 410 mg in sodium chloride 0.9 % 250 mL chemo infusion, 410 mg (108 % of original dose 381.5 mg), Intravenous,  Once, 5 of 5 cycles ?Dose modification:   (original dose 381.5 mg, Cycle 1) ?Administration: 410 mg (11/24/2018), 410 mg (12/15/2018), 410 mg (01/05/2019), 410 mg (01/26/2019), 410 mg (02/16/2019) ?PACLitaxel (TAXOL) 234 mg in sodium chloride 0.9 % 250 mL chemo infusion (> 13m/m2), 135 mg/m2 = 234 mg (100 % of original dose 135 mg/m2), Intravenous,  Once, 5 of 5 cycles ?Dose modification: 150 mg/m2 (original dose 135 mg/m2, Cycle 1, Reason: Provider Judgment), 135 mg/m2 (original dose 135 mg/m2, Cycle 1, Reason: Provider Judgment), 150 mg/m2 (original dose 135 mg/m2, Cycle 2, Reason: Catheter Related Infection) ?Administration: 234 mg (11/24/2018), 258 mg (12/15/2018), 258 mg (01/05/2019), 258 mg (01/26/2019), 258 mg (02/16/2019) ?pembrolizumab (KEYTRUDA) 200 mg in sodium chloride 0.9 % 50 mL chemo infusion, 200 mg, Intravenous, Once, 27 of 29 cycles ?Administration: 200 mg (11/24/2018), 200 mg (12/15/2018), 200 mg (01/05/2019), 200 mg (01/26/2019), 200 mg (02/16/2019), 200 mg (03/09/2019), 200 mg (03/30/2019), 200 mg (04/20/2019), 200 mg (05/11/2019), 200 mg (06/01/2019), 200 mg (06/22/2019), 200 mg (07/13/2019), 200 mg (08/03/2019), 200 mg (09/14/2019), 200 mg (08/24/2019), 200 mg (10/05/2019), 200 mg (10/26/2019), 200 mg (11/16/2019), 200 mg (12/07/2019), 200 mg (12/28/2019), 200 mg (01/18/2020), 200 mg (02/08/2020), 200 mg (02/29/2020), 200 mg (03/21/2020), 200 mg (04/11/2020), 200 mg (05/02/2020), 200 mg (05/24/2020) ?fosaprepitant (EMEND) 150 mg, dexamethasone (DECADRON) 12 mg in sodium chloride 0.9 % 145 mL IVPB, , Intravenous,  Once, 5 of 5 cycles ?Administration:  (11/24/2018),  (12/15/2018),  (01/05/2019),  (01/26/2019),  (02/16/2019) ? ? for chemotherapy treatment.  ? ?  ? ? ?CURRENT THERAPY: Keytruda every 3 weeks ? ?INTERVAL HISTORY: ? ?Kyle Foster who is here for continued evaluation  and management of his stage IV lung squamous cell carcinoma. ?He notes no new chest pain or shortness of breath.  No new back pain. 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 ? ?Over the last few months he has had increased left ear discharge pain and swelling.  He was seen by Dr. RConstance [facility]and [facility]  He had a CT temporal bones which showed "1.  Soft tissue fills the left external auditory canal with multifocal osseous irregularity and fragmentation. Primary differential considerations include infection, external auditory canal cholesteatoma, external auditory canal squamous cell carcinoma, and/or treatment effect related to prior irradiation (e.g., osteonecrosis).  ?2.  Soft tissue within the right external auditory canal near the junction of the osseous and cartilaginous portions with possible associated osseous erosion. Suggest correlation with direct inspection for possible small right external auditory canal cholesteatoma versus squamous cell carcinoma. " ? ?He reports that he has had biopsy on the left ear and the results of this are currently pending.  He has had wound cultures which grew out Dermobacter hominis. ? ?Patient reports no other new rashes.  No diarrhea.  No other toxicities from his current pembrolizumab immunotherapy. ? ?He notes that  he did follow-up with his dentist and most of his dental healing has been completed and his dentist was suggesting that he could potentially go back on Xgeva soon.   ?Labs done today were reviewed with him in detail ? ? ?Patient Active Problem List  ? Diagnosis Date Noted  ? Persistent cough for 3 weeks or longer 07/08/2021  ? Left ear pain 07/08/2021   ? Recurrent productive cough 06/28/2021  ? Left lower lobe pneumonia 02/20/2020  ? 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 Aspiration pneumonia of left lower lobe due to gastric secretions ([facility]) 06/13/2

## 2022-01-13 ENCOUNTER — Telehealth: Payer: Self-pay | Admitting: Hematology

## 2022-01-13 DIAGNOSIS — C07 Malignant neoplasm of parotid gland: Secondary | ICD-10-CM | POA: Diagnosis not present

## 2022-01-13 NOTE — Telephone Encounter (Signed)
Left message with follow-up appointment per 5/2 los. ?

## 2022-01-16 ENCOUNTER — Other Ambulatory Visit: Payer: Self-pay | Admitting: Hematology

## 2022-01-17 ENCOUNTER — Telehealth: Payer: Self-pay | Admitting: Hematology

## 2022-01-17 NOTE — Telephone Encounter (Signed)
Left message with follow-up appointments per 5/2 los. ?

## 2022-01-20 ENCOUNTER — Other Ambulatory Visit: Payer: Self-pay | Admitting: *Deleted

## 2022-01-20 DIAGNOSIS — C3492 Malignant neoplasm of unspecified part of left bronchus or lung: Secondary | ICD-10-CM

## 2022-01-20 NOTE — Telephone Encounter (Signed)
Patient called - requested refill of fentanyl and morphine ?Refills requested routed to Dr. [name] ?

## 2022-01-21 DIAGNOSIS — N402 Nodular prostate without lower urinary tract symptoms: Secondary | ICD-10-CM | POA: Diagnosis not present

## 2022-01-22 ENCOUNTER — Inpatient Hospital Stay: Payer: No Typology Code available for payment source

## 2022-01-23 ENCOUNTER — Encounter: Payer: Self-pay | Admitting: Hematology

## 2022-01-23 MED ORDER — MORPHINE SULFATE 15 MG PO TABS: 15.0000 mg | ORAL_TABLET | ORAL | 0 refills | Status: DC | PRN

## 2022-01-23 MED ORDER — FENTANYL 50 MCG/HR TD PT72: 1.0000 | MEDICATED_PATCH | TRANSDERMAL | 0 refills | Status: DC

## 2022-01-29 ENCOUNTER — Inpatient Hospital Stay: Payer: No Typology Code available for payment source

## 2022-01-29 ENCOUNTER — Other Ambulatory Visit: Payer: Self-pay

## 2022-01-29 VITALS — BP 99/53 | HR 81 | Temp 98.3°F | Resp 18 | Ht 70.0 in | Wt 124.8 lb

## 2022-01-29 DIAGNOSIS — Z5112 Encounter for antineoplastic immunotherapy: Secondary | ICD-10-CM | POA: Diagnosis not present

## 2022-01-29 DIAGNOSIS — H7192 Unspecified cholesteatoma, left ear: Secondary | ICD-10-CM | POA: Diagnosis not present

## 2022-01-29 DIAGNOSIS — H60312 Diffuse otitis externa, left ear: Secondary | ICD-10-CM | POA: Diagnosis not present

## 2022-01-29 DIAGNOSIS — C3492 Malignant neoplasm of unspecified part of left bronchus or lung: Secondary | ICD-10-CM

## 2022-01-29 DIAGNOSIS — C3490 Malignant neoplasm of unspecified part of unspecified bronchus or lung: Secondary | ICD-10-CM

## 2022-01-29 DIAGNOSIS — Z7189 Other specified counseling: Secondary | ICD-10-CM

## 2022-01-29 DIAGNOSIS — Z95828 Presence of other vascular implants and grafts: Secondary | ICD-10-CM

## 2022-01-29 DIAGNOSIS — C7951 Secondary malignant neoplasm of bone: Secondary | ICD-10-CM

## 2022-01-29 DIAGNOSIS — Z792 Long term (current) use of antibiotics: Secondary | ICD-10-CM | POA: Diagnosis not present

## 2022-01-29 LAB — CBC WITH DIFFERENTIAL (CANCER CENTER ONLY)
Abs Immature Granulocytes: 0.02 10*3/uL (ref 0.00–0.07)
Basophils Absolute: 0 10*3/uL (ref 0.0–0.1)
Basophils Relative: 0 %
Eosinophils Absolute: 0.2 10*3/uL (ref 0.0–0.5)
Eosinophils Relative: 3 %
HCT: 33.2 % — ABNORMAL LOW (ref 39.0–52.0)
Hemoglobin: 11.3 g/dL — ABNORMAL LOW (ref 13.0–17.0)
Immature Granulocytes: 0 %
Lymphocytes Relative: 8 %
Lymphs Abs: 0.6 10*3/uL — ABNORMAL LOW (ref 0.7–4.0)
MCH: 32.2 pg (ref 26.0–34.0)
MCHC: 34 g/dL (ref 30.0–36.0)
MCV: 94.6 fL (ref 80.0–100.0)
Monocytes Absolute: 0.7 10*3/uL (ref 0.1–1.0)
Monocytes Relative: 9 %
Neutro Abs: 6.7 10*3/uL (ref 1.7–7.7)
Neutrophils Relative %: 80 %
Platelet Count: 216 10*3/uL (ref 150–400)
RBC: 3.51 MIL/uL — ABNORMAL LOW (ref 4.22–5.81)
RDW: 15 % (ref 11.5–15.5)
WBC Count: 8.3 10*3/uL (ref 4.0–10.5)
nRBC: 0 % (ref 0.0–0.2)

## 2022-01-29 LAB — CMP (CANCER CENTER ONLY)
ALT: 17 U/L (ref 0–44)
AST: 24 U/L (ref 15–41)
Albumin: 3.3 g/dL — ABNORMAL LOW (ref 3.5–5.0)
Alkaline Phosphatase: 53 U/L (ref 38–126)
Anion gap: 7 (ref 5–15)
BUN: 18 mg/dL (ref 8–23)
CO2: 29 mmol/L (ref 22–32)
Calcium: 9 mg/dL (ref 8.9–10.3)
Chloride: 105 mmol/L (ref 98–111)
Creatinine: 0.96 mg/dL (ref 0.61–1.24)
GFR, Estimated: >60 mL/min (ref >60)
Glucose, Bld: 131 mg/dL — ABNORMAL HIGH (ref 70–99)
Potassium: 4 mmol/L (ref 3.5–5.1)
Sodium: 141 mmol/L (ref 135–145)
Total Bilirubin: 0.6 mg/dL (ref 0.3–1.2)
Total Protein: 6.9 g/dL (ref 6.5–8.1)

## 2022-01-29 MED ORDER — FAMOTIDINE IN NACL 20-0.9 MG/50ML-% IV SOLN
20.0000 mg | Freq: Once | INTRAVENOUS | Status: AC
  Administered 2022-01-29: 20 mg via INTRAVENOUS
  Filled 2022-01-29: qty 50

## 2022-01-29 MED ORDER — SODIUM CHLORIDE 0.9% FLUSH
10.0000 mL | INTRAVENOUS | Status: DC | PRN
  Administered 2022-01-29: 10 mL

## 2022-01-29 MED ORDER — SODIUM CHLORIDE 0.9 % IV SOLN: Freq: Once | INTRAVENOUS | Status: AC

## 2022-01-29 MED ORDER — SODIUM CHLORIDE 0.9% FLUSH
10.0000 mL | INTRAVENOUS | Status: AC | PRN
  Administered 2022-01-29: 10 mL

## 2022-01-29 MED ORDER — HEPARIN SOD (PORK) LOCK FLUSH 100 UNIT/ML IV SOLN
500.0000 [IU] | Freq: Once | INTRAVENOUS | Status: AC | PRN
  Administered 2022-01-29: 500 [IU]

## 2022-01-29 MED ORDER — DIPHENHYDRAMINE HCL 50 MG/ML IJ SOLN
25.0000 mg | Freq: Once | INTRAMUSCULAR | Status: AC
  Administered 2022-01-29: 25 mg via INTRAVENOUS
  Filled 2022-01-29: qty 1

## 2022-01-29 MED ORDER — SODIUM CHLORIDE 0.9 % IV SOLN
200.0000 mg | Freq: Once | INTRAVENOUS | Status: AC
  Administered 2022-01-29: 200 mg via INTRAVENOUS
  Filled 2022-01-29: qty 200

## 2022-01-29 NOTE — Patient Instructions (Signed)
[facility]  Discharge Instructions: Thank you for choosing [facility] to provide your oncology and hematology care.   If you have a lab appointment with the [facility], please go directly to the [facility] and check in at the registration area.   Wear comfortable clothing and clothing appropriate for easy access to any Portacath or PICC line.   We strive to give you quality time with your provider. You may need to reschedule your appointment if you arrive late (15 or more minutes).  Arriving late affects you and other patients whose appointments are after yours.  Also, if you miss three or more appointments without notifying the office, you may be dismissed from the clinic at the provider's discretion.      For prescription refill requests, have your pharmacy contact our office and allow 72 hours for refills to be completed.    Today you received the following chemotherapy and/or immunotherapy agents Beryle Flock      To help prevent nausea and vomiting after your treatment, we encourage you to take your nausea medication as directed.  BELOW ARE SYMPTOMS THAT SHOULD BE REPORTED IMMEDIATELY: *FEVER GREATER THAN 100.4 F (38 C) OR HIGHER *CHILLS OR SWEATING *NAUSEA AND VOMITING THAT IS NOT CONTROLLED WITH YOUR NAUSEA MEDICATION *UNUSUAL SHORTNESS OF BREATH *UNUSUAL BRUISING OR BLEEDING *URINARY PROBLEMS (pain or burning when urinating, or frequent urination) *BOWEL PROBLEMS (unusual diarrhea, constipation, pain near the anus) TENDERNESS IN MOUTH AND THROAT WITH OR WITHOUT PRESENCE OF ULCERS (sore throat, sores in mouth, or a toothache) UNUSUAL RASH, SWELLING OR PAIN  UNUSUAL VAGINAL DISCHARGE OR ITCHING   Items with * indicate a potential emergency and should be followed up as soon as possible or go to the Emergency Department if any problems should occur.  Please show the CHEMOTHERAPY ALERT CARD or IMMUNOTHERAPY ALERT CARD at check-in to  the Emergency Department and triage nurse.  Should you have questions after your visit or need to cancel or reschedule your appointment, please contact [facility]  Dept: [phone]  and follow the prompts.  Office hours are 8:00 a.m. to 4:30 p.m. Monday - Friday. Please note that voicemails left after 4:00 p.m. may not be returned until the following business day.  We are closed weekends and major holidays. You have access to a nurse at all times for urgent questions. Please call the main number to the clinic Dept: [phone] and follow the prompts.   For any non-urgent questions, you may also contact your provider using MyChart. We now offer e-Visits for anyone 57 and older to request care online for non-urgent symptoms. For details visit [email or web address].   Also download the MyChart app! Go to the app store, search "MyChart", open the app, select [facility], and log in with your MyChart username and password.  Due to Covid, a mask is required upon entering the hospital/clinic. If you do not have a mask, one will be given to you upon arrival. For doctor visits, patients may have 1 support person aged 87 or older with them. For treatment visits, patients cannot have anyone with them due to current Covid guidelines and our immunocompromised population.

## 2022-01-30 LAB — TSH: TSH: 1.189 u[IU]/mL (ref 0.350–4.500)

## 2022-01-31 ENCOUNTER — Telehealth: Payer: Self-pay

## 2022-01-31 NOTE — Telephone Encounter (Signed)
   Primary Cardiologist: Quay Burow, MD  Chart reviewed as part of pre-operative protocol coverage. Given past medical history and time since last visit, based on ACC/AHA guidelines, Kyle Foster would be at acceptable risk for the planned procedure without further cardiovascular testing.   His Plavix may be held for 5 to 7 days prior to his procedure.  Please resume as soon as hemostasis is achieved.  I will route this recommendation to the requesting party via Epic fax function and remove from pre-op pool.  Please call with questions.  Jossie Ng. Berta Denson NP-C    01/31/2022, 1:21 PM [facility] [place] [address] Office [phone] Fax [phone]

## 2022-01-31 NOTE — Telephone Encounter (Signed)
   Pre-operative Risk Assessment    Patient Name: Kyle Foster  DOB: 11-01-1945 MRN: [id number]      Request for Surgical Clearance    Procedure:   Prostate Biopsy   Date of Surgery:  Clearance 03/17/22                                 Surgeon:  Dr. [name], M.D. Surgeon's Group or Practice Name:  [facility]  Phone number:  [phone] Fax number:  [phone]   Type of Clearance Requested:   - Pharmacy:  Hold Clopidogrel (Plavix) 5 days prior surgery    Type of Anesthesia:  Not Indicated   Additional requests/questions:    Leanor Kail   01/31/2022, 9:39 AM

## 2022-01-31 NOTE — Telephone Encounter (Signed)
Arnette Norris. Spruce 76 year old is requesting preoperative cardiac evaluation for prostate biopsy.  He was last seen in the clinic on 12/03/2021.  He continued to do well at that time and denied chest pain or shortness of breath.  His PMH includes coronary artery disease status post PCI 10/09 with bare-metal stents to his circumflex and marginal branch bifurcation.  He was also noted to have 70% stenosis of his RCA.  His PMH also includes hyperlipidemia, tobacco abuse, RBBB, and hypertension.   May his Plavix be held prior to his procedure?  Please direct your response to [facility] preop pool.  Thank you for your help.  Jossie Ng. Ebonye Reade NP-C    01/31/2022, 10:16 AM [place] [address] Office [phone] Fax [phone]

## 2022-01-31 NOTE — Telephone Encounter (Signed)
OK to hold clopidogrel for 5-7 days

## 2022-02-17 DIAGNOSIS — H60312 Diffuse otitis externa, left ear: Secondary | ICD-10-CM | POA: Diagnosis not present

## 2022-02-17 DIAGNOSIS — M272 Inflammatory conditions of jaws: Secondary | ICD-10-CM | POA: Diagnosis not present

## 2022-02-17 DIAGNOSIS — R918 Other nonspecific abnormal finding of lung field: Secondary | ICD-10-CM | POA: Diagnosis not present

## 2022-02-17 DIAGNOSIS — M8738 Other secondary osteonecrosis, other site: Secondary | ICD-10-CM | POA: Diagnosis not present

## 2022-02-17 DIAGNOSIS — Y842 Radiological procedure and radiotherapy as the cause of abnormal reaction of the patient, or of later complication, without mention of misadventure at the time of the procedure: Secondary | ICD-10-CM | POA: Diagnosis not present

## 2022-02-19 ENCOUNTER — Inpatient Hospital Stay: Payer: No Typology Code available for payment source

## 2022-02-19 ENCOUNTER — Inpatient Hospital Stay: Payer: No Typology Code available for payment source | Attending: Hematology

## 2022-02-19 ENCOUNTER — Inpatient Hospital Stay (HOSPITAL_BASED_OUTPATIENT_CLINIC_OR_DEPARTMENT_OTHER): Payer: No Typology Code available for payment source | Admitting: Hematology

## 2022-02-19 ENCOUNTER — Other Ambulatory Visit: Payer: Self-pay

## 2022-02-19 VITALS — BP 99/55 | HR 74 | Temp 97.7°F | Resp 18 | Ht 70.0 in | Wt 127.5 lb

## 2022-02-19 VITALS — BP 115/57 | HR 58 | Resp 17

## 2022-02-19 DIAGNOSIS — Z923 Personal history of irradiation: Secondary | ICD-10-CM | POA: Insufficient documentation

## 2022-02-19 DIAGNOSIS — Z7189 Other specified counseling: Secondary | ICD-10-CM

## 2022-02-19 DIAGNOSIS — C3492 Malignant neoplasm of unspecified part of left bronchus or lung: Secondary | ICD-10-CM | POA: Diagnosis not present

## 2022-02-19 DIAGNOSIS — Z85858 Personal history of malignant neoplasm of other endocrine glands: Secondary | ICD-10-CM | POA: Diagnosis not present

## 2022-02-19 DIAGNOSIS — Z95828 Presence of other vascular implants and grafts: Secondary | ICD-10-CM

## 2022-02-19 DIAGNOSIS — C3412 Malignant neoplasm of upper lobe, left bronchus or lung: Secondary | ICD-10-CM | POA: Diagnosis present

## 2022-02-19 DIAGNOSIS — Z87891 Personal history of nicotine dependence: Secondary | ICD-10-CM | POA: Diagnosis not present

## 2022-02-19 DIAGNOSIS — Z9221 Personal history of antineoplastic chemotherapy: Secondary | ICD-10-CM | POA: Diagnosis not present

## 2022-02-19 DIAGNOSIS — Z5112 Encounter for antineoplastic immunotherapy: Secondary | ICD-10-CM | POA: Insufficient documentation

## 2022-02-19 DIAGNOSIS — C7951 Secondary malignant neoplasm of bone: Secondary | ICD-10-CM

## 2022-02-19 DIAGNOSIS — C3490 Malignant neoplasm of unspecified part of unspecified bronchus or lung: Secondary | ICD-10-CM

## 2022-02-19 DIAGNOSIS — I444 Left anterior fascicular block: Secondary | ICD-10-CM | POA: Diagnosis not present

## 2022-02-19 DIAGNOSIS — Z8551 Personal history of malignant neoplasm of bladder: Secondary | ICD-10-CM | POA: Insufficient documentation

## 2022-02-19 DIAGNOSIS — G893 Neoplasm related pain (acute) (chronic): Secondary | ICD-10-CM | POA: Diagnosis not present

## 2022-02-19 DIAGNOSIS — I4510 Unspecified right bundle-branch block: Secondary | ICD-10-CM | POA: Diagnosis not present

## 2022-02-19 DIAGNOSIS — I452 Bifascicular block: Secondary | ICD-10-CM | POA: Diagnosis not present

## 2022-02-19 LAB — CMP (CANCER CENTER ONLY)
ALT: 21 U/L (ref 0–44)
AST: 33 U/L (ref 15–41)
Albumin: 3.8 g/dL (ref 3.5–5.0)
Alkaline Phosphatase: 56 U/L (ref 38–126)
Anion gap: 3 — ABNORMAL LOW (ref 5–15)
BUN: 19 mg/dL (ref 8–23)
CO2: 31 mmol/L (ref 22–32)
Calcium: 9.7 mg/dL (ref 8.9–10.3)
Chloride: 105 mmol/L (ref 98–111)
Creatinine: 0.92 mg/dL (ref 0.61–1.24)
GFR, Estimated: >60 mL/min (ref >60)
Glucose, Bld: 94 mg/dL (ref 70–99)
Potassium: 4.4 mmol/L (ref 3.5–5.1)
Sodium: 139 mmol/L (ref 135–145)
Total Bilirubin: 0.7 mg/dL (ref 0.3–1.2)
Total Protein: 6.8 g/dL (ref 6.5–8.1)

## 2022-02-19 LAB — CBC WITH DIFFERENTIAL (CANCER CENTER ONLY)
Abs Immature Granulocytes: 0.02 10*3/uL (ref 0.00–0.07)
Basophils Absolute: 0 10*3/uL (ref 0.0–0.1)
Basophils Relative: 0 %
Eosinophils Absolute: 0.1 10*3/uL (ref 0.0–0.5)
Eosinophils Relative: 1 %
HCT: 35.5 % — ABNORMAL LOW (ref 39.0–52.0)
Hemoglobin: 11.8 g/dL — ABNORMAL LOW (ref 13.0–17.0)
Immature Granulocytes: 0 %
Lymphocytes Relative: 7 %
Lymphs Abs: 0.6 10*3/uL — ABNORMAL LOW (ref 0.7–4.0)
MCH: 32.1 pg (ref 26.0–34.0)
MCHC: 33.2 g/dL (ref 30.0–36.0)
MCV: 96.5 fL (ref 80.0–100.0)
Monocytes Absolute: 0.7 10*3/uL (ref 0.1–1.0)
Monocytes Relative: 8 %
Neutro Abs: 6.8 10*3/uL (ref 1.7–7.7)
Neutrophils Relative %: 84 %
Platelet Count: 180 10*3/uL (ref 150–400)
RBC: 3.68 MIL/uL — ABNORMAL LOW (ref 4.22–5.81)
RDW: 15.5 % (ref 11.5–15.5)
WBC Count: 8.2 10*3/uL (ref 4.0–10.5)
nRBC: 0 % (ref 0.0–0.2)

## 2022-02-19 LAB — TSH: TSH: 1.266 u[IU]/mL (ref 0.350–4.500)

## 2022-02-19 MED ORDER — FAMOTIDINE IN NACL 20-0.9 MG/50ML-% IV SOLN
20.0000 mg | Freq: Once | INTRAVENOUS | Status: AC
  Administered 2022-02-19: 20 mg via INTRAVENOUS
  Filled 2022-02-19: qty 50

## 2022-02-19 MED ORDER — SODIUM CHLORIDE 0.9% FLUSH
10.0000 mL | INTRAVENOUS | Status: AC | PRN
  Administered 2022-02-19: 10 mL

## 2022-02-19 MED ORDER — SODIUM CHLORIDE 0.9 % IV SOLN
200.0000 mg | Freq: Once | INTRAVENOUS | Status: AC
  Administered 2022-02-19: 200 mg via INTRAVENOUS
  Filled 2022-02-19: qty 200

## 2022-02-19 MED ORDER — SODIUM CHLORIDE 0.9 % IV SOLN: Freq: Once | INTRAVENOUS | Status: AC

## 2022-02-19 MED ORDER — SODIUM CHLORIDE 0.9% FLUSH
10.0000 mL | INTRAVENOUS | Status: DC | PRN
  Administered 2022-02-19: 10 mL

## 2022-02-19 MED ORDER — DIPHENHYDRAMINE HCL 50 MG/ML IJ SOLN
25.0000 mg | Freq: Once | INTRAMUSCULAR | Status: AC
  Administered 2022-02-19: 25 mg via INTRAVENOUS
  Filled 2022-02-19: qty 1

## 2022-02-19 MED ORDER — HEPARIN SOD (PORK) LOCK FLUSH 100 UNIT/ML IV SOLN
500.0000 [IU] | Freq: Once | INTRAVENOUS | Status: AC | PRN
  Administered 2022-02-19: 500 [IU]

## 2022-02-19 NOTE — Progress Notes (Signed)
HEMATOLOGY/ONCOLOGY CLINIC NOTE   [name], NP [address] [place] [address]  DOS 02/19/2022  CC: Follow-up for continued evaluation and management of stage IV squamous cell lung carcinoma   DIAGNOSIS: Stage IV lung cancer-squamous cell carcinoma  SUMMARY OF ONCOLOGIC HISTORY: Oncology History  Metastatic cancer ([place])  10/19/2018 Initial Diagnosis   Metastatic cancer ([place])   11/24/2018 -  Chemotherapy   Patient is on Treatment Plan : LUNG NSCLC Carboplatin + Paclitaxel + Pembrolizumab q21d x 4 cycles / Pembrolizumab Maintenance Q21D     Malignant neoplasm metastatic to bone ([place])  10/19/2018 Initial Diagnosis   Bone metastases ([place])   11/24/2018 -  Chemotherapy   The patient had dexamethasone (DECADRON) 4 MG tablet, 1 of 1 cycle, Start date: 11/24/2018, End date: 04/27/2019 palonosetron (ALOXI) injection 0.25 mg, 0.25 mg, Intravenous,  Once, 5 of 5 cycles Administration: 0.25 mg (11/24/2018), 0.25 mg (12/15/2018), 0.25 mg (01/05/2019), 0.25 mg (01/26/2019), 0.25 mg (02/16/2019) pegfilgrastim (NEULASTA ONPRO KIT) injection 6 mg, 6 mg, Subcutaneous, Once, 3 of 3 cycles Administration: 6 mg (01/05/2019), 6 mg (01/26/2019), 6 mg (02/16/2019) pegfilgrastim-cbqv (UDENYCA) injection 6 mg, 6 mg, Subcutaneous, Once, 2 of 2 cycles Administration: 6 mg (11/26/2018), 6 mg (12/17/2018) CARBOplatin (PARAPLATIN) 410 mg in sodium chloride 0.9 % 250 mL chemo infusion, 410 mg (108 % of original dose 381.5 mg), Intravenous,  Once, 5 of 5 cycles Dose modification:   (original dose 381.5 mg, Cycle 1) Administration: 410 mg (11/24/2018), 410 mg (12/15/2018), 410 mg (01/05/2019), 410 mg (01/26/2019), 410 mg (02/16/2019) PACLitaxel (TAXOL) 234 mg in sodium chloride 0.9 % 250 mL chemo infusion (> 9m/m2), 135 mg/m2 = 234 mg (100 % of original dose 135 mg/m2), Intravenous,  Once, 5 of 5 cycles Dose modification: 150 mg/m2 (original dose 135 mg/m2, Cycle 1, Reason: Provider Judgment), 135 mg/m2 (original  dose 135 mg/m2, Cycle 1, Reason: Provider Judgment), 150 mg/m2 (original dose 135 mg/m2, Cycle 2, Reason: Catheter Related Infection) Administration: 234 mg (11/24/2018), 258 mg (12/15/2018), 258 mg (01/05/2019), 258 mg (01/26/2019), 258 mg (02/16/2019) pembrolizumab (KEYTRUDA) 200 mg in sodium chloride 0.9 % 50 mL chemo infusion, 200 mg, Intravenous, Once, 27 of 29 cycles Administration: 200 mg (11/24/2018), 200 mg (12/15/2018), 200 mg (01/05/2019), 200 mg (01/26/2019), 200 mg (02/16/2019), 200 mg (03/09/2019), 200 mg (03/30/2019), 200 mg (04/20/2019), 200 mg (05/11/2019), 200 mg (06/01/2019), 200 mg (06/22/2019), 200 mg (07/13/2019), 200 mg (08/03/2019), 200 mg (09/14/2019), 200 mg (08/24/2019), 200 mg (10/05/2019), 200 mg (10/26/2019), 200 mg (11/16/2019), 200 mg (12/07/2019), 200 mg (12/28/2019), 200 mg (01/18/2020), 200 mg (02/08/2020), 200 mg (02/29/2020), 200 mg (03/21/2020), 200 mg (04/11/2020), 200 mg (05/02/2020), 200 mg (05/24/2020) fosaprepitant (EMEND) 150 mg, dexamethasone (DECADRON) 12 mg in sodium chloride 0.9 % 145 mL IVPB, , Intravenous,  Once, 5 of 5 cycles Administration:  (11/24/2018),  (12/15/2018),  (01/05/2019),  (01/26/2019),  (02/16/2019)  for chemotherapy treatment.    Squamous cell lung cancer, left ([place]  11/24/2018 Initial Diagnosis   [place]   11/24/2018 -  Chemotherapy   The patient had dexamethasone (DECADRON) 4 MG tablet, 1 of 1 cycle, Start date: 11/24/2018, End date: 04/27/2019 palonosetron (ALOXI) injection 0.25 mg, 0.25 mg, Intravenous,  Once, 5 of 5 cycles Administration: 0.25 mg (11/24/2018), 0.25 mg (12/15/2018), 0.25 mg (01/05/2019), 0.25 mg (01/26/2019), 0.25 mg (02/16/2019) pegfilgrastim (NEULASTA ONPRO KIT) injection 6 mg, 6 mg, Subcutaneous, Once, 3 of 3 cycles Administration: 6 mg (01/05/2019), 6 mg (01/26/2019), 6 mg (  02/16/2019) pegfilgrastim-cbqv (UDENYCA) injection 6 mg, 6 mg, Subcutaneous, Once, 2 of 2 cycles Administration: 6 mg (11/26/2018), 6 mg  (12/17/2018) CARBOplatin (PARAPLATIN) 410 mg in sodium chloride 0.9 % 250 mL chemo infusion, 410 mg (108 % of original dose 381.5 mg), Intravenous,  Once, 5 of 5 cycles Dose modification:   (original dose 381.5 mg, Cycle 1) Administration: 410 mg (11/24/2018), 410 mg (12/15/2018), 410 mg (01/05/2019), 410 mg (01/26/2019), 410 mg (02/16/2019) PACLitaxel (TAXOL) 234 mg in sodium chloride 0.9 % 250 mL chemo infusion (> $RemoveBef'80mg'XAEQIACmxi$ /m2), 135 mg/m2 = 234 mg (100 % of original dose 135 mg/m2), Intravenous,  Once, 5 of 5 cycles Dose modification: 150 mg/m2 (original dose 135 mg/m2, Cycle 1, Reason: Provider Judgment), 135 mg/m2 (original dose 135 mg/m2, Cycle 1, Reason: Provider Judgment), 150 mg/m2 (original dose 135 mg/m2, Cycle 2, Reason: Catheter Related Infection) Administration: 234 mg (11/24/2018), 258 mg (12/15/2018), 258 mg (01/05/2019), 258 mg (01/26/2019), 258 mg (02/16/2019) pembrolizumab (KEYTRUDA) 200 mg in sodium chloride 0.9 % 50 mL chemo infusion, 200 mg, Intravenous, Once, 27 of 29 cycles Administration: 200 mg (11/24/2018), 200 mg (12/15/2018), 200 mg (01/05/2019), 200 mg (01/26/2019), 200 mg (02/16/2019), 200 mg (03/09/2019), 200 mg (03/30/2019), 200 mg (04/20/2019), 200 mg (05/11/2019), 200 mg (06/01/2019), 200 mg (06/22/2019), 200 mg (07/13/2019), 200 mg (08/03/2019), 200 mg (09/14/2019), 200 mg (08/24/2019), 200 mg (10/05/2019), 200 mg (10/26/2019), 200 mg (11/16/2019), 200 mg (12/07/2019), 200 mg (12/28/2019), 200 mg (01/18/2020), 200 mg (02/08/2020), 200 mg (02/29/2020), 200 mg (03/21/2020), 200 mg (04/11/2020), 200 mg (05/02/2020), 200 mg (05/24/2020) fosaprepitant (EMEND) 150 mg, dexamethasone (DECADRON) 12 mg in sodium chloride 0.9 % 145 mL IVPB, , Intravenous,  Once, 5 of 5 cycles Administration:  (11/24/2018),  (12/15/2018),  (01/05/2019),  (01/26/2019),  (02/16/2019)  for chemotherapy treatment.      CURRENT THERAPY: Keytruda every 3 weeks  INTERVAL HISTORY:  Kyle Foster 76 y.o. male who is here for continued evaluation and  management of his stage IV lung squamous cell carcinoma. He reports He is doing well with no new symptoms or concerns.  No new back pain. No new or unexpected weight loss. No SOB or chest pain. No abnormal bowel habits or diarrhea. No other new or acute focal symptoms.  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 He notes constant aching but not particularly painful. We discussed we may have to hold the [facility] while on hyperbaric oxygen to potentially reduce lung toxicity from hyperbaric oxygen.  He notes discomfort in jaw but he is eating well.  No other toxicities from his current pembrolizumab immunotherapy.  Dr. Eda Keys is seeing him for suspected prostate cancer. He has an upcoming biopsy scheduled for 03/17/2022.  Labs done today were reviewed with him in detail..  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   H/O agent Orange exposure 03/22/2019   Centrilobular emphysema ([place]) 03/21/2019   Squamous cell lung cancer, left ([place]) 11/24/2018   Counseling regarding advance care planning and goals of care 11/24/2018   Metastatic cancer ([place])    Malignant neoplasm of upper lobe of left lung ([place])     Malignant neoplasm metastatic to bone [facility])    Severe protein-calorie malnutrition ([place]) 10/15/2018   Palliative care by specialist    Goals of care, counseling/discussion    Cancer associated pain 10/14/2018   CAD S/P percutaneous coronary angioplasty 07/13/2013   Essential hypertension 07/13/2013   Mixed hyperlipidemia 07/13/2013   Tobacco abuse 07/13/2013   Coronary artery disease 07/13/2013   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 immunotherapy    SURGICAL HISTORY: Past Surgical History:  Procedure Laterality Date   CATARACT EXTRACTION W/ INTRAOCULAR LENS IMPLANT Right 12/2007   CORONARY ANGIOPLASTY WITH STENT PLACEMENT  06/2008   "3 stents" (06/14/2018)   CYSTOSCOPY W/ RETROGRADES  09/22/2011   Procedure: CYSTOSCOPY WITH RETROGRADE PYELOGRAM;  Surgeon: Bernestine Amass, MD;  Location: [facility];  Service: Urology;  Laterality: Left;  Cystoscopy left Retrograde Pyelogram      (c-arm)    CYSTOSCOPY WITH BIOPSY  09/22/2011   Procedure: CYSTOSCOPY WITH BIOPSY;  Surgeon: Bernestine Amass, MD;  Location: [facility];  Service: Urology;  Laterality: N/A;   Biopsy   CYSTOSCOPY WITH BIOPSY N/A 04/19/2021   Procedure: CYSTOSCOPY WITH  BLADDER  BIOPSY WITH FULGERATION;  Surgeon: [name], MD;  Location: [facility];  Service: Urology;  Laterality: N/A;   EXCISIONAL HEMORRHOIDECTOMY  ~ 2006   EYE SURGERY Left 04/2011   "reconstruction; gold weight in eye lid " (06/14/2018)   FOOT NEUROMA SURGERY Left    immunotherapy     INGUINAL HERNIA REPAIR Right 02/2018   IR IMAGING GUIDED PORT INSERTION  11/23/2018   NM MYOCAR PERF WALL MOTION  07/11/2008   MILD ISCHEMIA IN THE BASL INFERIOR, MID INFERIOR & APICAL INFERIOR REGIONS   SALIVARY GLAND SURGERY Left 04/2011   "squamous cell cancer attached to it; took the gland out"   SKIN CANCER EXCISION     "arms, hands, face, neck" (06/14/2018)   TRANSURETHRAL RESECTION OF BLADDER TUMOR WITH GYRUS (TURBT-GYRUS)  2010    SOCIAL HISTORY: Social History   Socioeconomic History   Marital status: Married    Spouse name: Not on file   Number of children: Not on file   Years of education: Not on file   Highest education level: Not on file  Occupational History  Not on file  Tobacco Use   Smoking status: Every Day    Packs/day: 0.50    Years: 52.00    Total pack years: 26.00    Types: Cigarettes    Start date: 1968   Smokeless tobacco: Never   Tobacco comments:    03/21/19- smoking 10 cigs a day   Vaping Use   Vaping Use: Never used  Substance and Sexual Activity   Alcohol use: Never   Drug use: Never   Sexual activity: Not on file  Other Topics Concern   Not on file  Social History Narrative   Not on file   Social Determinants of Health   Financial Resource Strain: Not on file  Food Insecurity: Not on file  Transportation Needs: Not on file  Physical Activity: Not on file  Stress: Not on file  Social Connections: Not on file  Intimate Partner Violence: Not on file    FAMILY HISTORY: Family History  Problem Relation Age of Onset   Heart attack Mother 28   Cancer Mother        Lung   Diabetes Mother    Heart disease Mother    Hypertension Mother     Stroke Father 34   Cancer Father        Lung   Brain cancer Brother    Cancer Sister        Melanoma of great Toe   Hyperlipidemia Sister    ROS 10 Point review of Systems was done is negative except as noted above.  PHYSICAL EXAMINATION  ECOG PERFORMANCE STATUS: 1 - Symptomatic but completely ambulatory  Vitals:   02/19/22 1254  BP: (!) 99/55  Pulse: 74  Resp: 18  Temp: 97.7 F (36.5 C)  SpO2: 98%   NAD GENERAL:alert, in no acute distress and comfortable SKIN: no acute rashes, no significant lesions EYES: conjunctiva are pink and non-injected, sclera anicteric NECK: supple, no JVD, pain and slight redness/swelling (improved) over left ear. LYMPH:  no palpable lymphadenopathy in the cervical, axillary or inguinal regions LUNGS: clear to auscultation b/l with normal respiratory effort HEART: regular rate & rhythm ABDOMEN:  normoactive bowel sounds , non tender, not distended. Extremity: no pedal edema PSYCH: alert & oriented x 3 with fluent speech NEURO: no focal motor/sensory deficits  LABORATORY DATA: .    Latest Ref Rng & Units 01/29/2022    2:45 PM 01/07/2022   10:18 AM 12/11/2021    2:31 PM  CBC  WBC 4.0 - 10.5 K/uL 8.3  11.4  13.3   Hemoglobin 13.0 - 17.0 g/dL 11.3  11.7  12.0   Hematocrit 39.0 - 52.0 % 33.2  35.5  36.0   Platelets 150 - 400 K/uL 216  202  242       Latest Ref Rng & Units 01/29/2022    2:45 PM 01/07/2022   10:18 AM 12/11/2021    2:31 PM  CMP  Glucose 70 - 99 mg/dL 131  99  97   BUN 8 - 23 mg/dL _0 Creatinine 0.61 - 1.24 mg/dL 0.96  0.77  0.92   Sodium 135 - 145 mmol/L 141  137  141   Potassium 3.5 - 5.1 mmol/L 4.0  4.2  4.6   Chloride 98 - 111 mmol/L 105  105  107   CO2 22 - 32 mmol/L _1 Calcium 8.9 - 10.3 mg/dL 9.0  9.1  9.1   Total  Protein 6.5 - 8.1 g/dL 6.9  6.7  6.9   Total Bilirubin 0.3 - 1.2 mg/dL 0.6  0.5  0.2   Alkaline Phos 38 - 126 U/L 53  56  55   AST 15 - 41 U/L _0 ALT 0 - 44 U/L _1 ASSESSMENT and THERAPY PLAN:   76 yo male    1. 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 Bone metastases - T5,T6 and T8-previously on Xgeva on hold status post his extensive dental extractions in October 2022. 3.  EX smoker 4.  Cancer related pain well controlled with on high dose narcotics (fentanyl patch 50 mcg/h, morphine 15 mg 3 times a day as needed)  5. 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 Has regularly followed up with ENT Dr. Fenton Malling 7.  Recent worsening left ear discharge and pain.  Recent CT temporal bones which showed concerns for chronic osteomyelitis + osteoradionecrosis.  Plan -Patient's labs done today were reviewed in detail and are stable. -No notable toxicities from his pembrolizumab at this time. -We will continue pembrolizumab every 3 weeks with labs.  Orders placed and signed. -We will continue current pain management for his cancer related pain which is well controlled with his current regimen of fentanyl patch and as needed morphine. -Following with Dr. Owens Shark at 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 reviewed outside records and confirmed information with patient. -We discussed we may have to hold the [facility] while on hyperbaric oxygen. -No other toxicities from his current pembrolizumab immunotherapy. -Dr. Eda Keys is seeing him of suspected prostate cancer. He has an upcoming biopsy scheduled for 03/17/2022. -RTC in 6 weeks with labs. Delton See plan discontinued.  Follow-up Please schedule  next 3 cycles of maintenance pembrolizumab with labs MD visit in 6 weeks Xgeva plan which was on hold has been completely discontinued.  The total time spent in the appointment was 30 minutes*.  All of the patient's questions were answered with apparent satisfaction. The patient knows to call the clinic with any problems, questions or concerns.   Sullivan Lone MD MS AAHIVMS [facility] Hematology/Oncology Physician [facility]  .*Total Encounter Time as defined by the Centers for Medicare and Medicaid Services includes, in addition to the face-to-face time of a patient visit (documented in the note above) non-face-to-face time: obtaining and reviewing outside history, ordering and reviewing medications, tests or procedures, care coordination (communications with other health care professionals or caregivers) and documentation in the medical record.  I, Melene Muller, am acting as scribe for Dr. Sullivan Lone, MD.  .I have reviewed the above documentation for accuracy and completeness, and I agree with the above. Brunetta Genera MD

## 2022-02-19 NOTE — Patient Instructions (Signed)
[facility]  Discharge Instructions: Thank you for choosing [facility] to provide your oncology and hematology care.   If you have a lab appointment with the [facility], please go directly to the [facility] and check in at the registration area.   Wear comfortable clothing and clothing appropriate for easy access to any Portacath or PICC line.   We strive to give you quality time with your provider. You may need to reschedule your appointment if you arrive late (15 or more minutes).  Arriving late affects you and other patients whose appointments are after yours.  Also, if you miss three or more appointments without notifying the office, you may be dismissed from the clinic at the provider's discretion.      For prescription refill requests, have your pharmacy contact our office and allow 72 hours for refills to be completed.    Today you received the following chemotherapy and/or immunotherapy agents Beryle Flock      To help prevent nausea and vomiting after your treatment, we encourage you to take your nausea medication as directed.  BELOW ARE SYMPTOMS THAT SHOULD BE REPORTED IMMEDIATELY: *FEVER GREATER THAN 100.4 F (38 C) OR HIGHER *CHILLS OR SWEATING *NAUSEA AND VOMITING THAT IS NOT CONTROLLED WITH YOUR NAUSEA MEDICATION *UNUSUAL SHORTNESS OF BREATH *UNUSUAL BRUISING OR BLEEDING *URINARY PROBLEMS (pain or burning when urinating, or frequent urination) *BOWEL PROBLEMS (unusual diarrhea, constipation, pain near the anus) TENDERNESS IN MOUTH AND THROAT WITH OR WITHOUT PRESENCE OF ULCERS (sore throat, sores in mouth, or a toothache) UNUSUAL RASH, SWELLING OR PAIN  UNUSUAL VAGINAL DISCHARGE OR ITCHING   Items with * indicate a potential emergency and should be followed up as soon as possible or go to the Emergency Department if any problems should occur.  Please show the CHEMOTHERAPY ALERT CARD or IMMUNOTHERAPY ALERT CARD at check-in to  the Emergency Department and triage nurse.  Should you have questions after your visit or need to cancel or reschedule your appointment, please contact [facility]  Dept: [phone]  and follow the prompts.  Office hours are 8:00 a.m. to 4:30 p.m. Monday - Friday. Please note that voicemails left after 4:00 p.m. may not be returned until the following business day.  We are closed weekends and major holidays. You have access to a nurse at all times for urgent questions. Please call the main number to the clinic Dept: [phone] and follow the prompts.   For any non-urgent questions, you may also contact your provider using MyChart. We now offer e-Visits for anyone 75 and older to request care online for non-urgent symptoms. For details visit [email or web address].   Also download the MyChart app! Go to the app store, search "MyChart", open the app, select [facility], and log in with your MyChart username and password.  Due to Covid, a mask is required upon entering the hospital/clinic. If you do not have a mask, one will be given to you upon arrival. For doctor visits, patients may have 1 support person aged 75 or older with them. For treatment visits, patients cannot have anyone with them due to current Covid guidelines and our immunocompromised population.

## 2022-02-20 ENCOUNTER — Other Ambulatory Visit: Payer: Self-pay

## 2022-02-20 ENCOUNTER — Other Ambulatory Visit: Payer: Self-pay | Admitting: Cardiovascular Disease

## 2022-02-20 DIAGNOSIS — C3492 Malignant neoplasm of unspecified part of left bronchus or lung: Secondary | ICD-10-CM

## 2022-02-21 ENCOUNTER — Encounter: Payer: Self-pay | Admitting: Hematology

## 2022-02-21 MED ORDER — FENTANYL 50 MCG/HR TD PT72: 1.0000 | MEDICATED_PATCH | TRANSDERMAL | 0 refills | Status: DC

## 2022-02-21 MED ORDER — MORPHINE SULFATE 15 MG PO TABS: 15.0000 mg | ORAL_TABLET | ORAL | 0 refills | Status: DC | PRN

## 2022-02-23 ENCOUNTER — Encounter: Payer: Self-pay | Admitting: Hematology

## 2022-02-24 ENCOUNTER — Telehealth: Payer: Self-pay | Admitting: Hematology

## 2022-02-24 NOTE — Telephone Encounter (Signed)
Scheduled per 06/14 los, patient has been called and voicemail was left regarding upcoming appointments.

## 2022-03-01 ENCOUNTER — Other Ambulatory Visit: Payer: Self-pay | Admitting: Registered Nurse

## 2022-03-01 ENCOUNTER — Other Ambulatory Visit: Payer: Self-pay | Admitting: Family

## 2022-03-01 DIAGNOSIS — J449 Chronic obstructive pulmonary disease, unspecified: Secondary | ICD-10-CM

## 2022-03-05 DIAGNOSIS — H60312 Diffuse otitis externa, left ear: Secondary | ICD-10-CM | POA: Diagnosis not present

## 2022-03-12 ENCOUNTER — Inpatient Hospital Stay: Payer: No Typology Code available for payment source

## 2022-03-12 ENCOUNTER — Inpatient Hospital Stay: Payer: No Typology Code available for payment source | Attending: Hematology

## 2022-03-12 ENCOUNTER — Other Ambulatory Visit: Payer: Self-pay

## 2022-03-12 VITALS — BP 108/64 | HR 61 | Temp 98.9°F | Resp 16

## 2022-03-12 DIAGNOSIS — Z7189 Other specified counseling: Secondary | ICD-10-CM

## 2022-03-12 DIAGNOSIS — Z5112 Encounter for antineoplastic immunotherapy: Secondary | ICD-10-CM | POA: Diagnosis present

## 2022-03-12 DIAGNOSIS — C3412 Malignant neoplasm of upper lobe, left bronchus or lung: Secondary | ICD-10-CM | POA: Diagnosis present

## 2022-03-12 DIAGNOSIS — C3490 Malignant neoplasm of unspecified part of unspecified bronchus or lung: Secondary | ICD-10-CM

## 2022-03-12 DIAGNOSIS — C7951 Secondary malignant neoplasm of bone: Secondary | ICD-10-CM | POA: Diagnosis present

## 2022-03-12 DIAGNOSIS — G893 Neoplasm related pain (acute) (chronic): Secondary | ICD-10-CM | POA: Insufficient documentation

## 2022-03-12 DIAGNOSIS — C3492 Malignant neoplasm of unspecified part of left bronchus or lung: Secondary | ICD-10-CM

## 2022-03-12 DIAGNOSIS — Z95828 Presence of other vascular implants and grafts: Secondary | ICD-10-CM

## 2022-03-12 LAB — CMP (CANCER CENTER ONLY)
ALT: 20 U/L (ref 0–44)
AST: 22 U/L (ref 15–41)
Albumin: 3.5 g/dL (ref 3.5–5.0)
Alkaline Phosphatase: 56 U/L (ref 38–126)
Anion gap: 5 (ref 5–15)
BUN: 21 mg/dL (ref 8–23)
CO2: 30 mmol/L (ref 22–32)
Calcium: 9.5 mg/dL (ref 8.9–10.3)
Chloride: 107 mmol/L (ref 98–111)
Creatinine: 0.99 mg/dL (ref 0.61–1.24)
GFR, Estimated: >60 mL/min (ref >60)
Glucose, Bld: 104 mg/dL — ABNORMAL HIGH (ref 70–99)
Potassium: 4 mmol/L (ref 3.5–5.1)
Sodium: 142 mmol/L (ref 135–145)
Total Bilirubin: 0.5 mg/dL (ref 0.3–1.2)
Total Protein: 6.8 g/dL (ref 6.5–8.1)

## 2022-03-12 LAB — CBC WITH DIFFERENTIAL (CANCER CENTER ONLY)
Abs Immature Granulocytes: 0.01 10*3/uL (ref 0.00–0.07)
Basophils Absolute: 0 10*3/uL (ref 0.0–0.1)
Basophils Relative: 0 %
Eosinophils Absolute: 0.2 10*3/uL (ref 0.0–0.5)
Eosinophils Relative: 3 %
HCT: 35.5 % — ABNORMAL LOW (ref 39.0–52.0)
Hemoglobin: 11.9 g/dL — ABNORMAL LOW (ref 13.0–17.0)
Immature Granulocytes: 0 %
Lymphocytes Relative: 8 %
Lymphs Abs: 0.6 10*3/uL — ABNORMAL LOW (ref 0.7–4.0)
MCH: 32.2 pg (ref 26.0–34.0)
MCHC: 33.5 g/dL (ref 30.0–36.0)
MCV: 95.9 fL (ref 80.0–100.0)
Monocytes Absolute: 0.6 10*3/uL (ref 0.1–1.0)
Monocytes Relative: 9 %
Neutro Abs: 5.8 10*3/uL (ref 1.7–7.7)
Neutrophils Relative %: 80 %
Platelet Count: 162 10*3/uL (ref 150–400)
RBC: 3.7 MIL/uL — ABNORMAL LOW (ref 4.22–5.81)
RDW: 14.4 % (ref 11.5–15.5)
WBC Count: 7.3 10*3/uL (ref 4.0–10.5)
nRBC: 0 % (ref 0.0–0.2)

## 2022-03-12 MED ORDER — SODIUM CHLORIDE 0.9% FLUSH
10.0000 mL | INTRAVENOUS | Status: DC | PRN
  Administered 2022-03-12: 10 mL via INTRAVENOUS

## 2022-03-12 MED ORDER — SODIUM CHLORIDE 0.9% FLUSH
10.0000 mL | INTRAVENOUS | Status: DC | PRN
  Administered 2022-03-12: 10 mL

## 2022-03-12 MED ORDER — DIPHENHYDRAMINE HCL 50 MG/ML IJ SOLN
25.0000 mg | Freq: Once | INTRAMUSCULAR | Status: AC
  Administered 2022-03-12: 25 mg via INTRAVENOUS
  Filled 2022-03-12: qty 1

## 2022-03-12 MED ORDER — HEPARIN SOD (PORK) LOCK FLUSH 100 UNIT/ML IV SOLN
500.0000 [IU] | Freq: Once | INTRAVENOUS | Status: AC | PRN
  Administered 2022-03-12: 500 [IU]

## 2022-03-12 MED ORDER — SODIUM CHLORIDE 0.9 % IV SOLN: Freq: Once | INTRAVENOUS | Status: AC

## 2022-03-12 MED ORDER — SODIUM CHLORIDE 0.9 % IV SOLN
200.0000 mg | Freq: Once | INTRAVENOUS | Status: AC
  Administered 2022-03-12: 200 mg via INTRAVENOUS
  Filled 2022-03-12: qty 200

## 2022-03-12 MED ORDER — FAMOTIDINE IN NACL 20-0.9 MG/50ML-% IV SOLN
20.0000 mg | Freq: Once | INTRAVENOUS | Status: AC
  Administered 2022-03-12: 20 mg via INTRAVENOUS
  Filled 2022-03-12: qty 50

## 2022-03-12 NOTE — Patient Instructions (Signed)
[facility]   Discharge Instructions: Thank you for choosing [facility] to provide your oncology and hematology care.   If you have a lab appointment with the [facility], please go directly to the [facility] and check in at the registration area.   Wear comfortable clothing and clothing appropriate for easy access to any Portacath or PICC line.   We strive to give you quality time with your provider. You may need to reschedule your appointment if you arrive late (15 or more minutes).  Arriving late affects you and other patients whose appointments are after yours.  Also, if you miss three or more appointments without notifying the office, you may be dismissed from the clinic at the provider's discretion.      For prescription refill requests, have your pharmacy contact our office and allow 72 hours for refills to be completed.    Today you received the following chemotherapy and/or immunotherapy agents: pembrolizumab      To help prevent nausea and vomiting after your treatment, we encourage you to take your nausea medication as directed.  BELOW ARE SYMPTOMS THAT SHOULD BE REPORTED IMMEDIATELY: *FEVER GREATER THAN 100.4 F (38 C) OR HIGHER *CHILLS OR SWEATING *NAUSEA AND VOMITING THAT IS NOT CONTROLLED WITH YOUR NAUSEA MEDICATION *UNUSUAL SHORTNESS OF BREATH *UNUSUAL BRUISING OR BLEEDING *URINARY PROBLEMS (pain or burning when urinating, or frequent urination) *BOWEL PROBLEMS (unusual diarrhea, constipation, pain near the anus) TENDERNESS IN MOUTH AND THROAT WITH OR WITHOUT PRESENCE OF ULCERS (sore throat, sores in mouth, or a toothache) UNUSUAL RASH, SWELLING OR PAIN  UNUSUAL VAGINAL DISCHARGE OR ITCHING   Items with * indicate a potential emergency and should be followed up as soon as possible or go to the Emergency Department if any problems should occur.  Please show the CHEMOTHERAPY ALERT CARD or IMMUNOTHERAPY ALERT CARD at  check-in to the Emergency Department and triage nurse.  Should you have questions after your visit or need to cancel or reschedule your appointment, please contact [facility]  Dept: [phone]  and follow the prompts.  Office hours are 8:00 a.m. to 4:30 p.m. Monday - Friday. Please note that voicemails left after 4:00 p.m. may not be returned until the following business day.  We are closed weekends and major holidays. You have access to a nurse at all times for urgent questions. Please call the main number to the clinic Dept: [phone] and follow the prompts.   For any non-urgent questions, you may also contact your provider using MyChart. We now offer e-Visits for anyone 17 and older to request care online for non-urgent symptoms. For details visit [email or web address].   Also download the MyChart app! Go to the app store, search "MyChart", open the app, select [facility], and log in with your MyChart username and password.  Masks are optional in the cancer centers. If you would like for your care team to wear a mask while they are taking care of you, please let them know. For doctor visits, patients may have with them one support person who is at least 75 years old. At this time, visitors are not allowed in the infusion area.

## 2022-03-13 LAB — TSH: TSH: 1.101 u[IU]/mL (ref 0.350–4.500)

## 2022-03-24 ENCOUNTER — Other Ambulatory Visit: Payer: Self-pay

## 2022-03-24 DIAGNOSIS — C3492 Malignant neoplasm of unspecified part of left bronchus or lung: Secondary | ICD-10-CM

## 2022-03-25 ENCOUNTER — Encounter: Payer: Self-pay | Admitting: Hematology

## 2022-03-25 MED ORDER — ONDANSETRON HCL 8 MG PO TABS: 8.0000 mg | ORAL_TABLET | Freq: Three times a day (TID) | ORAL | 2 refills | Status: DC | PRN

## 2022-03-25 MED ORDER — FENTANYL 50 MCG/HR TD PT72: 1.0000 | MEDICATED_PATCH | TRANSDERMAL | 0 refills | Status: DC

## 2022-03-25 MED ORDER — MORPHINE SULFATE 15 MG PO TABS: 15.0000 mg | ORAL_TABLET | ORAL | 0 refills | Status: DC | PRN

## 2022-03-27 ENCOUNTER — Ambulatory Visit: Payer: No Typology Code available for payment source | Admitting: Registered Nurse

## 2022-03-31 ENCOUNTER — Encounter: Payer: Self-pay | Admitting: Registered Nurse

## 2022-03-31 ENCOUNTER — Other Ambulatory Visit: Payer: Self-pay

## 2022-03-31 ENCOUNTER — Ambulatory Visit (INDEPENDENT_AMBULATORY_CARE_PROVIDER_SITE_OTHER): Payer: Medicare Other | Admitting: Registered Nurse

## 2022-03-31 VITALS — BP 112/62 | HR 66 | Temp 98.4°F | Resp 18 | Ht 70.0 in | Wt 127.6 lb

## 2022-03-31 DIAGNOSIS — Z13220 Encounter for screening for lipoid disorders: Secondary | ICD-10-CM | POA: Diagnosis not present

## 2022-03-31 DIAGNOSIS — Z8639 Personal history of other endocrine, nutritional and metabolic disease: Secondary | ICD-10-CM

## 2022-03-31 DIAGNOSIS — H66002 Acute suppurative otitis media without spontaneous rupture of ear drum, left ear: Secondary | ICD-10-CM | POA: Diagnosis not present

## 2022-03-31 LAB — LIPID PANEL
Cholesterol: 138 mg/dL (ref 0–200)
HDL: 56.3 mg/dL (ref >39.00)
LDL Cholesterol: 66 mg/dL (ref 0–99)
NonHDL: 81.45
Total CHOL/HDL Ratio: 2
Triglycerides: 75 mg/dL (ref 0.0–149.0)
VLDL: 15 mg/dL (ref 0.0–40.0)

## 2022-03-31 LAB — HEMOGLOBIN A1C: Hgb A1c MFr Bld: 6.1 % (ref 4.6–6.5)

## 2022-03-31 NOTE — Patient Instructions (Addendum)
Kyle Foster -   Doristine Devoid to see you. I wish you all the best going forward.  Call if you need anything -   I recommend these providers:  Inda Coke, PA Dimas Chyle, MD Berniece Pap, MD Myrna Blazer Early, NP Jeralyn Ruths, DNP   Stay well, thank you for letting me take part in your care.   [name]   If you have lab work done today you will be contacted with your lab results within the next 2 weeks.  If you have not heard from [place] then please contact us. The fastest way to get your results is to register for My Chart.   IF you received an x-ray today, you will receive an invoice from [facility]. Please contact [facility] at [phone] with questions or concerns regarding your invoice.   IF you received labwork today, you will receive an invoice from [facility]. Please contact [facility] at [phone] with questions or concerns regarding your invoice.   Our billing staff will not be able to assist you with questions regarding bills from these companies.  You will be contacted with the lab results as soon as they are available. The fastest way to get your results is to activate your My Chart account. Instructions are located on the last page of this paperwork. If you have not heard from [place] regarding the results in 2 weeks, please contact this office.

## 2022-03-31 NOTE — Progress Notes (Signed)
Established Patient Office Visit  Subjective:  Patient ID: Kyle Foster, male    DOB: 08/01/1946  Age: 76 y.o. MRN: [id number]  CC:  Chief Complaint  Patient presents with   Follow-up    Patient states he is here for a 6 month follow up.    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 He continues to follow with ENT and onc ENT for this.   Does have upcoming prostate biopsy on aug 3 for potential prostate ca. Follows with Dr. Junious Silk.   Otherwise, no acute concerns.  Pain continues to be well controlled.  Weight is steady. No new GI concerns.   Outpatient Medications Prior to Visit  Medication Sig Dispense Refill   acetaminophen (TYLENOL) 500 MG tablet Take 1,000 mg by mouth every 8 (eight) hours as needed for moderate pain.     acetic acid-hydrocortisone (VOSOL-HC) OTIC solution Place 3 drops into the left ear 3 (three) times daily as needed. 10 mL 0   atorvastatin (LIPITOR) 80 MG tablet Take 1 tablet (80 mg total) by mouth daily. 90 tablet 2   B Complex-C (B-COMPLEX WITH VITAMIN C) tablet Take 1 tablet by mouth daily.     calcium carbonate (TUMS - DOSED IN MG ELEMENTAL CALCIUM) 500 MG chewable tablet Chew 1 tablet (200 mg of elemental calcium total) by mouth 3 (three) times daily with meals. (Patient taking differently: Chew 1 tablet by mouth 3 (three) times daily as needed for indigestion.) 30 tablet 3   Cholecalciferol (VITAMIN D) 2000 UNITS tablet Take 2,000 Units by mouth daily.     CIPRODEX OTIC suspension SMARTSIG:In Ear(s)     clopidogrel (PLAVIX) 75 MG tablet Take 1 tablet (75 mg total) by mouth daily. NEEDS APPOINTMENT FOR FUTURE REFILLS 90 tablet 3   DENTA 5000 PLUS 1.1 % CREA dental cream Place 1 application onto teeth at bedtime.     doxycycline (VIBRA-TABS) 100 MG tablet Take 1 tablet (100 mg total) by mouth 2 (two) times daily. 20 tablet 0   DULoxetine (CYMBALTA)  30 MG capsule TAKE 1 CAPSULE BY MOUTH EVERY DAY 90 capsule 2   fentaNYL (DURAGESIC) 50 MCG/HR Place 1 patch onto the skin every 3 (three) days. 10 patch 0   Hypromellose (ARTIFICIAL TEARS OP) Place 1 drop into both eyes at bedtime.      lidocaine-prilocaine (EMLA) cream Apply 1 application topically as needed (access port). 30 g 3   loratadine (CLARITIN) 10 MG tablet Take 10 mg by mouth daily as needed for allergies.     magnesium oxide (MAG-OX) 400 MG tablet Take 1 tablet (400 mg total) by mouth daily. 90 tablet 0   metoprolol tartrate (LOPRESSOR) 25 MG tablet TAKE 1 TABLET BY MOUTH TWICE A DAY 180 tablet 2   morphine (MSIR) 15 MG tablet Take 1-2 tablets (15-30 mg total) by mouth every 4 (four) hours as needed for moderate pain or severe pain. 90 tablet 0   ondansetron (ZOFRAN) 8 MG tablet Take 1 tablet (8 mg total) by mouth every 8 (eight) hours as needed for nausea or vomiting. 30 tablet 2   pantoprazole (PROTONIX) 40 MG tablet TAKE 1 TABLET BY MOUTH EVERY DAY (Patient taking differently: Take 40 mg by mouth daily.) 90 tablet 3   penicillin v potassium (VEETID) 500 MG tablet Take 500 mg by mouth 3 (three) times daily.  TRELEGY ELLIPTA 100-62.5-25 MCG/ACT AEPB INHALE 1 PUFF BY MOUTH EVERY DAY 60 each 1   ipratropium-albuterol (DUONEB) 0.5-2.5 (3) MG/3ML SOLN Take 3 mLs by nebulization every 6 (six) hours as needed. Start with using nebulizer after awakening in the morning, mid-day, and then prior to bedtime for the first week-10 days then back off to as needed as congestion and wheezing improves. 360 mL 0   No facility-administered medications prior to visit.    Review of Systems  Constitutional: Negative.   HENT: Negative.    Eyes: Negative.   Respiratory: Negative.    Cardiovascular: Negative.   Gastrointestinal: Negative.   Genitourinary: Negative.   Musculoskeletal: Negative.   Skin: Negative.   Neurological: Negative.   Psychiatric/Behavioral: Negative.    All other systems  reviewed and are negative.     Objective:     BP 112/62   Pulse 66   Temp 98.4 F (36.9 C) (Temporal)   Resp 18   Ht 5\' 10"  (1.778 m)   Wt 127 lb 9.6 oz (57.9 kg)   SpO2 98%   BMI 18.31 kg/m   Wt Readings from Last 3 Encounters:  03/31/22 127 lb 9.6 oz (57.9 kg)  02/19/22 127 lb 8 oz (57.8 kg)  01/29/22 124 lb 12 oz (56.6 kg)   Physical Exam Constitutional:      General: He is not in acute distress.    Appearance: Normal appearance. He is normal weight. He is not ill-appearing, toxic-appearing or diaphoretic.  Cardiovascular:     Rate and Rhythm: Normal rate and regular rhythm.     Heart sounds: Normal heart sounds. No murmur heard.    No friction rub. No gallop.  Pulmonary:     Effort: Pulmonary effort is normal.     Breath sounds: Normal breath sounds.     Comments: Baseline for pt Neurological:     General: No focal deficit present.     Mental Status: He is alert and oriented to person, place, and time. Mental status is at baseline.  Psychiatric:        Mood and Affect: Mood normal.        Behavior: Behavior normal.        Thought Content: Thought content normal.        Judgment: Judgment normal.     No results found for any visits on 03/31/22.    The 10-year ASCVD risk score (Arnett DK, et al., 2019) is: 20.8%    Assessment & Plan:   Problem List Items Addressed This Visit   None Visit Diagnoses     Non-recurrent acute suppurative otitis media of left ear without spontaneous rupture of tympanic membrane    -  Primary   History of elevated glucose       Relevant Orders   Hemoglobin A1c   Lipid screening       Relevant Orders   Lipid panel       No orders of the defined types were placed in this encounter.   Return if symptoms worsen or fail to improve.   PLAN Recheck labs Continue to follow with specialists Follow up in 6 mo with new PCP Patient encouraged to call clinic with any questions, comments, or concerns.    [name],  NP

## 2022-04-02 ENCOUNTER — Other Ambulatory Visit: Payer: No Typology Code available for payment source

## 2022-04-02 ENCOUNTER — Ambulatory Visit: Payer: No Typology Code available for payment source

## 2022-04-02 ENCOUNTER — Inpatient Hospital Stay (HOSPITAL_BASED_OUTPATIENT_CLINIC_OR_DEPARTMENT_OTHER): Payer: No Typology Code available for payment source | Admitting: Hematology

## 2022-04-02 ENCOUNTER — Inpatient Hospital Stay: Payer: No Typology Code available for payment source

## 2022-04-02 ENCOUNTER — Other Ambulatory Visit: Payer: Self-pay | Admitting: Hematology

## 2022-04-02 ENCOUNTER — Encounter: Payer: Self-pay | Admitting: Hematology

## 2022-04-02 VITALS — BP 104/51 | HR 60 | Temp 97.3°F | Resp 18 | Wt 127.4 lb

## 2022-04-02 DIAGNOSIS — C7951 Secondary malignant neoplasm of bone: Secondary | ICD-10-CM

## 2022-04-02 DIAGNOSIS — Z5112 Encounter for antineoplastic immunotherapy: Secondary | ICD-10-CM

## 2022-04-02 DIAGNOSIS — C3492 Malignant neoplasm of unspecified part of left bronchus or lung: Secondary | ICD-10-CM

## 2022-04-02 DIAGNOSIS — Z95828 Presence of other vascular implants and grafts: Secondary | ICD-10-CM

## 2022-04-02 DIAGNOSIS — C3412 Malignant neoplasm of upper lobe, left bronchus or lung: Secondary | ICD-10-CM

## 2022-04-02 DIAGNOSIS — C3490 Malignant neoplasm of unspecified part of unspecified bronchus or lung: Secondary | ICD-10-CM

## 2022-04-02 DIAGNOSIS — Z7189 Other specified counseling: Secondary | ICD-10-CM

## 2022-04-02 LAB — CBC WITH DIFFERENTIAL (CANCER CENTER ONLY)
Abs Immature Granulocytes: 0.01 10*3/uL (ref 0.00–0.07)
Basophils Absolute: 0 10*3/uL (ref 0.0–0.1)
Basophils Relative: 0 %
Eosinophils Absolute: 0.2 10*3/uL (ref 0.0–0.5)
Eosinophils Relative: 3 %
HCT: 35.5 % — ABNORMAL LOW (ref 39.0–52.0)
Hemoglobin: 11.9 g/dL — ABNORMAL LOW (ref 13.0–17.0)
Immature Granulocytes: 0 %
Lymphocytes Relative: 10 %
Lymphs Abs: 0.7 10*3/uL (ref 0.7–4.0)
MCH: 32 pg (ref 26.0–34.0)
MCHC: 33.5 g/dL (ref 30.0–36.0)
MCV: 95.4 fL (ref 80.0–100.0)
Monocytes Absolute: 0.7 10*3/uL (ref 0.1–1.0)
Monocytes Relative: 10 %
Neutro Abs: 5.3 10*3/uL (ref 1.7–7.7)
Neutrophils Relative %: 77 %
Platelet Count: 140 10*3/uL — ABNORMAL LOW (ref 150–400)
RBC: 3.72 MIL/uL — ABNORMAL LOW (ref 4.22–5.81)
RDW: 13.4 % (ref 11.5–15.5)
WBC Count: 6.9 10*3/uL (ref 4.0–10.5)
nRBC: 0 % (ref 0.0–0.2)

## 2022-04-02 LAB — CMP (CANCER CENTER ONLY)
ALT: 19 U/L (ref 0–44)
AST: 24 U/L (ref 15–41)
Albumin: 3.6 g/dL (ref 3.5–5.0)
Alkaline Phosphatase: 56 U/L (ref 38–126)
Anion gap: 7 (ref 5–15)
BUN: 29 mg/dL — ABNORMAL HIGH (ref 8–23)
CO2: 31 mmol/L (ref 22–32)
Calcium: 9.7 mg/dL (ref 8.9–10.3)
Chloride: 103 mmol/L (ref 98–111)
Creatinine: 1.02 mg/dL (ref 0.61–1.24)
GFR, Estimated: >60 mL/min (ref >60)
Glucose, Bld: 84 mg/dL (ref 70–99)
Potassium: 4.4 mmol/L (ref 3.5–5.1)
Sodium: 141 mmol/L (ref 135–145)
Total Bilirubin: 0.4 mg/dL (ref 0.3–1.2)
Total Protein: 6.8 g/dL (ref 6.5–8.1)

## 2022-04-02 MED ORDER — SODIUM CHLORIDE 0.9% FLUSH
10.0000 mL | INTRAVENOUS | Status: DC | PRN
  Administered 2022-04-02: 10 mL

## 2022-04-02 MED ORDER — SODIUM CHLORIDE 0.9 % IV SOLN: Freq: Once | INTRAVENOUS | Status: AC

## 2022-04-02 MED ORDER — DIPHENHYDRAMINE HCL 50 MG/ML IJ SOLN
25.0000 mg | Freq: Once | INTRAMUSCULAR | Status: AC
  Administered 2022-04-02: 25 mg via INTRAVENOUS
  Filled 2022-04-02: qty 1

## 2022-04-02 MED ORDER — SODIUM CHLORIDE 0.9 % IV SOLN
200.0000 mg | Freq: Once | INTRAVENOUS | Status: AC
  Administered 2022-04-02: 200 mg via INTRAVENOUS
  Filled 2022-04-02: qty 200

## 2022-04-02 MED ORDER — FAMOTIDINE IN NACL 20-0.9 MG/50ML-% IV SOLN
20.0000 mg | Freq: Once | INTRAVENOUS | Status: AC
  Administered 2022-04-02: 20 mg via INTRAVENOUS
  Filled 2022-04-02: qty 50

## 2022-04-02 MED ORDER — HEPARIN SOD (PORK) LOCK FLUSH 100 UNIT/ML IV SOLN
500.0000 [IU] | Freq: Once | INTRAVENOUS | Status: AC | PRN
  Administered 2022-04-02: 500 [IU]

## 2022-04-02 NOTE — Patient Instructions (Signed)
[facility]  Discharge Instructions: Thank you for choosing [facility] to provide your oncology and hematology care.   If you have a lab appointment with the [facility], please go directly to the [facility] and check in at the registration area.   Wear comfortable clothing and clothing appropriate for easy access to any Portacath or PICC line.   We strive to give you quality time with your provider. You may need to reschedule your appointment if you arrive late (15 or more minutes).  Arriving late affects you and other patients whose appointments are after yours.  Also, if you miss three or more appointments without notifying the office, you may be dismissed from the clinic at the provider's discretion.      For prescription refill requests, have your pharmacy contact our office and allow 72 hours for refills to be completed.    Today you received the following chemotherapy and/or immunotherapy agents: pembrolizumab Beryle Flock)      To help prevent nausea and vomiting after your treatment, we encourage you to take your nausea medication as directed.  BELOW ARE SYMPTOMS THAT SHOULD BE REPORTED IMMEDIATELY: *FEVER GREATER THAN 100.4 F (38 C) OR HIGHER *CHILLS OR SWEATING *NAUSEA AND VOMITING THAT IS NOT CONTROLLED WITH YOUR NAUSEA MEDICATION *UNUSUAL SHORTNESS OF BREATH *UNUSUAL BRUISING OR BLEEDING *URINARY PROBLEMS (pain or burning when urinating, or frequent urination) *BOWEL PROBLEMS (unusual diarrhea, constipation, pain near the anus) TENDERNESS IN MOUTH AND THROAT WITH OR WITHOUT PRESENCE OF ULCERS (sore throat, sores in mouth, or a toothache) UNUSUAL RASH, SWELLING OR PAIN  UNUSUAL VAGINAL DISCHARGE OR ITCHING   Items with * indicate a potential emergency and should be followed up as soon as possible or go to the Emergency Department if any problems should occur.  Please show the CHEMOTHERAPY ALERT CARD or IMMUNOTHERAPY ALERT CARD  at check-in to the Emergency Department and triage nurse.  Should you have questions after your visit or need to cancel or reschedule your appointment, please contact [facility]  Dept: [phone]  and follow the prompts.  Office hours are 8:00 a.m. to 4:30 p.m. Monday - Friday. Please note that voicemails left after 4:00 p.m. may not be returned until the following business day.  We are closed weekends and major holidays. You have access to a nurse at all times for urgent questions. Please call the main number to the clinic Dept: [phone] and follow the prompts.   For any non-urgent questions, you may also contact your provider using MyChart. We now offer e-Visits for anyone 44 and older to request care online for non-urgent symptoms. For details visit [email or web address].   Also download the MyChart app! Go to the app store, search "MyChart", open the app, select [facility], and log in with your MyChart username and password.  Masks are optional in the cancer centers. If you would like for your care team to wear a mask while they are taking care of you, please let them know. For doctor visits, patients may have with them one support person who is at least 75 years old. At this time, visitors are not allowed in the infusion area.

## 2022-04-03 DIAGNOSIS — H60312 Diffuse otitis externa, left ear: Secondary | ICD-10-CM | POA: Diagnosis not present

## 2022-04-03 DIAGNOSIS — H02205 Unspecified lagophthalmos left lower eyelid: Secondary | ICD-10-CM | POA: Diagnosis not present

## 2022-04-03 DIAGNOSIS — C07 Malignant neoplasm of parotid gland: Secondary | ICD-10-CM | POA: Diagnosis not present

## 2022-04-03 DIAGNOSIS — C689 Malignant neoplasm of urinary organ, unspecified: Secondary | ICD-10-CM | POA: Diagnosis not present

## 2022-04-03 DIAGNOSIS — G510 Bell's palsy: Secondary | ICD-10-CM | POA: Diagnosis not present

## 2022-04-03 LAB — TSH: TSH: 1.771 u[IU]/mL (ref 0.350–4.500)

## 2022-04-04 ENCOUNTER — Other Ambulatory Visit: Payer: Self-pay

## 2022-04-07 ENCOUNTER — Telehealth: Payer: Self-pay | Admitting: Hematology

## 2022-04-07 NOTE — Telephone Encounter (Signed)
Left message with follow-up appointments per 7/26 los.

## 2022-04-08 ENCOUNTER — Other Ambulatory Visit: Payer: Self-pay

## 2022-04-08 NOTE — Progress Notes (Unsigned)
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[place])  10/19/2018 Initial Diagnosis   Bone metastases ([place])   11/24/2018 -  Chemotherapy   The patient had dexamethasone (DECADRON) 4 MG tablet, 1 of 1 cycle, Start date: 11/24/2018, End date: 04/27/2019 palonosetron (ALOXI) injection 0.25 mg, 0.25 mg, Intravenous,  Once, 5 of 5 cycles Administration: 0.25 mg (11/24/2018), 0.25 mg (12/15/2018), 0.25 mg (01/05/2019), 0.25 mg (01/26/2019), 0.25 mg (02/16/2019) pegfilgrastim (NEULASTA ONPRO KIT) injection 6 mg, 6 mg, Subcutaneous, Once, 3 of 3 cycles Administration: 6 mg (01/05/2019), 6 mg (01/26/2019), 6 mg (02/16/2019) pegfilgrastim-cbqv (UDENYCA) injection 6 mg, 6 mg, Subcutaneous, Once, 2 of 2 cycles Administration: 6 mg (11/26/2018), 6 mg (12/17/2018) CARBOplatin (PARAPLATIN) 410 mg in sodium chloride 0.9 % 250 mL chemo infusion, 410 mg (108 % of original dose 381.5 mg), Intravenous,  Once, 5 of 5 cycles Dose modification:   (original dose 381.5 mg, Cycle 1) Administration: 410 mg (11/24/2018), 410 mg (12/15/2018), 410 mg (01/05/2019), 410 mg (01/26/2019), 410 mg (02/16/2019) PACLitaxel (TAXOL) 234 mg in sodium chloride 0.9 % 250 mL chemo infusion (> 101m/m2), 135 mg/m2 = 234 mg (100 % of original dose 135 mg/m2), Intravenous,  Once, 5 of 5 cycles Dose modification: 150 mg/m2 (original dose 135 mg/m2, Cycle 1, Reason: Provider Judgment), 135 mg/m2 (original  dose 135 mg/m2, Cycle 1, Reason: Provider Judgment), 150 mg/m2 (original dose 135 mg/m2, Cycle 2, Reason: Catheter Related Infection) Administration: 234 mg (11/24/2018), 258 mg (12/15/2018), 258 mg (01/05/2019), 258 mg (01/26/2019), 258 mg (02/16/2019) pembrolizumab (KEYTRUDA) 200 mg in sodium chloride 0.9 % 50 mL chemo infusion, 200 mg, Intravenous, Once, 27 of 29 cycles Administration: 200 mg (11/24/2018), 200 mg (12/15/2018), 200 mg (01/05/2019), 200 mg (01/26/2019), 200 mg (02/16/2019), 200 mg (03/09/2019), 200 mg (03/30/2019), 200 mg (04/20/2019), 200 mg (05/11/2019), 200 mg (06/01/2019), 200 mg (06/22/2019), 200 mg (07/13/2019), 200 mg (08/03/2019), 200 mg (09/14/2019), 200 mg (08/24/2019), 200 mg (10/05/2019), 200 mg (10/26/2019), 200 mg (11/16/2019), 200 mg (12/07/2019), 200 mg (12/28/2019), 200 mg (01/18/2020), 200 mg (02/08/2020), 200 mg (02/29/2020), 200 mg (03/21/2020), 200 mg (04/11/2020), 200 mg (05/02/2020), 200 mg (05/24/2020) fosaprepitant (EMEND) 150 mg, dexamethasone (DECADRON) 12 mg in sodium chloride 0.9 % 145 mL IVPB, , Intravenous,  Once, 5 of 5 cycles Administration:  (11/24/2018),  (12/15/2018),  (01/05/2019),  (01/26/2019),  (02/16/2019)  for chemotherapy treatment.    Squamous cell lung cancer, left ([place]  11/24/2018 Initial Diagnosis   Squamous cell lung cancer, left ([place]   11/24/2018 -  Chemotherapy   The patient had dexamethasone (DECADRON) 4 MG tablet, 1 of 1 cycle, Start date: 11/24/2018, End date: 04/27/2019 palonosetron (ALOXI) injection 0.25 mg, 0.25 mg, Intravenous,  Once, 5 of 5 cycles Administration: 0.25 mg (11/24/2018), 0.25 mg (12/15/2018), 0.25 mg (01/05/2019), 0.25 mg (01/26/2019), 0.25 mg (02/16/2019) pegfilgrastim (NEULASTA ONPRO KIT) injection 6 mg, 6 mg, Subcutaneous, Once, 3 of 3 cycles Administration: 6 mg (01/05/2019), 6 mg (01/26/2019), 6 mg (  02/16/2019) pegfilgrastim-cbqv (UDENYCA) injection 6 mg, 6 mg, Subcutaneous, Once, 2 of 2 cycles Administration: 6 mg (11/26/2018), 6 mg  (12/17/2018) CARBOplatin (PARAPLATIN) 410 mg in sodium chloride 0.9 % 250 mL chemo infusion, 410 mg (108 % of original dose 381.5 mg), Intravenous,  Once, 5 of 5 cycles Dose modification:   (original dose 381.5 mg, Cycle 1) Administration: 410 mg (11/24/2018), 410 mg (12/15/2018), 410 mg (01/05/2019), 410 mg (01/26/2019), 410 mg (02/16/2019) PACLitaxel (TAXOL) 234 mg in sodium chloride 0.9 % 250 mL chemo infusion (> 68m/m2), 135 mg/m2 = 234 mg (100 % of original dose 135 mg/m2), Intravenous,  Once, 5 of 5 cycles Dose modification: 150 mg/m2 (original dose 135 mg/m2, Cycle 1, Reason: Provider Judgment), 135 mg/m2 (original dose 135 mg/m2, Cycle 1, Reason: Provider Judgment), 150 mg/m2 (original dose 135 mg/m2, Cycle 2, Reason: Catheter Related Infection) Administration: 234 mg (11/24/2018), 258 mg (12/15/2018), 258 mg (01/05/2019), 258 mg (01/26/2019), 258 mg (02/16/2019) pembrolizumab (KEYTRUDA) 200 mg in sodium chloride 0.9 % 50 mL chemo infusion, 200 mg, Intravenous, Once, 27 of 29 cycles Administration: 200 mg (11/24/2018), 200 mg (12/15/2018), 200 mg (01/05/2019), 200 mg (01/26/2019), 200 mg (02/16/2019), 200 mg (03/09/2019), 200 mg (03/30/2019), 200 mg (04/20/2019), 200 mg (05/11/2019), 200 mg (06/01/2019), 200 mg (06/22/2019), 200 mg (07/13/2019), 200 mg (08/03/2019), 200 mg (09/14/2019), 200 mg (08/24/2019), 200 mg (10/05/2019), 200 mg (10/26/2019), 200 mg (11/16/2019), 200 mg (12/07/2019), 200 mg (12/28/2019), 200 mg (01/18/2020), 200 mg (02/08/2020), 200 mg (02/29/2020), 200 mg (03/21/2020), 200 mg (04/11/2020), 200 mg (05/02/2020), 200 mg (05/24/2020) fosaprepitant (EMEND) 150 mg, dexamethasone (DECADRON) 12 mg in sodium chloride 0.9 % 145 mL IVPB, , Intravenous,  Once, 5 of 5 cycles Administration:  (11/24/2018),  (12/15/2018),  (01/05/2019),  (01/26/2019),  (02/16/2019)  for chemotherapy treatment.      CURRENT THERAPY: Keytruda every 3 weeks  INTERVAL HISTORY:  LLANDEN KNOEDLER77y.o. male who is here for continued evaluation and  management of his stage IV lung squamous cell carcinoma. He reports He is doing well with no new symptoms or concerns.  No new back pain. No new or unexpected weight loss. No SOB or chest pain. No abnormal bowel habits or diarrhea. No other new or acute focal symptoms.  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 We discussed [facility] to potentially reduce lung toxicity from hyperbaric oxygen.  He notes discomfort in jaw but he is eating well.  No other toxicities from his current pembrolizumab immunotherapy.  Dr. MEda Keysis seeing him for suspected prostate cancer. He has an upcoming biopsy scheduled for 03/17/2022.  Labs done today were reviewed with him in detail..  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   H/O agent Orange exposure 03/22/2019   Centrilobular emphysema ([place]) 03/21/2019   Squamous cell lung cancer, left ([place]) 11/24/2018   Counseling regarding advance care planning and goals of care 11/24/2018   Metastatic cancer ([place])    Malignant neoplasm of  upper lobe of left lung ([place])    Malignant neoplasm metastatic to bone ([place])    Severe protein-calorie malnutrition ([place]) 10/15/2018   Palliative care by specialist    Goals of care, counseling/discussion    Cancer associated pain 10/14/2018   CAD S/P percutaneous coronary angioplasty 07/13/2013   Essential hypertension 07/13/2013   Mixed hyperlipidemia 07/13/2013   Tobacco abuse 07/13/2013   Coronary artery disease 07/13/2013   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 immunotherapy    SURGICAL HISTORY: Past Surgical History:  Procedure Laterality Date   CATARACT EXTRACTION W/ INTRAOCULAR LENS IMPLANT Right 12/2007   CORONARY ANGIOPLASTY WITH STENT PLACEMENT  06/2008   "3 stents" (06/14/2018)   CYSTOSCOPY W/ RETROGRADES  09/22/2011   Procedure: CYSTOSCOPY WITH RETROGRADE PYELOGRAM;  Surgeon: Bernestine Amass, MD;  Location: [facility];  Service: Urology;  Laterality: Left;  Cystoscopy left Retrograde Pyelogram      (c-arm)    CYSTOSCOPY WITH BIOPSY  09/22/2011   Procedure: CYSTOSCOPY WITH BIOPSY;  Surgeon: Bernestine Amass, MD;  Location: [facility];  Service: Urology;  Laterality: N/A;   Biopsy   CYSTOSCOPY WITH BIOPSY N/A 04/19/2021    Procedure: CYSTOSCOPY WITH BLADDER  BIOPSY WITH FULGERATION;  Surgeon: [name], MD;  Location: [facility];  Service: Urology;  Laterality: N/A;   EXCISIONAL HEMORRHOIDECTOMY  ~ 2006   EYE SURGERY Left 04/2011   "reconstruction; gold weight in eye lid " (06/14/2018)   FOOT NEUROMA SURGERY Left    immunotherapy     INGUINAL HERNIA REPAIR Right 02/2018   IR IMAGING GUIDED PORT INSERTION  11/23/2018   NM MYOCAR PERF WALL MOTION  07/11/2008   MILD ISCHEMIA IN THE BASL INFERIOR, MID INFERIOR & APICAL INFERIOR REGIONS   SALIVARY GLAND SURGERY Left 04/2011   "squamous cell cancer attached to it; took the gland out"   SKIN CANCER EXCISION     "arms, hands, face, neck" (06/14/2018)   TRANSURETHRAL RESECTION OF BLADDER TUMOR WITH GYRUS (TURBT-GYRUS)  2010    SOCIAL HISTORY: Social History   Socioeconomic History   Marital status: Married    Spouse name: Not on file   Number of children: Not on file   Years of education: Not on file   Highest education  level: Not on file  Occupational History   Not on file  Tobacco Use   Smoking status: Every Day    Packs/day: 0.50    Years: 52.00    Total pack years: 26.00    Types: Cigarettes    Start date: 1968   Smokeless tobacco: Never   Tobacco comments:    03/21/19- smoking 10 cigs a day   Vaping Use   Vaping Use: Never used  Substance and Sexual Activity   Alcohol use: Never   Drug use: Never   Sexual activity: Not on file  Other Topics Concern   Not on file  Social History Narrative   Not on file   Social Determinants of Health   Financial Resource Strain: Not on file  Food Insecurity: Not on file  Transportation Needs: Not on file  Physical Activity: Not on file  Stress: Not on file  Social Connections: Not on file  Intimate Partner Violence: Not on file    FAMILY HISTORY: Family History  Problem Relation Age of Onset   Heart attack Mother 1   Cancer Mother        Lung   Diabetes Mother    Heart disease Mother     Hypertension Mother    Stroke Father 79   Cancer Father        Lung   Brain cancer Brother    Cancer Sister        Melanoma of great Toe   Hyperlipidemia Sister    ROS 10 Point review of Systems was done is negative except as noted above.  PHYSICAL EXAMINATION  ECOG PERFORMANCE STATUS: 1 - Symptomatic but completely ambulatory  Vitals:   04/02/22 1418  BP: (!) 104/51  Pulse: 60  Resp: 18  Temp: (!) 97.3 F (36.3 C)  SpO2: 98%   NAD GENERAL:alert, in no acute distress and comfortable SKIN: no acute rashes, no significant lesions EYES: conjunctiva are pink and non-injected, sclera anicteric NECK: supple, no JVD, pain and slight redness/swelling (improved) over left ear. LYMPH:  no palpable lymphadenopathy in the cervical, axillary or inguinal regions LUNGS: clear to auscultation b/l with normal respiratory effort HEART: regular rate & rhythm ABDOMEN:  normoactive bowel sounds , non tender, not distended. Extremity: no pedal edema PSYCH: alert & oriented x 3 with fluent speech NEURO: no focal motor/sensory deficits  LABORATORY DATA: .    Latest Ref Rng & Units 04/02/2022    2:00 PM 03/12/2022    2:23 PM 02/19/2022   12:40 PM  CBC  WBC 4.0 - 10.5 K/uL 6.9  7.3  8.2   Hemoglobin 13.0 - 17.0 g/dL 11.9  11.9  11.8   Hematocrit 39.0 - 52.0 % 35.5  35.5  35.5   Platelets 150 - 400 K/uL 140  162  180       Latest Ref Rng & Units 04/02/2022    2:00 PM 03/12/2022    2:23 PM 02/19/2022   12:40 PM  CMP  Glucose 70 - 99 mg/dL 84  104  94   BUN 8 - 23 mg/dL 29  21  19    Creatinine 0.61 - 1.24 mg/dL 1.02  0.99  0.92   Sodium 135 - 145 mmol/L 141  142  139   Potassium 3.5 - 5.1 mmol/L 4.4  4.0  4.4   Chloride 98 - 111 mmol/L 103  107  105   CO2 22 - 32 mmol/L 31  30  31    Calcium 8.9 -  10.3 mg/dL 9.7  9.5  9.7   Total Protein 6.5 - 8.1 g/dL 6.8  6.8  6.8   Total Bilirubin 0.3 - 1.2 mg/dL 0.4  0.5  0.7   Alkaline Phos 38 - 126 U/L 56  56  56   AST 15 - 41 U/L 24  22  33    ALT 0 - 44 U/L 19  20  21     ASSESSMENT and THERAPY PLAN:   76 yo male    1. 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 Bone metastases - T5,T6 and T8-previously on Xgeva on hold status post his extensive dental extractions in October 2022. 3.  EX smoker 4.  Cancer related pain well controlled with on high dose narcotics (fentanyl patch 50 mcg/h, morphine 15 mg 3 times a day as needed)  5. 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 Has regularly followed up with ENT Dr. Fenton Malling 7.  Recent worsening left ear discharge and pain.  Recent CT temporal bones which showed concerns for chronic osteomyelitis + osteoradionecrosis.  Plan -Patient's labs done today were reviewed in detail and are stable. -No notable toxicities from his pembrolizumab at this time. -We will continue pembrolizumab every 3 weeks with labs.  Orders placed and signed. -We will continue current pain management for his cancer related pain which is well controlled with his current regimen of fentanyl patch and as needed morphine. -Following with Dr. 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 reviewed outside records and confirmed information with patient. -We discussed we may have to hold the [facility] while on hyperbaric oxygen. -No other toxicities from his current pembrolizumab immunotherapy. -Dr. Eda Keys is seeing him of suspected prostate cancer. He has an upcoming biopsy scheduled for 03/17/2022. -RTC in 6 weeks with labs. Delton See plan  discontinued.  Follow-up Please schedule next 3 cycles of maintenance pembrolizumab with labs MD visit in 6 weeks Xgeva plan which was on hold has been completely discontinued.  The total time spent in the appointment was 30 minutes*.  All of the patient's questions were answered with apparent satisfaction. The patient knows to call the clinic with any problems, questions or concerns.   Sullivan Lone MD MS AAHIVMS [facility] Hematology/Oncology Physician [facility]  .*Total Encounter Time as defined by the Centers for Medicare and Medicaid Services includes, in addition to the face-to-face time of a patient visit (documented in the note above) non-face-to-face time: obtaining and reviewing outside history, ordering and reviewing medications, tests or procedures, care coordination (communications with other health care professionals or caregivers) and documentation in the medical record.  I, Melene Muller, am acting as scribe for Dr. Sullivan Lone, MD.  .I have reviewed the above documentation for accuracy and completeness, and I agree with the above. Brunetta Genera MD

## 2022-04-09 ENCOUNTER — Encounter: Payer: Self-pay | Admitting: Hematology

## 2022-04-10 ENCOUNTER — Other Ambulatory Visit: Payer: Self-pay

## 2022-04-10 DIAGNOSIS — C61 Malignant neoplasm of prostate: Secondary | ICD-10-CM | POA: Diagnosis not present

## 2022-04-10 DIAGNOSIS — N402 Nodular prostate without lower urinary tract symptoms: Secondary | ICD-10-CM | POA: Diagnosis not present

## 2022-04-16 DIAGNOSIS — Z8551 Personal history of malignant neoplasm of bladder: Secondary | ICD-10-CM | POA: Diagnosis not present

## 2022-04-16 DIAGNOSIS — C61 Malignant neoplasm of prostate: Secondary | ICD-10-CM | POA: Diagnosis not present

## 2022-04-21 ENCOUNTER — Other Ambulatory Visit: Payer: Self-pay | Admitting: *Deleted

## 2022-04-21 DIAGNOSIS — C07 Malignant neoplasm of parotid gland: Secondary | ICD-10-CM | POA: Diagnosis not present

## 2022-04-21 NOTE — Patient Instructions (Signed)
Visit Information  Thank you for taking time to visit with me today. Please don't hesitate to contact me if I can be of assistance to you.   Following are the goals we discussed today:   Goals Addressed   None       Please call the care guide team at [phone] if you need to cancel or reschedule your appointment.   If you are experiencing a Mental Health or [place] or need someone to talk to, please call the Suicide and Crisis Lifeline: 988  Patient verbalizes understanding of instructions and care plan provided today and agrees to view in [place]. Active MyChart status and patient understanding of how to access instructions and care plan via MyChart confirmed with patient.     No further follow up required: Declined  Raina Mina, RN Care Management Coordinator [place] Office [phone]

## 2022-04-21 NOTE — Patient Outreach (Signed)
  Care Coordination   Initial Visit Note   04/21/2022 Name: Kyle Foster MRN: [id number] DOB: 02/07/1946  Kyle Foster is a 76 y.o. year old male who sees [name], NP for primary care. I spoke with  [name] by phone today  What matters to the patients health and wellness today?  Declined    Goals Addressed   None     SDOH assessments and interventions completed:  No     Care Coordination Interventions Activated:  No  Care Coordination Interventions:  No, not indicated   Follow up plan: No further intervention required.   Encounter Outcome:  Pt. Refused   Raina Mina, RN Care Management Coordinator [place] Office [phone]

## 2022-04-23 ENCOUNTER — Other Ambulatory Visit: Payer: Self-pay

## 2022-04-23 ENCOUNTER — Other Ambulatory Visit: Payer: No Typology Code available for payment source

## 2022-04-23 ENCOUNTER — Inpatient Hospital Stay: Payer: No Typology Code available for payment source | Attending: Hematology

## 2022-04-23 ENCOUNTER — Other Ambulatory Visit: Payer: Self-pay | Admitting: Hematology

## 2022-04-23 ENCOUNTER — Inpatient Hospital Stay: Payer: No Typology Code available for payment source

## 2022-04-23 VITALS — BP 103/58 | HR 60 | Temp 98.2°F | Resp 15 | Ht 70.0 in | Wt 128.0 lb

## 2022-04-23 DIAGNOSIS — C7951 Secondary malignant neoplasm of bone: Secondary | ICD-10-CM | POA: Diagnosis present

## 2022-04-23 DIAGNOSIS — C3492 Malignant neoplasm of unspecified part of left bronchus or lung: Secondary | ICD-10-CM

## 2022-04-23 DIAGNOSIS — Z5112 Encounter for antineoplastic immunotherapy: Secondary | ICD-10-CM | POA: Insufficient documentation

## 2022-04-23 DIAGNOSIS — Z7189 Other specified counseling: Secondary | ICD-10-CM

## 2022-04-23 DIAGNOSIS — C3412 Malignant neoplasm of upper lobe, left bronchus or lung: Secondary | ICD-10-CM

## 2022-04-23 DIAGNOSIS — F17210 Nicotine dependence, cigarettes, uncomplicated: Secondary | ICD-10-CM | POA: Insufficient documentation

## 2022-04-23 DIAGNOSIS — G893 Neoplasm related pain (acute) (chronic): Secondary | ICD-10-CM | POA: Diagnosis not present

## 2022-04-23 DIAGNOSIS — Z79899 Other long term (current) drug therapy: Secondary | ICD-10-CM | POA: Insufficient documentation

## 2022-04-23 DIAGNOSIS — C3490 Malignant neoplasm of unspecified part of unspecified bronchus or lung: Secondary | ICD-10-CM

## 2022-04-23 DIAGNOSIS — Z95828 Presence of other vascular implants and grafts: Secondary | ICD-10-CM

## 2022-04-23 LAB — TSH: TSH: 1.475 u[IU]/mL (ref 0.350–4.500)

## 2022-04-23 LAB — CMP (CANCER CENTER ONLY)
ALT: 18 U/L (ref 0–44)
AST: 25 U/L (ref 15–41)
Albumin: 3.6 g/dL (ref 3.5–5.0)
Alkaline Phosphatase: 60 U/L (ref 38–126)
Anion gap: 7 (ref 5–15)
BUN: 27 mg/dL — ABNORMAL HIGH (ref 8–23)
CO2: 30 mmol/L (ref 22–32)
Calcium: 9.8 mg/dL (ref 8.9–10.3)
Chloride: 101 mmol/L (ref 98–111)
Creatinine: 1.29 mg/dL — ABNORMAL HIGH (ref 0.61–1.24)
GFR, Estimated: 58 mL/min — ABNORMAL LOW (ref >60)
Glucose, Bld: 108 mg/dL — ABNORMAL HIGH (ref 70–99)
Potassium: 4.3 mmol/L (ref 3.5–5.1)
Sodium: 138 mmol/L (ref 135–145)
Total Bilirubin: 0.5 mg/dL (ref 0.3–1.2)
Total Protein: 7 g/dL (ref 6.5–8.1)

## 2022-04-23 LAB — CBC WITH DIFFERENTIAL (CANCER CENTER ONLY)
Abs Immature Granulocytes: 0.02 10*3/uL (ref 0.00–0.07)
Basophils Absolute: 0 10*3/uL (ref 0.0–0.1)
Basophils Relative: 0 %
Eosinophils Absolute: 0.1 10*3/uL (ref 0.0–0.5)
Eosinophils Relative: 2 %
HCT: 34.5 % — ABNORMAL LOW (ref 39.0–52.0)
Hemoglobin: 11.9 g/dL — ABNORMAL LOW (ref 13.0–17.0)
Immature Granulocytes: 0 %
Lymphocytes Relative: 7 %
Lymphs Abs: 0.5 10*3/uL — ABNORMAL LOW (ref 0.7–4.0)
MCH: 32.2 pg (ref 26.0–34.0)
MCHC: 34.5 g/dL (ref 30.0–36.0)
MCV: 93.2 fL (ref 80.0–100.0)
Monocytes Absolute: 0.6 10*3/uL (ref 0.1–1.0)
Monocytes Relative: 8 %
Neutro Abs: 5.8 10*3/uL (ref 1.7–7.7)
Neutrophils Relative %: 83 %
Platelet Count: 151 10*3/uL (ref 150–400)
RBC: 3.7 MIL/uL — ABNORMAL LOW (ref 4.22–5.81)
RDW: 13.2 % (ref 11.5–15.5)
WBC Count: 7.1 10*3/uL (ref 4.0–10.5)
nRBC: 0 % (ref 0.0–0.2)

## 2022-04-23 MED ORDER — DIPHENHYDRAMINE HCL 50 MG/ML IJ SOLN
25.0000 mg | Freq: Once | INTRAMUSCULAR | Status: AC
  Administered 2022-04-23: 25 mg via INTRAVENOUS

## 2022-04-23 MED ORDER — MORPHINE SULFATE 15 MG PO TABS: 15.0000 mg | ORAL_TABLET | ORAL | 0 refills | Status: DC | PRN

## 2022-04-23 MED ORDER — SODIUM CHLORIDE 0.9% FLUSH
10.0000 mL | INTRAVENOUS | Status: DC | PRN
  Administered 2022-04-23: 10 mL

## 2022-04-23 MED ORDER — SODIUM CHLORIDE 0.9 % IV SOLN
200.0000 mg | Freq: Once | INTRAVENOUS | Status: AC
  Administered 2022-04-23: 200 mg via INTRAVENOUS
  Filled 2022-04-23: qty 200

## 2022-04-23 MED ORDER — FENTANYL 50 MCG/HR TD PT72: 1.0000 | MEDICATED_PATCH | TRANSDERMAL | 0 refills | Status: DC

## 2022-04-23 MED ORDER — SODIUM CHLORIDE 0.9 % IV SOLN: Freq: Once | INTRAVENOUS | Status: AC

## 2022-04-23 MED ORDER — FAMOTIDINE IN NACL 20-0.9 MG/50ML-% IV SOLN
20.0000 mg | Freq: Once | INTRAVENOUS | Status: AC
  Administered 2022-04-23: 20 mg via INTRAVENOUS

## 2022-04-23 MED ORDER — HEPARIN SOD (PORK) LOCK FLUSH 100 UNIT/ML IV SOLN
500.0000 [IU] | Freq: Once | INTRAVENOUS | Status: AC | PRN
  Administered 2022-04-23: 500 [IU]

## 2022-04-23 MED ORDER — FAMOTIDINE IN NACL 20-0.9 MG/50ML-% IV SOLN
INTRAVENOUS | Status: AC
  Filled 2022-04-23: qty 50

## 2022-04-23 MED ORDER — DIPHENHYDRAMINE HCL 50 MG/ML IJ SOLN
INTRAMUSCULAR | Status: AC
  Filled 2022-04-23: qty 1

## 2022-04-23 NOTE — Patient Instructions (Signed)
[facility] ONCOLOGY   Discharge Instructions: Thank you for choosing [facility] to provide your oncology and hematology care.   If you have a lab appointment with the [facility], please go directly to the [facility] and check in at the registration area.   Wear comfortable clothing and clothing appropriate for easy access to any Portacath or PICC line.   We strive to give you quality time with your provider. You may need to reschedule your appointment if you arrive late (15 or more minutes).  Arriving late affects you and other patients whose appointments are after yours.  Also, if you miss three or more appointments without notifying the office, you may be dismissed from the clinic at the provider's discretion.      For prescription refill requests, have your pharmacy contact our office and allow 72 hours for refills to be completed.    Today you received the following chemotherapy and/or immunotherapy agents: pembrolizumab      To help prevent nausea and vomiting after your treatment, we encourage you to take your nausea medication as directed.  BELOW ARE SYMPTOMS THAT SHOULD BE REPORTED IMMEDIATELY: *FEVER GREATER THAN 100.4 F (38 C) OR HIGHER *CHILLS OR SWEATING *NAUSEA AND VOMITING THAT IS NOT CONTROLLED WITH YOUR NAUSEA MEDICATION *UNUSUAL SHORTNESS OF BREATH *UNUSUAL BRUISING OR BLEEDING *URINARY PROBLEMS (pain or burning when urinating, or frequent urination) *BOWEL PROBLEMS (unusual diarrhea, constipation, pain near the anus) TENDERNESS IN MOUTH AND THROAT WITH OR WITHOUT PRESENCE OF ULCERS (sore throat, sores in mouth, or a toothache) UNUSUAL RASH, SWELLING OR PAIN  UNUSUAL VAGINAL DISCHARGE OR ITCHING   Items with * indicate a potential emergency and should be followed up as soon as possible or go to the Emergency Department if any problems should occur.  Please show the CHEMOTHERAPY ALERT CARD or IMMUNOTHERAPY ALERT CARD at  check-in to the Emergency Department and triage nurse.  Should you have questions after your visit or need to cancel or reschedule your appointment, please contact [facility]  Dept: [phone]  and follow the prompts.  Office hours are 8:00 a.m. to 4:30 p.m. Monday - Friday. Please note that voicemails left after 4:00 p.m. may not be returned until the following business day.  We are closed weekends and major holidays. You have access to a nurse at all times for urgent questions. Please call the main number to the clinic Dept: [phone] and follow the prompts.   For any non-urgent questions, you may also contact your provider using MyChart. We now offer e-Visits for anyone 2 and older to request care online for non-urgent symptoms. For details visit [email or web address].   Also download the MyChart app! Go to the app store, search "MyChart", open the app, select [facility], and log in with your MyChart username and password.  Masks are optional in the cancer centers. If you would like for your care team to wear a mask while they are taking care of you, please let them know. You may have one support person who is at least 75 years old accompany you for your appointments.

## 2022-04-27 ENCOUNTER — Other Ambulatory Visit: Payer: Self-pay | Admitting: Hematology

## 2022-04-28 ENCOUNTER — Encounter: Payer: Self-pay | Admitting: Hematology

## 2022-05-04 ENCOUNTER — Other Ambulatory Visit: Payer: Self-pay | Admitting: Hematology

## 2022-05-05 ENCOUNTER — Encounter: Payer: Self-pay | Admitting: Hematology

## 2022-05-06 ENCOUNTER — Telehealth: Payer: Self-pay | Admitting: Radiation Oncology

## 2022-05-06 NOTE — Telephone Encounter (Signed)
Called spouse to get patient scheduled for a consultation w. Dr. Tammi Klippel. No answer, LVM for a return call.

## 2022-05-13 ENCOUNTER — Other Ambulatory Visit: Payer: Self-pay

## 2022-05-13 DIAGNOSIS — C3492 Malignant neoplasm of unspecified part of left bronchus or lung: Secondary | ICD-10-CM

## 2022-05-13 NOTE — Progress Notes (Signed)
GU Location of Tumor / Histology: Prostate Ca/Bladder Ca (recurrence)  Primary:  Squamous cell lung cancer (left) Additional Dx:  Malignant neoplasm metastatic to bone  If Prostate Cancer, Gleason Score is (3 + 4) and PSA is (5.35 as of 01/2022)  Biopsies       11/15/2021 Dr. 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 Follow-up study.   IMPRESSION: 1. Chronic postradiation changes in the left lung with stable appearance of thick-walled cavitary area in the superior segment of the left lower lobe and chronic destructive changes in the adjacent ribs and thoracic spine, similar to the prior examination. 2. Previously noted areas of nodularity in the medial aspect of the right lower lobe has resolved, indicative of a benign infectious or inflammatory etiology on the prior study. 3. No new signs of metastatic disease in the thorax. 4. Aortic atherosclerosis, in addition to three-vessel coronary artery disease. Assessment for potential risk factor modification, dietary therapy or pharmacologic therapy may be warranted, if clinically indicated. 5. There are calcifications of the aortic valve. Echocardiographic correlation for evaluation of potential valvular dysfunction may be warranted if clinically indicated. Aortic Atherosclerosis (ICD10-I70.0) and Emphysema (ICD10-J43.9).   10/01/2021 Dr. Delice Bison CT Chest with Contrast CLINICAL DATA:  A 76 year old male presents for evaluation of non-small cell lung cancer, follow-up evaluation.  IMPRESSION: 1. Post treatment changes in the LEFT chest with stable air containing cavity in the posterior LEFT pleural space. This potentially represents a loculated bronchopleural fistula. 2. T5 and T6 destructive changes with pathologic fracture at T6 with similar appearance, adjacent soft tissue is unchanged. 3. New nodularity at the RIGHT lung base with  secretions in lower lobe bronchi. More nodular appearing area in the medial RIGHT lung base, findings suggest sequela of infection perhaps aspiration related. Given the more nodular appearance of medial RIGHT lower lobe process would suggest 6-8 week follow-up to ensure improvement/resolution. The possibility of neoplasm in this location is difficult to exclude, other scattered nodules have an appearance that is more suggestive of post infectious changes. 4. Aortic atherosclerosis, coronary artery disease and pulmonary emphysema as before. Aortic Atherosclerosis (ICD10-I70.0) and Emphysema (ICD10-J43.9).  Past/Anticipated interventions by urology, if any: NA  Past/Anticipated interventions by medical oncology, if any: NA  Weight changes, if any: No  IPPS:  11 SHIM:  5  Bowel/Bladder complaints, if any:  No  Nausea/Vomiting, if any: No  Pain issues, if any:  0/10  SAFETY ISSUES: Prior radiation? Yes, Left Lung cancer  (10/2018). Pacemaker/ICD? No Possible current pregnancy? Male Is the patient on methotrexate? No  Current Complaints / other details:  Port-A-Cath (right side)

## 2022-05-14 ENCOUNTER — Other Ambulatory Visit: Payer: Self-pay

## 2022-05-14 ENCOUNTER — Inpatient Hospital Stay: Payer: No Typology Code available for payment source

## 2022-05-14 ENCOUNTER — Inpatient Hospital Stay (HOSPITAL_BASED_OUTPATIENT_CLINIC_OR_DEPARTMENT_OTHER): Payer: No Typology Code available for payment source | Admitting: Hematology

## 2022-05-14 ENCOUNTER — Other Ambulatory Visit: Payer: No Typology Code available for payment source

## 2022-05-14 ENCOUNTER — Inpatient Hospital Stay: Payer: No Typology Code available for payment source | Attending: Hematology

## 2022-05-14 VITALS — BP 127/64 | HR 64 | Temp 98.2°F | Resp 15 | Wt 126.5 lb

## 2022-05-14 DIAGNOSIS — C7951 Secondary malignant neoplasm of bone: Secondary | ICD-10-CM

## 2022-05-14 DIAGNOSIS — F17210 Nicotine dependence, cigarettes, uncomplicated: Secondary | ICD-10-CM | POA: Diagnosis not present

## 2022-05-14 DIAGNOSIS — C3492 Malignant neoplasm of unspecified part of left bronchus or lung: Secondary | ICD-10-CM

## 2022-05-14 DIAGNOSIS — Z8551 Personal history of malignant neoplasm of bladder: Secondary | ICD-10-CM | POA: Insufficient documentation

## 2022-05-14 DIAGNOSIS — C61 Malignant neoplasm of prostate: Secondary | ICD-10-CM | POA: Diagnosis not present

## 2022-05-14 DIAGNOSIS — G893 Neoplasm related pain (acute) (chronic): Secondary | ICD-10-CM | POA: Diagnosis not present

## 2022-05-14 DIAGNOSIS — Z5112 Encounter for antineoplastic immunotherapy: Secondary | ICD-10-CM

## 2022-05-14 DIAGNOSIS — C3412 Malignant neoplasm of upper lobe, left bronchus or lung: Secondary | ICD-10-CM | POA: Insufficient documentation

## 2022-05-14 DIAGNOSIS — Z79899 Other long term (current) drug therapy: Secondary | ICD-10-CM | POA: Insufficient documentation

## 2022-05-14 DIAGNOSIS — Z7189 Other specified counseling: Secondary | ICD-10-CM

## 2022-05-14 DIAGNOSIS — Z95828 Presence of other vascular implants and grafts: Secondary | ICD-10-CM

## 2022-05-14 LAB — CBC WITH DIFFERENTIAL (CANCER CENTER ONLY)
Abs Immature Granulocytes: 0.03 10*3/uL (ref 0.00–0.07)
Basophils Absolute: 0 10*3/uL (ref 0.0–0.1)
Basophils Relative: 0 %
Eosinophils Absolute: 0.2 10*3/uL (ref 0.0–0.5)
Eosinophils Relative: 2 %
HCT: 35.4 % — ABNORMAL LOW (ref 39.0–52.0)
Hemoglobin: 12.3 g/dL — ABNORMAL LOW (ref 13.0–17.0)
Immature Granulocytes: 0 %
Lymphocytes Relative: 8 %
Lymphs Abs: 0.6 10*3/uL — ABNORMAL LOW (ref 0.7–4.0)
MCH: 32.3 pg (ref 26.0–34.0)
MCHC: 34.7 g/dL (ref 30.0–36.0)
MCV: 92.9 fL (ref 80.0–100.0)
Monocytes Absolute: 0.6 10*3/uL (ref 0.1–1.0)
Monocytes Relative: 8 %
Neutro Abs: 6.1 10*3/uL (ref 1.7–7.7)
Neutrophils Relative %: 82 %
Platelet Count: 170 10*3/uL (ref 150–400)
RBC: 3.81 MIL/uL — ABNORMAL LOW (ref 4.22–5.81)
RDW: 13.2 % (ref 11.5–15.5)
WBC Count: 7.5 10*3/uL (ref 4.0–10.5)
nRBC: 0 % (ref 0.0–0.2)

## 2022-05-14 LAB — TSH: TSH: 1.706 u[IU]/mL (ref 0.350–4.500)

## 2022-05-14 LAB — CMP (CANCER CENTER ONLY)
ALT: 14 U/L (ref 0–44)
AST: 21 U/L (ref 15–41)
Albumin: 4 g/dL (ref 3.5–5.0)
Alkaline Phosphatase: 68 U/L (ref 38–126)
Anion gap: 5 (ref 5–15)
BUN: 26 mg/dL — ABNORMAL HIGH (ref 8–23)
CO2: 32 mmol/L (ref 22–32)
Calcium: 10.2 mg/dL (ref 8.9–10.3)
Chloride: 102 mmol/L (ref 98–111)
Creatinine: 1.15 mg/dL (ref 0.61–1.24)
GFR, Estimated: >60 mL/min (ref >60)
Glucose, Bld: 107 mg/dL — ABNORMAL HIGH (ref 70–99)
Potassium: 4.1 mmol/L (ref 3.5–5.1)
Sodium: 139 mmol/L (ref 135–145)
Total Bilirubin: 0.5 mg/dL (ref 0.3–1.2)
Total Protein: 7 g/dL (ref 6.5–8.1)

## 2022-05-14 MED ORDER — SODIUM CHLORIDE 0.9% FLUSH
10.0000 mL | INTRAVENOUS | Status: DC | PRN
  Administered 2022-05-14: 10 mL

## 2022-05-14 MED ORDER — DIPHENHYDRAMINE HCL 50 MG/ML IJ SOLN
25.0000 mg | Freq: Once | INTRAMUSCULAR | Status: AC
  Administered 2022-05-14: 25 mg via INTRAVENOUS
  Filled 2022-05-14: qty 1

## 2022-05-14 MED ORDER — SODIUM CHLORIDE 0.9 % IV SOLN
200.0000 mg | Freq: Once | INTRAVENOUS | Status: AC
  Administered 2022-05-14: 200 mg via INTRAVENOUS
  Filled 2022-05-14: qty 200

## 2022-05-14 MED ORDER — HEPARIN SOD (PORK) LOCK FLUSH 100 UNIT/ML IV SOLN
500.0000 [IU] | Freq: Once | INTRAVENOUS | Status: AC | PRN
  Administered 2022-05-14: 500 [IU]

## 2022-05-14 MED ORDER — FAMOTIDINE IN NACL 20-0.9 MG/50ML-% IV SOLN
20.0000 mg | Freq: Once | INTRAVENOUS | Status: AC
  Administered 2022-05-14: 20 mg via INTRAVENOUS
  Filled 2022-05-14: qty 50

## 2022-05-14 MED ORDER — SODIUM CHLORIDE 0.9 % IV SOLN: Freq: Once | INTRAVENOUS | Status: AC

## 2022-05-14 NOTE — Patient Instructions (Signed)
[facility]  Discharge Instructions: Thank you for choosing [facility] to provide your oncology and hematology care.   If you have a lab appointment with the [facility], please go directly to the [facility] and check in at the registration area.   Wear comfortable clothing and clothing appropriate for easy access to any Portacath or PICC line.   We strive to give you quality time with your provider. You may need to reschedule your appointment if you arrive late (15 or more minutes).  Arriving late affects you and other patients whose appointments are after yours.  Also, if you miss three or more appointments without notifying the office, you may be dismissed from the clinic at the provider's discretion.      For prescription refill requests, have your pharmacy contact our office and allow 72 hours for refills to be completed.    Today you received the following chemotherapy and/or immunotherapy agents: pembrolizumab      To help prevent nausea and vomiting after your treatment, we encourage you to take your nausea medication as directed.  BELOW ARE SYMPTOMS THAT SHOULD BE REPORTED IMMEDIATELY: *FEVER GREATER THAN 100.4 F (38 C) OR HIGHER *CHILLS OR SWEATING *NAUSEA AND VOMITING THAT IS NOT CONTROLLED WITH YOUR NAUSEA MEDICATION *UNUSUAL SHORTNESS OF BREATH *UNUSUAL BRUISING OR BLEEDING *URINARY PROBLEMS (pain or burning when urinating, or frequent urination) *BOWEL PROBLEMS (unusual diarrhea, constipation, pain near the anus) TENDERNESS IN MOUTH AND THROAT WITH OR WITHOUT PRESENCE OF ULCERS (sore throat, sores in mouth, or a toothache) UNUSUAL RASH, SWELLING OR PAIN  UNUSUAL VAGINAL DISCHARGE OR ITCHING   Items with * indicate a potential emergency and should be followed up as soon as possible or go to the Emergency Department if any problems should occur.  Please show the CHEMOTHERAPY ALERT CARD or IMMUNOTHERAPY ALERT CARD at check-in  to the Emergency Department and triage nurse.  Should you have questions after your visit or need to cancel or reschedule your appointment, please contact [facility]  Dept: [phone]  and follow the prompts.  Office hours are 8:00 a.m. to 4:30 p.m. Monday - Friday. Please note that voicemails left after 4:00 p.m. may not be returned until the following business day.  We are closed weekends and major holidays. You have access to a nurse at all times for urgent questions. Please call the main number to the clinic Dept: [phone] and follow the prompts.   For any non-urgent questions, you may also contact your provider using MyChart. We now offer e-Visits for anyone 64 and older to request care online for non-urgent symptoms. For details visit [email or web address].   Also download the MyChart app! Go to the app store, search "MyChart", open the app, select , and log in with your MyChart username and password.  Masks are optional in the cancer centers. If you would like for your care team to wear a mask while they are taking care of you, please let them know. You may have one support person who is at least 75 years old accompany you for your appointments.

## 2022-05-15 ENCOUNTER — Other Ambulatory Visit: Payer: Self-pay

## 2022-05-15 ENCOUNTER — Ambulatory Visit
Admission: RE | Admit: 2022-05-15 | Discharge: 2022-05-15 | Disposition: A | Discharge status: Home / Self Care | Payer: Medicare Other | Source: Ambulatory Visit | Attending: Radiation Oncology | Admitting: Radiation Oncology

## 2022-05-15 DIAGNOSIS — E785 Hyperlipidemia, unspecified: Secondary | ICD-10-CM | POA: Insufficient documentation

## 2022-05-15 DIAGNOSIS — I10 Essential (primary) hypertension: Secondary | ICD-10-CM | POA: Diagnosis not present

## 2022-05-15 DIAGNOSIS — Z87442 Personal history of urinary calculi: Secondary | ICD-10-CM | POA: Insufficient documentation

## 2022-05-15 DIAGNOSIS — I252 Old myocardial infarction: Secondary | ICD-10-CM | POA: Diagnosis not present

## 2022-05-15 DIAGNOSIS — Z85118 Personal history of other malignant neoplasm of bronchus and lung: Secondary | ICD-10-CM | POA: Insufficient documentation

## 2022-05-15 DIAGNOSIS — Z7902 Long term (current) use of antithrombotics/antiplatelets: Secondary | ICD-10-CM | POA: Insufficient documentation

## 2022-05-15 DIAGNOSIS — I2510 Atherosclerotic heart disease of native coronary artery without angina pectoris: Secondary | ICD-10-CM | POA: Diagnosis not present

## 2022-05-15 DIAGNOSIS — K219 Gastro-esophageal reflux disease without esophagitis: Secondary | ICD-10-CM | POA: Diagnosis not present

## 2022-05-15 DIAGNOSIS — Z79899 Other long term (current) drug therapy: Secondary | ICD-10-CM | POA: Insufficient documentation

## 2022-05-15 DIAGNOSIS — F17210 Nicotine dependence, cigarettes, uncomplicated: Secondary | ICD-10-CM | POA: Insufficient documentation

## 2022-05-15 DIAGNOSIS — Z801 Family history of malignant neoplasm of trachea, bronchus and lung: Secondary | ICD-10-CM | POA: Diagnosis not present

## 2022-05-15 DIAGNOSIS — Z8551 Personal history of malignant neoplasm of bladder: Secondary | ICD-10-CM | POA: Diagnosis not present

## 2022-05-15 DIAGNOSIS — Z923 Personal history of irradiation: Secondary | ICD-10-CM | POA: Diagnosis not present

## 2022-05-15 DIAGNOSIS — C61 Malignant neoplasm of prostate: Secondary | ICD-10-CM

## 2022-05-15 DIAGNOSIS — Z85828 Personal history of other malignant neoplasm of skin: Secondary | ICD-10-CM | POA: Diagnosis not present

## 2022-05-15 NOTE — Progress Notes (Signed)
Introduced myself to the patient as the prostate nurse navigator.  No barriers to care identified at this time.  He is here to discuss his radiation treatment options.  I gave him my business card and asked him to call me with questions or concerns.  Verbalized understanding.  ?

## 2022-05-15 NOTE — Progress Notes (Signed)
Radiation Oncology         [phone] ________________________________  Initial Outpatient Consultation  Name: Kyle Foster MRN: [id number]  Date: 05/15/2022  DOB: 1946/04/24  CC:Kyle Coss, NP  Kyle Aloe, MD   REFERRING PHYSICIAN: [name], MD  DIAGNOSIS: 76 y.o. 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    ICD-10-CM   1. Malignant neoplasm of prostate ([place])  C61       HISTORY OF PRESENT ILLNESS: Kyle Foster is a 75 y.o. male with a new diagnosis of prostate cancer. He is well known to our service, having previously completed a 2 week course of palliative radiation to the thoracic spine in 10/2018 for painful metastatic disease associated with his Stage IV NSCLC. His lung cancer is managed by Dr. Irene Foster and he remains on maintenance Keytruda and Xgeva without evidence of disease progression on restaging scans. He also has a longstanding history of BPH and bladder cancer, followed by both the [facility] and Dr. Junious Foster. 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 He did have a recurrence of the bladder cancer in 04/2021 that was again treated with TURBT and Gemcitabine bladder installation. He has continued in observation since that time, with a negative cystoscopy in 12/2021. At the time of his office visit in April 2023, he was noted to have a new prostate nodule on the left that was not present on exam in 12/2020 when his PSA was 3.19. He was noted to have a nodular, hypermetabolic prostate on PET scan in 11/2020 but exam was normal at that time and PSA in the normal range so he opted to continue to monitor the PSA. More recently, in May 2023, he was noted to have an elevated PSA of 5.35 so he proceeded to transrectal ultrasound with 12 biopsies of the prostate on 04/10/22.  The prostate volume measured 24.59 cc.  Out of 12 core biopsies, 6 were  positive.  The maximum Gleason score was 4+3, and this was seen in the left apex. Additionally, there was Gleason 3+4 in the left base lateral, left mid, left mid lateral and left apex lateral and Gleason 3+3 in the right base lateral.  The patient reviewed the biopsy results with his urologist and he has kindly been referred today for discussion of potential radiation treatment options. His wife, Kyle Foster, is accompanying him today.   PREVIOUS RADIATION THERAPY: Yes  10/21/2018 - 11/03/2018:  Spine, Thoracic / 30 Gy delivered in 10 fractions of 3 Gy  02/18/2010 - 03/15/2010: Head and neck (adjuvant XRT s/p left Parotidectomy) patient received 3800 out of a planned 6600 cGy and discontinued treatment early due to severe toxicity. 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 immunotherapy      PAST SURGICAL HISTORY: Past Surgical History:  Procedure Laterality Date   CATARACT EXTRACTION W/ INTRAOCULAR LENS IMPLANT Right 12/2007   CORONARY ANGIOPLASTY WITH STENT PLACEMENT  06/2008   "3 stents" (06/14/2018)   CYSTOSCOPY W/ RETROGRADES  09/22/2011   Procedure: CYSTOSCOPY WITH RETROGRADE PYELOGRAM;  Surgeon: Kyle Amass, MD;  Location: [facility];  Service: Urology;  Laterality: Left;  Cystoscopy left Retrograde Pyelogram      (c-arm)    CYSTOSCOPY WITH BIOPSY  09/22/2011    Procedure: CYSTOSCOPY WITH BIOPSY;  Surgeon: Kyle Amass, MD;  Location: [facility];  Service: Urology;  Laterality: N/A;   Biopsy   CYSTOSCOPY WITH BIOPSY N/A 04/19/2021   Procedure: CYSTOSCOPY WITH BLADDER  BIOPSY WITH FULGERATION;  Surgeon: Kyle Aloe, MD;  Location: [facility];  Service: Urology;  Laterality: N/A;   EXCISIONAL HEMORRHOIDECTOMY  ~ 2006   EYE SURGERY Left 04/2011   "reconstruction; gold weight in eye lid " (06/14/2018)   FOOT NEUROMA SURGERY Left    immunotherapy     INGUINAL HERNIA REPAIR Right 02/2018   IR IMAGING GUIDED PORT INSERTION  11/23/2018   NM MYOCAR PERF WALL MOTION  07/11/2008   MILD ISCHEMIA IN THE BASL INFERIOR, MID INFERIOR & APICAL INFERIOR REGIONS   SALIVARY GLAND SURGERY Left 04/2011   "squamous cell cancer attached to it; took the gland out"   SKIN CANCER EXCISION     "arms, hands, face, neck" (06/14/2018)   TRANSURETHRAL RESECTION OF BLADDER TUMOR WITH GYRUS (TURBT-GYRUS)  2010    FAMILY HISTORY:  Family History  Problem Relation Age of Onset   Heart attack Mother 90   Cancer Mother        Lung   Diabetes Mother    Heart disease Mother    Hypertension Mother    Stroke Father 19   Cancer Father        Lung   Brain cancer Brother    Cancer Sister        Melanoma of great Toe   Hyperlipidemia Sister     SOCIAL HISTORY:  Social History   Socioeconomic History   Marital status: Married    Spouse name: Not on file   Number of children: Not on file   Years of education: Not on file   Highest education level: Not on file  Occupational History   Not on file  Tobacco Use   Smoking status: Every Day    Packs/day: 0.50    Years: 52.00    Total pack years: 26.00    Types: Cigarettes    Start date: 1968   Smokeless tobacco: Never   Tobacco comments:    03/21/19- smoking 10 cigs a day   Vaping Use   Vaping Use: Never used  Substance and Sexual Activity   Alcohol use: Never   Drug use: Never   Sexual activity: Not on file  Other  Topics Concern   Not on file  Social History Narrative   Not on file   Social Determinants of Health   Financial Resource Strain: Not on file  Food Insecurity: Not on file  Transportation Needs: Not on file  Physical Activity: Not on file  Stress: Not on file  Social Connections: Not on file  Intimate Partner Violence: Not on file    ALLERGIES: Codeine  MEDICATIONS:  Current Outpatient Medications  Medication Sig Dispense Refill   acetaminophen (TYLENOL)  500 MG tablet Take 1,000 mg by mouth every 8 (eight) hours as needed for moderate pain.     acetic acid-hydrocortisone (VOSOL-HC) OTIC solution Place 3 drops into the left ear 3 (three) times daily as needed. 10 mL 0   atorvastatin (LIPITOR) 80 MG tablet Take 1 tablet (80 mg total) by mouth daily. 90 tablet 2   B Complex-C (B-COMPLEX WITH VITAMIN C) tablet Take 1 tablet by mouth daily.     calcium carbonate (TUMS - DOSED IN MG ELEMENTAL CALCIUM) 500 MG chewable tablet Chew 1 tablet (200 mg of elemental calcium total) by mouth 3 (three) times daily with meals. (Patient taking differently: Chew 1 tablet by mouth 3 (three) times daily as needed for indigestion.) 30 tablet 3   Cholecalciferol (VITAMIN D) 2000 UNITS tablet Take 2,000 Units by mouth daily.     CIPRODEX OTIC suspension SMARTSIG:In Ear(s)     clopidogrel (PLAVIX) 75 MG tablet Take 1 tablet (75 mg total) by mouth daily. NEEDS APPOINTMENT FOR FUTURE REFILLS 90 tablet 3   DENTA 5000 PLUS 1.1 % CREA dental cream Place 1 application onto teeth at bedtime.     doxycycline (VIBRA-TABS) 100 MG tablet Take 1 tablet (100 mg total) by mouth 2 (two) times daily. 20 tablet 0   DULoxetine (CYMBALTA) 30 MG capsule TAKE 1 CAPSULE BY MOUTH EVERY DAY 90 capsule 2   fentaNYL (DURAGESIC) 50 MCG/HR Place 1 patch onto the skin every 3 (three) days. 10 patch 0   Hypromellose (ARTIFICIAL TEARS OP) Place 1 drop into both eyes at bedtime.      ipratropium-albuterol (DUONEB) 0.5-2.5 (3) MG/3ML SOLN  Take 3 mLs by nebulization every 6 (six) hours as needed. Start with using nebulizer after awakening in the morning, mid-day, and then prior to bedtime for the first week-10 days then back off to as needed as congestion and wheezing improves. 360 mL 0   lidocaine-prilocaine (EMLA) cream Apply 1 application topically as needed (access port). 30 g 3   loratadine (CLARITIN) 10 MG tablet Take 10 mg by mouth daily as needed for allergies.     magnesium oxide (MAG-OX) 400 MG tablet Take 1 tablet (400 mg total) by mouth daily. 90 tablet 0   metoprolol tartrate (LOPRESSOR) 25 MG tablet TAKE 1 TABLET BY MOUTH TWICE A DAY 180 tablet 2   morphine (MSIR) 15 MG tablet Take 1-2 tablets (15-30 mg total) by mouth every 4 (four) hours as needed for moderate pain or severe pain. 90 tablet 0   ondansetron (ZOFRAN) 8 MG tablet Take 1 tablet (8 mg total) by mouth every 8 (eight) hours as needed for nausea or vomiting. 30 tablet 2   pantoprazole (PROTONIX) 40 MG tablet TAKE 1 TABLET BY MOUTH EVERY DAY 90 tablet 3   penicillin v potassium (VEETID) 500 MG tablet Take 500 mg by mouth 3 (three) times daily.     TRELEGY ELLIPTA 100-62.5-25 MCG/ACT AEPB INHALE 1 PUFF BY MOUTH EVERY DAY 60 each 1   No current facility-administered medications for this visit.    REVIEW OF SYSTEMS:  On review of systems, the patient reports that he is doing well overall. He denies any chest pain, shortness of breath, cough, fevers, chills, night sweats, unintended weight changes. He denies any bowel disturbances, and denies abdominal pain, nausea or vomiting. He denies any new musculoskeletal or joint aches or pains. His IPSS was 11, indicating moderate urinary symptoms with frequency, weak flow and intermittency. His SHIM was 5, indicating he has  severe erectile dysfunction but this is not a high priority for him. A complete review of systems is obtained and is otherwise negative.    PHYSICAL EXAM:  Wt Readings from Last 3 Encounters:   05/14/22 126 lb 8 oz (57.4 kg)  04/23/22 128 lb (58.1 kg)  04/02/22 127 lb 6.4 oz (57.8 kg)   Temp Readings from Last 3 Encounters:  05/14/22 98.2 F (36.8 C) (Oral)  04/23/22 98.2 F (36.8 C) (Oral)  04/02/22 (!) 97.3 F (36.3 C)   BP Readings from Last 3 Encounters:  05/14/22 127/64  04/23/22 (!) 103/58  04/02/22 (!) 104/51   Pulse Readings from Last 3 Encounters:  05/14/22 64  04/23/22 60  04/02/22 60    /10  In general this is a well appearing Caucasian male in no acute distress. He's alert and oriented x4 and appropriate throughout the examination. Cardiopulmonary assessment is negative for acute distress, and he exhibits normal effort.     KPS = 90  100 - Normal; no complaints; no evidence of disease. 90   - Able to carry on normal activity; minor signs or symptoms of disease. 80   - Normal activity with effort; some signs or symptoms of disease. 26   - Cares for self; unable to carry on normal activity or to do active work. 60   - Requires occasional assistance, but is able to care for most of his personal needs. 50   - Requires considerable assistance and frequent medical care. 74   - Disabled; requires special care and assistance. 73   - Severely disabled; hospital admission is indicated although death not imminent. 2   - Very sick; hospital admission necessary; active supportive treatment necessary. 10   - Moribund; fatal processes progressing rapidly. 0     - Dead  Karnofsky DA, Abelmann [place], Craver LS and Burchenal [facility] [phone]) The use of the nitrogen mustards in the palliative treatment of carcinoma: with particular reference to bronchogenic carcinoma Cancer 1 634-56  LABORATORY DATA:  Lab Results  Component Value Date   WBC 7.5 05/14/2022   HGB 12.3 (L) 05/14/2022   HCT 35.4 (L) 05/14/2022   MCV 92.9 05/14/2022   PLT 170 05/14/2022   Lab Results  Component Value Date   NA 139 05/14/2022   K 4.1 05/14/2022   CL 102 05/14/2022   CO2 32 05/14/2022    Lab Results  Component Value Date   ALT 14 05/14/2022   AST 21 05/14/2022   ALKPHOS 68 05/14/2022   BILITOT 0.5 05/14/2022     RADIOGRAPHY: No results found.    IMPRESSION/PLAN: 1. 76 y.o. gentleman with Stage T2b adenocarcinoma of the prostate with Gleason Score of 4+3, and PSA of 5.35. We discussed the patient's workup and outlined the nature of prostate cancer in this setting. The patient's T stage, Gleason's score, and PSA put him into the unfavorable intermediate risk group. Accordingly, he is eligible for a variety of potential treatment options including brachytherapy, 5.5 weeks of external radiation, or prostatectomy. We discussed the available radiation techniques, and focused on the details and logistics of delivery. We discussed and outlined the risks, benefits, short and long-term effects associated with radiotherapy and compared and contrasted these with prostatectomy. We discussed the role of SpaceOAR gel in reducing the rectal toxicity associated with radiotherapy. He and his wife were encouraged to ask questions that were answered to their stated satisfaction.  At the conclusion of our conversation, the patient is interested in moving forward  with 5.5 weeks of external beam therapy. We will share our discussion with Dr. Junious Foster and make arrangements for fiducial markers and SpaceOAR gel placement, first available, prior to simulation, to reduce rectal toxicity from radiotherapy. The patient appears to have a good understanding of his disease and our treatment recommendations which are of curative intent and is in agreement with the stated plan.  Therefore, we will move forward with treatment planning accordingly, in anticipation of beginning IMRT in the near future.  We personally spent 60 minutes in this encounter including chart review, reviewing radiological studies, meeting face-to-face with the patient, entering orders and completing documentation.    Nicholos Johns,  PA-C    Tyler Pita, MD  [facility] Direct Dial: [phone]  Fax: [phone] [email or web address]  Skype  LinkedIn
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
## 2022-05-20 ENCOUNTER — Encounter: Payer: Self-pay | Admitting: Hematology

## 2022-05-22 ENCOUNTER — Other Ambulatory Visit: Payer: Self-pay

## 2022-05-22 ENCOUNTER — Other Ambulatory Visit: Payer: Self-pay | Admitting: Cardiovascular Disease

## 2022-05-22 DIAGNOSIS — C3492 Malignant neoplasm of unspecified part of left bronchus or lung: Secondary | ICD-10-CM

## 2022-05-23 ENCOUNTER — Telehealth: Payer: Self-pay | Admitting: *Deleted

## 2022-05-23 ENCOUNTER — Telehealth: Payer: Self-pay | Admitting: Cardiovascular Disease

## 2022-05-23 ENCOUNTER — Other Ambulatory Visit: Payer: Self-pay | Admitting: Urology

## 2022-05-23 ENCOUNTER — Encounter: Payer: Self-pay | Admitting: Hematology

## 2022-05-23 MED ORDER — FENTANYL 50 MCG/HR TD PT72: 1.0000 | MEDICATED_PATCH | TRANSDERMAL | 0 refills | Status: DC

## 2022-05-23 MED ORDER — MORPHINE SULFATE 15 MG PO TABS: 15.0000 mg | ORAL_TABLET | ORAL | 0 refills | Status: DC | PRN

## 2022-05-23 NOTE — Telephone Encounter (Signed)
CALLED PATIENT TO INFORM OF FID. MARKER AND SPACE OAR PLACEMENT ON 07-08-22 AND HIS SIM ON 07-11-22- ARRIVAL TIME- 8:45 AM @ [facility], INFORMED PATIENT TO ARRIVE WITH A FULL BLADDER, LVM FOR A RETURN CALL

## 2022-05-23 NOTE — Telephone Encounter (Signed)
Returned patient's wife's phone call Lenna Sciara), spoke with her and informed of her husband's fid. markers and space oar  placement on 07-08-22 and his sim on 07-11-22- arrival time- 8:45 am @ [facility], informed patient's wife Lenna Sciara) to have her husband to arrive with a full bladder, patient's wife verified understanding these appts. and the instructions

## 2022-05-23 NOTE — Telephone Encounter (Signed)
   Pre-operative Risk Assessment    Patient Name: Kyle Foster  DOB: 10/26/1945 MRN: [id number]      Request for Surgical Clearance    Procedure:   Fiducial Markers and Space Oar Gel  Date of Surgery:  Clearance 07/08/22                                 Surgeon:  Dr. Gloriann Loan Surgeon's Group or Practice Name:  [facility] Phone number:  [phone] Fax number:  [phone]   Type of Clearance Requested:   - Pharmacy:  Hold Clopidogrel (Plavix)     Type of Anesthesia:  MAC   Additional requests/questions:  Please advise surgeon/provider what medications should be held.  Signed, Belisicia T Harris   05/23/2022, 9:24 AM

## 2022-05-24 ENCOUNTER — Other Ambulatory Visit: Payer: Self-pay

## 2022-05-29 NOTE — Telephone Encounter (Signed)
   Name: Kyle Foster  DOB: 04/21/1946  MRN: [id number]  Primary Cardiologist: Quay Burow, MD  Chart reviewed as part of pre-operative protocol coverage. Because of [name]'s past medical history and time since last visit, he will require a follow-up telephone visit in order to better assess preoperative cardiovascular risk.  Pre-op covering staff: - Please schedule appointment and call patient to inform them. If patient already had an upcoming appointment within acceptable timeframe, please add "pre-op clearance" to the appointment notes so provider is aware. - Please contact requesting surgeon's office via preferred method (i.e, phone, fax) to inform them of need for appointment prior to surgery.  He has a history of CAD status post non-ST-segment elevation myocardial infarction June 30, 2008. They catheterized him and stented his AV-groove circumflex and marginal branch bifurcation with Driver bare-metal stents. He did have tandem 70% lesions in his RCA and normal LV function.  Per our office protocol, okay to hold Plavix x5 days prior to the procedure as long as patient is not symptomatic.  Please resume medically safe to do so  Elgie Collard, PA-C  05/29/2022, 11:41 AM

## 2022-05-29 NOTE — Telephone Encounter (Signed)
Call placed to pt regarding surgical clearance and the need for a tele visit.  Left pt a message to call back and ask for the preop team.

## 2022-05-30 ENCOUNTER — Telehealth: Payer: Self-pay | Admitting: *Deleted

## 2022-05-30 NOTE — Telephone Encounter (Signed)
Pt agreeable to plan of care for tele pre op appt 06/30/22 @ 2 pm .  Med rec and consent are done.      Patient Consent for Virtual Visit        Kyle Foster has provided verbal consent on 05/30/2022 for a virtual visit (video or telephone).   CONSENT FOR VIRTUAL VISIT FOR:  Kyle Foster  By participating in this virtual visit I agree to the following:  I hereby voluntarily request, consent and authorize [facility] and its employed or contracted physicians, physician assistants, nurse practitioners or other licensed health care professionals (the Practitioner), to provide me with telemedicine health care services (the "Services") as deemed necessary by the treating Practitioner. I acknowledge and consent to receive the Services by the Practitioner via telemedicine. I understand that the telemedicine visit will involve communicating with the Practitioner through live audiovisual communication technology and the disclosure of certain medical information by electronic transmission. I acknowledge that I have been given the opportunity to request an in-person assessment or other available alternative prior to the telemedicine visit and am voluntarily participating in the telemedicine visit.  I understand that I have the right to withhold or withdraw my consent to the use of telemedicine in the course of my care at any time, without affecting my right to future care or treatment, and that the Practitioner or I may terminate the telemedicine visit at any time. I understand that I have the right to inspect all information obtained and/or recorded in the course of the telemedicine visit and may receive copies of available information for a reasonable fee.  I understand that some of the potential risks of receiving the Services via telemedicine include:  Delay or interruption in medical evaluation due to technological equipment failure or disruption; Information transmitted may not be  sufficient (e.g. poor resolution of images) to allow for appropriate medical decision making by the Practitioner; and/or  In rare instances, security protocols could fail, causing a breach of personal health information.  Furthermore, I acknowledge that it is my responsibility to provide information about my medical history, conditions and care that is complete and accurate to the best of my ability. I acknowledge that Practitioner's advice, recommendations, and/or decision may be based on factors not within their control, such as incomplete or inaccurate data provided by me or distortions of diagnostic images or specimens that may result from electronic transmissions. I understand that the practice of medicine is not an exact science and that Practitioner makes no warranties or guarantees regarding treatment outcomes. I acknowledge that a copy of this consent can be made available to me via my patient portal ([facility]), or I can request a printed copy by calling the office of [facility].    I understand that my insurance will be billed for this visit.   I have read or had this consent read to me. I understand the contents of this consent, which adequately explains the benefits and risks of the Services being provided via telemedicine.  I have been provided ample opportunity to ask questions regarding this consent and the Services and have had my questions answered to my satisfaction. I give my informed consent for the services to be provided through the use of telemedicine in my medical care

## 2022-05-30 NOTE — Telephone Encounter (Signed)
Pt agreeable to plan of care for tele pre op appt 06/30/22 @ 2 pm .  Med rec and consent are done.

## 2022-06-03 ENCOUNTER — Other Ambulatory Visit: Payer: Self-pay

## 2022-06-03 DIAGNOSIS — C3492 Malignant neoplasm of unspecified part of left bronchus or lung: Secondary | ICD-10-CM

## 2022-06-04 ENCOUNTER — Inpatient Hospital Stay: Payer: No Typology Code available for payment source

## 2022-06-04 ENCOUNTER — Other Ambulatory Visit: Payer: Self-pay

## 2022-06-04 VITALS — BP 102/53 | HR 60 | Temp 98.3°F | Resp 18

## 2022-06-04 DIAGNOSIS — C3412 Malignant neoplasm of upper lobe, left bronchus or lung: Secondary | ICD-10-CM

## 2022-06-04 DIAGNOSIS — C3492 Malignant neoplasm of unspecified part of left bronchus or lung: Secondary | ICD-10-CM

## 2022-06-04 DIAGNOSIS — Z7189 Other specified counseling: Secondary | ICD-10-CM

## 2022-06-04 DIAGNOSIS — C3490 Malignant neoplasm of unspecified part of unspecified bronchus or lung: Secondary | ICD-10-CM

## 2022-06-04 DIAGNOSIS — C7951 Secondary malignant neoplasm of bone: Secondary | ICD-10-CM

## 2022-06-04 DIAGNOSIS — Z5112 Encounter for antineoplastic immunotherapy: Secondary | ICD-10-CM | POA: Diagnosis not present

## 2022-06-04 DIAGNOSIS — Z95828 Presence of other vascular implants and grafts: Secondary | ICD-10-CM

## 2022-06-04 LAB — CBC WITH DIFFERENTIAL (CANCER CENTER ONLY)
Abs Immature Granulocytes: 0.02 10*3/uL (ref 0.00–0.07)
Basophils Absolute: 0 10*3/uL (ref 0.0–0.1)
Basophils Relative: 0 %
Eosinophils Absolute: 0.2 10*3/uL (ref 0.0–0.5)
Eosinophils Relative: 2 %
HCT: 33.7 % — ABNORMAL LOW (ref 39.0–52.0)
Hemoglobin: 11.6 g/dL — ABNORMAL LOW (ref 13.0–17.0)
Immature Granulocytes: 0 %
Lymphocytes Relative: 8 %
Lymphs Abs: 0.5 10*3/uL — ABNORMAL LOW (ref 0.7–4.0)
MCH: 32 pg (ref 26.0–34.0)
MCHC: 34.4 g/dL (ref 30.0–36.0)
MCV: 92.8 fL (ref 80.0–100.0)
Monocytes Absolute: 0.7 10*3/uL (ref 0.1–1.0)
Monocytes Relative: 10 %
Neutro Abs: 5.4 10*3/uL (ref 1.7–7.7)
Neutrophils Relative %: 80 %
Platelet Count: 162 10*3/uL (ref 150–400)
RBC: 3.63 MIL/uL — ABNORMAL LOW (ref 4.22–5.81)
RDW: 12.8 % (ref 11.5–15.5)
WBC Count: 6.8 10*3/uL (ref 4.0–10.5)
nRBC: 0 % (ref 0.0–0.2)

## 2022-06-04 LAB — CMP (CANCER CENTER ONLY)
ALT: 18 U/L (ref 0–44)
AST: 25 U/L (ref 15–41)
Albumin: 3.3 g/dL — ABNORMAL LOW (ref 3.5–5.0)
Alkaline Phosphatase: 62 U/L (ref 38–126)
Anion gap: 5 (ref 5–15)
BUN: 19 mg/dL (ref 8–23)
CO2: 31 mmol/L (ref 22–32)
Calcium: 9.5 mg/dL (ref 8.9–10.3)
Chloride: 103 mmol/L (ref 98–111)
Creatinine: 1.03 mg/dL (ref 0.61–1.24)
GFR, Estimated: >60 mL/min (ref >60)
Glucose, Bld: 102 mg/dL — ABNORMAL HIGH (ref 70–99)
Potassium: 4.2 mmol/L (ref 3.5–5.1)
Sodium: 139 mmol/L (ref 135–145)
Total Bilirubin: 0.5 mg/dL (ref 0.3–1.2)
Total Protein: 6.7 g/dL (ref 6.5–8.1)

## 2022-06-04 LAB — TSH: TSH: 1.349 u[IU]/mL (ref 0.350–4.500)

## 2022-06-04 MED ORDER — SODIUM CHLORIDE 0.9% FLUSH
10.0000 mL | Freq: Once | INTRAVENOUS | Status: AC
  Administered 2022-06-04: 10 mL

## 2022-06-04 MED ORDER — HEPARIN SOD (PORK) LOCK FLUSH 100 UNIT/ML IV SOLN
500.0000 [IU] | Freq: Once | INTRAVENOUS | Status: AC | PRN
  Administered 2022-06-04: 500 [IU]

## 2022-06-04 MED ORDER — SODIUM CHLORIDE 0.9% FLUSH
10.0000 mL | INTRAVENOUS | Status: DC | PRN
  Administered 2022-06-04: 10 mL

## 2022-06-04 MED ORDER — FAMOTIDINE IN NACL 20-0.9 MG/50ML-% IV SOLN
20.0000 mg | Freq: Once | INTRAVENOUS | Status: AC
  Administered 2022-06-04: 20 mg via INTRAVENOUS
  Filled 2022-06-04: qty 50

## 2022-06-04 MED ORDER — SODIUM CHLORIDE 0.9 % IV SOLN: Freq: Once | INTRAVENOUS | Status: AC

## 2022-06-04 MED ORDER — DIPHENHYDRAMINE HCL 50 MG/ML IJ SOLN
25.0000 mg | Freq: Once | INTRAMUSCULAR | Status: AC
  Administered 2022-06-04: 25 mg via INTRAVENOUS
  Filled 2022-06-04: qty 1

## 2022-06-04 MED ORDER — SODIUM CHLORIDE 0.9 % IV SOLN
200.0000 mg | Freq: Once | INTRAVENOUS | Status: AC
  Administered 2022-06-04: 200 mg via INTRAVENOUS
  Filled 2022-06-04: qty 200

## 2022-06-04 NOTE — Patient Instructions (Signed)
[facility]  Discharge Instructions: Thank you for choosing [facility] to provide your oncology and hematology care.   If you have a lab appointment with the [facility], please go directly to the [facility] and check in at the registration area.   Wear comfortable clothing and clothing appropriate for easy access to any Portacath or PICC line.   We strive to give you quality time with your provider. You may need to reschedule your appointment if you arrive late (15 or more minutes).  Arriving late affects you and other patients whose appointments are after yours.  Also, if you miss three or more appointments without notifying the office, you may be dismissed from the clinic at the provider's discretion.      For prescription refill requests, have your pharmacy contact our office and allow 72 hours for refills to be completed.    Today you received the following chemotherapy and/or immunotherapy agents: pembrolizumab      To help prevent nausea and vomiting after your treatment, we encourage you to take your nausea medication as directed.  BELOW ARE SYMPTOMS THAT SHOULD BE REPORTED IMMEDIATELY: *FEVER GREATER THAN 100.4 F (38 C) OR HIGHER *CHILLS OR SWEATING *NAUSEA AND VOMITING THAT IS NOT CONTROLLED WITH YOUR NAUSEA MEDICATION *UNUSUAL SHORTNESS OF BREATH *UNUSUAL BRUISING OR BLEEDING *URINARY PROBLEMS (pain or burning when urinating, or frequent urination) *BOWEL PROBLEMS (unusual diarrhea, constipation, pain near the anus) TENDERNESS IN MOUTH AND THROAT WITH OR WITHOUT PRESENCE OF ULCERS (sore throat, sores in mouth, or a toothache) UNUSUAL RASH, SWELLING OR PAIN  UNUSUAL VAGINAL DISCHARGE OR ITCHING   Items with * indicate a potential emergency and should be followed up as soon as possible or go to the Emergency Department if any problems should occur.  Please show the CHEMOTHERAPY ALERT CARD or IMMUNOTHERAPY ALERT CARD at check-in  to the Emergency Department and triage nurse.  Should you have questions after your visit or need to cancel or reschedule your appointment, please contact [facility]  Dept: [phone]  and follow the prompts.  Office hours are 8:00 a.m. to 4:30 p.m. Monday - Friday. Please note that voicemails left after 4:00 p.m. may not be returned until the following business day.  We are closed weekends and major holidays. You have access to a nurse at all times for urgent questions. Please call the main number to the clinic Dept: [phone] and follow the prompts.   For any non-urgent questions, you may also contact your provider using MyChart. We now offer e-Visits for anyone 32 and older to request care online for non-urgent symptoms. For details visit [email or web address].   Also download the MyChart app! Go to the app store, search "MyChart", open the app, select [facility], and log in with your MyChart username and password.  Masks are optional in the cancer centers. If you would like for your care team to wear a mask while they are taking care of you, please let them know. You may have one support person who is at least 75 years old accompany you for your appointments.

## 2022-06-05 ENCOUNTER — Other Ambulatory Visit: Payer: Self-pay | Admitting: Hematology

## 2022-06-05 DIAGNOSIS — A498 Other bacterial infections of unspecified site: Secondary | ICD-10-CM | POA: Diagnosis not present

## 2022-06-05 DIAGNOSIS — G510 Bell's palsy: Secondary | ICD-10-CM | POA: Diagnosis not present

## 2022-06-05 DIAGNOSIS — C61 Malignant neoplasm of prostate: Secondary | ICD-10-CM | POA: Diagnosis not present

## 2022-06-05 DIAGNOSIS — C07 Malignant neoplasm of parotid gland: Secondary | ICD-10-CM | POA: Diagnosis not present

## 2022-06-05 DIAGNOSIS — F17210 Nicotine dependence, cigarettes, uncomplicated: Secondary | ICD-10-CM | POA: Diagnosis not present

## 2022-06-05 DIAGNOSIS — C3492 Malignant neoplasm of unspecified part of left bronchus or lung: Secondary | ICD-10-CM

## 2022-06-05 DIAGNOSIS — C3490 Malignant neoplasm of unspecified part of unspecified bronchus or lung: Secondary | ICD-10-CM | POA: Diagnosis not present

## 2022-06-05 DIAGNOSIS — Z7189 Other specified counseling: Secondary | ICD-10-CM

## 2022-06-05 DIAGNOSIS — H60312 Diffuse otitis externa, left ear: Secondary | ICD-10-CM | POA: Diagnosis not present

## 2022-06-05 DIAGNOSIS — C7951 Secondary malignant neoplasm of bone: Secondary | ICD-10-CM

## 2022-06-05 DIAGNOSIS — B948 Sequelae of other specified infectious and parasitic diseases: Secondary | ICD-10-CM | POA: Diagnosis not present

## 2022-06-05 DIAGNOSIS — Z192 Hormone resistant malignancy status: Secondary | ICD-10-CM | POA: Diagnosis not present

## 2022-06-05 DIAGNOSIS — Z85818 Personal history of malignant neoplasm of other sites of lip, oral cavity, and pharynx: Secondary | ICD-10-CM | POA: Diagnosis not present

## 2022-06-05 NOTE — Progress Notes (Signed)
Orders set updated in [facility].

## 2022-06-10 DIAGNOSIS — H60312 Diffuse otitis externa, left ear: Secondary | ICD-10-CM | POA: Diagnosis not present

## 2022-06-11 DIAGNOSIS — H02206 Unspecified lagophthalmos left eye, unspecified eyelid: Secondary | ICD-10-CM | POA: Diagnosis not present

## 2022-06-11 DIAGNOSIS — I2510 Atherosclerotic heart disease of native coronary artery without angina pectoris: Secondary | ICD-10-CM | POA: Diagnosis not present

## 2022-06-11 DIAGNOSIS — C689 Malignant neoplasm of urinary organ, unspecified: Secondary | ICD-10-CM | POA: Diagnosis not present

## 2022-06-11 DIAGNOSIS — I214 Non-ST elevation (NSTEMI) myocardial infarction: Secondary | ICD-10-CM | POA: Diagnosis not present

## 2022-06-11 DIAGNOSIS — E785 Hyperlipidemia, unspecified: Secondary | ICD-10-CM | POA: Diagnosis not present

## 2022-06-11 DIAGNOSIS — Z7901 Long term (current) use of anticoagulants: Secondary | ICD-10-CM | POA: Diagnosis not present

## 2022-06-11 DIAGNOSIS — Z556 Problems related to health literacy: Secondary | ICD-10-CM | POA: Diagnosis not present

## 2022-06-11 DIAGNOSIS — Z79891 Long term (current) use of opiate analgesic: Secondary | ICD-10-CM | POA: Diagnosis not present

## 2022-06-11 DIAGNOSIS — Z7902 Long term (current) use of antithrombotics/antiplatelets: Secondary | ICD-10-CM | POA: Diagnosis not present

## 2022-06-11 DIAGNOSIS — F17210 Nicotine dependence, cigarettes, uncomplicated: Secondary | ICD-10-CM | POA: Diagnosis not present

## 2022-06-11 DIAGNOSIS — H60312 Diffuse otitis externa, left ear: Secondary | ICD-10-CM | POA: Diagnosis not present

## 2022-06-11 DIAGNOSIS — G510 Bell's palsy: Secondary | ICD-10-CM | POA: Diagnosis not present

## 2022-06-11 DIAGNOSIS — M8788 Other osteonecrosis, other site: Secondary | ICD-10-CM | POA: Diagnosis not present

## 2022-06-11 DIAGNOSIS — C07 Malignant neoplasm of parotid gland: Secondary | ICD-10-CM | POA: Diagnosis not present

## 2022-06-11 DIAGNOSIS — C3490 Malignant neoplasm of unspecified part of unspecified bronchus or lung: Secondary | ICD-10-CM | POA: Diagnosis not present

## 2022-06-16 ENCOUNTER — Telehealth: Payer: Self-pay | Admitting: Registered Nurse

## 2022-06-16 DIAGNOSIS — M8788 Other osteonecrosis, other site: Secondary | ICD-10-CM | POA: Diagnosis not present

## 2022-06-16 DIAGNOSIS — I214 Non-ST elevation (NSTEMI) myocardial infarction: Secondary | ICD-10-CM | POA: Diagnosis not present

## 2022-06-16 DIAGNOSIS — C3490 Malignant neoplasm of unspecified part of unspecified bronchus or lung: Secondary | ICD-10-CM | POA: Diagnosis not present

## 2022-06-16 DIAGNOSIS — C07 Malignant neoplasm of parotid gland: Secondary | ICD-10-CM | POA: Diagnosis not present

## 2022-06-16 DIAGNOSIS — C689 Malignant neoplasm of urinary organ, unspecified: Secondary | ICD-10-CM | POA: Diagnosis not present

## 2022-06-16 DIAGNOSIS — H60312 Diffuse otitis externa, left ear: Secondary | ICD-10-CM | POA: Diagnosis not present

## 2022-06-16 NOTE — Telephone Encounter (Signed)
Spouse state that Dr Orland Mustard was leaving the practice and since schedules with [facility]

## 2022-06-18 ENCOUNTER — Other Ambulatory Visit: Payer: Self-pay

## 2022-06-20 DIAGNOSIS — R197 Diarrhea, unspecified: Secondary | ICD-10-CM | POA: Diagnosis not present

## 2022-06-22 ENCOUNTER — Other Ambulatory Visit: Payer: Self-pay

## 2022-06-23 ENCOUNTER — Other Ambulatory Visit: Payer: Self-pay

## 2022-06-23 DIAGNOSIS — Z923 Personal history of irradiation: Secondary | ICD-10-CM | POA: Diagnosis not present

## 2022-06-23 DIAGNOSIS — C3492 Malignant neoplasm of unspecified part of left bronchus or lung: Secondary | ICD-10-CM

## 2022-06-23 DIAGNOSIS — Z792 Long term (current) use of antibiotics: Secondary | ICD-10-CM | POA: Diagnosis not present

## 2022-06-23 DIAGNOSIS — C07 Malignant neoplasm of parotid gland: Secondary | ICD-10-CM | POA: Diagnosis not present

## 2022-06-23 DIAGNOSIS — M879 Osteonecrosis, unspecified: Secondary | ICD-10-CM | POA: Diagnosis not present

## 2022-06-24 ENCOUNTER — Encounter (HOSPITAL_BASED_OUTPATIENT_CLINIC_OR_DEPARTMENT_OTHER): Payer: Self-pay | Admitting: Nurse Practitioner

## 2022-06-24 ENCOUNTER — Ambulatory Visit (INDEPENDENT_AMBULATORY_CARE_PROVIDER_SITE_OTHER): Payer: Medicare Other | Admitting: Nurse Practitioner

## 2022-06-24 VITALS — BP 96/59 | HR 77 | Ht 70.0 in | Wt 126.0 lb

## 2022-06-24 DIAGNOSIS — Z0000 Encounter for general adult medical examination without abnormal findings: Secondary | ICD-10-CM | POA: Diagnosis not present

## 2022-06-24 DIAGNOSIS — Z23 Encounter for immunization: Secondary | ICD-10-CM

## 2022-06-24 DIAGNOSIS — G893 Neoplasm related pain (acute) (chronic): Secondary | ICD-10-CM | POA: Diagnosis not present

## 2022-06-24 DIAGNOSIS — E43 Unspecified severe protein-calorie malnutrition: Secondary | ICD-10-CM | POA: Diagnosis not present

## 2022-06-24 DIAGNOSIS — I214 Non-ST elevation (NSTEMI) myocardial infarction: Secondary | ICD-10-CM | POA: Diagnosis not present

## 2022-06-24 DIAGNOSIS — C61 Malignant neoplasm of prostate: Secondary | ICD-10-CM

## 2022-06-24 DIAGNOSIS — J439 Emphysema, unspecified: Secondary | ICD-10-CM

## 2022-06-24 DIAGNOSIS — C7951 Secondary malignant neoplasm of bone: Secondary | ICD-10-CM | POA: Diagnosis not present

## 2022-06-24 DIAGNOSIS — I10 Essential (primary) hypertension: Secondary | ICD-10-CM | POA: Diagnosis not present

## 2022-06-24 DIAGNOSIS — C3492 Malignant neoplasm of unspecified part of left bronchus or lung: Secondary | ICD-10-CM | POA: Diagnosis not present

## 2022-06-24 DIAGNOSIS — M8788 Other osteonecrosis, other site: Secondary | ICD-10-CM | POA: Diagnosis not present

## 2022-06-24 DIAGNOSIS — H60312 Diffuse otitis externa, left ear: Secondary | ICD-10-CM | POA: Diagnosis not present

## 2022-06-24 DIAGNOSIS — J4489 Other specified chronic obstructive pulmonary disease: Secondary | ICD-10-CM | POA: Diagnosis not present

## 2022-06-24 DIAGNOSIS — C689 Malignant neoplasm of urinary organ, unspecified: Secondary | ICD-10-CM

## 2022-06-24 DIAGNOSIS — C07 Malignant neoplasm of parotid gland: Secondary | ICD-10-CM

## 2022-06-24 DIAGNOSIS — C3490 Malignant neoplasm of unspecified part of unspecified bronchus or lung: Secondary | ICD-10-CM | POA: Diagnosis not present

## 2022-06-24 MED ORDER — TRELEGY ELLIPTA 100-62.5-25 MCG/ACT IN AEPB: INHALATION_SPRAY | RESPIRATORY_TRACT | 3 refills | Status: DC

## 2022-06-24 NOTE — Patient Instructions (Signed)
Thank you for choosing [facility] at [facility] for your Primary Care needs. I am excited for the opportunity to partner with you to meet your health care goals. It was a pleasure meeting you today!  Recommendations from today's visit: It was a pleasure to meet you today.  If you have any concerns, please do not hesitate to contact me.   Information on diet, exercise, and health maintenance recommendations are listed below. This is information to help you be sure you are on track for optimal health and monitoring.   Please look over this and let us know if you have any questions or if you have completed any of the health maintenance outside of [facility] so that we can be sure your records are up to date.  ___________________________________________________________ About Me: I am an Adult-Geriatric Nurse Practitioner with a background in caring for patients for more than 20 years with a strong intensive care background. I provide primary care and sports medicine services to patients age 75 and older within this office. My education had a strong focus on caring for the older adult population, which I am passionate about. I am also the director of the APP Fellowship with [facility].   My desire is to provide you with the best service through preventive medicine and supportive care. I consider you a part of the medical team and value your input. I work diligently to ensure that you are heard and your needs are met in a safe and effective manner. I want you to feel comfortable with me as your provider and want you to know that your health concerns are important to me.  For your information, our office hours are: Monday, Tuesday, and Thursday 8:00 AM - 5:00 PM Wednesday and Friday 8:00 AM - 12:00 PM.   In my time away from the office I am teaching new APP's within the system and am unavailable, but my partner, Dr. Burnard Bunting is in the office for emergent needs.   If you have  questions or concerns, please call our office at [phone] or send [place] a MyChart message and we will respond as quickly as possible.  ____________________________________________________________ MyChart:  For all urgent or time sensitive needs we ask that you please call the office to avoid delays. Our number is [phone]. MyChart is not constantly monitored and due to the large volume of messages a day, replies may take up to 72 business hours.  MyChart Policy: MyChart allows for you to see your visit notes, after visit summary, provider recommendations, lab and tests results, make an appointment, request refills, and contact your provider or the office for non-urgent questions or concerns. Providers are seeing patients during normal business hours and do not have built in time to review MyChart messages.  We ask that you allow a minimum of 3 business days for responses to [employer]. For this reason, please do not send urgent requests through [facility]. Please call the office at [phone]. New and ongoing conditions may require a visit. We have virtual and in person visit available for your convenience.  Complex MyChart concerns may require a visit. Your provider may request you schedule a virtual or in person visit to ensure we are providing the best care possible. MyChart messages sent after 11:00 AM on Friday will not be received by the provider until Monday morning.    Lab and Test Results: You will receive your lab and test results on MyChart as soon as they are  completed and results have been sent by the lab or testing facility. Due to this service, you will receive your results BEFORE your provider.  I review lab and tests results each morning prior to seeing patients. Some results require collaboration with other providers to ensure you are receiving the most appropriate care. For this reason, we ask that you please allow a minimum of 3-5 business days from the time the ALL  results have been received for your provider to receive and review lab and test results and contact you about these.  Most lab and test result comments from the provider will be sent through [facility]. Your provider may recommend changes to the plan of care, follow-up visits, repeat testing, ask questions, or request an office visit to discuss these results. You may reply directly to this message or call the office at [phone] to provide information for the provider or set up an appointment. In some instances, you will be called with test results and recommendations. Please let us know if this is preferred and we will make note of this in your chart to provide this for you.    If you have not heard a response to your lab or test results in 5 business days from all results returning to [facility], please call the office to let us know. We ask that you please avoid calling prior to this time unless there is an emergent concern. Due to high call volumes, this can delay the resulting process.  After Hours: For all non-emergency after hours needs, please call the office at [phone] and select the option to reach the on-call provider service. On-call services are shared between multiple [facility] offices and therefore it will not be possible to speak directly with your provider. On-call providers may provide medical advice and recommendations, but are unable to provide refills for maintenance medications.  For all emergency or urgent medical needs after normal business hours, we recommend that you seek care at the closest Urgent Care or Emergency Department to ensure appropriate treatment in a timely manner.  [facility] has a 24 hour emergency room located on the ground floor for your convenience.   Urgent Concerns During the Business Day Providers are seeing patients from 8AM to [place] with a busy schedule and are most often not able to respond to non-urgent calls until the end of the  day or the next business day. If you should have URGENT concerns during the day, please call and speak to the nurse or schedule a same day appointment so that we can address your concern without delay.   Thank you, again, for choosing me as your health care partner. I appreciate your trust and look forward to learning more about you.   Kyle Keeler, DNP, AGNP-c ___________________________________________________________  Health Maintenance Recommendations Screening Testing Mammogram Every 1 -2 years based on history and risk factors Starting at age 48 Pap Smear Ages 21-39 every 3 years Ages 94-65 every 5 years with HPV testing More frequent testing may be required based on results and history Colon Cancer Screening Every 1-10 years based on test performed, risk factors, and history Starting at age 39 Bone Density Screening Every 2-10 years based on history Starting at age 17 for women Recommendations for men differ based on medication usage, history, and risk factors AAA Screening One time ultrasound Men 23-76 years old who have every smoked Lung Cancer Screening Low Dose Lung CT every 12 months Age 77-80 years with a 30 pack-year  smoking history who still smoke or who have quit within the last 15 years  Screening Labs Routine  Labs: Complete Blood Count (CBC), Complete Metabolic Panel (CMP), Cholesterol (Lipid Panel) Every 6-12 months based on history and medications May be recommended more frequently based on current conditions or previous results Hemoglobin A1c Lab Every 3-12 months based on history and previous results Starting at age 83 or earlier with diagnosis of diabetes, high cholesterol, BMI >26, and/or risk factors Frequent monitoring for patients with diabetes to ensure blood sugar control Thyroid Panel (TSH w/ T3 & T4) Every 6 months based on history, symptoms, and risk factors May be repeated more often if on medication HIV One time testing for all patients  25 and older May be repeated more frequently for patients with increased risk factors or exposure Hepatitis C One time testing for all patients 64 and older May be repeated more frequently for patients with increased risk factors or exposure Gonorrhea, Chlamydia Every 12 months for all sexually active persons 13-24 years Additional monitoring may be recommended for those who are considered high risk or who have symptoms PSA Men 79-1 years old with risk factors Additional screening may be recommended from age 62-69 based on risk factors, symptoms, and history  Vaccine Recommendations Tetanus Booster All adults every 10 years Flu Vaccine All patients 6 months and older every year COVID Vaccine All patients 12 years and older Initial dosing with booster May recommend additional booster based on age and health history HPV Vaccine 2 doses all patients age 24-26 Dosing may be considered for patients over 26 Shingles Vaccine (Shingrix) 2 doses all adults 73 years and older Pneumonia (Pneumovax 23) All adults 47 years and older May recommend earlier dosing based on health history Pneumonia (Prevnar 34) All adults 9 years and older Dosed 1 year after Pneumovax 23  Additional Screening, Testing, and Vaccinations may be recommended on an individualized basis based on family history, health history, risk factors, and/or exposure.  __________________________________________________________  Diet Recommendations for All Patients  I recommend that all patients maintain a diet low in saturated fats, carbohydrates, and cholesterol. While this can be challenging at first, it is not impossible and small changes can make big differences.  Things to try: Decreasing the amount of soda, sweet tea, and/or juice to one or less per day and replace with water While water is always the first choice, if you do not like water you may consider adding a water additive without sugar to improve the  taste other sugar free drinks Replace potatoes with a brightly colored vegetable at dinner Use healthy oils, such as canola oil or olive oil, instead of butter or hard margarine Limit your bread intake to two pieces or less a day Replace regular pasta with low carb pasta options Bake, broil, or grill foods instead of frying Monitor portion sizes  Eat smaller, more frequent meals throughout the day instead of large meals  An important thing to remember is, if you love foods that are not great for your health, you don't have to give them up completely. Instead, allow these foods to be a reward when you have done well. Allowing yourself to still have special treats every once in a while is a nice way to tell yourself thank you for working hard to keep yourself healthy.   Also remember that every day is a new day. If you have a bad day and "fall off the wagon", you can still climb right back up and  keep moving along on your journey!  We have resources available to help you!  Some websites that may be helpful include: www.http://carter.biz/  Www.VeryWellFit.com _____________________________________________________________  Activity Recommendations for All Patients  I recommend that all adults get at least 20 minutes of moderate physical activity that elevates your heart rate at least 5 days out of the week.  Some examples include: Walking or jogging at a pace that allows you to carry on a conversation Cycling (stationary bike or outdoors) Water aerobics Yoga Weight lifting Dancing If physical limitations prevent you from putting stress on your joints, exercise in a pool or seated in a chair are excellent options.  Do determine your MAXIMUM heart rate for activity: YOUR AGE - 220 = MAX HeartRate   Remember! Do not push yourself too hard.  Start slowly and build up your pace, speed, weight, time in exercise, etc.  Allow your body to rest between exercise and get good sleep. You will need more  water than normal when you are exerting yourself. Do not wait until you are thirsty to drink. Drink with a purpose of getting in at least 8, 8 ounce glasses of water a day plus more depending on how much you exercise and sweat.    If you begin to develop dizziness, chest pain, abdominal pain, jaw pain, shortness of breath, headache, vision changes, lightheadedness, or other concerning symptoms, stop the activity and allow your body to rest. If your symptoms are severe, seek emergency evaluation immediately. If your symptoms are concerning, but not severe, please let us know so that we can recommend further evaluation.

## 2022-06-24 NOTE — Progress Notes (Signed)
Orma Render, DNP, AGNP-c [facility] [address]  [place], [place] [address] [phone]  New patient visit   Patient: Kyle Foster   DOB: Jan 09, 1946   75 y.o. Male  MRN: [id number] Visit Date: 06/24/2022  Patient Care Team: Selena Swaminathan, Coralee Pesa, NP as PCP - General (Nurse Practitioner) Lorretta Harp, MD as PCP - Cardiology (Cardiology) Brunetta Genera, MD as Consulting Physician (Hematology) Margaretha Seeds, MD as Consulting Physician (Pulmonary Disease) [name], MD as Consulting Physician (Urology) Katheren Puller, RN as Oncology Nurse Navigator  Today's Vitals   06/24/22 1508  BP: (!) 96/59  Pulse: 77  SpO2: 99%  Weight: 126 lb (57.2 kg)  Height: 5\' 10"  (1.778 m)   Body mass index is 18.08 kg/m.   Today's healthcare provider: Orma Render, NP   Chief Complaint  Patient presents with   New Patient (Initial Visit)    Patient presents today to establish care referral Dr Orland Mustard. NO concerns. He would like flu shot   Subjective    Kyle Foster is a 76 y.o. male who presents today as a new patient to establish care.    Patient endorses the following concerns presently: Ear infection since last December - he was treated with ENT and was referred to another ENT and they were unable to clear and then referred to infectious disease doctor and he  He is currently on ceftriaxone daily IV and metronidazole 500mg  TID. He started this on the 2nd. He had to stop recently because of diarrhea. They have called him in a medication that he will start once the pharmacy gets this in stock and he will restart the antibiotics once this is finished.  He has seen the ENT recently and they told him that it is look better  Currently has lung cancer. Has 8 10 rounds of radiation, 8 rounds of chemo. Cancer is attached the the left lung and the 5th and 6th vertebrae. They were able to shrink it about by 1/3 and that has helped  with the pain. He is wearing a fentanyl patch, morphine every 4-6 hours, and immunotherapy.  He recently found out that he has prostate cancer. He is awaiting radiactive implants  In 2009 he had a heart attack and while in the hospital discovered bladder cancer.  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 He had to have his ear removed and soaked in radioactive medication and then put back on.   Shortly after he was hospitalized for radiation overdose- his liver was shutting down at that time.   In jan/Feb 2020 he was diagnosed with lung cancer, another surgery for bladder cancer, and now prostate.   Parotid gland, lung cancer, and prostate are all the same type of cancer. This is a type of cancer caused by agent orange.   The two bladder cancers have been normal bladder cancer in different spots.   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    GERD (gastroesophageal reflux disease)  History of cancer chemotherapy    History of external beam radiation therapy    completed radiation for parotid cancer 2011;   and had radiation for lung cancer completed 02/ 2020   History of kidney stones    History of MI (myocardial infarction) 06/2008   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 immunotherapy;   mets to bone,  stage IV,  completed radiation 11-03-2018,  chemotherapy ongoing since 11-24-2018   Past Surgical History:  Procedure Laterality Date   CATARACT EXTRACTION W/ INTRAOCULAR LENS IMPLANT Right 12/2007   CORONARY ANGIOPLASTY WITH STENT PLACEMENT  07/03/2008   @[facility]  by dr berry;   BMS x3 to AV groove LCFx and marginal branch biforcation ,  normal LVF,  RCA 70%   CYSTOSCOPY W/ RETROGRADES  09/22/2011   Procedure: CYSTOSCOPY WITH RETROGRADE PYELOGRAM;  Surgeon: Bernestine Amass, MD;  Location: [facility];  Service: Urology;  Laterality: Left;  Cystoscopy left Retrograde Pyelogram      (c-arm)    CYSTOSCOPY WITH BIOPSY  09/22/2011   Procedure: CYSTOSCOPY WITH BIOPSY;  Surgeon: Bernestine Amass, MD;  Location: [facility];  Service: Urology;  Laterality: N/A;   Biopsy   CYSTOSCOPY WITH BIOPSY N/A 04/19/2021   Procedure: CYSTOSCOPY WITH BLADDER  BIOPSY WITH FULGERATION;  Surgeon: [name], MD;  Location: [facility];  Service: Urology;  Laterality: N/A;   EXCISIONAL HEMORRHOIDECTOMY  03/15/2002   @[facility]    FOOT NEUROMA SURGERY Left 2005   INGUINAL HERNIA REPAIR Right 02/2018   IR IMAGING GUIDED PORT INSERTION  11/23/2018   PAROTIDECTOMY W/ NECK DISSECTION TOTAL  12/18/2009   @[facility]  by dr Vicie Mutters;   TOTAL LEFT PAROTIDECTOMY/   LEFT MASTOIDECTOMY/    LEFT SELECTIVE NECK DISSECTIONS/     STERNOCLEIDOMASTOID FLAP RECONSTRUCTION/  GOLD WEIGT IMPANT LEFT UPPER EYELID   SALIVARY GLAND SURGERY Left 11/02/2009   @[facility]  by dr Lucia Gaskins;   LEFT SUPERFICIAL PAROTECTOMY W/ FACIAL NERVE DISSECTION AND EXCISION LEFT PAROTID MASS   SURGERY OF LIP  06/16/2011   @[facility] ;   LEFT UPPER LIP LABIOPLASTY   SURGERY OF LIP  01/11/2016   @[facility] ;   LEFT UPPER LIP STATIC FACIAL SUSPENSION   TRANSURETHRAL RESECTION OF BLADDER TUMOR WITH GYRUS (TURBT-GYRUS)  10/16/2008   @[facility]    Family Status  Relation Name Status   Mother  Deceased   Father  Deceased    Brother  Deceased at age 56   Sister  38   Daughter  Alive   Son  Alive   Son  Alive   Family History  Problem Relation Age of Onset   Heart attack Mother 39   Cancer Mother        Lung   Diabetes Mother    Heart disease Mother    Hypertension Mother    Stroke Father 23   Cancer Father        Lung   Brain cancer Brother    Cancer Sister        Melanoma of great Toe   Hyperlipidemia  Sister    Social History   Socioeconomic History   Marital status: Married    Spouse name: Not on file   Number of children: Not on file   Years of education: Not on file   Highest education level: Not on file  Occupational History   Not on file  Tobacco Use   Smoking status: Every Day    Packs/day: 0.50    Years: 52.00    Total pack years: 26.00    Types: Cigarettes    Start date: 1968   Smokeless tobacco: Never   Tobacco comments:    03/21/19- smoking 10 cigs a day   Vaping Use   Vaping Use: Never used  Substance and Sexual Activity   Alcohol use: Not Currently   Drug use: Never   Sexual activity: Not on file  Other Topics Concern   Not on file  Social History Narrative   Not on file   Social Determinants of Health   Financial Resource Strain: Not on file  Food Insecurity: Not on file  Transportation Needs: Not on file  Physical Activity: Not on file  Stress: Not on file  Social Connections: Not on file   Outpatient Medications Prior to Visit  Medication Sig   acetaminophen (TYLENOL) 500 MG tablet Take 1,000 mg by mouth every 8 (eight) hours as needed for moderate pain.   atorvastatin (LIPITOR) 80 MG tablet Take 1 tablet (80 mg total) by mouth daily. (Patient taking differently: Take 80 mg by mouth at bedtime.)   B Complex-C (B-COMPLEX WITH VITAMIN C) tablet Take 1 tablet by mouth daily.   calcium carbonate (TUMS - DOSED IN MG ELEMENTAL CALCIUM) 500 MG chewable tablet Chew 1 tablet (200 mg of elemental calcium total) by mouth 3 (three) times daily with meals.  (Patient taking differently: Chew 1 tablet by mouth 3 (three) times daily as needed for indigestion.)   Cholecalciferol (VITAMIN D) 2000 UNITS tablet Take 2,000 Units by mouth daily.   clopidogrel (PLAVIX) 75 MG tablet Take 1 tablet (75 mg total) by mouth daily. NEEDS APPOINTMENT FOR FUTURE REFILLS   DULoxetine (CYMBALTA) 30 MG capsule Take 1 capsule by mouth daily.   Hypromellose (ARTIFICIAL TEARS OP) Place 1 drop into both eyes as needed.   ipratropium-albuterol (DUONEB) 0.5-2.5 (3) MG/3ML SOLN Take 3 mLs by nebulization 4 (four) times daily as needed.   Lactobacillus (PROBIOTIC GOLD EXTRA STRENGTH) CAPS Take 1 capsule by mouth daily.   lidocaine-prilocaine (EMLA) cream Apply 1 application topically as needed (access port).   loratadine (CLARITIN) 10 MG tablet Take 10 mg by mouth daily as needed for allergies.   magnesium oxide (MAG-OX) 400 MG tablet Take 1 tablet (400 mg total) by mouth daily.   pantoprazole (PROTONIX) 40 MG tablet Take 1 tablet by mouth daily.   Polyvinyl Alcohol (TEARS AGAIN OP) Place 1 drop into the left eye nightly.   [DISCONTINUED] atorvastatin (LIPITOR) 80 MG tablet Take 1 tablet by mouth daily.   [DISCONTINUED] Cholecalciferol 50 MCG (2000 UT) TABS Take 1 tablet by mouth daily.   [DISCONTINUED] DULoxetine (CYMBALTA) 30 MG capsule TAKE 1 CAPSULE BY MOUTH EVERY DAY   [DISCONTINUED] fentaNYL (DURAGESIC) 50 MCG/HR Place 1 patch onto the skin every 3 (three) days.   [DISCONTINUED] loratadine (CLARITIN) 10 MG tablet Take 1 tablet by mouth daily.   [DISCONTINUED] metoprolol tartrate (LOPRESSOR) 25 MG tablet TAKE 1 TABLET BY MOUTH TWICE A DAY   [DISCONTINUED] morphine (MSIR) 15 MG tablet Take 1-2 tablets (15-30 mg  total) by mouth every 4 (four) hours as needed for moderate pain or severe pain.   [DISCONTINUED] ondansetron (ZOFRAN) 8 MG tablet Take 1 tablet (8 mg total) by mouth every 8 (eight) hours as needed for nausea or vomiting.   [DISCONTINUED] ondansetron (ZOFRAN) 8 MG  tablet Take 1 tablet by mouth 3 (three) times daily as needed.   [DISCONTINUED] pantoprazole (PROTONIX) 40 MG tablet TAKE 1 TABLET BY MOUTH EVERY DAY   [DISCONTINUED] TRELEGY ELLIPTA 100-62.5-25 MCG/ACT AEPB INHALE 1 PUFF BY MOUTH EVERY DAY   chlorhexidine (PERIDEX) 0.12 % solution SMARTSIG:By Mouth   metroNIDAZOLE (FLAGYL) 500 MG tablet Take 500 mg by mouth 3 (three) times daily.   [DISCONTINUED] cefTRIAXone (ROCEPHIN) 10 g injection    [DISCONTINUED] diphenhydrAMINE (BENADRYL) 50 MG/ML injection    No facility-administered medications prior to visit.   Allergies  Allergen Reactions   Codeine Other (See Comments)    HEADACHE   Immunization History  Administered Date(s) Administered   Fluad Quad(high Dose 65+) 05/02/2019, 05/24/2020, 06/11/2021   Influenza, High Dose Seasonal PF 08/14/2016, 10/06/2017, 05/24/2018, 06/08/2018   Influenza, Quadrivalent, Recombinant, Inj, Pf 06/24/2022   Influenza-Unspecified 07/11/2013, 06/22/2014, 08/14/2015   Moderna Covid-19 Vaccine Bivalent Booster 16yrs & up 07/04/2021   Moderna Sars-Covid-2 Vaccination 10/14/2019, 11/21/2019   Pneumococcal Conjugate-13 06/22/2014, 03/01/2020   Pneumococcal Polysaccharide-23 05/03/2013, 05/24/2020   Tdap 05/03/2013   Zoster Recombinat (Shingrix) 10/06/2017, 01/19/2018    Review of Systems All review of systems negative except what is listed in the HPI   Objective    BP (!) 96/59   Pulse 77   Ht 5\' 10"  (1.778 m)   Wt 126 lb (57.2 kg)   SpO2 99%   BMI 18.08 kg/m  Physical Exam Vitals and nursing note reviewed.  Constitutional:      General: He is not in acute distress.    Appearance: Normal appearance. He is not ill-appearing.  HENT:     Head: Normocephalic.  Eyes:     Extraocular Movements: Extraocular movements intact.     Conjunctiva/sclera: Conjunctivae normal.     Pupils: Pupils are equal, round, and reactive to light.  Neck:     Vascular: No carotid bruit.  Cardiovascular:     Rate and  Rhythm: Normal rate and regular rhythm.     Pulses: Normal pulses.     Heart sounds: Normal heart sounds. No murmur heard. Pulmonary:     Effort: Pulmonary effort is normal.     Breath sounds: Normal breath sounds. No wheezing.  Abdominal:     General: Abdomen is flat. Bowel sounds are normal. There is no distension.     Palpations: Abdomen is soft.     Tenderness: There is no abdominal tenderness. There is no guarding.  Musculoskeletal:        General: Normal range of motion.     Cervical back: Normal range of motion.     Right lower leg: No edema.     Left lower leg: No edema.  Lymphadenopathy:     Cervical: No cervical adenopathy.  Skin:    General: Skin is warm and dry.     Capillary Refill: Capillary refill takes less than 2 seconds.  Neurological:     General: No focal deficit present.     Mental Status: He is alert and oriented to person, place, and time.     Motor: No weakness.  Psychiatric:        Mood and Affect: Mood normal.  Behavior: Behavior normal.        Thought Content: Thought content normal.        Judgment: Judgment normal.     No results found for any visits on 06/24/22.  Assessment & Plan      Problem List Items Addressed This Visit     Encounter for medical examination to establish care - Primary    Review of current and past medical history, social history, medication, and family history.  Review of care gaps and health maintenance recommendations.  Records from recent providers to be requested if not available in Chart Review or Care Everywhere.  Recommendations for health maintenance, diet, and exercise provided.  In depth discussion of medical history and current treatments. Will plan to continue to review record and evaluate for needs. F/U in 3 months for chronic care management.       Essential hypertension   Squamous cell lung cancer, left (HCC)   Relevant Medications   metroNIDAZOLE (FLAGYL) 500 MG tablet   COPD with chronic  bronchitis and emphysema (HCC)   Relevant Medications   Fluticasone-Umeclidin-Vilant (TRELEGY ELLIPTA) 100-62.5-25 MCG/ACT AEPB   Severe protein-calorie malnutrition (HCC)   Metastatic cancer (HCC)   Relevant Medications   metroNIDAZOLE (FLAGYL) 500 MG tablet   Transitional cell carcinoma (HCC)   Relevant Medications   metroNIDAZOLE (FLAGYL) 500 MG tablet   Cancer associated pain   Other Visit Diagnoses     Flu vaccine need       Relevant Orders   Flu vaccine, recombinat, quadrivalent, inj (Completed)        Return in about 3 months (around 09/24/2022) for General Follow-up and labs.      Ulice Follett, Coralee Pesa, NP, DNP, AGNP-C [facility] [place] Maintenance Due Health Maintenance Topics with due status: Overdue     Topic Date Due   COVID-19 Vaccine 08/29/2021    CPE Due  Labs Due

## 2022-06-25 ENCOUNTER — Encounter: Payer: Self-pay | Admitting: Hematology

## 2022-06-25 ENCOUNTER — Inpatient Hospital Stay: Payer: No Typology Code available for payment source | Admitting: Hematology

## 2022-06-25 ENCOUNTER — Inpatient Hospital Stay: Payer: No Typology Code available for payment source

## 2022-06-25 ENCOUNTER — Other Ambulatory Visit: Payer: Self-pay

## 2022-06-25 MED ORDER — MORPHINE SULFATE 15 MG PO TABS: 15.0000 mg | ORAL_TABLET | ORAL | 0 refills | Status: DC | PRN

## 2022-06-25 MED ORDER — FENTANYL 50 MCG/HR TD PT72: 1.0000 | MEDICATED_PATCH | TRANSDERMAL | 0 refills | Status: DC

## 2022-06-25 MED ORDER — ONDANSETRON HCL 8 MG PO TABS: 8.0000 mg | ORAL_TABLET | Freq: Three times a day (TID) | ORAL | 2 refills | Status: DC | PRN

## 2022-06-30 ENCOUNTER — Ambulatory Visit: Payer: Medicare Other | Attending: Cardiology | Admitting: General Practice

## 2022-06-30 DIAGNOSIS — C3490 Malignant neoplasm of unspecified part of unspecified bronchus or lung: Secondary | ICD-10-CM | POA: Diagnosis not present

## 2022-06-30 DIAGNOSIS — I214 Non-ST elevation (NSTEMI) myocardial infarction: Secondary | ICD-10-CM | POA: Diagnosis not present

## 2022-06-30 DIAGNOSIS — Z01810 Encounter for preprocedural cardiovascular examination: Secondary | ICD-10-CM | POA: Diagnosis not present

## 2022-06-30 DIAGNOSIS — I10 Essential (primary) hypertension: Secondary | ICD-10-CM

## 2022-06-30 DIAGNOSIS — C689 Malignant neoplasm of urinary organ, unspecified: Secondary | ICD-10-CM | POA: Diagnosis not present

## 2022-06-30 DIAGNOSIS — H60312 Diffuse otitis externa, left ear: Secondary | ICD-10-CM | POA: Diagnosis not present

## 2022-06-30 DIAGNOSIS — C07 Malignant neoplasm of parotid gland: Secondary | ICD-10-CM | POA: Diagnosis not present

## 2022-06-30 DIAGNOSIS — M8788 Other osteonecrosis, other site: Secondary | ICD-10-CM | POA: Diagnosis not present

## 2022-06-30 NOTE — Progress Notes (Signed)
ATTN: SEE CLEARANCE NOTES  

## 2022-06-30 NOTE — Progress Notes (Signed)
Virtual Visit via Telephone Note   Because of Kyle Foster's co-morbid illnesses, he is at least at moderate risk for complications without adequate follow up.  This format is felt to be most appropriate for this patient at this time.  The patient did not have access to video technology/had technical difficulties with video requiring transitioning to audio format only (telephone).  All issues noted in this document were discussed and addressed.  No physical exam could be performed with this format.  Please refer to the patient's chart for his consent to telehealth for [facility].  Evaluation Performed:  Preoperative cardiovascular risk assessment _____________   Date:  06/30/2022   Patient ID:  Kyle Foster, DOB July 21, 1946, MRN [id number] Patient Location:  Home Provider location:   Office  Primary Care Provider:  Orma Render, NP Primary Cardiologist:  Quay Burow, MD  Chief Complaint / Patient Profile   76 y.o. y/o male with a h/o coronary artery disease, HTN, RBBB, COPD, GERD, HLD who is pending fiducial markers and space oar gel and presents today for telephonic preoperative cardiovascular risk assessment.  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 immunotherapy   Past Surgical History:  Procedure Laterality Date   CATARACT EXTRACTION W/  INTRAOCULAR LENS IMPLANT Right 12/2007   CORONARY ANGIOPLASTY WITH STENT PLACEMENT  06/2008   "3 stents" (06/14/2018)   CYSTOSCOPY W/ RETROGRADES  09/22/2011   Procedure: CYSTOSCOPY WITH RETROGRADE PYELOGRAM;  Surgeon: Bernestine Amass, MD;  Location[facility];  Service: Urology;  Laterality: Left;  Cystoscopy left Retrograde Pyelogram      (c-arm)    CYSTOSCOPY WITH BIOPSY  09/22/2011   Procedure: CYSTOSCOPY WITH BIOPSY;  Surgeon: Bernestine Amass, MD;  Location: [facility];  Service: Urology;  Laterality: N/A;   Biopsy   CYSTOSCOPY WITH BIOPSY N/A 04/19/2021   Procedure: CYSTOSCOPY WITH BLADDER  BIOPSY WITH FULGERATION;  Surgeon: [name], MD;  Location: [facility];  Service: Urology;  Laterality: N/A;   EXCISIONAL HEMORRHOIDECTOMY  ~ 2006   EYE SURGERY Left 04/2011   "reconstruction; gold weight in eye lid " (06/14/2018)   FOOT NEUROMA SURGERY Left    immunotherapy     INGUINAL HERNIA REPAIR Right 02/2018   IR IMAGING GUIDED PORT INSERTION  11/23/2018   NM MYOCAR PERF WALL MOTION  07/11/2008   MILD ISCHEMIA IN THE BASL INFERIOR, MID INFERIOR & APICAL INFERIOR REGIONS   SALIVARY GLAND SURGERY Left 04/2011   "squamous cell cancer attached to it; took the gland out"   SKIN CANCER EXCISION     "arms, hands, face, neck" (06/14/2018)   TRANSURETHRAL RESECTION OF BLADDER TUMOR WITH GYRUS (TURBT-GYRUS)  2010    Allergies  Allergies  Allergen Reactions   Codeine Other (  See Comments)    HEADACHE  headaches    History of Present Illness    Kyle Foster is a 76 y.o. male who presents via audio/video conferencing for a telehealth visit today.  Pt was last seen in cardiology clinic on 12/03/2021 by Dr. Gwenlyn Found.  At that time RAHN LACUESTA was doing well .  The patient is now pending procedure as outlined above. Since his last visit, he remains stable from a cardiac standpoint.  He denies chest pain, shortness of breath, lower extremity edema, fatigue, palpitations, melena, hematuria, hemoptysis,  diaphoresis, weakness, presyncope, syncope, orthopnea, and PND.    Home Medications    Prior to Admission medications   Medication Sig Start Date End Date Taking? Authorizing Provider  acetaminophen (TYLENOL) 500 MG tablet Take 1,000 mg by mouth every 8 (eight) hours as needed for moderate pain.    [provider]  atorvastatin (LIPITOR) 80 MG tablet Take 1 tablet (80 mg total) by mouth daily. 02/20/22   Lorretta Harp, MD  atorvastatin (LIPITOR) 80 MG tablet Take 1 tablet by mouth daily.    [provider]  B Complex-C (B-COMPLEX WITH VITAMIN C) tablet Take 1 tablet by mouth daily.    [provider]  calcium carbonate (TUMS - DOSED IN MG ELEMENTAL CALCIUM) 500 MG chewable tablet Chew 1 tablet (200 mg of elemental calcium total) by mouth 3 (three) times daily with meals. Patient taking differently: Chew 1 tablet by mouth 3 (three) times daily as needed for indigestion. 10/22/18   Nita Sells, MD  cefTRIAXone (ROCEPHIN) 10 g injection  06/23/22   [provider]  chlorhexidine (PERIDEX) 0.12 % solution SMARTSIG:By Mouth 06/15/22   [provider]  Cholecalciferol (VITAMIN D) 2000 UNITS tablet Take 2,000 Units by mouth daily.    [provider]  Cholecalciferol 50 MCG (2000 UT) TABS Take 1 tablet by mouth daily. 04/07/22   [provider]  clopidogrel (PLAVIX) 75 MG tablet Take 1 tablet (75 mg total) by mouth daily. NEEDS APPOINTMENT FOR FUTURE REFILLS 05/23/22   Lorretta Harp, MD  diphenhydrAMINE (BENADRYL) 50 MG/ML injection  06/10/22   [provider]  DULoxetine (CYMBALTA) 30 MG capsule Take 1 capsule by mouth daily. 08/06/21   [provider]  fentaNYL (DURAGESIC) 50 MCG/HR Place 1 patch onto the skin every 3 (three) days. 06/25/22   Brunetta Genera, MD  Fluticasone-Umeclidin-Vilant (TRELEGY ELLIPTA) 100-62.5-25 MCG/ACT AEPB INHALE 1 PUFF BY MOUTH EVERY DAY 06/24/22   Early, Coralee Pesa, NP   Hypromellose (ARTIFICIAL TEARS OP) Place 1 drop into both eyes at bedtime.     [provider]  ipratropium-albuterol (DUONEB) 0.5-2.5 (3) MG/3ML SOLN INHALE 1 VIAL IN NEBULIZER BY MOUTH FOUR TIMES A DAY AS NEEDED 04/07/22   [provider]  Lactobacillus (PROBIOTIC GOLD EXTRA STRENGTH) CAPS Take 1 capsule by mouth daily. 06/23/22   [provider]  lidocaine-prilocaine (EMLA) cream Apply 1 application topically as needed (access port). 12/26/20   Brunetta Genera, MD  loratadine (CLARITIN) 10 MG tablet Take 10 mg by mouth daily as needed for allergies.    [provider]  magnesium oxide (MAG-OX) 400 MG tablet Take 1 tablet (400 mg total) by mouth daily. 07/10/20   Brunetta Genera, MD  metoprolol tartrate (LOPRESSOR) 25 MG tablet TAKE 1 TABLET BY MOUTH TWICE A DAY 03/03/22   Lorretta Harp, MD  metroNIDAZOLE (FLAGYL) 500 MG tablet Take 500 mg by mouth 3 (three) times daily.  06/06/22   [provider]  morphine (MSIR) 15 MG tablet Take 1-2 tablets (15-30 mg total) by mouth every 4 (four) hours as needed for moderate pain or severe pain. 06/25/22   Brunetta Genera, MD  ondansetron (ZOFRAN) 8 MG tablet Take 1 tablet (8 mg total) by mouth 3 (three) times daily as needed. 06/25/22   Brunetta Genera, MD  pantoprazole (PROTONIX) 40 MG tablet Take 1 tablet by mouth daily. 04/07/22   [provider]  Polyvinyl Alcohol (TEARS AGAIN OP) Place 1 drop into the left eye nightly.    [provider]    Physical Exam    Vital Signs:  DEONTAYE CIVELLO does not have vital signs available for review today.  Given telephonic nature of communication, physical exam is limited. AAOx3. NAD. Normal affect.  Speech and respirations are unlabored.  Accessory Clinical Findings    None  Assessment & Plan    1.  Preoperative Cardiovascular Risk Assessment: Fiducial markers, 07/08/2022, Dr. Gloriann Loan, [facility]   Primary Cardiologist:  Quay Burow, MD  Chart reviewed as part of pre-operative protocol coverage. Given past medical history and time since last visit, based on ACC/AHA guidelines, LAVARIUS DOUGHTEN would be at acceptable risk for the planned procedure without further cardiovascular testing.   Patient was advised that if he develops new symptoms prior to surgery to contact our office to arrange a follow-up appointment.  He verbalized understanding.    His Plavix may be held for 5 days prior to his procedure.  Please resume as soon as hemostasis is achieved.  His RCRI is a class II risk, 0.9% risk of major cardiac event.  He is able to complete greater than 4 METS of physical activity.   I will route this recommendation to the requesting party via Epic fax function and remove from pre-op pool.     Time:   Today, I have spent 7 minutes with the patient with telehealth technology discussing medical history, symptoms, and management plan.  Prior to his phone evaluation I spent greater than 10 minutes reviewing his past medical history and medications.   Deberah Pelton, NP  06/30/2022, 7:01 AM

## 2022-07-02 NOTE — Progress Notes (Addendum)
Addendum:  Chart reviewed w/ anesthesia, Dr Cherylynn Ridges MDA,  stated ok to proceed @ [facility] only this one time since pt is stable cardiac wise and due to type and how long procedure is. Dr Valma Cava stated he will speak with the anesthesiologist that will be at [facility] and he is scheduled on [facility] also.   Reviewing pt chart for pre-op interview for surgery scheduled @ [facility] 07-08-2022 for Dr Gloriann Loan.  Noted extensive complex health history including head/ neck cancer.  Last surgery in epic done @ [facility] per anesthesia record pt is ASA IV.  Will review w/ anesthesia to confirm pt not a candidate for surgery center.   On 10-30-202320 --Spoke w/ via phone for pre-op interview---  pt Lab needs dos----  [facility]             Lab results------ current ekg in epic/ chart;  current chest CT in epic COVID test -----patient states asymptomatic no test needed Arrive at -------  0630 on 07-08-2022 NPO after MN  Med rec completed Medications to take morning of surgery ----- cymbalta, protonix, lopressor, trelegy inhaler Diabetic medication ----- n/a Patient instructed no nail polish to be worn day of surgery Patient instructed to bring photo id and insurance card day of surgery Patient aware to have Driver (ride ) / caregiver  for 24 hours after surgery --- wife, [place] Patient Special Instructions ----- pt will do nebulizer night before surgery and do one fleet enema Pre-Op special Istructions ----- pt has tele office visit cardiac clearance by Coletta Memos NP o 06-30-2022 in epic/ chart Patient verbalized understanding of instructions that were given at this phone interview. Patient denies shortness of breath, chest pain, fever, cough at this phone interview.    Anesthesia Review:  See above Dr Valma Cava MDA reviewed chart. [place] for pt @ [facility]  PCP:  [name] NP Cardiologist :  Dr Gwenlyn Found (lov 12-03-2021 epic) [place] oncologist:  Dr [name] Cassell Clement 05-14-2022) [facility] oncology:   ENT head/ neck:  Dr Lenna Sciara. Vicie Mutters  [facility] 06-23-2022) ID:  Dr Alfonso Patten. Lemar Livings [facility] 06-05-2022 ) Chest x-ray : Chest CT 11-15-2021 epic EKG : 12-03-2021 epic Echo : 03-07-2021 epic Stress test: nuclear 06-15-2018 epic Cardiac Cath :  07-03-2008 epic Activity level: denies sob w/ any activity  Blood Thinner/ Instructions Maryjane Hurter Dose:  Plavix ASA / Instructions/ Last Dose :  no Per pt was given instructions by cardiology to stop 5 dys prior , stated last dose 07-02-2022

## 2022-07-03 ENCOUNTER — Encounter (HOSPITAL_BASED_OUTPATIENT_CLINIC_OR_DEPARTMENT_OTHER): Payer: Self-pay | Admitting: Urology

## 2022-07-04 ENCOUNTER — Encounter (HOSPITAL_BASED_OUTPATIENT_CLINIC_OR_DEPARTMENT_OTHER): Payer: Self-pay | Admitting: Urology

## 2022-07-07 ENCOUNTER — Encounter (HOSPITAL_BASED_OUTPATIENT_CLINIC_OR_DEPARTMENT_OTHER): Payer: Self-pay | Admitting: Urology

## 2022-07-07 DIAGNOSIS — I214 Non-ST elevation (NSTEMI) myocardial infarction: Secondary | ICD-10-CM | POA: Diagnosis not present

## 2022-07-07 DIAGNOSIS — C689 Malignant neoplasm of urinary organ, unspecified: Secondary | ICD-10-CM | POA: Diagnosis not present

## 2022-07-07 DIAGNOSIS — C07 Malignant neoplasm of parotid gland: Secondary | ICD-10-CM | POA: Diagnosis not present

## 2022-07-07 DIAGNOSIS — C3490 Malignant neoplasm of unspecified part of unspecified bronchus or lung: Secondary | ICD-10-CM | POA: Diagnosis not present

## 2022-07-07 DIAGNOSIS — H60312 Diffuse otitis externa, left ear: Secondary | ICD-10-CM | POA: Diagnosis not present

## 2022-07-07 DIAGNOSIS — M8788 Other osteonecrosis, other site: Secondary | ICD-10-CM | POA: Diagnosis not present

## 2022-07-08 ENCOUNTER — Encounter (HOSPITAL_BASED_OUTPATIENT_CLINIC_OR_DEPARTMENT_OTHER): Payer: Self-pay | Admitting: Urology

## 2022-07-08 ENCOUNTER — Ambulatory Visit (HOSPITAL_BASED_OUTPATIENT_CLINIC_OR_DEPARTMENT_OTHER): Payer: Medicare Other | Admitting: Anesthesiology

## 2022-07-08 ENCOUNTER — Other Ambulatory Visit: Payer: Self-pay

## 2022-07-08 ENCOUNTER — Ambulatory Visit (HOSPITAL_BASED_OUTPATIENT_CLINIC_OR_DEPARTMENT_OTHER)
Admission: RE | Admit: 2022-07-08 | Discharge: 2022-07-08 | Disposition: A | Discharge status: Home / Self Care | Payer: Medicare Other | Source: Ambulatory Visit | Attending: Urology | Admitting: Urology

## 2022-07-08 ENCOUNTER — Encounter (HOSPITAL_BASED_OUTPATIENT_CLINIC_OR_DEPARTMENT_OTHER)
Admission: RE | Disposition: A | Discharge status: Home / Self Care | Payer: Self-pay | Source: Ambulatory Visit | Attending: Urology

## 2022-07-08 DIAGNOSIS — Z7902 Long term (current) use of antithrombotics/antiplatelets: Secondary | ICD-10-CM | POA: Diagnosis not present

## 2022-07-08 DIAGNOSIS — G8929 Other chronic pain: Secondary | ICD-10-CM | POA: Diagnosis not present

## 2022-07-08 DIAGNOSIS — C61 Malignant neoplasm of prostate: Secondary | ICD-10-CM | POA: Insufficient documentation

## 2022-07-08 DIAGNOSIS — F17210 Nicotine dependence, cigarettes, uncomplicated: Secondary | ICD-10-CM | POA: Diagnosis not present

## 2022-07-08 DIAGNOSIS — Z01818 Encounter for other preprocedural examination: Secondary | ICD-10-CM

## 2022-07-08 DIAGNOSIS — I2510 Atherosclerotic heart disease of native coronary artery without angina pectoris: Secondary | ICD-10-CM | POA: Diagnosis not present

## 2022-07-08 DIAGNOSIS — K219 Gastro-esophageal reflux disease without esophagitis: Secondary | ICD-10-CM | POA: Diagnosis not present

## 2022-07-08 DIAGNOSIS — F17200 Nicotine dependence, unspecified, uncomplicated: Secondary | ICD-10-CM | POA: Diagnosis not present

## 2022-07-08 DIAGNOSIS — I10 Essential (primary) hypertension: Secondary | ICD-10-CM | POA: Diagnosis not present

## 2022-07-08 DIAGNOSIS — Z85118 Personal history of other malignant neoplasm of bronchus and lung: Secondary | ICD-10-CM | POA: Diagnosis not present

## 2022-07-08 DIAGNOSIS — Z8589 Personal history of malignant neoplasm of other organs and systems: Secondary | ICD-10-CM | POA: Insufficient documentation

## 2022-07-08 DIAGNOSIS — C7951 Secondary malignant neoplasm of bone: Secondary | ICD-10-CM | POA: Diagnosis not present

## 2022-07-08 DIAGNOSIS — J449 Chronic obstructive pulmonary disease, unspecified: Secondary | ICD-10-CM | POA: Diagnosis not present

## 2022-07-08 DIAGNOSIS — Z79899 Other long term (current) drug therapy: Secondary | ICD-10-CM | POA: Diagnosis not present

## 2022-07-08 HISTORY — DX: Long term (current) use of anticoagulants: Z79.01

## 2022-07-08 HISTORY — DX: Bell's palsy: G51.0

## 2022-07-08 HISTORY — PX: GOLD SEED IMPLANT: SHX6343

## 2022-07-08 HISTORY — PX: SPACE OAR INSTILLATION: SHX6769

## 2022-07-08 HISTORY — DX: Diffuse otitis externa, left ear: H60.312

## 2022-07-08 HISTORY — DX: Encounter for antineoplastic immunotherapy: Z51.12

## 2022-07-08 HISTORY — DX: Secondary malignant neoplasm of bone: C79.51

## 2022-07-08 HISTORY — DX: Personal history of irradiation: Z92.3

## 2022-07-08 HISTORY — DX: Other chronic pain: G89.29

## 2022-07-08 HISTORY — DX: Emphysema, unspecified: J43.9

## 2022-07-08 HISTORY — DX: Personal history of other malignant neoplasm of skin: Z85.828

## 2022-07-08 HISTORY — DX: Malignant neoplasm of prostate: C61

## 2022-07-08 HISTORY — DX: Radiological procedure and radiotherapy as the cause of abnormal reaction of the patient, or of later complication, without mention of misadventure at the time of the procedure: Y84.2

## 2022-07-08 HISTORY — DX: Radiological procedure and radiotherapy as the cause of abnormal reaction of the patient, or of later complication, without mention of misadventure at the time of the procedure: M87.38

## 2022-07-08 HISTORY — DX: Personal history of antineoplastic chemotherapy: Z92.21

## 2022-07-08 LAB — POCT I-STAT, CHEM 8
BUN: 16 mg/dL (ref 8–23)
Calcium, Ion: 1.21 mmol/L (ref 1.15–1.40)
Chloride: 101 mmol/L (ref 98–111)
Creatinine, Ser: 0.9 mg/dL (ref 0.61–1.24)
Glucose, Bld: 93 mg/dL (ref 70–99)
HCT: 36 % — ABNORMAL LOW (ref 39.0–52.0)
Hemoglobin: 12.2 g/dL — ABNORMAL LOW (ref 13.0–17.0)
Potassium: 4 mmol/L (ref 3.5–5.1)
Sodium: 140 mmol/L (ref 135–145)
TCO2: 30 mmol/L (ref 22–32)

## 2022-07-08 SURGERY — INSERTION, GOLD SEEDS
Anesthesia: Monitor Anesthesia Care | Site: Prostate

## 2022-07-08 MED ORDER — ONDANSETRON HCL 4 MG/2ML IJ SOLN: 4.0000 mg | Freq: Once | INTRAMUSCULAR | Status: DC | PRN

## 2022-07-08 MED ORDER — FENTANYL CITRATE (PF) 100 MCG/2ML IJ SOLN: 25.0000 ug | INTRAMUSCULAR | Status: DC | PRN

## 2022-07-08 MED ORDER — ONDANSETRON HCL 4 MG/2ML IJ SOLN
INTRAMUSCULAR | Status: DC | PRN
  Administered 2022-07-08: 4 mg via INTRAVENOUS

## 2022-07-08 MED ORDER — CEFAZOLIN SODIUM-DEXTROSE 2-4 GM/100ML-% IV SOLN
INTRAVENOUS | Status: AC
  Filled 2022-07-08: qty 100

## 2022-07-08 MED ORDER — LACTATED RINGERS IV SOLN: INTRAVENOUS | Status: DC

## 2022-07-08 MED ORDER — OXYCODONE HCL 5 MG/5ML PO SOLN: 5.0000 mg | Freq: Once | ORAL | Status: DC | PRN

## 2022-07-08 MED ORDER — PROPOFOL 10 MG/ML IV BOLUS
INTRAVENOUS | Status: DC | PRN
  Administered 2022-07-08 (×2): 10 mg via INTRAVENOUS

## 2022-07-08 MED ORDER — FENTANYL CITRATE (PF) 250 MCG/5ML IJ SOLN
INTRAMUSCULAR | Status: DC | PRN
  Administered 2022-07-08 (×2): 25 ug via INTRAVENOUS

## 2022-07-08 MED ORDER — ACETAMINOPHEN 500 MG PO TABS: 1000.0000 mg | ORAL_TABLET | Freq: Once | ORAL | Status: DC

## 2022-07-08 MED ORDER — CEFAZOLIN SODIUM-DEXTROSE 2-4 GM/100ML-% IV SOLN
2.0000 g | INTRAVENOUS | Status: AC
  Administered 2022-07-08: 2 g via INTRAVENOUS

## 2022-07-08 MED ORDER — PROPOFOL 500 MG/50ML IV EMUL
INTRAVENOUS | Status: DC | PRN
  Administered 2022-07-08: 150 ug/kg/min via INTRAVENOUS

## 2022-07-08 MED ORDER — FENTANYL CITRATE (PF) 100 MCG/2ML IJ SOLN
INTRAMUSCULAR | Status: AC
  Filled 2022-07-08: qty 2

## 2022-07-08 MED ORDER — OXYCODONE HCL 5 MG PO TABS: 5.0000 mg | ORAL_TABLET | Freq: Once | ORAL | Status: DC | PRN

## 2022-07-08 MED ORDER — SODIUM CHLORIDE (PF) 0.9 % IJ SOLN
INTRAMUSCULAR | Status: DC | PRN
  Administered 2022-07-08: 10 mL

## 2022-07-08 SURGICAL SUPPLY — 26 items
BLADE CLIPPER SENSICLIP SURGIC (BLADE) ×2 IMPLANT
CNTNR URN SCR LID CUP LEK RST (MISCELLANEOUS) ×2 IMPLANT
CONT SPEC 4OZ STRL OR WHT (MISCELLANEOUS) ×2
COVER BACK TABLE 60X90IN (DRAPES) ×2 IMPLANT
DRSG TEGADERM 4X4.75 (GAUZE/BANDAGES/DRESSINGS) ×2 IMPLANT
DRSG TEGADERM 8X12 (GAUZE/BANDAGES/DRESSINGS) ×2 IMPLANT
GAUZE SPONGE 4X4 12PLY STRL (GAUZE/BANDAGES/DRESSINGS) ×2 IMPLANT
GAUZE SPONGE 4X4 12PLY STRL LF (GAUZE/BANDAGES/DRESSINGS) IMPLANT
GLOVE BIO SURGEON STRL SZ7.5 (GLOVE) ×2 IMPLANT
GLOVE ECLIPSE 8.0 STRL XLNG CF (GLOVE) ×2 IMPLANT
GLOVE SURG ORTHO 8.5 STRL (GLOVE) ×2 IMPLANT
IMPL SPACEOAR VUE SYSTEM (Spacer) ×2 IMPLANT
IMPLANT SPACEOAR VUE SYSTEM (Spacer) ×2 IMPLANT
KIT TURNOVER CYSTO (KITS) ×2 IMPLANT
MARKER GOLD PRELOAD 1.2X3 (Urological Implant) ×2 IMPLANT
MARKER SKIN DUAL TIP RULER LAB (MISCELLANEOUS) ×2 IMPLANT
NDL SPNL 22GX3.5 QUINCKE BK (NEEDLE) IMPLANT
NEEDLE SPNL 22GX3.5 QUINCKE BK (NEEDLE) IMPLANT
SEED GOLD PRELOAD 1.2X3 (Urological Implant) ×6 IMPLANT
SHEATH ULTRASOUND LF (SHEATH) IMPLANT
SHEATH ULTRASOUND LTX NONSTRL (SHEATH) IMPLANT
SURGILUBE 2OZ TUBE FLIPTOP (MISCELLANEOUS) ×2 IMPLANT
SYR 10ML LL (SYRINGE) IMPLANT
SYR CONTROL 10ML LL (SYRINGE) ×2 IMPLANT
TOWEL OR 17X26 10 PK STRL BLUE (TOWEL DISPOSABLE) ×2 IMPLANT
UNDERPAD 30X36 HEAVY ABSORB (UNDERPADS AND DIAPERS) ×2 IMPLANT

## 2022-07-08 NOTE — Op Note (Signed)
Preoperative diagnosis: adenocarcinoma of the prostate   Postoperative diagnosis: adenocarcinoma of the prostate  Procedure: 1) Placement of fiducial markers into prostate                    2) Insertion of SpaceOAR hydrogel   Surgeon: Link Snuffer, M.D.  Anesthesia: MAC sedation  EBL: Minimal  Complications: None  Indication: Kyle Foster is a 76 y.o. gentleman with clinically localized prostate cancer. After discussing management options for treatment, he elected to proceed with radiotherapy. He presents today for the above procedures. The potential risks, complications, alternative options, and expected recovery course have been discussed in detail with the patient and he has provided informed consent to proceed.  Description of procedure: The patient was administered preoperative antibiotics, placed in the dorsal lithotomy position, and prepped and draped in the usual sterile fashion. Next, transrectal ultrasonography was utilized to visualize the prostate.  The perineum was anesthetized with quarter percent Marcaine. Three gold fiducial markers were then placed into the prostate via transperineal needles under ultrasound guidance at the right apex, right base, and left mid gland under direct ultrasound guidance. A site in the midline was then selected on the perineum for placement of an 18 g needle with saline. The needle was advanced above the rectum and below Denonvillier's fascia to the mid gland and confirmed to be in the midline on transverse imaging. One cc of saline was injected confirming appropriate expansion of this space. A total of 5 cc of saline was then injected to open the space further bilaterally. The saline syringe was then removed and the SpaceOAR hydrogel was injected with good distribution bilaterally. He tolerated the procedure well and without complications. He was given a voiding trial prior to discharge from the PACU.
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
## 2022-07-08 NOTE — Assessment & Plan Note (Signed)
Review of current and past medical history, social history, medication, and family history.  Review of care gaps and health maintenance recommendations.  Records from recent providers to be requested if not available in Chart Review or Care Everywhere.  Recommendations for health maintenance, diet, and exercise provided.  In depth discussion of medical history and current treatments. Will plan to continue to review record and evaluate for needs. F/U in 3 months for chronic care management.

## 2022-07-08 NOTE — Discharge Instructions (Addendum)
Diet Resume your usual diet when you return home. To keep your bowels moving easily and softly, drink prune, apple and cranberry juice at room temperature. You may also take a stool softener, such as Colace, which is available without prescription at local pharmacies. Daily activities No driving or heavy lifting for at least two days after the implant. No bike riding, horseback riding or riding lawn mowers for the first month after the implant. Any strenuous physical activity should be approved by your doctor before you resume it. Sexual relations You may resume sexual relations two weeks after the procedure. Your semen may be dark brown or black; this is normal and is related bleeding that may have occurred during the implant. Postoperative swelling Expect swelling and bruising of the scrotum and perineum (the area between the scrotum and anus). Both the swelling and the bruising should resolve in l or 2 weeks. Ice packs and over- the-counter medications such as Tylenol, Advil or Aleve may lessen your discomfort. Postoperative urination Most men experience burning on urination and/or urinary frequency. If this becomes bothersome, contact your Urologist.  Medication can be prescribed to relieve these problems.  It is normal to have some blood in your urine for a few days after the implant.  Contact your doctor for Temperature greater than 101 F Increasing pain Inability to urinate        Post Anesthesia Home Care Instructions  Activity: Get plenty of rest for the remainder of the day. A responsible individual must stay with you for 24 hours following the procedure.  For the next 24 hours, DO NOT: -Drive a car -Paediatric nurse -Drink alcoholic beverages -Take any medication unless instructed by your physician -Make any legal decisions or sign important papers.  Meals: Start with liquid foods such as gelatin or soup. Progress to regular foods as tolerated. Avoid greasy, spicy,  heavy foods. If nausea and/or vomiting occur, drink only clear liquids until the nausea and/or vomiting subsides. Call your physician if vomiting continues.  Special Instructions/Symptoms: Your throat may feel dry or sore from the anesthesia or the breathing tube placed in your throat during surgery. If this causes discomfort, gargle with warm salt water. The discomfort should disappear within 24 hours.

## 2022-07-08 NOTE — Anesthesia Postprocedure Evaluation (Signed)
Anesthesia Post Note  Patient: Kyle Foster  Procedure(s) Performed: GOLD SEED IMPLANT (Prostate) SPACE OAR INSTILLATION (Perineum)     Patient location during evaluation: PACU Anesthesia Type: MAC Level of consciousness: awake and alert Pain management: pain level controlled Vital Signs Assessment: post-procedure vital signs reviewed and stable Respiratory status: spontaneous breathing, nonlabored ventilation and respiratory function stable Cardiovascular status: stable and blood pressure returned to baseline Anesthetic complications: no   No notable events documented.  Last Vitals:  Vitals:   07/08/22 1000 07/08/22 1045  BP: (!) 126/53 (!) 123/49  Pulse: 64 67  Resp: 16 13  Temp: (!) 36.4 C (!) 36.4 C  SpO2: 100% 97%    Last Pain:  Vitals:   07/08/22 1045  TempSrc:   PainSc: 0-No pain                 Audry Pili

## 2022-07-08 NOTE — Transfer of Care (Signed)
Immediate Anesthesia Transfer of Care Note  Patient: Kyle Foster  Procedure(s) Performed: GOLD SEED IMPLANT (Prostate) SPACE OAR INSTILLATION (Perineum)  Patient Location: PACU  Anesthesia Type:MAC  Level of Consciousness: awake, alert  and oriented  Airway & Oxygen Therapy: Patient Spontanous Breathing  Post-op Assessment: Report given to RN and Post -op Vital signs reviewed and stable  Post vital signs: Reviewed and stable  Last Vitals:  Vitals Value Taken Time  BP 129/49 07/08/22 0931  Temp    Pulse 56 07/08/22 0932  Resp    SpO2 100 % 07/08/22 0932  Vitals shown include unvalidated device data.  Last Pain:  Vitals:   07/08/22 0706  TempSrc: Oral      Patients Stated Pain Goal: 4 (75/43/60 6770)  Complications: No notable events documented.

## 2022-07-08 NOTE — Anesthesia Preprocedure Evaluation (Addendum)
Anesthesia Evaluation  Patient identified by MRN, date of birth, ID band Patient awake    Reviewed: Allergy & Precautions, NPO status , Patient's Chart, lab work & pertinent test results, reviewed documented beta blocker date and time   History of Anesthesia Complications Negative for: history of anesthetic complications  Airway Mallampati: IV  TM Distance: >3 FB Neck ROM: Limited  Mouth opening: Limited Mouth Opening  Dental  (+) Edentulous Upper, Edentulous Lower   Pulmonary COPD,  COPD inhaler, Current SmokerPatient did not abstain from smoking.,   SCC lung    Pulmonary exam normal        Cardiovascular hypertension, Pt. on home beta blockers and Pt. on medications + CAD  Normal cardiovascular exam     Neuro/Psych negative neurological ROS  negative psych ROS   GI/Hepatic Neg liver ROS, GERD  Medicated and Controlled,  Endo/Other   Parotid cancer   Renal/GU negative Renal ROS    Prostate cancer     Musculoskeletal  Bone metastases    Abdominal   Peds  Hematology  On plavix    Anesthesia Other Findings Chronic pain   Reproductive/Obstetrics                            Anesthesia Physical Anesthesia Plan  ASA: 3  Anesthesia Plan: MAC   Post-op Pain Management: Tylenol PO (pre-op)*   Induction:   PONV Risk Score and Plan: 0 and Propofol infusion and Treatment may vary due to age or medical condition  Airway Management Planned: Natural Airway and Simple Face Mask  Additional Equipment: None  Intra-op Plan:   Post-operative Plan:   Informed Consent: I have reviewed the patients History and Physical, chart, labs and discussed the procedure including the risks, benefits and alternatives for the proposed anesthesia with the patient or authorized representative who has indicated his/her understanding and acceptance.       Plan Discussed with: CRNA and  Anesthesiologist  Anesthesia Plan Comments:       Anesthesia Quick Evaluation

## 2022-07-09 ENCOUNTER — Encounter (HOSPITAL_BASED_OUTPATIENT_CLINIC_OR_DEPARTMENT_OTHER): Payer: Self-pay | Admitting: Urology

## 2022-07-10 ENCOUNTER — Encounter (HOSPITAL_BASED_OUTPATIENT_CLINIC_OR_DEPARTMENT_OTHER): Payer: Self-pay | Admitting: Nurse Practitioner

## 2022-07-10 ENCOUNTER — Telehealth: Payer: Self-pay | Admitting: *Deleted

## 2022-07-10 DIAGNOSIS — K4090 Unilateral inguinal hernia, without obstruction or gangrene, not specified as recurrent: Secondary | ICD-10-CM | POA: Insufficient documentation

## 2022-07-10 DIAGNOSIS — C4491 Basal cell carcinoma of skin, unspecified: Secondary | ICD-10-CM | POA: Insufficient documentation

## 2022-07-10 DIAGNOSIS — H269 Unspecified cataract: Secondary | ICD-10-CM | POA: Insufficient documentation

## 2022-07-10 DIAGNOSIS — F419 Anxiety disorder, unspecified: Secondary | ICD-10-CM | POA: Insufficient documentation

## 2022-07-10 NOTE — Assessment & Plan Note (Signed)
Chronic pain currently managed with fentanyl patch and morphine as needed.  Cancer is located in multiple areas which very likely makes it more difficult to manage.  No alarm symptoms present at this time.  We will continue to monitor.

## 2022-07-10 NOTE — Telephone Encounter (Signed)
CALLED PATIENT TO REMIND OF SIM APPT. FOR 07-11-22- ARRIVAL TIME- 8:45 AM @ [facility], INFORMED PATIENT TO ARRIVE WITH A FULL BLADDER, LVM FOR A RETURN CALL

## 2022-07-10 NOTE — Assessment & Plan Note (Signed)
Chronic. Recommend continue with current medications. Avoid irritants that may worsen condition or cause exacerbation. Vaccines completed with exception of newest COVID vaccine. Defer recommendation for this to cardiology given his current health status. Continue current plan of care with adjustments as needed to meet goal of symptom control thereby increasing quality of life. Follow-up Kyle Foster with any symptoms of exacerbation or worsening of symptoms. Will continue to collaborate care with pulmonology.

## 2022-07-10 NOTE — Assessment & Plan Note (Signed)
Chronic.  Current BMI 17.94.  Recommend high-protein meal supplementation such as boost or Ensure between meals to increase protein intake.  Recent labs with oncology.  Repeat today.  We will follow closely.

## 2022-07-10 NOTE — Assessment & Plan Note (Signed)
Previous surgical excision of parotid gland.  No alarm symptoms present at this time.

## 2022-07-10 NOTE — Progress Notes (Signed)
  Radiation Oncology         [phone] ________________________________  Name: UCHENNA RAPPAPORT MRN: [id number]  Date: 07/11/2022  DOB: 19-Oct-1945  SIMULATION AND TREATMENT PLANNING NOTE    ICD-10-CM   1. Malignant neoplasm of prostate ([place])  C61       DIAGNOSIS:  76 y.o. 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   NARRATIVE:  The patient was brought to the [place].  Identity was confirmed.  All relevant records and images related to the planned course of therapy were reviewed.  The patient freely provided informed written consent to proceed with treatment after reviewing the details related to the planned course of therapy. The consent form was witnessed and verified by the simulation staff.  Then, the patient was set-up in a stable reproducible supine position for radiation therapy.  A vacuum lock pillow device was custom fabricated to position his legs in a reproducible immobilized position.  Then, I performed a urethrogram under sterile conditions to identify the prostatic apex.  CT images were obtained.  Surface markings were placed.  The CT images were loaded into the planning software.  Then the prostate target and avoidance structures including the rectum, bladder, bowel and hips were contoured.  Treatment planning then occurred.  The radiation prescription was entered and confirmed.  A total of one complex treatment devices was fabricated. I have requested : Intensity Modulated Radiotherapy (IMRT) is medically necessary for this case for the following reason:  Rectal sparing.Marland Kitchen  PLAN:  The patient will receive 70 Gy in 28 fractions.  ________________________________  Sheral Apley Tammi Klippel, M.D.

## 2022-07-10 NOTE — Assessment & Plan Note (Signed)
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  Actively undergoing treatment for this.  We will continue to follow and support oncology.

## 2022-07-10 NOTE — Assessment & Plan Note (Signed)
Recent diagnosis.  Appears to be metastasis from lung cancer.  He is following closely with neurology and hematology.  No alarm symptoms present at this time.  We will continue to provide support to patient and specialist providers

## 2022-07-10 NOTE — Assessment & Plan Note (Signed)
Chronic.  No alarm symptoms present at this time.  Goal blood pressure less than less than 130/80.  Refills have not been provided today.  Labs today to check electrolytes and kidney function.  Recommend current treatment plan is effective, no change in therapy, orders and follow up as documented in EpicCare, reviewed diet, exercise and weight control, labs ordered and recent results reviewed with patient. Currently is followed with cardiology. We will make changes to plan of care as necessary based on lab results. Plan to follow-up in 36months.

## 2022-07-10 NOTE — Assessment & Plan Note (Signed)
Active diagnosis. He has completed chemotherapy and is almost complete with radiation. No alarm sx. Followed closely with oncology. 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 Pain managed through oncology. Recommend close follow-up and monitoring. Will assist oncology in any way I can.

## 2022-07-11 ENCOUNTER — Ambulatory Visit
Admission: RE | Admit: 2022-07-11 | Discharge: 2022-07-11 | Disposition: A | Discharge status: Home / Self Care | Payer: Medicare Other | Source: Ambulatory Visit | Attending: Radiation Oncology | Admitting: Radiation Oncology

## 2022-07-11 DIAGNOSIS — I214 Non-ST elevation (NSTEMI) myocardial infarction: Secondary | ICD-10-CM | POA: Diagnosis not present

## 2022-07-11 DIAGNOSIS — C3490 Malignant neoplasm of unspecified part of unspecified bronchus or lung: Secondary | ICD-10-CM | POA: Diagnosis not present

## 2022-07-11 DIAGNOSIS — H60312 Diffuse otitis externa, left ear: Secondary | ICD-10-CM | POA: Diagnosis not present

## 2022-07-11 DIAGNOSIS — Z510 Encounter for antineoplastic radiation therapy: Secondary | ICD-10-CM | POA: Diagnosis not present

## 2022-07-11 DIAGNOSIS — G510 Bell's palsy: Secondary | ICD-10-CM | POA: Diagnosis not present

## 2022-07-11 DIAGNOSIS — I2510 Atherosclerotic heart disease of native coronary artery without angina pectoris: Secondary | ICD-10-CM | POA: Diagnosis not present

## 2022-07-11 DIAGNOSIS — Z7901 Long term (current) use of anticoagulants: Secondary | ICD-10-CM | POA: Diagnosis not present

## 2022-07-11 DIAGNOSIS — Z556 Problems related to health literacy: Secondary | ICD-10-CM | POA: Diagnosis not present

## 2022-07-11 DIAGNOSIS — M8788 Other osteonecrosis, other site: Secondary | ICD-10-CM | POA: Diagnosis not present

## 2022-07-11 DIAGNOSIS — E785 Hyperlipidemia, unspecified: Secondary | ICD-10-CM | POA: Diagnosis not present

## 2022-07-11 DIAGNOSIS — C07 Malignant neoplasm of parotid gland: Secondary | ICD-10-CM | POA: Diagnosis not present

## 2022-07-11 DIAGNOSIS — C689 Malignant neoplasm of urinary organ, unspecified: Secondary | ICD-10-CM | POA: Diagnosis not present

## 2022-07-11 DIAGNOSIS — Z7902 Long term (current) use of antithrombotics/antiplatelets: Secondary | ICD-10-CM | POA: Diagnosis not present

## 2022-07-11 DIAGNOSIS — F17210 Nicotine dependence, cigarettes, uncomplicated: Secondary | ICD-10-CM | POA: Diagnosis not present

## 2022-07-11 DIAGNOSIS — C61 Malignant neoplasm of prostate: Secondary | ICD-10-CM | POA: Diagnosis not present

## 2022-07-11 DIAGNOSIS — Z79891 Long term (current) use of opiate analgesic: Secondary | ICD-10-CM | POA: Diagnosis not present

## 2022-07-11 DIAGNOSIS — H02206 Unspecified lagophthalmos left eye, unspecified eyelid: Secondary | ICD-10-CM | POA: Diagnosis not present

## 2022-07-15 DIAGNOSIS — H60312 Diffuse otitis externa, left ear: Secondary | ICD-10-CM | POA: Diagnosis not present

## 2022-07-15 DIAGNOSIS — C3490 Malignant neoplasm of unspecified part of unspecified bronchus or lung: Secondary | ICD-10-CM | POA: Diagnosis not present

## 2022-07-15 DIAGNOSIS — C07 Malignant neoplasm of parotid gland: Secondary | ICD-10-CM | POA: Diagnosis not present

## 2022-07-15 DIAGNOSIS — I214 Non-ST elevation (NSTEMI) myocardial infarction: Secondary | ICD-10-CM | POA: Diagnosis not present

## 2022-07-15 DIAGNOSIS — C689 Malignant neoplasm of urinary organ, unspecified: Secondary | ICD-10-CM | POA: Diagnosis not present

## 2022-07-15 DIAGNOSIS — M8788 Other osteonecrosis, other site: Secondary | ICD-10-CM | POA: Diagnosis not present

## 2022-07-16 ENCOUNTER — Other Ambulatory Visit: Payer: Self-pay

## 2022-07-16 ENCOUNTER — Inpatient Hospital Stay: Payer: No Typology Code available for payment source | Attending: Hematology

## 2022-07-16 ENCOUNTER — Inpatient Hospital Stay (HOSPITAL_BASED_OUTPATIENT_CLINIC_OR_DEPARTMENT_OTHER): Payer: No Typology Code available for payment source | Admitting: Physician Assistant

## 2022-07-16 ENCOUNTER — Inpatient Hospital Stay: Payer: No Typology Code available for payment source

## 2022-07-16 VITALS — BP 126/71 | HR 68 | Temp 97.5°F | Resp 14 | Wt 124.3 lb

## 2022-07-16 VITALS — BP 111/57 | HR 72 | Resp 16

## 2022-07-16 DIAGNOSIS — C3412 Malignant neoplasm of upper lobe, left bronchus or lung: Secondary | ICD-10-CM | POA: Insufficient documentation

## 2022-07-16 DIAGNOSIS — F17210 Nicotine dependence, cigarettes, uncomplicated: Secondary | ICD-10-CM | POA: Insufficient documentation

## 2022-07-16 DIAGNOSIS — C7951 Secondary malignant neoplasm of bone: Secondary | ICD-10-CM | POA: Insufficient documentation

## 2022-07-16 DIAGNOSIS — Z7189 Other specified counseling: Secondary | ICD-10-CM

## 2022-07-16 DIAGNOSIS — Z79899 Other long term (current) drug therapy: Secondary | ICD-10-CM | POA: Insufficient documentation

## 2022-07-16 DIAGNOSIS — Z5112 Encounter for antineoplastic immunotherapy: Secondary | ICD-10-CM | POA: Diagnosis present

## 2022-07-16 DIAGNOSIS — H60319 Diffuse otitis externa, unspecified ear: Secondary | ICD-10-CM | POA: Diagnosis not present

## 2022-07-16 DIAGNOSIS — Z8551 Personal history of malignant neoplasm of bladder: Secondary | ICD-10-CM | POA: Diagnosis not present

## 2022-07-16 DIAGNOSIS — C61 Malignant neoplasm of prostate: Secondary | ICD-10-CM | POA: Diagnosis not present

## 2022-07-16 DIAGNOSIS — Z95828 Presence of other vascular implants and grafts: Secondary | ICD-10-CM

## 2022-07-16 DIAGNOSIS — C3492 Malignant neoplasm of unspecified part of left bronchus or lung: Secondary | ICD-10-CM

## 2022-07-16 DIAGNOSIS — G893 Neoplasm related pain (acute) (chronic): Secondary | ICD-10-CM | POA: Insufficient documentation

## 2022-07-16 DIAGNOSIS — Z510 Encounter for antineoplastic radiation therapy: Secondary | ICD-10-CM | POA: Diagnosis not present

## 2022-07-16 LAB — CMP (CANCER CENTER ONLY)
ALT: 12 U/L (ref 0–44)
AST: 21 U/L (ref 15–41)
Albumin: 3.6 g/dL (ref 3.5–5.0)
Alkaline Phosphatase: 64 U/L (ref 38–126)
Anion gap: 5 (ref 5–15)
BUN: 19 mg/dL (ref 8–23)
CO2: 31 mmol/L (ref 22–32)
Calcium: 9.3 mg/dL (ref 8.9–10.3)
Chloride: 101 mmol/L (ref 98–111)
Creatinine: 0.87 mg/dL (ref 0.61–1.24)
GFR, Estimated: >60 mL/min (ref >60)
Glucose, Bld: 117 mg/dL — ABNORMAL HIGH (ref 70–99)
Potassium: 4.5 mmol/L (ref 3.5–5.1)
Sodium: 137 mmol/L (ref 135–145)
Total Bilirubin: 0.4 mg/dL (ref 0.3–1.2)
Total Protein: 6.6 g/dL (ref 6.5–8.1)

## 2022-07-16 LAB — CBC WITH DIFFERENTIAL (CANCER CENTER ONLY)
Abs Immature Granulocytes: 0.01 10*3/uL (ref 0.00–0.07)
Basophils Absolute: 0 10*3/uL (ref 0.0–0.1)
Basophils Relative: 1 %
Eosinophils Absolute: 0.1 10*3/uL (ref 0.0–0.5)
Eosinophils Relative: 2 %
HCT: 35.3 % — ABNORMAL LOW (ref 39.0–52.0)
Hemoglobin: 12 g/dL — ABNORMAL LOW (ref 13.0–17.0)
Immature Granulocytes: 0 %
Lymphocytes Relative: 7 %
Lymphs Abs: 0.5 10*3/uL — ABNORMAL LOW (ref 0.7–4.0)
MCH: 32.3 pg (ref 26.0–34.0)
MCHC: 34 g/dL (ref 30.0–36.0)
MCV: 94.9 fL (ref 80.0–100.0)
Monocytes Absolute: 0.7 10*3/uL (ref 0.1–1.0)
Monocytes Relative: 10 %
Neutro Abs: 6 10*3/uL (ref 1.7–7.7)
Neutrophils Relative %: 80 %
Platelet Count: 206 10*3/uL (ref 150–400)
RBC: 3.72 MIL/uL — ABNORMAL LOW (ref 4.22–5.81)
RDW: 14.4 % (ref 11.5–15.5)
WBC Count: 7.4 10*3/uL (ref 4.0–10.5)
nRBC: 0 % (ref 0.0–0.2)

## 2022-07-16 MED ORDER — FAMOTIDINE 20 MG PO TABS
20.0000 mg | ORAL_TABLET | Freq: Once | ORAL | Status: AC
  Administered 2022-07-16: 20 mg via ORAL
  Filled 2022-07-16: qty 1

## 2022-07-16 MED ORDER — SODIUM CHLORIDE 0.9 % IV SOLN: Freq: Once | INTRAVENOUS | Status: AC

## 2022-07-16 MED ORDER — DIPHENHYDRAMINE HCL 25 MG PO CAPS
25.0000 mg | ORAL_CAPSULE | Freq: Once | ORAL | Status: AC
  Administered 2022-07-16: 25 mg via ORAL
  Filled 2022-07-16: qty 1

## 2022-07-16 MED ORDER — SODIUM CHLORIDE 0.9% FLUSH
10.0000 mL | Freq: Once | INTRAVENOUS | Status: AC
  Administered 2022-07-16: 10 mL

## 2022-07-16 MED ORDER — SODIUM CHLORIDE 0.9 % IV SOLN
200.0000 mg | Freq: Once | INTRAVENOUS | Status: AC
  Administered 2022-07-16: 200 mg via INTRAVENOUS
  Filled 2022-07-16: qty 200
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
## 2022-07-16 NOTE — Patient Instructions (Signed)
[facility] ONCOLOGY  Discharge Instructions: Thank you for choosing [facility] to provide your oncology and hematology care.   If you have a lab appointment with the [facility], please go directly to the [facility] and check in at the registration area.   Wear comfortable clothing and clothing appropriate for easy access to any Portacath or PICC line.   We strive to give you quality time with your provider. You may need to reschedule your appointment if you arrive late (15 or more minutes).  Arriving late affects you and other patients whose appointments are after yours.  Also, if you miss three or more appointments without notifying the office, you may be dismissed from the clinic at the provider's discretion.      For prescription refill requests, have your pharmacy contact our office and allow 72 hours for refills to be completed.    Today you received the following chemotherapy and/or immunotherapy agents: pembrolizumab      To help prevent nausea and vomiting after your treatment, we encourage you to take your nausea medication as directed.  BELOW ARE SYMPTOMS THAT SHOULD BE REPORTED IMMEDIATELY: *FEVER GREATER THAN 100.4 F (38 C) OR HIGHER *CHILLS OR SWEATING *NAUSEA AND VOMITING THAT IS NOT CONTROLLED WITH YOUR NAUSEA MEDICATION *UNUSUAL SHORTNESS OF BREATH *UNUSUAL BRUISING OR BLEEDING *URINARY PROBLEMS (pain or burning when urinating, or frequent urination) *BOWEL PROBLEMS (unusual diarrhea, constipation, pain near the anus) TENDERNESS IN MOUTH AND THROAT WITH OR WITHOUT PRESENCE OF ULCERS (sore throat, sores in mouth, or a toothache) UNUSUAL RASH, SWELLING OR PAIN  UNUSUAL VAGINAL DISCHARGE OR ITCHING   Items with * indicate a potential emergency and should be followed up as soon as possible or go to the Emergency Department if any problems should occur.  Please show the CHEMOTHERAPY ALERT CARD or IMMUNOTHERAPY ALERT CARD at check-in  to the Emergency Department and triage nurse.  Should you have questions after your visit or need to cancel or reschedule your appointment, please contact [facility]  Dept: [phone]  and follow the prompts.  Office hours are 8:00 a.m. to 4:30 p.m. Monday - Friday. Please note that voicemails left after 4:00 p.m. may not be returned until the following business day.  We are closed weekends and major holidays. You have access to a nurse at all times for urgent questions. Please call the main number to the clinic Dept: [phone] and follow the prompts.   For any non-urgent questions, you may also contact your provider using MyChart. We now offer e-Visits for anyone 70 and older to request care online for non-urgent symptoms. For details visit [email or web address].   Also download the MyChart app! Go to the app store, search "MyChart", open the app, select [facility], and log in with your MyChart username and password.  Masks are optional in the cancer centers. If you would like for your care team to wear a mask while they are taking care of you, please let them know. You may have one support person who is at least 75 years old accompany you for your appointments.

## 2022-07-17 DIAGNOSIS — C07 Malignant neoplasm of parotid gland: Secondary | ICD-10-CM | POA: Diagnosis not present

## 2022-07-17 DIAGNOSIS — Z885 Allergy status to narcotic agent status: Secondary | ICD-10-CM | POA: Diagnosis not present

## 2022-07-17 DIAGNOSIS — H60312 Diffuse otitis externa, left ear: Secondary | ICD-10-CM | POA: Diagnosis not present

## 2022-07-17 DIAGNOSIS — H6092 Unspecified otitis externa, left ear: Secondary | ICD-10-CM | POA: Diagnosis not present

## 2022-07-17 DIAGNOSIS — H02215 Cicatricial lagophthalmos left lower eyelid: Secondary | ICD-10-CM | POA: Diagnosis not present

## 2022-07-17 DIAGNOSIS — M8730 Other secondary osteonecrosis, unspecified bone: Secondary | ICD-10-CM | POA: Diagnosis not present

## 2022-07-17 DIAGNOSIS — F17210 Nicotine dependence, cigarettes, uncomplicated: Secondary | ICD-10-CM | POA: Diagnosis not present

## 2022-07-21 DIAGNOSIS — C3490 Malignant neoplasm of unspecified part of unspecified bronchus or lung: Secondary | ICD-10-CM | POA: Diagnosis not present

## 2022-07-21 DIAGNOSIS — C689 Malignant neoplasm of urinary organ, unspecified: Secondary | ICD-10-CM | POA: Diagnosis not present

## 2022-07-21 DIAGNOSIS — H60312 Diffuse otitis externa, left ear: Secondary | ICD-10-CM | POA: Diagnosis not present

## 2022-07-21 DIAGNOSIS — M8788 Other osteonecrosis, other site: Secondary | ICD-10-CM | POA: Diagnosis not present

## 2022-07-21 DIAGNOSIS — I214 Non-ST elevation (NSTEMI) myocardial infarction: Secondary | ICD-10-CM | POA: Diagnosis not present

## 2022-07-21 DIAGNOSIS — C07 Malignant neoplasm of parotid gland: Secondary | ICD-10-CM | POA: Diagnosis not present

## 2022-07-23 ENCOUNTER — Other Ambulatory Visit: Payer: Self-pay

## 2022-07-23 ENCOUNTER — Ambulatory Visit
Admission: RE | Admit: 2022-07-23 | Discharge: 2022-07-23 | Disposition: A | Discharge status: Home / Self Care | Payer: Medicare Other | Source: Ambulatory Visit | Attending: Radiation Oncology | Admitting: Radiation Oncology

## 2022-07-23 DIAGNOSIS — Z510 Encounter for antineoplastic radiation therapy: Secondary | ICD-10-CM | POA: Diagnosis not present

## 2022-07-23 DIAGNOSIS — C61 Malignant neoplasm of prostate: Secondary | ICD-10-CM | POA: Diagnosis not present

## 2022-07-23 LAB — RAD ONC ARIA SESSION SUMMARY
Course Elapsed Days: 0
Plan Fractions Treated to Date: 1
Plan Prescribed Dose Per Fraction: 2.5 Gy
Plan Total Fractions Prescribed: 28
Plan Total Prescribed Dose: 70 Gy
Reference Point Dosage Given to Date: 2.5 Gy
Reference Point Session Dosage Given: 2.5 Gy
Session Number: 1

## 2022-07-24 ENCOUNTER — Other Ambulatory Visit: Payer: Self-pay

## 2022-07-24 ENCOUNTER — Ambulatory Visit
Admission: RE | Admit: 2022-07-24 | Discharge: 2022-07-24 | Disposition: A | Discharge status: Home / Self Care | Payer: Medicare Other | Source: Ambulatory Visit | Attending: Radiation Oncology | Admitting: Radiation Oncology

## 2022-07-24 DIAGNOSIS — C3492 Malignant neoplasm of unspecified part of left bronchus or lung: Secondary | ICD-10-CM

## 2022-07-24 DIAGNOSIS — C61 Malignant neoplasm of prostate: Secondary | ICD-10-CM | POA: Diagnosis not present

## 2022-07-24 DIAGNOSIS — Z510 Encounter for antineoplastic radiation therapy: Secondary | ICD-10-CM | POA: Diagnosis not present

## 2022-07-24 LAB — RAD ONC ARIA SESSION SUMMARY
Course Elapsed Days: 1
Plan Fractions Treated to Date: 2
Plan Prescribed Dose Per Fraction: 2.5 Gy
Plan Total Fractions Prescribed: 28
Plan Total Prescribed Dose: 70 Gy
Reference Point Dosage Given to Date: 5 Gy
Reference Point Session Dosage Given: 2.5 Gy
Session Number: 2

## 2022-07-25 ENCOUNTER — Ambulatory Visit
Admission: RE | Admit: 2022-07-25 | Discharge: 2022-07-25 | Disposition: A | Discharge status: Home / Self Care | Payer: Medicare Other | Source: Ambulatory Visit | Attending: Radiation Oncology | Admitting: Radiation Oncology

## 2022-07-25 ENCOUNTER — Other Ambulatory Visit: Payer: Self-pay

## 2022-07-25 DIAGNOSIS — Z510 Encounter for antineoplastic radiation therapy: Secondary | ICD-10-CM | POA: Diagnosis not present

## 2022-07-25 DIAGNOSIS — C61 Malignant neoplasm of prostate: Secondary | ICD-10-CM | POA: Diagnosis not present

## 2022-07-25 LAB — RAD ONC ARIA SESSION SUMMARY
Course Elapsed Days: 2
Plan Fractions Treated to Date: 3
Plan Prescribed Dose Per Fraction: 2.5 Gy
Plan Total Fractions Prescribed: 28
Plan Total Prescribed Dose: 70 Gy
Reference Point Dosage Given to Date: 7.5 Gy
Reference Point Session Dosage Given: 2.5 Gy
Session Number: 3

## 2022-07-27 ENCOUNTER — Other Ambulatory Visit: Payer: Self-pay

## 2022-07-27 ENCOUNTER — Ambulatory Visit
Admission: RE | Admit: 2022-07-27 | Discharge: 2022-07-27 | Disposition: A | Discharge status: Home / Self Care | Payer: Medicare Other | Source: Ambulatory Visit | Attending: Radiation Oncology | Admitting: Radiation Oncology

## 2022-07-27 DIAGNOSIS — C61 Malignant neoplasm of prostate: Secondary | ICD-10-CM | POA: Diagnosis not present

## 2022-07-27 DIAGNOSIS — Z510 Encounter for antineoplastic radiation therapy: Secondary | ICD-10-CM | POA: Diagnosis not present

## 2022-07-27 LAB — RAD ONC ARIA SESSION SUMMARY
Course Elapsed Days: 4
Plan Fractions Treated to Date: 4
Plan Prescribed Dose Per Fraction: 2.5 Gy
Plan Total Fractions Prescribed: 28
Plan Total Prescribed Dose: 70 Gy
Reference Point Dosage Given to Date: 10 Gy
Reference Point Session Dosage Given: 2.5 Gy
Session Number: 4

## 2022-07-28 ENCOUNTER — Other Ambulatory Visit: Payer: Self-pay

## 2022-07-28 ENCOUNTER — Ambulatory Visit
Admission: RE | Admit: 2022-07-28 | Discharge: 2022-07-28 | Disposition: A | Discharge status: Home / Self Care | Payer: Medicare Other | Source: Ambulatory Visit | Attending: Radiation Oncology | Admitting: Radiation Oncology

## 2022-07-28 DIAGNOSIS — C07 Malignant neoplasm of parotid gland: Secondary | ICD-10-CM | POA: Diagnosis not present

## 2022-07-28 DIAGNOSIS — C689 Malignant neoplasm of urinary organ, unspecified: Secondary | ICD-10-CM | POA: Diagnosis not present

## 2022-07-28 DIAGNOSIS — C3490 Malignant neoplasm of unspecified part of unspecified bronchus or lung: Secondary | ICD-10-CM | POA: Diagnosis not present

## 2022-07-28 DIAGNOSIS — M8788 Other osteonecrosis, other site: Secondary | ICD-10-CM | POA: Diagnosis not present

## 2022-07-28 DIAGNOSIS — C61 Malignant neoplasm of prostate: Secondary | ICD-10-CM | POA: Diagnosis not present

## 2022-07-28 DIAGNOSIS — H60312 Diffuse otitis externa, left ear: Secondary | ICD-10-CM | POA: Diagnosis not present

## 2022-07-28 DIAGNOSIS — I214 Non-ST elevation (NSTEMI) myocardial infarction: Secondary | ICD-10-CM | POA: Diagnosis not present

## 2022-07-28 DIAGNOSIS — Z510 Encounter for antineoplastic radiation therapy: Secondary | ICD-10-CM | POA: Diagnosis not present

## 2022-07-28 LAB — RAD ONC ARIA SESSION SUMMARY
Course Elapsed Days: 5
Plan Fractions Treated to Date: 5
Plan Prescribed Dose Per Fraction: 2.5 Gy
Plan Total Fractions Prescribed: 28
Plan Total Prescribed Dose: 70 Gy
Reference Point Dosage Given to Date: 12.5 Gy
Reference Point Session Dosage Given: 2.5 Gy
Session Number: 5

## 2022-07-28 MED ORDER — FENTANYL 50 MCG/HR TD PT72: 1.0000 | MEDICATED_PATCH | TRANSDERMAL | 0 refills | Status: DC

## 2022-07-28 MED ORDER — MORPHINE SULFATE 15 MG PO TABS: 15.0000 mg | ORAL_TABLET | ORAL | 0 refills | Status: DC | PRN

## 2022-07-29 ENCOUNTER — Other Ambulatory Visit: Payer: Self-pay

## 2022-07-29 ENCOUNTER — Ambulatory Visit
Admission: RE | Admit: 2022-07-29 | Discharge: 2022-07-29 | Disposition: A | Discharge status: Home / Self Care | Payer: Medicare Other | Source: Ambulatory Visit | Attending: Radiation Oncology | Admitting: Radiation Oncology

## 2022-07-29 ENCOUNTER — Ambulatory Visit: Payer: Medicare Other

## 2022-07-29 DIAGNOSIS — C61 Malignant neoplasm of prostate: Secondary | ICD-10-CM | POA: Diagnosis not present

## 2022-07-29 DIAGNOSIS — Z510 Encounter for antineoplastic radiation therapy: Secondary | ICD-10-CM | POA: Diagnosis not present

## 2022-07-29 LAB — RAD ONC ARIA SESSION SUMMARY
Course Elapsed Days: 6
Plan Fractions Treated to Date: 6
Plan Prescribed Dose Per Fraction: 2.5 Gy
Plan Total Fractions Prescribed: 28
Plan Total Prescribed Dose: 70 Gy
Reference Point Dosage Given to Date: 15 Gy
Reference Point Session Dosage Given: 2.5 Gy
Session Number: 6

## 2022-07-30 ENCOUNTER — Ambulatory Visit
Admission: RE | Admit: 2022-07-30 | Discharge: 2022-07-30 | Disposition: A | Discharge status: Home / Self Care | Payer: Medicare Other | Source: Ambulatory Visit | Attending: Radiation Oncology | Admitting: Radiation Oncology

## 2022-07-30 ENCOUNTER — Ambulatory Visit: Payer: Medicare Other

## 2022-07-30 ENCOUNTER — Other Ambulatory Visit: Payer: Self-pay

## 2022-07-30 DIAGNOSIS — C3490 Malignant neoplasm of unspecified part of unspecified bronchus or lung: Secondary | ICD-10-CM | POA: Diagnosis not present

## 2022-07-30 DIAGNOSIS — C61 Malignant neoplasm of prostate: Secondary | ICD-10-CM | POA: Diagnosis not present

## 2022-07-30 DIAGNOSIS — C07 Malignant neoplasm of parotid gland: Secondary | ICD-10-CM | POA: Diagnosis not present

## 2022-07-30 DIAGNOSIS — Z510 Encounter for antineoplastic radiation therapy: Secondary | ICD-10-CM | POA: Diagnosis not present

## 2022-07-30 DIAGNOSIS — M8788 Other osteonecrosis, other site: Secondary | ICD-10-CM | POA: Diagnosis not present

## 2022-07-30 DIAGNOSIS — C689 Malignant neoplasm of urinary organ, unspecified: Secondary | ICD-10-CM | POA: Diagnosis not present

## 2022-07-30 DIAGNOSIS — H60312 Diffuse otitis externa, left ear: Secondary | ICD-10-CM | POA: Diagnosis not present

## 2022-07-30 DIAGNOSIS — I214 Non-ST elevation (NSTEMI) myocardial infarction: Secondary | ICD-10-CM | POA: Diagnosis not present

## 2022-07-30 LAB — RAD ONC ARIA SESSION SUMMARY
Course Elapsed Days: 7
Plan Fractions Treated to Date: 7
Plan Prescribed Dose Per Fraction: 2.5 Gy
Plan Total Fractions Prescribed: 28
Plan Total Prescribed Dose: 70 Gy
Reference Point Dosage Given to Date: 17.5 Gy
Reference Point Session Dosage Given: 2.5 Gy
Session Number: 7

## 2022-08-04 ENCOUNTER — Ambulatory Visit
Admission: RE | Admit: 2022-08-04 | Discharge: 2022-08-04 | Disposition: A | Discharge status: Home / Self Care | Payer: Medicare Other | Source: Ambulatory Visit | Attending: Radiation Oncology | Admitting: Radiation Oncology

## 2022-08-04 ENCOUNTER — Other Ambulatory Visit: Payer: Self-pay

## 2022-08-04 DIAGNOSIS — Z510 Encounter for antineoplastic radiation therapy: Secondary | ICD-10-CM | POA: Diagnosis not present

## 2022-08-04 DIAGNOSIS — C61 Malignant neoplasm of prostate: Secondary | ICD-10-CM | POA: Diagnosis not present

## 2022-08-04 LAB — RAD ONC ARIA SESSION SUMMARY
Course Elapsed Days: 12
Plan Fractions Treated to Date: 8
Plan Prescribed Dose Per Fraction: 2.5 Gy
Plan Total Fractions Prescribed: 28
Plan Total Prescribed Dose: 70 Gy
Reference Point Dosage Given to Date: 20 Gy
Reference Point Session Dosage Given: 2.5 Gy
Session Number: 8

## 2022-08-05 ENCOUNTER — Other Ambulatory Visit: Payer: Self-pay

## 2022-08-05 ENCOUNTER — Ambulatory Visit
Admission: RE | Admit: 2022-08-05 | Discharge: 2022-08-05 | Disposition: A | Discharge status: Home / Self Care | Payer: Medicare Other | Source: Ambulatory Visit | Attending: Radiation Oncology | Admitting: Radiation Oncology

## 2022-08-05 DIAGNOSIS — C61 Malignant neoplasm of prostate: Secondary | ICD-10-CM | POA: Diagnosis not present

## 2022-08-05 DIAGNOSIS — Z510 Encounter for antineoplastic radiation therapy: Secondary | ICD-10-CM | POA: Diagnosis not present

## 2022-08-05 LAB — RAD ONC ARIA SESSION SUMMARY
Course Elapsed Days: 13
Plan Fractions Treated to Date: 9
Plan Prescribed Dose Per Fraction: 2.5 Gy
Plan Total Fractions Prescribed: 28
Plan Total Prescribed Dose: 70 Gy
Reference Point Dosage Given to Date: 22.5 Gy
Reference Point Session Dosage Given: 2.5 Gy
Session Number: 9

## 2022-08-06 ENCOUNTER — Other Ambulatory Visit: Payer: Self-pay

## 2022-08-06 ENCOUNTER — Inpatient Hospital Stay: Payer: No Typology Code available for payment source

## 2022-08-06 ENCOUNTER — Ambulatory Visit
Admission: RE | Admit: 2022-08-06 | Discharge: 2022-08-06 | Disposition: A | Discharge status: Home / Self Care | Payer: Medicare Other | Source: Ambulatory Visit | Attending: Radiation Oncology | Admitting: Radiation Oncology

## 2022-08-06 ENCOUNTER — Inpatient Hospital Stay (HOSPITAL_BASED_OUTPATIENT_CLINIC_OR_DEPARTMENT_OTHER): Payer: No Typology Code available for payment source | Admitting: Hematology

## 2022-08-06 VITALS — BP 122/59 | HR 59 | Temp 97.4°F | Resp 17 | Ht 70.0 in | Wt 124.6 lb

## 2022-08-06 DIAGNOSIS — Z7189 Other specified counseling: Secondary | ICD-10-CM | POA: Diagnosis not present

## 2022-08-06 DIAGNOSIS — C61 Malignant neoplasm of prostate: Secondary | ICD-10-CM | POA: Diagnosis not present

## 2022-08-06 DIAGNOSIS — C7951 Secondary malignant neoplasm of bone: Secondary | ICD-10-CM

## 2022-08-06 DIAGNOSIS — C3492 Malignant neoplasm of unspecified part of left bronchus or lung: Secondary | ICD-10-CM

## 2022-08-06 DIAGNOSIS — Z510 Encounter for antineoplastic radiation therapy: Secondary | ICD-10-CM | POA: Diagnosis not present

## 2022-08-06 DIAGNOSIS — Z5112 Encounter for antineoplastic immunotherapy: Secondary | ICD-10-CM | POA: Diagnosis not present

## 2022-08-06 DIAGNOSIS — C3412 Malignant neoplasm of upper lobe, left bronchus or lung: Secondary | ICD-10-CM

## 2022-08-06 DIAGNOSIS — Z95828 Presence of other vascular implants and grafts: Secondary | ICD-10-CM

## 2022-08-06 LAB — CBC WITH DIFFERENTIAL (CANCER CENTER ONLY)
Abs Immature Granulocytes: 0.02 10*3/uL (ref 0.00–0.07)
Basophils Absolute: 0 10*3/uL (ref 0.0–0.1)
Basophils Relative: 0 %
Eosinophils Absolute: 0.3 10*3/uL (ref 0.0–0.5)
Eosinophils Relative: 4 %
HCT: 37 % — ABNORMAL LOW (ref 39.0–52.0)
Hemoglobin: 12.8 g/dL — ABNORMAL LOW (ref 13.0–17.0)
Immature Granulocytes: 0 %
Lymphocytes Relative: 4 %
Lymphs Abs: 0.4 10*3/uL — ABNORMAL LOW (ref 0.7–4.0)
MCH: 32.8 pg (ref 26.0–34.0)
MCHC: 34.6 g/dL (ref 30.0–36.0)
MCV: 94.9 fL (ref 80.0–100.0)
Monocytes Absolute: 0.7 10*3/uL (ref 0.1–1.0)
Monocytes Relative: 9 %
Neutro Abs: 6.8 10*3/uL (ref 1.7–7.7)
Neutrophils Relative %: 83 %
Platelet Count: 168 10*3/uL (ref 150–400)
RBC: 3.9 MIL/uL — ABNORMAL LOW (ref 4.22–5.81)
RDW: 14.3 % (ref 11.5–15.5)
WBC Count: 8.2 10*3/uL (ref 4.0–10.5)
nRBC: 0 % (ref 0.0–0.2)

## 2022-08-06 LAB — CMP (CANCER CENTER ONLY)
ALT: 13 U/L (ref 0–44)
AST: 21 U/L (ref 15–41)
Albumin: 4 g/dL (ref 3.5–5.0)
Alkaline Phosphatase: 68 U/L (ref 38–126)
Anion gap: 4 — ABNORMAL LOW (ref 5–15)
BUN: 21 mg/dL (ref 8–23)
CO2: 30 mmol/L (ref 22–32)
Calcium: 9.9 mg/dL (ref 8.9–10.3)
Chloride: 102 mmol/L (ref 98–111)
Creatinine: 0.86 mg/dL (ref 0.61–1.24)
GFR, Estimated: >60 mL/min (ref >60)
Glucose, Bld: 114 mg/dL — ABNORMAL HIGH (ref 70–99)
Potassium: 4 mmol/L (ref 3.5–5.1)
Sodium: 136 mmol/L (ref 135–145)
Total Bilirubin: 0.5 mg/dL (ref 0.3–1.2)
Total Protein: 7.1 g/dL (ref 6.5–8.1)

## 2022-08-06 LAB — RAD ONC ARIA SESSION SUMMARY
Course Elapsed Days: 14
Plan Fractions Treated to Date: 10
Plan Prescribed Dose Per Fraction: 2.5 Gy
Plan Total Fractions Prescribed: 28
Plan Total Prescribed Dose: 70 Gy
Reference Point Dosage Given to Date: 25 Gy
Reference Point Session Dosage Given: 2.5 Gy
Session Number: 10

## 2022-08-06 MED ORDER — FAMOTIDINE 20 MG PO TABS
20.0000 mg | ORAL_TABLET | Freq: Once | ORAL | Status: AC
  Administered 2022-08-06: 20 mg via ORAL
  Filled 2022-08-06: qty 1

## 2022-08-06 MED ORDER — SODIUM CHLORIDE 0.9% FLUSH
10.0000 mL | INTRAVENOUS | Status: DC | PRN
  Administered 2022-08-06: 10 mL

## 2022-08-06 MED ORDER — DIPHENHYDRAMINE HCL 25 MG PO CAPS
25.0000 mg | ORAL_CAPSULE | Freq: Once | ORAL | Status: AC
  Administered 2022-08-06: 25 mg via ORAL
  Filled 2022-08-06: qty 1

## 2022-08-06 MED ORDER — SODIUM CHLORIDE 0.9% FLUSH
10.0000 mL | Freq: Once | INTRAVENOUS | Status: AC
  Administered 2022-08-06: 10 mL

## 2022-08-06 MED ORDER — SODIUM CHLORIDE 0.9 % IV SOLN: Freq: Once | INTRAVENOUS | Status: AC

## 2022-08-06 MED ORDER — HEPARIN SOD (PORK) LOCK FLUSH 100 UNIT/ML IV SOLN
500.0000 [IU] | Freq: Once | INTRAVENOUS | Status: AC | PRN
  Administered 2022-08-06: 500 [IU]

## 2022-08-06 MED ORDER — SODIUM CHLORIDE 0.9 % IV SOLN
200.0000 mg | Freq: Once | INTRAVENOUS | Status: AC
  Administered 2022-08-06: 200 mg via INTRAVENOUS
  Filled 2022-08-06: qty 200
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
## 2022-08-06 NOTE — Patient Instructions (Signed)
[facility] ONCOLOG[facility]  Discharge Instructions: Thank you for choosing [facility] to provide your oncology and hematology care.   If you have a lab appointment with the [facility], please go directly to the [facility] and check in at the registration area.   Wear comfortable clothing and clothing appropriate for easy access to any Portacath or PICC line.   We strive to give you quality time with your provider. You may need to reschedule your appointment if you arrive late (15 or more minutes).  Arriving late affects you and other patients whose appointments are after yours.  Also, if you miss three or more appointments without notifying the office, you may be dismissed from the clinic at the provider's discretion.      For prescription refill requests, have your pharmacy contact our office and allow 72 hours for refills to be completed.    Today you received the following chemotherapy and/or immunotherapy agents: pembrolizumab      To help prevent nausea and vomiting after your treatment, we encourage you to take your nausea medication as directed.  BELOW ARE SYMPTOMS THAT SHOULD BE REPORTED IMMEDIATELY: *FEVER GREATER THAN 100.4 F (38 C) OR HIGHER *CHILLS OR SWEATING *NAUSEA AND VOMITING THAT IS NOT CONTROLLED WITH YOUR NAUSEA MEDICATION *UNUSUAL SHORTNESS OF BREATH *UNUSUAL BRUISING OR BLEEDING *URINARY PROBLEMS (pain or burning when urinating, or frequent urination) *BOWEL PROBLEMS (unusual diarrhea, constipation, pain near the anus) TENDERNESS IN MOUTH AND THROAT WITH OR WITHOUT PRESENCE OF ULCERS (sore throat, sores in mouth, or a toothache) UNUSUAL RASH, SWELLING OR PAIN  UNUSUAL VAGINAL DISCHARGE OR ITCHING   Items with * indicate a potential emergency and should be followed up as soon as possible or go to the Emergency Department if any problems should occur.  Please show the CHEMOTHERAPY ALERT CARD or IMMUNOTHERAPY ALERT CARD at check-in  to the Emergency Department and triage nurse.  Should you have questions after your visit or need to cancel or reschedule your appointment, please contact [facility]  Dept: [phone]  and follow the prompts.  Office hours are 8:00 a.m. to 4:30 p.m. Monday - Friday. Please note that voicemails left after 4:00 p.m. may not be returned until the following business day.  We are closed weekends and major holidays. You have access to a nurse at all times for urgent questions. Please call the main number to the clinic Dept: [phone] and follow the prompts.   For any non-urgent questions, you may also contact your provider using MyChart. We now offer e-Visits for anyone 19 and older to request care online for non-urgent symptoms. For details visit [email or web address].   Also download the MyChart app! Go to the app store, search "MyChart", open the app, select [facility], and log in with your MyChart username and password.  Masks are optional in the cancer centers. If you would like for your care team to wear a mask while they are taking care of you, please let them know. You may have one support Ahaana Rochette who is at least 75 years old accompany you for your appointments.

## 2022-08-06 NOTE — Progress Notes (Signed)
Patient seen by MD today  Vitals are within treatment parameters.  Labs reviewed: and are within treatment parameters."  Per physician team, patient is ready for treatment and there are NO modifications to the treatment plan.

## 2022-08-07 ENCOUNTER — Ambulatory Visit
Admission: RE | Admit: 2022-08-07 | Discharge: 2022-08-07 | Disposition: A | Discharge status: Home / Self Care | Payer: Medicare Other | Source: Ambulatory Visit | Attending: Radiation Oncology | Admitting: Radiation Oncology

## 2022-08-07 ENCOUNTER — Other Ambulatory Visit: Payer: Self-pay

## 2022-08-07 DIAGNOSIS — Z510 Encounter for antineoplastic radiation therapy: Secondary | ICD-10-CM | POA: Diagnosis not present

## 2022-08-07 DIAGNOSIS — C61 Malignant neoplasm of prostate: Secondary | ICD-10-CM | POA: Diagnosis not present

## 2022-08-07 LAB — RAD ONC ARIA SESSION SUMMARY
Course Elapsed Days: 15
Plan Fractions Treated to Date: 11
Plan Prescribed Dose Per Fraction: 2.5 Gy
Plan Total Fractions Prescribed: 28
Plan Total Prescribed Dose: 70 Gy
Reference Point Dosage Given to Date: 27.5 Gy
Reference Point Session Dosage Given: 2.5 Gy
Session Number: 11

## 2022-08-08 ENCOUNTER — Ambulatory Visit
Admission: RE | Admit: 2022-08-08 | Discharge: 2022-08-08 | Disposition: A | Discharge status: Home / Self Care | Payer: Medicare Other | Source: Ambulatory Visit | Attending: Radiation Oncology | Admitting: Radiation Oncology

## 2022-08-08 ENCOUNTER — Other Ambulatory Visit: Payer: Self-pay | Admitting: Radiation Oncology

## 2022-08-08 ENCOUNTER — Other Ambulatory Visit: Payer: Self-pay

## 2022-08-08 DIAGNOSIS — C61 Malignant neoplasm of prostate: Secondary | ICD-10-CM | POA: Diagnosis not present

## 2022-08-08 DIAGNOSIS — Z510 Encounter for antineoplastic radiation therapy: Secondary | ICD-10-CM | POA: Insufficient documentation

## 2022-08-08 LAB — RAD ONC ARIA SESSION SUMMARY
Course Elapsed Days: 16
Plan Fractions Treated to Date: 12
Plan Prescribed Dose Per Fraction: 2.5 Gy
Plan Total Fractions Prescribed: 28
Plan Total Prescribed Dose: 70 Gy
Reference Point Dosage Given to Date: 30 Gy
Reference Point Session Dosage Given: 2.5 Gy
Session Number: 12

## 2022-08-08 MED ORDER — TAMSULOSIN HCL 0.4 MG PO CAPS: 0.4000 mg | ORAL_CAPSULE | Freq: Every day | ORAL | 5 refills | Status: AC

## 2022-08-11 ENCOUNTER — Other Ambulatory Visit: Payer: Self-pay

## 2022-08-11 ENCOUNTER — Ambulatory Visit
Admission: RE | Admit: 2022-08-11 | Discharge: 2022-08-11 | Disposition: A | Discharge status: Home / Self Care | Payer: Medicare Other | Source: Ambulatory Visit | Attending: Radiation Oncology | Admitting: Radiation Oncology

## 2022-08-11 DIAGNOSIS — Z510 Encounter for antineoplastic radiation therapy: Secondary | ICD-10-CM | POA: Diagnosis not present

## 2022-08-11 DIAGNOSIS — C61 Malignant neoplasm of prostate: Secondary | ICD-10-CM | POA: Diagnosis not present

## 2022-08-11 LAB — RAD ONC ARIA SESSION SUMMARY
Course Elapsed Days: 19
Plan Fractions Treated to Date: 13
Plan Prescribed Dose Per Fraction: 2.5 Gy
Plan Total Fractions Prescribed: 28
Plan Total Prescribed Dose: 70 Gy
Reference Point Dosage Given to Date: 32.5 Gy
Reference Point Session Dosage Given: 2.5 Gy
Session Number: 13

## 2022-08-12 ENCOUNTER — Ambulatory Visit
Admission: RE | Admit: 2022-08-12 | Discharge: 2022-08-12 | Disposition: A | Discharge status: Home / Self Care | Payer: Medicare Other | Source: Ambulatory Visit | Attending: Radiation Oncology | Admitting: Radiation Oncology

## 2022-08-12 ENCOUNTER — Other Ambulatory Visit: Payer: Self-pay

## 2022-08-12 ENCOUNTER — Encounter: Payer: Self-pay | Admitting: Hematology

## 2022-08-12 DIAGNOSIS — C61 Malignant neoplasm of prostate: Secondary | ICD-10-CM | POA: Diagnosis not present

## 2022-08-12 DIAGNOSIS — Z510 Encounter for antineoplastic radiation therapy: Secondary | ICD-10-CM | POA: Diagnosis not present

## 2022-08-12 LAB — RAD ONC ARIA SESSION SUMMARY
Course Elapsed Days: 20
Plan Fractions Treated to Date: 14
Plan Prescribed Dose Per Fraction: 2.5 Gy
Plan Total Fractions Prescribed: 28
Plan Total Prescribed Dose: 70 Gy
Reference Point Dosage Given to Date: 35 Gy
Reference Point Session Dosage Given: 2.5 Gy
Session Number: 14

## 2022-08-13 ENCOUNTER — Other Ambulatory Visit: Payer: Self-pay

## 2022-08-13 ENCOUNTER — Ambulatory Visit
Admission: RE | Admit: 2022-08-13 | Discharge: 2022-08-13 | Disposition: A | Discharge status: Home / Self Care | Payer: Medicare Other | Source: Ambulatory Visit | Attending: Radiation Oncology | Admitting: Radiation Oncology

## 2022-08-13 DIAGNOSIS — Z510 Encounter for antineoplastic radiation therapy: Secondary | ICD-10-CM | POA: Diagnosis not present

## 2022-08-13 DIAGNOSIS — C61 Malignant neoplasm of prostate: Secondary | ICD-10-CM | POA: Diagnosis not present

## 2022-08-13 LAB — RAD ONC ARIA SESSION SUMMARY
Course Elapsed Days: 21
Plan Fractions Treated to Date: 15
Plan Prescribed Dose Per Fraction: 2.5 Gy
Plan Total Fractions Prescribed: 28
Plan Total Prescribed Dose: 70 Gy
Reference Point Dosage Given to Date: 37.5 Gy
Reference Point Session Dosage Given: 2.5 Gy
Session Number: 15

## 2022-08-14 ENCOUNTER — Ambulatory Visit
Admission: RE | Admit: 2022-08-14 | Discharge: 2022-08-14 | Disposition: A | Discharge status: Home / Self Care | Payer: Medicare Other | Source: Ambulatory Visit | Attending: Radiation Oncology | Admitting: Radiation Oncology

## 2022-08-14 ENCOUNTER — Other Ambulatory Visit: Payer: Self-pay

## 2022-08-14 DIAGNOSIS — Z510 Encounter for antineoplastic radiation therapy: Secondary | ICD-10-CM | POA: Diagnosis not present

## 2022-08-14 DIAGNOSIS — C61 Malignant neoplasm of prostate: Secondary | ICD-10-CM | POA: Diagnosis not present

## 2022-08-14 LAB — RAD ONC ARIA SESSION SUMMARY
Course Elapsed Days: 22
Plan Fractions Treated to Date: 16
Plan Prescribed Dose Per Fraction: 2.5 Gy
Plan Total Fractions Prescribed: 28
Plan Total Prescribed Dose: 70 Gy
Reference Point Dosage Given to Date: 40 Gy
Reference Point Session Dosage Given: 2.5 Gy
Session Number: 16

## 2022-08-15 ENCOUNTER — Ambulatory Visit
Admission: RE | Admit: 2022-08-15 | Discharge: 2022-08-15 | Disposition: A | Discharge status: Home / Self Care | Payer: Medicare Other | Source: Ambulatory Visit | Attending: Radiation Oncology | Admitting: Radiation Oncology

## 2022-08-15 ENCOUNTER — Other Ambulatory Visit: Payer: Self-pay

## 2022-08-15 DIAGNOSIS — C61 Malignant neoplasm of prostate: Secondary | ICD-10-CM | POA: Diagnosis not present

## 2022-08-15 DIAGNOSIS — Z510 Encounter for antineoplastic radiation therapy: Secondary | ICD-10-CM | POA: Diagnosis not present

## 2022-08-15 LAB — RAD ONC ARIA SESSION SUMMARY
Course Elapsed Days: 23
Plan Fractions Treated to Date: 17
Plan Prescribed Dose Per Fraction: 2.5 Gy
Plan Total Fractions Prescribed: 28
Plan Total Prescribed Dose: 70 Gy
Reference Point Dosage Given to Date: 42.5 Gy
Reference Point Session Dosage Given: 2.5 Gy
Session Number: 17

## 2022-08-18 ENCOUNTER — Ambulatory Visit
Admission: RE | Admit: 2022-08-18 | Discharge: 2022-08-18 | Disposition: A | Discharge status: Home / Self Care | Payer: Medicare Other | Source: Ambulatory Visit | Attending: Radiation Oncology | Admitting: Radiation Oncology

## 2022-08-18 ENCOUNTER — Other Ambulatory Visit: Payer: Self-pay

## 2022-08-18 DIAGNOSIS — C61 Malignant neoplasm of prostate: Secondary | ICD-10-CM | POA: Diagnosis not present

## 2022-08-18 DIAGNOSIS — Z510 Encounter for antineoplastic radiation therapy: Secondary | ICD-10-CM | POA: Diagnosis not present

## 2022-08-18 LAB — RAD ONC ARIA SESSION SUMMARY
Course Elapsed Days: 26
Plan Fractions Treated to Date: 18
Plan Prescribed Dose Per Fraction: 2.5 Gy
Plan Total Fractions Prescribed: 28
Plan Total Prescribed Dose: 70 Gy
Reference Point Dosage Given to Date: 45 Gy
Reference Point Session Dosage Given: 2.5 Gy
Session Number: 18

## 2022-08-19 ENCOUNTER — Other Ambulatory Visit: Payer: Self-pay

## 2022-08-19 ENCOUNTER — Ambulatory Visit
Admission: RE | Admit: 2022-08-19 | Discharge: 2022-08-19 | Disposition: A | Discharge status: Home / Self Care | Payer: Medicare Other | Source: Ambulatory Visit | Attending: Radiation Oncology | Admitting: Radiation Oncology

## 2022-08-19 DIAGNOSIS — Z510 Encounter for antineoplastic radiation therapy: Secondary | ICD-10-CM | POA: Diagnosis not present

## 2022-08-19 DIAGNOSIS — C61 Malignant neoplasm of prostate: Secondary | ICD-10-CM | POA: Diagnosis not present

## 2022-08-19 LAB — RAD ONC ARIA SESSION SUMMARY
Course Elapsed Days: 27
Plan Fractions Treated to Date: 19
Plan Prescribed Dose Per Fraction: 2.5 Gy
Plan Total Fractions Prescribed: 28
Plan Total Prescribed Dose: 70 Gy
Reference Point Dosage Given to Date: 47.5 Gy
Reference Point Session Dosage Given: 2.5 Gy
Session Number: 19

## 2022-08-20 ENCOUNTER — Other Ambulatory Visit: Payer: Self-pay

## 2022-08-20 ENCOUNTER — Ambulatory Visit
Admission: RE | Admit: 2022-08-20 | Discharge: 2022-08-20 | Disposition: A | Discharge status: Home / Self Care | Payer: Medicare Other | Source: Ambulatory Visit | Attending: Radiation Oncology | Admitting: Radiation Oncology

## 2022-08-20 DIAGNOSIS — C61 Malignant neoplasm of prostate: Secondary | ICD-10-CM | POA: Diagnosis not present

## 2022-08-20 DIAGNOSIS — Z510 Encounter for antineoplastic radiation therapy: Secondary | ICD-10-CM | POA: Diagnosis not present

## 2022-08-20 LAB — RAD ONC ARIA SESSION SUMMARY
Course Elapsed Days: 28
Plan Fractions Treated to Date: 20
Plan Prescribed Dose Per Fraction: 2.5 Gy
Plan Total Fractions Prescribed: 28
Plan Total Prescribed Dose: 70 Gy
Reference Point Dosage Given to Date: 50 Gy
Reference Point Session Dosage Given: 2.5 Gy
Session Number: 20

## 2022-08-21 ENCOUNTER — Other Ambulatory Visit: Payer: Self-pay

## 2022-08-21 ENCOUNTER — Ambulatory Visit
Admission: RE | Admit: 2022-08-21 | Discharge: 2022-08-21 | Disposition: A | Discharge status: Home / Self Care | Payer: Medicare Other | Source: Ambulatory Visit | Attending: Radiation Oncology | Admitting: Radiation Oncology

## 2022-08-21 DIAGNOSIS — C61 Malignant neoplasm of prostate: Secondary | ICD-10-CM | POA: Diagnosis not present

## 2022-08-21 DIAGNOSIS — Z510 Encounter for antineoplastic radiation therapy: Secondary | ICD-10-CM | POA: Diagnosis not present

## 2022-08-21 LAB — RAD ONC ARIA SESSION SUMMARY
Course Elapsed Days: 29
Plan Fractions Treated to Date: 21
Plan Prescribed Dose Per Fraction: 2.5 Gy
Plan Total Fractions Prescribed: 28
Plan Total Prescribed Dose: 70 Gy
Reference Point Dosage Given to Date: 52.5 Gy
Reference Point Session Dosage Given: 2.5 Gy
Session Number: 21

## 2022-08-22 ENCOUNTER — Other Ambulatory Visit: Payer: Self-pay

## 2022-08-22 ENCOUNTER — Ambulatory Visit
Admission: RE | Admit: 2022-08-22 | Discharge: 2022-08-22 | Disposition: A | Discharge status: Home / Self Care | Payer: Medicare Other | Source: Ambulatory Visit | Attending: Radiation Oncology | Admitting: Radiation Oncology

## 2022-08-22 DIAGNOSIS — C61 Malignant neoplasm of prostate: Secondary | ICD-10-CM | POA: Diagnosis not present

## 2022-08-22 DIAGNOSIS — Z510 Encounter for antineoplastic radiation therapy: Secondary | ICD-10-CM | POA: Diagnosis not present

## 2022-08-22 LAB — RAD ONC ARIA SESSION SUMMARY
Course Elapsed Days: 30
Plan Fractions Treated to Date: 22
Plan Prescribed Dose Per Fraction: 2.5 Gy
Plan Total Fractions Prescribed: 28
Plan Total Prescribed Dose: 70 Gy
Reference Point Dosage Given to Date: 55 Gy
Reference Point Session Dosage Given: 2.5 Gy
Session Number: 22

## 2022-08-25 ENCOUNTER — Other Ambulatory Visit: Payer: Self-pay

## 2022-08-25 ENCOUNTER — Ambulatory Visit
Admission: RE | Admit: 2022-08-25 | Discharge: 2022-08-25 | Disposition: A | Discharge status: Home / Self Care | Payer: Medicare Other | Source: Ambulatory Visit | Attending: Radiation Oncology | Admitting: Radiation Oncology

## 2022-08-25 DIAGNOSIS — Z510 Encounter for antineoplastic radiation therapy: Secondary | ICD-10-CM | POA: Diagnosis not present

## 2022-08-25 DIAGNOSIS — C61 Malignant neoplasm of prostate: Secondary | ICD-10-CM | POA: Diagnosis not present

## 2022-08-25 LAB — RAD ONC ARIA SESSION SUMMARY
Course Elapsed Days: 33
Plan Fractions Treated to Date: 23
Plan Prescribed Dose Per Fraction: 2.5 Gy
Plan Total Fractions Prescribed: 28
Plan Total Prescribed Dose: 70 Gy
Reference Point Dosage Given to Date: 57.5 Gy
Reference Point Session Dosage Given: 2.5 Gy
Session Number: 23

## 2022-08-26 ENCOUNTER — Other Ambulatory Visit: Payer: Self-pay

## 2022-08-26 ENCOUNTER — Ambulatory Visit
Admission: RE | Admit: 2022-08-26 | Discharge: 2022-08-26 | Disposition: A | Discharge status: Home / Self Care | Payer: Medicare Other | Source: Ambulatory Visit | Attending: Radiation Oncology | Admitting: Radiation Oncology

## 2022-08-26 DIAGNOSIS — Z510 Encounter for antineoplastic radiation therapy: Secondary | ICD-10-CM | POA: Diagnosis not present

## 2022-08-26 DIAGNOSIS — C61 Malignant neoplasm of prostate: Secondary | ICD-10-CM | POA: Diagnosis not present

## 2022-08-26 LAB — RAD ONC ARIA SESSION SUMMARY
Course Elapsed Days: 34
Plan Fractions Treated to Date: 24
Plan Prescribed Dose Per Fraction: 2.5 Gy
Plan Total Fractions Prescribed: 28
Plan Total Prescribed Dose: 70 Gy
Reference Point Dosage Given to Date: 60 Gy
Reference Point Session Dosage Given: 2.5 Gy
Session Number: 24

## 2022-08-27 ENCOUNTER — Ambulatory Visit
Admission: RE | Admit: 2022-08-27 | Discharge: 2022-08-27 | Disposition: A | Discharge status: Home / Self Care | Payer: Medicare Other | Source: Ambulatory Visit | Attending: Radiation Oncology | Admitting: Radiation Oncology

## 2022-08-27 ENCOUNTER — Inpatient Hospital Stay: Payer: No Typology Code available for payment source

## 2022-08-27 ENCOUNTER — Other Ambulatory Visit: Payer: Self-pay

## 2022-08-27 ENCOUNTER — Inpatient Hospital Stay: Payer: No Typology Code available for payment source | Attending: Hematology

## 2022-08-27 VITALS — BP 110/57 | HR 67 | Temp 97.8°F | Resp 18 | Wt 123.5 lb

## 2022-08-27 DIAGNOSIS — C61 Malignant neoplasm of prostate: Secondary | ICD-10-CM | POA: Insufficient documentation

## 2022-08-27 DIAGNOSIS — Z7189 Other specified counseling: Secondary | ICD-10-CM

## 2022-08-27 DIAGNOSIS — F17210 Nicotine dependence, cigarettes, uncomplicated: Secondary | ICD-10-CM | POA: Insufficient documentation

## 2022-08-27 DIAGNOSIS — C3412 Malignant neoplasm of upper lobe, left bronchus or lung: Secondary | ICD-10-CM | POA: Diagnosis present

## 2022-08-27 DIAGNOSIS — Z5112 Encounter for antineoplastic immunotherapy: Secondary | ICD-10-CM | POA: Diagnosis present

## 2022-08-27 DIAGNOSIS — C7951 Secondary malignant neoplasm of bone: Secondary | ICD-10-CM

## 2022-08-27 DIAGNOSIS — Z510 Encounter for antineoplastic radiation therapy: Secondary | ICD-10-CM | POA: Diagnosis not present

## 2022-08-27 DIAGNOSIS — C3492 Malignant neoplasm of unspecified part of left bronchus or lung: Secondary | ICD-10-CM

## 2022-08-27 DIAGNOSIS — Z95828 Presence of other vascular implants and grafts: Secondary | ICD-10-CM

## 2022-08-27 LAB — CBC WITH DIFFERENTIAL (CANCER CENTER ONLY)
Abs Immature Granulocytes: 0.02 10*3/uL (ref 0.00–0.07)
Basophils Absolute: 0 10*3/uL (ref 0.0–0.1)
Basophils Relative: 0 %
Eosinophils Absolute: 0.2 10*3/uL (ref 0.0–0.5)
Eosinophils Relative: 4 %
HCT: 35.9 % — ABNORMAL LOW (ref 39.0–52.0)
Hemoglobin: 12.6 g/dL — ABNORMAL LOW (ref 13.0–17.0)
Immature Granulocytes: 0 %
Lymphocytes Relative: 5 %
Lymphs Abs: 0.3 10*3/uL — ABNORMAL LOW (ref 0.7–4.0)
MCH: 33.8 pg (ref 26.0–34.0)
MCHC: 35.1 g/dL (ref 30.0–36.0)
MCV: 96.2 fL (ref 80.0–100.0)
Monocytes Absolute: 0.6 10*3/uL (ref 0.1–1.0)
Monocytes Relative: 10 %
Neutro Abs: 5.1 10*3/uL (ref 1.7–7.7)
Neutrophils Relative %: 81 %
Platelet Count: 145 10*3/uL — ABNORMAL LOW (ref 150–400)
RBC: 3.73 MIL/uL — ABNORMAL LOW (ref 4.22–5.81)
RDW: 14.9 % (ref 11.5–15.5)
WBC Count: 6.3 10*3/uL (ref 4.0–10.5)
nRBC: 0 % (ref 0.0–0.2)

## 2022-08-27 LAB — RAD ONC ARIA SESSION SUMMARY
Course Elapsed Days: 35
Plan Fractions Treated to Date: 25
Plan Prescribed Dose Per Fraction: 2.5 Gy
Plan Total Fractions Prescribed: 28
Plan Total Prescribed Dose: 70 Gy
Reference Point Dosage Given to Date: 62.5 Gy
Reference Point Session Dosage Given: 2.5 Gy
Session Number: 25

## 2022-08-27 LAB — CMP (CANCER CENTER ONLY)
ALT: 15 U/L (ref 0–44)
AST: 21 U/L (ref 15–41)
Albumin: 3.5 g/dL (ref 3.5–5.0)
Alkaline Phosphatase: 63 U/L (ref 38–126)
Anion gap: 3 — ABNORMAL LOW (ref 5–15)
BUN: 16 mg/dL (ref 8–23)
CO2: 32 mmol/L (ref 22–32)
Calcium: 9.5 mg/dL (ref 8.9–10.3)
Chloride: 104 mmol/L (ref 98–111)
Creatinine: 0.91 mg/dL (ref 0.61–1.24)
GFR, Estimated: >60 mL/min (ref >60)
Glucose, Bld: 110 mg/dL — ABNORMAL HIGH (ref 70–99)
Potassium: 4.1 mmol/L (ref 3.5–5.1)
Sodium: 139 mmol/L (ref 135–145)
Total Bilirubin: 0.4 mg/dL (ref 0.3–1.2)
Total Protein: 6.2 g/dL — ABNORMAL LOW (ref 6.5–8.1)

## 2022-08-27 LAB — TSH: TSH: 2.364 u[IU]/mL (ref 0.350–4.500)

## 2022-08-27 MED ORDER — DIPHENHYDRAMINE HCL 25 MG PO CAPS
25.0000 mg | ORAL_CAPSULE | Freq: Once | ORAL | Status: AC
  Administered 2022-08-27: 25 mg via ORAL
  Filled 2022-08-27: qty 1

## 2022-08-27 MED ORDER — SODIUM CHLORIDE 0.9% FLUSH
10.0000 mL | INTRAVENOUS | Status: DC | PRN
  Administered 2022-08-27: 10 mL

## 2022-08-27 MED ORDER — HEPARIN SOD (PORK) LOCK FLUSH 100 UNIT/ML IV SOLN
500.0000 [IU] | Freq: Once | INTRAVENOUS | Status: AC | PRN
  Administered 2022-08-27: 500 [IU]

## 2022-08-27 MED ORDER — SODIUM CHLORIDE 0.9 % IV SOLN: Freq: Once | INTRAVENOUS | Status: AC

## 2022-08-27 MED ORDER — SODIUM CHLORIDE 0.9 % IV SOLN
200.0000 mg | Freq: Once | INTRAVENOUS | Status: AC
  Administered 2022-08-27: 200 mg via INTRAVENOUS
  Filled 2022-08-27: qty 200

## 2022-08-27 MED ORDER — FAMOTIDINE 20 MG PO TABS
20.0000 mg | ORAL_TABLET | Freq: Once | ORAL | Status: AC
  Administered 2022-08-27: 20 mg via ORAL
  Filled 2022-08-27: qty 1

## 2022-08-27 NOTE — Patient Instructions (Signed)

## 2022-08-27 NOTE — Patient Instructions (Signed)
[facility]  Discharge Instructions: Thank you for choosing [facility] to provide your oncology and hematology care.   If you have a lab appointment with the [facility], please go directly to the [facility] and check in at the registration area.   Wear comfortable clothing and clothing appropriate for easy access to any Portacath or PICC line.   We strive to give you quality time with your provider. You may need to reschedule your appointment if you arrive late (15 or more minutes).  Arriving late affects you and other patients whose appointments are after yours.  Also, if you miss three or more appointments without notifying the office, you may be dismissed from the clinic at the provider's discretion.      For prescription refill requests, have your pharmacy contact our office and allow 72 hours for refills to be completed.    Today you received the following chemotherapy and/or immunotherapy agents: pembrolizumab      To help prevent nausea and vomiting after your treatment, we encourage you to take your nausea medication as directed.  BELOW ARE SYMPTOMS THAT SHOULD BE REPORTED IMMEDIATELY: *FEVER GREATER THAN 100.4 F (38 C) OR HIGHER *CHILLS OR SWEATING *NAUSEA AND VOMITING THAT IS NOT CONTROLLED WITH YOUR NAUSEA MEDICATION *UNUSUAL SHORTNESS OF BREATH *UNUSUAL BRUISING OR BLEEDING *URINARY PROBLEMS (pain or burning when urinating, or frequent urination) *BOWEL PROBLEMS (unusual diarrhea, constipation, pain near the anus) TENDERNESS IN MOUTH AND THROAT WITH OR WITHOUT PRESENCE OF ULCERS (sore throat, sores in mouth, or a toothache) UNUSUAL RASH, SWELLING OR PAIN  UNUSUAL VAGINAL DISCHARGE OR ITCHING   Items with * indicate a potential emergency and should be followed up as soon as possible or go to the Emergency Department if any problems should occur.  Please show the CHEMOTHERAPY ALERT CARD or IMMUNOTHERAPY ALERT CARD at check-in  to the Emergency Department and triage nurse.  Should you have questions after your visit or need to cancel or reschedule your appointment, please contact [facility]  Dept: [phone]  and follow the prompts.  Office hours are 8:00 a.m. to 4:30 p.m. Monday - Friday. Please note that voicemails left after 4:00 p.m. may not be returned until the following business day.  We are closed weekends and major holidays. You have access to a nurse at all times for urgent questions. Please call the main number to the clinic Dept: [phone] and follow the prompts.   For any non-urgent questions, you may also contact your provider using MyChart. We now offer e-Visits for anyone 71 and older to request care online for non-urgent symptoms. For details visit [email or web address].   Also download the MyChart app! Go to the app store, search "MyChart", open the app, select [facility], and log in with your MyChart username and password.  Masks are optional in the cancer centers. If you would like for your care team to wear a mask while they are taking care of you, please let them know. You may have one support person who is at least 75 years old accompany you for your appointments.

## 2022-08-28 ENCOUNTER — Ambulatory Visit
Admission: RE | Admit: 2022-08-28 | Discharge: 2022-08-28 | Disposition: A | Discharge status: Home / Self Care | Payer: Medicare Other | Source: Ambulatory Visit | Attending: Radiation Oncology | Admitting: Radiation Oncology

## 2022-08-28 ENCOUNTER — Other Ambulatory Visit: Payer: Self-pay

## 2022-08-28 DIAGNOSIS — C3492 Malignant neoplasm of unspecified part of left bronchus or lung: Secondary | ICD-10-CM

## 2022-08-28 DIAGNOSIS — Z510 Encounter for antineoplastic radiation therapy: Secondary | ICD-10-CM | POA: Diagnosis not present

## 2022-08-28 DIAGNOSIS — C61 Malignant neoplasm of prostate: Secondary | ICD-10-CM | POA: Diagnosis not present

## 2022-08-28 LAB — RAD ONC ARIA SESSION SUMMARY
Course Elapsed Days: 36
Plan Fractions Treated to Date: 26
Plan Prescribed Dose Per Fraction: 2.5 Gy
Plan Total Fractions Prescribed: 28
Plan Total Prescribed Dose: 70 Gy
Reference Point Dosage Given to Date: 65 Gy
Reference Point Session Dosage Given: 2.5 Gy
Session Number: 26

## 2022-08-28 MED ORDER — MORPHINE SULFATE 15 MG PO TABS: 15.0000 mg | ORAL_TABLET | ORAL | 0 refills | Status: DC | PRN

## 2022-08-28 MED ORDER — FENTANYL 50 MCG/HR TD PT72: 1.0000 | MEDICATED_PATCH | TRANSDERMAL | 0 refills | Status: DC

## 2022-08-29 ENCOUNTER — Ambulatory Visit
Admission: RE | Admit: 2022-08-29 | Discharge: 2022-08-29 | Disposition: A | Discharge status: Home / Self Care | Payer: Medicare Other | Source: Ambulatory Visit | Attending: Radiation Oncology | Admitting: Radiation Oncology

## 2022-08-29 ENCOUNTER — Other Ambulatory Visit: Payer: Self-pay

## 2022-08-29 DIAGNOSIS — Z510 Encounter for antineoplastic radiation therapy: Secondary | ICD-10-CM | POA: Diagnosis not present

## 2022-08-29 DIAGNOSIS — C61 Malignant neoplasm of prostate: Secondary | ICD-10-CM | POA: Diagnosis not present

## 2022-08-29 LAB — RAD ONC ARIA SESSION SUMMARY
Course Elapsed Days: 37
Plan Fractions Treated to Date: 27
Plan Prescribed Dose Per Fraction: 2.5 Gy
Plan Total Fractions Prescribed: 28
Plan Total Prescribed Dose: 70 Gy
Reference Point Dosage Given to Date: 67.5 Gy
Reference Point Session Dosage Given: 2.5 Gy
Session Number: 27

## 2022-08-29 LAB — T4: T4, Total: 10.8 ug/dL (ref 4.5–12.0)

## 2022-09-02 ENCOUNTER — Encounter: Payer: Self-pay | Admitting: Urology

## 2022-09-02 ENCOUNTER — Ambulatory Visit: Payer: Medicare Other

## 2022-09-02 ENCOUNTER — Other Ambulatory Visit: Payer: Self-pay

## 2022-09-02 ENCOUNTER — Ambulatory Visit
Admission: RE | Admit: 2022-09-02 | Discharge: 2022-09-02 | Disposition: A | Discharge status: Home / Self Care | Payer: Medicare Other | Source: Ambulatory Visit | Attending: Radiation Oncology | Admitting: Radiation Oncology

## 2022-09-02 DIAGNOSIS — C61 Malignant neoplasm of prostate: Secondary | ICD-10-CM | POA: Diagnosis not present

## 2022-09-02 DIAGNOSIS — Z510 Encounter for antineoplastic radiation therapy: Secondary | ICD-10-CM | POA: Diagnosis not present

## 2022-09-02 LAB — RAD ONC ARIA SESSION SUMMARY
Course Elapsed Days: 41
Plan Fractions Treated to Date: 28
Plan Prescribed Dose Per Fraction: 2.5 Gy
Plan Total Fractions Prescribed: 28
Plan Total Prescribed Dose: 70 Gy
Reference Point Dosage Given to Date: 70 Gy
Reference Point Session Dosage Given: 2.5 Gy
Session Number: 28

## 2022-09-03 ENCOUNTER — Ambulatory Visit: Payer: Medicare Other

## 2022-09-03 ENCOUNTER — Other Ambulatory Visit: Payer: Self-pay

## 2022-09-05 ENCOUNTER — Ambulatory Visit (HOSPITAL_COMMUNITY)
Admission: RE | Admit: 2022-09-05 | Discharge: 2022-09-05 | Disposition: A | Discharge status: Home / Self Care | Payer: No Typology Code available for payment source | Source: Ambulatory Visit | Attending: Hematology | Admitting: Hematology

## 2022-09-05 DIAGNOSIS — C3492 Malignant neoplasm of unspecified part of left bronchus or lung: Secondary | ICD-10-CM

## 2022-09-15 ENCOUNTER — Encounter: Payer: Self-pay | Admitting: Hematology

## 2022-09-17 ENCOUNTER — Other Ambulatory Visit: Payer: Self-pay

## 2022-09-17 ENCOUNTER — Inpatient Hospital Stay: Payer: No Typology Code available for payment source | Attending: Hematology | Admitting: Hematology

## 2022-09-17 ENCOUNTER — Inpatient Hospital Stay: Payer: No Typology Code available for payment source

## 2022-09-17 ENCOUNTER — Encounter: Payer: Self-pay | Admitting: Hematology

## 2022-09-17 VITALS — BP 113/54 | HR 92 | Temp 97.7°F | Resp 18 | Wt 122.5 lb

## 2022-09-17 VITALS — BP 109/58 | HR 79 | Resp 17

## 2022-09-17 DIAGNOSIS — Z95828 Presence of other vascular implants and grafts: Secondary | ICD-10-CM

## 2022-09-17 DIAGNOSIS — Z79899 Other long term (current) drug therapy: Secondary | ICD-10-CM | POA: Diagnosis not present

## 2022-09-17 DIAGNOSIS — C61 Malignant neoplasm of prostate: Secondary | ICD-10-CM | POA: Insufficient documentation

## 2022-09-17 DIAGNOSIS — C3492 Malignant neoplasm of unspecified part of left bronchus or lung: Secondary | ICD-10-CM

## 2022-09-17 DIAGNOSIS — Z7189 Other specified counseling: Secondary | ICD-10-CM | POA: Diagnosis not present

## 2022-09-17 DIAGNOSIS — F17210 Nicotine dependence, cigarettes, uncomplicated: Secondary | ICD-10-CM | POA: Insufficient documentation

## 2022-09-17 DIAGNOSIS — C3412 Malignant neoplasm of upper lobe, left bronchus or lung: Secondary | ICD-10-CM

## 2022-09-17 DIAGNOSIS — Z8551 Personal history of malignant neoplasm of bladder: Secondary | ICD-10-CM | POA: Diagnosis not present

## 2022-09-17 DIAGNOSIS — G893 Neoplasm related pain (acute) (chronic): Secondary | ICD-10-CM | POA: Diagnosis not present

## 2022-09-17 DIAGNOSIS — C7951 Secondary malignant neoplasm of bone: Secondary | ICD-10-CM | POA: Diagnosis not present

## 2022-09-17 DIAGNOSIS — Z5112 Encounter for antineoplastic immunotherapy: Secondary | ICD-10-CM | POA: Insufficient documentation

## 2022-09-17 DIAGNOSIS — Z85858 Personal history of malignant neoplasm of other endocrine glands: Secondary | ICD-10-CM | POA: Insufficient documentation

## 2022-09-17 DIAGNOSIS — Z923 Personal history of irradiation: Secondary | ICD-10-CM | POA: Diagnosis not present

## 2022-09-17 LAB — CMP (CANCER CENTER ONLY)
ALT: 17 U/L (ref 0–44)
AST: 22 U/L (ref 15–41)
Albumin: 3.9 g/dL (ref 3.5–5.0)
Alkaline Phosphatase: 69 U/L (ref 38–126)
Anion gap: 5 (ref 5–15)
BUN: 22 mg/dL (ref 8–23)
CO2: 29 mmol/L (ref 22–32)
Calcium: 9.7 mg/dL (ref 8.9–10.3)
Chloride: 103 mmol/L (ref 98–111)
Creatinine: 0.82 mg/dL (ref 0.61–1.24)
GFR, Estimated: >60 mL/min (ref >60)
Glucose, Bld: 100 mg/dL — ABNORMAL HIGH (ref 70–99)
Potassium: 4.3 mmol/L (ref 3.5–5.1)
Sodium: 137 mmol/L (ref 135–145)
Total Bilirubin: 0.5 mg/dL (ref 0.3–1.2)
Total Protein: 6.8 g/dL (ref 6.5–8.1)

## 2022-09-17 LAB — CBC WITH DIFFERENTIAL (CANCER CENTER ONLY)
Abs Immature Granulocytes: 0.02 10*3/uL (ref 0.00–0.07)
Basophils Absolute: 0 10*3/uL (ref 0.0–0.1)
Basophils Relative: 0 %
Eosinophils Absolute: 0.2 10*3/uL (ref 0.0–0.5)
Eosinophils Relative: 2 %
HCT: 35 % — ABNORMAL LOW (ref 39.0–52.0)
Hemoglobin: 12.2 g/dL — ABNORMAL LOW (ref 13.0–17.0)
Immature Granulocytes: 0 %
Lymphocytes Relative: 4 %
Lymphs Abs: 0.3 10*3/uL — ABNORMAL LOW (ref 0.7–4.0)
MCH: 33.3 pg (ref 26.0–34.0)
MCHC: 34.9 g/dL (ref 30.0–36.0)
MCV: 95.6 fL (ref 80.0–100.0)
Monocytes Absolute: 0.7 10*3/uL (ref 0.1–1.0)
Monocytes Relative: 10 %
Neutro Abs: 6.4 10*3/uL (ref 1.7–7.7)
Neutrophils Relative %: 84 %
Platelet Count: 155 10*3/uL (ref 150–400)
RBC: 3.66 MIL/uL — ABNORMAL LOW (ref 4.22–5.81)
RDW: 14.3 % (ref 11.5–15.5)
WBC Count: 7.6 10*3/uL (ref 4.0–10.5)
nRBC: 0 % (ref 0.0–0.2)

## 2022-09-17 MED ORDER — HEPARIN SOD (PORK) LOCK FLUSH 100 UNIT/ML IV SOLN: 500.0000 [IU] | Freq: Once | INTRAVENOUS | Status: DC | PRN

## 2022-09-17 MED ORDER — SODIUM CHLORIDE 0.9 % IV SOLN: Freq: Once | INTRAVENOUS | Status: AC

## 2022-09-17 MED ORDER — SODIUM CHLORIDE 0.9 % IV SOLN
200.0000 mg | Freq: Once | INTRAVENOUS | Status: AC
  Administered 2022-09-17: 200 mg via INTRAVENOUS
  Filled 2022-09-17: qty 200

## 2022-09-17 MED ORDER — HEPARIN SOD (PORK) LOCK FLUSH 100 UNIT/ML IV SOLN
500.0000 [IU] | Freq: Once | INTRAVENOUS | Status: AC | PRN
  Administered 2022-09-17: 500 [IU]

## 2022-09-17 MED ORDER — ONDANSETRON HCL 8 MG PO TABS: 8.0000 mg | ORAL_TABLET | Freq: Three times a day (TID) | ORAL | 2 refills | Status: DC | PRN

## 2022-09-17 MED ORDER — FAMOTIDINE 20 MG PO TABS
20.0000 mg | ORAL_TABLET | Freq: Once | ORAL | Status: AC
  Administered 2022-09-17: 20 mg via ORAL
  Filled 2022-09-17: qty 1

## 2022-09-17 MED ORDER — SODIUM CHLORIDE 0.9% FLUSH
10.0000 mL | INTRAVENOUS | Status: DC | PRN
  Administered 2022-09-17: 10 mL

## 2022-09-17 MED ORDER — SODIUM CHLORIDE 0.9% FLUSH
10.0000 mL | Freq: Once | INTRAVENOUS | Status: AC
  Administered 2022-09-17: 10 mL

## 2022-09-17 MED ORDER — ALTEPLASE 2 MG IJ SOLR: 2.0000 mg | Freq: Once | INTRAMUSCULAR | Status: DC | PRN

## 2022-09-17 MED ORDER — DIPHENHYDRAMINE HCL 25 MG PO CAPS
25.0000 mg | ORAL_CAPSULE | Freq: Once | ORAL | Status: AC
  Administered 2022-09-17: 25 mg via ORAL
  Filled 2022-09-17: qty 1
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
## 2022-09-17 NOTE — Progress Notes (Signed)
Patient seen by MD today  Vitals are within treatment parameters.  Labs reviewed: and are within treatment parameters."  Per physician team, patient is ready for treatment and there are NO modifications to the treatment plan.

## 2022-09-17 NOTE — Patient Instructions (Signed)
[facility] ONCOLOGY  Discharge Instructions: Thank you for choosing [facility] to provide your oncology and hematology care.   If you have a lab appointment with the [facility], please go directly to the [facility] and check in at the registration area.   Wear comfortable clothing and clothing appropriate for easy access to any Portacath or PICC line.   We strive to give you quality time with your provider. You may need to reschedule your appointment if you arrive late (15 or more minutes).  Arriving late affects you and other patients whose appointments are after yours.  Also, if you miss three or more appointments without notifying the office, you may be dismissed from the clinic at the provider's discretion.      For prescription refill requests, have your pharmacy contact our office and allow 72 hours for refills to be completed.    Today you received the following chemotherapy and/or immunotherapy agents: pembrolizumab      To help prevent nausea and vomiting after your treatment, we encourage you to take your nausea medication as directed.  BELOW ARE SYMPTOMS THAT SHOULD BE REPORTED IMMEDIATELY: *FEVER GREATER THAN 100.4 F (38 C) OR HIGHER *CHILLS OR SWEATING *NAUSEA AND VOMITING THAT IS NOT CONTROLLED WITH YOUR NAUSEA MEDICATION *UNUSUAL SHORTNESS OF BREATH *UNUSUAL BRUISING OR BLEEDING *URINARY PROBLEMS (pain or burning when urinating, or frequent urination) *BOWEL PROBLEMS (unusual diarrhea, constipation, pain near the anus) TENDERNESS IN MOUTH AND THROAT WITH OR WITHOUT PRESENCE OF ULCERS (sore throat, sores in mouth, or a toothache) UNUSUAL RASH, SWELLING OR PAIN  UNUSUAL VAGINAL DISCHARGE OR ITCHING   Items with * indicate a potential emergency and should be followed up as soon as possible or go to the Emergency Department if any problems should occur.  Please show the CHEMOTHERAPY ALERT CARD or IMMUNOTHERAPY ALERT CARD at check-in  to the Emergency Department and triage nurse.  Should you have questions after your visit or need to cancel or reschedule your appointment, please contact [facility]  Dept: [phone]  and follow the prompts.  Office hours are 8:00 a.m. to 4:30 p.m. Monday - Friday. Please note that voicemails left after 4:00 p.m. may not be returned until the following business day.  We are closed weekends and major holidays. You have access to a nurse at all times for urgent questions. Please call the main number to the clinic Dept: [phone] and follow the prompts.   For any non-urgent questions, you may also contact your provider using MyChart. We now offer e-Visits for anyone 70 and older to request care online for non-urgent symptoms. For details visit [email or web address].   Also download the MyChart app! Go to the app store, search "MyChart", open the app, select [facility], and log in with your MyChart username and password.  Masks are optional in the cancer centers. If you would like for your care team to wear a mask while they are taking care of you, please let them know. You may have one support person who is at least 76 years old accompany you for your appointments.

## 2022-09-18 ENCOUNTER — Encounter: Payer: Self-pay | Admitting: Hematology

## 2022-09-20 ENCOUNTER — Other Ambulatory Visit: Payer: Self-pay

## 2022-09-24 ENCOUNTER — Other Ambulatory Visit: Payer: Self-pay

## 2022-09-24 DIAGNOSIS — C3492 Malignant neoplasm of unspecified part of left bronchus or lung: Secondary | ICD-10-CM

## 2022-09-25 ENCOUNTER — Ambulatory Visit (INDEPENDENT_AMBULATORY_CARE_PROVIDER_SITE_OTHER): Payer: No Typology Code available for payment source | Admitting: Family Medicine

## 2022-09-25 ENCOUNTER — Encounter: Payer: Self-pay | Admitting: Hematology

## 2022-09-25 ENCOUNTER — Ambulatory Visit (HOSPITAL_BASED_OUTPATIENT_CLINIC_OR_DEPARTMENT_OTHER): Payer: No Typology Code available for payment source | Admitting: Nurse Practitioner

## 2022-09-25 ENCOUNTER — Other Ambulatory Visit (HOSPITAL_BASED_OUTPATIENT_CLINIC_OR_DEPARTMENT_OTHER): Payer: Self-pay

## 2022-09-25 ENCOUNTER — Encounter (HOSPITAL_BASED_OUTPATIENT_CLINIC_OR_DEPARTMENT_OTHER): Payer: Self-pay | Admitting: Family Medicine

## 2022-09-25 DIAGNOSIS — J439 Emphysema, unspecified: Secondary | ICD-10-CM | POA: Diagnosis not present

## 2022-09-25 DIAGNOSIS — J4489 Other specified chronic obstructive pulmonary disease: Secondary | ICD-10-CM

## 2022-09-25 MED ORDER — ALBUTEROL SULFATE HFA 108 (90 BASE) MCG/ACT IN AERS: 1.0000 | INHALATION_SPRAY | Freq: Four times a day (QID) | RESPIRATORY_TRACT | 1 refills | Status: DC | PRN

## 2022-09-25 MED ORDER — TRELEGY ELLIPTA 100-62.5-25 MCG/ACT IN AEPB: INHALATION_SPRAY | RESPIRATORY_TRACT | 3 refills | Status: DC

## 2022-09-25 MED ORDER — COMIRNATY 30 MCG/0.3ML IM SUSY
PREFILLED_SYRINGE | INTRAMUSCULAR | 0 refills | Status: DC
  Filled 2022-09-25: qty 0.3, 1d supply, fill #0

## 2022-09-25 NOTE — Assessment & Plan Note (Signed)
He is requesting refill of Trelegy as well as his albuterol inhaler.  Refill of inhaler sent to pharmacy on file.  He reports that he has been adherent with both inhalers.  He was following with a pulmonologist previously, however it has been a few years since his last appointment with them and the provider that he was seeing had gone on leave and he did not end up following back up. Given his history, feel that it would be ideal for him to have follow-up with pulmonologist.  Discussed that we do have a pulmonology group here at [facility].  He is interested in establishing with them, referral placed today

## 2022-09-25 NOTE — Progress Notes (Signed)
                                                                                                                                                             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    Intent: Curative  Radiation Treatment Dates: 07/23/2022 through 09/02/2022 Site Technique Total Dose (Gy) Dose per Fx (Gy) Completed Fx Beam Energies  Prostate: Prostate IMRT 70/70 2.5 28/28 6X   Narrative: The patient tolerated radiation therapy relatively well. He reported increased urgency that was managed with Flomax daily. He also experiences some modest fatigue.  Plan: The patient will receive a call in about one month from the radiation oncology department. He will continue follow up with his urologist, Dr. Mena Goes as well.  ------------------------------------------------   Margaretmary Dys, MD [facility]  Radiation Oncology Direct Dial: [phone]  Fax: [phone] [email or web address]  Skype  LinkedIn

## 2022-09-25 NOTE — Progress Notes (Signed)
    Procedures performed today:    None.  Independent interpretation of notes and tests performed by another provider:   None.  Brief History, Exam, Impression, and Recommendations:    BP 122/74 (BP Location: Right Arm, Patient Position: Sitting, Cuff Size: Normal)   Pulse 75   Ht 5\' 10"  (1.778 m)   Wt 124 lb 9.6 oz (56.5 kg)   SpO2 99%   BMI 17.88 kg/m   Patient with past medical history significant for hypertension, COPD, squamous cell lung cancer, prostate cancer, cancer related pain.  He previously established in the clinic with Laretta Bolster Early who has transition into a new office.  He presents today for transition of care.  He is needing some refills today.  COPD with chronic bronchitis and emphysema ([place]) He is requesting refill of Trelegy as well as his albuterol inhaler.  Refill of inhaler sent to pharmacy on file.  He reports that he has been adherent with both inhalers.  He was following with a pulmonologist previously, however it has been a few years since his last appointment with them and the provider that he was seeing had gone on leave and he did not end up following back up. Given his history, feel that it would be ideal for him to have follow-up with pulmonologist.  Discussed that we do have a pulmonology group here at [facility].  He is interested in establishing with them, referral placed today  Reviewed recently completed labs within the past few weeks.  No concerning changes noted.  We will not complete labs today.  At next appointment, we will again review labs and determine if any laboratory follow-up is needed at that time  Return in about 3 months (around 12/25/2022).   ___________________________________________ Zakkary Thibault de [place], MD, ABFM, CAQSM [facility] [place]

## 2022-09-26 ENCOUNTER — Other Ambulatory Visit: Payer: Self-pay

## 2022-09-26 MED ORDER — FENTANYL 50 MCG/HR TD PT72: 1.0000 | MEDICATED_PATCH | TRANSDERMAL | 0 refills | Status: DC

## 2022-09-26 MED ORDER — MORPHINE SULFATE 15 MG PO TABS: 15.0000 mg | ORAL_TABLET | ORAL | 0 refills | Status: DC | PRN

## 2022-09-29 ENCOUNTER — Telehealth (HOSPITAL_BASED_OUTPATIENT_CLINIC_OR_DEPARTMENT_OTHER): Payer: Self-pay | Admitting: Family Medicine

## 2022-09-29 NOTE — Telephone Encounter (Signed)
Left message for patient to call back and schedule Medicare Annual Wellness Visit (AWV) in office.   If not able to come in office, please offer to do virtually or by telephone.  Left office number and my jabber [phone].  AWVI eligible as of 08/08/2013  Please schedule at anytime with Nurse Health Advisor.

## 2022-09-30 ENCOUNTER — Encounter (HOSPITAL_BASED_OUTPATIENT_CLINIC_OR_DEPARTMENT_OTHER): Payer: Self-pay

## 2022-10-02 ENCOUNTER — Encounter: Payer: Self-pay | Admitting: Hematology

## 2022-10-06 ENCOUNTER — Other Ambulatory Visit (HOSPITAL_BASED_OUTPATIENT_CLINIC_OR_DEPARTMENT_OTHER): Payer: Self-pay

## 2022-10-06 NOTE — Progress Notes (Signed)
  Radiation Oncology         [phone] ________________________________  Name: STANLY SI MRN: [id number]  Date of Service: 10/07/2022  DOB: August 22, 1946  Post Treatment Telephone Note  Diagnosis:  77 y.o. 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    Intent: Curative  Radiation Treatment Dates: 07/23/2022 through 09/02/2022 Site Technique Total Dose (Gy) Dose per Fx (Gy) Completed Fx Beam Energies  Prostate: Prostate IMRT 70/70 2.5 28/28 6X   (as documented in provider EOT note)   Pre Treatment IPSS Score: 11 (as documented in the provider consult note)  The patient was not available for call today. A detailed voicemail was left.   Patient does not currently have any scheduled follow up with his urologist to his knowledge so he was advised to call [facility] to schedule his post-treatment follow up with Dr. Junious Silk for ongoing surveillance. He was counseled that PSA levels will be drawn in the urology office, and was reassured that additional time is expected to improve bowel and bladder symptoms. He was encouraged to call back with concerns or questions regarding radiation.    Leandra Kern, LPN

## 2022-10-07 ENCOUNTER — Ambulatory Visit
Admission: RE | Admit: 2022-10-07 | Discharge: 2022-10-07 | Disposition: A | Discharge status: Home / Self Care | Payer: No Typology Code available for payment source | Source: Ambulatory Visit | Attending: Radiation Oncology | Admitting: Radiation Oncology

## 2022-10-07 DIAGNOSIS — C61 Malignant neoplasm of prostate: Secondary | ICD-10-CM | POA: Insufficient documentation

## 2022-10-07 DIAGNOSIS — Z510 Encounter for antineoplastic radiation therapy: Secondary | ICD-10-CM | POA: Insufficient documentation

## 2022-10-08 ENCOUNTER — Inpatient Hospital Stay: Payer: No Typology Code available for payment source

## 2022-10-08 VITALS — BP 118/63 | HR 72 | Temp 97.7°F | Resp 18 | Wt 124.0 lb

## 2022-10-08 DIAGNOSIS — C3492 Malignant neoplasm of unspecified part of left bronchus or lung: Secondary | ICD-10-CM

## 2022-10-08 DIAGNOSIS — Z95828 Presence of other vascular implants and grafts: Secondary | ICD-10-CM

## 2022-10-08 DIAGNOSIS — Z7189 Other specified counseling: Secondary | ICD-10-CM

## 2022-10-08 DIAGNOSIS — C3412 Malignant neoplasm of upper lobe, left bronchus or lung: Secondary | ICD-10-CM

## 2022-10-08 DIAGNOSIS — C7951 Secondary malignant neoplasm of bone: Secondary | ICD-10-CM

## 2022-10-08 DIAGNOSIS — Z5112 Encounter for antineoplastic immunotherapy: Secondary | ICD-10-CM | POA: Diagnosis not present

## 2022-10-08 LAB — CMP (CANCER CENTER ONLY)
ALT: 15 U/L (ref 0–44)
AST: 25 U/L (ref 15–41)
Albumin: 3.8 g/dL (ref 3.5–5.0)
Alkaline Phosphatase: 67 U/L (ref 38–126)
Anion gap: 8 (ref 5–15)
BUN: 24 mg/dL — ABNORMAL HIGH (ref 8–23)
CO2: 28 mmol/L (ref 22–32)
Calcium: 9.2 mg/dL (ref 8.9–10.3)
Chloride: 102 mmol/L (ref 98–111)
Creatinine: 0.94 mg/dL (ref 0.61–1.24)
GFR, Estimated: >60 mL/min (ref >60)
Glucose, Bld: 85 mg/dL (ref 70–99)
Potassium: 4.3 mmol/L (ref 3.5–5.1)
Sodium: 138 mmol/L (ref 135–145)
Total Bilirubin: 0.6 mg/dL (ref 0.3–1.2)
Total Protein: 7 g/dL (ref 6.5–8.1)

## 2022-10-08 LAB — CBC WITH DIFFERENTIAL (CANCER CENTER ONLY)
Abs Immature Granulocytes: 0.02 10*3/uL (ref 0.00–0.07)
Basophils Absolute: 0 10*3/uL (ref 0.0–0.1)
Basophils Relative: 0 %
Eosinophils Absolute: 0.1 10*3/uL (ref 0.0–0.5)
Eosinophils Relative: 2 %
HCT: 34.4 % — ABNORMAL LOW (ref 39.0–52.0)
Hemoglobin: 11.9 g/dL — ABNORMAL LOW (ref 13.0–17.0)
Immature Granulocytes: 0 %
Lymphocytes Relative: 6 %
Lymphs Abs: 0.5 10*3/uL — ABNORMAL LOW (ref 0.7–4.0)
MCH: 33 pg (ref 26.0–34.0)
MCHC: 34.6 g/dL (ref 30.0–36.0)
MCV: 95.3 fL (ref 80.0–100.0)
Monocytes Absolute: 0.7 10*3/uL (ref 0.1–1.0)
Monocytes Relative: 10 %
Neutro Abs: 5.7 10*3/uL (ref 1.7–7.7)
Neutrophils Relative %: 82 %
Platelet Count: 154 10*3/uL (ref 150–400)
RBC: 3.61 MIL/uL — ABNORMAL LOW (ref 4.22–5.81)
RDW: 13.7 % (ref 11.5–15.5)
WBC Count: 7.1 10*3/uL (ref 4.0–10.5)
nRBC: 0 % (ref 0.0–0.2)

## 2022-10-08 MED ORDER — SODIUM CHLORIDE 0.9 % IV SOLN: Freq: Once | INTRAVENOUS | Status: AC

## 2022-10-08 MED ORDER — FAMOTIDINE 20 MG PO TABS
20.0000 mg | ORAL_TABLET | Freq: Once | ORAL | Status: AC
  Administered 2022-10-08: 20 mg via ORAL
  Filled 2022-10-08: qty 1

## 2022-10-08 MED ORDER — SODIUM CHLORIDE 0.9 % IV SOLN
200.0000 mg | Freq: Once | INTRAVENOUS | Status: AC
  Administered 2022-10-08: 200 mg via INTRAVENOUS
  Filled 2022-10-08: qty 8

## 2022-10-08 MED ORDER — DIPHENHYDRAMINE HCL 25 MG PO CAPS
25.0000 mg | ORAL_CAPSULE | Freq: Once | ORAL | Status: AC
  Administered 2022-10-08: 25 mg via ORAL
  Filled 2022-10-08: qty 1

## 2022-10-08 MED ORDER — SODIUM CHLORIDE 0.9% FLUSH
10.0000 mL | INTRAVENOUS | Status: DC | PRN
  Administered 2022-10-08: 10 mL

## 2022-10-08 MED ORDER — HEPARIN SOD (PORK) LOCK FLUSH 100 UNIT/ML IV SOLN
500.0000 [IU] | Freq: Once | INTRAVENOUS | Status: AC | PRN
  Administered 2022-10-08: 500 [IU]

## 2022-10-08 NOTE — Patient Instructions (Signed)
[facility]  Discharge Instructions: Thank you for choosing [facility] to provide your oncology and hematology care.   If you have a lab appointment with the [facility], please go directly to the [facility] and check in at the registration area.   Wear comfortable clothing and clothing appropriate for easy access to any Portacath or PICC line.   We strive to give you quality time with your provider. You may need to reschedule your appointment if you arrive late (15 or more minutes).  Arriving late affects you and other patients whose appointments are after yours.  Also, if you miss three or more appointments without notifying the office, you may be dismissed from the clinic at the provider's discretion.      For prescription refill requests, have your pharmacy contact our office and allow 72 hours for refills to be completed.    Today you received the following chemotherapy and/or immunotherapy agents: pembrolizumab      To help prevent nausea and vomiting after your treatment, we encourage you to take your nausea medication as directed.  BELOW ARE SYMPTOMS THAT SHOULD BE REPORTED IMMEDIATELY: *FEVER GREATER THAN 100.4 F (38 C) OR HIGHER *CHILLS OR SWEATING *NAUSEA AND VOMITING THAT IS NOT CONTROLLED WITH YOUR NAUSEA MEDICATION *UNUSUAL SHORTNESS OF BREATH *UNUSUAL BRUISING OR BLEEDING *URINARY PROBLEMS (pain or burning when urinating, or frequent urination) *BOWEL PROBLEMS (unusual diarrhea, constipation, pain near the anus) TENDERNESS IN MOUTH AND THROAT WITH OR WITHOUT PRESENCE OF ULCERS (sore throat, sores in mouth, or a toothache) UNUSUAL RASH, SWELLING OR PAIN  UNUSUAL VAGINAL DISCHARGE OR ITCHING   Items with * indicate a potential emergency and should be followed up as soon as possible or go to the Emergency Department if any problems should occur.  Please show the CHEMOTHERAPY ALERT CARD or IMMUNOTHERAPY ALERT CARD at  check-in to the Emergency Department and triage nurse.  Should you have questions after your visit or need to cancel or reschedule your appointment, please contact [facility]  Dept: [phone]  and follow the prompts.  Office hours are 8:00 a.m. to 4:30 p.m. Monday - Friday. Please note that voicemails left after 4:00 p.m. may not be returned until the following business day.  We are closed weekends and major holidays. You have access to a nurse at all times for urgent questions. Please call the main number to the clinic Dept: [phone] and follow the prompts.   For any non-urgent questions, you may also contact your provider using MyChart. We now offer e-Visits for anyone 50 and older to request care online for non-urgent symptoms. For details visit [email or web address].   Also download the MyChart app! Go to the app store, search "MyChart", open the app, select [facility], and log in with your MyChart username and password.  Masks are optional in the cancer centers. If you would like for your care team to wear a mask while they are taking care of you, please let them know. You may have one support person who is at least 76 years old accompany you for your appointments.

## 2022-10-09 ENCOUNTER — Telehealth: Payer: Self-pay

## 2022-10-09 NOTE — Telephone Encounter (Signed)
Contacted pt to let him know that insurance had approved his pain medication top be covered through the end of 2024. Pt acknowledged and verbalized understanding.

## 2022-10-17 ENCOUNTER — Ambulatory Visit: Payer: Medicare Other | Admitting: Pulmonary Disease

## 2022-10-17 ENCOUNTER — Encounter: Payer: Self-pay | Admitting: Pulmonary Disease

## 2022-10-17 VITALS — BP 114/72 | HR 70 | Ht 70.0 in | Wt 124.0 lb

## 2022-10-17 DIAGNOSIS — J4489 Other specified chronic obstructive pulmonary disease: Secondary | ICD-10-CM | POA: Diagnosis not present

## 2022-10-17 DIAGNOSIS — J439 Emphysema, unspecified: Secondary | ICD-10-CM | POA: Diagnosis not present
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
## 2022-10-17 NOTE — Patient Instructions (Signed)
Continue to use trelegy 1 puff daily - rinse mouth out after each use  Continue to use albuterol inhaler or duoneb nebulizer treatment as needed  Follow up in 6 months

## 2022-10-18 ENCOUNTER — Other Ambulatory Visit: Payer: Self-pay | Admitting: Hematology

## 2022-10-18 ENCOUNTER — Other Ambulatory Visit: Payer: Self-pay | Admitting: Cardiovascular Disease

## 2022-10-20 ENCOUNTER — Ambulatory Visit (HOSPITAL_COMMUNITY)
Admission: RE | Admit: 2022-10-20 | Discharge: 2022-10-20 | Disposition: A | Discharge status: Home / Self Care | Payer: No Typology Code available for payment source | Source: Ambulatory Visit | Attending: Hematology | Admitting: Hematology

## 2022-10-20 DIAGNOSIS — C7951 Secondary malignant neoplasm of bone: Secondary | ICD-10-CM | POA: Insufficient documentation

## 2022-10-20 DIAGNOSIS — C3492 Malignant neoplasm of unspecified part of left bronchus or lung: Secondary | ICD-10-CM

## 2022-10-20 LAB — GLUCOSE, CAPILLARY: Glucose-Capillary: 93 mg/dL (ref 70–99)

## 2022-10-20 MED ORDER — FLUDEOXYGLUCOSE F - 18 (FDG) INJECTION
6.1900 | Freq: Once | INTRAVENOUS | Status: AC | PRN
  Administered 2022-10-20: 6.19 via INTRAVENOUS

## 2022-10-21 ENCOUNTER — Ambulatory Visit (HOSPITAL_BASED_OUTPATIENT_CLINIC_OR_DEPARTMENT_OTHER): Payer: No Typology Code available for payment source

## 2022-10-21 ENCOUNTER — Encounter (HOSPITAL_BASED_OUTPATIENT_CLINIC_OR_DEPARTMENT_OTHER): Payer: Self-pay

## 2022-10-21 VITALS — BP 125/62 | HR 66 | Temp 98.8°F | Ht 70.0 in | Wt 122.7 lb

## 2022-10-21 DIAGNOSIS — Z0000 Encounter for general adult medical examination without abnormal findings: Secondary | ICD-10-CM | POA: Diagnosis not present

## 2022-10-21 NOTE — Patient Instructions (Addendum)
Kyle Foster , Thank you for taking time to come for your Medicare Wellness Visit. I appreciate your ongoing commitment to your health goals. Please review the following plan we discussed and let me know if I can assist you in the future.   These are the goals we discussed:  Goals       No current goals (pt-stated)        This is a list of the screening recommended for you and due dates:  Health Maintenance  Topic Date Due   Colon Cancer Screening  04/01/2023*   COVID-19 Vaccine (7 - 2023-24 season) 11/20/2022   Pneumonia Vaccine  Completed   Flu Shot  Completed   Hepatitis C Screening: USPSTF Recommendation to screen - Ages 18-79 yo.  Completed   Zoster (Shingles) Vaccine  Completed   HPV Vaccine  Aged Out   DTaP/Tdap/Td vaccine  Discontinued  *Topic was postponed. The date shown is not the original due date.  Opioid Pain Medicine Management Opioids are powerful medicines that are used to treat moderate to severe pain. When used for short periods of time, they can help you to: Sleep better. Do better in physical or occupational therapy. Feel better in the first few days after an injury. Recover from surgery. Opioids should be taken with the supervision of a trained health care provider. They should be taken for the shortest period of time possible. This is because opioids can be addictive, and the longer you take opioids, the greater your risk of addiction. This addiction can also be called opioid use disorder. What are the risks? Using opioid pain medicines for longer than 3 days increases your risk of side effects. Side effects include: Constipation. Nausea and vomiting. Breathing difficulties (respiratory depression). Drowsiness. Confusion. Opioid use disorder. Itching. Taking opioid pain medicine for a long period of time can affect your ability to do daily tasks. It also puts you at risk for: Motor vehicle crashes. Depression. Suicide. Heart attack. Overdose, which can  be life-threatening. What is a pain treatment plan? A pain treatment plan is an agreement between you and your health care provider. Pain is unique to each person, and treatments vary depending on your condition. To manage your pain, you and your health care provider need to work together. To help you do this: Discuss the goals of your treatment, including how much pain you might expect to have and how you will manage the pain. Review the risks and benefits of taking opioid medicines. Remember that a good treatment plan uses more than one approach and minimizes the chance of side effects. Be honest about the amount of medicines you take and about any drug or alcohol use. Get pain medicine prescriptions from only one health care provider. Pain can be managed with many types of alternative treatments. Ask your health care provider to refer you to one or more specialists who can help you manage pain through: Physical or occupational therapy. Counseling (cognitive behavioral therapy). Good nutrition. Biofeedback. Massage. Meditation. Non-opioid medicine. Following a gentle exercise program. How to use opioid pain medicine Taking medicine Take your pain medicine exactly as told by your health care provider. Take it only when you need it. If your pain gets less severe, you may take less than your prescribed dose if your health care provider approves. If you are not having pain, do nottake pain medicine unless your health care provider tells you to take it. If your pain is severe, do nottry to treat it yourself by  taking more pills than instructed on your prescription. Contact your health care provider for help. Write down the times when you take your pain medicine. It is easy to become confused while on pain medicine. Writing the time can help you avoid overdose. Take other over-the-counter or prescription medicines only as told by your health care provider. Keeping yourself and others  safe  While you are taking opioid pain medicine: Do not drive, use machinery, or power tools. Do not sign legal documents. Do not drink alcohol. Do not take sleeping pills. Do not supervise children by yourself. Do not do activities that require climbing or being in high places. Do not go to a lake, river, ocean, spa, or swimming pool. Do not share your pain medicine with anyone. Keep pain medicine in a locked cabinet or in a secure area where pets and children cannot reach it. Stopping your use of opioids If you have been taking opioid medicine for more than a few weeks, you may need to slowly decrease (taper) how much you take until you stop completely. Tapering your use of opioids can decrease your risk of symptoms of withdrawal, such as: Pain and cramping in the abdomen. Nausea. Sweating. Sleepiness. Restlessness. Uncontrollable shaking (tremors). Cravings for the medicine. Do not attempt to taper your use of opioids on your own. Talk with your health care provider about how to do this. Your health care provider may prescribe a step-down schedule based on how much medicine you are taking and how long you have been taking it. Getting rid of leftover pills Do not save any leftover pills. Get rid of leftover pills safely by: Taking the medicine to a prescription take-back program. This is usually offered by the county or law enforcement. Bringing them to a pharmacy that has a drug disposal container. Flushing them down the toilet. Check the label or package insert of your medicine to see whether this is safe to do. Throwing them out in the trash. Check the label or package insert of your medicine to see whether this is safe to do. If it is safe to throw it out, remove the medicine from the original container, put it into a sealable bag or container, and mix it with used coffee grounds, food scraps, dirt, or cat litter before putting it in the trash. Follow these instructions at  home: Activity Do exercises as told by your health care provider. Avoid activities that make your pain worse. Return to your normal activities as told by your health care provider. Ask your health care provider what activities are safe for you. General instructions You may need to take these actions to prevent or treat constipation: Drink enough fluid to keep your urine pale yellow. Take over-the-counter or prescription medicines. Eat foods that are high in fiber, such as beans, whole grains, and fresh fruits and vegetables. Limit foods that are high in fat and processed sugars, such as fried or sweet foods. Keep all follow-up visits. This is important. Where to find support If you have been taking opioids for a long time, you may benefit from receiving support for quitting from a local support group or counselor. Ask your health care provider for a referral to these resources in your area. Where to find more information Centers for Disease Control and Prevention (CDC): http://www.wolf.info/ [place] Food and Drug Administration (FDA): [place] Get help right away if: You may have taken too much of an opioid (overdosed). Common symptoms of an overdose: Your breathing is slower or more  shallow than normal. You have a very slow heartbeat (pulse). You have slurred speech. You have nausea and vomiting. Your pupils become very small. You have other potential symptoms: You are very confused. You faint or feel like you will faint. You have cold, clammy skin. You have blue lips or fingernails. You have thoughts of harming yourself or harming others. These symptoms may represent a serious problem that is an emergency. Do not wait to see if the symptoms will go away. Get medical help right away. Call your local emergency services (911 in the [place] Do not drive yourself to the hospital.  If you ever feel like you may hurt yourself or others, or have thoughts about taking your own life, get help right away.  Go to your nearest emergency department or: Call your local emergency services (911 in the [place] Call the [facility] 779-110-2791 in the [place] Call a suicide crisis helpline, such as the [place] at 434-857-6651 or 988 in the [place]. This is open 24 hours a day in the [place] Text the Crisis Text Line at 2796534366 (in the [place] Summary Opioid medicines can help you manage moderate to severe pain for a short period of time. A pain treatment plan is an agreement between you and your health care provider. Discuss the goals of your treatment, including how much pain you might expect to have and how you will manage the pain. If you think that you or someone else may have taken too much of an opioid, get medical help right away. This information is not intended to replace advice given to you by your health care provider. Make sure you discuss any questions you have with your health care provider. Document Revised: 03/20/2021 Document Reviewed: 12/05/2020 Elsevier Patient Education  [place] directives: Please bring a copy of your health care power of attorney and living will to the office to be added to your chart at your convenience.   Conditions/risks identified: None  Next appointment: Follow up in one year for your annual wellness visit.    Preventive Care 38 Years and Older, Male  Preventive care refers to lifestyle choices and visits with your health care provider that can promote health and wellness. What does preventive care include? A yearly physical exam. This is also called an annual well check. Dental exams once or twice a year. Routine eye exams. Ask your health care provider how often you should have your eyes checked. Personal lifestyle choices, including: Daily care of your teeth and gums. Regular physical activity. Eating a healthy diet. Avoiding tobacco and drug use. Limiting alcohol use. Practicing  safe sex. Taking low doses of aspirin every day. Taking vitamin and mineral supplements as recommended by your health care provider. What happens during an annual well check? The services and screenings done by your health care provider during your annual well check will depend on your age, overall health, lifestyle risk factors, and family history of disease. Counseling  Your health care provider may ask you questions about your: Alcohol use. Tobacco use. Drug use. Emotional well-being. Home and relationship well-being. Sexual activity. Eating habits. History of falls. Memory and ability to understand (cognition). Work and work Statistician. Screening  You may have the following tests or measurements: Height, weight, and BMI. Blood pressure. Lipid and cholesterol levels. These may be checked every 5 years, or more frequently if you are over 67 years old. Skin check. Lung cancer screening. You may have  this screening every year starting at age 1 if you have a 30-pack-year history of smoking and currently smoke or have quit within the past 15 years. Fecal occult blood test (FOBT) of the stool. You may have this test every year starting at age 26. Flexible sigmoidoscopy or colonoscopy. You may have a sigmoidoscopy every 5 years or a colonoscopy every 10 years starting at age 53. Prostate cancer screening. Recommendations will vary depending on your family history and other risks. Hepatitis C blood test. Hepatitis B blood test. Sexually transmitted disease (STD) testing. Diabetes screening. This is done by checking your blood sugar (glucose) after you have not eaten for a while (fasting). You may have this done every 1-3 years. Abdominal aortic aneurysm (AAA) screening. You may need this if you are a current or former smoker. Osteoporosis. You may be screened starting at age 68 if you are at high risk. Talk with your health care provider about your test results, treatment options, and  if necessary, the need for more tests. Vaccines  Your health care provider may recommend certain vaccines, such as: Influenza vaccine. This is recommended every year. Tetanus, diphtheria, and acellular pertussis (Tdap, Td) vaccine. You may need a Td booster every 10 years. Zoster vaccine. You may need this after age 49. Pneumococcal 13-valent conjugate (PCV13) vaccine. One dose is recommended after age 34. Pneumococcal polysaccharide (PPSV23) vaccine. One dose is recommended after age 43. Talk to your health care provider about which screenings and vaccines you need and how often you need them. This information is not intended to replace advice given to you by your health care provider. Make sure you discuss any questions you have with your health care provider. Document Released: 09/21/2015 Document Revised: 05/14/2016 Document Reviewed: 06/26/2015 Elsevier Interactive Patient Education  2017 [place] Prevention in the Home Falls can cause injuries. They can happen to people of all ages. There are many things you can do to make your home safe and to help prevent falls. What can I do on the outside of my home? Regularly fix the edges of walkways and driveways and fix any cracks. Remove anything that might make you trip as you walk through a door, such as a raised step or threshold. Trim any bushes or trees on the path to your home. Use bright outdoor lighting. Clear any walking paths of anything that might make someone trip, such as rocks or tools. Regularly check to see if handrails are loose or broken. Make sure that both sides of any steps have handrails. Any raised decks and porches should have guardrails on the edges. Have any leaves, snow, or ice cleared regularly. Use sand or salt on walking paths during winter. Clean up any spills in your garage right away. This includes oil or grease spills. What can I do in the bathroom? Use night lights. Install grab bars by the  toilet and in the tub and shower. Do not use towel bars as grab bars. Use non-skid mats or decals in the tub or shower. If you need to sit down in the shower, use a plastic, non-slip stool. Keep the floor dry. Clean up any water that spills on the floor as soon as it happens. Remove soap buildup in the tub or shower regularly. Attach bath mats securely with double-sided non-slip rug tape. Do not have throw rugs and other things on the floor that can make you trip. What can I do in the bedroom? Use night lights. Make sure that  you have a light by your bed that is easy to reach. Do not use any sheets or blankets that are too big for your bed. They should not hang down onto the floor. Have a firm chair that has side arms. You can use this for support while you get dressed. Do not have throw rugs and other things on the floor that can make you trip. What can I do in the kitchen? Clean up any spills right away. Avoid walking on wet floors. Keep items that you use a lot in easy-to-reach places. If you need to reach something above you, use a strong step stool that has a grab bar. Keep electrical cords out of the way. Do not use floor polish or wax that makes floors slippery. If you must use wax, use non-skid floor wax. Do not have throw rugs and other things on the floor that can make you trip. What can I do with my stairs? Do not leave any items on the stairs. Make sure that there are handrails on both sides of the stairs and use them. Fix handrails that are broken or loose. Make sure that handrails are as long as the stairways. Check any carpeting to make sure that it is firmly attached to the stairs. Fix any carpet that is loose or worn. Avoid having throw rugs at the top or bottom of the stairs. If you do have throw rugs, attach them to the floor with carpet tape. Make sure that you have a light switch at the top of the stairs and the bottom of the stairs. If you do not have them, ask someone  to add them for you. What else can I do to help prevent falls? Wear shoes that: Do not have high heels. Have rubber bottoms. Are comfortable and fit you well. Are closed at the toe. Do not wear sandals. If you use a stepladder: Make sure that it is fully opened. Do not climb a closed stepladder. Make sure that both sides of the stepladder are locked into place. Ask someone to hold it for you, if possible. Clearly mark and make sure that you can see: Any grab bars or handrails. First and last steps. Where the edge of each step is. Use tools that help you move around (mobility aids) if they are needed. These include: Canes. Walkers. Scooters. Crutches. Turn on the lights when you go into a dark area. Replace any light bulbs as soon as they burn out. Set up your furniture so you have a clear path. Avoid moving your furniture around. If any of your floors are uneven, fix them. If there are any pets around you, be aware of where they are. Review your medicines with your doctor. Some medicines can make you feel dizzy. This can increase your chance of falling. Ask your doctor what other things that you can do to help prevent falls. This information is not intended to replace advice given to you by your health care provider. Make sure you discuss any questions you have with your health care provider. Document Released: 06/21/2009 Document Revised: 01/31/2016 Document Reviewed: 09/29/2014 Elsevier Interactive Patient Education  2017 [employer].
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
## 2022-10-22 ENCOUNTER — Other Ambulatory Visit: Payer: Self-pay

## 2022-10-22 DIAGNOSIS — C3492 Malignant neoplasm of unspecified part of left bronchus or lung: Secondary | ICD-10-CM

## 2022-10-22 MED ORDER — FENTANYL 50 MCG/HR TD PT72: 1.0000 | MEDICATED_PATCH | TRANSDERMAL | 0 refills | Status: DC

## 2022-10-22 MED ORDER — MORPHINE SULFATE 15 MG PO TABS: 15.0000 mg | ORAL_TABLET | ORAL | 0 refills | Status: DC | PRN

## 2022-10-22 MED ORDER — ONDANSETRON HCL 8 MG PO TABS: 8.0000 mg | ORAL_TABLET | Freq: Three times a day (TID) | ORAL | 2 refills | Status: DC | PRN

## 2022-10-29 ENCOUNTER — Other Ambulatory Visit: Payer: Self-pay

## 2022-10-29 ENCOUNTER — Inpatient Hospital Stay: Payer: No Typology Code available for payment source | Attending: Hematology | Admitting: Hematology

## 2022-10-29 ENCOUNTER — Inpatient Hospital Stay: Payer: No Typology Code available for payment source

## 2022-10-29 VITALS — BP 108/47 | HR 77 | Resp 16

## 2022-10-29 DIAGNOSIS — Z5112 Encounter for antineoplastic immunotherapy: Secondary | ICD-10-CM | POA: Insufficient documentation

## 2022-10-29 DIAGNOSIS — Z7189 Other specified counseling: Secondary | ICD-10-CM | POA: Diagnosis not present

## 2022-10-29 DIAGNOSIS — Z79899 Other long term (current) drug therapy: Secondary | ICD-10-CM | POA: Insufficient documentation

## 2022-10-29 DIAGNOSIS — C3492 Malignant neoplasm of unspecified part of left bronchus or lung: Secondary | ICD-10-CM | POA: Diagnosis not present

## 2022-10-29 DIAGNOSIS — C3412 Malignant neoplasm of upper lobe, left bronchus or lung: Secondary | ICD-10-CM

## 2022-10-29 DIAGNOSIS — C7951 Secondary malignant neoplasm of bone: Secondary | ICD-10-CM | POA: Diagnosis not present

## 2022-10-29 DIAGNOSIS — Z95828 Presence of other vascular implants and grafts: Secondary | ICD-10-CM

## 2022-10-29 DIAGNOSIS — C61 Malignant neoplasm of prostate: Secondary | ICD-10-CM | POA: Diagnosis not present

## 2022-10-29 DIAGNOSIS — F17210 Nicotine dependence, cigarettes, uncomplicated: Secondary | ICD-10-CM | POA: Diagnosis not present

## 2022-10-29 DIAGNOSIS — G893 Neoplasm related pain (acute) (chronic): Secondary | ICD-10-CM | POA: Insufficient documentation

## 2022-10-29 LAB — CBC WITH DIFFERENTIAL (CANCER CENTER ONLY)
Abs Immature Granulocytes: 0.02 10*3/uL (ref 0.00–0.07)
Basophils Absolute: 0 10*3/uL (ref 0.0–0.1)
Basophils Relative: 0 %
Eosinophils Absolute: 0.1 10*3/uL (ref 0.0–0.5)
Eosinophils Relative: 2 %
HCT: 34.1 % — ABNORMAL LOW (ref 39.0–52.0)
Hemoglobin: 11.9 g/dL — ABNORMAL LOW (ref 13.0–17.0)
Immature Granulocytes: 0 %
Lymphocytes Relative: 6 %
Lymphs Abs: 0.4 10*3/uL — ABNORMAL LOW (ref 0.7–4.0)
MCH: 33.7 pg (ref 26.0–34.0)
MCHC: 34.9 g/dL (ref 30.0–36.0)
MCV: 96.6 fL (ref 80.0–100.0)
Monocytes Absolute: 0.6 10*3/uL (ref 0.1–1.0)
Monocytes Relative: 9 %
Neutro Abs: 5.6 10*3/uL (ref 1.7–7.7)
Neutrophils Relative %: 83 %
Platelet Count: 138 10*3/uL — ABNORMAL LOW (ref 150–400)
RBC: 3.53 MIL/uL — ABNORMAL LOW (ref 4.22–5.81)
RDW: 13.2 % (ref 11.5–15.5)
WBC Count: 6.7 10*3/uL (ref 4.0–10.5)
nRBC: 0 % (ref 0.0–0.2)

## 2022-10-29 LAB — CMP (CANCER CENTER ONLY)
ALT: 11 U/L (ref 0–44)
AST: 16 U/L (ref 15–41)
Albumin: 3.9 g/dL (ref 3.5–5.0)
Alkaline Phosphatase: 74 U/L (ref 38–126)
Anion gap: 3 — ABNORMAL LOW (ref 5–15)
BUN: 18 mg/dL (ref 8–23)
CO2: 32 mmol/L (ref 22–32)
Calcium: 9.4 mg/dL (ref 8.9–10.3)
Chloride: 103 mmol/L (ref 98–111)
Creatinine: 0.82 mg/dL (ref 0.61–1.24)
GFR, Estimated: >60 mL/min (ref >60)
Glucose, Bld: 101 mg/dL — ABNORMAL HIGH (ref 70–99)
Potassium: 4.3 mmol/L (ref 3.5–5.1)
Sodium: 138 mmol/L (ref 135–145)
Total Bilirubin: 0.5 mg/dL (ref 0.3–1.2)
Total Protein: 6.8 g/dL (ref 6.5–8.1)

## 2022-10-29 LAB — TSH: TSH: 2.139 u[IU]/mL (ref 0.350–4.500)

## 2022-10-29 MED ORDER — SODIUM CHLORIDE 0.9 % IV SOLN
200.0000 mg | Freq: Once | INTRAVENOUS | Status: AC
  Administered 2022-10-29: 200 mg via INTRAVENOUS
  Filled 2022-10-29: qty 8

## 2022-10-29 MED ORDER — FAMOTIDINE 20 MG PO TABS
20.0000 mg | ORAL_TABLET | Freq: Once | ORAL | Status: AC
  Administered 2022-10-29: 20 mg via ORAL
  Filled 2022-10-29: qty 1

## 2022-10-29 MED ORDER — SODIUM CHLORIDE 0.9% FLUSH
10.0000 mL | Freq: Once | INTRAVENOUS | Status: AC
  Administered 2022-10-29: 10 mL

## 2022-10-29 MED ORDER — SODIUM CHLORIDE 0.9% FLUSH: 10.0000 mL | INTRAVENOUS | Status: DC | PRN

## 2022-10-29 MED ORDER — HEPARIN SOD (PORK) LOCK FLUSH 100 UNIT/ML IV SOLN: 500.0000 [IU] | Freq: Once | INTRAVENOUS | Status: DC | PRN

## 2022-10-29 MED ORDER — SODIUM CHLORIDE 0.9 % IV SOLN: Freq: Once | INTRAVENOUS | Status: AC

## 2022-10-29 MED ORDER — DIPHENHYDRAMINE HCL 25 MG PO CAPS
25.0000 mg | ORAL_CAPSULE | Freq: Once | ORAL | Status: AC
  Administered 2022-10-29: 25 mg via ORAL
  Filled 2022-10-29: qty 1
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
## 2022-10-29 NOTE — Progress Notes (Signed)
Patient seen by MD today  Vitals are within treatment parameters.  Labs reviewed: and are not all within treatment parameters. Plts 138,  Dr [name] aware  Per physician team, patient is ready for treatment and there are NO modifications to the treatment plan.

## 2022-10-29 NOTE — Patient Instructions (Signed)
Mobile  Discharge Instructions: Thank you for choosing [facility] to provide your oncology and hematology care.   If you have a lab appointment with the [facility], please go directly to the [facility] and check in at the registration area.   Wear comfortable clothing and clothing appropriate for easy access to any Portacath or PICC line.   We strive to give you quality time with your provider. You may need to reschedule your appointment if you arrive late (15 or more minutes).  Arriving late affects you and other patients whose appointments are after yours.  Also, if you miss three or more appointments without notifying the office, you may be dismissed from the clinic at the provider's discretion.      For prescription refill requests, have your pharmacy contact our office and allow 72 hours for refills to be completed.    Today you received the following chemotherapy and/or immunotherapy agents : Keytruda      To help prevent nausea and vomiting after your treatment, we encourage you to take your nausea medication as directed.  BELOW ARE SYMPTOMS THAT SHOULD BE REPORTED IMMEDIATELY: *FEVER GREATER THAN 100.4 F (38 C) OR HIGHER *CHILLS OR SWEATING *NAUSEA AND VOMITING THAT IS NOT CONTROLLED WITH YOUR NAUSEA MEDICATION *UNUSUAL SHORTNESS OF BREATH *UNUSUAL BRUISING OR BLEEDING *URINARY PROBLEMS (pain or burning when urinating, or frequent urination) *BOWEL PROBLEMS (unusual diarrhea, constipation, pain near the anus) TENDERNESS IN MOUTH AND THROAT WITH OR WITHOUT PRESENCE OF ULCERS (sore throat, sores in mouth, or a toothache) UNUSUAL RASH, SWELLING OR PAIN  UNUSUAL VAGINAL DISCHARGE OR ITCHING   Items with * indicate a potential emergency and should be followed up as soon as possible or go to the Emergency Department if any problems should occur.  Please show the CHEMOTHERAPY ALERT CARD or IMMUNOTHERAPY ALERT CARD at  check-in to the Emergency Department and triage nurse.  Should you have questions after your visit or need to cancel or reschedule your appointment, please contact [facility]  Dept: [phone]  and follow the prompts.  Office hours are 8:00 a.m. to 4:30 p.m. Monday - Friday. Please note that voicemails left after 4:00 p.m. may not be returned until the following business day.  We are closed weekends and major holidays. You have access to a nurse at all times for urgent questions. Please call the main number to the clinic Dept: [phone] and follow the prompts.   For any non-urgent questions, you may also contact your provider using MyChart. We now offer e-Visits for anyone 24 and older to request care online for non-urgent symptoms. For details visit [email or web address].   Also download the MyChart app! Go to the app store, search "MyChart", open the app, select [facility], and log in with your MyChart username and password.

## 2022-10-31 LAB — T4: T4, Total: 8.7 ug/dL (ref 4.5–12.0)

## 2022-11-04 ENCOUNTER — Encounter: Payer: Self-pay | Admitting: Hematology

## 2022-11-04 ENCOUNTER — Telehealth: Payer: Self-pay | Admitting: Urology

## 2022-11-04 NOTE — Telephone Encounter (Signed)
Left message for patient to call back to schedule consult per 2/21 referral.

## 2022-11-13 ENCOUNTER — Ambulatory Visit
Admission: RE | Admit: 2022-11-13 | Discharge: 2022-11-13 | Disposition: A | Discharge status: Home / Self Care | Payer: No Typology Code available for payment source | Source: Ambulatory Visit | Attending: Urology | Admitting: Urology

## 2022-11-13 ENCOUNTER — Ambulatory Visit
Admission: RE | Admit: 2022-11-13 | Discharge: 2022-11-13 | Disposition: A | Discharge status: Home / Self Care | Payer: No Typology Code available for payment source | Source: Ambulatory Visit | Attending: Radiation Oncology | Admitting: Radiation Oncology

## 2022-11-13 ENCOUNTER — Encounter: Payer: Self-pay | Admitting: Urology

## 2022-11-13 VITALS — BP 110/66 | HR 77 | Temp 97.5°F | Resp 20 | Ht 70.0 in | Wt 122.4 lb

## 2022-11-13 DIAGNOSIS — I10 Essential (primary) hypertension: Secondary | ICD-10-CM | POA: Insufficient documentation

## 2022-11-13 DIAGNOSIS — C7951 Secondary malignant neoplasm of bone: Secondary | ICD-10-CM | POA: Insufficient documentation

## 2022-11-13 DIAGNOSIS — E785 Hyperlipidemia, unspecified: Secondary | ICD-10-CM | POA: Diagnosis not present

## 2022-11-13 DIAGNOSIS — Z79899 Other long term (current) drug therapy: Secondary | ICD-10-CM | POA: Insufficient documentation

## 2022-11-13 DIAGNOSIS — C3412 Malignant neoplasm of upper lobe, left bronchus or lung: Secondary | ICD-10-CM

## 2022-11-13 DIAGNOSIS — K219 Gastro-esophageal reflux disease without esophagitis: Secondary | ICD-10-CM | POA: Diagnosis not present

## 2022-11-13 DIAGNOSIS — I700 Atherosclerosis of aorta: Secondary | ICD-10-CM | POA: Insufficient documentation

## 2022-11-13 DIAGNOSIS — I252 Old myocardial infarction: Secondary | ICD-10-CM | POA: Diagnosis not present

## 2022-11-13 DIAGNOSIS — J432 Centrilobular emphysema: Secondary | ICD-10-CM | POA: Insufficient documentation

## 2022-11-13 DIAGNOSIS — C61 Malignant neoplasm of prostate: Secondary | ICD-10-CM | POA: Insufficient documentation

## 2022-11-13 DIAGNOSIS — Z7902 Long term (current) use of antithrombotics/antiplatelets: Secondary | ICD-10-CM | POA: Diagnosis not present

## 2022-11-13 DIAGNOSIS — I2510 Atherosclerotic heart disease of native coronary artery without angina pectoris: Secondary | ICD-10-CM | POA: Diagnosis not present

## 2022-11-13 DIAGNOSIS — F17210 Nicotine dependence, cigarettes, uncomplicated: Secondary | ICD-10-CM | POA: Diagnosis not present

## 2022-11-13 NOTE — Progress Notes (Signed)
Nursing interview for Malignant neoplasm of LT upper lobe of lung ([place]).  Patient identity verified. Patient reports occasional, mild SOB w/ exertion (mainly when climbing stairs). No other issues at this time.  Meaningful use complete.  Vitals- BP 110/66 (BP Location: Right Arm, Patient Position: Sitting, Cuff Size: Normal)   Pulse 77   Temp (!) 97.5 F (36.4 C) (Temporal)   Resp 20   Ht '5\' 10"'$  (1.778 m)   Wt 122 lb 6.4 oz (55.5 kg)   SpO2 99%   BMI 17.56 kg/m   This concludes the interview.   Leandra Kern, LPN
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
## 2022-11-17 NOTE — Progress Notes (Addendum)
Patient was a RadOnc Consult on 05/15/22 for his stage T2b adenocarcinoma of the prostate with Gleason Score of 4+3, and PSA of 5.35 .  Patient proceed with treatment recommendations of 5.5 weeks of daily radiation and had his final radiation treatment on 09/02/2022.   RN set up patient for follow up with Dr. Junious Silk @ [facility] for 4/22 @ 8:30am.   Pt is aware of appointment time and date.

## 2022-11-18 ENCOUNTER — Ambulatory Visit
Admission: RE | Admit: 2022-11-18 | Discharge: 2022-11-18 | Disposition: A | Discharge status: Home / Self Care | Payer: No Typology Code available for payment source | Source: Ambulatory Visit | Attending: Radiation Oncology | Admitting: Radiation Oncology

## 2022-11-18 DIAGNOSIS — Z510 Encounter for antineoplastic radiation therapy: Secondary | ICD-10-CM | POA: Diagnosis present

## 2022-11-18 DIAGNOSIS — C3412 Malignant neoplasm of upper lobe, left bronchus or lung: Secondary | ICD-10-CM | POA: Diagnosis present

## 2022-11-18 DIAGNOSIS — F17210 Nicotine dependence, cigarettes, uncomplicated: Secondary | ICD-10-CM | POA: Diagnosis not present

## 2022-11-18 NOTE — Progress Notes (Signed)
  Radiation Oncology         ([phone] ________________________________  Name: Kyle Foster MRN: [id number]  Date: 11/18/2022  DOB: 05-14-1946  STEREOTACTIC BODY RADIOTHERAPY SIMULATION AND TREATMENT PLANNING NOTE    ICD-10-CM   1. Malignant neoplasm of upper lobe of left lung (HCC)  C34.12       DIAGNOSIS:  77 year old man with a putative recurrence of his stage IV NSCLC, squamous cell lung cancer, involving a new nodule in the LUL lung.   NARRATIVE:  The patient was brought to the [facility].  Identity was confirmed.  All relevant records and images related to the planned course of therapy were reviewed.  The patient freely provided informed written consent to proceed with treatment after reviewing the details related to the planned course of therapy. The consent form was witnessed and verified by the simulation staff.  Then, the patient was set-up in a stable reproducible  supine position for radiation therapy.  A BodyFix immobilization pillow was fabricated for reproducible positioning.  Then I personally applied the abdominal compression paddle to limit respiratory excursion.  4D respiratoy motion management CT images were obtained.  Surface markings were placed.  The CT images were loaded into the planning software.  Then, using Cine, MIP, and standard views, the internal target volume (ITV) and planning target volumes (PTV) were delinieated, and avoidance structures were contoured.  Treatment planning then occurred.  The radiation prescription was entered and confirmed.  A total of two complex treatment devices were fabricated in the form of the BodyFix immobilization pillow and a neck accuform cushion.  I have requested : 3D Simulation  I have requested a DVH of the following structures: Heart, Lungs, Esophagus, Chest Wall, Brachial Plexus, Major Blood Vessels, and targets.  SPECIAL TREATMENT PROCEDURE:  The planned course of therapy using radiation constitutes a  special treatment procedure. Special care is required in the management of this patient for the following reasons. This treatment constitutes a Special Treatment Procedure for the following reason: [ High dose per fraction requiring special monitoring for increased toxicities of treatment including daily imaging..  The special nature of the planned course of radiotherapy will require increased physician supervision and oversight to ensure patient's safety with optimal treatment outcomes.  This requires extended time and effort.    RESPIRATORY MOTION MANAGEMENT SIMULATION:  In order to account for effect of respiratory motion on target structures and other organs in the planning and delivery of radiotherapy, this patient underwent respiratory motion management simulation.  To accomplish this, when the patient was brought to the CT simulation planning suite, 4D respiratoy motion management CT images were obtained.  The CT images were loaded into the planning software.  Then, using a variety of tools including Cine, MIP, and standard views, the target volume and planning target volumes (PTV) were delineated.  Avoidance structures were contoured.  Treatment planning then occurred.  Dose volume histograms were generated and reviewed for each of the requested structure.  The resulting plan was carefully reviewed and approved today.  PLAN:  The patient will receive 60 Gy in 5 fractions of 12 Gy each.  ________________________________  Sheral Apley. Tammi Klippel, M.D.

## 2022-11-19 ENCOUNTER — Other Ambulatory Visit: Payer: No Typology Code available for payment source

## 2022-11-19 ENCOUNTER — Ambulatory Visit: Payer: No Typology Code available for payment source

## 2022-11-27 ENCOUNTER — Other Ambulatory Visit: Payer: Self-pay

## 2022-11-27 DIAGNOSIS — C3492 Malignant neoplasm of unspecified part of left bronchus or lung: Secondary | ICD-10-CM

## 2022-11-28 ENCOUNTER — Encounter: Payer: Self-pay | Admitting: Hematology

## 2022-11-28 MED ORDER — FENTANYL 50 MCG/HR TD PT72: 1.0000 | MEDICATED_PATCH | TRANSDERMAL | 0 refills | Status: DC

## 2022-12-02 DIAGNOSIS — Z510 Encounter for antineoplastic radiation therapy: Secondary | ICD-10-CM | POA: Diagnosis not present

## 2022-12-03 ENCOUNTER — Other Ambulatory Visit: Payer: Self-pay

## 2022-12-03 ENCOUNTER — Ambulatory Visit
Admission: RE | Admit: 2022-12-03 | Discharge: 2022-12-03 | Disposition: A | Discharge status: Home / Self Care | Payer: No Typology Code available for payment source | Source: Ambulatory Visit | Attending: Radiation Oncology | Admitting: Radiation Oncology

## 2022-12-03 DIAGNOSIS — Z510 Encounter for antineoplastic radiation therapy: Secondary | ICD-10-CM | POA: Diagnosis not present

## 2022-12-03 DIAGNOSIS — C3412 Malignant neoplasm of upper lobe, left bronchus or lung: Secondary | ICD-10-CM

## 2022-12-03 LAB — RAD ONC ARIA SESSION SUMMARY
Course Elapsed Days: 0
Plan Fractions Treated to Date: 1
Plan Prescribed Dose Per Fraction: 12 Gy
Plan Total Fractions Prescribed: 5
Plan Total Prescribed Dose: 60 Gy
Reference Point Dosage Given to Date: 12 Gy
Reference Point Session Dosage Given: 12 Gy
Session Number: 1

## 2022-12-05 ENCOUNTER — Ambulatory Visit
Admission: RE | Admit: 2022-12-05 | Discharge: 2022-12-05 | Disposition: A | Discharge status: Home / Self Care | Payer: No Typology Code available for payment source | Source: Ambulatory Visit | Attending: Radiation Oncology

## 2022-12-05 ENCOUNTER — Ambulatory Visit
Admission: RE | Admit: 2022-12-05 | Discharge: 2022-12-05 | Disposition: A | Discharge status: Home / Self Care | Payer: No Typology Code available for payment source | Source: Ambulatory Visit | Attending: Radiation Oncology | Admitting: Radiation Oncology

## 2022-12-05 ENCOUNTER — Other Ambulatory Visit: Payer: Self-pay

## 2022-12-05 DIAGNOSIS — C3412 Malignant neoplasm of upper lobe, left bronchus or lung: Secondary | ICD-10-CM

## 2022-12-05 DIAGNOSIS — Z510 Encounter for antineoplastic radiation therapy: Secondary | ICD-10-CM | POA: Diagnosis not present

## 2022-12-05 LAB — RAD ONC ARIA SESSION SUMMARY
Course Elapsed Days: 2
Plan Fractions Treated to Date: 2
Plan Prescribed Dose Per Fraction: 12 Gy
Plan Total Fractions Prescribed: 5
Plan Total Prescribed Dose: 60 Gy
Reference Point Dosage Given to Date: 24 Gy
Reference Point Session Dosage Given: 12 Gy
Session Number: 2

## 2022-12-08 ENCOUNTER — Other Ambulatory Visit: Payer: Self-pay

## 2022-12-08 ENCOUNTER — Ambulatory Visit
Admission: RE | Admit: 2022-12-08 | Discharge: 2022-12-08 | Disposition: A | Discharge status: Home / Self Care | Payer: No Typology Code available for payment source | Source: Ambulatory Visit | Attending: Radiation Oncology | Admitting: Radiation Oncology

## 2022-12-08 DIAGNOSIS — C689 Malignant neoplasm of urinary organ, unspecified: Secondary | ICD-10-CM | POA: Insufficient documentation

## 2022-12-08 DIAGNOSIS — C3412 Malignant neoplasm of upper lobe, left bronchus or lung: Secondary | ICD-10-CM | POA: Diagnosis present

## 2022-12-08 DIAGNOSIS — C61 Malignant neoplasm of prostate: Secondary | ICD-10-CM | POA: Insufficient documentation

## 2022-12-08 LAB — RAD ONC ARIA SESSION SUMMARY
Course Elapsed Days: 5
Plan Fractions Treated to Date: 3
Plan Prescribed Dose Per Fraction: 12 Gy
Plan Total Fractions Prescribed: 5
Plan Total Prescribed Dose: 60 Gy
Reference Point Dosage Given to Date: 36 Gy
Reference Point Session Dosage Given: 12 Gy
Session Number: 3

## 2022-12-10 ENCOUNTER — Inpatient Hospital Stay: Payer: No Typology Code available for payment source

## 2022-12-10 ENCOUNTER — Inpatient Hospital Stay: Payer: No Typology Code available for payment source | Attending: Hematology | Admitting: Physician Assistant

## 2022-12-10 ENCOUNTER — Ambulatory Visit
Admission: RE | Admit: 2022-12-10 | Discharge: 2022-12-10 | Disposition: A | Discharge status: Home / Self Care | Payer: No Typology Code available for payment source | Source: Ambulatory Visit | Attending: Radiation Oncology | Admitting: Radiation Oncology

## 2022-12-10 ENCOUNTER — Other Ambulatory Visit: Payer: Self-pay

## 2022-12-10 VITALS — BP 94/54 | HR 69 | Temp 97.2°F | Resp 18 | Wt 119.2 lb

## 2022-12-10 VITALS — BP 110/62 | HR 62 | Temp 98.2°F | Resp 18

## 2022-12-10 DIAGNOSIS — Z95828 Presence of other vascular implants and grafts: Secondary | ICD-10-CM

## 2022-12-10 DIAGNOSIS — Z85818 Personal history of malignant neoplasm of other sites of lip, oral cavity, and pharynx: Secondary | ICD-10-CM | POA: Insufficient documentation

## 2022-12-10 DIAGNOSIS — C61 Malignant neoplasm of prostate: Secondary | ICD-10-CM | POA: Diagnosis not present

## 2022-12-10 DIAGNOSIS — G893 Neoplasm related pain (acute) (chronic): Secondary | ICD-10-CM | POA: Insufficient documentation

## 2022-12-10 DIAGNOSIS — C3492 Malignant neoplasm of unspecified part of left bronchus or lung: Secondary | ICD-10-CM

## 2022-12-10 DIAGNOSIS — C3412 Malignant neoplasm of upper lobe, left bronchus or lung: Secondary | ICD-10-CM

## 2022-12-10 DIAGNOSIS — Z79899 Other long term (current) drug therapy: Secondary | ICD-10-CM | POA: Diagnosis not present

## 2022-12-10 DIAGNOSIS — I959 Hypotension, unspecified: Secondary | ICD-10-CM | POA: Diagnosis not present

## 2022-12-10 DIAGNOSIS — Z7189 Other specified counseling: Secondary | ICD-10-CM

## 2022-12-10 DIAGNOSIS — C7951 Secondary malignant neoplasm of bone: Secondary | ICD-10-CM | POA: Insufficient documentation

## 2022-12-10 DIAGNOSIS — F17210 Nicotine dependence, cigarettes, uncomplicated: Secondary | ICD-10-CM | POA: Insufficient documentation

## 2022-12-10 DIAGNOSIS — E860 Dehydration: Secondary | ICD-10-CM

## 2022-12-10 DIAGNOSIS — Z5112 Encounter for antineoplastic immunotherapy: Secondary | ICD-10-CM | POA: Insufficient documentation

## 2022-12-10 LAB — CBC WITH DIFFERENTIAL (CANCER CENTER ONLY)
Abs Immature Granulocytes: 0.02 10*3/uL (ref 0.00–0.07)
Basophils Absolute: 0 10*3/uL (ref 0.0–0.1)
Basophils Relative: 0 %
Eosinophils Absolute: 0.1 10*3/uL (ref 0.0–0.5)
Eosinophils Relative: 2 %
HCT: 32.3 % — ABNORMAL LOW (ref 39.0–52.0)
Hemoglobin: 11.2 g/dL — ABNORMAL LOW (ref 13.0–17.0)
Immature Granulocytes: 0 %
Lymphocytes Relative: 4 %
Lymphs Abs: 0.3 10*3/uL — ABNORMAL LOW (ref 0.7–4.0)
MCH: 33.3 pg (ref 26.0–34.0)
MCHC: 34.7 g/dL (ref 30.0–36.0)
MCV: 96.1 fL (ref 80.0–100.0)
Monocytes Absolute: 0.5 10*3/uL (ref 0.1–1.0)
Monocytes Relative: 6 %
Neutro Abs: 6.7 10*3/uL (ref 1.7–7.7)
Neutrophils Relative %: 88 %
Platelet Count: 165 10*3/uL (ref 150–400)
RBC: 3.36 MIL/uL — ABNORMAL LOW (ref 4.22–5.81)
RDW: 13.2 % (ref 11.5–15.5)
WBC Count: 7.6 10*3/uL (ref 4.0–10.5)
nRBC: 0 % (ref 0.0–0.2)

## 2022-12-10 LAB — CMP (CANCER CENTER ONLY)
ALT: 16 U/L (ref 0–44)
AST: 19 U/L (ref 15–41)
Albumin: 3.8 g/dL (ref 3.5–5.0)
Alkaline Phosphatase: 74 U/L (ref 38–126)
Anion gap: 4 — ABNORMAL LOW (ref 5–15)
BUN: 27 mg/dL — ABNORMAL HIGH (ref 8–23)
CO2: 30 mmol/L (ref 22–32)
Calcium: 9.5 mg/dL (ref 8.9–10.3)
Chloride: 106 mmol/L (ref 98–111)
Creatinine: 0.78 mg/dL (ref 0.61–1.24)
GFR, Estimated: >60 mL/min (ref >60)
Glucose, Bld: 103 mg/dL — ABNORMAL HIGH (ref 70–99)
Potassium: 4.2 mmol/L (ref 3.5–5.1)
Sodium: 140 mmol/L (ref 135–145)
Total Bilirubin: 0.4 mg/dL (ref 0.3–1.2)
Total Protein: 7 g/dL (ref 6.5–8.1)

## 2022-12-10 LAB — RAD ONC ARIA SESSION SUMMARY
Course Elapsed Days: 7
Plan Fractions Treated to Date: 4
Plan Prescribed Dose Per Fraction: 12 Gy
Plan Total Fractions Prescribed: 5
Plan Total Prescribed Dose: 60 Gy
Reference Point Dosage Given to Date: 48 Gy
Reference Point Session Dosage Given: 12 Gy
Session Number: 4

## 2022-12-10 MED ORDER — SODIUM CHLORIDE 0.9% FLUSH
10.0000 mL | INTRAVENOUS | Status: DC | PRN
  Administered 2022-12-10: 10 mL

## 2022-12-10 MED ORDER — SODIUM CHLORIDE 0.9 % IV SOLN: Freq: Once | INTRAVENOUS | Status: AC

## 2022-12-10 MED ORDER — DIPHENHYDRAMINE HCL 25 MG PO CAPS
25.0000 mg | ORAL_CAPSULE | Freq: Once | ORAL | Status: AC
  Administered 2022-12-10: 25 mg via ORAL
  Filled 2022-12-10: qty 1

## 2022-12-10 MED ORDER — FAMOTIDINE 20 MG PO TABS
20.0000 mg | ORAL_TABLET | Freq: Once | ORAL | Status: AC
  Administered 2022-12-10: 20 mg via ORAL
  Filled 2022-12-10: qty 1

## 2022-12-10 MED ORDER — SODIUM CHLORIDE 0.9 % IV SOLN
200.0000 mg | Freq: Once | INTRAVENOUS | Status: AC
  Administered 2022-12-10: 200 mg via INTRAVENOUS
  Filled 2022-12-10: qty 200
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
## 2022-12-10 NOTE — Patient Instructions (Signed)
[facility]  Discharge Instructions: Thank you for choosing [facility] to provide your oncology and hematology care.   If you have a lab appointment with the Cancer Center, please go directly to the Cancer Center and check in at the registration area.   Wear comfortable clothing and clothing appropriate for easy access to any Portacath or PICC line.   We strive to give you quality time with your provider. You may need to reschedule your appointment if you arrive late (15 or more minutes).  Arriving late affects you and other patients whose appointments are after yours.  Also, if you miss three or more appointments without notifying the office, you may be dismissed from the clinic at the provider's discretion.      For prescription refill requests, have your pharmacy contact our office and allow 72 hours for refills to be completed.    Today you received the following chemotherapy and/or immunotherapy agents: Keytruda      To help prevent nausea and vomiting after your treatment, we encourage you to take your nausea medication as directed.  BELOW ARE SYMPTOMS THAT SHOULD BE REPORTED IMMEDIATELY: *FEVER GREATER THAN 100.4 F (38 C) OR HIGHER *CHILLS OR SWEATING *NAUSEA AND VOMITING THAT IS NOT CONTROLLED WITH YOUR NAUSEA MEDICATION *UNUSUAL SHORTNESS OF BREATH *UNUSUAL BRUISING OR BLEEDING *URINARY PROBLEMS (pain or burning when urinating, or frequent urination) *BOWEL PROBLEMS (unusual diarrhea, constipation, pain near the anus) TENDERNESS IN MOUTH AND THROAT WITH OR WITHOUT PRESENCE OF ULCERS (sore throat, sores in mouth, or a toothache) UNUSUAL RASH, SWELLING OR PAIN  UNUSUAL VAGINAL DISCHARGE OR ITCHING   Items with * indicate a potential emergency and should be followed up as soon as possible or go to the Emergency Department if any problems should occur.  Please show the CHEMOTHERAPY ALERT CARD or IMMUNOTHERAPY ALERT CARD at  check-in to the Emergency Department and triage nurse.  Should you have questions after your visit or need to cancel or reschedule your appointment, please contact [facility]  Dept: [phone]  and follow the prompts.  Office hours are 8:00 a.m. to 4:30 p.m. Monday - Friday. Please note that voicemails left after 4:00 p.m. may not be returned until the following business day.  We are closed weekends and major holidays. You have access to a nurse at all times for urgent questions. Please call the main number to the clinic Dept: [phone] and follow the prompts.   For any non-urgent questions, you may also contact your provider using MyChart. We now offer e-Visits for anyone 18 and older to request care online for non-urgent symptoms. For details visit [email or web address].   Also download the MyChart app! Go to the app store, search "MyChart", open the app, select , and log in with your MyChart username and password.   

## 2022-12-12 ENCOUNTER — Ambulatory Visit
Admission: RE | Admit: 2022-12-12 | Discharge: 2022-12-12 | Disposition: A | Discharge status: Home / Self Care | Payer: No Typology Code available for payment source | Source: Ambulatory Visit | Attending: Radiation Oncology | Admitting: Radiation Oncology

## 2022-12-12 ENCOUNTER — Encounter: Payer: Self-pay | Admitting: Urology

## 2022-12-12 ENCOUNTER — Other Ambulatory Visit: Payer: Self-pay

## 2022-12-12 DIAGNOSIS — C61 Malignant neoplasm of prostate: Secondary | ICD-10-CM | POA: Diagnosis not present

## 2022-12-12 DIAGNOSIS — C689 Malignant neoplasm of urinary organ, unspecified: Secondary | ICD-10-CM

## 2022-12-12 LAB — RAD ONC ARIA SESSION SUMMARY
Course Elapsed Days: 9
Plan Fractions Treated to Date: 5
Plan Prescribed Dose Per Fraction: 12 Gy
Plan Total Fractions Prescribed: 5
Plan Total Prescribed Dose: 60 Gy
Reference Point Dosage Given to Date: 60 Gy
Reference Point Session Dosage Given: 12 Gy
Session Number: 5

## 2022-12-25 ENCOUNTER — Other Ambulatory Visit: Payer: Self-pay

## 2022-12-25 DIAGNOSIS — C3492 Malignant neoplasm of unspecified part of left bronchus or lung: Secondary | ICD-10-CM

## 2022-12-25 MED ORDER — ONDANSETRON HCL 8 MG PO TABS
8.0000 mg | ORAL_TABLET | Freq: Three times a day (TID) | ORAL | 2 refills | Status: DC | PRN
Start: 2022-12-25 — End: 2023-03-26

## 2022-12-26 ENCOUNTER — Encounter: Payer: Self-pay | Admitting: Hematology

## 2022-12-26 MED ORDER — FENTANYL 50 MCG/HR TD PT72
1.0000 | MEDICATED_PATCH | TRANSDERMAL | 0 refills | Status: DC
Start: 2022-12-26 — End: 2023-02-24

## 2022-12-26 NOTE — Progress Notes (Signed)
  Radiation Oncology         [phone] ________________________________  Name: Kyle Foster MRN: [id number]  Date: 12/12/2022  DOB: 09/16/1945  End of Treatment Note  Diagnosis:   77 year old man with a putative recurrence of his stage IV NSCLC, squamous cell lung cancer, involving a new nodule in the LUL lung.      Indication for treatment:  Curative, Definitive SBRT       Radiation treatment dates:   12/03/22 - 12/11/52  Site/dose:   The target in the LUL lung was treated to 60 Gy in 5 fractions of 12 Gy  Beams/energy:   The patient was treated using stereotactic body radiotherapy according to a 3D conformal radiotherapy plan.  Volumetric arc fields were employed to deliver 6 MV X-rays.  Image guidance was performed with per fraction cone beam CT prior to treatment under personal MD supervision.  Immobilization was achieved using BodyFix Pillow.  Narrative: The patient tolerated radiation treatment relatively well with only modest fatigue.  Plan: The patient will receive a call in about one month from the radiation oncology department. He will continue in routine follow up with his medical oncologist, Dr. Candise Che for ongoing monitoring and management of his systemic disease.  ________________________________  Artist Pais. Kathrynn Running, M.D.

## 2022-12-30 ENCOUNTER — Ambulatory Visit
Payer: No Typology Code available for payment source | Attending: Cardiovascular Disease | Admitting: Cardiovascular Disease

## 2022-12-30 ENCOUNTER — Encounter: Payer: Self-pay | Admitting: Cardiovascular Disease

## 2022-12-30 VITALS — BP 110/60 | HR 85 | Ht 70.0 in | Wt 117.2 lb

## 2022-12-30 DIAGNOSIS — I10 Essential (primary) hypertension: Secondary | ICD-10-CM

## 2022-12-30 DIAGNOSIS — I2510 Atherosclerotic heart disease of native coronary artery without angina pectoris: Secondary | ICD-10-CM | POA: Diagnosis not present

## 2022-12-30 DIAGNOSIS — Z9861 Coronary angioplasty status: Secondary | ICD-10-CM

## 2022-12-30 DIAGNOSIS — E782 Mixed hyperlipidemia: Secondary | ICD-10-CM

## 2022-12-30 MED ORDER — METOPROLOL TARTRATE 25 MG PO TABS: 12.5000 mg | ORAL_TABLET | Freq: Two times a day (BID) | ORAL | 3 refills | Status: DC

## 2022-12-30 NOTE — Assessment & Plan Note (Signed)
History of hyperlipidemia on statin therapy with lipid profile performed 03/27/2022 revealing total cholesterol 138, LDL 66 and HDL of 56.

## 2022-12-30 NOTE — Assessment & Plan Note (Signed)
History of essential hypertension a blood pressure measured today at 110/60.  He is on low-dose metoprolol.

## 2022-12-30 NOTE — Assessment & Plan Note (Signed)
History of CAD status post non-STEMI 06/30/2008.  He had stenting of his AV groove circumflex and marginal branch bifurcation with Driver bare-metal stents.  He did have tandem 70% lesions in his RCA normal LV function.  He remains completely asymptomatic.

## 2022-12-30 NOTE — Progress Notes (Signed)
12/30/2022 Kyle Foster   06/23/1946  [id number]  Primary Physician [name], [name], MD Primary Cardiologist: Runell Gess MD FACP, [place], [place], [place]  HPI:  Kyle Foster is a 77 y.o.  fit-appearing married Caucasian male, father of 3 and grandfather of 3 grandchildren, whom I last saw in the office 12/03/2021.Marland Kitchen  He has a history of CAD status post non-ST-segment elevation myocardial infarction June 30, 2008. They catheterized him and stented his AV-groove circumflex and marginal branch bifurcation with Driver bare-metal stents. He did have tandem 70% lesions in his RCA and normal LV function. He has been asymptomatic since. He continues to smoke 5 cigarettes a day. Patient is positive for hypertension and hyperlipidemia.   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  He has had radiation therapy, chemotherapy and now is on immunotherapy.     Since I saw him a year he still gets immunotherapy for his squamous cell lung cancer.  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  He continues to smoke 5 to 10 cigarettes a day.  He denies chest pain or shortness of breath.   Current Meds  Medication Sig   acetaminophen (TYLENOL) 500 MG tablet Take 1,000 mg by mouth every 8 (eight) hours as needed for moderate pain.   albuterol (VENTOLIN HFA) 108 (90 Base) MCG/ACT inhaler Inhale 1 puff into the lungs every 6 (six) hours as needed for wheezing or shortness of breath.   atorvastatin (LIPITOR) 80 MG tablet Take 1 tablet (80 mg total) by mouth daily. (Patient taking differently: Take 80 mg by mouth at bedtime.)   B Complex-C (B-COMPLEX WITH VITAMIN C) tablet Take 1 tablet by mouth daily.   calcium carbonate (TUMS - DOSED IN MG ELEMENTAL CALCIUM) 500 MG chewable tablet Chew 1 tablet (200 mg of elemental calcium total) by mouth 3 (three) times daily with meals. (Patient taking differently: Chew 1 tablet by mouth 3  (three) times daily as needed for indigestion.)   chlorhexidine (PERIDEX) 0.12 % solution SMARTSIG:By Mouth   Cholecalciferol (VITAMIN D) 2000 UNITS tablet Take 2,000 Units by mouth daily.   clopidogrel (PLAVIX) 75 MG tablet Take 1 tablet (75 mg total) by mouth daily. NEEDS APPOINTMENT FOR FUTURE REFILLS   DULoxetine (CYMBALTA) 30 MG capsule TAKE 1 CAPSULE BY MOUTH EVERY DAY   fentaNYL (DURAGESIC) 50 MCG/HR Place 1 patch onto the skin every 3 (three) days.   Fluticasone-Umeclidin-Vilant (TRELEGY ELLIPTA) 100-62.5-25 MCG/ACT AEPB INHALE 1 PUFF BY MOUTH EVERY DAY   Hypromellose (ARTIFICIAL TEARS OP) Place 1 drop into both eyes as needed.   ipratropium-albuterol (DUONEB) 0.5-2.5 (3) MG/3ML SOLN Take 3 mLs by nebulization 4 (four) times daily as needed.   Lactobacillus (PROBIOTIC GOLD EXTRA STRENGTH) CAPS Take 1 capsule by mouth daily.   lidocaine-prilocaine (EMLA) cream Apply 1 application topically as needed (access port).   loratadine (CLARITIN) 10 MG tablet Take 10 mg by mouth daily as needed for allergies.   magnesium oxide (MAG-OX) 400 MG tablet Take 1 tablet (400 mg total) by mouth daily.   metoprolol tartrate (LOPRESSOR) 25 MG tablet TAKE 1 TABLET BY MOUTH TWICE A DAY   morphine (MSIR) 15 MG tablet Take 1-2 tablets (15-30 mg total) by mouth every 4 (four) hours as needed for moderate pain or severe pain.   ondansetron (ZOFRAN) 8 MG tablet Take 1 tablet (8 mg total) by mouth 3 (three) times daily  as needed.   pantoprazole (PROTONIX) 40 MG tablet Take 1 tablet by mouth daily.   Polyvinyl Alcohol (TEARS AGAIN OP) Place 1 drop into the left eye nightly.   tamsulosin (FLOMAX) 0.4 MG CAPS capsule Take 1 capsule (0.4 mg total) by mouth daily after supper.     Allergies  Allergen Reactions   Codeine Other (See Comments)    HEADACHE    Social History   Socioeconomic History   Marital status: Married    Spouse name: Not on file   Number of children: Not on file   Years of education: Not on  file   Highest education level: Not on file  Occupational History   Not on file  Tobacco Use   Smoking status: Every Day    Packs/day: 0.50    Years: 52.00    Additional pack years: 0.00    Total pack years: 26.00    Types: Cigarettes    Start date: 1968   Smokeless tobacco: Never   Tobacco comments:    10/17/22-smokes 5-10 cigarettes a day  Vaping Use   Vaping Use: Never used  Substance and Sexual Activity   Alcohol use: Not Currently   Drug use: Never   Sexual activity: Not on file  Other Topics Concern   Not on file  Social History Narrative   Not on file   Social Determinants of Health   Financial Resource Strain: Low Risk  (10/21/2022)   Overall Financial Resource Strain (CARDIA)    Difficulty of Paying Living Expenses: Not hard at all  Food Insecurity: No Food Insecurity (10/21/2022)   Hunger Vital Sign    Worried About Running Out of Food in the Last Year: Never true    Ran Out of Food in the Last Year: Never true  Transportation Needs: No Transportation Needs (10/21/2022)   PRAPARE - Administrator, Civil Service (Medical): No    Lack of Transportation (Non-Medical): No  Physical Activity: Inactive (10/21/2022)   Exercise Vital Sign    Days of Exercise per Week: 0 days    Minutes of Exercise per Session: 0 min  Stress: No Stress Concern Present (10/21/2022)   [employer] of Occupational Health - Occupational Stress Questionnaire    Feeling of Stress : Not at all  Social Connections: Moderately Isolated (10/21/2022)   Social Connection and Isolation Panel [NHANES]    Frequency of Communication with Friends and Family: More than three times a week    Frequency of Social Gatherings with Friends and Family: More than three times a week    Attends Religious Services: Never    Database administrator or Organizations: No    Attends Banker Meetings: Never    Marital Status: Married  Catering manager Violence: Not At Risk (10/21/2022)    Humiliation, Afraid, Rape, and Kick questionnaire    Fear of Current or Ex-Partner: No    Emotionally Abused: No    Physically Abused: No    Sexually Abused: No     Review of Systems: General: negative for chills, fever, night sweats or weight changes.  Cardiovascular: negative for chest pain, dyspnea on exertion, edema, orthopnea, palpitations, paroxysmal nocturnal dyspnea or shortness of breath Dermatological: negative for rash Respiratory: negative for cough or wheezing Urologic: negative for hematuria Abdominal: negative for nausea, vomiting, diarrhea, bright red blood per rectum, melena, or hematemesis Neurologic: negative for visual changes, syncope, or dizziness All other systems reviewed and are otherwise negative except as noted above.  Blood pressure 110/60, pulse 85, height  (1.778 m), weight 117 lb 3.2 oz (53.2 kg), SpO2 97 %.  General appearance: alert and no distress Neck: no adenopathy, no carotid bruit, no JVD, supple, symmetrical, trachea midline, and thyroid not enlarged, symmetric, no tenderness/mass/nodules Lungs: clear to auscultation bilaterally Heart: regular rate and rhythm, S1, S2 normal, no murmur, click, rub or gallop Extremities: extremities normal, atraumatic, no cyanosis or edema Pulses: 2+ and symmetric Skin: Skin color, texture, turgor normal. No rashes or lesions Neurologic: Grossly normal  EKG sinus rhythm 85 with right bundle branch block.  I personally reviewed this EKG.  ASSESSMENT AND PLAN:   CAD S/P percutaneous coronary angioplasty History of CAD status post non-STEMI 06/30/2008.  He had stenting of his AV groove circumflex and marginal branch bifurcation with Driver bare-metal stents.  He did have tandem 70% lesions in his RCA normal LV function.  He remains completely asymptomatic.  Essential hypertension History of essential hypertension a blood pressure measured today at 110/60.  He is on low-dose metoprolol.  Mixed  hyperlipidemia History of hyperlipidemia on statin therapy with lipid profile performed 03/27/2022 revealing total cholesterol 138, LDL 66 and HDL of 56.     Runell Gess MD FACP,FACC,FAHA, [facility] 12/30/2022 3:13 PM

## 2022-12-30 NOTE — Patient Instructions (Addendum)
Medication Instructions:  Your physician recommends that you continue on your current medications as directed. Please refer to the Current Medication list given to you today.  *If you need a refill on your cardiac medications before your next appointment, please call your pharmacy*   Follow-Up: At [facility], you and your health needs are our priority.  As part of our continuing mission to provide you with exceptional heart care, we have created designated Provider Care Teams.  These Care Teams include your primary Cardiologist (physician) and Advanced Practice Providers (APPs -  Physician Assistants and Nurse Practitioners) who all work together to provide you with the care you need, when you need it.  We recommend signing up for the patient portal called "MyChart".  Sign up information is provided on this After Visit Summary.  MyChart is used to connect with patients for Virtual Visits (Telemedicine).  Patients are able to view lab/test results, encounter notes, upcoming appointments, etc.  Non-urgent messages can be sent to your provider as well.   To learn more about what you can do with MyChart, go to [email or web address].    Your next appointment:   12 month(s)  Provider:   Jonathan Berry, MD    

## 2022-12-31 ENCOUNTER — Inpatient Hospital Stay: Payer: No Typology Code available for payment source

## 2022-12-31 ENCOUNTER — Inpatient Hospital Stay (HOSPITAL_BASED_OUTPATIENT_CLINIC_OR_DEPARTMENT_OTHER): Payer: No Typology Code available for payment source | Admitting: Hematology

## 2022-12-31 ENCOUNTER — Inpatient Hospital Stay: Payer: No Typology Code available for payment source | Admitting: Nutrition

## 2022-12-31 VITALS — BP 105/65 | HR 67 | Temp 97.7°F | Resp 18 | Wt 117.1 lb

## 2022-12-31 DIAGNOSIS — C7951 Secondary malignant neoplasm of bone: Secondary | ICD-10-CM | POA: Diagnosis not present

## 2022-12-31 DIAGNOSIS — Z7189 Other specified counseling: Secondary | ICD-10-CM

## 2022-12-31 DIAGNOSIS — C3492 Malignant neoplasm of unspecified part of left bronchus or lung: Secondary | ICD-10-CM

## 2022-12-31 DIAGNOSIS — C3412 Malignant neoplasm of upper lobe, left bronchus or lung: Secondary | ICD-10-CM

## 2022-12-31 DIAGNOSIS — Z5112 Encounter for antineoplastic immunotherapy: Secondary | ICD-10-CM | POA: Diagnosis not present

## 2022-12-31 DIAGNOSIS — Z95828 Presence of other vascular implants and grafts: Secondary | ICD-10-CM

## 2022-12-31 LAB — CBC WITH DIFFERENTIAL (CANCER CENTER ONLY)
Abs Immature Granulocytes: 0.03 10*3/uL (ref 0.00–0.07)
Basophils Absolute: 0 10*3/uL (ref 0.0–0.1)
Basophils Relative: 0 %
Eosinophils Absolute: 0.2 10*3/uL (ref 0.0–0.5)
Eosinophils Relative: 2 %
HCT: 31.3 % — ABNORMAL LOW (ref 39.0–52.0)
Hemoglobin: 10.7 g/dL — ABNORMAL LOW (ref 13.0–17.0)
Immature Granulocytes: 0 %
Lymphocytes Relative: 4 %
Lymphs Abs: 0.3 10*3/uL — ABNORMAL LOW (ref 0.7–4.0)
MCH: 33 pg (ref 26.0–34.0)
MCHC: 34.2 g/dL (ref 30.0–36.0)
MCV: 96.6 fL (ref 80.0–100.0)
Monocytes Absolute: 0.6 10*3/uL (ref 0.1–1.0)
Monocytes Relative: 8 %
Neutro Abs: 6.3 10*3/uL (ref 1.7–7.7)
Neutrophils Relative %: 86 %
Platelet Count: 175 10*3/uL (ref 150–400)
RBC: 3.24 MIL/uL — ABNORMAL LOW (ref 4.22–5.81)
RDW: 13.4 % (ref 11.5–15.5)
WBC Count: 7.4 10*3/uL (ref 4.0–10.5)
nRBC: 0 % (ref 0.0–0.2)

## 2022-12-31 LAB — CMP (CANCER CENTER ONLY)
ALT: 17 U/L (ref 0–44)
AST: 20 U/L (ref 15–41)
Albumin: 3.7 g/dL (ref 3.5–5.0)
Alkaline Phosphatase: 64 U/L (ref 38–126)
Anion gap: 3 — ABNORMAL LOW (ref 5–15)
BUN: 24 mg/dL — ABNORMAL HIGH (ref 8–23)
CO2: 33 mmol/L — ABNORMAL HIGH (ref 22–32)
Calcium: 9.7 mg/dL (ref 8.9–10.3)
Chloride: 102 mmol/L (ref 98–111)
Creatinine: 0.89 mg/dL (ref 0.61–1.24)
GFR, Estimated: >60 mL/min (ref >60)
Glucose, Bld: 105 mg/dL — ABNORMAL HIGH (ref 70–99)
Potassium: 4.3 mmol/L (ref 3.5–5.1)
Sodium: 138 mmol/L (ref 135–145)
Total Bilirubin: 0.5 mg/dL (ref 0.3–1.2)
Total Protein: 7 g/dL (ref 6.5–8.1)

## 2022-12-31 MED ORDER — SODIUM CHLORIDE 0.9% FLUSH
10.0000 mL | Freq: Once | INTRAVENOUS | Status: AC
  Administered 2022-12-31: 10 mL

## 2022-12-31 MED ORDER — SODIUM CHLORIDE 0.9% FLUSH
10.0000 mL | INTRAVENOUS | Status: DC | PRN
  Administered 2022-12-31: 10 mL

## 2022-12-31 MED ORDER — DIPHENHYDRAMINE HCL 25 MG PO CAPS
25.0000 mg | ORAL_CAPSULE | Freq: Once | ORAL | Status: AC
  Administered 2022-12-31: 25 mg via ORAL
  Filled 2022-12-31: qty 1

## 2022-12-31 MED ORDER — SODIUM CHLORIDE 0.9 % IV SOLN
200.0000 mg | Freq: Once | INTRAVENOUS | Status: AC
  Administered 2022-12-31: 200 mg via INTRAVENOUS
  Filled 2022-12-31: qty 200

## 2022-12-31 MED ORDER — HEPARIN SOD (PORK) LOCK FLUSH 100 UNIT/ML IV SOLN
500.0000 [IU] | Freq: Once | INTRAVENOUS | Status: AC | PRN
  Administered 2022-12-31: 500 [IU]

## 2022-12-31 MED ORDER — SODIUM CHLORIDE 0.9 % IV SOLN: Freq: Once | INTRAVENOUS | Status: AC

## 2022-12-31 MED ORDER — FAMOTIDINE 20 MG PO TABS
20.0000 mg | ORAL_TABLET | Freq: Once | ORAL | Status: AC
  Administered 2022-12-31: 20 mg via ORAL
  Filled 2022-12-31: qty 1

## 2022-12-31 NOTE — Progress Notes (Signed)
.  Patient seen by Dr. Kale  Vitals are within treatment parameters.  Labs reviewed: and are within treatment parameters. CMP still pending  Per physician team, patient is ready for treatment and there are NO modifications to the treatment plan.  
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
## 2022-12-31 NOTE — Patient Instructions (Signed)
[facility]  Discharge Instructions: Thank you for choosing [facility] to provide your oncology and hematology care.   If you have a lab appointment with the Cancer Center, please go directly to the Cancer Center and check in at the registration area.   Wear comfortable clothing and clothing appropriate for easy access to any Portacath or PICC line.   We strive to give you quality time with your provider. You may need to reschedule your appointment if you arrive late (15 or more minutes).  Arriving late affects you and other patients whose appointments are after yours.  Also, if you miss three or more appointments without notifying the office, you may be dismissed from the clinic at the provider's discretion.      For prescription refill requests, have your pharmacy contact our office and allow 72 hours for refills to be completed.    Today you received the following chemotherapy and/or immunotherapy agents: Keytruda      To help prevent nausea and vomiting after your treatment, we encourage you to take your nausea medication as directed.  BELOW ARE SYMPTOMS THAT SHOULD BE REPORTED IMMEDIATELY: *FEVER GREATER THAN 100.4 F (38 C) OR HIGHER *CHILLS OR SWEATING *NAUSEA AND VOMITING THAT IS NOT CONTROLLED WITH YOUR NAUSEA MEDICATION *UNUSUAL SHORTNESS OF BREATH *UNUSUAL BRUISING OR BLEEDING *URINARY PROBLEMS (pain or burning when urinating, or frequent urination) *BOWEL PROBLEMS (unusual diarrhea, constipation, pain near the anus) TENDERNESS IN MOUTH AND THROAT WITH OR WITHOUT PRESENCE OF ULCERS (sore throat, sores in mouth, or a toothache) UNUSUAL RASH, SWELLING OR PAIN  UNUSUAL VAGINAL DISCHARGE OR ITCHING   Items with * indicate a potential emergency and should be followed up as soon as possible or go to the Emergency Department if any problems should occur.  Please show the CHEMOTHERAPY ALERT CARD or IMMUNOTHERAPY ALERT CARD at  check-in to the Emergency Department and triage nurse.  Should you have questions after your visit or need to cancel or reschedule your appointment, please contact [facility]  Dept: [phone]  and follow the prompts.  Office hours are 8:00 a.m. to 4:30 p.m. Monday - Friday. Please note that voicemails left after 4:00 p.m. may not be returned until the following business day.  We are closed weekends and major holidays. You have access to a nurse at all times for urgent questions. Please call the main number to the clinic Dept: [phone] and follow the prompts.   For any non-urgent questions, you may also contact your provider using MyChart. We now offer e-Visits for anyone 18 and older to request care online for non-urgent symptoms. For details visit [email or web address].   Also download the MyChart app! Go to the app store, search "MyChart", open the app, select [facility], and log in with your MyChart username and password.   

## 2023-01-06 ENCOUNTER — Encounter: Payer: Self-pay | Admitting: Hematology

## 2023-01-13 ENCOUNTER — Ambulatory Visit
Admission: RE | Admit: 2023-01-13 | Discharge: 2023-01-13 | Disposition: A | Discharge status: Home / Self Care | Payer: No Typology Code available for payment source | Source: Ambulatory Visit | Attending: Hematology | Admitting: Hematology

## 2023-01-13 NOTE — Progress Notes (Signed)
  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       Indication for treatment:  Curative, Definitive SBRT        Radiation treatment dates:   12/03/22 - 12/11/52   Site/dose:   The target in the LUL lung was treated to 60 Gy in 5 fractions of 12 Gy(as documented in provider EOT note)  The patient was available for call today.   Symptoms of fatigue have improved since completing therapy.  Symptoms of skin changes have improved since completing therapy.  Symptoms of esophagitis have improved since completing therapy.   The patient has scheduled follow up with his medical oncologist Dr. Candise Che for ongoing care, and was encouraged to call if he  develops concerns or questions regarding radiation.  This concludes the interview.   Ruel Favors, LPN

## 2023-01-15 ENCOUNTER — Encounter: Payer: Self-pay | Admitting: Hematology

## 2023-01-21 ENCOUNTER — Inpatient Hospital Stay: Payer: No Typology Code available for payment source

## 2023-01-21 ENCOUNTER — Inpatient Hospital Stay: Payer: No Typology Code available for payment source | Admitting: Hematology

## 2023-01-21 ENCOUNTER — Inpatient Hospital Stay: Payer: No Typology Code available for payment source | Attending: Hematology

## 2023-01-21 DIAGNOSIS — Z95828 Presence of other vascular implants and grafts: Secondary | ICD-10-CM

## 2023-01-21 DIAGNOSIS — Z7189 Other specified counseling: Secondary | ICD-10-CM

## 2023-01-21 DIAGNOSIS — Z8551 Personal history of malignant neoplasm of bladder: Secondary | ICD-10-CM | POA: Insufficient documentation

## 2023-01-21 DIAGNOSIS — Z8546 Personal history of malignant neoplasm of prostate: Secondary | ICD-10-CM | POA: Insufficient documentation

## 2023-01-21 DIAGNOSIS — F17210 Nicotine dependence, cigarettes, uncomplicated: Secondary | ICD-10-CM | POA: Diagnosis not present

## 2023-01-21 DIAGNOSIS — C7951 Secondary malignant neoplasm of bone: Secondary | ICD-10-CM

## 2023-01-21 DIAGNOSIS — Z9221 Personal history of antineoplastic chemotherapy: Secondary | ICD-10-CM | POA: Insufficient documentation

## 2023-01-21 DIAGNOSIS — Z923 Personal history of irradiation: Secondary | ICD-10-CM | POA: Diagnosis not present

## 2023-01-21 DIAGNOSIS — C3412 Malignant neoplasm of upper lobe, left bronchus or lung: Secondary | ICD-10-CM | POA: Diagnosis present

## 2023-01-21 DIAGNOSIS — C3492 Malignant neoplasm of unspecified part of left bronchus or lung: Secondary | ICD-10-CM

## 2023-01-21 DIAGNOSIS — G893 Neoplasm related pain (acute) (chronic): Secondary | ICD-10-CM | POA: Insufficient documentation

## 2023-01-21 DIAGNOSIS — Z5112 Encounter for antineoplastic immunotherapy: Secondary | ICD-10-CM | POA: Diagnosis not present

## 2023-01-21 LAB — CBC WITH DIFFERENTIAL (CANCER CENTER ONLY)
Abs Immature Granulocytes: 0.02 10*3/uL (ref 0.00–0.07)
Basophils Absolute: 0 10*3/uL (ref 0.0–0.1)
Basophils Relative: 0 %
Eosinophils Absolute: 0.2 10*3/uL (ref 0.0–0.5)
Eosinophils Relative: 2 %
HCT: 32.3 % — ABNORMAL LOW (ref 39.0–52.0)
Hemoglobin: 10.9 g/dL — ABNORMAL LOW (ref 13.0–17.0)
Immature Granulocytes: 0 %
Lymphocytes Relative: 4 %
Lymphs Abs: 0.4 10*3/uL — ABNORMAL LOW (ref 0.7–4.0)
MCH: 32.6 pg (ref 26.0–34.0)
MCHC: 33.7 g/dL (ref 30.0–36.0)
MCV: 96.7 fL (ref 80.0–100.0)
Monocytes Absolute: 0.7 10*3/uL (ref 0.1–1.0)
Monocytes Relative: 8 %
Neutro Abs: 7.6 10*3/uL (ref 1.7–7.7)
Neutrophils Relative %: 86 %
Platelet Count: 178 10*3/uL (ref 150–400)
RBC: 3.34 MIL/uL — ABNORMAL LOW (ref 4.22–5.81)
RDW: 14.1 % (ref 11.5–15.5)
WBC Count: 8.9 10*3/uL (ref 4.0–10.5)
nRBC: 0 % (ref 0.0–0.2)

## 2023-01-21 LAB — CMP (CANCER CENTER ONLY)
ALT: 19 U/L (ref 0–44)
AST: 21 U/L (ref 15–41)
Albumin: 3.5 g/dL (ref 3.5–5.0)
Alkaline Phosphatase: 62 U/L (ref 38–126)
Anion gap: 4 — ABNORMAL LOW (ref 5–15)
BUN: 22 mg/dL (ref 8–23)
CO2: 31 mmol/L (ref 22–32)
Calcium: 9.1 mg/dL (ref 8.9–10.3)
Chloride: 103 mmol/L (ref 98–111)
Creatinine: 0.79 mg/dL (ref 0.61–1.24)
GFR, Estimated: >60 mL/min (ref >60)
Glucose, Bld: 104 mg/dL — ABNORMAL HIGH (ref 70–99)
Potassium: 4.3 mmol/L (ref 3.5–5.1)
Sodium: 138 mmol/L (ref 135–145)
Total Bilirubin: 0.4 mg/dL (ref 0.3–1.2)
Total Protein: 6.7 g/dL (ref 6.5–8.1)

## 2023-01-21 LAB — TSH: TSH: 1.558 u[IU]/mL (ref 0.350–4.500)

## 2023-01-21 MED ORDER — HEPARIN SOD (PORK) LOCK FLUSH 100 UNIT/ML IV SOLN
500.0000 [IU] | Freq: Once | INTRAVENOUS | Status: AC | PRN
  Administered 2023-01-21: 500 [IU]

## 2023-01-21 MED ORDER — SODIUM CHLORIDE 0.9 % IV SOLN
200.0000 mg | Freq: Once | INTRAVENOUS | Status: AC
  Administered 2023-01-21: 200 mg via INTRAVENOUS
  Filled 2023-01-21: qty 200

## 2023-01-21 MED ORDER — SODIUM CHLORIDE 0.9 % IV SOLN: Freq: Once | INTRAVENOUS | Status: AC

## 2023-01-21 MED ORDER — FAMOTIDINE 20 MG PO TABS
20.0000 mg | ORAL_TABLET | Freq: Once | ORAL | Status: AC
  Administered 2023-01-21: 20 mg via ORAL
  Filled 2023-01-21: qty 1

## 2023-01-21 MED ORDER — SODIUM CHLORIDE 0.9% FLUSH
10.0000 mL | INTRAVENOUS | Status: DC | PRN
  Administered 2023-01-21: 10 mL

## 2023-01-21 MED ORDER — DIPHENHYDRAMINE HCL 25 MG PO CAPS
25.0000 mg | ORAL_CAPSULE | Freq: Once | ORAL | Status: AC
  Administered 2023-01-21: 25 mg via ORAL
  Filled 2023-01-21: qty 1

## 2023-01-21 NOTE — Patient Instructions (Signed)
[facility]  Discharge Instructions: Thank you for choosing [facility] to provide your oncology and hematology care.   If you have a lab appointment with the Cancer Center, please go directly to the Cancer Center and check in at the registration area.   Wear comfortable clothing and clothing appropriate for easy access to any Portacath or PICC line.   We strive to give you quality time with your provider. You may need to reschedule your appointment if you arrive late (15 or more minutes).  Arriving late affects you and other patients whose appointments are after yours.  Also, if you miss three or more appointments without notifying the office, you may be dismissed from the clinic at the provider's discretion.      For prescription refill requests, have your pharmacy contact our office and allow 72 hours for refills to be completed.    Today you received the following chemotherapy and/or immunotherapy agents: pembrolizumab      To help prevent nausea and vomiting after your treatment, we encourage you to take your nausea medication as directed.  BELOW ARE SYMPTOMS THAT SHOULD BE REPORTED IMMEDIATELY: *FEVER GREATER THAN 100.4 F (38 C) OR HIGHER *CHILLS OR SWEATING *NAUSEA AND VOMITING THAT IS NOT CONTROLLED WITH YOUR NAUSEA MEDICATION *UNUSUAL SHORTNESS OF BREATH *UNUSUAL BRUISING OR BLEEDING *URINARY PROBLEMS (pain or burning when urinating, or frequent urination) *BOWEL PROBLEMS (unusual diarrhea, constipation, pain near the anus) TENDERNESS IN MOUTH AND THROAT WITH OR WITHOUT PRESENCE OF ULCERS (sore throat, sores in mouth, or a toothache) UNUSUAL RASH, SWELLING OR PAIN  UNUSUAL VAGINAL DISCHARGE OR ITCHING   Items with * indicate a potential emergency and should be followed up as soon as possible or go to the Emergency Department if any problems should occur.  Please show the CHEMOTHERAPY ALERT CARD or IMMUNOTHERAPY ALERT CARD at  check-in to the Emergency Department and triage nurse.  Should you have questions after your visit or need to cancel or reschedule your appointment, please contact [facility]  Dept: [phone]  and follow the prompts.  Office hours are 8:00 a.m. to 4:30 p.m. Monday - Friday. Please note that voicemails left after 4:00 p.m. may not be returned until the following business day.  We are closed weekends and major holidays. You have access to a nurse at all times for urgent questions. Please call the main number to the clinic Dept: [phone] and follow the prompts.   For any non-urgent questions, you may also contact your provider using MyChart. We now offer e-Visits for anyone 18 and older to request care online for non-urgent symptoms. For details visit [email or web address].   Also download the MyChart app! Go to the app store, search "MyChart", open the app, select [facility], and log in with your MyChart username and password.   

## 2023-01-22 LAB — T4: T4, Total: 7.9 ug/dL (ref 4.5–12.0)

## 2023-01-26 ENCOUNTER — Ambulatory Visit (INDEPENDENT_AMBULATORY_CARE_PROVIDER_SITE_OTHER): Payer: No Typology Code available for payment source

## 2023-01-26 ENCOUNTER — Ambulatory Visit (INDEPENDENT_AMBULATORY_CARE_PROVIDER_SITE_OTHER): Payer: No Typology Code available for payment source | Admitting: Family Medicine

## 2023-01-26 ENCOUNTER — Encounter (HOSPITAL_BASED_OUTPATIENT_CLINIC_OR_DEPARTMENT_OTHER): Payer: Self-pay | Admitting: Family Medicine

## 2023-01-26 VITALS — BP 103/67 | HR 100 | Ht 70.0 in | Wt 113.8 lb

## 2023-01-26 DIAGNOSIS — R0602 Shortness of breath: Secondary | ICD-10-CM

## 2023-01-26 DIAGNOSIS — J4489 Other specified chronic obstructive pulmonary disease: Secondary | ICD-10-CM

## 2023-01-26 DIAGNOSIS — J439 Emphysema, unspecified: Secondary | ICD-10-CM

## 2023-01-26 NOTE — Assessment & Plan Note (Signed)
Patient reports about 1 month history of shortness of breath.  Indicates that symptoms started shortly after radiation therapy.  Shortness of breath has been noticeable when bending over as well as with increased exertion.  Daughter accompanies patient to the office today and also reports that he has had to stop more frequently when walking over longer distances.  Patient is unsure if this may be related to radiation therapy or other issues such as underlying COPD.  He continues with inhalers as prescribed, has been using short acting rescue inhaler more frequently.  He does have a cough at baseline, thinks that he may have had slight increase in cough from baseline.  Has not had any fever, chills, body aches.  No obvious changes in sputum production. On exam, patient is in no acute distress, vital signs stable.  Lungs with diminished breath sounds throughout, however more diminished on right as compared to left.  Wheezing present diffusely. On review of chart, patient did have labs completed last week which generally remained stable from prior labs, no acute worsening of previously observed anemia, thus do not suspect severe anemia as underlying cause.  Given changes in pulmonary exam, feel that obtaining chest.  This time would be reasonable in order to assess for potential new structural changes.  Additional consideration is COPD exacerbation. At this time, we will proceed with chest x-ray today.  Recommend that patient schedule sooner follow-up visit with pulmonology given worsening symptoms, he will reach out to office to schedule

## 2023-01-26 NOTE — Progress Notes (Signed)
    Procedures performed today:    None.  Independent interpretation of notes and tests performed by another provider:   None.  Brief History, Exam, Impression, and Recommendations:    BP 103/67 (BP Location: Right Arm, Patient Position: Sitting, Cuff Size: Normal)   Pulse 100   Ht 5\' 10"  (1.778 m)   Wt 113 lb 12.8 oz (51.6 kg)   SpO2 98%   BMI 16.33 kg/m   Shortness of breath Patient reports about 1 month history of shortness of breath.  Indicates that symptoms started shortly after radiation therapy.  Shortness of breath has been noticeable when bending over as well as with increased exertion.  Daughter accompanies patient to the office today and also reports that he has had to stop more frequently when walking over longer distances.  Patient is unsure if this may be related to radiation therapy or other issues such as underlying COPD.  He continues with inhalers as prescribed, has been using short acting rescue inhaler more frequently.  He does have a cough at baseline, thinks that he may have had slight increase in cough from baseline.  Has not had any fever, chills, body aches.  No obvious changes in sputum production. On exam, patient is in no acute distress, vital signs stable.  Lungs with diminished breath sounds throughout, however more diminished on right as compared to left.  Wheezing present diffusely. On review of chart, patient did have labs completed last week which generally remained stable from prior labs, no acute worsening of previously observed anemia, thus do not suspect severe anemia as underlying cause.  Given changes in pulmonary exam, feel that obtaining chest.  This time would be reasonable in order to assess for potential new structural changes.  Additional consideration is COPD exacerbation. At this time, we will proceed with chest x-ray today.  Recommend that patient schedule sooner follow-up visit with pulmonology given worsening symptoms, he will reach out to  office to schedule  Return in about 6 months (around 07/29/2023) for chronic medical.   ___________________________________________ Kyle Giampietro [name], MD, ABFM, [facility]

## 2023-01-27 ENCOUNTER — Other Ambulatory Visit (HOSPITAL_BASED_OUTPATIENT_CLINIC_OR_DEPARTMENT_OTHER): Payer: Self-pay | Admitting: Family Medicine

## 2023-01-27 DIAGNOSIS — J4489 Other specified chronic obstructive pulmonary disease: Secondary | ICD-10-CM

## 2023-01-28 ENCOUNTER — Encounter: Payer: Self-pay | Admitting: Hematology

## 2023-01-28 NOTE — Progress Notes (Signed)
This encounter was created in error - please disregard.

## 2023-01-29 ENCOUNTER — Ambulatory Visit: Payer: No Typology Code available for payment source | Admitting: Pulmonary Disease

## 2023-01-29 ENCOUNTER — Encounter: Payer: Self-pay | Admitting: Pulmonary Disease

## 2023-01-29 VITALS — BP 96/56 | HR 64 | Temp 98.2°F | Ht 70.0 in | Wt 112.4 lb

## 2023-01-29 DIAGNOSIS — J4489 Other specified chronic obstructive pulmonary disease: Secondary | ICD-10-CM | POA: Diagnosis not present

## 2023-01-29 DIAGNOSIS — R052 Subacute cough: Secondary | ICD-10-CM | POA: Diagnosis not present

## 2023-01-29 DIAGNOSIS — R0602 Shortness of breath: Secondary | ICD-10-CM

## 2023-01-29 DIAGNOSIS — J439 Emphysema, unspecified: Secondary | ICD-10-CM

## 2023-01-29 DIAGNOSIS — J441 Chronic obstructive pulmonary disease with (acute) exacerbation: Secondary | ICD-10-CM | POA: Diagnosis not present

## 2023-01-29 MED ORDER — AZITHROMYCIN 250 MG PO TABS
ORAL_TABLET | ORAL | 0 refills | Status: DC
Start: 2023-01-29 — End: 2023-04-01

## 2023-01-29 MED ORDER — PREDNISONE 10 MG PO TABS
ORAL_TABLET | ORAL | 0 refills | Status: AC
Start: 2023-01-29 — End: 2023-02-09

## 2023-01-29 NOTE — Patient Instructions (Addendum)
I am concerned you are having an exacerbation of your COPD  Start prednisone taper: 40mg  daily for 3 days 30mg  daily for 3 days 20mg  daily for 3 days 10mg  daily for 3 days  Start Zpak antibiotic for 5 days  Follow up in 3 months, call sooner if needed for earlier appointment
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
## 2023-02-05 ENCOUNTER — Telehealth (HOSPITAL_BASED_OUTPATIENT_CLINIC_OR_DEPARTMENT_OTHER): Payer: Self-pay

## 2023-02-05 NOTE — Telephone Encounter (Signed)
Pt called back in regards to labs. Pt is aware of labs pt viewed them inside mychart.

## 2023-02-10 ENCOUNTER — Other Ambulatory Visit: Payer: Self-pay | Admitting: Cardiovascular Disease

## 2023-02-11 ENCOUNTER — Inpatient Hospital Stay: Payer: No Typology Code available for payment source

## 2023-02-11 ENCOUNTER — Other Ambulatory Visit: Payer: No Typology Code available for payment source

## 2023-02-11 ENCOUNTER — Other Ambulatory Visit: Payer: Self-pay

## 2023-02-11 ENCOUNTER — Ambulatory Visit: Payer: No Typology Code available for payment source | Admitting: Hematology

## 2023-02-11 ENCOUNTER — Inpatient Hospital Stay: Payer: No Typology Code available for payment source | Attending: Hematology

## 2023-02-11 VITALS — BP 110/50 | HR 72 | Temp 98.1°F | Resp 16

## 2023-02-11 DIAGNOSIS — F17210 Nicotine dependence, cigarettes, uncomplicated: Secondary | ICD-10-CM | POA: Diagnosis not present

## 2023-02-11 DIAGNOSIS — Z95828 Presence of other vascular implants and grafts: Secondary | ICD-10-CM

## 2023-02-11 DIAGNOSIS — C7951 Secondary malignant neoplasm of bone: Secondary | ICD-10-CM

## 2023-02-11 DIAGNOSIS — Z7189 Other specified counseling: Secondary | ICD-10-CM

## 2023-02-11 DIAGNOSIS — G893 Neoplasm related pain (acute) (chronic): Secondary | ICD-10-CM | POA: Insufficient documentation

## 2023-02-11 DIAGNOSIS — Z8546 Personal history of malignant neoplasm of prostate: Secondary | ICD-10-CM | POA: Diagnosis not present

## 2023-02-11 DIAGNOSIS — Z923 Personal history of irradiation: Secondary | ICD-10-CM | POA: Diagnosis not present

## 2023-02-11 DIAGNOSIS — C3412 Malignant neoplasm of upper lobe, left bronchus or lung: Secondary | ICD-10-CM

## 2023-02-11 DIAGNOSIS — Z5112 Encounter for antineoplastic immunotherapy: Secondary | ICD-10-CM | POA: Diagnosis present

## 2023-02-11 DIAGNOSIS — C3492 Malignant neoplasm of unspecified part of left bronchus or lung: Secondary | ICD-10-CM

## 2023-02-11 LAB — CBC WITH DIFFERENTIAL (CANCER CENTER ONLY)
Abs Immature Granulocytes: 0.02 10*3/uL (ref 0.00–0.07)
Basophils Absolute: 0 10*3/uL (ref 0.0–0.1)
Basophils Relative: 0 %
Eosinophils Absolute: 0.2 10*3/uL (ref 0.0–0.5)
Eosinophils Relative: 2 %
HCT: 32.9 % — ABNORMAL LOW (ref 39.0–52.0)
Hemoglobin: 11.2 g/dL — ABNORMAL LOW (ref 13.0–17.0)
Immature Granulocytes: 0 %
Lymphocytes Relative: 5 %
Lymphs Abs: 0.4 10*3/uL — ABNORMAL LOW (ref 0.7–4.0)
MCH: 32.7 pg (ref 26.0–34.0)
MCHC: 34 g/dL (ref 30.0–36.0)
MCV: 95.9 fL (ref 80.0–100.0)
Monocytes Absolute: 0.7 10*3/uL (ref 0.1–1.0)
Monocytes Relative: 8 %
Neutro Abs: 7.6 10*3/uL (ref 1.7–7.7)
Neutrophils Relative %: 85 %
Platelet Count: 158 10*3/uL (ref 150–400)
RBC: 3.43 MIL/uL — ABNORMAL LOW (ref 4.22–5.81)
RDW: 15.3 % (ref 11.5–15.5)
WBC Count: 8.9 10*3/uL (ref 4.0–10.5)
nRBC: 0 % (ref 0.0–0.2)

## 2023-02-11 LAB — CMP (CANCER CENTER ONLY)
ALT: 20 U/L (ref 0–44)
AST: 20 U/L (ref 15–41)
Albumin: 3.6 g/dL (ref 3.5–5.0)
Alkaline Phosphatase: 65 U/L (ref 38–126)
Anion gap: 4 — ABNORMAL LOW (ref 5–15)
BUN: 21 mg/dL (ref 8–23)
CO2: 31 mmol/L (ref 22–32)
Calcium: 9.2 mg/dL (ref 8.9–10.3)
Chloride: 103 mmol/L (ref 98–111)
Creatinine: 0.73 mg/dL (ref 0.61–1.24)
GFR, Estimated: >60 mL/min (ref >60)
Glucose, Bld: 114 mg/dL — ABNORMAL HIGH (ref 70–99)
Potassium: 4.2 mmol/L (ref 3.5–5.1)
Sodium: 138 mmol/L (ref 135–145)
Total Bilirubin: 0.5 mg/dL (ref 0.3–1.2)
Total Protein: 6.7 g/dL (ref 6.5–8.1)

## 2023-02-11 MED ORDER — HEPARIN SOD (PORK) LOCK FLUSH 100 UNIT/ML IV SOLN
500.0000 [IU] | Freq: Once | INTRAVENOUS | Status: AC | PRN
  Administered 2023-02-11: 500 [IU]

## 2023-02-11 MED ORDER — SODIUM CHLORIDE 0.9 % IV SOLN: Freq: Once | INTRAVENOUS | Status: AC

## 2023-02-11 MED ORDER — FAMOTIDINE 20 MG PO TABS
20.0000 mg | ORAL_TABLET | Freq: Once | ORAL | Status: AC
  Administered 2023-02-11: 20 mg via ORAL
  Filled 2023-02-11: qty 1

## 2023-02-11 MED ORDER — SODIUM CHLORIDE 0.9 % IV SOLN
200.0000 mg | Freq: Once | INTRAVENOUS | Status: AC
  Administered 2023-02-11: 200 mg via INTRAVENOUS
  Filled 2023-02-11: qty 200

## 2023-02-11 MED ORDER — SODIUM CHLORIDE 0.9% FLUSH
10.0000 mL | INTRAVENOUS | Status: DC | PRN
  Administered 2023-02-11: 10 mL

## 2023-02-11 MED ORDER — SODIUM CHLORIDE 0.9% FLUSH
10.0000 mL | Freq: Once | INTRAVENOUS | Status: AC
  Administered 2023-02-11: 10 mL

## 2023-02-11 MED ORDER — DIPHENHYDRAMINE HCL 25 MG PO CAPS
25.0000 mg | ORAL_CAPSULE | Freq: Once | ORAL | Status: AC
  Administered 2023-02-11: 25 mg via ORAL
  Filled 2023-02-11: qty 1

## 2023-02-11 NOTE — Patient Instructions (Signed)
[facility]  Discharge Instructions: Thank you for choosing [facility] to provide your oncology and hematology care.   If you have a lab appointment with the Cancer Center, please go directly to the Cancer Center and check in at the registration area.   Wear comfortable clothing and clothing appropriate for easy access to any Portacath or PICC line.   We strive to give you quality time with your provider. You may need to reschedule your appointment if you arrive late (15 or more minutes).  Arriving late affects you and other patients whose appointments are after yours.  Also, if you miss three or more appointments without notifying the office, you may be dismissed from the clinic at the provider's discretion.      For prescription refill requests, have your pharmacy contact our office and allow 72 hours for refills to be completed.    Today you received the following chemotherapy and/or immunotherapy agents: pembrolizumab      To help prevent nausea and vomiting after your treatment, we encourage you to take your nausea medication as directed.  BELOW ARE SYMPTOMS THAT SHOULD BE REPORTED IMMEDIATELY: *FEVER GREATER THAN 100.4 F (38 C) OR HIGHER *CHILLS OR SWEATING *NAUSEA AND VOMITING THAT IS NOT CONTROLLED WITH YOUR NAUSEA MEDICATION *UNUSUAL SHORTNESS OF BREATH *UNUSUAL BRUISING OR BLEEDING *URINARY PROBLEMS (pain or burning when urinating, or frequent urination) *BOWEL PROBLEMS (unusual diarrhea, constipation, pain near the anus) TENDERNESS IN MOUTH AND THROAT WITH OR WITHOUT PRESENCE OF ULCERS (sore throat, sores in mouth, or a toothache) UNUSUAL RASH, SWELLING OR PAIN  UNUSUAL VAGINAL DISCHARGE OR ITCHING   Items with * indicate a potential emergency and should be followed up as soon as possible or go to the Emergency Department if any problems should occur.  Please show the CHEMOTHERAPY ALERT CARD or IMMUNOTHERAPY ALERT CARD at  check-in to the Emergency Department and triage nurse.  Should you have questions after your visit or need to cancel or reschedule your appointment, please contact [facility]  Dept: [phone]  and follow the prompts.  Office hours are 8:00 a.m. to 4:30 p.m. Monday - Friday. Please note that voicemails left after 4:00 p.m. may not be returned until the following business day.  We are closed weekends and major holidays. You have access to a nurse at all times for urgent questions. Please call the main number to the clinic Dept: [phone] and follow the prompts.   For any non-urgent questions, you may also contact your provider using MyChart. We now offer e-Visits for anyone 18 and older to request care online for non-urgent symptoms. For details visit [email or web address].   Also download the MyChart app! Go to the app store, search "MyChart", open the app, select [facility], and log in with your MyChart username and password.   

## 2023-02-24 ENCOUNTER — Other Ambulatory Visit: Payer: Self-pay

## 2023-02-24 DIAGNOSIS — C3492 Malignant neoplasm of unspecified part of left bronchus or lung: Secondary | ICD-10-CM

## 2023-02-24 MED ORDER — FENTANYL 50 MCG/HR TD PT72
1.0000 | MEDICATED_PATCH | TRANSDERMAL | 0 refills | Status: DC
Start: 2023-02-24 — End: 2023-03-26

## 2023-03-04 ENCOUNTER — Inpatient Hospital Stay: Payer: No Typology Code available for payment source

## 2023-03-04 ENCOUNTER — Other Ambulatory Visit: Payer: Self-pay

## 2023-03-04 ENCOUNTER — Inpatient Hospital Stay (HOSPITAL_BASED_OUTPATIENT_CLINIC_OR_DEPARTMENT_OTHER): Payer: No Typology Code available for payment source | Admitting: Hematology

## 2023-03-04 VITALS — BP 99/57

## 2023-03-04 VITALS — BP 86/54 | HR 82 | Temp 97.9°F | Resp 18 | Wt 110.1 lb

## 2023-03-04 DIAGNOSIS — C7951 Secondary malignant neoplasm of bone: Secondary | ICD-10-CM

## 2023-03-04 DIAGNOSIS — Z7189 Other specified counseling: Secondary | ICD-10-CM

## 2023-03-04 DIAGNOSIS — C3492 Malignant neoplasm of unspecified part of left bronchus or lung: Secondary | ICD-10-CM

## 2023-03-04 DIAGNOSIS — Z95828 Presence of other vascular implants and grafts: Secondary | ICD-10-CM

## 2023-03-04 DIAGNOSIS — Z5112 Encounter for antineoplastic immunotherapy: Secondary | ICD-10-CM | POA: Diagnosis not present

## 2023-03-04 DIAGNOSIS — C3412 Malignant neoplasm of upper lobe, left bronchus or lung: Secondary | ICD-10-CM

## 2023-03-04 LAB — CBC WITH DIFFERENTIAL (CANCER CENTER ONLY)
Abs Immature Granulocytes: 0.02 10*3/uL (ref 0.00–0.07)
Basophils Absolute: 0 10*3/uL (ref 0.0–0.1)
Basophils Relative: 0 %
Eosinophils Absolute: 0.1 10*3/uL (ref 0.0–0.5)
Eosinophils Relative: 1 %
HCT: 32.3 % — ABNORMAL LOW (ref 39.0–52.0)
Hemoglobin: 10.9 g/dL — ABNORMAL LOW (ref 13.0–17.0)
Immature Granulocytes: 0 %
Lymphocytes Relative: 4 %
Lymphs Abs: 0.4 10*3/uL — ABNORMAL LOW (ref 0.7–4.0)
MCH: 32.5 pg (ref 26.0–34.0)
MCHC: 33.7 g/dL (ref 30.0–36.0)
MCV: 96.4 fL (ref 80.0–100.0)
Monocytes Absolute: 0.8 10*3/uL (ref 0.1–1.0)
Monocytes Relative: 8 %
Neutro Abs: 8.6 10*3/uL — ABNORMAL HIGH (ref 1.7–7.7)
Neutrophils Relative %: 87 %
Platelet Count: 192 10*3/uL (ref 150–400)
RBC: 3.35 MIL/uL — ABNORMAL LOW (ref 4.22–5.81)
RDW: 14.6 % (ref 11.5–15.5)
WBC Count: 9.9 10*3/uL (ref 4.0–10.5)
nRBC: 0 % (ref 0.0–0.2)

## 2023-03-04 LAB — CMP (CANCER CENTER ONLY)
ALT: 16 U/L (ref 0–44)
AST: 17 U/L (ref 15–41)
Albumin: 3.3 g/dL — ABNORMAL LOW (ref 3.5–5.0)
Alkaline Phosphatase: 67 U/L (ref 38–126)
Anion gap: 4 — ABNORMAL LOW (ref 5–15)
BUN: 35 mg/dL — ABNORMAL HIGH (ref 8–23)
CO2: 31 mmol/L (ref 22–32)
Calcium: 9.3 mg/dL (ref 8.9–10.3)
Chloride: 102 mmol/L (ref 98–111)
Creatinine: 0.84 mg/dL (ref 0.61–1.24)
GFR, Estimated: >60 mL/min (ref >60)
Glucose, Bld: 133 mg/dL — ABNORMAL HIGH (ref 70–99)
Potassium: 4.3 mmol/L (ref 3.5–5.1)
Sodium: 137 mmol/L (ref 135–145)
Total Bilirubin: 0.4 mg/dL (ref 0.3–1.2)
Total Protein: 6.8 g/dL (ref 6.5–8.1)

## 2023-03-04 MED ORDER — DIPHENHYDRAMINE HCL 25 MG PO CAPS
25.0000 mg | ORAL_CAPSULE | Freq: Once | ORAL | Status: AC
  Administered 2023-03-04: 25 mg via ORAL
  Filled 2023-03-04: qty 1

## 2023-03-04 MED ORDER — SODIUM CHLORIDE 0.9% FLUSH
10.0000 mL | Freq: Once | INTRAVENOUS | Status: AC
  Administered 2023-03-04: 10 mL

## 2023-03-04 MED ORDER — HEPARIN SOD (PORK) LOCK FLUSH 100 UNIT/ML IV SOLN
500.0000 [IU] | Freq: Once | INTRAVENOUS | Status: AC | PRN
  Administered 2023-03-04: 500 [IU]

## 2023-03-04 MED ORDER — FAMOTIDINE 20 MG PO TABS
20.0000 mg | ORAL_TABLET | Freq: Once | ORAL | Status: AC
  Administered 2023-03-04: 20 mg via ORAL
  Filled 2023-03-04: qty 1

## 2023-03-04 MED ORDER — SODIUM CHLORIDE 0.9 % IV SOLN: Freq: Once | INTRAVENOUS | Status: AC

## 2023-03-04 MED ORDER — SODIUM CHLORIDE 0.9% FLUSH
10.0000 mL | INTRAVENOUS | Status: DC | PRN
  Administered 2023-03-04: 10 mL

## 2023-03-04 MED ORDER — SODIUM CHLORIDE 0.9 % IV SOLN
200.0000 mg | Freq: Once | INTRAVENOUS | Status: AC
  Administered 2023-03-04: 200 mg via INTRAVENOUS
  Filled 2023-03-04: qty 200
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
## 2023-03-04 NOTE — Patient Instructions (Signed)
[facility]  Discharge Instructions: Thank you for choosing [facility] to provide your oncology and hematology care.   If you have a lab appointment with the Cancer Center, please go directly to the Cancer Center and check in at the registration area.   Wear comfortable clothing and clothing appropriate for easy access to any Portacath or PICC line.   We strive to give you quality time with your provider. You may need to reschedule your appointment if you arrive late (15 or more minutes).  Arriving late affects you and other patients whose appointments are after yours.  Also, if you miss three or more appointments without notifying the office, you may be dismissed from the clinic at the provider's discretion.      For prescription refill requests, have your pharmacy contact our office and allow 72 hours for refills to be completed.    Today you received the following chemotherapy and/or immunotherapy agents: pembrolizumab      To help prevent nausea and vomiting after your treatment, we encourage you to take your nausea medication as directed.  BELOW ARE SYMPTOMS THAT SHOULD BE REPORTED IMMEDIATELY: *FEVER GREATER THAN 100.4 F (38 C) OR HIGHER *CHILLS OR SWEATING *NAUSEA AND VOMITING THAT IS NOT CONTROLLED WITH YOUR NAUSEA MEDICATION *UNUSUAL SHORTNESS OF BREATH *UNUSUAL BRUISING OR BLEEDING *URINARY PROBLEMS (pain or burning when urinating, or frequent urination) *BOWEL PROBLEMS (unusual diarrhea, constipation, pain near the anus) TENDERNESS IN MOUTH AND THROAT WITH OR WITHOUT PRESENCE OF ULCERS (sore throat, sores in mouth, or a toothache) UNUSUAL RASH, SWELLING OR PAIN  UNUSUAL VAGINAL DISCHARGE OR ITCHING   Items with * indicate a potential emergency and should be followed up as soon as possible or go to the Emergency Department if any problems should occur.  Please show the CHEMOTHERAPY ALERT CARD or IMMUNOTHERAPY ALERT CARD at  check-in to the Emergency Department and triage nurse.  Should you have questions after your visit or need to cancel or reschedule your appointment, please contact [facility]  Dept: [phone]  and follow the prompts.  Office hours are 8:00 a.m. to 4:30 p.m. Monday - Friday. Please note that voicemails left after 4:00 p.m. may not be returned until the following business day.  We are closed weekends and major holidays. You have access to a nurse at all times for urgent questions. Please call the main number to the clinic Dept: [phone] and follow the prompts.   For any non-urgent questions, you may also contact your provider using MyChart. We now offer e-Visits for anyone 18 and older to request care online for non-urgent symptoms. For details visit [email or web address].   Also download the MyChart app! Go to the app store, search "MyChart", open the app, select [facility], and log in with your MyChart username and password.   

## 2023-03-10 ENCOUNTER — Encounter: Payer: Self-pay | Admitting: Hematology

## 2023-03-23 ENCOUNTER — Encounter (HOSPITAL_COMMUNITY)
Admission: RE | Admit: 2023-03-23 | Discharge: 2023-03-23 | Disposition: A | Discharge status: Home / Self Care | Payer: No Typology Code available for payment source | Source: Ambulatory Visit | Attending: Hematology | Admitting: Hematology

## 2023-03-23 DIAGNOSIS — C7951 Secondary malignant neoplasm of bone: Secondary | ICD-10-CM | POA: Insufficient documentation

## 2023-03-23 DIAGNOSIS — C3492 Malignant neoplasm of unspecified part of left bronchus or lung: Secondary | ICD-10-CM | POA: Insufficient documentation

## 2023-03-23 LAB — GLUCOSE, CAPILLARY: Glucose-Capillary: 86 mg/dL (ref 70–99)

## 2023-03-23 MED ORDER — FLUDEOXYGLUCOSE F - 18 (FDG) INJECTION
5.3000 | Freq: Once | INTRAVENOUS | Status: AC | PRN
  Administered 2023-03-23: 5.5 via INTRAVENOUS

## 2023-03-25 ENCOUNTER — Other Ambulatory Visit: Payer: No Typology Code available for payment source

## 2023-03-25 ENCOUNTER — Inpatient Hospital Stay: Payer: No Typology Code available for payment source | Attending: Hematology

## 2023-03-25 ENCOUNTER — Ambulatory Visit: Payer: No Typology Code available for payment source

## 2023-03-25 ENCOUNTER — Inpatient Hospital Stay: Payer: No Typology Code available for payment source

## 2023-03-25 ENCOUNTER — Inpatient Hospital Stay (HOSPITAL_BASED_OUTPATIENT_CLINIC_OR_DEPARTMENT_OTHER): Payer: No Typology Code available for payment source | Admitting: Physician Assistant

## 2023-03-25 ENCOUNTER — Other Ambulatory Visit: Payer: Self-pay | Admitting: Hematology

## 2023-03-25 ENCOUNTER — Other Ambulatory Visit: Payer: Self-pay

## 2023-03-25 VITALS — BP 99/65 | HR 78 | Resp 16

## 2023-03-25 VITALS — BP 109/55 | HR 73 | Temp 98.2°F | Resp 16 | Wt 110.8 lb

## 2023-03-25 DIAGNOSIS — C3492 Malignant neoplasm of unspecified part of left bronchus or lung: Secondary | ICD-10-CM | POA: Diagnosis not present

## 2023-03-25 DIAGNOSIS — G893 Neoplasm related pain (acute) (chronic): Secondary | ICD-10-CM | POA: Insufficient documentation

## 2023-03-25 DIAGNOSIS — C7951 Secondary malignant neoplasm of bone: Secondary | ICD-10-CM

## 2023-03-25 DIAGNOSIS — Z5112 Encounter for antineoplastic immunotherapy: Secondary | ICD-10-CM | POA: Diagnosis not present

## 2023-03-25 DIAGNOSIS — C3412 Malignant neoplasm of upper lobe, left bronchus or lung: Secondary | ICD-10-CM

## 2023-03-25 DIAGNOSIS — Z79899 Other long term (current) drug therapy: Secondary | ICD-10-CM | POA: Diagnosis not present

## 2023-03-25 DIAGNOSIS — Z95828 Presence of other vascular implants and grafts: Secondary | ICD-10-CM

## 2023-03-25 DIAGNOSIS — Z7189 Other specified counseling: Secondary | ICD-10-CM | POA: Diagnosis not present

## 2023-03-25 DIAGNOSIS — C61 Malignant neoplasm of prostate: Secondary | ICD-10-CM | POA: Insufficient documentation

## 2023-03-25 DIAGNOSIS — F17210 Nicotine dependence, cigarettes, uncomplicated: Secondary | ICD-10-CM | POA: Insufficient documentation

## 2023-03-25 DIAGNOSIS — Z8551 Personal history of malignant neoplasm of bladder: Secondary | ICD-10-CM | POA: Insufficient documentation

## 2023-03-25 DIAGNOSIS — Z9221 Personal history of antineoplastic chemotherapy: Secondary | ICD-10-CM | POA: Diagnosis not present

## 2023-03-25 DIAGNOSIS — Z923 Personal history of irradiation: Secondary | ICD-10-CM | POA: Diagnosis not present

## 2023-03-25 LAB — TSH: TSH: 1.239 u[IU]/mL (ref 0.350–4.500)

## 2023-03-25 LAB — CMP (CANCER CENTER ONLY)
ALT: 17 U/L (ref 0–44)
AST: 18 U/L (ref 15–41)
Albumin: 3.5 g/dL (ref 3.5–5.0)
Alkaline Phosphatase: 69 U/L (ref 38–126)
Anion gap: 5 (ref 5–15)
BUN: 24 mg/dL — ABNORMAL HIGH (ref 8–23)
CO2: 30 mmol/L (ref 22–32)
Calcium: 9.3 mg/dL (ref 8.9–10.3)
Chloride: 104 mmol/L (ref 98–111)
Creatinine: 0.7 mg/dL (ref 0.61–1.24)
GFR, Estimated: >60 mL/min (ref >60)
Glucose, Bld: 91 mg/dL (ref 70–99)
Potassium: 4.5 mmol/L (ref 3.5–5.1)
Sodium: 139 mmol/L (ref 135–145)
Total Bilirubin: 0.3 mg/dL (ref 0.3–1.2)
Total Protein: 6.9 g/dL (ref 6.5–8.1)

## 2023-03-25 LAB — CBC WITH DIFFERENTIAL (CANCER CENTER ONLY)
Abs Immature Granulocytes: 0.05 10*3/uL (ref 0.00–0.07)
Basophils Absolute: 0 10*3/uL (ref 0.0–0.1)
Basophils Relative: 0 %
Eosinophils Absolute: 0.1 10*3/uL (ref 0.0–0.5)
Eosinophils Relative: 1 %
HCT: 31.5 % — ABNORMAL LOW (ref 39.0–52.0)
Hemoglobin: 10.9 g/dL — ABNORMAL LOW (ref 13.0–17.0)
Immature Granulocytes: 1 %
Lymphocytes Relative: 4 %
Lymphs Abs: 0.4 10*3/uL — ABNORMAL LOW (ref 0.7–4.0)
MCH: 33.4 pg (ref 26.0–34.0)
MCHC: 34.6 g/dL (ref 30.0–36.0)
MCV: 96.6 fL (ref 80.0–100.0)
Monocytes Absolute: 0.7 10*3/uL (ref 0.1–1.0)
Monocytes Relative: 7 %
Neutro Abs: 8.6 10*3/uL — ABNORMAL HIGH (ref 1.7–7.7)
Neutrophils Relative %: 87 %
Platelet Count: 175 10*3/uL (ref 150–400)
RBC: 3.26 MIL/uL — ABNORMAL LOW (ref 4.22–5.81)
RDW: 15.1 % (ref 11.5–15.5)
WBC Count: 9.8 10*3/uL (ref 4.0–10.5)
nRBC: 0 % (ref 0.0–0.2)

## 2023-03-25 MED ORDER — SODIUM CHLORIDE 0.9 % IV SOLN: Freq: Once | INTRAVENOUS | Status: AC

## 2023-03-25 MED ORDER — SODIUM CHLORIDE 0.9% FLUSH
10.0000 mL | INTRAVENOUS | Status: DC | PRN
  Administered 2023-03-25: 10 mL

## 2023-03-25 MED ORDER — DIPHENHYDRAMINE HCL 25 MG PO CAPS
25.0000 mg | ORAL_CAPSULE | Freq: Once | ORAL | Status: AC
  Administered 2023-03-25: 25 mg via ORAL
  Filled 2023-03-25: qty 1

## 2023-03-25 MED ORDER — SODIUM CHLORIDE 0.9% FLUSH
10.0000 mL | Freq: Once | INTRAVENOUS | Status: AC
  Administered 2023-03-25: 10 mL

## 2023-03-25 MED ORDER — SODIUM CHLORIDE 0.9 % IV SOLN
200.0000 mg | Freq: Once | INTRAVENOUS | Status: AC
  Administered 2023-03-25: 200 mg via INTRAVENOUS
  Filled 2023-03-25: qty 200

## 2023-03-25 MED ORDER — HEPARIN SOD (PORK) LOCK FLUSH 100 UNIT/ML IV SOLN
500.0000 [IU] | Freq: Once | INTRAVENOUS | Status: AC | PRN
  Administered 2023-03-25: 500 [IU]

## 2023-03-25 MED ORDER — FAMOTIDINE 20 MG PO TABS
20.0000 mg | ORAL_TABLET | Freq: Once | ORAL | Status: AC
  Administered 2023-03-25: 20 mg via ORAL
  Filled 2023-03-25: qty 1

## 2023-03-25 NOTE — Patient Instructions (Signed)
[facility]  Discharge Instructions: Thank you for choosing [facility] to provide your oncology and hematology care.   If you have a lab appointment with the Cancer Center, please go directly to the Cancer Center and check in at the registration area.   Wear comfortable clothing and clothing appropriate for easy access to any Portacath or PICC line.   We strive to give you quality time with your provider. You may need to reschedule your appointment if you arrive late (15 or more minutes).  Arriving late affects you and other patients whose appointments are after yours.  Also, if you miss three or more appointments without notifying the office, you may be dismissed from the clinic at the provider's discretion.      For prescription refill requests, have your pharmacy contact our office and allow 72 hours for refills to be completed.    Today you received the following chemotherapy and/or immunotherapy agents: pembrolizumab      To help prevent nausea and vomiting after your treatment, we encourage you to take your nausea medication as directed.  BELOW ARE SYMPTOMS THAT SHOULD BE REPORTED IMMEDIATELY: *FEVER GREATER THAN 100.4 F (38 C) OR HIGHER *CHILLS OR SWEATING *NAUSEA AND VOMITING THAT IS NOT CONTROLLED WITH YOUR NAUSEA MEDICATION *UNUSUAL SHORTNESS OF BREATH *UNUSUAL BRUISING OR BLEEDING *URINARY PROBLEMS (pain or burning when urinating, or frequent urination) *BOWEL PROBLEMS (unusual diarrhea, constipation, pain near the anus) TENDERNESS IN MOUTH AND THROAT WITH OR WITHOUT PRESENCE OF ULCERS (sore throat, sores in mouth, or a toothache) UNUSUAL RASH, SWELLING OR PAIN  UNUSUAL VAGINAL DISCHARGE OR ITCHING   Items with * indicate a potential emergency and should be followed up as soon as possible or go to the Emergency Department if any problems should occur.  Please show the CHEMOTHERAPY ALERT CARD or IMMUNOTHERAPY ALERT CARD at  check-in to the Emergency Department and triage nurse.  Should you have questions after your visit or need to cancel or reschedule your appointment, please contact [facility]  Dept: [phone]  and follow the prompts.  Office hours are 8:00 a.m. to 4:30 p.m. Monday - Friday. Please note that voicemails left after 4:00 p.m. may not be returned until the following business day.  We are closed weekends and major holidays. You have access to a nurse at all times for urgent questions. Please call the main number to the clinic Dept: [phone] and follow the prompts.   For any non-urgent questions, you may also contact your provider using MyChart. We now offer e-Visits for anyone 18 and older to request care online for non-urgent symptoms. For details visit [email or web address].   Also download the MyChart app! Go to the app store, search "MyChart", open the app, select , and log in with your MyChart username and password.   
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
## 2023-03-26 ENCOUNTER — Encounter: Payer: Self-pay | Admitting: Hematology

## 2023-03-26 MED ORDER — ONDANSETRON HCL 8 MG PO TABS
8.0000 mg | ORAL_TABLET | Freq: Three times a day (TID) | ORAL | 2 refills | Status: DC | PRN
Start: 2023-03-26 — End: 2023-09-11

## 2023-03-26 MED ORDER — FENTANYL 50 MCG/HR TD PT72
1.0000 | MEDICATED_PATCH | TRANSDERMAL | 0 refills | Status: DC
Start: 2023-03-26 — End: 2023-05-07

## 2023-03-27 LAB — T4: T4, Total: 8.4 ug/dL (ref 4.5–12.0)

## 2023-04-01 ENCOUNTER — Ambulatory Visit (HOSPITAL_BASED_OUTPATIENT_CLINIC_OR_DEPARTMENT_OTHER): Payer: No Typology Code available for payment source | Admitting: Family Medicine

## 2023-04-01 ENCOUNTER — Encounter (HOSPITAL_BASED_OUTPATIENT_CLINIC_OR_DEPARTMENT_OTHER): Payer: Self-pay | Admitting: Family Medicine

## 2023-04-01 VITALS — BP 106/70 | HR 76 | Temp 98.1°F | Ht 70.0 in | Wt 109.1 lb

## 2023-04-01 DIAGNOSIS — H6120 Impacted cerumen, unspecified ear: Secondary | ICD-10-CM | POA: Insufficient documentation

## 2023-04-01 DIAGNOSIS — J441 Chronic obstructive pulmonary disease with (acute) exacerbation: Secondary | ICD-10-CM | POA: Diagnosis not present

## 2023-04-01 DIAGNOSIS — H6121 Impacted cerumen, right ear: Secondary | ICD-10-CM | POA: Diagnosis not present

## 2023-04-01 MED ORDER — FLUTICASONE PROPIONATE 50 MCG/ACT NA SUSP: 1.0000 | Freq: Every day | NASAL | 1 refills | Status: DC

## 2023-04-01 MED ORDER — AZITHROMYCIN 250 MG PO TABS
ORAL_TABLET | ORAL | 0 refills | Status: AC
Start: 2023-04-01 — End: 2023-04-06

## 2023-04-01 MED ORDER — PREDNISONE 10 MG PO TABS: ORAL_TABLET | ORAL | 0 refills | Status: AC

## 2023-04-01 NOTE — Addendum Note (Signed)
Addended by: [name], Tashina Credit J on: 04/01/2023 01:31 PM   Modules accepted: Level of Service

## 2023-04-01 NOTE — Assessment & Plan Note (Signed)
Patient presents complaining of right ear discomfort, decreased hearing.  He recalls being in the shower and feeling as though he got water in his ear and then was not able to get the water out despite utilizing drops and other measures.  Patient and family member indicates that used Ciprodex without significant relief. On exam, right external auditory canal with impacted cerumen noted.  No pain with manipulation of pinna Discussed findings on exam, patient amenable to proceeding with ear lavage in office today.  He had lavage completed significant Ear lavage completed successfully with notable debris removed and patient with improvement in symptoms, discomfort resolved and hearing improved.

## 2023-04-01 NOTE — Assessment & Plan Note (Signed)
Patient reports over the past few days he has noted increased shortness of breath, increased cough, change in color to sputum.  He has had sinus congestion, pressure.  No fevers. On exam, patient in no acute distress, vital signs stable.  Cardiovascular exam with regular rate and rhythm, lungs with diffuse expiratory wheezes, decreased air movement, distant breath sounds. By history exam, patient does appear to be dealing with exacerbation of underlying COPD, possible sinus infection currently.  Discussed general measures related to treatment.  Can proceed with steroid taper, azithromycin.  Also recommend use of nasal saline spray, Flonase.  Discussed precautions and recommendations should any worsening occur.

## 2023-04-01 NOTE — Progress Notes (Signed)
    Procedures performed today:    None.  Independent interpretation of notes and tests performed by another provider:   None.  Brief History, Exam, Impression, and Recommendations:    BP 106/70 (BP Location: Right Arm, Patient Position: Sitting, Cuff Size: Normal)   Pulse 76   Temp 98.1 F (36.7 C) (Oral)   Ht 5\' 10"  (1.778 m)   Wt 109 lb 1.6 oz (49.5 kg)   SpO2 98%   BMI 15.65 kg/m   Impacted cerumen of right ear Assessment & Plan: Patient presents complaining of right ear discomfort, decreased hearing.  He recalls being in the shower and feeling as though he got water in his ear and then was not able to get the water out despite utilizing drops and other measures.  Patient and family member indicates that used Ciprodex without significant relief. On exam, right external auditory canal with impacted cerumen noted.  No pain with manipulation of pinna Discussed findings on exam, patient amenable to proceeding with ear lavage in office today.  He had lavage completed significant Ear lavage completed successfully with notable debris removed and patient with improvement in symptoms, discomfort resolved and hearing improved.   COPD with acute exacerbation (HCC) Assessment & Plan: Patient reports over the past few days he has noted increased shortness of breath, increased cough, change in color to sputum.  He has had sinus congestion, pressure.  No fevers. On exam, patient in no acute distress, vital signs stable.  Cardiovascular exam with regular rate and rhythm, lungs with diffuse expiratory wheezes, decreased air movement, distant breath sounds. By history exam, patient does appear to be dealing with exacerbation of underlying COPD, possible sinus infection currently.  Discussed general measures related to treatment.  Can proceed with steroid taper, azithromycin.  Also recommend use of nasal saline spray, Flonase.  Discussed precautions and recommendations should any worsening  occur.  Orders: -     Azithromycin; Take 2 tablets (500 mg total) by mouth daily for 1 day, THEN 1 tablet (250 mg total) daily for 4 days. Take as directed.  Dispense: 6 tablet; Refill: 0  Other orders -     Fluticasone Propionate; Place 1 spray into both nostrils daily.  Dispense: 16 g; Refill: 1 -     predniSONE; Take 4 tablets (40 mg total) by mouth daily with breakfast for 3 days, THEN 3 tablets (30 mg total) daily with breakfast for 3 days, THEN 2 tablets (20 mg total) daily with breakfast for 3 days, THEN 1 tablet (10 mg total) daily with breakfast for 3 days.  Dispense: 30 tablet; Refill: 0  Return if symptoms worsen or fail to improve.   ___________________________________________ Kyle Maziarz [name], MD, ABFM, CAQSM [facility]

## 2023-04-15 ENCOUNTER — Ambulatory Visit: Payer: No Typology Code available for payment source

## 2023-04-15 ENCOUNTER — Other Ambulatory Visit: Payer: No Typology Code available for payment source

## 2023-04-15 ENCOUNTER — Ambulatory Visit: Payer: No Typology Code available for payment source | Admitting: Hematology

## 2023-04-16 ENCOUNTER — Inpatient Hospital Stay (HOSPITAL_BASED_OUTPATIENT_CLINIC_OR_DEPARTMENT_OTHER): Payer: No Typology Code available for payment source | Admitting: Hematology

## 2023-04-16 ENCOUNTER — Inpatient Hospital Stay: Payer: No Typology Code available for payment source

## 2023-04-16 ENCOUNTER — Inpatient Hospital Stay: Payer: No Typology Code available for payment source | Attending: Hematology

## 2023-04-16 ENCOUNTER — Other Ambulatory Visit: Payer: Self-pay

## 2023-04-16 VITALS — BP 108/58 | HR 92 | Temp 98.0°F | Resp 18 | Wt 114.8 lb

## 2023-04-16 DIAGNOSIS — Z7189 Other specified counseling: Secondary | ICD-10-CM

## 2023-04-16 DIAGNOSIS — Z79899 Other long term (current) drug therapy: Secondary | ICD-10-CM | POA: Insufficient documentation

## 2023-04-16 DIAGNOSIS — C7951 Secondary malignant neoplasm of bone: Secondary | ICD-10-CM

## 2023-04-16 DIAGNOSIS — Z9221 Personal history of antineoplastic chemotherapy: Secondary | ICD-10-CM | POA: Insufficient documentation

## 2023-04-16 DIAGNOSIS — Z5112 Encounter for antineoplastic immunotherapy: Secondary | ICD-10-CM | POA: Insufficient documentation

## 2023-04-16 DIAGNOSIS — C3412 Malignant neoplasm of upper lobe, left bronchus or lung: Secondary | ICD-10-CM | POA: Diagnosis present

## 2023-04-16 DIAGNOSIS — Z8551 Personal history of malignant neoplasm of bladder: Secondary | ICD-10-CM | POA: Diagnosis not present

## 2023-04-16 DIAGNOSIS — C3492 Malignant neoplasm of unspecified part of left bronchus or lung: Secondary | ICD-10-CM | POA: Diagnosis not present

## 2023-04-16 DIAGNOSIS — F17210 Nicotine dependence, cigarettes, uncomplicated: Secondary | ICD-10-CM | POA: Insufficient documentation

## 2023-04-16 DIAGNOSIS — G893 Neoplasm related pain (acute) (chronic): Secondary | ICD-10-CM | POA: Insufficient documentation

## 2023-04-16 DIAGNOSIS — Z8546 Personal history of malignant neoplasm of prostate: Secondary | ICD-10-CM | POA: Diagnosis not present

## 2023-04-16 LAB — CMP (CANCER CENTER ONLY)
ALT: 13 U/L (ref 0–44)
AST: 18 U/L (ref 15–41)
Albumin: 3.6 g/dL (ref 3.5–5.0)
Alkaline Phosphatase: 61 U/L (ref 38–126)
Anion gap: 3 — ABNORMAL LOW (ref 5–15)
BUN: 23 mg/dL (ref 8–23)
CO2: 32 mmol/L (ref 22–32)
Calcium: 9 mg/dL (ref 8.9–10.3)
Chloride: 102 mmol/L (ref 98–111)
Creatinine: 0.68 mg/dL (ref 0.61–1.24)
GFR, Estimated: >60 mL/min (ref >60)
Glucose, Bld: 105 mg/dL — ABNORMAL HIGH (ref 70–99)
Potassium: 4.3 mmol/L (ref 3.5–5.1)
Sodium: 137 mmol/L (ref 135–145)
Total Bilirubin: 0.5 mg/dL (ref 0.3–1.2)
Total Protein: 6.7 g/dL (ref 6.5–8.1)

## 2023-04-16 LAB — CBC WITH DIFFERENTIAL (CANCER CENTER ONLY)
Abs Immature Granulocytes: 0.02 10*3/uL (ref 0.00–0.07)
Basophils Absolute: 0 10*3/uL (ref 0.0–0.1)
Basophils Relative: 0 %
Eosinophils Absolute: 0.1 10*3/uL (ref 0.0–0.5)
Eosinophils Relative: 1 %
HCT: 32.4 % — ABNORMAL LOW (ref 39.0–52.0)
Hemoglobin: 10.8 g/dL — ABNORMAL LOW (ref 13.0–17.0)
Immature Granulocytes: 0 %
Lymphocytes Relative: 3 %
Lymphs Abs: 0.3 10*3/uL — ABNORMAL LOW (ref 0.7–4.0)
MCH: 32.8 pg (ref 26.0–34.0)
MCHC: 33.3 g/dL (ref 30.0–36.0)
MCV: 98.5 fL (ref 80.0–100.0)
Monocytes Absolute: 0.7 10*3/uL (ref 0.1–1.0)
Monocytes Relative: 7 %
Neutro Abs: 8.9 10*3/uL — ABNORMAL HIGH (ref 1.7–7.7)
Neutrophils Relative %: 89 %
Platelet Count: 159 10*3/uL (ref 150–400)
RBC: 3.29 MIL/uL — ABNORMAL LOW (ref 4.22–5.81)
RDW: 15.4 % (ref 11.5–15.5)
WBC Count: 10.1 10*3/uL (ref 4.0–10.5)
nRBC: 0 % (ref 0.0–0.2)

## 2023-04-16 MED ORDER — SODIUM CHLORIDE 0.9 % IV SOLN
200.0000 mg | Freq: Once | INTRAVENOUS | Status: AC
  Administered 2023-04-16: 200 mg via INTRAVENOUS
  Filled 2023-04-16: qty 200

## 2023-04-16 MED ORDER — DIPHENHYDRAMINE HCL 25 MG PO CAPS
25.0000 mg | ORAL_CAPSULE | Freq: Once | ORAL | Status: AC
  Administered 2023-04-16: 25 mg via ORAL
  Filled 2023-04-16: qty 1

## 2023-04-16 MED ORDER — SODIUM CHLORIDE 0.9% FLUSH
10.0000 mL | INTRAVENOUS | Status: DC | PRN
  Administered 2023-04-16: 10 mL

## 2023-04-16 MED ORDER — SODIUM CHLORIDE 0.9 % IV SOLN: Freq: Once | INTRAVENOUS | Status: AC

## 2023-04-16 MED ORDER — FAMOTIDINE 20 MG PO TABS
20.0000 mg | ORAL_TABLET | Freq: Once | ORAL | Status: AC
  Administered 2023-04-16: 20 mg via ORAL
  Filled 2023-04-16: qty 1

## 2023-04-16 MED ORDER — HEPARIN SOD (PORK) LOCK FLUSH 100 UNIT/ML IV SOLN
500.0000 [IU] | Freq: Once | INTRAVENOUS | Status: AC | PRN
  Administered 2023-04-16: 500 [IU]

## 2023-04-16 NOTE — Patient Instructions (Signed)
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
## 2023-04-16 NOTE — Progress Notes (Signed)
Patient seen by Dr. Kale  Vitals are within treatment parameters.  Labs reviewed: and are within treatment parameters. CMP pending  Per physician team, patient is ready for treatment and there are NO modifications to the treatment plan.  

## 2023-04-17 ENCOUNTER — Telehealth: Payer: Self-pay | Admitting: Hematology

## 2023-04-17 NOTE — Telephone Encounter (Signed)
Called patient regarding August-October appointments, patient is notified and will be reminded per Mychart.

## 2023-04-20 ENCOUNTER — Encounter: Payer: Self-pay | Admitting: Hematology

## 2023-04-21 ENCOUNTER — Other Ambulatory Visit: Payer: Self-pay | Admitting: Hematology

## 2023-05-06 ENCOUNTER — Inpatient Hospital Stay: Payer: No Typology Code available for payment source

## 2023-05-06 ENCOUNTER — Other Ambulatory Visit: Payer: Self-pay

## 2023-05-06 ENCOUNTER — Other Ambulatory Visit: Payer: No Typology Code available for payment source

## 2023-05-06 ENCOUNTER — Ambulatory Visit: Payer: No Typology Code available for payment source | Admitting: Physician Assistant

## 2023-05-06 VITALS — BP 104/58 | HR 73 | Temp 98.9°F | Resp 18

## 2023-05-06 VITALS — BP 114/49 | HR 61 | Temp 97.7°F | Resp 17 | Wt 111.5 lb

## 2023-05-06 DIAGNOSIS — C7951 Secondary malignant neoplasm of bone: Secondary | ICD-10-CM

## 2023-05-06 DIAGNOSIS — Z95828 Presence of other vascular implants and grafts: Secondary | ICD-10-CM

## 2023-05-06 DIAGNOSIS — Z5112 Encounter for antineoplastic immunotherapy: Secondary | ICD-10-CM | POA: Diagnosis not present

## 2023-05-06 DIAGNOSIS — C3412 Malignant neoplasm of upper lobe, left bronchus or lung: Secondary | ICD-10-CM

## 2023-05-06 DIAGNOSIS — Z7189 Other specified counseling: Secondary | ICD-10-CM

## 2023-05-06 DIAGNOSIS — C3492 Malignant neoplasm of unspecified part of left bronchus or lung: Secondary | ICD-10-CM

## 2023-05-06 LAB — CMP (CANCER CENTER ONLY)
ALT: 15 U/L (ref 0–44)
AST: 19 U/L (ref 15–41)
Albumin: 3.5 g/dL (ref 3.5–5.0)
Alkaline Phosphatase: 60 U/L (ref 38–126)
Anion gap: 4 — ABNORMAL LOW (ref 5–15)
BUN: 19 mg/dL (ref 8–23)
CO2: 32 mmol/L (ref 22–32)
Calcium: 9.1 mg/dL (ref 8.9–10.3)
Chloride: 103 mmol/L (ref 98–111)
Creatinine: 0.72 mg/dL (ref 0.61–1.24)
GFR, Estimated: >60 mL/min (ref >60)
Glucose, Bld: 113 mg/dL — ABNORMAL HIGH (ref 70–99)
Potassium: 4.3 mmol/L (ref 3.5–5.1)
Sodium: 139 mmol/L (ref 135–145)
Total Bilirubin: 0.5 mg/dL (ref 0.3–1.2)
Total Protein: 6.6 g/dL (ref 6.5–8.1)

## 2023-05-06 LAB — CBC WITH DIFFERENTIAL (CANCER CENTER ONLY)
Abs Immature Granulocytes: 0.02 10*3/uL (ref 0.00–0.07)
Basophils Absolute: 0 10*3/uL (ref 0.0–0.1)
Basophils Relative: 0 %
Eosinophils Absolute: 0.1 10*3/uL (ref 0.0–0.5)
Eosinophils Relative: 1 %
HCT: 30.7 % — ABNORMAL LOW (ref 39.0–52.0)
Hemoglobin: 10.6 g/dL — ABNORMAL LOW (ref 13.0–17.0)
Immature Granulocytes: 0 %
Lymphocytes Relative: 4 %
Lymphs Abs: 0.4 10*3/uL — ABNORMAL LOW (ref 0.7–4.0)
MCH: 33.4 pg (ref 26.0–34.0)
MCHC: 34.5 g/dL (ref 30.0–36.0)
MCV: 96.8 fL (ref 80.0–100.0)
Monocytes Absolute: 0.6 10*3/uL (ref 0.1–1.0)
Monocytes Relative: 7 %
Neutro Abs: 8.1 10*3/uL — ABNORMAL HIGH (ref 1.7–7.7)
Neutrophils Relative %: 88 %
Platelet Count: 170 10*3/uL (ref 150–400)
RBC: 3.17 MIL/uL — ABNORMAL LOW (ref 4.22–5.81)
RDW: 14.2 % (ref 11.5–15.5)
WBC Count: 9.2 10*3/uL (ref 4.0–10.5)
nRBC: 0 % (ref 0.0–0.2)

## 2023-05-06 MED ORDER — SODIUM CHLORIDE 0.9% FLUSH
10.0000 mL | INTRAVENOUS | Status: DC | PRN
  Administered 2023-05-06: 10 mL

## 2023-05-06 MED ORDER — SODIUM CHLORIDE 0.9% FLUSH
10.0000 mL | Freq: Once | INTRAVENOUS | Status: AC
  Administered 2023-05-06: 10 mL

## 2023-05-06 MED ORDER — HEPARIN SOD (PORK) LOCK FLUSH 100 UNIT/ML IV SOLN
500.0000 [IU] | Freq: Once | INTRAVENOUS | Status: AC | PRN
  Administered 2023-05-06: 500 [IU]

## 2023-05-06 NOTE — Progress Notes (Signed)
Pt. Here for port flush/lab.  C/O headache, congestion, coughing up thick white phlegm, and nausea.  States nausea medication helps.  Secure chatted Dr. Candise Che and Beth/RN.  Dr. Candise Che states pt. Can see PCP or [facility]/  Pt. Informed.  States he will go see his PCP

## 2023-05-06 NOTE — Progress Notes (Signed)
Patient c/o SOB, coughing, wheezing, head/sinus congestion, sweats and chills but no fever starting on Friday 05/01/23.  VSS.  Per Dr. Candise Che, hold treatment today and have patient see PCP for symptoms.  Patient will keep appt for keytruda in 3 weeks.

## 2023-05-07 ENCOUNTER — Other Ambulatory Visit: Payer: Self-pay

## 2023-05-07 ENCOUNTER — Ambulatory Visit (INDEPENDENT_AMBULATORY_CARE_PROVIDER_SITE_OTHER): Payer: Medicare Other | Admitting: Family Medicine

## 2023-05-07 ENCOUNTER — Encounter (HOSPITAL_BASED_OUTPATIENT_CLINIC_OR_DEPARTMENT_OTHER): Payer: Self-pay | Admitting: Family Medicine

## 2023-05-07 VITALS — BP 96/59 | HR 85 | Ht 70.0 in | Wt 110.5 lb

## 2023-05-07 DIAGNOSIS — J441 Chronic obstructive pulmonary disease with (acute) exacerbation: Secondary | ICD-10-CM

## 2023-05-07 DIAGNOSIS — R52 Pain, unspecified: Secondary | ICD-10-CM | POA: Diagnosis not present

## 2023-05-07 DIAGNOSIS — C3492 Malignant neoplasm of unspecified part of left bronchus or lung: Secondary | ICD-10-CM

## 2023-05-07 LAB — POC COVID19 BINAXNOW: SARS Coronavirus 2 Ag: NEGATIVE

## 2023-05-07 MED ORDER — AZITHROMYCIN 250 MG PO TABS: ORAL_TABLET | ORAL | 0 refills | Status: AC

## 2023-05-07 MED ORDER — PREDNISONE 10 MG PO TABS: ORAL_TABLET | ORAL | 0 refills | Status: AC

## 2023-05-07 NOTE — Progress Notes (Signed)
    Procedures performed today:    None.  Independent interpretation of notes and tests performed by another provider:   None.  Brief History, Exam, Impression, and Recommendations:    BP (!) 96/59   Pulse 85   Ht 5\' 10"  (1.778 m)   Wt 110 lb 8 oz (50.1 kg)   SpO2 99%   BMI 15.86 kg/m   Body aches -     POC COVID-19 BinaxNow  COPD with acute exacerbation (HCC) Assessment & Plan: Patient reports that about 6 days ago, he began to have cough, sweats, headache.  He is also experienced chills, denies any fevers.  Patient does have history of COPD.  He does feel the cough has been slightly more productive compared to usual.  Not necessarily aware of any specific sick contacts.  Has been utilizing OTC measures to help with controlling symptoms. On exam, patient is in no acute distress, vital signs stable.  Normal oxygenation.  Cardiovascular exam with regular rate and rhythm, lungs with distant breath sounds, expiratory wheezing appreciated. Given symptoms, can proceed with management as COPD exacerbation.  We will proceed with azithromycin, steroid taper.  In office COVID test was also completed which was negative which is reassuring.   Other orders -     Azithromycin; Take 2 tablets on day 1, then 1 tablet daily on days 2 through 5  Dispense: 6 tablet; Refill: 0 -     predniSONE; Take 4 tablets (40 mg total) by mouth daily with breakfast for 3 days, THEN 3 tablets (30 mg total) daily with breakfast for 3 days, THEN 2 tablets (20 mg total) daily with breakfast for 3 days, THEN 1 tablet (10 mg total) daily with breakfast for 3 days.  Dispense: 30 tablet; Refill: 0  Return if symptoms worsen or fail to improve.   ___________________________________________ Adriana Quinby [name], MD, ABFM, CAQSM Primary Care and Sports Medicine [facility]

## 2023-05-09 ENCOUNTER — Encounter: Payer: Self-pay | Admitting: Hematology

## 2023-05-09 MED ORDER — FENTANYL 50 MCG/HR TD PT72
1.0000 | MEDICATED_PATCH | TRANSDERMAL | 0 refills | Status: AC
Start: 2023-05-09 — End: ?

## 2023-05-19 ENCOUNTER — Other Ambulatory Visit (HOSPITAL_BASED_OUTPATIENT_CLINIC_OR_DEPARTMENT_OTHER): Payer: Self-pay | Admitting: Family Medicine

## 2023-05-19 DIAGNOSIS — J439 Emphysema, unspecified: Secondary | ICD-10-CM

## 2023-05-21 ENCOUNTER — Ambulatory Visit (HOSPITAL_COMMUNITY)
Admission: RE | Admit: 2023-05-21 | Discharge: 2023-05-21 | Disposition: A | Discharge status: Home / Self Care | Payer: No Typology Code available for payment source | Source: Ambulatory Visit | Attending: Hematology | Admitting: Hematology

## 2023-05-21 DIAGNOSIS — C3492 Malignant neoplasm of unspecified part of left bronchus or lung: Secondary | ICD-10-CM | POA: Diagnosis present

## 2023-05-27 ENCOUNTER — Inpatient Hospital Stay: Payer: No Typology Code available for payment source

## 2023-05-27 ENCOUNTER — Inpatient Hospital Stay: Payer: No Typology Code available for payment source | Attending: Hematology

## 2023-05-27 ENCOUNTER — Inpatient Hospital Stay: Payer: No Typology Code available for payment source | Admitting: Hematology

## 2023-05-27 VITALS — BP 103/57 | HR 81 | Temp 98.3°F | Resp 16

## 2023-05-27 DIAGNOSIS — C7951 Secondary malignant neoplasm of bone: Secondary | ICD-10-CM

## 2023-05-27 DIAGNOSIS — Z7189 Other specified counseling: Secondary | ICD-10-CM

## 2023-05-27 DIAGNOSIS — C61 Malignant neoplasm of prostate: Secondary | ICD-10-CM | POA: Diagnosis not present

## 2023-05-27 DIAGNOSIS — Z95828 Presence of other vascular implants and grafts: Secondary | ICD-10-CM

## 2023-05-27 DIAGNOSIS — Z5112 Encounter for antineoplastic immunotherapy: Secondary | ICD-10-CM | POA: Diagnosis present

## 2023-05-27 DIAGNOSIS — Z86711 Personal history of pulmonary embolism: Secondary | ICD-10-CM | POA: Insufficient documentation

## 2023-05-27 DIAGNOSIS — C3492 Malignant neoplasm of unspecified part of left bronchus or lung: Secondary | ICD-10-CM

## 2023-05-27 DIAGNOSIS — Z923 Personal history of irradiation: Secondary | ICD-10-CM | POA: Diagnosis not present

## 2023-05-27 DIAGNOSIS — C3412 Malignant neoplasm of upper lobe, left bronchus or lung: Secondary | ICD-10-CM | POA: Diagnosis present

## 2023-05-27 DIAGNOSIS — F17210 Nicotine dependence, cigarettes, uncomplicated: Secondary | ICD-10-CM | POA: Diagnosis not present

## 2023-05-27 LAB — CBC WITH DIFFERENTIAL (CANCER CENTER ONLY)
Abs Immature Granulocytes: 0.03 10*3/uL (ref 0.00–0.07)
Basophils Absolute: 0 10*3/uL (ref 0.0–0.1)
Basophils Relative: 0 %
Eosinophils Absolute: 0.1 10*3/uL (ref 0.0–0.5)
Eosinophils Relative: 1 %
HCT: 32.1 % — ABNORMAL LOW (ref 39.0–52.0)
Hemoglobin: 10.8 g/dL — ABNORMAL LOW (ref 13.0–17.0)
Immature Granulocytes: 0 %
Lymphocytes Relative: 3 %
Lymphs Abs: 0.3 10*3/uL — ABNORMAL LOW (ref 0.7–4.0)
MCH: 32.9 pg (ref 26.0–34.0)
MCHC: 33.6 g/dL (ref 30.0–36.0)
MCV: 97.9 fL (ref 80.0–100.0)
Monocytes Absolute: 0.7 10*3/uL (ref 0.1–1.0)
Monocytes Relative: 7 %
Neutro Abs: 9.1 10*3/uL — ABNORMAL HIGH (ref 1.7–7.7)
Neutrophils Relative %: 89 %
Platelet Count: 180 10*3/uL (ref 150–400)
RBC: 3.28 MIL/uL — ABNORMAL LOW (ref 4.22–5.81)
RDW: 14.5 % (ref 11.5–15.5)
WBC Count: 10.2 10*3/uL (ref 4.0–10.5)
nRBC: 0 % (ref 0.0–0.2)

## 2023-05-27 LAB — CMP (CANCER CENTER ONLY)
ALT: 14 U/L (ref 0–44)
AST: 17 U/L (ref 15–41)
Albumin: 3.5 g/dL (ref 3.5–5.0)
Alkaline Phosphatase: 56 U/L (ref 38–126)
Anion gap: 4 — ABNORMAL LOW (ref 5–15)
BUN: 23 mg/dL (ref 8–23)
CO2: 30 mmol/L (ref 22–32)
Calcium: 9.1 mg/dL (ref 8.9–10.3)
Chloride: 102 mmol/L (ref 98–111)
Creatinine: 0.8 mg/dL (ref 0.61–1.24)
GFR, Estimated: >60 mL/min (ref >60)
Glucose, Bld: 123 mg/dL — ABNORMAL HIGH (ref 70–99)
Potassium: 4.3 mmol/L (ref 3.5–5.1)
Sodium: 136 mmol/L (ref 135–145)
Total Bilirubin: 0.4 mg/dL (ref 0.3–1.2)
Total Protein: 6.8 g/dL (ref 6.5–8.1)

## 2023-05-27 MED ORDER — SODIUM CHLORIDE 0.9 % IV SOLN: Freq: Once | INTRAVENOUS | Status: AC

## 2023-05-27 MED ORDER — SODIUM CHLORIDE 0.9% FLUSH
10.0000 mL | INTRAVENOUS | Status: DC | PRN
  Administered 2023-05-27: 10 mL

## 2023-05-27 MED ORDER — SODIUM CHLORIDE 0.9 % IV SOLN
200.0000 mg | Freq: Once | INTRAVENOUS | Status: AC
  Administered 2023-05-27: 200 mg via INTRAVENOUS
  Filled 2023-05-27: qty 200

## 2023-05-27 MED ORDER — SODIUM CHLORIDE 0.9% FLUSH
10.0000 mL | Freq: Once | INTRAVENOUS | Status: AC
  Administered 2023-05-27: 10 mL

## 2023-05-27 MED ORDER — DIPHENHYDRAMINE HCL 25 MG PO CAPS
25.0000 mg | ORAL_CAPSULE | Freq: Once | ORAL | Status: AC
  Administered 2023-05-27: 25 mg via ORAL
  Filled 2023-05-27: qty 1

## 2023-05-27 MED ORDER — FAMOTIDINE 20 MG PO TABS
20.0000 mg | ORAL_TABLET | Freq: Once | ORAL | Status: AC
  Administered 2023-05-27: 20 mg via ORAL
  Filled 2023-05-27: qty 1

## 2023-05-27 MED ORDER — HEPARIN SOD (PORK) LOCK FLUSH 100 UNIT/ML IV SOLN
500.0000 [IU] | Freq: Once | INTRAVENOUS | Status: AC | PRN
  Administered 2023-05-27: 500 [IU]

## 2023-05-27 NOTE — Progress Notes (Signed)
Patient seen by Dr. Addison Naegeli are within treatment parameters.  Labs reviewed: and are within treatment parameters.  Per physician team, patient is ready for treatment and there are NO modifications to the treatment plan.
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
## 2023-05-27 NOTE — Patient Instructions (Signed)
Kyle Foster  Discharge Instructions: Thank you for choosing [facility] to provide your oncology and hematology care.   If you have a lab appointment with the [facility], please go directly to the [facility] and check in at the registration area.   Wear comfortable clothing and clothing appropriate for easy access to any Portacath or PICC line.   We strive to give you quality time with your provider. You may need to reschedule your appointment if you arrive late (15 or more minutes).  Arriving late affects you and other patients whose appointments are after yours.  Also, if you miss three or more appointments without notifying the office, you may be dismissed from the clinic at the provider's discretion.      For prescription refill requests, have your pharmacy contact our office and allow 72 hours for refills to be completed.    Today you received the following chemotherapy and/or immunotherapy agents: Keytruda      To help prevent nausea and vomiting after your treatment, we encourage you to take your nausea medication as directed.  BELOW ARE SYMPTOMS THAT SHOULD BE REPORTED IMMEDIATELY: *FEVER GREATER THAN 100.4 F (38 C) OR HIGHER *CHILLS OR SWEATING *NAUSEA AND VOMITING THAT IS NOT CONTROLLED WITH YOUR NAUSEA MEDICATION *UNUSUAL SHORTNESS OF BREATH *UNUSUAL BRUISING OR BLEEDING *URINARY PROBLEMS (pain or burning when urinating, or frequent urination) *BOWEL PROBLEMS (unusual diarrhea, constipation, pain near the anus) TENDERNESS IN MOUTH AND THROAT WITH OR WITHOUT PRESENCE OF ULCERS (sore throat, sores in mouth, or a toothache) UNUSUAL RASH, SWELLING OR PAIN  UNUSUAL VAGINAL DISCHARGE OR ITCHING   Items with * indicate a potential emergency and should be followed up as soon as possible or go to the Emergency Department if any problems should occur.  Please show the CHEMOTHERAPY ALERT CARD or IMMUNOTHERAPY ALERT CARD at  check-in to the Emergency Department and triage nurse.  Should you have questions after your visit or need to cancel or reschedule your appointment, please contact [facility]  Dept: [phone]  and follow the prompts.  Office hours are 8:00 a.m. to 4:30 p.m. Monday - Friday. Please note that voicemails left after 4:00 p.m. may not be returned until the following business day.  We are closed weekends and major holidays. You have access to a nurse at all times for urgent questions. Please call the main number to the clinic Dept: [phone] and follow the prompts.   For any non-urgent questions, you may also contact your provider using MyChart. We now offer e-Visits for anyone 14 and older to request care online for non-urgent symptoms. For details visit [email or web address].   Also download the MyChart app! Go to the app store, search "MyChart", open the app, select [facility], and log in with your MyChart username and password.

## 2023-05-28 LAB — TSH: TSH: 2.062 u[IU]/mL (ref 0.350–4.500)

## 2023-05-28 LAB — T4: T4, Total: 8.4 ug/dL (ref 4.5–12.0)

## 2023-06-01 ENCOUNTER — Encounter: Payer: Self-pay | Admitting: Hematology

## 2023-06-01 ENCOUNTER — Ambulatory Visit: Payer: Medicare Other | Admitting: Pulmonary Disease

## 2023-06-01 ENCOUNTER — Encounter: Payer: Self-pay | Admitting: Pulmonary Disease

## 2023-06-01 VITALS — BP 96/62 | HR 83 | Temp 98.2°F | Ht 70.0 in | Wt 112.2 lb

## 2023-06-01 DIAGNOSIS — J441 Chronic obstructive pulmonary disease with (acute) exacerbation: Secondary | ICD-10-CM

## 2023-06-01 MED ORDER — BREZTRI AEROSPHERE 160-9-4.8 MCG/ACT IN AERO: 2.0000 | INHALATION_SPRAY | Freq: Two times a day (BID) | RESPIRATORY_TRACT | Status: DC

## 2023-06-01 MED ORDER — AMOXICILLIN-POT CLAVULANATE 875-125 MG PO TABS
1.0000 | ORAL_TABLET | Freq: Two times a day (BID) | ORAL | 0 refills | Status: DC
Start: 2023-06-01 — End: 2023-08-03

## 2023-06-01 MED ORDER — PREDNISONE 10 MG PO TABS
ORAL_TABLET | ORAL | 0 refills | Status: AC
Start: 2023-06-01 — End: 2023-06-12
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
## 2023-06-01 NOTE — Patient Instructions (Addendum)
Start steroid taper  Start augmentin antibiotic 1 pill twice daily for 10 days  Try Breztri inhaler 2 puffs twice daily - rinse mouth out after each use  Let us know if you want to continue with Breztri and we will send in a prescription  Stop Trelegy while taking the Breztri inhaler  Use duoneb nebnulizer treatment as needed  Follow up in 6 months

## 2023-06-08 ENCOUNTER — Encounter: Payer: Self-pay | Admitting: Pulmonary Disease

## 2023-06-08 ENCOUNTER — Other Ambulatory Visit: Payer: Self-pay

## 2023-06-08 DIAGNOSIS — C3492 Malignant neoplasm of unspecified part of left bronchus or lung: Secondary | ICD-10-CM

## 2023-06-08 NOTE — Assessment & Plan Note (Signed)
Patient reports that about 6 days ago, he began to have cough, sweats, headache.  He is also experienced chills, denies any fevers.  Patient does have history of COPD.  He does feel the cough has been slightly more productive compared to usual.  Not necessarily aware of any specific sick contacts.  Has been utilizing OTC measures to help with controlling symptoms. On exam, patient is in no acute distress, vital signs stable.  Normal oxygenation.  Cardiovascular exam with regular rate and rhythm, lungs with distant breath sounds, expiratory wheezing appreciated. Given symptoms, can proceed with management as COPD exacerbation.  We will proceed with azithromycin, steroid taper.  In office COVID test was also completed which was negative which is reassuring.

## 2023-06-09 ENCOUNTER — Encounter: Payer: Self-pay | Admitting: Hematology

## 2023-06-09 MED ORDER — FENTANYL 50 MCG/HR TD PT72: 1.0000 | MEDICATED_PATCH | TRANSDERMAL | 0 refills | Status: DC

## 2023-06-10 ENCOUNTER — Telehealth: Payer: No Typology Code available for payment source | Admitting: Hematology

## 2023-06-17 ENCOUNTER — Inpatient Hospital Stay: Payer: No Typology Code available for payment source

## 2023-06-17 ENCOUNTER — Inpatient Hospital Stay: Payer: No Typology Code available for payment source | Admitting: Hematology

## 2023-06-17 ENCOUNTER — Inpatient Hospital Stay: Payer: No Typology Code available for payment source | Attending: Hematology

## 2023-06-17 DIAGNOSIS — C3492 Malignant neoplasm of unspecified part of left bronchus or lung: Secondary | ICD-10-CM

## 2023-06-17 DIAGNOSIS — C61 Malignant neoplasm of prostate: Secondary | ICD-10-CM | POA: Insufficient documentation

## 2023-06-17 DIAGNOSIS — G893 Neoplasm related pain (acute) (chronic): Secondary | ICD-10-CM | POA: Diagnosis not present

## 2023-06-17 DIAGNOSIS — F17210 Nicotine dependence, cigarettes, uncomplicated: Secondary | ICD-10-CM | POA: Diagnosis not present

## 2023-06-17 DIAGNOSIS — Z7189 Other specified counseling: Secondary | ICD-10-CM

## 2023-06-17 DIAGNOSIS — C7951 Secondary malignant neoplasm of bone: Secondary | ICD-10-CM | POA: Diagnosis not present

## 2023-06-17 DIAGNOSIS — Z923 Personal history of irradiation: Secondary | ICD-10-CM | POA: Insufficient documentation

## 2023-06-17 DIAGNOSIS — Z9221 Personal history of antineoplastic chemotherapy: Secondary | ICD-10-CM | POA: Insufficient documentation

## 2023-06-17 DIAGNOSIS — Z5112 Encounter for antineoplastic immunotherapy: Secondary | ICD-10-CM | POA: Insufficient documentation

## 2023-06-17 DIAGNOSIS — Z8551 Personal history of malignant neoplasm of bladder: Secondary | ICD-10-CM | POA: Diagnosis not present

## 2023-06-17 DIAGNOSIS — Z95828 Presence of other vascular implants and grafts: Secondary | ICD-10-CM

## 2023-06-17 DIAGNOSIS — C3412 Malignant neoplasm of upper lobe, left bronchus or lung: Secondary | ICD-10-CM

## 2023-06-17 LAB — CBC WITH DIFFERENTIAL (CANCER CENTER ONLY)
Abs Immature Granulocytes: 0.02 10*3/uL (ref 0.00–0.07)
Basophils Absolute: 0 10*3/uL (ref 0.0–0.1)
Basophils Relative: 0 %
Eosinophils Absolute: 0.1 10*3/uL (ref 0.0–0.5)
Eosinophils Relative: 1 %
HCT: 33 % — ABNORMAL LOW (ref 39.0–52.0)
Hemoglobin: 10.8 g/dL — ABNORMAL LOW (ref 13.0–17.0)
Immature Granulocytes: 0 %
Lymphocytes Relative: 3 %
Lymphs Abs: 0.3 10*3/uL — ABNORMAL LOW (ref 0.7–4.0)
MCH: 32.4 pg (ref 26.0–34.0)
MCHC: 32.7 g/dL (ref 30.0–36.0)
MCV: 99.1 fL (ref 80.0–100.0)
Monocytes Absolute: 0.8 10*3/uL (ref 0.1–1.0)
Monocytes Relative: 8 %
Neutro Abs: 8.9 10*3/uL — ABNORMAL HIGH (ref 1.7–7.7)
Neutrophils Relative %: 88 %
Platelet Count: 166 10*3/uL (ref 150–400)
RBC: 3.33 MIL/uL — ABNORMAL LOW (ref 4.22–5.81)
RDW: 14.8 % (ref 11.5–15.5)
WBC Count: 10 10*3/uL (ref 4.0–10.5)
nRBC: 0 % (ref 0.0–0.2)

## 2023-06-17 LAB — CMP (CANCER CENTER ONLY)
ALT: 12 U/L (ref 0–44)
AST: 17 U/L (ref 15–41)
Albumin: 3.4 g/dL — ABNORMAL LOW (ref 3.5–5.0)
Alkaline Phosphatase: 54 U/L (ref 38–126)
Anion gap: 4 — ABNORMAL LOW (ref 5–15)
BUN: 23 mg/dL (ref 8–23)
CO2: 31 mmol/L (ref 22–32)
Calcium: 9.2 mg/dL (ref 8.9–10.3)
Chloride: 103 mmol/L (ref 98–111)
Creatinine: 0.74 mg/dL (ref 0.61–1.24)
GFR, Estimated: >60 mL/min (ref >60)
Glucose, Bld: 113 mg/dL — ABNORMAL HIGH (ref 70–99)
Potassium: 4.3 mmol/L (ref 3.5–5.1)
Sodium: 138 mmol/L (ref 135–145)
Total Bilirubin: 0.5 mg/dL (ref 0.3–1.2)
Total Protein: 6.4 g/dL — ABNORMAL LOW (ref 6.5–8.1)

## 2023-06-17 MED ORDER — SODIUM CHLORIDE 0.9% FLUSH
10.0000 mL | INTRAVENOUS | Status: DC | PRN
  Administered 2023-06-17: 10 mL

## 2023-06-17 MED ORDER — SODIUM CHLORIDE 0.9 % IV SOLN
200.0000 mg | Freq: Once | INTRAVENOUS | Status: AC
  Administered 2023-06-17: 200 mg via INTRAVENOUS
  Filled 2023-06-17: qty 200

## 2023-06-17 MED ORDER — HEPARIN SOD (PORK) LOCK FLUSH 100 UNIT/ML IV SOLN
500.0000 [IU] | Freq: Once | INTRAVENOUS | Status: AC | PRN
  Administered 2023-06-17: 500 [IU]

## 2023-06-17 MED ORDER — SODIUM CHLORIDE 0.9 % IV SOLN: Freq: Once | INTRAVENOUS | Status: AC

## 2023-06-17 MED ORDER — FAMOTIDINE 20 MG PO TABS
20.0000 mg | ORAL_TABLET | Freq: Once | ORAL | Status: AC
  Administered 2023-06-17: 20 mg via ORAL
  Filled 2023-06-17: qty 1

## 2023-06-17 MED ORDER — DIPHENHYDRAMINE HCL 25 MG PO CAPS
25.0000 mg | ORAL_CAPSULE | Freq: Once | ORAL | Status: AC
  Administered 2023-06-17: 25 mg via ORAL
  Filled 2023-06-17: qty 1
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
## 2023-06-17 NOTE — Patient Instructions (Signed)

## 2023-06-23 ENCOUNTER — Encounter: Payer: Self-pay | Admitting: Hematology

## 2023-07-02 ENCOUNTER — Telehealth: Payer: Self-pay | Admitting: Pulmonary Disease

## 2023-07-02 NOTE — Telephone Encounter (Signed)
PT was given a sample of Breztri. Pharm will not give her the price until RX is sent in. AVS states:  Stop Trelegy while taking the Breztri inhaler    Pharm is [facility].

## 2023-07-03 MED ORDER — BREZTRI AEROSPHERE 160-9-4.8 MCG/ACT IN AERO: 2.0000 | INHALATION_SPRAY | Freq: Two times a day (BID) | RESPIRATORY_TRACT | 5 refills | Status: DC

## 2023-07-03 NOTE — Telephone Encounter (Signed)
Spoke with pts spouse they would like rx Breztri sent to pharmacy. NFN

## 2023-07-07 ENCOUNTER — Other Ambulatory Visit: Payer: Self-pay | Admitting: Hematology

## 2023-07-07 DIAGNOSIS — C7951 Secondary malignant neoplasm of bone: Secondary | ICD-10-CM

## 2023-07-07 DIAGNOSIS — C3492 Malignant neoplasm of unspecified part of left bronchus or lung: Secondary | ICD-10-CM

## 2023-07-07 DIAGNOSIS — Z7189 Other specified counseling: Secondary | ICD-10-CM

## 2023-07-08 ENCOUNTER — Inpatient Hospital Stay: Payer: No Typology Code available for payment source

## 2023-07-08 ENCOUNTER — Other Ambulatory Visit: Payer: No Typology Code available for payment source

## 2023-07-08 ENCOUNTER — Ambulatory Visit: Payer: No Typology Code available for payment source | Admitting: Physician Assistant

## 2023-07-09 ENCOUNTER — Other Ambulatory Visit: Payer: No Typology Code available for payment source

## 2023-07-10 ENCOUNTER — Inpatient Hospital Stay: Payer: No Typology Code available for payment source

## 2023-07-10 ENCOUNTER — Inpatient Hospital Stay: Payer: No Typology Code available for payment source | Attending: Hematology

## 2023-07-10 VITALS — BP 97/60 | HR 83 | Temp 98.6°F | Resp 24 | Wt 109.5 lb

## 2023-07-10 DIAGNOSIS — C3492 Malignant neoplasm of unspecified part of left bronchus or lung: Secondary | ICD-10-CM

## 2023-07-10 DIAGNOSIS — Z7189 Other specified counseling: Secondary | ICD-10-CM

## 2023-07-10 DIAGNOSIS — Z79899 Other long term (current) drug therapy: Secondary | ICD-10-CM | POA: Diagnosis not present

## 2023-07-10 DIAGNOSIS — Z5112 Encounter for antineoplastic immunotherapy: Secondary | ICD-10-CM | POA: Insufficient documentation

## 2023-07-10 DIAGNOSIS — G893 Neoplasm related pain (acute) (chronic): Secondary | ICD-10-CM | POA: Diagnosis not present

## 2023-07-10 DIAGNOSIS — F17210 Nicotine dependence, cigarettes, uncomplicated: Secondary | ICD-10-CM | POA: Insufficient documentation

## 2023-07-10 DIAGNOSIS — C3412 Malignant neoplasm of upper lobe, left bronchus or lung: Secondary | ICD-10-CM | POA: Diagnosis present

## 2023-07-10 DIAGNOSIS — C7951 Secondary malignant neoplasm of bone: Secondary | ICD-10-CM

## 2023-07-10 DIAGNOSIS — C61 Malignant neoplasm of prostate: Secondary | ICD-10-CM | POA: Insufficient documentation

## 2023-07-10 DIAGNOSIS — Z8546 Personal history of malignant neoplasm of prostate: Secondary | ICD-10-CM | POA: Insufficient documentation

## 2023-07-10 LAB — CMP (CANCER CENTER ONLY)
ALT: 12 U/L (ref 0–44)
AST: 16 U/L (ref 15–41)
Albumin: 3.5 g/dL (ref 3.5–5.0)
Alkaline Phosphatase: 60 U/L (ref 38–126)
Anion gap: 5 (ref 5–15)
BUN: 25 mg/dL — ABNORMAL HIGH (ref 8–23)
CO2: 30 mmol/L (ref 22–32)
Calcium: 9.4 mg/dL (ref 8.9–10.3)
Chloride: 102 mmol/L (ref 98–111)
Creatinine: 0.75 mg/dL (ref 0.61–1.24)
GFR, Estimated: >60 mL/min (ref >60)
Glucose, Bld: 126 mg/dL — ABNORMAL HIGH (ref 70–99)
Potassium: 4.3 mmol/L (ref 3.5–5.1)
Sodium: 137 mmol/L (ref 135–145)
Total Bilirubin: 0.5 mg/dL (ref 0.3–1.2)
Total Protein: 6.9 g/dL (ref 6.5–8.1)

## 2023-07-10 LAB — CBC WITH DIFFERENTIAL (CANCER CENTER ONLY)
Abs Immature Granulocytes: 0.05 10*3/uL (ref 0.00–0.07)
Basophils Absolute: 0 10*3/uL (ref 0.0–0.1)
Basophils Relative: 0 %
Eosinophils Absolute: 0.1 10*3/uL (ref 0.0–0.5)
Eosinophils Relative: 0 %
HCT: 34.6 % — ABNORMAL LOW (ref 39.0–52.0)
Hemoglobin: 11.5 g/dL — ABNORMAL LOW (ref 13.0–17.0)
Immature Granulocytes: 0 %
Lymphocytes Relative: 2 %
Lymphs Abs: 0.3 10*3/uL — ABNORMAL LOW (ref 0.7–4.0)
MCH: 32 pg (ref 26.0–34.0)
MCHC: 33.2 g/dL (ref 30.0–36.0)
MCV: 96.4 fL (ref 80.0–100.0)
Monocytes Absolute: 0.7 10*3/uL (ref 0.1–1.0)
Monocytes Relative: 5 %
Neutro Abs: 12.5 10*3/uL — ABNORMAL HIGH (ref 1.7–7.7)
Neutrophils Relative %: 93 %
Platelet Count: 198 10*3/uL (ref 150–400)
RBC: 3.59 MIL/uL — ABNORMAL LOW (ref 4.22–5.81)
RDW: 14.4 % (ref 11.5–15.5)
WBC Count: 13.7 10*3/uL — ABNORMAL HIGH (ref 4.0–10.5)
nRBC: 0 % (ref 0.0–0.2)

## 2023-07-10 MED ORDER — DIPHENHYDRAMINE HCL 25 MG PO CAPS
25.0000 mg | ORAL_CAPSULE | Freq: Once | ORAL | Status: AC
Start: 2023-07-10 — End: 2023-07-10
  Administered 2023-07-10: 25 mg via ORAL
  Filled 2023-07-10: qty 1

## 2023-07-10 MED ORDER — HEPARIN SOD (PORK) LOCK FLUSH 100 UNIT/ML IV SOLN
500.0000 [IU] | Freq: Once | INTRAVENOUS | Status: AC | PRN
  Administered 2023-07-10: 500 [IU]

## 2023-07-10 MED ORDER — SODIUM CHLORIDE 0.9 % IV SOLN
200.0000 mg | Freq: Once | INTRAVENOUS | Status: AC
  Administered 2023-07-10: 200 mg via INTRAVENOUS
  Filled 2023-07-10: qty 200

## 2023-07-10 MED ORDER — SODIUM CHLORIDE 0.9% FLUSH
10.0000 mL | INTRAVENOUS | Status: DC | PRN
  Administered 2023-07-10: 10 mL

## 2023-07-10 MED ORDER — FAMOTIDINE 20 MG PO TABS
20.0000 mg | ORAL_TABLET | Freq: Once | ORAL | Status: AC
Start: 2023-07-10 — End: 2023-07-10
  Administered 2023-07-10: 20 mg via ORAL
  Filled 2023-07-10: qty 1

## 2023-07-10 MED ORDER — SODIUM CHLORIDE 0.9 % IV SOLN
Freq: Once | INTRAVENOUS | Status: AC
Start: 2023-07-10 — End: 2023-07-10

## 2023-07-10 NOTE — Patient Instructions (Signed)

## 2023-07-13 ENCOUNTER — Other Ambulatory Visit: Payer: Self-pay

## 2023-07-13 DIAGNOSIS — C3492 Malignant neoplasm of unspecified part of left bronchus or lung: Secondary | ICD-10-CM

## 2023-07-13 MED ORDER — FENTANYL 50 MCG/HR TD PT72: 1.0000 | MEDICATED_PATCH | TRANSDERMAL | 0 refills | Status: DC

## 2023-07-16 ENCOUNTER — Other Ambulatory Visit: Payer: Self-pay

## 2023-07-21 DIAGNOSIS — C3412 Malignant neoplasm of upper lobe, left bronchus or lung: Secondary | ICD-10-CM | POA: Diagnosis present

## 2023-07-21 DIAGNOSIS — Z8546 Personal history of malignant neoplasm of prostate: Secondary | ICD-10-CM | POA: Diagnosis not present

## 2023-07-21 DIAGNOSIS — Z5112 Encounter for antineoplastic immunotherapy: Secondary | ICD-10-CM | POA: Diagnosis present

## 2023-07-21 DIAGNOSIS — C7951 Secondary malignant neoplasm of bone: Secondary | ICD-10-CM | POA: Diagnosis present

## 2023-07-21 DIAGNOSIS — Z79899 Other long term (current) drug therapy: Secondary | ICD-10-CM | POA: Diagnosis not present

## 2023-07-21 DIAGNOSIS — C61 Malignant neoplasm of prostate: Secondary | ICD-10-CM | POA: Diagnosis not present

## 2023-07-21 DIAGNOSIS — G893 Neoplasm related pain (acute) (chronic): Secondary | ICD-10-CM | POA: Diagnosis not present

## 2023-07-21 DIAGNOSIS — F17210 Nicotine dependence, cigarettes, uncomplicated: Secondary | ICD-10-CM | POA: Diagnosis not present

## 2023-07-29 ENCOUNTER — Inpatient Hospital Stay: Payer: No Typology Code available for payment source

## 2023-07-29 ENCOUNTER — Inpatient Hospital Stay (HOSPITAL_BASED_OUTPATIENT_CLINIC_OR_DEPARTMENT_OTHER): Payer: No Typology Code available for payment source | Admitting: Hematology

## 2023-07-29 VITALS — BP 106/58 | HR 71 | Temp 97.5°F | Resp 16 | Wt 111.5 lb

## 2023-07-29 VITALS — BP 100/58 | HR 64 | Resp 16

## 2023-07-29 DIAGNOSIS — C3492 Malignant neoplasm of unspecified part of left bronchus or lung: Secondary | ICD-10-CM

## 2023-07-29 DIAGNOSIS — C7951 Secondary malignant neoplasm of bone: Secondary | ICD-10-CM

## 2023-07-29 DIAGNOSIS — C3412 Malignant neoplasm of upper lobe, left bronchus or lung: Secondary | ICD-10-CM

## 2023-07-29 DIAGNOSIS — Z5112 Encounter for antineoplastic immunotherapy: Secondary | ICD-10-CM | POA: Diagnosis not present

## 2023-07-29 DIAGNOSIS — Z7189 Other specified counseling: Secondary | ICD-10-CM

## 2023-07-29 DIAGNOSIS — Z95828 Presence of other vascular implants and grafts: Secondary | ICD-10-CM

## 2023-07-29 LAB — CBC WITH DIFFERENTIAL (CANCER CENTER ONLY)
Abs Immature Granulocytes: 0.02 10*3/uL (ref 0.00–0.07)
Basophils Absolute: 0 10*3/uL (ref 0.0–0.1)
Basophils Relative: 0 %
Eosinophils Absolute: 0.1 10*3/uL (ref 0.0–0.5)
Eosinophils Relative: 2 %
HCT: 33.8 % — ABNORMAL LOW (ref 39.0–52.0)
Hemoglobin: 11.2 g/dL — ABNORMAL LOW (ref 13.0–17.0)
Immature Granulocytes: 0 %
Lymphocytes Relative: 5 %
Lymphs Abs: 0.4 10*3/uL — ABNORMAL LOW (ref 0.7–4.0)
MCH: 32.2 pg (ref 26.0–34.0)
MCHC: 33.1 g/dL (ref 30.0–36.0)
MCV: 97.1 fL (ref 80.0–100.0)
Monocytes Absolute: 0.6 10*3/uL (ref 0.1–1.0)
Monocytes Relative: 7 %
Neutro Abs: 6.8 10*3/uL (ref 1.7–7.7)
Neutrophils Relative %: 86 %
Platelet Count: 163 10*3/uL (ref 150–400)
RBC: 3.48 MIL/uL — ABNORMAL LOW (ref 4.22–5.81)
RDW: 14.2 % (ref 11.5–15.5)
WBC Count: 7.9 10*3/uL (ref 4.0–10.5)
nRBC: 0 % (ref 0.0–0.2)

## 2023-07-29 LAB — CMP (CANCER CENTER ONLY)
ALT: 13 U/L (ref 0–44)
AST: 19 U/L (ref 15–41)
Albumin: 3.5 g/dL (ref 3.5–5.0)
Alkaline Phosphatase: 60 U/L (ref 38–126)
Anion gap: 3 — ABNORMAL LOW (ref 5–15)
BUN: 21 mg/dL (ref 8–23)
CO2: 33 mmol/L — ABNORMAL HIGH (ref 22–32)
Calcium: 9.4 mg/dL (ref 8.9–10.3)
Chloride: 103 mmol/L (ref 98–111)
Creatinine: 0.74 mg/dL (ref 0.61–1.24)
GFR, Estimated: >60 mL/min (ref >60)
Glucose, Bld: 86 mg/dL (ref 70–99)
Potassium: 4.3 mmol/L (ref 3.5–5.1)
Sodium: 139 mmol/L (ref 135–145)
Total Bilirubin: 0.4 mg/dL (ref <1.2)
Total Protein: 6.7 g/dL (ref 6.5–8.1)

## 2023-07-29 LAB — TSH: TSH: 1.951 u[IU]/mL (ref 0.350–4.500)

## 2023-07-29 MED ORDER — FAMOTIDINE 20 MG PO TABS
20.0000 mg | ORAL_TABLET | Freq: Once | ORAL | Status: AC
  Administered 2023-07-29: 20 mg via ORAL
  Filled 2023-07-29: qty 1

## 2023-07-29 MED ORDER — SODIUM CHLORIDE 0.9% FLUSH
10.0000 mL | INTRAVENOUS | Status: DC | PRN
  Administered 2023-07-29: 10 mL

## 2023-07-29 MED ORDER — DIPHENHYDRAMINE HCL 25 MG PO CAPS
25.0000 mg | ORAL_CAPSULE | Freq: Once | ORAL | Status: AC
Start: 2023-07-29 — End: 2023-07-29
  Administered 2023-07-29: 25 mg via ORAL
  Filled 2023-07-29: qty 1

## 2023-07-29 MED ORDER — SODIUM CHLORIDE 0.9 % IV SOLN: Freq: Once | INTRAVENOUS | Status: AC

## 2023-07-29 MED ORDER — SODIUM CHLORIDE 0.9 % IV SOLN
200.0000 mg | Freq: Once | INTRAVENOUS | Status: AC
  Administered 2023-07-29: 200 mg via INTRAVENOUS
  Filled 2023-07-29: qty 200

## 2023-07-29 MED ORDER — HEPARIN SOD (PORK) LOCK FLUSH 100 UNIT/ML IV SOLN
500.0000 [IU] | Freq: Once | INTRAVENOUS | Status: AC | PRN
  Administered 2023-07-29: 500 [IU]

## 2023-07-29 NOTE — Patient Instructions (Signed)

## 2023-07-29 NOTE — Progress Notes (Signed)
Patient seen by Dr. Kale  Vitals are within treatment parameters.  Labs reviewed: and are within treatment parameters.  Per physician team, patient is ready for treatment and there are NO modifications to the treatment plan.  
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
## 2023-07-30 ENCOUNTER — Other Ambulatory Visit: Payer: Self-pay

## 2023-07-30 LAB — T4: T4, Total: 8.3 ug/dL (ref 4.5–12.0)

## 2023-08-03 ENCOUNTER — Encounter (HOSPITAL_BASED_OUTPATIENT_CLINIC_OR_DEPARTMENT_OTHER): Payer: Self-pay | Admitting: Family Medicine

## 2023-08-03 ENCOUNTER — Ambulatory Visit (INDEPENDENT_AMBULATORY_CARE_PROVIDER_SITE_OTHER): Payer: Medicare Other | Admitting: Family Medicine

## 2023-08-03 VITALS — BP 101/58 | HR 91 | Temp 98.8°F | Ht 70.0 in | Wt 110.7 lb

## 2023-08-03 DIAGNOSIS — J441 Chronic obstructive pulmonary disease with (acute) exacerbation: Secondary | ICD-10-CM | POA: Diagnosis not present

## 2023-08-03 DIAGNOSIS — Z461 Encounter for fitting and adjustment of hearing aid: Secondary | ICD-10-CM | POA: Insufficient documentation

## 2023-08-03 MED ORDER — AZITHROMYCIN 250 MG PO TABS: ORAL_TABLET | ORAL | 0 refills | Status: AC

## 2023-08-03 MED ORDER — PREDNISONE 10 MG PO TABS: ORAL_TABLET | ORAL | 0 refills | Status: AC

## 2023-08-03 NOTE — Progress Notes (Signed)
    Procedures performed today:    None.  Independent interpretation of notes and tests performed by another provider:   None.  Brief History, Exam, Impression, and Recommendations:    BP (!) 101/58 (BP Location: Left Arm, Patient Position: Sitting, Cuff Size: Normal)   Pulse 91   Temp 98.8 F (37.1 C) (Oral)   Ht 5\' 10"  (1.778 m)   Wt 110 lb 11.2 oz (50.2 kg)   SpO2 97%   BMI 15.88 kg/m   COPD with acute exacerbation (HCC) Assessment & Plan: Patient presents with symptoms of cough, rhinorrhea, increased sputum production.  Patient did see his pulmonologist about 2 months ago.  At that time, did have changed to inhaler treatments.  Is currently utilizing Breztri, reports that he has been doing well with inhaler.  In discussing current symptoms, it does seem that patient will get some symptom improvement with antibiotics and steroid use.  He has received antibiotic/steroid treatment for COPD exacerbation at least 3-4 times over the last few months. On exam, patient is in no acute distress, vital signs stable.  Lungs with diffuse wheezing throughout.  No increased work of breathing or obvious dyspnea on exam today. Discussed considerations.  Given current symptoms, feel it would be reasonable to proceed with symptomatic management with utilization of azithromycin and prednisone as below.  Additionally discussed with patient that given frequency with which he is having symptoms of exacerbation including cough, increased sputum production with recommend discussing further with his pulmonologist as to what other management options can be considered to try to decrease frequency of these exacerbations   Other orders -     Azithromycin; Take 2 tablets (500 mg total) by mouth daily for 1 day, THEN 1 tablet (250 mg total) daily for 4 days.  Dispense: 6 each; Refill: 0 -     predniSONE; Take 4 tablets (40 mg total) by mouth daily with breakfast for 3 days, THEN 3 tablets (30 mg total) daily with  breakfast for 3 days, THEN 2 tablets (20 mg total) daily with breakfast for 3 days, THEN 1 tablet (10 mg total) daily with breakfast for 3 days.  Dispense: 30 tablet; Refill: 0   ___________________________________________ Cici Rodriges [name], MD, ABFM, [facility]

## 2023-08-03 NOTE — Assessment & Plan Note (Signed)
Patient presents with symptoms of cough, rhinorrhea, increased sputum production.  Patient did see his pulmonologist about 2 months ago.  At that time, did have changed to inhaler treatments.  Is currently utilizing Breztri, reports that he has been doing well with inhaler.  In discussing current symptoms, it does seem that patient will get some symptom improvement with antibiotics and steroid use.  He has received antibiotic/steroid treatment for COPD exacerbation at least 3-4 times over the last few months. On exam, patient is in no acute distress, vital signs stable.  Lungs with diffuse wheezing throughout.  No increased work of breathing or obvious dyspnea on exam today. Discussed considerations.  Given current symptoms, feel it would be reasonable to proceed with symptomatic management with utilization of azithromycin and prednisone as below.  Additionally discussed with patient that given frequency with which he is having symptoms of exacerbation including cough, increased sputum production with recommend discussing further with his pulmonologist as to what other management options can be considered to try to decrease frequency of these exacerbations

## 2023-08-04 ENCOUNTER — Encounter: Payer: Self-pay | Admitting: Hematology

## 2023-08-11 ENCOUNTER — Other Ambulatory Visit: Payer: Self-pay

## 2023-08-11 DIAGNOSIS — C3492 Malignant neoplasm of unspecified part of left bronchus or lung: Secondary | ICD-10-CM

## 2023-08-12 ENCOUNTER — Encounter: Payer: Self-pay | Admitting: Hematology

## 2023-08-12 MED ORDER — MORPHINE SULFATE 15 MG PO TABS
15.0000 mg | ORAL_TABLET | ORAL | 0 refills | Status: DC | PRN
Start: 2023-08-12 — End: 2023-09-11

## 2023-08-12 MED ORDER — FENTANYL 50 MCG/HR TD PT72: 1.0000 | MEDICATED_PATCH | TRANSDERMAL | 0 refills | Status: DC

## 2023-08-19 ENCOUNTER — Other Ambulatory Visit: Payer: Self-pay | Admitting: Hematology

## 2023-08-19 ENCOUNTER — Inpatient Hospital Stay: Payer: Medicare Other

## 2023-08-19 ENCOUNTER — Inpatient Hospital Stay: Payer: Medicare Other | Attending: Hematology

## 2023-08-19 ENCOUNTER — Inpatient Hospital Stay: Payer: No Typology Code available for payment source | Admitting: Hematology

## 2023-08-19 DIAGNOSIS — Z7189 Other specified counseling: Secondary | ICD-10-CM

## 2023-08-19 DIAGNOSIS — Z8546 Personal history of malignant neoplasm of prostate: Secondary | ICD-10-CM | POA: Insufficient documentation

## 2023-08-19 DIAGNOSIS — C3492 Malignant neoplasm of unspecified part of left bronchus or lung: Secondary | ICD-10-CM

## 2023-08-19 DIAGNOSIS — C3412 Malignant neoplasm of upper lobe, left bronchus or lung: Secondary | ICD-10-CM | POA: Insufficient documentation

## 2023-08-19 DIAGNOSIS — C7951 Secondary malignant neoplasm of bone: Secondary | ICD-10-CM

## 2023-08-19 DIAGNOSIS — Z5112 Encounter for antineoplastic immunotherapy: Secondary | ICD-10-CM | POA: Insufficient documentation

## 2023-08-19 DIAGNOSIS — F17210 Nicotine dependence, cigarettes, uncomplicated: Secondary | ICD-10-CM | POA: Insufficient documentation

## 2023-08-19 DIAGNOSIS — Z95828 Presence of other vascular implants and grafts: Secondary | ICD-10-CM

## 2023-08-19 LAB — CMP (CANCER CENTER ONLY)
ALT: 17 U/L (ref 0–44)
AST: 23 U/L (ref 15–41)
Albumin: 3.6 g/dL (ref 3.5–5.0)
Alkaline Phosphatase: 61 U/L (ref 38–126)
Anion gap: 4 — ABNORMAL LOW (ref 5–15)
BUN: 19 mg/dL (ref 8–23)
CO2: 33 mmol/L — ABNORMAL HIGH (ref 22–32)
Calcium: 9.5 mg/dL (ref 8.9–10.3)
Chloride: 101 mmol/L (ref 98–111)
Creatinine: 0.75 mg/dL (ref 0.61–1.24)
GFR, Estimated: >60 mL/min (ref >60)
Glucose, Bld: 106 mg/dL — ABNORMAL HIGH (ref 70–99)
Potassium: 4.5 mmol/L (ref 3.5–5.1)
Sodium: 138 mmol/L (ref 135–145)
Total Bilirubin: 0.5 mg/dL (ref <1.2)
Total Protein: 6.7 g/dL (ref 6.5–8.1)

## 2023-08-19 LAB — CBC WITH DIFFERENTIAL (CANCER CENTER ONLY)
Abs Immature Granulocytes: 0.01 10*3/uL (ref 0.00–0.07)
Basophils Absolute: 0 10*3/uL (ref 0.0–0.1)
Basophils Relative: 0 %
Eosinophils Absolute: 0.1 10*3/uL (ref 0.0–0.5)
Eosinophils Relative: 1 %
HCT: 33.3 % — ABNORMAL LOW (ref 39.0–52.0)
Hemoglobin: 11.1 g/dL — ABNORMAL LOW (ref 13.0–17.0)
Immature Granulocytes: 0 %
Lymphocytes Relative: 3 %
Lymphs Abs: 0.3 10*3/uL — ABNORMAL LOW (ref 0.7–4.0)
MCH: 32.3 pg (ref 26.0–34.0)
MCHC: 33.3 g/dL (ref 30.0–36.0)
MCV: 96.8 fL (ref 80.0–100.0)
Monocytes Absolute: 0.7 10*3/uL (ref 0.1–1.0)
Monocytes Relative: 7 %
Neutro Abs: 8.5 10*3/uL — ABNORMAL HIGH (ref 1.7–7.7)
Neutrophils Relative %: 89 %
Platelet Count: 159 10*3/uL (ref 150–400)
RBC: 3.44 MIL/uL — ABNORMAL LOW (ref 4.22–5.81)
RDW: 14.7 % (ref 11.5–15.5)
WBC Count: 9.6 10*3/uL (ref 4.0–10.5)
nRBC: 0 % (ref 0.0–0.2)

## 2023-08-19 MED ORDER — SODIUM CHLORIDE 0.9 % IV SOLN
200.0000 mg | Freq: Once | INTRAVENOUS | Status: AC
  Administered 2023-08-19: 200 mg via INTRAVENOUS
  Filled 2023-08-19: qty 200

## 2023-08-19 MED ORDER — SODIUM CHLORIDE 0.9% FLUSH
10.0000 mL | INTRAVENOUS | Status: DC | PRN
Start: 2023-08-19 — End: 2023-08-19
  Administered 2023-08-19: 10 mL

## 2023-08-19 MED ORDER — HEPARIN SOD (PORK) LOCK FLUSH 100 UNIT/ML IV SOLN
500.0000 [IU] | Freq: Once | INTRAVENOUS | Status: AC | PRN
Start: 2023-08-19 — End: 2023-08-19
  Administered 2023-08-19: 500 [IU]

## 2023-08-19 MED ORDER — DIPHENHYDRAMINE HCL 25 MG PO CAPS
25.0000 mg | ORAL_CAPSULE | Freq: Once | ORAL | Status: AC
Start: 2023-08-19 — End: 2023-08-19
  Administered 2023-08-19: 25 mg via ORAL
  Filled 2023-08-19: qty 1

## 2023-08-19 MED ORDER — SODIUM CHLORIDE 0.9 % IV SOLN: Freq: Once | INTRAVENOUS | Status: AC

## 2023-08-19 MED ORDER — FAMOTIDINE 20 MG PO TABS
20.0000 mg | ORAL_TABLET | Freq: Once | ORAL | Status: AC
  Administered 2023-08-19: 20 mg via ORAL
  Filled 2023-08-19: qty 1

## 2023-08-19 MED ORDER — SODIUM CHLORIDE 0.9% FLUSH
10.0000 mL | INTRAVENOUS | Status: DC | PRN
  Administered 2023-08-19: 10 mL

## 2023-08-19 NOTE — Progress Notes (Unsigned)
CT chest WO contrast in 5-7 days

## 2023-08-20 ENCOUNTER — Encounter: Payer: Self-pay | Admitting: Hematology

## 2023-08-25 ENCOUNTER — Encounter: Payer: Self-pay | Admitting: Hematology

## 2023-08-27 ENCOUNTER — Ambulatory Visit (HOSPITAL_COMMUNITY)
Admission: RE | Admit: 2023-08-27 | Discharge: 2023-08-27 | Disposition: A | Discharge status: Home / Self Care | Payer: Medicare Other | Source: Ambulatory Visit | Attending: Hematology | Admitting: Hematology

## 2023-08-27 DIAGNOSIS — C3492 Malignant neoplasm of unspecified part of left bronchus or lung: Secondary | ICD-10-CM | POA: Diagnosis present

## 2023-09-04 ENCOUNTER — Encounter: Payer: Self-pay | Admitting: Hematology

## 2023-09-04 NOTE — Progress Notes (Signed)
This encounter was created in error - please disregard.

## 2023-09-10 ENCOUNTER — Inpatient Hospital Stay: Payer: Medicare Other

## 2023-09-10 ENCOUNTER — Inpatient Hospital Stay: Payer: Medicare Other | Attending: Hematology

## 2023-09-10 VITALS — BP 105/63 | HR 81 | Temp 98.6°F | Resp 20 | Wt 108.8 lb

## 2023-09-10 DIAGNOSIS — F17210 Nicotine dependence, cigarettes, uncomplicated: Secondary | ICD-10-CM | POA: Insufficient documentation

## 2023-09-10 DIAGNOSIS — C7951 Secondary malignant neoplasm of bone: Secondary | ICD-10-CM | POA: Diagnosis present

## 2023-09-10 DIAGNOSIS — Z5112 Encounter for antineoplastic immunotherapy: Secondary | ICD-10-CM | POA: Insufficient documentation

## 2023-09-10 DIAGNOSIS — C61 Malignant neoplasm of prostate: Secondary | ICD-10-CM | POA: Insufficient documentation

## 2023-09-10 DIAGNOSIS — Z79899 Other long term (current) drug therapy: Secondary | ICD-10-CM | POA: Diagnosis not present

## 2023-09-10 DIAGNOSIS — G893 Neoplasm related pain (acute) (chronic): Secondary | ICD-10-CM | POA: Diagnosis not present

## 2023-09-10 DIAGNOSIS — Z7189 Other specified counseling: Secondary | ICD-10-CM

## 2023-09-10 DIAGNOSIS — C3412 Malignant neoplasm of upper lobe, left bronchus or lung: Secondary | ICD-10-CM | POA: Diagnosis present

## 2023-09-10 DIAGNOSIS — Z95828 Presence of other vascular implants and grafts: Secondary | ICD-10-CM

## 2023-09-10 DIAGNOSIS — C3492 Malignant neoplasm of unspecified part of left bronchus or lung: Secondary | ICD-10-CM

## 2023-09-10 LAB — CBC WITH DIFFERENTIAL (CANCER CENTER ONLY)
Abs Immature Granulocytes: 0.03 10*3/uL (ref 0.00–0.07)
Basophils Absolute: 0 10*3/uL (ref 0.0–0.1)
Basophils Relative: 0 %
Eosinophils Absolute: 0.1 10*3/uL (ref 0.0–0.5)
Eosinophils Relative: 1 %
HCT: 31.6 % — ABNORMAL LOW (ref 39.0–52.0)
Hemoglobin: 10.8 g/dL — ABNORMAL LOW (ref 13.0–17.0)
Immature Granulocytes: 0 %
Lymphocytes Relative: 5 %
Lymphs Abs: 0.4 10*3/uL — ABNORMAL LOW (ref 0.7–4.0)
MCH: 32.9 pg (ref 26.0–34.0)
MCHC: 34.2 g/dL (ref 30.0–36.0)
MCV: 96.3 fL (ref 80.0–100.0)
Monocytes Absolute: 0.7 10*3/uL (ref 0.1–1.0)
Monocytes Relative: 8 %
Neutro Abs: 7.8 10*3/uL — ABNORMAL HIGH (ref 1.7–7.7)
Neutrophils Relative %: 86 %
Platelet Count: 178 10*3/uL (ref 150–400)
RBC: 3.28 MIL/uL — ABNORMAL LOW (ref 4.22–5.81)
RDW: 14.6 % (ref 11.5–15.5)
WBC Count: 9.1 10*3/uL (ref 4.0–10.5)
nRBC: 0 % (ref 0.0–0.2)

## 2023-09-10 LAB — CMP (CANCER CENTER ONLY)
ALT: 13 U/L (ref 0–44)
AST: 18 U/L (ref 15–41)
Albumin: 3.6 g/dL (ref 3.5–5.0)
Alkaline Phosphatase: 62 U/L (ref 38–126)
Anion gap: 3 — ABNORMAL LOW (ref 5–15)
BUN: 23 mg/dL (ref 8–23)
CO2: 32 mmol/L (ref 22–32)
Calcium: 9.5 mg/dL (ref 8.9–10.3)
Chloride: 103 mmol/L (ref 98–111)
Creatinine: 0.85 mg/dL (ref 0.61–1.24)
GFR, Estimated: >60 mL/min (ref >60)
Glucose, Bld: 103 mg/dL — ABNORMAL HIGH (ref 70–99)
Potassium: 4.5 mmol/L (ref 3.5–5.1)
Sodium: 138 mmol/L (ref 135–145)
Total Bilirubin: 0.5 mg/dL (ref 0.0–1.2)
Total Protein: 7 g/dL (ref 6.5–8.1)

## 2023-09-10 MED ORDER — FAMOTIDINE 20 MG PO TABS
20.0000 mg | ORAL_TABLET | Freq: Once | ORAL | Status: AC
  Administered 2023-09-10: 20 mg via ORAL
  Filled 2023-09-10: qty 1

## 2023-09-10 MED ORDER — SODIUM CHLORIDE 0.9 % IV SOLN: Freq: Once | INTRAVENOUS | Status: AC

## 2023-09-10 MED ORDER — SODIUM CHLORIDE 0.9 % IV SOLN
200.0000 mg | Freq: Once | INTRAVENOUS | Status: AC
  Administered 2023-09-10: 200 mg via INTRAVENOUS
  Filled 2023-09-10: qty 200

## 2023-09-10 MED ORDER — DIPHENHYDRAMINE HCL 25 MG PO CAPS
25.0000 mg | ORAL_CAPSULE | Freq: Once | ORAL | Status: AC
  Administered 2023-09-10: 25 mg via ORAL
  Filled 2023-09-10: qty 1

## 2023-09-10 MED ORDER — HEPARIN SOD (PORK) LOCK FLUSH 100 UNIT/ML IV SOLN
500.0000 [IU] | Freq: Once | INTRAVENOUS | Status: AC | PRN
Start: 2023-09-10 — End: 2023-09-10
  Administered 2023-09-10: 500 [IU]

## 2023-09-10 MED ORDER — SODIUM CHLORIDE 0.9% FLUSH
10.0000 mL | INTRAVENOUS | Status: DC | PRN
  Administered 2023-09-10: 10 mL

## 2023-09-10 MED ORDER — SODIUM CHLORIDE 0.9% FLUSH
10.0000 mL | Freq: Once | INTRAVENOUS | Status: AC
Start: 2023-09-10 — End: 2023-09-10
  Administered 2023-09-10: 10 mL via INTRAVENOUS

## 2023-09-10 NOTE — Patient Instructions (Signed)
 [facility]  Discharge Instructions: Thank you for choosing [facility] to provide your oncology and hematology care.   If you have a lab appointment with the Cancer Center, please go directly to the Cancer Center and check in at the registration area.   Wear comfortable clothing and clothing appropriate for easy access to any Portacath or PICC line.   We strive to give you quality time with your provider. You may need to reschedule your appointment if you arrive late (15 or more minutes).  Arriving late affects you and other patients whose appointments are after yours.  Also, if you miss three or more appointments without notifying the office, you may be dismissed from the clinic at the provider's discretion.      For prescription refill requests, have your pharmacy contact our office and allow 72 hours for refills to be completed.    Today you received the following chemotherapy and/or immunotherapy agent: Pembrolizumab (Keytruda)      To help prevent nausea and vomiting after your treatment, we encourage you to take your nausea medication as directed.  BELOW ARE SYMPTOMS THAT SHOULD BE REPORTED IMMEDIATELY: *FEVER GREATER THAN 100.4 F (38 C) OR HIGHER *CHILLS OR SWEATING *NAUSEA AND VOMITING THAT IS NOT CONTROLLED WITH YOUR NAUSEA MEDICATION *UNUSUAL SHORTNESS OF BREATH *UNUSUAL BRUISING OR BLEEDING *URINARY PROBLEMS (pain or burning when urinating, or frequent urination) *BOWEL PROBLEMS (unusual diarrhea, constipation, pain near the anus) TENDERNESS IN MOUTH AND THROAT WITH OR WITHOUT PRESENCE OF ULCERS (sore throat, sores in mouth, or a toothache) UNUSUAL RASH, SWELLING OR PAIN  UNUSUAL VAGINAL DISCHARGE OR ITCHING   Items with * indicate a potential emergency and should be followed up as soon as possible or go to the Emergency Department if any problems should occur.  Please show the CHEMOTHERAPY ALERT CARD or  IMMUNOTHERAPY ALERT CARD at check-in to the Emergency Department and triage nurse.  Should you have questions after your visit or need to cancel or reschedule your appointment, please contact [facility]  Dept: [phone]  and follow the prompts.  Office hours are 8:00 a.m. to 4:30 p.m. Monday - Friday. Please note that voicemails left after 4:00 p.m. may not be returned until the following business day.  We are closed weekends and major holidays. You have access to a nurse at all times for urgent questions. Please call the main number to the clinic Dept: [phone] and follow the prompts.   For any non-urgent questions, you may also contact your provider using MyChart. We now offer e-Visits for anyone 3 and older to request care online for non-urgent symptoms. For details visit [email or web address].   Also download the MyChart app! Go to the app store, search "MyChart", open the app, select [place], and log in with your MyChart username and password. Pembrolizumab Injection What is this medication? PEMBROLIZUMAB (PEM broe LIZ ue mab) treats some types of cancer. It works by helping your immune system slow or stop the spread of cancer cells. It is a monoclonal antibody. This medicine may be used for other purposes; ask your health care provider or pharmacist if you have questions. COMMON BRAND NAME(S): Keytruda What should I tell my care team before I take this medication? They need to know if you have any of these conditions: Allogeneic stem cell transplant (uses someone else's stem cells)  Autoimmune diseases, such as Crohn disease, ulcerative colitis, lupus History of chest radiation Nervous system problems, such as Guillain-Barre syndrome, myasthenia gravis Organ transplant An unusual or allergic reaction to pembrolizumab, other medications, foods, dyes, or preservatives Pregnant or trying to get pregnant Breast-feeding How  should I use this medication? This medication is injected into a vein. It is given by your care team in a hospital or clinic setting. A special MedGuide will be given to you before each treatment. Be sure to read this information carefully each time. Talk to your care team about the use of this medication in children. While it may be prescribed for children as young as 6 months for selected conditions, precautions do apply. Overdosage: If you think you have taken too much of this medicine contact a poison control center or emergency room at once. NOTE: This medicine is only for you. Do not share this medicine with others. What if I miss a dose? Keep appointments for follow-up doses. It is important not to miss your dose. Call your care team if you are unable to keep an appointment. What may interact with this medication? Interactions have not been studied. This list may not describe all possible interactions. Give your health care provider a list of all the medicines, herbs, non-prescription drugs, or dietary supplements you use. Also tell them if you smoke, drink alcohol, or use illegal drugs. Some items may interact with your medicine. What should I watch for while using this medication? Your condition will be monitored carefully while you are receiving this medication. You may need blood work while taking this medication. This medication may cause serious skin reactions. They can happen weeks to months after starting the medication. Contact your care team right away if you notice fevers or flu-like symptoms with a rash. The rash may be red or purple and then turn into blisters or peeling of the skin. You may also notice a red rash with swelling of the face, lips, or lymph nodes in your neck or under your arms. Tell your care team right away if you have any change in your eyesight. Talk to your care team if you may be pregnant. Serious birth defects can occur if you take this medication during  pregnancy and for 4 months after the last dose. You will need a negative pregnancy test before starting this medication. Contraception is recommended while taking this medication and for 4 months after the last dose. Your care team can help you find the option that works for you. Do not breastfeed while taking this medication and for 4 months after the last dose. What side effects may I notice from receiving this medication? Side effects that you should report to your care team as soon as possible: Allergic reactions--skin rash, itching, hives, swelling of the face, lips, tongue, or throat Dry cough, shortness of breath or trouble breathing Eye pain, redness, irritation, or discharge with blurry or decreased vision Heart muscle inflammation--unusual weakness or fatigue, shortness of breath, chest pain, fast or irregular heartbeat, dizziness, swelling of the ankles, feet, or hands Hormone gland problems--headache, sensitivity to light, unusual weakness or fatigue, dizziness, fast or irregular heartbeat, increased sensitivity to cold or heat, excessive sweating, constipation, hair loss, increased thirst or amount of urine, tremors or shaking, irritability Infusion reactions--chest pain, shortness of breath or trouble breathing, feeling faint or lightheaded Kidney injury (glomerulonephritis)--decrease in the amount of urine, red or dark brown urine, foamy or bubbly urine, swelling of the ankles, hands, or feet  Liver injury--right upper belly pain, loss of appetite, nausea, light-colored stool, dark yellow or brown urine, yellowing skin or eyes, unusual weakness or fatigue Pain, tingling, or numbness in the hands or feet, muscle weakness, change in vision, confusion or trouble speaking, loss of balance or coordination, trouble walking, seizures Rash, fever, and swollen lymph nodes Redness, blistering, peeling, or loosening of the skin, including inside the mouth Sudden or severe stomach pain, bloody  diarrhea, fever, nausea, vomiting Side effects that usually do not require medical attention (report to your care team if they continue or are bothersome): Bone, joint, or muscle pain Diarrhea Fatigue Loss of appetite Nausea Skin rash This list may not describe all possible side effects. Call your doctor for medical advice about side effects. You may report side effects to FDA at 1-800-FDA-1088. Where should I keep my medication? This medication is given in a hospital or clinic. It will not be stored at home. NOTE: This sheet is a summary. It may not cover all possible information. If you have questions about this medicine, talk to your doctor, pharmacist, or health care provider.  2024 Elsevier/Gold Standard (2022-01-07 00:00:00)

## 2023-09-11 ENCOUNTER — Other Ambulatory Visit: Payer: Self-pay

## 2023-09-11 DIAGNOSIS — C3492 Malignant neoplasm of unspecified part of left bronchus or lung: Secondary | ICD-10-CM

## 2023-09-11 MED ORDER — ONDANSETRON HCL 8 MG PO TABS: 8.0000 mg | ORAL_TABLET | Freq: Three times a day (TID) | ORAL | 2 refills | Status: DC | PRN

## 2023-09-11 MED ORDER — FENTANYL 50 MCG/HR TD PT72: 1.0000 | MEDICATED_PATCH | TRANSDERMAL | 0 refills | Status: DC

## 2023-09-11 MED ORDER — MORPHINE SULFATE 15 MG PO TABS
15.0000 mg | ORAL_TABLET | ORAL | 0 refills | Status: DC | PRN
Start: 2023-09-11 — End: 2023-10-08

## 2023-09-23 ENCOUNTER — Telehealth (HOSPITAL_BASED_OUTPATIENT_CLINIC_OR_DEPARTMENT_OTHER): Payer: Self-pay | Admitting: *Deleted

## 2023-09-23 NOTE — Telephone Encounter (Signed)
 Copied from CRM [id number]. Topic: Clinical - Medical Advice >> Sep 23, 2023 10:26 AM Roseanne Cones wrote: Reason for CRM: Patient's spouse is calling to report that the patient has been experiencing nasal congestion, cough and no appetite. They mentioned that the clinic will typically prescribe prednisone  - does the patient need to come in for an appointment?

## 2023-09-23 NOTE — Telephone Encounter (Signed)
 Pt will ned to come in for an appointment for those symptoms please schedule

## 2023-09-27 ENCOUNTER — Other Ambulatory Visit: Payer: Self-pay

## 2023-10-02 ENCOUNTER — Telehealth: Payer: Self-pay

## 2023-10-02 ENCOUNTER — Inpatient Hospital Stay: Payer: Medicare Other

## 2023-10-02 ENCOUNTER — Inpatient Hospital Stay (HOSPITAL_BASED_OUTPATIENT_CLINIC_OR_DEPARTMENT_OTHER): Payer: Medicare Other | Admitting: Hematology

## 2023-10-02 VITALS — BP 100/63 | HR 85 | Temp 98.7°F | Resp 16 | Wt 108.5 lb

## 2023-10-02 VITALS — BP 99/55 | HR 64 | Resp 16

## 2023-10-02 DIAGNOSIS — Z5112 Encounter for antineoplastic immunotherapy: Secondary | ICD-10-CM | POA: Diagnosis not present

## 2023-10-02 DIAGNOSIS — C3492 Malignant neoplasm of unspecified part of left bronchus or lung: Secondary | ICD-10-CM

## 2023-10-02 DIAGNOSIS — Z7189 Other specified counseling: Secondary | ICD-10-CM

## 2023-10-02 DIAGNOSIS — C3412 Malignant neoplasm of upper lobe, left bronchus or lung: Secondary | ICD-10-CM

## 2023-10-02 DIAGNOSIS — C7951 Secondary malignant neoplasm of bone: Secondary | ICD-10-CM

## 2023-10-02 DIAGNOSIS — Z95828 Presence of other vascular implants and grafts: Secondary | ICD-10-CM

## 2023-10-02 LAB — CBC WITH DIFFERENTIAL (CANCER CENTER ONLY)
Abs Immature Granulocytes: 0.02 10*3/uL (ref 0.00–0.07)
Basophils Absolute: 0 10*3/uL (ref 0.0–0.1)
Basophils Relative: 0 %
Eosinophils Absolute: 0.3 10*3/uL (ref 0.0–0.5)
Eosinophils Relative: 3 %
HCT: 33.8 % — ABNORMAL LOW (ref 39.0–52.0)
Hemoglobin: 11 g/dL — ABNORMAL LOW (ref 13.0–17.0)
Immature Granulocytes: 0 %
Lymphocytes Relative: 4 %
Lymphs Abs: 0.3 10*3/uL — ABNORMAL LOW (ref 0.7–4.0)
MCH: 31.6 pg (ref 26.0–34.0)
MCHC: 32.5 g/dL (ref 30.0–36.0)
MCV: 97.1 fL (ref 80.0–100.0)
Monocytes Absolute: 0.8 10*3/uL (ref 0.1–1.0)
Monocytes Relative: 9 %
Neutro Abs: 7.5 10*3/uL (ref 1.7–7.7)
Neutrophils Relative %: 84 %
Platelet Count: 171 10*3/uL (ref 150–400)
RBC: 3.48 MIL/uL — ABNORMAL LOW (ref 4.22–5.81)
RDW: 14.5 % (ref 11.5–15.5)
WBC Count: 9 10*3/uL (ref 4.0–10.5)
nRBC: 0 % (ref 0.0–0.2)

## 2023-10-02 LAB — CMP (CANCER CENTER ONLY)
ALT: 20 U/L (ref 0–44)
AST: 23 U/L (ref 15–41)
Albumin: 3.6 g/dL (ref 3.5–5.0)
Alkaline Phosphatase: 61 U/L (ref 38–126)
Anion gap: 5 (ref 5–15)
BUN: 28 mg/dL — ABNORMAL HIGH (ref 8–23)
CO2: 30 mmol/L (ref 22–32)
Calcium: 9.4 mg/dL (ref 8.9–10.3)
Chloride: 102 mmol/L (ref 98–111)
Creatinine: 0.9 mg/dL (ref 0.61–1.24)
GFR, Estimated: >60 mL/min (ref >60)
Glucose, Bld: 107 mg/dL — ABNORMAL HIGH (ref 70–99)
Potassium: 4.4 mmol/L (ref 3.5–5.1)
Sodium: 137 mmol/L (ref 135–145)
Total Bilirubin: 0.3 mg/dL (ref 0.0–1.2)
Total Protein: 6.9 g/dL (ref 6.5–8.1)

## 2023-10-02 LAB — TSH: TSH: 2.718 u[IU]/mL (ref 0.350–4.500)

## 2023-10-02 MED ORDER — SODIUM CHLORIDE 0.9% FLUSH
10.0000 mL | INTRAVENOUS | Status: DC | PRN
  Administered 2023-10-02: 10 mL

## 2023-10-02 MED ORDER — DRONABINOL 2.5 MG PO CAPS: 2.5000 mg | ORAL_CAPSULE | Freq: Two times a day (BID) | ORAL | 1 refills | Status: AC

## 2023-10-02 MED ORDER — FAMOTIDINE 20 MG PO TABS
20.0000 mg | ORAL_TABLET | Freq: Once | ORAL | Status: AC
  Administered 2023-10-02: 20 mg via ORAL
  Filled 2023-10-02: qty 1

## 2023-10-02 MED ORDER — DULOXETINE HCL 30 MG PO CPEP: 60.0000 mg | ORAL_CAPSULE | Freq: Every day | ORAL | 3 refills | Status: DC

## 2023-10-02 MED ORDER — SODIUM CHLORIDE 0.9 % IV SOLN
200.0000 mg | Freq: Once | INTRAVENOUS | Status: AC
  Administered 2023-10-02: 200 mg via INTRAVENOUS
  Filled 2023-10-02: qty 200

## 2023-10-02 MED ORDER — SODIUM CHLORIDE 0.9 % IV SOLN: Freq: Once | INTRAVENOUS | Status: AC

## 2023-10-02 MED ORDER — HEPARIN SOD (PORK) LOCK FLUSH 100 UNIT/ML IV SOLN
500.0000 [IU] | Freq: Once | INTRAVENOUS | Status: AC | PRN
  Administered 2023-10-02: 500 [IU]

## 2023-10-02 MED ORDER — DIPHENHYDRAMINE HCL 25 MG PO CAPS
25.0000 mg | ORAL_CAPSULE | Freq: Once | ORAL | Status: AC
  Administered 2023-10-02: 25 mg via ORAL
  Filled 2023-10-02: qty 1

## 2023-10-02 NOTE — Patient Instructions (Addendum)
[facility]   Discharge Instructions: Thank you for choosing [facility] to provide your oncology and hematology care.   If you have a lab appointment with the Cancer Center, please go directly to the Cancer Center and check in at the registration area.   Wear comfortable clothing and clothing appropriate for easy access to any Portacath or PICC line.   We strive to give you quality time with your provider. You may need to reschedule your appointment if you arrive late (15 or more minutes).  Arriving late affects you and other patients whose appointments are after yours.  Also, if you miss three or more appointments without notifying the office, you may be dismissed from the clinic at the provider's discretion.      For prescription refill requests, have your pharmacy contact our office and allow 72 hours for refills to be completed.    Today you received the following chemotherapy and/or immunotherapy agents: Pembrolizumab Rande Lawman)      To help prevent nausea and vomiting after your treatment, we encourage you to take your nausea medication as directed.  BELOW ARE SYMPTOMS THAT SHOULD BE REPORTED IMMEDIATELY: *FEVER GREATER THAN 100.4 F (38 C) OR HIGHER *CHILLS OR SWEATING *NAUSEA AND VOMITING THAT IS NOT CONTROLLED WITH YOUR NAUSEA MEDICATION *UNUSUAL SHORTNESS OF BREATH *UNUSUAL BRUISING OR BLEEDING *URINARY PROBLEMS (pain or burning when urinating, or frequent urination) *BOWEL PROBLEMS (unusual diarrhea, constipation, pain near the anus) TENDERNESS IN MOUTH AND THROAT WITH OR WITHOUT PRESENCE OF ULCERS (sore throat, sores in mouth, or a toothache) UNUSUAL RASH, SWELLING OR PAIN  UNUSUAL VAGINAL DISCHARGE OR ITCHING   Items with * indicate a potential emergency and should be followed up as soon as possible or go to the Emergency Department if any problems should occur.  Please show the CHEMOTHERAPY ALERT CARD  or IMMUNOTHERAPY ALERT CARD at check-in to the Emergency Department and triage nurse.  Should you have questions after your visit or need to cancel or reschedule your appointment, please contact [facility]  Dept: [phone]  and follow the prompts.  Office hours are 8:00 a.m. to 4:30 p.m. Monday - Friday. Please note that voicemails left after 4:00 p.m. may not be returned until the following business day.  We are closed weekends and major holidays. You have access to a nurse at all times for urgent questions. Please call the main number to the clinic Dept: [phone] and follow the prompts.   For any non-urgent questions, you may also contact your provider using MyChart. We now offer e-Visits for anyone 87 and older to request care online for non-urgent symptoms. For details visit [email or web address].   Also download the MyChart app! Go to the app store, search "MyChart", open the app, select [place], and log in with your MyChart username and password.  [facility]  Discharge Instructions: Thank you for choosing [facility] to provide your oncology and hematology care.   If you have a lab appointment with the Cancer Center, please go directly to the Cancer Center and check in at the registration area.   Wear comfortable clothing and clothing appropriate for easy access to any Portacath or PICC line.   We strive to give you quality time with your provider. You may  need to reschedule your appointment if you arrive late (15 or more minutes).  Arriving late affects you and other patients whose appointments are after yours.  Also, if you miss three or more appointments without notifying the office, you may be dismissed from the clinic at the provider's discretion.      For prescription refill requests, have your pharmacy contact our office and allow 72 hours for  refills to be completed.    Today you received the following chemotherapy and/or immunotherapy agents Pembrolizumab-( Keytruda)   To help prevent nausea and vomiting after your treatment, we encourage you to take your nausea medication as directed.  BELOW ARE SYMPTOMS THAT SHOULD BE REPORTED IMMEDIATELY: *FEVER GREATER THAN 100.4 F (38 C) OR HIGHER *CHILLS OR SWEATING *NAUSEA AND VOMITING THAT IS NOT CONTROLLED WITH YOUR NAUSEA MEDICATION *UNUSUAL SHORTNESS OF BREATH *UNUSUAL BRUISING OR BLEEDING *URINARY PROBLEMS (pain or burning when urinating, or frequent urination) *BOWEL PROBLEMS (unusual diarrhea, constipation, pain near the anus) TENDERNESS IN MOUTH AND THROAT WITH OR WITHOUT PRESENCE OF ULCERS (sore throat, sores in mouth, or a toothache) UNUSUAL RASH, SWELLING OR PAIN  UNUSUAL VAGINAL DISCHARGE OR ITCHING   Items with * indicate a potential emergency and should be followed up as soon as possible or go to the Emergency Department if any problems should occur.  Please show the CHEMOTHERAPY ALERT CARD or IMMUNOTHERAPY ALERT CARD at check-in to the Emergency Department and triage nurse.  Should you have questions after your visit or need to cancel or reschedule your appointment, please contact [facility]  Dept: [phone]  and follow the prompts.  Office hours are 8:00 a.m. to 4:30 p.m. Monday - Friday. Please note that voicemails left after 4:00 p.m. may not be returned until the following business day.  We are closed weekends and major holidays. You have access to a nurse at all times for urgent questions. Please call the main number to the clinic Dept: [phone] and follow the prompts.   For any non-urgent questions, you may also contact your provider using MyChart. We now offer e-Visits for anyone 75 and older to request care online for non-urgent symptoms. For details visit [email or web address].   Also download the  MyChart app! Go to the app store, search "MyChart", open the app, select [place], and log in with your MyChart username and password.

## 2023-10-02 NOTE — Telephone Encounter (Signed)
Notified Patient of prior authorization approval for Dronabinol 2.5mg  Capsules. Medication is approved through 03/31/2024. Pharmacy notified and will order medication. No other needs or concerns noted at this time.

## 2023-10-02 NOTE — Progress Notes (Signed)
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 previously receiving radiation therapy. Dr. Kathrynn Running may reconsider radiation therapy if there is any clear progression of disease.  -will forward noted to Dr. Francine Graven to f/u on our previous discussion to consider bronchoscopy -Continue to follow with pulmonologist to optimize underlying lung capacity  -discussed that there may be a role for his pulmonologist to consider treatment to help break up his mucus/phlegm and allow him to cough it up better. -advised patient to use Boost as a supplement and not as a meal replacement -will plan for repeat CT scan prior to next visit  -answered all of patient's questions in detail  FOLLOW-UP: ***  The total time spent in the appointment was *** minutes* .  All of the patient's questions were answered with apparent satisfaction. The patient knows to call the clinic with any problems, questions or concerns.   Wyvonnia Lora MD MS AAHIVMS [facility] Hematology/Oncology Physician [facility]  .*Total Encounter Time as defined by the Centers for Medicare and Medicaid  Services includes, in addition to the face-to-face time of a patient visit (documented in the note above) non-face-to-face time: obtaining and reviewing outside history, ordering and reviewing medications, tests or procedures, care coordination (communications with other health care professionals or caregivers) and documentation in the medical record.    I,Mitra Faeizi,acting as a Neurosurgeon for Wyvonnia Lora, MD.,have documented all relevant documentation on the behalf of Wyvonnia Lora, MD,as directed by  Wyvonnia Lora, MD while in the presence of Wyvonnia Lora, MD.  ***

## 2023-10-04 LAB — T4: T4, Total: 8.5 ug/dL (ref 4.5–12.0)

## 2023-10-07 ENCOUNTER — Other Ambulatory Visit: Payer: Self-pay | Admitting: Hematology

## 2023-10-08 ENCOUNTER — Other Ambulatory Visit: Payer: Self-pay

## 2023-10-08 DIAGNOSIS — C3492 Malignant neoplasm of unspecified part of left bronchus or lung: Secondary | ICD-10-CM

## 2023-10-09 ENCOUNTER — Other Ambulatory Visit: Payer: Self-pay

## 2023-10-09 ENCOUNTER — Encounter: Payer: Self-pay | Admitting: Hematology

## 2023-10-09 MED ORDER — FENTANYL 50 MCG/HR TD PT72: 1.0000 | MEDICATED_PATCH | TRANSDERMAL | 0 refills | Status: DC

## 2023-10-09 MED ORDER — MORPHINE SULFATE 15 MG PO TABS: 15.0000 mg | ORAL_TABLET | ORAL | 0 refills | Status: DC | PRN

## 2023-10-20 ENCOUNTER — Telehealth: Payer: Self-pay | Admitting: Hematology

## 2023-10-20 NOTE — Telephone Encounter (Signed)
Spoke with patient wife confirming upcoming appointment

## 2023-10-21 ENCOUNTER — Other Ambulatory Visit: Payer: Self-pay

## 2023-10-21 DIAGNOSIS — C7951 Secondary malignant neoplasm of bone: Secondary | ICD-10-CM

## 2023-10-21 DIAGNOSIS — C3492 Malignant neoplasm of unspecified part of left bronchus or lung: Secondary | ICD-10-CM

## 2023-10-22 ENCOUNTER — Other Ambulatory Visit: Payer: Self-pay | Admitting: Hematology

## 2023-10-22 ENCOUNTER — Inpatient Hospital Stay: Payer: Medicare Other

## 2023-10-22 ENCOUNTER — Encounter: Payer: Self-pay | Admitting: Hematology

## 2023-10-22 ENCOUNTER — Inpatient Hospital Stay: Payer: Medicare Other | Attending: Hematology

## 2023-10-22 VITALS — BP 107/63 | HR 66 | Temp 98.1°F | Resp 18 | Wt 112.0 lb

## 2023-10-22 DIAGNOSIS — Z7189 Other specified counseling: Secondary | ICD-10-CM

## 2023-10-22 DIAGNOSIS — F17210 Nicotine dependence, cigarettes, uncomplicated: Secondary | ICD-10-CM | POA: Insufficient documentation

## 2023-10-22 DIAGNOSIS — Z5112 Encounter for antineoplastic immunotherapy: Secondary | ICD-10-CM | POA: Insufficient documentation

## 2023-10-22 DIAGNOSIS — Z8546 Personal history of malignant neoplasm of prostate: Secondary | ICD-10-CM | POA: Diagnosis not present

## 2023-10-22 DIAGNOSIS — G893 Neoplasm related pain (acute) (chronic): Secondary | ICD-10-CM | POA: Insufficient documentation

## 2023-10-22 DIAGNOSIS — C7951 Secondary malignant neoplasm of bone: Secondary | ICD-10-CM

## 2023-10-22 DIAGNOSIS — C3492 Malignant neoplasm of unspecified part of left bronchus or lung: Secondary | ICD-10-CM

## 2023-10-22 DIAGNOSIS — C3412 Malignant neoplasm of upper lobe, left bronchus or lung: Secondary | ICD-10-CM | POA: Diagnosis present

## 2023-10-22 DIAGNOSIS — Z95828 Presence of other vascular implants and grafts: Secondary | ICD-10-CM

## 2023-10-22 DIAGNOSIS — Z79899 Other long term (current) drug therapy: Secondary | ICD-10-CM | POA: Insufficient documentation

## 2023-10-22 LAB — CMP (CANCER CENTER ONLY)
ALT: 15 U/L (ref 0–44)
AST: 19 U/L (ref 15–41)
Albumin: 3.4 g/dL — ABNORMAL LOW (ref 3.5–5.0)
Alkaline Phosphatase: 59 U/L (ref 38–126)
Anion gap: 2 — ABNORMAL LOW (ref 5–15)
BUN: 23 mg/dL (ref 8–23)
CO2: 33 mmol/L — ABNORMAL HIGH (ref 22–32)
Calcium: 9.1 mg/dL (ref 8.9–10.3)
Chloride: 103 mmol/L (ref 98–111)
Creatinine: 0.83 mg/dL (ref 0.61–1.24)
GFR, Estimated: >60 mL/min (ref >60)
Glucose, Bld: 91 mg/dL (ref 70–99)
Potassium: 4.2 mmol/L (ref 3.5–5.1)
Sodium: 138 mmol/L (ref 135–145)
Total Bilirubin: 0.4 mg/dL (ref 0.0–1.2)
Total Protein: 6.6 g/dL (ref 6.5–8.1)

## 2023-10-22 LAB — CBC WITH DIFFERENTIAL (CANCER CENTER ONLY)
Abs Immature Granulocytes: 0.04 10*3/uL (ref 0.00–0.07)
Basophils Absolute: 0 10*3/uL (ref 0.0–0.1)
Basophils Relative: 0 %
Eosinophils Absolute: 0.1 10*3/uL (ref 0.0–0.5)
Eosinophils Relative: 1 %
HCT: 31 % — ABNORMAL LOW (ref 39.0–52.0)
Hemoglobin: 10.2 g/dL — ABNORMAL LOW (ref 13.0–17.0)
Immature Granulocytes: 1 %
Lymphocytes Relative: 5 %
Lymphs Abs: 0.4 10*3/uL — ABNORMAL LOW (ref 0.7–4.0)
MCH: 31.8 pg (ref 26.0–34.0)
MCHC: 32.9 g/dL (ref 30.0–36.0)
MCV: 96.6 fL (ref 80.0–100.0)
Monocytes Absolute: 0.6 10*3/uL (ref 0.1–1.0)
Monocytes Relative: 8 %
Neutro Abs: 6.5 10*3/uL (ref 1.7–7.7)
Neutrophils Relative %: 85 %
Platelet Count: 174 10*3/uL (ref 150–400)
RBC: 3.21 MIL/uL — ABNORMAL LOW (ref 4.22–5.81)
RDW: 14.1 % (ref 11.5–15.5)
WBC Count: 7.7 10*3/uL (ref 4.0–10.5)
nRBC: 0 % (ref 0.0–0.2)

## 2023-10-22 LAB — TSH: TSH: 1.643 u[IU]/mL (ref 0.350–4.500)

## 2023-10-22 MED ORDER — HEPARIN SOD (PORK) LOCK FLUSH 100 UNIT/ML IV SOLN
500.0000 [IU] | Freq: Once | INTRAVENOUS | Status: AC | PRN
  Administered 2023-10-22: 500 [IU]

## 2023-10-22 MED ORDER — SODIUM CHLORIDE 0.9% FLUSH
10.0000 mL | INTRAVENOUS | Status: DC | PRN
  Administered 2023-10-22: 10 mL

## 2023-10-22 MED ORDER — SODIUM CHLORIDE 0.9% FLUSH
10.0000 mL | Freq: Once | INTRAVENOUS | Status: AC
  Administered 2023-10-22: 10 mL

## 2023-10-22 MED ORDER — SODIUM CHLORIDE 0.9 % IV SOLN
200.0000 mg | Freq: Once | INTRAVENOUS | Status: AC
  Administered 2023-10-22: 200 mg via INTRAVENOUS
  Filled 2023-10-22: qty 200

## 2023-10-22 MED ORDER — DIPHENHYDRAMINE HCL 25 MG PO CAPS
25.0000 mg | ORAL_CAPSULE | Freq: Once | ORAL | Status: AC
  Administered 2023-10-22: 25 mg via ORAL
  Filled 2023-10-22: qty 1

## 2023-10-22 MED ORDER — FAMOTIDINE 20 MG PO TABS
20.0000 mg | ORAL_TABLET | Freq: Once | ORAL | Status: AC
Start: 2023-10-22 — End: 2023-10-22
  Administered 2023-10-22: 20 mg via ORAL
  Filled 2023-10-22: qty 1

## 2023-10-22 MED ORDER — SODIUM CHLORIDE 0.9 % IV SOLN: Freq: Once | INTRAVENOUS | Status: AC

## 2023-10-22 NOTE — Patient Instructions (Signed)

## 2023-10-23 LAB — T4: T4, Total: 8.7 ug/dL (ref 4.5–12.0)

## 2023-10-28 ENCOUNTER — Other Ambulatory Visit: Payer: Self-pay | Admitting: Hematology

## 2023-11-03 ENCOUNTER — Encounter: Payer: Self-pay | Admitting: Hematology

## 2023-11-03 ENCOUNTER — Encounter (HOSPITAL_BASED_OUTPATIENT_CLINIC_OR_DEPARTMENT_OTHER): Payer: Self-pay

## 2023-11-03 ENCOUNTER — Ambulatory Visit (HOSPITAL_BASED_OUTPATIENT_CLINIC_OR_DEPARTMENT_OTHER): Payer: Medicare Other | Admitting: *Deleted

## 2023-11-03 DIAGNOSIS — Z0000 Encounter for general adult medical examination without abnormal findings: Secondary | ICD-10-CM
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
## 2023-11-03 NOTE — Patient Instructions (Signed)
 [name] , Thank you for taking time to come for your Medicare Wellness Visit. I appreciate your ongoing commitment to your health goals. Please review the following plan we discussed and let me know if I can assist you in the future.   Screening recommendations/referrals: Colonoscopy: no longer required Recommended yearly ophthalmology/optometry visit for glaucoma screening and checkup Recommended yearly dental visit for hygiene and checkup  Vaccinations: Influenza vaccine: Education provided Pneumococcal vaccine: up to date Tdap vaccine: Education provided Shingles vaccine: up to date    Advanced directives: up to date     Preventive Care 65 Years and Older, Male Preventive care refers to lifestyle choices and visits with your health care provider that can promote health and wellness. What does preventive care include? A yearly physical exam. This is also called an annual well check. Dental exams once or twice a year. Routine eye exams. Ask your health care provider how often you should have your eyes checked. Personal lifestyle choices, including: Daily care of your teeth and gums. Regular physical activity. Eating a healthy diet. Avoiding tobacco and drug use. Limiting alcohol use. Practicing safe sex. Taking low doses of aspirin every day. Taking vitamin and mineral supplements as recommended by your health care provider. What happens during an annual well check? The services and screenings done by your health care provider during your annual well check will depend on your age, overall health, lifestyle risk factors, and family history of disease. Counseling  Your health care provider may ask you questions about your: Alcohol use. Tobacco use. Drug use. Emotional well-being. Home and relationship well-being. Sexual activity. Eating habits. History of falls. Memory and ability to understand (cognition). Work and work Astronomer. Screening  You may have the  following tests or measurements: Height, weight, and BMI. Blood pressure. Lipid and cholesterol levels. These may be checked every 5 years, or more frequently if you are over 61 years old. Skin check. Lung cancer screening. You may have this screening every year starting at age 30 if you have a 30-pack-year history of smoking and currently smoke or have quit within the past 15 years. Fecal occult blood test (FOBT) of the stool. You may have this test every year starting at age 36. Flexible sigmoidoscopy or colonoscopy. You may have a sigmoidoscopy every 5 years or a colonoscopy every 10 years starting at age 20. Prostate cancer screening. Recommendations will vary depending on your family history and other risks. Hepatitis C blood test. Hepatitis B blood test. Sexually transmitted disease (STD) testing. Diabetes screening. This is done by checking your blood sugar (glucose) after you have not eaten for a while (fasting). You may have this done every 1-3 years. Abdominal aortic aneurysm (AAA) screening. You may need this if you are a current or former smoker. Osteoporosis. You may be screened starting at age 7 if you are at high risk. Talk with your health care provider about your test results, treatment options, and if necessary, the need for more tests. Vaccines  Your health care provider may recommend certain vaccines, such as: Influenza vaccine. This is recommended every year. Tetanus, diphtheria, and acellular pertussis (Tdap, Td) vaccine. You may need a Td booster every 10 years. Zoster vaccine. You may need this after age 34. Pneumococcal 13-valent conjugate (PCV13) vaccine. One dose is recommended after age 33. Pneumococcal polysaccharide (PPSV23) vaccine. One dose is recommended after age 16. Talk to your health care provider about which screenings and vaccines you need and how often you need them.  This information is not intended to replace advice given to you by your health care  provider. Make sure you discuss any questions you have with your health care provider. Document Released: 09/21/2015 Document Revised: 05/14/2016 Document Reviewed: 06/26/2015 Elsevier Interactive Patient Education  2017 [employer].  Fall Prevention in the Home Falls can cause injuries. They can happen to people of all ages. There are many things you can do to make your home safe and to help prevent falls. What can I do on the outside of my home? Regularly fix the edges of walkways and driveways and fix any cracks. Remove anything that might make you trip as you walk through a door, such as a raised step or threshold. Trim any bushes or trees on the path to your home. Use bright outdoor lighting. Clear any walking paths of anything that might make someone trip, such as rocks or tools. Regularly check to see if handrails are loose or broken. Make sure that both sides of any steps have handrails. Any raised decks and porches should have guardrails on the edges. Have any leaves, snow, or ice cleared regularly. Use sand or salt on walking paths during winter. Clean up any spills in your garage right away. This includes oil or grease spills. What can I do in the bathroom? Use night lights. Install grab bars by the toilet and in the tub and shower. Do not use towel bars as grab bars. Use non-skid mats or decals in the tub or shower. If you need to sit down in the shower, use a plastic, non-slip stool. Keep the floor dry. Clean up any water that spills on the floor as soon as it happens. Remove soap buildup in the tub or shower regularly. Attach bath mats securely with double-sided non-slip rug tape. Do not have throw rugs and other things on the floor that can make you trip. What can I do in the bedroom? Use night lights. Make sure that you have a light by your bed that is easy to reach. Do not use any sheets or blankets that are too big for your bed. They should not hang down onto the  floor. Have a firm chair that has side arms. You can use this for support while you get dressed. Do not have throw rugs and other things on the floor that can make you trip. What can I do in the kitchen? Clean up any spills right away. Avoid walking on wet floors. Keep items that you use a lot in easy-to-reach places. If you need to reach something above you, use a strong step stool that has a grab bar. Keep electrical cords out of the way. Do not use floor polish or wax that makes floors slippery. If you must use wax, use non-skid floor wax. Do not have throw rugs and other things on the floor that can make you trip. What can I do with my stairs? Do not leave any items on the stairs. Make sure that there are handrails on both sides of the stairs and use them. Fix handrails that are broken or loose. Make sure that handrails are as long as the stairways. Check any carpeting to make sure that it is firmly attached to the stairs. Fix any carpet that is loose or worn. Avoid having throw rugs at the top or bottom of the stairs. If you do have throw rugs, attach them to the floor with carpet tape. Make sure that you have a light switch at the  top of the stairs and the bottom of the stairs. If you do not have them, ask someone to add them for you. What else can I do to help prevent falls? Wear shoes that: Do not have high heels. Have rubber bottoms. Are comfortable and fit you well. Are closed at the toe. Do not wear sandals. If you use a stepladder: Make sure that it is fully opened. Do not climb a closed stepladder. Make sure that both sides of the stepladder are locked into place. Ask someone to hold it for you, if possible. Clearly mark and make sure that you can see: Any grab bars or handrails. First and last steps. Where the edge of each step is. Use tools that help you move around (mobility aids) if they are needed. These include: Canes. Walkers. Scooters. Crutches. Turn on the  lights when you go into a dark area. Replace any light bulbs as soon as they burn out. Set up your furniture so you have a clear path. Avoid moving your furniture around. If any of your floors are uneven, fix them. If there are any pets around you, be aware of where they are. Review your medicines with your doctor. Some medicines can make you feel dizzy. This can increase your chance of falling. Ask your doctor what other things that you can do to help prevent falls. This information is not intended to replace advice given to you by your health care provider. Make sure you discuss any questions you have with your health care provider. Document Released: 06/21/2009 Document Revised: 01/31/2016 Document Reviewed: 09/29/2014 Elsevier Interactive Patient Education  2017 [employer].

## 2023-11-04 ENCOUNTER — Other Ambulatory Visit: Payer: Self-pay

## 2023-11-05 ENCOUNTER — Ambulatory Visit (INDEPENDENT_AMBULATORY_CARE_PROVIDER_SITE_OTHER): Payer: No Typology Code available for payment source | Admitting: Pulmonary Disease

## 2023-11-05 ENCOUNTER — Encounter: Payer: Self-pay | Admitting: Pulmonary Disease

## 2023-11-05 ENCOUNTER — Other Ambulatory Visit: Payer: Self-pay

## 2023-11-05 VITALS — BP 106/65 | HR 74 | Ht 70.0 in | Wt 112.6 lb

## 2023-11-05 DIAGNOSIS — F17210 Nicotine dependence, cigarettes, uncomplicated: Secondary | ICD-10-CM

## 2023-11-05 DIAGNOSIS — J439 Emphysema, unspecified: Secondary | ICD-10-CM | POA: Diagnosis not present

## 2023-11-05 DIAGNOSIS — J441 Chronic obstructive pulmonary disease with (acute) exacerbation: Secondary | ICD-10-CM

## 2023-11-05 DIAGNOSIS — J309 Allergic rhinitis, unspecified: Secondary | ICD-10-CM

## 2023-11-05 DIAGNOSIS — J4489 Other specified chronic obstructive pulmonary disease: Secondary | ICD-10-CM

## 2023-11-05 DIAGNOSIS — C3492 Malignant neoplasm of unspecified part of left bronchus or lung: Secondary | ICD-10-CM

## 2023-11-05 MED ORDER — IPRATROPIUM-ALBUTEROL 0.5-2.5 (3) MG/3ML IN SOLN: 3.0000 mL | Freq: Four times a day (QID) | RESPIRATORY_TRACT | 3 refills | Status: AC | PRN

## 2023-11-05 MED ORDER — AZITHROMYCIN 250 MG PO TABS: ORAL_TABLET | ORAL | 0 refills | Status: DC

## 2023-11-05 MED ORDER — PREDNISONE 10 MG PO TABS
ORAL_TABLET | ORAL | 0 refills | Status: AC
Start: 2023-11-05 — End: 2023-11-17

## 2023-11-05 MED ORDER — FLUTICASONE PROPIONATE 50 MCG/ACT NA SUSP: 1.0000 | Freq: Every day | NASAL | 6 refills | Status: DC

## 2023-11-05 MED ORDER — AMOXICILLIN-POT CLAVULANATE 875-125 MG PO TABS: 1.0000 | ORAL_TABLET | Freq: Two times a day (BID) | ORAL | 0 refills | Status: DC

## 2023-11-05 MED ORDER — BREZTRI AEROSPHERE 160-9-4.8 MCG/ACT IN AERO: 2.0000 | INHALATION_SPRAY | Freq: Two times a day (BID) | RESPIRATORY_TRACT | 11 refills | Status: DC

## 2023-11-05 MED ORDER — ALBUTEROL SULFATE HFA 108 (90 BASE) MCG/ACT IN AERS
1.0000 | INHALATION_SPRAY | Freq: Four times a day (QID) | RESPIRATORY_TRACT | 3 refills | Status: DC | PRN
Start: 2023-11-05 — End: 2024-07-01
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
## 2023-11-05 NOTE — Patient Instructions (Addendum)
 Start augmentin 1 tab twice daily for 14 days  Start zpak antibiotic for 5 days  Start prednisone taper 40mg  x 3 days 30mg  x 3 days 20mg  x 3 days 10mg  x 3 days  Continue breztri inhaler 2 puffs twice daily  Continue albuterol inhaler or duoneb nebulizer treatment as needed  Follow up in 6 months, call sooner if needed

## 2023-11-08 ENCOUNTER — Encounter: Payer: Self-pay | Admitting: Hematology

## 2023-11-08 MED ORDER — FENTANYL 50 MCG/HR TD PT72: 1.0000 | MEDICATED_PATCH | TRANSDERMAL | 0 refills | Status: DC

## 2023-11-08 MED ORDER — MORPHINE SULFATE 15 MG PO TABS: 15.0000 mg | ORAL_TABLET | Freq: Four times a day (QID) | ORAL | 0 refills | Status: DC | PRN

## 2023-11-11 ENCOUNTER — Inpatient Hospital Stay (HOSPITAL_BASED_OUTPATIENT_CLINIC_OR_DEPARTMENT_OTHER): Payer: Medicare Other | Admitting: Hematology

## 2023-11-11 ENCOUNTER — Inpatient Hospital Stay: Payer: Medicare Other | Attending: Hematology

## 2023-11-11 ENCOUNTER — Encounter: Payer: Self-pay | Admitting: Hematology

## 2023-11-11 ENCOUNTER — Inpatient Hospital Stay: Payer: Medicare Other

## 2023-11-11 VITALS — BP 109/62 | HR 78 | Temp 97.5°F | Resp 18 | Ht 70.0 in | Wt 112.5 lb

## 2023-11-11 DIAGNOSIS — F17210 Nicotine dependence, cigarettes, uncomplicated: Secondary | ICD-10-CM | POA: Diagnosis not present

## 2023-11-11 DIAGNOSIS — G893 Neoplasm related pain (acute) (chronic): Secondary | ICD-10-CM | POA: Insufficient documentation

## 2023-11-11 DIAGNOSIS — Z7189 Other specified counseling: Secondary | ICD-10-CM

## 2023-11-11 DIAGNOSIS — Z8546 Personal history of malignant neoplasm of prostate: Secondary | ICD-10-CM | POA: Insufficient documentation

## 2023-11-11 DIAGNOSIS — C3492 Malignant neoplasm of unspecified part of left bronchus or lung: Secondary | ICD-10-CM

## 2023-11-11 DIAGNOSIS — C7951 Secondary malignant neoplasm of bone: Secondary | ICD-10-CM | POA: Insufficient documentation

## 2023-11-11 DIAGNOSIS — C3412 Malignant neoplasm of upper lobe, left bronchus or lung: Secondary | ICD-10-CM

## 2023-11-11 DIAGNOSIS — Z5112 Encounter for antineoplastic immunotherapy: Secondary | ICD-10-CM

## 2023-11-11 DIAGNOSIS — Z79899 Other long term (current) drug therapy: Secondary | ICD-10-CM | POA: Insufficient documentation

## 2023-11-11 DIAGNOSIS — Z8551 Personal history of malignant neoplasm of bladder: Secondary | ICD-10-CM | POA: Diagnosis not present

## 2023-11-11 DIAGNOSIS — Z95828 Presence of other vascular implants and grafts: Secondary | ICD-10-CM

## 2023-11-11 DIAGNOSIS — Z923 Personal history of irradiation: Secondary | ICD-10-CM | POA: Insufficient documentation

## 2023-11-11 LAB — CBC WITH DIFFERENTIAL (CANCER CENTER ONLY)
Abs Immature Granulocytes: 0.03 10*3/uL (ref 0.00–0.07)
Basophils Absolute: 0 10*3/uL (ref 0.0–0.1)
Basophils Relative: 0 %
Eosinophils Absolute: 0 10*3/uL (ref 0.0–0.5)
Eosinophils Relative: 0 %
HCT: 32.8 % — ABNORMAL LOW (ref 39.0–52.0)
Hemoglobin: 10.9 g/dL — ABNORMAL LOW (ref 13.0–17.0)
Immature Granulocytes: 0 %
Lymphocytes Relative: 2 %
Lymphs Abs: 0.2 10*3/uL — ABNORMAL LOW (ref 0.7–4.0)
MCH: 31.9 pg (ref 26.0–34.0)
MCHC: 33.2 g/dL (ref 30.0–36.0)
MCV: 95.9 fL (ref 80.0–100.0)
Monocytes Absolute: 0.3 10*3/uL (ref 0.1–1.0)
Monocytes Relative: 3 %
Neutro Abs: 10 10*3/uL — ABNORMAL HIGH (ref 1.7–7.7)
Neutrophils Relative %: 95 %
Platelet Count: 180 10*3/uL (ref 150–400)
RBC: 3.42 MIL/uL — ABNORMAL LOW (ref 4.22–5.81)
RDW: 14.4 % (ref 11.5–15.5)
WBC Count: 10.6 10*3/uL — ABNORMAL HIGH (ref 4.0–10.5)
nRBC: 0 % (ref 0.0–0.2)

## 2023-11-11 LAB — CMP (CANCER CENTER ONLY)
ALT: 19 U/L (ref 0–44)
AST: 20 U/L (ref 15–41)
Albumin: 3.6 g/dL (ref 3.5–5.0)
Alkaline Phosphatase: 59 U/L (ref 38–126)
Anion gap: 4 — ABNORMAL LOW (ref 5–15)
BUN: 21 mg/dL (ref 8–23)
CO2: 31 mmol/L (ref 22–32)
Calcium: 9 mg/dL (ref 8.9–10.3)
Chloride: 100 mmol/L (ref 98–111)
Creatinine: 0.74 mg/dL (ref 0.61–1.24)
GFR, Estimated: >60 mL/min (ref >60)
Glucose, Bld: 126 mg/dL — ABNORMAL HIGH (ref 70–99)
Potassium: 4.5 mmol/L (ref 3.5–5.1)
Sodium: 135 mmol/L (ref 135–145)
Total Bilirubin: 0.4 mg/dL (ref 0.0–1.2)
Total Protein: 6.8 g/dL (ref 6.5–8.1)

## 2023-11-11 MED ORDER — SODIUM CHLORIDE 0.9% FLUSH
10.0000 mL | Freq: Once | INTRAVENOUS | Status: AC
  Administered 2023-11-11: 10 mL

## 2023-11-11 MED ORDER — FAMOTIDINE 20 MG PO TABS
20.0000 mg | ORAL_TABLET | Freq: Once | ORAL | Status: AC
  Administered 2023-11-11: 20 mg via ORAL
  Filled 2023-11-11: qty 1

## 2023-11-11 MED ORDER — SODIUM CHLORIDE 0.9 % IV SOLN
200.0000 mg | Freq: Once | INTRAVENOUS | Status: AC
  Administered 2023-11-11: 200 mg via INTRAVENOUS
  Filled 2023-11-11: qty 200

## 2023-11-11 MED ORDER — SODIUM CHLORIDE 0.9 % IV SOLN: Freq: Once | INTRAVENOUS | Status: AC

## 2023-11-11 MED ORDER — DIPHENHYDRAMINE HCL 25 MG PO CAPS
25.0000 mg | ORAL_CAPSULE | Freq: Once | ORAL | Status: AC
  Administered 2023-11-11: 25 mg via ORAL
  Filled 2023-11-11: qty 1
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
## 2023-11-11 NOTE — Patient Instructions (Signed)

## 2023-11-17 ENCOUNTER — Encounter: Payer: Self-pay | Admitting: Hematology
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
## 2023-12-02 ENCOUNTER — Inpatient Hospital Stay: Payer: Medicare Other

## 2023-12-02 ENCOUNTER — Inpatient Hospital Stay: Payer: Medicare Other | Admitting: Hematology

## 2023-12-02 ENCOUNTER — Other Ambulatory Visit: Payer: Self-pay | Admitting: Hematology

## 2023-12-02 DIAGNOSIS — C3492 Malignant neoplasm of unspecified part of left bronchus or lung: Secondary | ICD-10-CM

## 2023-12-02 DIAGNOSIS — Z95828 Presence of other vascular implants and grafts: Secondary | ICD-10-CM

## 2023-12-02 DIAGNOSIS — C7951 Secondary malignant neoplasm of bone: Secondary | ICD-10-CM

## 2023-12-02 DIAGNOSIS — Z7189 Other specified counseling: Secondary | ICD-10-CM

## 2023-12-02 DIAGNOSIS — Z5112 Encounter for antineoplastic immunotherapy: Secondary | ICD-10-CM | POA: Diagnosis not present

## 2023-12-02 DIAGNOSIS — C3412 Malignant neoplasm of upper lobe, left bronchus or lung: Secondary | ICD-10-CM

## 2023-12-02 LAB — CBC WITH DIFFERENTIAL (CANCER CENTER ONLY)
Abs Immature Granulocytes: 0.01 10*3/uL (ref 0.00–0.07)
Basophils Absolute: 0 10*3/uL (ref 0.0–0.1)
Basophils Relative: 0 %
Eosinophils Absolute: 0.1 10*3/uL (ref 0.0–0.5)
Eosinophils Relative: 1 %
HCT: 32.3 % — ABNORMAL LOW (ref 39.0–52.0)
Hemoglobin: 10.7 g/dL — ABNORMAL LOW (ref 13.0–17.0)
Immature Granulocytes: 0 %
Lymphocytes Relative: 5 %
Lymphs Abs: 0.3 10*3/uL — ABNORMAL LOW (ref 0.7–4.0)
MCH: 31.5 pg (ref 26.0–34.0)
MCHC: 33.1 g/dL (ref 30.0–36.0)
MCV: 95 fL (ref 80.0–100.0)
Monocytes Absolute: 0.5 10*3/uL (ref 0.1–1.0)
Monocytes Relative: 7 %
Neutro Abs: 6.2 10*3/uL (ref 1.7–7.7)
Neutrophils Relative %: 87 %
Platelet Count: 157 10*3/uL (ref 150–400)
RBC: 3.4 MIL/uL — ABNORMAL LOW (ref 4.22–5.81)
RDW: 14.5 % (ref 11.5–15.5)
WBC Count: 7.1 10*3/uL (ref 4.0–10.5)
nRBC: 0 % (ref 0.0–0.2)

## 2023-12-02 LAB — CMP (CANCER CENTER ONLY)
ALT: 15 U/L (ref 0–44)
AST: 20 U/L (ref 15–41)
Albumin: 3.7 g/dL (ref 3.5–5.0)
Alkaline Phosphatase: 63 U/L (ref 38–126)
Anion gap: 6 (ref 5–15)
BUN: 23 mg/dL (ref 8–23)
CO2: 30 mmol/L (ref 22–32)
Calcium: 9.4 mg/dL (ref 8.9–10.3)
Chloride: 103 mmol/L (ref 98–111)
Creatinine: 0.87 mg/dL (ref 0.61–1.24)
GFR, Estimated: >60 mL/min (ref >60)
Glucose, Bld: 129 mg/dL — ABNORMAL HIGH (ref 70–99)
Potassium: 4.5 mmol/L (ref 3.5–5.1)
Sodium: 139 mmol/L (ref 135–145)
Total Bilirubin: 0.6 mg/dL (ref 0.0–1.2)
Total Protein: 6.9 g/dL (ref 6.5–8.1)

## 2023-12-02 LAB — TSH: TSH: 1.083 u[IU]/mL (ref 0.350–4.500)

## 2023-12-02 MED ORDER — SODIUM CHLORIDE 0.9% FLUSH
10.0000 mL | INTRAVENOUS | Status: DC | PRN
  Administered 2023-12-02: 10 mL

## 2023-12-02 MED ORDER — HEPARIN SOD (PORK) LOCK FLUSH 100 UNIT/ML IV SOLN
500.0000 [IU] | Freq: Once | INTRAVENOUS | Status: AC | PRN
  Administered 2023-12-02: 500 [IU]

## 2023-12-02 MED ORDER — FAMOTIDINE 20 MG PO TABS
20.0000 mg | ORAL_TABLET | Freq: Once | ORAL | Status: AC
  Administered 2023-12-02: 20 mg via ORAL
  Filled 2023-12-02: qty 1

## 2023-12-02 MED ORDER — DIPHENHYDRAMINE HCL 25 MG PO CAPS
25.0000 mg | ORAL_CAPSULE | Freq: Once | ORAL | Status: AC
Start: 2023-12-02 — End: 2023-12-02
  Administered 2023-12-02: 25 mg via ORAL
  Filled 2023-12-02: qty 1

## 2023-12-02 MED ORDER — SODIUM CHLORIDE 0.9 % IV SOLN
200.0000 mg | Freq: Once | INTRAVENOUS | Status: AC
  Administered 2023-12-02: 200 mg via INTRAVENOUS
  Filled 2023-12-02: qty 200

## 2023-12-02 MED ORDER — SODIUM CHLORIDE 0.9 % IV SOLN: Freq: Once | INTRAVENOUS | Status: AC

## 2023-12-02 NOTE — Patient Instructions (Signed)

## 2023-12-03 LAB — T4: T4, Total: 8.5 ug/dL (ref 4.5–12.0)

## 2023-12-08 ENCOUNTER — Other Ambulatory Visit: Payer: Self-pay

## 2023-12-08 DIAGNOSIS — C3492 Malignant neoplasm of unspecified part of left bronchus or lung: Secondary | ICD-10-CM

## 2023-12-08 MED ORDER — MORPHINE SULFATE 15 MG PO TABS
15.0000 mg | ORAL_TABLET | Freq: Four times a day (QID) | ORAL | 0 refills | Status: DC | PRN
Start: 2023-12-08 — End: 2024-01-08

## 2023-12-08 MED ORDER — FENTANYL 50 MCG/HR TD PT72
1.0000 | MEDICATED_PATCH | TRANSDERMAL | 0 refills | Status: DC
Start: 2023-12-08 — End: 2024-01-08

## 2023-12-09 ENCOUNTER — Ambulatory Visit (HOSPITAL_COMMUNITY)
Admission: RE | Admit: 2023-12-09 | Discharge: 2023-12-09 | Disposition: A | Discharge status: Home / Self Care | Source: Ambulatory Visit | Attending: Hematology | Admitting: Hematology

## 2023-12-09 DIAGNOSIS — C3492 Malignant neoplasm of unspecified part of left bronchus or lung: Secondary | ICD-10-CM | POA: Insufficient documentation

## 2023-12-23 ENCOUNTER — Inpatient Hospital Stay: Payer: Medicare Other

## 2023-12-23 ENCOUNTER — Inpatient Hospital Stay: Payer: Medicare Other | Attending: Hematology

## 2023-12-23 ENCOUNTER — Inpatient Hospital Stay (HOSPITAL_BASED_OUTPATIENT_CLINIC_OR_DEPARTMENT_OTHER): Payer: Medicare Other | Admitting: Physician Assistant

## 2023-12-23 VITALS — BP 106/64 | HR 66 | Temp 97.7°F | Resp 18 | Ht 70.0 in | Wt 107.5 lb

## 2023-12-23 DIAGNOSIS — C3492 Malignant neoplasm of unspecified part of left bronchus or lung: Secondary | ICD-10-CM

## 2023-12-23 DIAGNOSIS — C7951 Secondary malignant neoplasm of bone: Secondary | ICD-10-CM | POA: Diagnosis present

## 2023-12-23 DIAGNOSIS — C3412 Malignant neoplasm of upper lobe, left bronchus or lung: Secondary | ICD-10-CM | POA: Insufficient documentation

## 2023-12-23 DIAGNOSIS — Z8551 Personal history of malignant neoplasm of bladder: Secondary | ICD-10-CM | POA: Insufficient documentation

## 2023-12-23 DIAGNOSIS — Z7189 Other specified counseling: Secondary | ICD-10-CM

## 2023-12-23 DIAGNOSIS — Z5112 Encounter for antineoplastic immunotherapy: Secondary | ICD-10-CM | POA: Diagnosis present

## 2023-12-23 DIAGNOSIS — Z923 Personal history of irradiation: Secondary | ICD-10-CM | POA: Insufficient documentation

## 2023-12-23 DIAGNOSIS — Z8546 Personal history of malignant neoplasm of prostate: Secondary | ICD-10-CM | POA: Insufficient documentation

## 2023-12-23 DIAGNOSIS — Z95828 Presence of other vascular implants and grafts: Secondary | ICD-10-CM

## 2023-12-23 LAB — CMP (CANCER CENTER ONLY)
ALT: 20 U/L (ref 0–44)
AST: 21 U/L (ref 15–41)
Albumin: 3.8 g/dL (ref 3.5–5.0)
Alkaline Phosphatase: 68 U/L (ref 38–126)
Anion gap: 4 — ABNORMAL LOW (ref 5–15)
BUN: 26 mg/dL — ABNORMAL HIGH (ref 8–23)
CO2: 30 mmol/L (ref 22–32)
Calcium: 9.3 mg/dL (ref 8.9–10.3)
Chloride: 105 mmol/L (ref 98–111)
Creatinine: 0.83 mg/dL (ref 0.61–1.24)
GFR, Estimated: >60 mL/min (ref >60)
Glucose, Bld: 107 mg/dL — ABNORMAL HIGH (ref 70–99)
Potassium: 4.4 mmol/L (ref 3.5–5.1)
Sodium: 139 mmol/L (ref 135–145)
Total Bilirubin: 0.4 mg/dL (ref 0.0–1.2)
Total Protein: 6.9 g/dL (ref 6.5–8.1)

## 2023-12-23 LAB — CBC WITH DIFFERENTIAL (CANCER CENTER ONLY)
Abs Immature Granulocytes: 0.03 10*3/uL (ref 0.00–0.07)
Basophils Absolute: 0 10*3/uL (ref 0.0–0.1)
Basophils Relative: 0 %
Eosinophils Absolute: 0.1 10*3/uL (ref 0.0–0.5)
Eosinophils Relative: 1 %
HCT: 33.3 % — ABNORMAL LOW (ref 39.0–52.0)
Hemoglobin: 11.2 g/dL — ABNORMAL LOW (ref 13.0–17.0)
Immature Granulocytes: 0 %
Lymphocytes Relative: 4 %
Lymphs Abs: 0.4 10*3/uL — ABNORMAL LOW (ref 0.7–4.0)
MCH: 31.2 pg (ref 26.0–34.0)
MCHC: 33.6 g/dL (ref 30.0–36.0)
MCV: 92.8 fL (ref 80.0–100.0)
Monocytes Absolute: 0.6 10*3/uL (ref 0.1–1.0)
Monocytes Relative: 7 %
Neutro Abs: 8.3 10*3/uL — ABNORMAL HIGH (ref 1.7–7.7)
Neutrophils Relative %: 88 %
Platelet Count: 159 10*3/uL (ref 150–400)
RBC: 3.59 MIL/uL — ABNORMAL LOW (ref 4.22–5.81)
RDW: 14.3 % (ref 11.5–15.5)
WBC Count: 9.4 10*3/uL (ref 4.0–10.5)
nRBC: 0 % (ref 0.0–0.2)

## 2023-12-23 MED ORDER — SODIUM CHLORIDE 0.9 % IV SOLN: Freq: Once | INTRAVENOUS | Status: AC

## 2023-12-23 MED ORDER — DIPHENHYDRAMINE HCL 25 MG PO CAPS
25.0000 mg | ORAL_CAPSULE | Freq: Once | ORAL | Status: AC
Start: 2023-12-23 — End: 2023-12-23
  Administered 2023-12-23: 25 mg via ORAL

## 2023-12-23 MED ORDER — SODIUM CHLORIDE 0.9% FLUSH
10.0000 mL | Freq: Once | INTRAVENOUS | Status: AC
  Administered 2023-12-23: 10 mL

## 2023-12-23 MED ORDER — SODIUM CHLORIDE 0.9% FLUSH
10.0000 mL | INTRAVENOUS | Status: DC | PRN
  Administered 2023-12-23: 10 mL

## 2023-12-23 MED ORDER — HEPARIN SOD (PORK) LOCK FLUSH 100 UNIT/ML IV SOLN
500.0000 [IU] | Freq: Once | INTRAVENOUS | Status: AC | PRN
  Administered 2023-12-23: 500 [IU]

## 2023-12-23 MED ORDER — FAMOTIDINE 20 MG PO TABS
20.0000 mg | ORAL_TABLET | Freq: Once | ORAL | Status: AC
  Administered 2023-12-23: 20 mg via ORAL

## 2023-12-23 MED ORDER — SODIUM CHLORIDE 0.9 % IV SOLN
200.0000 mg | Freq: Once | INTRAVENOUS | Status: AC
  Administered 2023-12-23: 200 mg via INTRAVENOUS
  Filled 2023-12-23: qty 200
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
## 2023-12-29 ENCOUNTER — Ambulatory Visit: Payer: Medicare Other | Attending: Cardiovascular Disease | Admitting: Cardiovascular Disease

## 2023-12-29 ENCOUNTER — Ambulatory Visit: Payer: Medicare Other | Admitting: Cardiovascular Disease

## 2023-12-29 ENCOUNTER — Encounter: Payer: Self-pay | Admitting: Hematology

## 2023-12-29 VITALS — BP 80/40 | HR 79 | Ht 70.0 in | Wt 108.0 lb

## 2023-12-29 DIAGNOSIS — E782 Mixed hyperlipidemia: Secondary | ICD-10-CM

## 2023-12-29 DIAGNOSIS — I452 Bifascicular block: Secondary | ICD-10-CM

## 2023-12-29 DIAGNOSIS — Z720 Tobacco use: Secondary | ICD-10-CM

## 2023-12-29 DIAGNOSIS — I2510 Atherosclerotic heart disease of native coronary artery without angina pectoris: Secondary | ICD-10-CM | POA: Diagnosis not present

## 2023-12-29 DIAGNOSIS — Z9861 Coronary angioplasty status: Secondary | ICD-10-CM | POA: Diagnosis not present

## 2023-12-29 DIAGNOSIS — I10 Essential (primary) hypertension: Secondary | ICD-10-CM

## 2023-12-29 MED ORDER — METOPROLOL TARTRATE 25 MG PO TABS: 12.5000 mg | ORAL_TABLET | Freq: Every day | ORAL | 3 refills | Status: AC

## 2023-12-29 NOTE — Patient Instructions (Signed)
 Medication Instructions:  Your physician has recommended you make the following change in your medication:   -Decrease metoprolol  tartrate (lopressor ) 12.5mg  once daily.  *If you need a refill on your cardiac medications before your next appointment, please call your pharmacy*  Follow-Up: At [facility], you and your health needs are our priority.  As part of our continuing mission to provide you with exceptional heart care, our providers are all part of one team.  This team includes your primary Cardiologist (physician) and Advanced Practice Providers or APPs (Physician Assistants and Nurse Practitioners) who all work together to provide you with the care you need, when you need it.  Your next appointment:   12 month(s)  Provider:   Lauro Portal, MD     We recommend signing up for the patient portal called "MyChart".  Sign up information is provided on this After Visit Summary.  MyChart is used to connect with patients for Virtual Visits (Telemedicine).  Patients are able to view lab/test results, encounter notes, upcoming appointments, etc.  Non-urgent messages can be sent to your provider as well.   To learn more about what you can do with MyChart, go to [email or web address].   Other Instructions       1st Floor: - Lobby - Registration  - Pharmacy  - Lab - Cafe  2nd Floor: - PV Lab - Diagnostic Testing (echo, CT, nuclear med)  3rd Floor: - Vacant  4th Floor: - TCTS (cardiothoracic surgery) - AFib Clinic - Structural Heart Clinic - Vascular Surgery  - Vascular Ultrasound  5th Floor: - HeartCare Cardiology (general and EP) - Clinical Pharmacy for coumadin, hypertension, lipid, weight-loss medications, and med management appointments    Valet parking services will be available as well.

## 2023-12-29 NOTE — Assessment & Plan Note (Signed)
 History of CAD status post non-STEMI 06/30/2008.  I catheterized him and stented his AV groove circumflex marginal branch bifurcation using driver bare-metal stents.  He has been asymptomatic since.

## 2023-12-29 NOTE — Assessment & Plan Note (Signed)
 Ongoing tobacco abuse with 3 to 4 cigarettes a day.

## 2023-12-29 NOTE — Progress Notes (Signed)
 12/29/2023 VINT POLA   1946-06-10  [id number]  Primary Physician [name], [name], MD Primary Cardiologist: Avanell Leigh MD FACP, [place], [place], [place]  HPI:  Kyle Foster is a 78 y.o.   fit-appearing married Caucasian male, father of 3 and grandfather of 3 grandchildren, whom I last saw in the office 12/30/2022.Aaron Aas  He has a history of CAD status post non-ST-segment elevation myocardial infarction June 30, 2008. They catheterized him and stented his AV-groove circumflex and marginal branch bifurcation with Driver bare-metal stents. He did have tandem 70% lesions in his RCA and normal LV function. He has been asymptomatic since. He continues to smoke 5 cigarettes a day. Patient is positive for hypertension and hyperlipidemia.   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  He has had radiation therapy, chemotherapy and now is on immunotherapy.     Since I saw him a year he still gets immunotherapy for his squamous cell lung cancer.  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  He was getting radiation therapy for this.  He continues to smoke 3-4 cigarettes a day.  He denies chest pain but does get short of breath on occasion.  Current Meds  Medication Sig   acetaminophen  (TYLENOL ) 500 MG tablet Take 1,000 mg by mouth every 8 (eight) hours as needed for moderate pain.   albuterol  (VENTOLIN  HFA) 108 (90 Base) MCG/ACT inhaler Inhale 1 puff into the lungs every 6 (six) hours as needed for wheezing or shortness of breath.   amoxicillin -clavulanate (AUGMENTIN ) 875-125 MG tablet Take 1 tablet by mouth 2 (two) times daily.   atorvastatin  (LIPITOR ) 80 MG tablet TAKE 1 TABLET BY MOUTH EVERY DAY   azithromycin  (ZITHROMAX ) 250 MG tablet Take as directed   B Complex-C (B-COMPLEX WITH VITAMIN C) tablet Take 1 tablet by mouth daily.   Budeson-Glycopyrrol-Formoterol (BREZTRI  AEROSPHERE) 160-9-4.8 MCG/ACT AERO Inhale 2  puffs into the lungs in the morning and at bedtime.   calcium  carbonate (TUMS - DOSED IN MG ELEMENTAL CALCIUM ) 500 MG chewable tablet Chew 1 tablet (200 mg of elemental calcium  total) by mouth 3 (three) times daily with meals. (Patient taking differently: Chew 1 tablet by mouth 3 (three) times daily as needed for indigestion.)   Cholecalciferol  (VITAMIN D ) 2000 UNITS tablet Take 2,000 Units by mouth daily.   clopidogrel  (PLAVIX ) 75 MG tablet Take 1 tablet (75 mg total) by mouth daily.   dronabinol  (MARINOL ) 2.5 MG capsule Take 1 capsule (2.5 mg total) by mouth 2 (two) times daily before a meal.   DULoxetine  (CYMBALTA ) 30 MG capsule TAKE 2 CAPSULES BY MOUTH EVERY DAY   fentaNYL  (DURAGESIC ) 50 MCG/HR Place 1 patch onto the skin every 3 (three) days.   fluticasone  (FLONASE ) 50 MCG/ACT nasal spray Place 1 spray into both nostrils daily.   Hypromellose (ARTIFICIAL TEARS OP) Place 1 drop into both eyes as needed.   ipratropium-albuterol  (DUONEB) 0.5-2.5 (3) MG/3ML SOLN Take 3 mLs by nebulization 4 (four) times daily as needed.   Lactobacillus (PROBIOTIC GOLD EXTRA STRENGTH) CAPS Take 1 capsule by mouth daily.   lidocaine -prilocaine  (EMLA ) cream Apply 1 application topically as needed (access port).   loratadine  (CLARITIN ) 10 MG tablet Take 10 mg by mouth daily as needed for allergies.   magnesium  oxide (MAG-OX) 400 MG tablet Take 1 tablet (400 mg total) by mouth daily.   metoprolol  tartrate (LOPRESSOR ) 25 MG tablet Take 0.5  tablets (12.5 mg total) by mouth 2 (two) times daily.   morphine  (MSIR) 15 MG tablet Take 1 tablet (15 mg total) by mouth every 6 (six) hours as needed for moderate pain (pain score 4-6) or severe pain (pain score 7-10).   ondansetron  (ZOFRAN ) 8 MG tablet Take 1 tablet (8 mg total) by mouth 3 (three) times daily as needed.   pantoprazole  (PROTONIX ) 40 MG tablet TAKE 1 TABLET BY MOUTH EVERY DAY   Polyvinyl Alcohol  (TEARS AGAIN OP) Place 1 drop into the left eye nightly.   tamsulosin   (FLOMAX ) 0.4 MG CAPS capsule Take 1 capsule (0.4 mg total) by mouth daily after supper.     Allergies  Allergen Reactions   Codeine Other (See Comments)    HEADACHE    Social History   Socioeconomic History   Marital status: Married    Spouse name: Not on file   Number of children: Not on file   Years of education: Not on file   Highest education level: Associate degree: occupational, Scientist, product/process development, or vocational program  Occupational History   Not on file  Tobacco Use   Smoking status: Every Day    Current packs/day: 0.50    Average packs/day: 0.5 packs/day for 57.3 years (28.7 ttl pk-yrs)    Types: Cigarettes    Start date: 1968    Passive exposure: Current   Smokeless tobacco: Never   Tobacco comments:    smokes 5-6 cigarettes a day Updated 08/03/2023 TJ, CMA  Vaping Use   Vaping status: Never Used  Substance and Sexual Activity   Alcohol  use: Not Currently   Drug use: Never   Sexual activity: Not Currently  Other Topics Concern   Not on file  Social History Narrative   Not on file   Social Drivers of Health   Financial Resource Strain: Low Risk  (11/03/2023)   Overall Financial Resource Strain (CARDIA)    Difficulty of Paying Living Expenses: Not hard at all  Food Insecurity: No Food Insecurity (11/03/2023)   Hunger Vital Sign    Worried About Running Out of Food in the Last Year: Never true    Ran Out of Food in the Last Year: Never true  Transportation Needs: No Transportation Needs (11/03/2023)   PRAPARE - Administrator, Civil Service (Medical): No    Lack of Transportation (Non-Medical): No  Physical Activity: Inactive (11/03/2023)   Exercise Vital Sign    Days of Exercise per Week: 0 days    Minutes of Exercise per Session: 0 min  Stress: No Stress Concern Present (11/03/2023)   [employer] of Occupational Health - Occupational Stress Questionnaire    Feeling of Stress : Not at all  Social Connections: Moderately Isolated (11/03/2023)    Social Connection and Isolation Panel [NHANES]    Frequency of Communication with Friends and Family: More than three times a week    Frequency of Social Gatherings with Friends and Family: Once a week    Attends Religious Services: Never    Database administrator or Organizations: No    Attends Banker Meetings: Never    Marital Status: Married  Catering manager Violence: Not At Risk (11/03/2023)   Humiliation, Afraid, Rape, and Kick questionnaire    Fear of Current or Ex-Partner: No    Emotionally Abused: No    Physically Abused: No    Sexually Abused: No     Review of Systems: General: negative for chills, fever, night sweats or weight  changes.  Cardiovascular: negative for chest pain, dyspnea on exertion, edema, orthopnea, palpitations, paroxysmal nocturnal dyspnea or shortness of breath Dermatological: negative for rash Respiratory: negative for cough or wheezing Urologic: negative for hematuria Abdominal: negative for nausea, vomiting, diarrhea, bright red blood per rectum, melena, or hematemesis Neurologic: negative for visual changes, syncope, or dizziness All other systems reviewed and are otherwise negative except as noted above.    Blood pressure (!) 80/40, pulse 79, height 5\' 10"  (1.778 m), weight 108 lb (49 kg), SpO2 95%.  General appearance: alert and no distress Neck: no adenopathy, no carotid bruit, no JVD, supple, symmetrical, trachea midline, and thyroid  not enlarged, symmetric, no tenderness/mass/nodules Lungs: clear to auscultation bilaterally Heart: regular rate and rhythm, S1, S2 normal, no murmur, click, rub or gallop Extremities: extremities normal, atraumatic, no cyanosis or edema Pulses: 2+ and symmetric Skin: Skin color, texture, turgor normal. No rashes or lesions Neurologic: Grossly normal  EKG EKG Interpretation Date/Time:  Tuesday December 29 2023 14:08:48 EDT Ventricular Rate:  74 PR Interval:  124 QRS Duration:  120 QT  Interval:  394 QTC Calculation: 437 R Axis:   -68  Text Interpretation: Normal sinus rhythm Left axis deviation Right bundle branch block When compared with ECG of 19-Feb-2020 23:29, ST elevation now present in Anterior leads T wave inversion no longer evident in Anterior leads Confirmed by Lauro Portal [phone]) on 12/29/2023 2:32:00 PM    ASSESSMENT AND PLAN:   CAD S/P percutaneous coronary angioplasty History of CAD status post non-STEMI 06/30/2008.  I catheterized him and stented his AV groove circumflex marginal branch bifurcation using driver bare-metal stents.  He has been asymptomatic since.  Essential hypertension History of essential hypertension with blood pressure measured today at 80/40.  He is lost quite a bit of weight as well.  He is on metoprolol  12 and half milligrams p.o. twice daily.  I am going to cut this down to daily.  Mixed hyperlipidemia History of mixed lipidemia on statin therapy with lipid profile performed 03/27/2022 revealing total cholesterol 138, LDL 66 and HDL 56.  Tobacco abuse Ongoing tobacco abuse with 3 to 4 cigarettes a day.     Avanell Leigh MD FACP,FACC,FAHA, [facility] 12/29/2023 2:49 PM

## 2023-12-29 NOTE — Assessment & Plan Note (Signed)
 History of essential hypertension with blood pressure measured today at 80/40.  He is lost quite a bit of weight as well.  He is on metoprolol  12 and half milligrams p.o. twice daily.  I am going to cut this down to daily.

## 2023-12-29 NOTE — Assessment & Plan Note (Signed)
 History of mixed lipidemia on statin therapy with lipid profile performed 03/27/2022 revealing total cholesterol 138, LDL 66 and HDL 56.

## 2023-12-29 NOTE — Progress Notes (Signed)
 This encounter was created in error - please disregard.

## 2024-01-08 ENCOUNTER — Other Ambulatory Visit: Payer: Self-pay

## 2024-01-08 DIAGNOSIS — C3492 Malignant neoplasm of unspecified part of left bronchus or lung: Secondary | ICD-10-CM

## 2024-01-11 ENCOUNTER — Encounter: Payer: Self-pay | Admitting: Hematology

## 2024-01-11 MED ORDER — MORPHINE SULFATE 15 MG PO TABS
15.0000 mg | ORAL_TABLET | Freq: Four times a day (QID) | ORAL | 0 refills | Status: DC | PRN
Start: 2024-01-11 — End: 2024-02-10

## 2024-01-11 MED ORDER — FENTANYL 50 MCG/HR TD PT72: 1.0000 | MEDICATED_PATCH | TRANSDERMAL | 0 refills | Status: DC

## 2024-01-12 ENCOUNTER — Other Ambulatory Visit: Payer: Self-pay | Admitting: Cardiovascular Disease
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
## 2024-01-13 ENCOUNTER — Inpatient Hospital Stay (HOSPITAL_BASED_OUTPATIENT_CLINIC_OR_DEPARTMENT_OTHER): Admitting: Hematology

## 2024-01-13 ENCOUNTER — Inpatient Hospital Stay: Attending: Hematology

## 2024-01-13 ENCOUNTER — Inpatient Hospital Stay

## 2024-01-13 VITALS — BP 108/51 | HR 89 | Temp 98.0°F | Resp 17 | Ht 70.0 in | Wt 107.9 lb

## 2024-01-13 DIAGNOSIS — G893 Neoplasm related pain (acute) (chronic): Secondary | ICD-10-CM | POA: Insufficient documentation

## 2024-01-13 DIAGNOSIS — Z95828 Presence of other vascular implants and grafts: Secondary | ICD-10-CM

## 2024-01-13 DIAGNOSIS — Z5112 Encounter for antineoplastic immunotherapy: Secondary | ICD-10-CM | POA: Diagnosis present

## 2024-01-13 DIAGNOSIS — C3492 Malignant neoplasm of unspecified part of left bronchus or lung: Secondary | ICD-10-CM

## 2024-01-13 DIAGNOSIS — Z8551 Personal history of malignant neoplasm of bladder: Secondary | ICD-10-CM | POA: Insufficient documentation

## 2024-01-13 DIAGNOSIS — Z923 Personal history of irradiation: Secondary | ICD-10-CM | POA: Diagnosis not present

## 2024-01-13 DIAGNOSIS — Z7189 Other specified counseling: Secondary | ICD-10-CM

## 2024-01-13 DIAGNOSIS — Z9221 Personal history of antineoplastic chemotherapy: Secondary | ICD-10-CM | POA: Insufficient documentation

## 2024-01-13 DIAGNOSIS — C3412 Malignant neoplasm of upper lobe, left bronchus or lung: Secondary | ICD-10-CM | POA: Diagnosis present

## 2024-01-13 DIAGNOSIS — C7951 Secondary malignant neoplasm of bone: Secondary | ICD-10-CM | POA: Diagnosis not present

## 2024-01-13 DIAGNOSIS — F17210 Nicotine dependence, cigarettes, uncomplicated: Secondary | ICD-10-CM | POA: Diagnosis not present

## 2024-01-13 DIAGNOSIS — Z8546 Personal history of malignant neoplasm of prostate: Secondary | ICD-10-CM | POA: Insufficient documentation

## 2024-01-13 DIAGNOSIS — Z79899 Other long term (current) drug therapy: Secondary | ICD-10-CM | POA: Insufficient documentation

## 2024-01-13 LAB — CMP (CANCER CENTER ONLY)
ALT: 17 U/L (ref 0–44)
AST: 22 U/L (ref 15–41)
Albumin: 3.9 g/dL (ref 3.5–5.0)
Alkaline Phosphatase: 65 U/L (ref 38–126)
Anion gap: 8 (ref 5–15)
BUN: 24 mg/dL — ABNORMAL HIGH (ref 8–23)
CO2: 31 mmol/L (ref 22–32)
Calcium: 9.6 mg/dL (ref 8.9–10.3)
Chloride: 101 mmol/L (ref 98–111)
Creatinine: 0.92 mg/dL (ref 0.61–1.24)
GFR, Estimated: >60 mL/min (ref >60)
Glucose, Bld: 105 mg/dL — ABNORMAL HIGH (ref 70–99)
Potassium: 4.3 mmol/L (ref 3.5–5.1)
Sodium: 140 mmol/L (ref 135–145)
Total Bilirubin: 0.4 mg/dL (ref 0.0–1.2)
Total Protein: 7.2 g/dL (ref 6.5–8.1)

## 2024-01-13 LAB — CBC WITH DIFFERENTIAL (CANCER CENTER ONLY)
Abs Immature Granulocytes: 0.02 10*3/uL (ref 0.00–0.07)
Basophils Absolute: 0 10*3/uL (ref 0.0–0.1)
Basophils Relative: 0 %
Eosinophils Absolute: 0.1 10*3/uL (ref 0.0–0.5)
Eosinophils Relative: 1 %
HCT: 34.6 % — ABNORMAL LOW (ref 39.0–52.0)
Hemoglobin: 11.5 g/dL — ABNORMAL LOW (ref 13.0–17.0)
Immature Granulocytes: 0 %
Lymphocytes Relative: 4 %
Lymphs Abs: 0.4 10*3/uL — ABNORMAL LOW (ref 0.7–4.0)
MCH: 31 pg (ref 26.0–34.0)
MCHC: 33.2 g/dL (ref 30.0–36.0)
MCV: 93.3 fL (ref 80.0–100.0)
Monocytes Absolute: 0.6 10*3/uL (ref 0.1–1.0)
Monocytes Relative: 6 %
Neutro Abs: 8.9 10*3/uL — ABNORMAL HIGH (ref 1.7–7.7)
Neutrophils Relative %: 89 %
Platelet Count: 211 10*3/uL (ref 150–400)
RBC: 3.71 MIL/uL — ABNORMAL LOW (ref 4.22–5.81)
RDW: 14.6 % (ref 11.5–15.5)
WBC Count: 10 10*3/uL (ref 4.0–10.5)
nRBC: 0 % (ref 0.0–0.2)

## 2024-01-13 MED ORDER — DIPHENHYDRAMINE HCL 25 MG PO CAPS
25.0000 mg | ORAL_CAPSULE | Freq: Once | ORAL | Status: AC
  Administered 2024-01-13: 25 mg via ORAL
  Filled 2024-01-13: qty 1

## 2024-01-13 MED ORDER — SODIUM CHLORIDE 0.9 % IV SOLN: Freq: Once | INTRAVENOUS | Status: AC

## 2024-01-13 MED ORDER — FAMOTIDINE 20 MG PO TABS
20.0000 mg | ORAL_TABLET | Freq: Once | ORAL | Status: AC
  Administered 2024-01-13: 20 mg via ORAL
  Filled 2024-01-13: qty 1

## 2024-01-13 MED ORDER — SODIUM CHLORIDE 0.9 % IV SOLN
200.0000 mg | Freq: Once | INTRAVENOUS | Status: AC
  Administered 2024-01-13: 200 mg via INTRAVENOUS
  Filled 2024-01-13: qty 200

## 2024-01-13 MED ORDER — SODIUM CHLORIDE 0.9% FLUSH
10.0000 mL | INTRAVENOUS | Status: DC | PRN
Start: 2024-01-13 — End: 2024-01-13
  Administered 2024-01-13: 10 mL

## 2024-01-13 NOTE — Patient Instructions (Signed)

## 2024-01-15 ENCOUNTER — Encounter (HOSPITAL_BASED_OUTPATIENT_CLINIC_OR_DEPARTMENT_OTHER): Payer: Self-pay | Admitting: *Deleted

## 2024-01-19 ENCOUNTER — Encounter: Payer: Self-pay | Admitting: Hematology
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
## 2024-02-03 ENCOUNTER — Inpatient Hospital Stay: Admitting: Hematology

## 2024-02-03 ENCOUNTER — Inpatient Hospital Stay

## 2024-02-03 VITALS — BP 99/57 | HR 79 | Temp 97.7°F | Resp 18 | Wt 111.6 lb

## 2024-02-03 DIAGNOSIS — C3492 Malignant neoplasm of unspecified part of left bronchus or lung: Secondary | ICD-10-CM

## 2024-02-03 DIAGNOSIS — Z5112 Encounter for antineoplastic immunotherapy: Secondary | ICD-10-CM | POA: Diagnosis not present

## 2024-02-03 DIAGNOSIS — C7951 Secondary malignant neoplasm of bone: Secondary | ICD-10-CM

## 2024-02-03 DIAGNOSIS — C3412 Malignant neoplasm of upper lobe, left bronchus or lung: Secondary | ICD-10-CM

## 2024-02-03 DIAGNOSIS — Z7189 Other specified counseling: Secondary | ICD-10-CM

## 2024-02-03 DIAGNOSIS — Z95828 Presence of other vascular implants and grafts: Secondary | ICD-10-CM

## 2024-02-03 LAB — CBC WITH DIFFERENTIAL (CANCER CENTER ONLY)
Abs Immature Granulocytes: 0.02 10*3/uL (ref 0.00–0.07)
Basophils Absolute: 0 10*3/uL (ref 0.0–0.1)
Basophils Relative: 0 %
Eosinophils Absolute: 0.1 10*3/uL (ref 0.0–0.5)
Eosinophils Relative: 1 %
HCT: 31.5 % — ABNORMAL LOW (ref 39.0–52.0)
Hemoglobin: 10.3 g/dL — ABNORMAL LOW (ref 13.0–17.0)
Immature Granulocytes: 0 %
Lymphocytes Relative: 4 %
Lymphs Abs: 0.3 10*3/uL — ABNORMAL LOW (ref 0.7–4.0)
MCH: 30.9 pg (ref 26.0–34.0)
MCHC: 32.7 g/dL (ref 30.0–36.0)
MCV: 94.6 fL (ref 80.0–100.0)
Monocytes Absolute: 0.5 10*3/uL (ref 0.1–1.0)
Monocytes Relative: 6 %
Neutro Abs: 7.4 10*3/uL (ref 1.7–7.7)
Neutrophils Relative %: 89 %
Platelet Count: 165 10*3/uL (ref 150–400)
RBC: 3.33 MIL/uL — ABNORMAL LOW (ref 4.22–5.81)
RDW: 15.4 % (ref 11.5–15.5)
WBC Count: 8.4 10*3/uL (ref 4.0–10.5)
nRBC: 0 % (ref 0.0–0.2)

## 2024-02-03 LAB — CMP (CANCER CENTER ONLY)
ALT: 16 U/L (ref 0–44)
AST: 21 U/L (ref 15–41)
Albumin: 3.6 g/dL (ref 3.5–5.0)
Alkaline Phosphatase: 62 U/L (ref 38–126)
Anion gap: 4 — ABNORMAL LOW (ref 5–15)
BUN: 22 mg/dL (ref 8–23)
CO2: 31 mmol/L (ref 22–32)
Calcium: 9.1 mg/dL (ref 8.9–10.3)
Chloride: 102 mmol/L (ref 98–111)
Creatinine: 0.73 mg/dL (ref 0.61–1.24)
GFR, Estimated: >60 mL/min (ref >60)
Glucose, Bld: 105 mg/dL — ABNORMAL HIGH (ref 70–99)
Potassium: 4.5 mmol/L (ref 3.5–5.1)
Sodium: 137 mmol/L (ref 135–145)
Total Bilirubin: 0.4 mg/dL (ref 0.0–1.2)
Total Protein: 6.7 g/dL (ref 6.5–8.1)

## 2024-02-03 LAB — TSH: TSH: 1.99 u[IU]/mL (ref 0.350–4.500)

## 2024-02-03 MED ORDER — FAMOTIDINE 20 MG PO TABS
20.0000 mg | ORAL_TABLET | Freq: Once | ORAL | Status: AC
  Administered 2024-02-03: 20 mg via ORAL
  Filled 2024-02-03: qty 1

## 2024-02-03 MED ORDER — SODIUM CHLORIDE 0.9 % IV SOLN
200.0000 mg | Freq: Once | INTRAVENOUS | Status: AC
  Administered 2024-02-03: 200 mg via INTRAVENOUS
  Filled 2024-02-03: qty 200

## 2024-02-03 MED ORDER — DIPHENHYDRAMINE HCL 25 MG PO CAPS
25.0000 mg | ORAL_CAPSULE | Freq: Once | ORAL | Status: AC
  Administered 2024-02-03: 25 mg via ORAL
  Filled 2024-02-03: qty 1

## 2024-02-03 MED ORDER — SODIUM CHLORIDE 0.9% FLUSH
10.0000 mL | INTRAVENOUS | Status: DC | PRN
  Administered 2024-02-03: 10 mL

## 2024-02-03 MED ORDER — SODIUM CHLORIDE 0.9 % IV SOLN: Freq: Once | INTRAVENOUS | Status: AC

## 2024-02-03 MED ORDER — SODIUM CHLORIDE 0.9% FLUSH
10.0000 mL | Freq: Once | INTRAVENOUS | Status: AC
  Administered 2024-02-03: 10 mL

## 2024-02-03 MED ORDER — HEPARIN SOD (PORK) LOCK FLUSH 100 UNIT/ML IV SOLN
500.0000 [IU] | Freq: Once | INTRAVENOUS | Status: AC | PRN
  Administered 2024-02-03: 500 [IU]

## 2024-02-03 NOTE — Patient Instructions (Signed)

## 2024-02-04 LAB — T4: T4, Total: 7.7 ug/dL (ref 4.5–12.0)

## 2024-02-09 ENCOUNTER — Encounter: Payer: Self-pay | Admitting: Hematology

## 2024-02-10 ENCOUNTER — Other Ambulatory Visit: Payer: Self-pay

## 2024-02-10 DIAGNOSIS — C3492 Malignant neoplasm of unspecified part of left bronchus or lung: Secondary | ICD-10-CM

## 2024-02-11 ENCOUNTER — Encounter: Payer: Self-pay | Admitting: Hematology

## 2024-02-11 MED ORDER — MORPHINE SULFATE 15 MG PO TABS: 15.0000 mg | ORAL_TABLET | Freq: Four times a day (QID) | ORAL | 0 refills | Status: DC | PRN

## 2024-02-11 MED ORDER — FENTANYL 50 MCG/HR TD PT72: 1.0000 | MEDICATED_PATCH | TRANSDERMAL | 0 refills | Status: DC

## 2024-02-23 NOTE — Progress Notes (Signed)
 Kyle Foster

## 2024-02-24 ENCOUNTER — Inpatient Hospital Stay: Admitting: Hematology

## 2024-02-24 ENCOUNTER — Inpatient Hospital Stay

## 2024-02-24 ENCOUNTER — Inpatient Hospital Stay: Attending: Hematology

## 2024-02-24 DIAGNOSIS — Z5112 Encounter for antineoplastic immunotherapy: Secondary | ICD-10-CM | POA: Diagnosis present

## 2024-02-24 DIAGNOSIS — Z7189 Other specified counseling: Secondary | ICD-10-CM

## 2024-02-24 DIAGNOSIS — Z8546 Personal history of malignant neoplasm of prostate: Secondary | ICD-10-CM | POA: Insufficient documentation

## 2024-02-24 DIAGNOSIS — C7951 Secondary malignant neoplasm of bone: Secondary | ICD-10-CM | POA: Diagnosis present

## 2024-02-24 DIAGNOSIS — Z8551 Personal history of malignant neoplasm of bladder: Secondary | ICD-10-CM | POA: Insufficient documentation

## 2024-02-24 DIAGNOSIS — C3412 Malignant neoplasm of upper lobe, left bronchus or lung: Secondary | ICD-10-CM | POA: Insufficient documentation

## 2024-02-24 DIAGNOSIS — Z95828 Presence of other vascular implants and grafts: Secondary | ICD-10-CM

## 2024-02-24 DIAGNOSIS — G893 Neoplasm related pain (acute) (chronic): Secondary | ICD-10-CM | POA: Diagnosis not present

## 2024-02-24 DIAGNOSIS — C3492 Malignant neoplasm of unspecified part of left bronchus or lung: Secondary | ICD-10-CM

## 2024-02-24 LAB — CBC WITH DIFFERENTIAL (CANCER CENTER ONLY)
Abs Immature Granulocytes: 0.02 10*3/uL (ref 0.00–0.07)
Basophils Absolute: 0 10*3/uL (ref 0.0–0.1)
Basophils Relative: 0 %
Eosinophils Absolute: 0.1 10*3/uL (ref 0.0–0.5)
Eosinophils Relative: 1 %
HCT: 31.3 % — ABNORMAL LOW (ref 39.0–52.0)
Hemoglobin: 10.2 g/dL — ABNORMAL LOW (ref 13.0–17.0)
Immature Granulocytes: 0 %
Lymphocytes Relative: 5 %
Lymphs Abs: 0.4 10*3/uL — ABNORMAL LOW (ref 0.7–4.0)
MCH: 30.7 pg (ref 26.0–34.0)
MCHC: 32.6 g/dL (ref 30.0–36.0)
MCV: 94.3 fL (ref 80.0–100.0)
Monocytes Absolute: 0.6 10*3/uL (ref 0.1–1.0)
Monocytes Relative: 8 %
Neutro Abs: 6.3 10*3/uL (ref 1.7–7.7)
Neutrophils Relative %: 86 %
Platelet Count: 167 10*3/uL (ref 150–400)
RBC: 3.32 MIL/uL — ABNORMAL LOW (ref 4.22–5.81)
RDW: 15.4 % (ref 11.5–15.5)
WBC Count: 7.4 10*3/uL (ref 4.0–10.5)
nRBC: 0 % (ref 0.0–0.2)

## 2024-02-24 LAB — CMP (CANCER CENTER ONLY)
ALT: 15 U/L (ref 0–44)
AST: 20 U/L (ref 15–41)
Albumin: 3.6 g/dL (ref 3.5–5.0)
Alkaline Phosphatase: 68 U/L (ref 38–126)
Anion gap: 4 — ABNORMAL LOW (ref 5–15)
BUN: 25 mg/dL — ABNORMAL HIGH (ref 8–23)
CO2: 30 mmol/L (ref 22–32)
Calcium: 9.1 mg/dL (ref 8.9–10.3)
Chloride: 105 mmol/L (ref 98–111)
Creatinine: 0.8 mg/dL (ref 0.61–1.24)
GFR, Estimated: >60 mL/min (ref >60)
Glucose, Bld: 115 mg/dL — ABNORMAL HIGH (ref 70–99)
Potassium: 4.6 mmol/L (ref 3.5–5.1)
Sodium: 139 mmol/L (ref 135–145)
Total Bilirubin: 0.4 mg/dL (ref 0.0–1.2)
Total Protein: 6.6 g/dL (ref 6.5–8.1)

## 2024-02-24 MED ORDER — DIPHENHYDRAMINE HCL 25 MG PO CAPS
25.0000 mg | ORAL_CAPSULE | Freq: Once | ORAL | Status: AC
  Administered 2024-02-24: 25 mg via ORAL

## 2024-02-24 MED ORDER — SODIUM CHLORIDE 0.9% FLUSH
10.0000 mL | INTRAVENOUS | Status: DC | PRN
  Administered 2024-02-24: 10 mL

## 2024-02-24 MED ORDER — FAMOTIDINE 20 MG PO TABS
20.0000 mg | ORAL_TABLET | Freq: Once | ORAL | Status: AC
  Administered 2024-02-24: 20 mg via ORAL

## 2024-02-24 MED ORDER — SODIUM CHLORIDE 0.9 % IV SOLN
200.0000 mg | Freq: Once | INTRAVENOUS | Status: AC
  Administered 2024-02-24: 200 mg via INTRAVENOUS
  Filled 2024-02-24: qty 200

## 2024-02-24 MED ORDER — SODIUM CHLORIDE 0.9 % IV SOLN: Freq: Once | INTRAVENOUS | Status: AC

## 2024-02-24 NOTE — Patient Instructions (Signed)

## 2024-02-25 ENCOUNTER — Encounter (HOSPITAL_BASED_OUTPATIENT_CLINIC_OR_DEPARTMENT_OTHER): Payer: Self-pay | Admitting: Family Medicine

## 2024-02-25 ENCOUNTER — Ambulatory Visit (HOSPITAL_BASED_OUTPATIENT_CLINIC_OR_DEPARTMENT_OTHER): Admitting: Family Medicine

## 2024-02-25 VITALS — BP 97/63 | HR 99 | Ht 70.0 in | Wt 102.4 lb

## 2024-02-25 DIAGNOSIS — J4489 Other specified chronic obstructive pulmonary disease: Secondary | ICD-10-CM | POA: Diagnosis not present

## 2024-02-25 DIAGNOSIS — J439 Emphysema, unspecified: Secondary | ICD-10-CM

## 2024-02-25 NOTE — Progress Notes (Signed)
    Procedures performed today:    None.  Independent interpretation of notes and tests performed by another provider:   None.  Brief History, Exam, Impression, and Recommendations:    BP 97/63 (BP Location: Left Arm, Patient Position: Sitting, Cuff Size: Normal)   Pulse 99   Ht 5' 10 (1.778 m)   Wt 102 lb 6.4 oz (46.4 kg)   SpO2 100%   BMI 14.69 kg/m   COPD with chronic bronchitis and emphysema (HCC) Assessment & Plan: Patient continues to note ongoing shortness of breath.  He did have appointment with pulmonology a few months ago and was diagnosed with exacerbation at that time.  He was treated with steroids as well as antibiotic therapy.  He reports that current symptoms are not as bad as at that time.  He does feel the current symptoms are closer to baseline presently.  He continues with inhalers as prescribed by pulmonology including Breztri , albuterol  as needed.  He also continues with Flonase  and nasal saline spray to help with sinus congestion. We discussed considerations today, recommend continue with current treatment.  Do not suspect current acute exacerbation and thus would not change therapy or add additional agent such as antibiotics or steroids.  Recommend monitoring symptoms moving forward and can return to office sooner should any worsening be noted.  Recommend following up with pulmonology as discussed at last visit with them.  Next visit recommended in about 2 months with pulmonology.  We will plan to follow-up in about 6 months to monitor progress or sooner as needed   Patient also concerned with weight and inability to gain any notable weight.  He has been attempting to utilize boost in conjunction with meals and dietary intake.  Ultimately, discussed association of chronic medical conditions contributing to low weight.  Recommend continuing with dietary supplementation with use of boost.  Could consider nutrition referral to provide further assistance.  Return in  about 6 months (around 08/26/2024).   ___________________________________________ Jamee Keach [name], MD, ABFM, CAQSM Primary Care and Sports [facility]

## 2024-02-25 NOTE — Patient Instructions (Signed)
?  Medication Instructions:  ?Your physician recommends that you continue on your current medications as directed. Please refer to the Current Medication list given to you today. ?--If you need a refill on any your medications before your next appointment, please call your pharmacy first. If no refills are authorized on file call the office.-- ? ? ?Follow-Up: ?Your next appointment:   ?Your physician recommends that you schedule a follow-up appointment in: 6 month follow up with Dr. de [place] ? ?You will receive a text message or e-mail with a link to a survey about your care and experience with us today! We would greatly appreciate your feedback!  ? ?Thanks for letting us be apart of your health journey!!  ?Primary Care and Sports Medicine  ? ?Dr. Raymond de [place]  ? ?We encourage you to activate your patient portal called "MyChart".  Sign up information is provided on this After Visit Summary.  MyChart is used to connect with patients for Virtual Visits (Telemedicine).  Patients are able to view lab/test results, encounter notes, upcoming appointments, etc.  Non-urgent messages can be sent to your provider as well. To learn more about what you can do with MyChart, please visit --  [email or web address]   ? ?

## 2024-02-25 NOTE — Assessment & Plan Note (Signed)
 Patient continues to note ongoing shortness of breath.  He did have appointment with pulmonology a few months ago and was diagnosed with exacerbation at that time.  He was treated with steroids as well as antibiotic therapy.  He reports that current symptoms are not as bad as at that time.  He does feel the current symptoms are closer to baseline presently.  He continues with inhalers as prescribed by pulmonology including Breztri , albuterol  as needed.  He also continues with Flonase  and nasal saline spray to help with sinus congestion. We discussed considerations today, recommend continue with current treatment.  Do not suspect current acute exacerbation and thus would not change therapy or add additional agent such as antibiotics or steroids.  Recommend monitoring symptoms moving forward and can return to office sooner should any worsening be noted.  Recommend following up with pulmonology as discussed at last visit with them.  Next visit recommended in about 2 months with pulmonology.  We will plan to follow-up in about 6 months to monitor progress or sooner as needed

## 2024-03-08 ENCOUNTER — Encounter: Payer: Self-pay | Admitting: Hematology

## 2024-03-14 ENCOUNTER — Other Ambulatory Visit: Payer: Self-pay

## 2024-03-14 DIAGNOSIS — C3492 Malignant neoplasm of unspecified part of left bronchus or lung: Secondary | ICD-10-CM

## 2024-03-14 MED ORDER — MORPHINE SULFATE 15 MG PO TABS: 15.0000 mg | ORAL_TABLET | Freq: Four times a day (QID) | ORAL | 0 refills | Status: DC | PRN

## 2024-03-14 MED ORDER — FENTANYL 50 MCG/HR TD PT72: 1.0000 | MEDICATED_PATCH | TRANSDERMAL | 0 refills | Status: DC

## 2024-03-16 ENCOUNTER — Inpatient Hospital Stay: Attending: Hematology

## 2024-03-16 ENCOUNTER — Inpatient Hospital Stay (HOSPITAL_BASED_OUTPATIENT_CLINIC_OR_DEPARTMENT_OTHER): Admitting: Physician Assistant

## 2024-03-16 ENCOUNTER — Encounter: Payer: Self-pay | Admitting: Hematology

## 2024-03-16 ENCOUNTER — Inpatient Hospital Stay

## 2024-03-16 ENCOUNTER — Other Ambulatory Visit: Payer: Self-pay | Admitting: Hematology and Oncology

## 2024-03-16 VITALS — BP 101/61 | HR 90 | Temp 97.8°F | Resp 16 | Wt 105.8 lb

## 2024-03-16 DIAGNOSIS — C3412 Malignant neoplasm of upper lobe, left bronchus or lung: Secondary | ICD-10-CM

## 2024-03-16 DIAGNOSIS — C61 Malignant neoplasm of prostate: Secondary | ICD-10-CM | POA: Diagnosis not present

## 2024-03-16 DIAGNOSIS — Z95828 Presence of other vascular implants and grafts: Secondary | ICD-10-CM

## 2024-03-16 DIAGNOSIS — F17210 Nicotine dependence, cigarettes, uncomplicated: Secondary | ICD-10-CM | POA: Insufficient documentation

## 2024-03-16 DIAGNOSIS — C7951 Secondary malignant neoplasm of bone: Secondary | ICD-10-CM | POA: Insufficient documentation

## 2024-03-16 DIAGNOSIS — Z5112 Encounter for antineoplastic immunotherapy: Secondary | ICD-10-CM | POA: Diagnosis present

## 2024-03-16 DIAGNOSIS — Z9221 Personal history of antineoplastic chemotherapy: Secondary | ICD-10-CM | POA: Diagnosis not present

## 2024-03-16 DIAGNOSIS — Z7189 Other specified counseling: Secondary | ICD-10-CM

## 2024-03-16 DIAGNOSIS — Z923 Personal history of irradiation: Secondary | ICD-10-CM | POA: Insufficient documentation

## 2024-03-16 DIAGNOSIS — C3492 Malignant neoplasm of unspecified part of left bronchus or lung: Secondary | ICD-10-CM

## 2024-03-16 DIAGNOSIS — Z86711 Personal history of pulmonary embolism: Secondary | ICD-10-CM | POA: Insufficient documentation

## 2024-03-16 LAB — CBC WITH DIFFERENTIAL (CANCER CENTER ONLY)
Abs Immature Granulocytes: 0.04 K/uL (ref 0.00–0.07)
Basophils Absolute: 0 K/uL (ref 0.0–0.1)
Basophils Relative: 0 %
Eosinophils Absolute: 0.2 K/uL (ref 0.0–0.5)
Eosinophils Relative: 2 %
HCT: 33.4 % — ABNORMAL LOW (ref 39.0–52.0)
Hemoglobin: 11.1 g/dL — ABNORMAL LOW (ref 13.0–17.0)
Immature Granulocytes: 0 %
Lymphocytes Relative: 4 %
Lymphs Abs: 0.4 K/uL — ABNORMAL LOW (ref 0.7–4.0)
MCH: 31 pg (ref 26.0–34.0)
MCHC: 33.2 g/dL (ref 30.0–36.0)
MCV: 93.3 fL (ref 80.0–100.0)
Monocytes Absolute: 0.6 K/uL (ref 0.1–1.0)
Monocytes Relative: 6 %
Neutro Abs: 8.4 K/uL — ABNORMAL HIGH (ref 1.7–7.7)
Neutrophils Relative %: 88 %
Platelet Count: 186 K/uL (ref 150–400)
RBC: 3.58 MIL/uL — ABNORMAL LOW (ref 4.22–5.81)
RDW: 15.1 % (ref 11.5–15.5)
WBC Count: 9.6 K/uL (ref 4.0–10.5)
nRBC: 0 % (ref 0.0–0.2)

## 2024-03-16 LAB — CMP (CANCER CENTER ONLY)
ALT: 15 U/L (ref 0–44)
AST: 20 U/L (ref 15–41)
Albumin: 3.6 g/dL (ref 3.5–5.0)
Alkaline Phosphatase: 69 U/L (ref 38–126)
Anion gap: 5 (ref 5–15)
BUN: 24 mg/dL — ABNORMAL HIGH (ref 8–23)
CO2: 31 mmol/L (ref 22–32)
Calcium: 9.4 mg/dL (ref 8.9–10.3)
Chloride: 103 mmol/L (ref 98–111)
Creatinine: 0.86 mg/dL (ref 0.61–1.24)
GFR, Estimated: >60 mL/min (ref >60)
Glucose, Bld: 131 mg/dL — ABNORMAL HIGH (ref 70–99)
Potassium: 4.4 mmol/L (ref 3.5–5.1)
Sodium: 139 mmol/L (ref 135–145)
Total Bilirubin: 0.5 mg/dL (ref 0.0–1.2)
Total Protein: 6.8 g/dL (ref 6.5–8.1)

## 2024-03-16 MED ORDER — SODIUM CHLORIDE 0.9% FLUSH
10.0000 mL | INTRAVENOUS | Status: DC | PRN
  Administered 2024-03-16: 10 mL

## 2024-03-16 MED ORDER — SODIUM CHLORIDE 0.9 % IV SOLN: Freq: Once | INTRAVENOUS | Status: AC

## 2024-03-16 MED ORDER — FAMOTIDINE 20 MG PO TABS
20.0000 mg | ORAL_TABLET | Freq: Once | ORAL | Status: AC
  Administered 2024-03-16: 20 mg via ORAL
  Filled 2024-03-16: qty 1

## 2024-03-16 MED ORDER — SODIUM CHLORIDE 0.9 % IV SOLN
200.0000 mg | Freq: Once | INTRAVENOUS | Status: AC
  Administered 2024-03-16: 200 mg via INTRAVENOUS
  Filled 2024-03-16: qty 200

## 2024-03-16 MED ORDER — DIPHENHYDRAMINE HCL 25 MG PO CAPS
25.0000 mg | ORAL_CAPSULE | Freq: Once | ORAL | Status: AC
  Administered 2024-03-16: 25 mg via ORAL
  Filled 2024-03-16: qty 1

## 2024-03-16 MED ORDER — HEPARIN SOD (PORK) LOCK FLUSH 100 UNIT/ML IV SOLN
500.0000 [IU] | Freq: Once | INTRAVENOUS | Status: AC | PRN
  Administered 2024-03-16: 500 [IU]
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
## 2024-03-16 NOTE — Patient Instructions (Signed)

## 2024-03-21 ENCOUNTER — Encounter: Payer: Self-pay | Admitting: Hematology

## 2024-03-21 NOTE — Progress Notes (Signed)
 This encounter was created in error - please disregard.

## 2024-03-28 ENCOUNTER — Other Ambulatory Visit (HOSPITAL_COMMUNITY): Payer: Self-pay

## 2024-03-28 ENCOUNTER — Other Ambulatory Visit: Payer: Self-pay | Admitting: Physician Assistant

## 2024-03-28 DIAGNOSIS — C3492 Malignant neoplasm of unspecified part of left bronchus or lung: Secondary | ICD-10-CM

## 2024-03-28 MED ORDER — MORPHINE SULFATE 30 MG PO TABS
15.0000 mg | ORAL_TABLET | Freq: Four times a day (QID) | ORAL | 0 refills | Status: DC | PRN
  Filled 2024-03-28: qty 60, 30d supply, fill #0

## 2024-04-05 ENCOUNTER — Other Ambulatory Visit: Payer: Self-pay | Admitting: Hematology

## 2024-04-05 DIAGNOSIS — C3492 Malignant neoplasm of unspecified part of left bronchus or lung: Secondary | ICD-10-CM

## 2024-04-06 ENCOUNTER — Other Ambulatory Visit

## 2024-04-06 ENCOUNTER — Inpatient Hospital Stay

## 2024-04-06 ENCOUNTER — Ambulatory Visit: Admitting: Hematology

## 2024-04-06 VITALS — BP 98/60 | HR 79 | Temp 98.1°F | Resp 16 | Wt 106.0 lb

## 2024-04-06 DIAGNOSIS — C3412 Malignant neoplasm of upper lobe, left bronchus or lung: Secondary | ICD-10-CM

## 2024-04-06 DIAGNOSIS — Z5112 Encounter for antineoplastic immunotherapy: Secondary | ICD-10-CM | POA: Diagnosis not present

## 2024-04-06 DIAGNOSIS — C3492 Malignant neoplasm of unspecified part of left bronchus or lung: Secondary | ICD-10-CM

## 2024-04-06 DIAGNOSIS — Z7189 Other specified counseling: Secondary | ICD-10-CM

## 2024-04-06 DIAGNOSIS — C7951 Secondary malignant neoplasm of bone: Secondary | ICD-10-CM

## 2024-04-06 DIAGNOSIS — Z95828 Presence of other vascular implants and grafts: Secondary | ICD-10-CM

## 2024-04-06 LAB — CBC WITH DIFFERENTIAL (CANCER CENTER ONLY)
Abs Immature Granulocytes: 0.04 K/uL (ref 0.00–0.07)
Basophils Absolute: 0 K/uL (ref 0.0–0.1)
Basophils Relative: 0 %
Eosinophils Absolute: 0.1 K/uL (ref 0.0–0.5)
Eosinophils Relative: 1 %
HCT: 31.6 % — ABNORMAL LOW (ref 39.0–52.0)
Hemoglobin: 10.5 g/dL — ABNORMAL LOW (ref 13.0–17.0)
Immature Granulocytes: 0 %
Lymphocytes Relative: 3 %
Lymphs Abs: 0.3 K/uL — ABNORMAL LOW (ref 0.7–4.0)
MCH: 31.3 pg (ref 26.0–34.0)
MCHC: 33.2 g/dL (ref 30.0–36.0)
MCV: 94 fL (ref 80.0–100.0)
Monocytes Absolute: 0.6 K/uL (ref 0.1–1.0)
Monocytes Relative: 6 %
Neutro Abs: 8.2 K/uL — ABNORMAL HIGH (ref 1.7–7.7)
Neutrophils Relative %: 90 %
Platelet Count: 174 K/uL (ref 150–400)
RBC: 3.36 MIL/uL — ABNORMAL LOW (ref 4.22–5.81)
RDW: 15.2 % (ref 11.5–15.5)
WBC Count: 9.3 K/uL (ref 4.0–10.5)
nRBC: 0 % (ref 0.0–0.2)

## 2024-04-06 LAB — CMP (CANCER CENTER ONLY)
ALT: 14 U/L (ref 0–44)
AST: 17 U/L (ref 15–41)
Albumin: 3.5 g/dL (ref 3.5–5.0)
Alkaline Phosphatase: 63 U/L (ref 38–126)
Anion gap: 4 — ABNORMAL LOW (ref 5–15)
BUN: 28 mg/dL — ABNORMAL HIGH (ref 8–23)
CO2: 32 mmol/L (ref 22–32)
Calcium: 9.3 mg/dL (ref 8.9–10.3)
Chloride: 105 mmol/L (ref 98–111)
Creatinine: 0.8 mg/dL (ref 0.61–1.24)
GFR, Estimated: >60 mL/min (ref >60)
Glucose, Bld: 114 mg/dL — ABNORMAL HIGH (ref 70–99)
Potassium: 4.5 mmol/L (ref 3.5–5.1)
Sodium: 141 mmol/L (ref 135–145)
Total Bilirubin: 0.4 mg/dL (ref 0.0–1.2)
Total Protein: 7 g/dL (ref 6.5–8.1)

## 2024-04-06 MED ORDER — DIPHENHYDRAMINE HCL 25 MG PO CAPS
25.0000 mg | ORAL_CAPSULE | Freq: Once | ORAL | Status: AC
  Administered 2024-04-06: 25 mg via ORAL
  Filled 2024-04-06: qty 1

## 2024-04-06 MED ORDER — SODIUM CHLORIDE 0.9 % IV SOLN
200.0000 mg | Freq: Once | INTRAVENOUS | Status: AC
  Administered 2024-04-06: 200 mg via INTRAVENOUS
  Filled 2024-04-06: qty 200

## 2024-04-06 MED ORDER — SODIUM CHLORIDE 0.9% FLUSH
10.0000 mL | INTRAVENOUS | Status: DC | PRN
  Administered 2024-04-06: 10 mL

## 2024-04-06 MED ORDER — FAMOTIDINE 20 MG PO TABS
20.0000 mg | ORAL_TABLET | Freq: Once | ORAL | Status: AC
  Administered 2024-04-06: 20 mg via ORAL
  Filled 2024-04-06: qty 1

## 2024-04-06 MED ORDER — SODIUM CHLORIDE 0.9% FLUSH: 10.0000 mL | INTRAVENOUS | Status: DC | PRN

## 2024-04-06 MED ORDER — SODIUM CHLORIDE 0.9 % IV SOLN
Freq: Once | INTRAVENOUS | Status: AC
Start: 2024-04-06 — End: 2024-04-06

## 2024-04-06 NOTE — Progress Notes (Signed)
 Per Dr. Onesimo, pt has been stable we have been seeing him every 6 weeks--he can proceed with his treatment today...will need MD visit with next treatment (sched 04/27/24).  Varnell Orvis, Pharm.D., CPP 04/06/2024@3 :17 PM

## 2024-04-06 NOTE — Patient Instructions (Signed)

## 2024-04-07 LAB — TSH: TSH: 1.04 u[IU]/mL (ref 0.350–4.500)

## 2024-04-07 LAB — T4: T4, Total: 8.6 ug/dL (ref 4.5–12.0)

## 2024-04-10 ENCOUNTER — Other Ambulatory Visit: Payer: Self-pay | Admitting: Hematology

## 2024-04-10 DIAGNOSIS — C3492 Malignant neoplasm of unspecified part of left bronchus or lung: Secondary | ICD-10-CM

## 2024-04-11 ENCOUNTER — Encounter: Payer: Self-pay | Admitting: Hematology

## 2024-04-18 ENCOUNTER — Ambulatory Visit (HOSPITAL_COMMUNITY)
Admission: RE | Admit: 2024-04-18 | Discharge: 2024-04-18 | Disposition: A | Discharge status: Home / Self Care | Source: Ambulatory Visit | Attending: Physician Assistant | Admitting: Physician Assistant

## 2024-04-18 DIAGNOSIS — C3492 Malignant neoplasm of unspecified part of left bronchus or lung: Secondary | ICD-10-CM | POA: Diagnosis present

## 2024-04-25 ENCOUNTER — Other Ambulatory Visit: Payer: Self-pay

## 2024-04-25 DIAGNOSIS — C3492 Malignant neoplasm of unspecified part of left bronchus or lung: Secondary | ICD-10-CM

## 2024-04-27 ENCOUNTER — Inpatient Hospital Stay: Admitting: Hematology

## 2024-04-27 ENCOUNTER — Inpatient Hospital Stay

## 2024-04-28 ENCOUNTER — Encounter: Payer: Self-pay | Admitting: Hematology

## 2024-04-28 MED ORDER — FENTANYL 50 MCG/HR TD PT72: 1.0000 | MEDICATED_PATCH | TRANSDERMAL | 0 refills | Status: DC

## 2024-04-28 MED ORDER — MORPHINE SULFATE 30 MG PO TABS: 15.0000 mg | ORAL_TABLET | Freq: Four times a day (QID) | ORAL | 0 refills | Status: DC | PRN

## 2024-04-29 ENCOUNTER — Other Ambulatory Visit: Payer: Self-pay | Admitting: Pulmonary Disease

## 2024-04-29 DIAGNOSIS — J309 Allergic rhinitis, unspecified: Secondary | ICD-10-CM

## 2024-05-18 ENCOUNTER — Ambulatory Visit: Admitting: Hematology

## 2024-05-18 ENCOUNTER — Other Ambulatory Visit

## 2024-05-18 ENCOUNTER — Inpatient Hospital Stay: Attending: Hematology

## 2024-05-18 ENCOUNTER — Inpatient Hospital Stay

## 2024-05-18 VITALS — BP 117/70 | HR 72 | Temp 98.2°F | Resp 16 | Wt 108.5 lb

## 2024-05-18 DIAGNOSIS — C7951 Secondary malignant neoplasm of bone: Secondary | ICD-10-CM | POA: Diagnosis present

## 2024-05-18 DIAGNOSIS — Z8546 Personal history of malignant neoplasm of prostate: Secondary | ICD-10-CM | POA: Insufficient documentation

## 2024-05-18 DIAGNOSIS — G893 Neoplasm related pain (acute) (chronic): Secondary | ICD-10-CM | POA: Insufficient documentation

## 2024-05-18 DIAGNOSIS — C3492 Malignant neoplasm of unspecified part of left bronchus or lung: Secondary | ICD-10-CM

## 2024-05-18 DIAGNOSIS — Z7189 Other specified counseling: Secondary | ICD-10-CM

## 2024-05-18 DIAGNOSIS — C3412 Malignant neoplasm of upper lobe, left bronchus or lung: Secondary | ICD-10-CM

## 2024-05-18 DIAGNOSIS — Z95828 Presence of other vascular implants and grafts: Secondary | ICD-10-CM

## 2024-05-18 DIAGNOSIS — Z5112 Encounter for antineoplastic immunotherapy: Secondary | ICD-10-CM | POA: Insufficient documentation

## 2024-05-18 LAB — CBC WITH DIFFERENTIAL (CANCER CENTER ONLY)
Abs Immature Granulocytes: 0.03 K/uL (ref 0.00–0.07)
Basophils Absolute: 0 K/uL (ref 0.0–0.1)
Basophils Relative: 0 %
Eosinophils Absolute: 0.1 K/uL (ref 0.0–0.5)
Eosinophils Relative: 1 %
HCT: 31.7 % — ABNORMAL LOW (ref 39.0–52.0)
Hemoglobin: 10.6 g/dL — ABNORMAL LOW (ref 13.0–17.0)
Immature Granulocytes: 0 %
Lymphocytes Relative: 4 %
Lymphs Abs: 0.4 K/uL — ABNORMAL LOW (ref 0.7–4.0)
MCH: 30.8 pg (ref 26.0–34.0)
MCHC: 33.4 g/dL (ref 30.0–36.0)
MCV: 92.2 fL (ref 80.0–100.0)
Monocytes Absolute: 0.7 K/uL (ref 0.1–1.0)
Monocytes Relative: 8 %
Neutro Abs: 7 K/uL (ref 1.7–7.7)
Neutrophils Relative %: 87 %
Platelet Count: 173 K/uL (ref 150–400)
RBC: 3.44 MIL/uL — ABNORMAL LOW (ref 4.22–5.81)
RDW: 15 % (ref 11.5–15.5)
WBC Count: 8.2 K/uL (ref 4.0–10.5)
nRBC: 0 % (ref 0.0–0.2)

## 2024-05-18 LAB — CMP (CANCER CENTER ONLY)
ALT: 14 U/L (ref 0–44)
AST: 19 U/L (ref 15–41)
Albumin: 3.6 g/dL (ref 3.5–5.0)
Alkaline Phosphatase: 63 U/L (ref 38–126)
Anion gap: 4 — ABNORMAL LOW (ref 5–15)
BUN: 19 mg/dL (ref 8–23)
CO2: 32 mmol/L (ref 22–32)
Calcium: 9.3 mg/dL (ref 8.9–10.3)
Chloride: 102 mmol/L (ref 98–111)
Creatinine: 0.81 mg/dL (ref 0.61–1.24)
GFR, Estimated: >60 mL/min (ref >60)
Glucose, Bld: 96 mg/dL (ref 70–99)
Potassium: 4.3 mmol/L (ref 3.5–5.1)
Sodium: 138 mmol/L (ref 135–145)
Total Bilirubin: 0.4 mg/dL (ref 0.0–1.2)
Total Protein: 6.8 g/dL (ref 6.5–8.1)

## 2024-05-18 MED ORDER — SODIUM CHLORIDE 0.9 % IV SOLN
200.0000 mg | Freq: Once | INTRAVENOUS | Status: AC
  Administered 2024-05-18: 200 mg via INTRAVENOUS
  Filled 2024-05-18: qty 8

## 2024-05-18 MED ORDER — DIPHENHYDRAMINE HCL 25 MG PO CAPS
25.0000 mg | ORAL_CAPSULE | Freq: Once | ORAL | Status: AC
  Administered 2024-05-18: 25 mg via ORAL
  Filled 2024-05-18: qty 1

## 2024-05-18 MED ORDER — FAMOTIDINE 20 MG PO TABS
20.0000 mg | ORAL_TABLET | Freq: Once | ORAL | Status: AC
  Administered 2024-05-18: 20 mg via ORAL
  Filled 2024-05-18: qty 1

## 2024-05-18 MED ORDER — SODIUM CHLORIDE 0.9 % IV SOLN: Freq: Once | INTRAVENOUS | Status: AC

## 2024-05-18 MED ORDER — SODIUM CHLORIDE 0.9% FLUSH
10.0000 mL | INTRAVENOUS | Status: DC | PRN
  Administered 2024-05-18: 10 mL

## 2024-05-18 NOTE — Patient Instructions (Addendum)
 [facility] - A DEPT OF [facility]   Discharge Instructions: Thank you for choosing [facility] to provide your oncology and hematology care.   If you have a lab appointment with the Cancer Center, please go directly to the Cancer Center and check in at the registration area.   Wear comfortable clothing and clothing appropriate for easy access to any Portacath or PICC line.   We strive to give you quality time with your provider. You may need to reschedule your appointment if you arrive late (15 or more minutes).  Arriving late affects you and other patients whose appointments are after yours.  Also, if you miss three or more appointments without notifying the office, you may be dismissed from the clinic at the provider's discretion.      For prescription refill requests, have your pharmacy contact our office and allow 72 hours for refills to be completed.    Today you received the following chemotherapy and/or immunotherapy agents: Pembrolizumab Rande Lawman)      To help prevent nausea and vomiting after your treatment, we encourage you to take your nausea medication as directed.  BELOW ARE SYMPTOMS THAT SHOULD BE REPORTED IMMEDIATELY: *FEVER GREATER THAN 100.4 F (38 C) OR HIGHER *CHILLS OR SWEATING *NAUSEA AND VOMITING THAT IS NOT CONTROLLED WITH YOUR NAUSEA MEDICATION *UNUSUAL SHORTNESS OF BREATH *UNUSUAL BRUISING OR BLEEDING *URINARY PROBLEMS (pain or burning when urinating, or frequent urination) *BOWEL PROBLEMS (unusual diarrhea, constipation, pain near the anus) TENDERNESS IN MOUTH AND THROAT WITH OR WITHOUT PRESENCE OF ULCERS (sore throat, sores in mouth, or a toothache) UNUSUAL RASH, SWELLING OR PAIN  UNUSUAL VAGINAL DISCHARGE OR ITCHING   Items with * indicate a potential emergency and should be followed up as soon as possible or go to the Emergency Department if any problems should occur.  Please show the CHEMOTHERAPY ALERT CARD  or IMMUNOTHERAPY ALERT CARD at check-in to the Emergency Department and triage nurse.  Should you have questions after your visit or need to cancel or reschedule your appointment, please contact [facility] - A DEPT OF [facility]  Dept: [phone]  and follow the prompts.  Office hours are 8:00 a.m. to 4:30 p.m. Monday - Friday. Please note that voicemails left after 4:00 p.m. may not be returned until the following business day.  We are closed weekends and major holidays. You have access to a nurse at all times for urgent questions. Please call the main number to the clinic Dept: [phone] and follow the prompts.   For any non-urgent questions, you may also contact your provider using MyChart. We now offer e-Visits for anyone 75 and older to request care online for non-urgent symptoms. For details visit [email or web address].   Also download the MyChart app! Go to the app store, search "MyChart", open the app, select , and log in with your MyChart username and password.

## 2024-05-31 ENCOUNTER — Other Ambulatory Visit: Payer: Self-pay

## 2024-05-31 DIAGNOSIS — C3492 Malignant neoplasm of unspecified part of left bronchus or lung: Secondary | ICD-10-CM

## 2024-06-01 ENCOUNTER — Encounter: Payer: Self-pay | Admitting: Hematology

## 2024-06-01 MED ORDER — MORPHINE SULFATE 30 MG PO TABS: 15.0000 mg | ORAL_TABLET | Freq: Four times a day (QID) | ORAL | 0 refills | Status: DC | PRN

## 2024-06-01 MED ORDER — LIDOCAINE-PRILOCAINE 2.5-2.5 % EX CREA: 1.0000 | TOPICAL_CREAM | CUTANEOUS | 3 refills | Status: AC | PRN

## 2024-06-01 MED ORDER — FENTANYL 50 MCG/HR TD PT72: 1.0000 | MEDICATED_PATCH | TRANSDERMAL | 0 refills | Status: DC

## 2024-06-02 ENCOUNTER — Encounter: Payer: Self-pay | Admitting: Hematology

## 2024-06-02 ENCOUNTER — Other Ambulatory Visit

## 2024-06-02 ENCOUNTER — Ambulatory Visit (INDEPENDENT_AMBULATORY_CARE_PROVIDER_SITE_OTHER): Admitting: Pulmonary Disease

## 2024-06-02 ENCOUNTER — Encounter: Payer: Self-pay | Admitting: Pulmonary Disease

## 2024-06-02 VITALS — BP 94/59 | HR 91 | Ht 70.0 in | Wt 105.6 lb

## 2024-06-02 DIAGNOSIS — J984 Other disorders of lung: Secondary | ICD-10-CM

## 2024-06-02 DIAGNOSIS — J441 Chronic obstructive pulmonary disease with (acute) exacerbation: Secondary | ICD-10-CM

## 2024-06-02 NOTE — Patient Instructions (Addendum)
 Continue breztri  inhaler 2 puffs twice daily  Continue albuterol  inhaler or duoneb nebulizer treatment as needed  We will send sputum for culture testing  We will check labs today to evaluate for aspergillus infection  Follow up in 6 months, call sooner if needed

## 2024-06-02 NOTE — Progress Notes (Signed)
 Synopsis: Referred in February 2024 for COPD  Subjective:   PATIENT ID: Kyle Foster GENDER: male DOB: 09-28-1945, MRN: [id number]  HPI  Chief Complaint  Patient presents with   Medical Management of Chronic Issues    Pt states all the same    Kyle Foster is a 78 year old male, daily smoker with CAD, GERD, parotid gland cancer, squamous cell cancer of lung, prostate cancer, HLD, HTN who is referred to pulmonary clinic for COPD/Emphysema.   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   He is using trelegy ellipta  1 puff daily along with duoneb treatments 2 times per week and as needed albuterol  inhaler.   He has productive cough in the mornings. He denies issues with wheezing. No night time awakenings. He has not required prednisone  for his breathing in the past ear.   He is smoking 5-10 cigarettes per day. He does remain active working around his home and garage. He reports walking home from the autoshop recently which was ab out a mile in distance without issue.  OV 01/29/23 He reports cough, dyspnea and increased mucous production since completing radiation therapy last month. Denies fevers, chills or sweats. He had CXR 5/20 at primary care office which shows left perihilar opacity in vicinity of recent radiation.   OV 06/01/23 She reports he continues to get over an upper respiratory infection that he developed approximately 3 weeks ago.  He continues to have some sinus congestion and a cough.  He was initially treated with prednisone  and Z-Pak which helped a bit.  He continues to have productive cough with thick white sputum.  He gets short of breath walking up stairs.  OV 11/05/23 He felt better after steroid course and augmentin  antibiotic after last visit. Had about 3 weeks of relief until he re-developed productive cough with  intermittent hemoptysis over the past 4 weeks. He continues with pembrolizumab  infusions.   OV 06/02/24 He has a persistent cough with thick, yellow sputum and no recent hemoptysis. Multiple courses of antibiotics and prednisone  have not led to significant improvement. He experiences deep, hard coughing and uses a nebulizer less frequently due to chest discomfort.  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 He has scarring from radiation to the left upper lung.  He denies fevers, chills, or sweats but frequently feels cold. He experiences episodes of fatigue and sleeps extensively during these periods, sometimes associated with increased physical activity.  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    GERD (gastroesophageal reflux disease)    History of cancer chemotherapy    History of external beam radiation therapy    completed radiation for parotid cancer 2011;   and had radiation for lung cancer completed 02/ 2020   History of kidney stones    History of MI (myocardial infarction)  06/2008   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 immunotherapy;   mets to bone,  stage IV,  completed radiation 11-03-2018,  chemotherapy ongoing since 11-24-2018     Family History  Problem Relation Age of Onset   Heart attack Mother 23   Cancer Mother        Lung   Diabetes Mother    Heart disease Mother    Hypertension Mother    Stroke Father 49   Cancer Father        Lung   Heart disease Father    Brain cancer Brother    Cancer Sister        Melanoma of great Toe   Hyperlipidemia Sister    Cancer Brother      Social History   Socioeconomic History   Marital status: Married    Spouse name: Not on file   Number of children: Not on file   Years of education: Not on file   Highest education level: Associate degree: occupational, Scientist, product/process development, or vocational program  Occupational History   Not on file  Tobacco Use   Smoking status: Every Day    Current packs/day: 0.50    Average packs/day: 0.5 packs/day for 57.7 years (28.9 ttl pk-yrs)    Types: Cigarettes    Start date: 1968    Passive exposure: Current   Smokeless tobacco: Never   Tobacco comments:    smokes 5-6 cigarettes a day Updated 08/03/2023 TJ, CMA  Vaping Use   Vaping status: Never Used  Substance and Sexual Activity   Alcohol  use: Not Currently   Drug use: Never   Sexual activity: Not Currently  Other Topics Concern   Not on file  Social History Narrative   Not on file   Social Drivers of Health   Financial Resource Strain: Low Risk  (02/23/2024)   Overall Financial Resource Strain (CARDIA)    Difficulty of Paying Living Expenses: Not hard at all  Food Insecurity: No Food Insecurity (02/23/2024)   Hunger Vital Sign    Worried About Running Out of Food in the Last Year: Never true    Ran Out of Food in the Last Year: Never true  Transportation Needs: No  Transportation Needs (02/23/2024)   PRAPARE - Administrator, Civil Service (Medical): No    Lack of Transportation (Non-Medical): No  Physical Activity: Inactive (02/23/2024)   Exercise Vital Sign    Days of Exercise per Week: 0 days    Minutes of Exercise per Session: Not on file  Stress: No Stress Concern Present (02/23/2024)   [employer] of Occupational Health - Occupational Stress Questionnaire    Feeling of Stress: Only a little  Social Connections: Moderately Isolated (02/23/2024)   Social Connection and Isolation Panel    Frequency of Communication with Friends  and Family: Once a week    Frequency of Social Gatherings with Friends and Family: Once a week    Attends Religious Services: 1 to 4 times per year    Active Member of [employer] or Organizations: No    Attends Engineer, structural: Not on file    Marital Status: Married  Catering manager Violence: Not At Risk (11/03/2023)   Humiliation, Afraid, Rape, and Kick questionnaire    Fear of Current or Ex-Partner: No    Emotionally Abused: No    Physically Abused: No    Sexually Abused: No     Allergies  Allergen Reactions   Codeine Other (See Comments)    HEADACHE     Outpatient Medications Prior to Visit  Medication Sig Dispense Refill   acetaminophen  (TYLENOL ) 500 MG tablet Take 1,000 mg by mouth every 8 (eight) hours as needed for moderate pain.     albuterol  (VENTOLIN  HFA) 108 (90 Base) MCG/ACT inhaler Inhale 1 puff into the lungs every 6 (six) hours as needed for wheezing or shortness of breath. 17 g 3   atorvastatin  (LIPITOR ) 80 MG tablet TAKE 1 TABLET BY MOUTH EVERY DAY 90 tablet 3   B Complex-C (B-COMPLEX WITH VITAMIN C) tablet Take 1 tablet by mouth daily.     Budeson-Glycopyrrol-Formoterol (BREZTRI  AEROSPHERE) 160-9-4.8 MCG/ACT AERO Inhale 2 puffs into the lungs in the morning and at bedtime. 10.7 g 11   calcium  carbonate (TUMS - DOSED IN MG ELEMENTAL CALCIUM ) 500 MG chewable tablet Chew 1  tablet (200 mg of elemental calcium  total) by mouth 3 (three) times daily with meals. (Patient taking differently: Chew 1 tablet by mouth 3 (three) times daily as needed for indigestion.) 30 tablet 3   Cholecalciferol  (VITAMIN D ) 2000 UNITS tablet Take 2,000 Units by mouth daily.     clopidogrel  (PLAVIX ) 75 MG tablet TAKE 1 TABLET BY MOUTH EVERY DAY 90 tablet 3   dronabinol  (MARINOL ) 2.5 MG capsule Take 1 capsule (2.5 mg total) by mouth 2 (two) times daily before a meal. 60 capsule 1   DULoxetine  (CYMBALTA ) 30 MG capsule TAKE 2 CAPSULES BY MOUTH EVERY DAY 180 capsule 2   fentaNYL  (DURAGESIC ) 50 MCG/HR Place 1 patch onto the skin every 3 (three) days. 10 patch 0   fluticasone  (FLONASE ) 50 MCG/ACT nasal spray SPRAY 1 SPRAY INTO BOTH NOSTRILS DAILY. 48 mL 2   Hypromellose (ARTIFICIAL TEARS OP) Place 1 drop into both eyes as needed.     ipratropium-albuterol  (DUONEB) 0.5-2.5 (3) MG/3ML SOLN Take 3 mLs by nebulization 4 (four) times daily as needed. 360 mL 3   Lactobacillus (PROBIOTIC GOLD EXTRA STRENGTH) CAPS Take 1 capsule by mouth daily.     lidocaine -prilocaine  (EMLA ) cream Apply 1 Application topically as needed (access port). 30 g 3   loratadine  (CLARITIN ) 10 MG tablet Take 10 mg by mouth daily as needed for allergies.     magnesium  oxide (MAG-OX) 400 MG tablet Take 1 tablet (400 mg total) by mouth daily. 90 tablet 0   metoprolol  tartrate (LOPRESSOR ) 25 MG tablet Take 0.5 tablets (12.5 mg total) by mouth daily. 45 tablet 3   morphine  (MSIR) 30 MG tablet Take 0.5 tablets (15 mg total) by mouth every 6 (six) hours as needed for moderate pain (pain score 4-6) or severe pain (pain score 7-10). 60 tablet 0   ondansetron  (ZOFRAN ) 8 MG tablet TAKE 1 TABLET (8 MG TOTAL) BY MOUTH 3 (THREE) TIMES DAILY AS NEEDED. 90 tablet 2   pantoprazole  (  PROTONIX ) 40 MG tablet TAKE 1 TABLET BY MOUTH EVERY DAY 90 tablet 3   Polyvinyl Alcohol  (TEARS AGAIN OP) Place 1 drop into the left eye nightly.     tamsulosin  (FLOMAX )  0.4 MG CAPS capsule Take 1 capsule (0.4 mg total) by mouth daily after supper. 30 capsule 5   amoxicillin -clavulanate (AUGMENTIN ) 875-125 MG tablet Take 1 tablet by mouth 2 (two) times daily. 28 tablet 0   azithromycin  (ZITHROMAX ) 250 MG tablet Take as directed 6 tablet 0   No facility-administered medications prior to visit.   Review of Systems  Constitutional:  Negative for chills, fever, malaise/fatigue and weight loss.  HENT:  Negative for congestion, ear pain, sinus pain and sore throat.   Eyes: Negative.   Respiratory:  Positive for cough, sputum production, shortness of breath and wheezing. Negative for hemoptysis.   Cardiovascular:  Negative for chest pain, palpitations, orthopnea, claudication and leg swelling.  Gastrointestinal:  Negative for abdominal pain, heartburn, nausea and vomiting.  Genitourinary: Negative.   Musculoskeletal:  Negative for joint pain and myalgias.  Skin:  Negative for rash.  Neurological:  Negative for weakness.  Endo/Heme/Allergies: Negative.   Psychiatric/Behavioral: Negative.     Objective:   Vitals:   06/02/24 1259  BP: (!) 94/59  Pulse: 91  SpO2: 95%  Weight: 105 lb 9.6 oz (47.9 kg)  Height: 5' 10 (1.778 m)    Physical Exam Constitutional:      General: He is not in acute distress. HENT:     Head: Normocephalic and atraumatic.  Cardiovascular:     Rate and Rhythm: Normal rate and regular rhythm.     Pulses: Normal pulses.     Heart sounds: Normal heart sounds. No murmur heard. Pulmonary:     Breath sounds: Decreased air movement present. No wheezing.  Musculoskeletal:     Right lower leg: No edema.     Left lower leg: No edema.  Lymphadenopathy:     Cervical: No cervical adenopathy.  Skin:    General: Skin is warm and dry.  Neurological:     General: No focal deficit present.     Mental Status: He is alert.     CBC    Component Value Date/Time   WBC 8.2 05/18/2024 1143   WBC 7.7 08/07/2021 1239   RBC 3.44 (L)  05/18/2024 1143   HGB 10.6 (L) 05/18/2024 1143   HCT 31.7 (L) 05/18/2024 1143   PLT 173 05/18/2024 1143   MCV 92.2 05/18/2024 1143   MCH 30.8 05/18/2024 1143   MCHC 33.4 05/18/2024 1143   RDW 15.0 05/18/2024 1143   LYMPHSABS 0.4 (L) 05/18/2024 1143   MONOABS 0.7 05/18/2024 1143   EOSABS 0.1 05/18/2024 1143   BASOSABS 0.0 05/18/2024 1143      Latest Ref Rng & Units 05/18/2024   11:43 AM 04/06/2024    2:21 PM 03/16/2024   10:18 AM  BMP  Glucose 70 - 99 mg/dL 96  885  868   BUN 8 - 23 mg/dL 19  28  24    Creatinine 0.61 - 1.24 mg/dL 9.18  9.19  9.13   Sodium 135 - 145 mmol/L 138  141  139   Potassium 3.5 - 5.1 mmol/L 4.3  4.5  4.4   Chloride 98 - 111 mmol/L 102  105  103   CO2 22 - 32 mmol/L 32  32  31   Calcium  8.9 - 10.3 mg/dL 9.3  9.3  9.4    Chest imaging: CT  Chest 04/18/24 1. Unchanged post treatment/post radiation appearance of the left chest, with a large, cavitary lesion of the posterior left upper lobe and superior segment left lower lobe. 2. 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 3. New irregular nodule of the posterior right upper lobe measuring 0.6 cm, nonspecific, most likely infectious or inflammatory although warranting attention on follow-up. 4. Unchanged large lytic lesion of the left aspect of the T5 and T6 vertebral bodies as well as the rib heads underlying left lung mass. 5. Emphysema. 6. Coronary artery disease. 7. Nonobstructive right nephrolithiasis.  PFT:    Latest Ref Rng & Units 05/02/2019    9:01 AM  PFT Results  FVC-Pre L 2.57  P  FVC-Predicted Pre % 60  P  FVC-Post L 2.65  P  FVC-Predicted Post % 62  P  Pre FEV1/FVC % % 61  P  Post FEV1/FCV % % 69  P  FEV1-Pre L 1.57  P  FEV1-Predicted Pre % 50  P  FEV1-Post L 1.83  P  DLCO uncorrected ml/min/mmHg 13.30  P  DLCO UNC% % 53  P  DLCO corrected ml/min/mmHg 14.49  P  DLCO COR %Predicted % 58  P  DLVA Predicted % 73  P  TLC L 7.44  P  TLC  % Predicted % 108  P  RV % Predicted % 219  P    P Preliminary result    Labs:  Path:  Echo:  Heart Catheterization:  Assessment & Plan:   COPD with acute exacerbation (HCC)  Cavitary lesion of lung - Plan: Aspergillus Antigen, Serum, AFB Culture & Smear, Respiratory or Resp and Sputum Culture, Respiratory or Resp and Sputum Culture, Aspergillus Antigen, Serum, Fungus Culture with Smear, CANCELED: Respiratory or Resp and Sputum Culture, CANCELED: Fungus Culture with Smear, CANCELED: AFB Culture & Smear, CANCELED: Fungus Culture with Smear  Discussion: Kyle Foster is a 78 year old male, daily smoker with CAD, GERD, parotid gland cancer, squamous cell cancer of lung, prostate cancer, HLD, HTN who retruns to pulmonary clinic for COPD/Emphysema.   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  Follow-up appointment in 6 months.  Dorn Chill, MD [facility] Office: [phone]   Current Outpatient Medications:    acetaminophen  (TYLENOL ) 500 MG tablet, Take 1,000 mg by mouth every 8 (eight) hours as needed for moderate pain., Disp: , Rfl:    albuterol  (VENTOLIN  HFA) 108 (90 Base) MCG/ACT inhaler, Inhale 1 puff into the lungs every 6 (six) hours as needed for wheezing or shortness of breath., Disp: 17 g, Rfl: 3   atorvastatin  (LIPITOR ) 80 MG tablet, TAKE 1 TABLET BY MOUTH EVERY DAY, Disp: 90 tablet, Rfl: 3   B Complex-C (B-COMPLEX WITH VITAMIN C) tablet, Take 1 tablet by mouth daily., Disp: , Rfl:    Budeson-Glycopyrrol-Formoterol (BREZTRI  AEROSPHERE) 160-9-4.8 MCG/ACT AERO, Inhale 2 puffs into the lungs in the morning and at bedtime., Disp: 10.7 g, Rfl: 11   calcium  carbonate (TUMS - DOSED IN MG  ELEMENTAL CALCIUM ) 500 MG chewable tablet, Chew 1 tablet (200 mg of elemental calcium  total) by mouth 3 (three) times daily with meals. (Patient taking differently: Chew 1 tablet by mouth 3 (three) times daily as  needed for indigestion.), Disp: 30 tablet, Rfl: 3   Cholecalciferol  (VITAMIN D ) 2000 UNITS tablet, Take 2,000 Units by mouth daily., Disp: , Rfl:    clopidogrel  (PLAVIX ) 75 MG tablet, TAKE 1 TABLET BY MOUTH EVERY DAY, Disp: 90 tablet, Rfl: 3   dronabinol  (MARINOL ) 2.5 MG capsule, Take 1 capsule (2.5 mg total) by mouth 2 (two) times daily before a meal., Disp: 60 capsule, Rfl: 1   DULoxetine  (CYMBALTA ) 30 MG capsule, TAKE 2 CAPSULES BY MOUTH EVERY DAY, Disp: 180 capsule, Rfl: 2   fentaNYL  (DURAGESIC ) 50 MCG/HR, Place 1 patch onto the skin every 3 (three) days., Disp: 10 patch, Rfl: 0   fluticasone  (FLONASE ) 50 MCG/ACT nasal spray, SPRAY 1 SPRAY INTO BOTH NOSTRILS DAILY., Disp: 48 mL, Rfl: 2   Hypromellose (ARTIFICIAL TEARS OP), Place 1 drop into both eyes as needed., Disp: , Rfl:    ipratropium-albuterol  (DUONEB) 0.5-2.5 (3) MG/3ML SOLN, Take 3 mLs by nebulization 4 (four) times daily as needed., Disp: 360 mL, Rfl: 3   Lactobacillus (PROBIOTIC GOLD EXTRA STRENGTH) CAPS, Take 1 capsule by mouth daily., Disp: , Rfl:    lidocaine -prilocaine  (EMLA ) cream, Apply 1 Application topically as needed (access port)., Disp: 30 g, Rfl: 3   loratadine  (CLARITIN ) 10 MG tablet, Take 10 mg by mouth daily as needed for allergies., Disp: , Rfl:    magnesium  oxide (MAG-OX) 400 MG tablet, Take 1 tablet (400 mg total) by mouth daily., Disp: 90 tablet, Rfl: 0   metoprolol  tartrate (LOPRESSOR ) 25 MG tablet, Take 0.5 tablets (12.5 mg total) by mouth daily., Disp: 45 tablet, Rfl: 3   morphine  (MSIR) 30 MG tablet, Take 0.5 tablets (15 mg total) by mouth every 6 (six) hours as needed for moderate pain (pain score 4-6) or severe pain (pain score 7-10)., Disp: 60 tablet, Rfl: 0   ondansetron  (ZOFRAN ) 8 MG tablet, TAKE 1  TABLET (8 MG TOTAL) BY MOUTH 3 (THREE) TIMES DAILY AS NEEDED., Disp: 90 tablet, Rfl: 2   pantoprazole  (PROTONIX ) 40 MG tablet, TAKE 1 TABLET BY MOUTH EVERY DAY, Disp: 90 tablet, Rfl: 3   Polyvinyl Alcohol  (TEARS AGAIN OP), Place 1 drop into the left eye nightly., Disp: , Rfl:    tamsulosin  (FLOMAX ) 0.4 MG CAPS capsule, Take 1 capsule (0.4 mg total) by mouth daily after supper., Disp: 30 capsule, Rfl: 5 \

## 2024-06-03 LAB — EXTRA SPECIMEN

## 2024-06-03 LAB — ASPERGILLUS ANTIGEN,SERUM

## 2024-06-08 ENCOUNTER — Inpatient Hospital Stay: Attending: Hematology

## 2024-06-08 ENCOUNTER — Inpatient Hospital Stay

## 2024-06-08 ENCOUNTER — Inpatient Hospital Stay (HOSPITAL_BASED_OUTPATIENT_CLINIC_OR_DEPARTMENT_OTHER): Admitting: Hematology

## 2024-06-08 VITALS — BP 102/65 | HR 92 | Temp 98.1°F | Resp 18 | Wt 105.3 lb

## 2024-06-08 DIAGNOSIS — F17210 Nicotine dependence, cigarettes, uncomplicated: Secondary | ICD-10-CM | POA: Diagnosis not present

## 2024-06-08 DIAGNOSIS — C3492 Malignant neoplasm of unspecified part of left bronchus or lung: Secondary | ICD-10-CM | POA: Diagnosis not present

## 2024-06-08 DIAGNOSIS — C61 Malignant neoplasm of prostate: Secondary | ICD-10-CM | POA: Insufficient documentation

## 2024-06-08 DIAGNOSIS — Z5112 Encounter for antineoplastic immunotherapy: Secondary | ICD-10-CM | POA: Insufficient documentation

## 2024-06-08 DIAGNOSIS — C7951 Secondary malignant neoplasm of bone: Secondary | ICD-10-CM | POA: Insufficient documentation

## 2024-06-08 DIAGNOSIS — Z7189 Other specified counseling: Secondary | ICD-10-CM

## 2024-06-08 DIAGNOSIS — Z9221 Personal history of antineoplastic chemotherapy: Secondary | ICD-10-CM | POA: Insufficient documentation

## 2024-06-08 DIAGNOSIS — Z923 Personal history of irradiation: Secondary | ICD-10-CM | POA: Diagnosis not present

## 2024-06-08 DIAGNOSIS — C3412 Malignant neoplasm of upper lobe, left bronchus or lung: Secondary | ICD-10-CM | POA: Insufficient documentation

## 2024-06-08 LAB — CMP (CANCER CENTER ONLY)
ALT: 16 U/L (ref 0–44)
AST: 19 U/L (ref 15–41)
Albumin: 3.7 g/dL (ref 3.5–5.0)
Alkaline Phosphatase: 68 U/L (ref 38–126)
Anion gap: 5 (ref 5–15)
BUN: 27 mg/dL — ABNORMAL HIGH (ref 8–23)
CO2: 31 mmol/L (ref 22–32)
Calcium: 9.4 mg/dL (ref 8.9–10.3)
Chloride: 103 mmol/L (ref 98–111)
Creatinine: 0.87 mg/dL (ref 0.61–1.24)
GFR, Estimated: >60 mL/min (ref >60)
Glucose, Bld: 129 mg/dL — ABNORMAL HIGH (ref 70–99)
Potassium: 4.1 mmol/L (ref 3.5–5.1)
Sodium: 139 mmol/L (ref 135–145)
Total Bilirubin: 0.4 mg/dL (ref 0.0–1.2)
Total Protein: 7 g/dL (ref 6.5–8.1)

## 2024-06-08 LAB — CBC WITH DIFFERENTIAL (CANCER CENTER ONLY)
Abs Immature Granulocytes: 0.01 K/uL (ref 0.00–0.07)
Basophils Absolute: 0 K/uL (ref 0.0–0.1)
Basophils Relative: 0 %
Eosinophils Absolute: 0.1 K/uL (ref 0.0–0.5)
Eosinophils Relative: 1 %
HCT: 33.1 % — ABNORMAL LOW (ref 39.0–52.0)
Hemoglobin: 11 g/dL — ABNORMAL LOW (ref 13.0–17.0)
Immature Granulocytes: 0 %
Lymphocytes Relative: 4 %
Lymphs Abs: 0.4 K/uL — ABNORMAL LOW (ref 0.7–4.0)
MCH: 31 pg (ref 26.0–34.0)
MCHC: 33.2 g/dL (ref 30.0–36.0)
MCV: 93.2 fL (ref 80.0–100.0)
Monocytes Absolute: 0.6 K/uL (ref 0.1–1.0)
Monocytes Relative: 7 %
Neutro Abs: 8.3 K/uL — ABNORMAL HIGH (ref 1.7–7.7)
Neutrophils Relative %: 88 %
Platelet Count: 183 K/uL (ref 150–400)
RBC: 3.55 MIL/uL — ABNORMAL LOW (ref 4.22–5.81)
RDW: 15.5 % (ref 11.5–15.5)
WBC Count: 9.4 K/uL (ref 4.0–10.5)
nRBC: 0 % (ref 0.0–0.2)

## 2024-06-08 LAB — TSH: TSH: 1.7 u[IU]/mL (ref 0.350–4.500)

## 2024-06-08 MED ORDER — SODIUM CHLORIDE 0.9 % IV SOLN
200.0000 mg | Freq: Once | INTRAVENOUS | Status: AC
  Administered 2024-06-08: 200 mg via INTRAVENOUS
  Filled 2024-06-08: qty 200

## 2024-06-08 MED ORDER — SODIUM CHLORIDE 0.9 % IV SOLN: Freq: Once | INTRAVENOUS | Status: AC

## 2024-06-08 MED ORDER — FAMOTIDINE 20 MG PO TABS
20.0000 mg | ORAL_TABLET | Freq: Once | ORAL | Status: AC
  Administered 2024-06-08: 20 mg via ORAL
  Filled 2024-06-08: qty 1

## 2024-06-08 MED ORDER — DIPHENHYDRAMINE HCL 25 MG PO CAPS
25.0000 mg | ORAL_CAPSULE | Freq: Once | ORAL | Status: AC
  Administered 2024-06-08: 25 mg via ORAL
  Filled 2024-06-08: qty 1
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
## 2024-06-08 NOTE — Patient Instructions (Signed)

## 2024-06-09 LAB — T4: T4, Total: 8.6 ug/dL (ref 4.5–12.0)

## 2024-06-14 ENCOUNTER — Encounter: Payer: Self-pay | Admitting: Hematology

## 2024-06-15 ENCOUNTER — Other Ambulatory Visit: Payer: Self-pay | Admitting: Hematology

## 2024-06-19 ENCOUNTER — Encounter: Payer: Self-pay | Admitting: Pulmonary Disease

## 2024-06-22 ENCOUNTER — Encounter: Payer: Self-pay | Admitting: Hematology

## 2024-06-22 ENCOUNTER — Other Ambulatory Visit: Payer: Self-pay | Admitting: *Deleted

## 2024-06-22 DIAGNOSIS — J4489 Other specified chronic obstructive pulmonary disease: Secondary | ICD-10-CM

## 2024-06-22 NOTE — Telephone Encounter (Signed)
 Dr. Kara, When you return, the patient is asking about the results of his lab work.  I did let him know you have been out of the office and would return tomorrow.  I also let him know that one of his labs had been rejected by the lab and would need to be redrawn.  I asked him to call the office to schedule.  Thank you.

## 2024-06-23 ENCOUNTER — Ambulatory Visit: Payer: Self-pay

## 2024-06-23 ENCOUNTER — Telehealth: Payer: Self-pay

## 2024-06-23 NOTE — Telephone Encounter (Signed)
 Please let Kishawn and his wife know that it doesn't look like his sputum sample was processed from the day of our office visit. I will have our office look into this further. We will likely need a new sputum sample if able to provide to send it out for cultures again.  Kelly/Margie please look into this lab issue and place new sputum culture orders if patient able to come back in to provide sputum sample.  Thanks, JD

## 2024-06-23 NOTE — Telephone Encounter (Signed)
 Copied from CRM [id number]. Topic: Clinical - Lab/Test Results >> Jun 22, 2024  9:56 AM Russell PARAS wrote: Reason for CRM:   Pt's wife is contacting clinic concerning his recent labs and sputum sample, taken in clinic on 9/25. She sent a message through MyChart concerning this issue on 10/12 and has not heard back.  Requested message be sent to MyChart with status update on results  Routing to dr dewald

## 2024-06-24 ENCOUNTER — Other Ambulatory Visit

## 2024-06-24 DIAGNOSIS — J4489 Other specified chronic obstructive pulmonary disease: Secondary | ICD-10-CM

## 2024-06-27 NOTE — Telephone Encounter (Signed)
 Spoke w/ PT VBU.   -NFN

## 2024-06-28 LAB — ASPERGILLUS ANTIGEN,SERUM
Aspergillus Ag, EIA: NOT DETECTED
Index Value: 0.04 (ref <0.50)

## 2024-06-29 ENCOUNTER — Inpatient Hospital Stay

## 2024-06-29 VITALS — BP 105/52 | HR 76 | Temp 98.1°F | Resp 18 | Wt 107.0 lb

## 2024-06-29 DIAGNOSIS — Z7189 Other specified counseling: Secondary | ICD-10-CM

## 2024-06-29 DIAGNOSIS — C3492 Malignant neoplasm of unspecified part of left bronchus or lung: Secondary | ICD-10-CM

## 2024-06-29 DIAGNOSIS — C7951 Secondary malignant neoplasm of bone: Secondary | ICD-10-CM

## 2024-06-29 DIAGNOSIS — Z5112 Encounter for antineoplastic immunotherapy: Secondary | ICD-10-CM | POA: Diagnosis not present

## 2024-06-29 LAB — CBC WITH DIFFERENTIAL (CANCER CENTER ONLY)
Abs Immature Granulocytes: 0.02 K/uL (ref 0.00–0.07)
Basophils Absolute: 0 K/uL (ref 0.0–0.1)
Basophils Relative: 0 %
Eosinophils Absolute: 0.1 K/uL (ref 0.0–0.5)
Eosinophils Relative: 1 %
HCT: 32.5 % — ABNORMAL LOW (ref 39.0–52.0)
Hemoglobin: 10.9 g/dL — ABNORMAL LOW (ref 13.0–17.0)
Immature Granulocytes: 0 %
Lymphocytes Relative: 5 %
Lymphs Abs: 0.4 K/uL — ABNORMAL LOW (ref 0.7–4.0)
MCH: 30.8 pg (ref 26.0–34.0)
MCHC: 33.5 g/dL (ref 30.0–36.0)
MCV: 91.8 fL (ref 80.0–100.0)
Monocytes Absolute: 0.7 K/uL (ref 0.1–1.0)
Monocytes Relative: 8 %
Neutro Abs: 7.2 K/uL (ref 1.7–7.7)
Neutrophils Relative %: 86 %
Platelet Count: 191 K/uL (ref 150–400)
RBC: 3.54 MIL/uL — ABNORMAL LOW (ref 4.22–5.81)
RDW: 15.1 % (ref 11.5–15.5)
WBC Count: 8.4 K/uL (ref 4.0–10.5)
nRBC: 0 % (ref 0.0–0.2)

## 2024-06-29 LAB — CMP (CANCER CENTER ONLY)
ALT: 16 U/L (ref 0–44)
AST: 20 U/L (ref 15–41)
Albumin: 3.6 g/dL (ref 3.5–5.0)
Alkaline Phosphatase: 74 U/L (ref 38–126)
Anion gap: 5 (ref 5–15)
BUN: 23 mg/dL (ref 8–23)
CO2: 31 mmol/L (ref 22–32)
Calcium: 9.8 mg/dL (ref 8.9–10.3)
Chloride: 102 mmol/L (ref 98–111)
Creatinine: 0.93 mg/dL (ref 0.61–1.24)
GFR, Estimated: >60 mL/min (ref >60)
Glucose, Bld: 125 mg/dL — ABNORMAL HIGH (ref 70–99)
Potassium: 4.6 mmol/L (ref 3.5–5.1)
Sodium: 138 mmol/L (ref 135–145)
Total Bilirubin: 0.3 mg/dL (ref 0.0–1.2)
Total Protein: 6.8 g/dL (ref 6.5–8.1)

## 2024-06-29 LAB — FUNGUS CULTURE W SMEAR

## 2024-06-29 MED ORDER — DIPHENHYDRAMINE HCL 25 MG PO CAPS
25.0000 mg | ORAL_CAPSULE | Freq: Once | ORAL | Status: AC
  Administered 2024-06-29: 25 mg via ORAL
  Filled 2024-06-29: qty 1

## 2024-06-29 MED ORDER — SODIUM CHLORIDE 0.9 % IV SOLN: Freq: Once | INTRAVENOUS | Status: AC

## 2024-06-29 MED ORDER — SODIUM CHLORIDE 0.9 % IV SOLN
200.0000 mg | Freq: Once | INTRAVENOUS | Status: AC
  Administered 2024-06-29: 200 mg via INTRAVENOUS
  Filled 2024-06-29: qty 200

## 2024-06-29 MED ORDER — FAMOTIDINE 20 MG PO TABS
20.0000 mg | ORAL_TABLET | Freq: Once | ORAL | Status: AC
  Administered 2024-06-29: 20 mg via ORAL
  Filled 2024-06-29: qty 1

## 2024-06-29 NOTE — Patient Instructions (Signed)
 [facility]   Discharge Instructions: Thank you for choosing [facility] to provide your oncology and hematology care.   If you have a lab appointment with the Cancer Center, please go directly to the Cancer Center and check in at the registration area.   Wear comfortable clothing and clothing appropriate for easy access to any Portacath or PICC line.   We strive to give you quality time with your provider. You may need to reschedule your appointment if you arrive late (15 or more minutes).  Arriving late affects you and other patients whose appointments are after yours.  Also, if you miss three or more appointments without notifying the office, you may be dismissed from the clinic at the provider's discretion.      For prescription refill requests, have your pharmacy contact our office and allow 72 hours for refills to be completed.    Today you received the following chemotherapy and/or immunotherapy agents: Pembrolizumab Rande Lawman)      To help prevent nausea and vomiting after your treatment, we encourage you to take your nausea medication as directed.  BELOW ARE SYMPTOMS THAT SHOULD BE REPORTED IMMEDIATELY: *FEVER GREATER THAN 100.4 F (38 C) OR HIGHER *CHILLS OR SWEATING *NAUSEA AND VOMITING THAT IS NOT CONTROLLED WITH YOUR NAUSEA MEDICATION *UNUSUAL SHORTNESS OF BREATH *UNUSUAL BRUISING OR BLEEDING *URINARY PROBLEMS (pain or burning when urinating, or frequent urination) *BOWEL PROBLEMS (unusual diarrhea, constipation, pain near the anus) TENDERNESS IN MOUTH AND THROAT WITH OR WITHOUT PRESENCE OF ULCERS (sore throat, sores in mouth, or a toothache) UNUSUAL RASH, SWELLING OR PAIN  UNUSUAL VAGINAL DISCHARGE OR ITCHING   Items with * indicate a potential emergency and should be followed up as soon as possible or go to the Emergency Department if any problems should occur.  Please show the CHEMOTHERAPY ALERT CARD  or IMMUNOTHERAPY ALERT CARD at check-in to the Emergency Department and triage nurse.  Should you have questions after your visit or need to cancel or reschedule your appointment, please contact [facility]  Dept: [phone]  and follow the prompts.  Office hours are 8:00 a.m. to 4:30 p.m. Monday - Friday. Please note that voicemails left after 4:00 p.m. may not be returned until the following business day.  We are closed weekends and major holidays. You have access to a nurse at all times for urgent questions. Please call the main number to the clinic Dept: [phone] and follow the prompts.   For any non-urgent questions, you may also contact your provider using MyChart. We now offer e-Visits for anyone 75 and older to request care online for non-urgent symptoms. For details visit [email or web address].   Also download the MyChart app! Go to the app store, search "MyChart", open the app, select , and log in with your MyChart username and password.

## 2024-06-29 NOTE — Progress Notes (Signed)
 Bp low, pt asymptomatic per verbal order dr Onesimo ok to treat, no additional fluids needed

## 2024-06-30 ENCOUNTER — Other Ambulatory Visit (HOSPITAL_BASED_OUTPATIENT_CLINIC_OR_DEPARTMENT_OTHER): Payer: Self-pay | Admitting: Family Medicine

## 2024-06-30 ENCOUNTER — Other Ambulatory Visit: Payer: Self-pay | Admitting: Pulmonary Disease

## 2024-06-30 DIAGNOSIS — J441 Chronic obstructive pulmonary disease with (acute) exacerbation: Secondary | ICD-10-CM

## 2024-06-30 DIAGNOSIS — J4489 Other specified chronic obstructive pulmonary disease: Secondary | ICD-10-CM

## 2024-07-05 ENCOUNTER — Other Ambulatory Visit: Payer: Self-pay

## 2024-07-05 DIAGNOSIS — C3492 Malignant neoplasm of unspecified part of left bronchus or lung: Secondary | ICD-10-CM

## 2024-07-07 ENCOUNTER — Encounter: Payer: Self-pay | Admitting: Hematology

## 2024-07-07 ENCOUNTER — Other Ambulatory Visit: Payer: Self-pay | Admitting: Hematology

## 2024-07-07 DIAGNOSIS — C3492 Malignant neoplasm of unspecified part of left bronchus or lung: Secondary | ICD-10-CM

## 2024-07-07 MED ORDER — MORPHINE SULFATE 30 MG PO TABS: 15.0000 mg | ORAL_TABLET | Freq: Four times a day (QID) | ORAL | 0 refills | Status: DC | PRN

## 2024-07-07 MED ORDER — FENTANYL 50 MCG/HR TD PT72: 1.0000 | MEDICATED_PATCH | TRANSDERMAL | 0 refills | Status: DC

## 2024-07-20 ENCOUNTER — Inpatient Hospital Stay (HOSPITAL_BASED_OUTPATIENT_CLINIC_OR_DEPARTMENT_OTHER): Admitting: Hematology

## 2024-07-20 ENCOUNTER — Inpatient Hospital Stay: Attending: Hematology

## 2024-07-20 ENCOUNTER — Other Ambulatory Visit: Payer: Self-pay | Admitting: Hematology

## 2024-07-20 ENCOUNTER — Inpatient Hospital Stay

## 2024-07-20 VITALS — BP 105/66 | HR 77 | Temp 97.9°F | Resp 18 | Wt 107.3 lb

## 2024-07-20 DIAGNOSIS — Z7189 Other specified counseling: Secondary | ICD-10-CM

## 2024-07-20 DIAGNOSIS — C7951 Secondary malignant neoplasm of bone: Secondary | ICD-10-CM

## 2024-07-20 DIAGNOSIS — C3492 Malignant neoplasm of unspecified part of left bronchus or lung: Secondary | ICD-10-CM

## 2024-07-20 DIAGNOSIS — Z5112 Encounter for antineoplastic immunotherapy: Secondary | ICD-10-CM | POA: Insufficient documentation

## 2024-07-20 DIAGNOSIS — C3412 Malignant neoplasm of upper lobe, left bronchus or lung: Secondary | ICD-10-CM

## 2024-07-20 DIAGNOSIS — Z95828 Presence of other vascular implants and grafts: Secondary | ICD-10-CM

## 2024-07-20 DIAGNOSIS — Z85818 Personal history of malignant neoplasm of other sites of lip, oral cavity, and pharynx: Secondary | ICD-10-CM | POA: Insufficient documentation

## 2024-07-20 DIAGNOSIS — Z86711 Personal history of pulmonary embolism: Secondary | ICD-10-CM | POA: Insufficient documentation

## 2024-07-20 DIAGNOSIS — Z923 Personal history of irradiation: Secondary | ICD-10-CM | POA: Insufficient documentation

## 2024-07-20 DIAGNOSIS — C61 Malignant neoplasm of prostate: Secondary | ICD-10-CM | POA: Insufficient documentation

## 2024-07-20 DIAGNOSIS — F17210 Nicotine dependence, cigarettes, uncomplicated: Secondary | ICD-10-CM | POA: Insufficient documentation

## 2024-07-20 DIAGNOSIS — Z8551 Personal history of malignant neoplasm of bladder: Secondary | ICD-10-CM | POA: Insufficient documentation

## 2024-07-20 DIAGNOSIS — Z9221 Personal history of antineoplastic chemotherapy: Secondary | ICD-10-CM | POA: Insufficient documentation

## 2024-07-20 DIAGNOSIS — G893 Neoplasm related pain (acute) (chronic): Secondary | ICD-10-CM | POA: Insufficient documentation

## 2024-07-20 DIAGNOSIS — Z85828 Personal history of other malignant neoplasm of skin: Secondary | ICD-10-CM | POA: Insufficient documentation

## 2024-07-20 LAB — CMP (CANCER CENTER ONLY)
ALT: 15 U/L (ref 0–44)
AST: 21 U/L (ref 15–41)
Albumin: 3.7 g/dL (ref 3.5–5.0)
Alkaline Phosphatase: 63 U/L (ref 38–126)
Anion gap: 6 (ref 5–15)
BUN: 22 mg/dL (ref 8–23)
CO2: 31 mmol/L (ref 22–32)
Calcium: 9.7 mg/dL (ref 8.9–10.3)
Chloride: 101 mmol/L (ref 98–111)
Creatinine: 0.79 mg/dL (ref 0.61–1.24)
GFR, Estimated: >60 mL/min (ref >60)
Glucose, Bld: 103 mg/dL — ABNORMAL HIGH (ref 70–99)
Potassium: 4.2 mmol/L (ref 3.5–5.1)
Sodium: 138 mmol/L (ref 135–145)
Total Bilirubin: 0.6 mg/dL (ref 0.0–1.2)
Total Protein: 7.2 g/dL (ref 6.5–8.1)

## 2024-07-20 LAB — CBC WITH DIFFERENTIAL (CANCER CENTER ONLY)
Abs Immature Granulocytes: 0.04 K/uL (ref 0.00–0.07)
Basophils Absolute: 0 K/uL (ref 0.0–0.1)
Basophils Relative: 0 %
Eosinophils Absolute: 0.1 K/uL (ref 0.0–0.5)
Eosinophils Relative: 2 %
HCT: 33 % — ABNORMAL LOW (ref 39.0–52.0)
Hemoglobin: 11.1 g/dL — ABNORMAL LOW (ref 13.0–17.0)
Immature Granulocytes: 1 %
Lymphocytes Relative: 5 %
Lymphs Abs: 0.4 K/uL — ABNORMAL LOW (ref 0.7–4.0)
MCH: 31.1 pg (ref 26.0–34.0)
MCHC: 33.6 g/dL (ref 30.0–36.0)
MCV: 92.4 fL (ref 80.0–100.0)
Monocytes Absolute: 0.6 K/uL (ref 0.1–1.0)
Monocytes Relative: 7 %
Neutro Abs: 7.2 K/uL (ref 1.7–7.7)
Neutrophils Relative %: 85 %
Platelet Count: 181 K/uL (ref 150–400)
RBC: 3.57 MIL/uL — ABNORMAL LOW (ref 4.22–5.81)
RDW: 15.7 % — ABNORMAL HIGH (ref 11.5–15.5)
WBC Count: 8.4 K/uL (ref 4.0–10.5)
nRBC: 0 % (ref 0.0–0.2)

## 2024-07-20 MED ORDER — FAMOTIDINE 20 MG PO TABS
20.0000 mg | ORAL_TABLET | Freq: Once | ORAL | Status: AC
  Administered 2024-07-20: 20 mg via ORAL
  Filled 2024-07-20: qty 1

## 2024-07-20 MED ORDER — DIPHENHYDRAMINE HCL 25 MG PO CAPS
25.0000 mg | ORAL_CAPSULE | Freq: Once | ORAL | Status: AC
  Administered 2024-07-20: 25 mg via ORAL
  Filled 2024-07-20: qty 1

## 2024-07-20 MED ORDER — SODIUM CHLORIDE 0.9% FLUSH
10.0000 mL | INTRAVENOUS | Status: DC | PRN
  Administered 2024-07-20: 10 mL

## 2024-07-20 MED ORDER — SODIUM CHLORIDE 0.9% FLUSH: 10.0000 mL | INTRAVENOUS | Status: DC | PRN

## 2024-07-20 MED ORDER — SODIUM CHLORIDE 0.9 % IV SOLN: Freq: Once | INTRAVENOUS | Status: AC

## 2024-07-20 MED ORDER — SODIUM CHLORIDE 0.9 % IV SOLN
200.0000 mg | Freq: Once | INTRAVENOUS | Status: AC
  Administered 2024-07-20: 200 mg via INTRAVENOUS
  Filled 2024-07-20: qty 200

## 2024-07-20 NOTE — Patient Instructions (Signed)
 [facility]   Discharge Instructions: Thank you for choosing [facility] to provide your oncology and hematology care.   If you have a lab appointment with the Cancer Center, please go directly to the Cancer Center and check in at the registration area.   Wear comfortable clothing and clothing appropriate for easy access to any Portacath or PICC line.   We strive to give you quality time with your provider. You may need to reschedule your appointment if you arrive late (15 or more minutes).  Arriving late affects you and other patients whose appointments are after yours.  Also, if you miss three or more appointments without notifying the office, you may be dismissed from the clinic at the provider's discretion.      For prescription refill requests, have your pharmacy contact our office and allow 72 hours for refills to be completed.    Today you received the following chemotherapy and/or immunotherapy agents: Pembrolizumab Kyle Foster)      To help prevent nausea and vomiting after your treatment, we encourage you to take your nausea medication as directed.  BELOW ARE SYMPTOMS THAT SHOULD BE REPORTED IMMEDIATELY: *FEVER GREATER THAN 100.4 F (38 C) OR HIGHER *CHILLS OR SWEATING *NAUSEA AND VOMITING THAT IS NOT CONTROLLED WITH YOUR NAUSEA MEDICATION *UNUSUAL SHORTNESS OF BREATH *UNUSUAL BRUISING OR BLEEDING *URINARY PROBLEMS (pain or burning when urinating, or frequent urination) *BOWEL PROBLEMS (unusual diarrhea, constipation, pain near the anus) TENDERNESS IN MOUTH AND THROAT WITH OR WITHOUT PRESENCE OF ULCERS (sore throat, sores in mouth, or a toothache) UNUSUAL RASH, SWELLING OR PAIN  UNUSUAL VAGINAL DISCHARGE OR ITCHING   Items with * indicate a potential emergency and should be followed up as soon as possible or go to the Emergency Department if any problems should occur.  Please show the CHEMOTHERAPY ALERT CARD  or IMMUNOTHERAPY ALERT CARD at check-in to the Emergency Department and triage nurse.  Should you have questions after your visit or need to cancel or reschedule your appointment, please contact [facility]  Dept: [phone]  and follow the prompts.  Office hours are 8:00 a.m. to 4:30 p.m. Monday - Friday. Please note that voicemails left after 4:00 p.m. may not be returned until the following business day.  We are closed weekends and major holidays. You have access to a nurse at all times for urgent questions. Please call the main number to the clinic Dept: [phone] and follow the prompts.   For any non-urgent questions, you may also contact your provider using MyChart. We now offer e-Visits for anyone 75 and older to request care online for non-urgent symptoms. For details visit [email or web address].   Also download the MyChart app! Go to the app store, search "MyChart", open the app, select , and log in with your MyChart username and password.

## 2024-07-20 NOTE — Progress Notes (Signed)
 HEMATOLOGY/ONCOLOGY CLINIC NOTE   de Foster, Kyle J, MD [address] [place] [address]  Date of Service: 07/20/2024  CC: Follow-up for continued evaluation and management of stage IV squamous cell lung cancer and newly diagnosed prostatic adenocarcinoma.   HISTORY OF PRESENTING ILLNESS: (11/09/2018) Kyle Foster is a wonderful 78 y.o. male who has been referred to us  by Dr. Brigida Bureau for evaluation and management of Lung Mass concerning for primary lung cancer.    he pt reports he has been having back pain on and off for about 3 months and was being worked up at the [facility] in [place] [employer] by his PCP . He reports it was being maanged as MSK pain and he was referred to PT but the symptoms got progressively worse. He presented to the ED on 10/14/18 with SOB and midsternal chest pain, neck pain, and back pain which had been constant for the last 2-3 weeks.    Of note prior to the patient's visit today, pt has had a CTA Chest completed on 10/14/18 with results revealing 5.6 cm left upper lobe lesion with chest wall and thoracic spine invasion, possible epidural extension of tumor. 2. 2.7 cm left hilar mass occluding the left lower lobe pulmonary artery branch and probably attenuating or occluding the inferior left pulmonary vein. 3. Left lower lobe pulmonary nodules possibly metastatic. 4. Negative for acute PE or thoracic aortic dissection. 5. Coronary and Aortic Atherosclerosis.   Most recent lab results (10/15/18) of CBC is as follows: all values are WNL except for RBC at 3.76, [name], [name], Glucose at 111.   He subsequently had an MRI of the T spine which showed Large cavitary mass arising in the superior aspect of the left hilum and extending into the posteromedial aspect of the left upper lobe invading the left side of the T5 and T6 vertebra and extending into the spinal canal with a slight mass effect upon the spinal cord. There is a slight pathologic compression fracture  of the T6 vertebral body. 2. Probable small metastasis in the T8 vertebral body. 3. Metastatic pulmonary nodules at the left lung base posterolaterally. 4. The mass destroys the posterior aspects of the left fifth and sixth ribs.   Patient has had a h/o localized bladder cancer s/p TURBT in 2009. 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    Kyle Foster returns today for management and evaluation of his Squamous Cell Carcinoma Lung Cancer. I last saw the pt on 10/18/18 as an inpatient. He is accompanied today by his wife. The pt reports that he is doing well overall.    The pt reports that his back pain has improved after recent completing RT of 30Gy over 10 fractions. He notes that he is taking long acting morphine  every 8 hours. Short acting BID for break through pain. With this regimen, the pt notes that his pain control is fine. He notes that most of his pain is localized to his upper left back. He denies the pain radiating into his legs.   The pt notes that his breathing is fine, and began feeling more SOB than his baseline, three weeks prior to his recent hospital admission. He endorses thick secretions, occasionally with streaks of blood. he notes that he coughs more when he breathes through his mouth.   The pt notes that she has had recent constipation. He  endorses a low appetite and has been losing weight, and notes that he is not eating well. He notes that he is eating at least half as much as he used to. He is mostly eating soups, which feels good on his throat.    The pt notes that he is not planning on having any dental extractions and denies any dental pains.   The pt has had a history of skin squamous cells.    Most recent lab results (10/22/18) of CBC w/diff and BMP is as follows: all values are WNL except for RBC at 4.10, HGB at 12.9, Eosinophils abs at 800, BUN at 27, Calcium  at 10.4.   On review of systems,  pt reports stable breathing, controlled left upper back pain, stable energy levels, some coughing with occasional streaks of blood, constipation, weakened appetite, weight loss, and denies leg pain, and any other symptoms.   DIAGNOSIS: Stage IV lung cancer-squamous cell carcinoma  SUMMARY OF ONCOLOGIC HISTORY: Oncology History  Metastatic cancer (HCC)  10/19/2018 Initial Diagnosis   Metastatic cancer (HCC)   11/24/2018 - 06/04/2022 Chemotherapy   Patient is on Treatment Plan : LUNG NSCLC Carboplatin  + Paclitaxel  + Pembrolizumab  q21d x 4 cycles / Pembrolizumab  Maintenance Q21D     07/16/2022 -  Chemotherapy   Patient is on Treatment Plan : LUNG NSCLC Pembrolizumab  (200) q21d     Malignant neoplasm metastatic to bone (HCC)  10/19/2018 Initial Diagnosis   Bone metastases (HCC)   11/24/2018 - 06/04/2022 Chemotherapy   The patient had dexamethasone  (DECADRON ) 4 MG tablet, 1 of 1 cycle, Start date: 11/24/2018, End date: 04/27/2019 palonosetron  (ALOXI ) injection 0.25 mg, 0.25 mg, Intravenous,  Once, 5 of 5 cycles Administration: 0.25 mg (11/24/2018), 0.25 mg (12/15/2018), 0.25 mg (01/05/2019), 0.25 mg (01/26/2019), 0.25 mg (02/16/2019) pegfilgrastim  (NEULASTA  ONPRO KIT) injection 6 mg, 6 mg, Subcutaneous, Once, 3 of 3 cycles Administration: 6 mg (01/05/2019), 6 mg (01/26/2019), 6 mg (02/16/2019) pegfilgrastim -cbqv (UDENYCA ) injection 6 mg, 6 mg, Subcutaneous, Once, 2 of 2 cycles Administration: 6 mg (11/26/2018), 6 mg (12/17/2018) CARBOplatin  (PARAPLATIN ) 410 mg in sodium chloride  0.9 % 250 mL chemo infusion, 410 mg (108 % of original dose 381.5 mg), Intravenous,  Once, 5 of 5 cycles Dose modification:   (original dose 381.5 mg, Cycle 1) Administration: 410 mg (11/24/2018), 410 mg (12/15/2018), 410 mg (01/05/2019), 410 mg (01/26/2019), 410 mg (02/16/2019) PACLitaxel  (TAXOL ) 234 mg in sodium chloride  0.9 % 250 mL chemo infusion (> 80mg /m2), 135 mg/m2 = 234 mg (100 % of original dose 135 mg/m2), Intravenous,  Once, 5 of  5 cycles Dose modification: 150 mg/m2 (original dose 135 mg/m2, Cycle 1, Reason: Provider Judgment), 135 mg/m2 (original dose 135 mg/m2, Cycle 1, Reason: Provider Judgment), 150 mg/m2 (original dose 135 mg/m2, Cycle 2, Reason: Catheter Related Infection) Administration: 234 mg (11/24/2018), 258 mg (12/15/2018), 258 mg (01/05/2019), 258 mg (01/26/2019), 258 mg (02/16/2019) pembrolizumab  (KEYTRUDA ) 200 mg in sodium chloride  0.9 % 50 mL chemo infusion, 200 mg, Intravenous, Once, 27 of 29 cycles Administration: 200 mg (11/24/2018), 200 mg (12/15/2018), 200 mg (01/05/2019), 200 mg (01/26/2019), 200 mg (02/16/2019), 200 mg (03/09/2019), 200 mg (03/30/2019), 200 mg (04/20/2019), 200 mg (05/11/2019), 200 mg (06/01/2019), 200 mg (06/22/2019), 200 mg (07/13/2019), 200 mg (08/03/2019), 200 mg (09/14/2019), 200 mg (08/24/2019), 200 mg (10/05/2019), 200 mg (10/26/2019), 200 mg (11/16/2019), 200 mg (12/07/2019), 200 mg (12/28/2019), 200 mg (01/18/2020), 200 mg (02/08/2020), 200 mg (02/29/2020), 200 mg (03/21/2020), 200 mg (04/11/2020), 200 mg (05/02/2020), 200 mg (05/24/2020) fosaprepitant  (EMEND ) 150  mg, dexamethasone  (DECADRON ) 12 mg in sodium chloride  0.9 % 145 mL IVPB, , Intravenous,  Once, 5 of 5 cycles Administration:  (11/24/2018),  (12/15/2018),  (01/05/2019),  (01/26/2019),  (02/16/2019)  for chemotherapy treatment.    07/16/2022 -  Chemotherapy   Patient is on Treatment Plan : LUNG NSCLC Pembrolizumab  (200) q21d     Squamous cell lung cancer, left (HCC)  11/24/2018 Initial Diagnosis   Squamous cell lung cancer, left (HCC)   11/24/2018 - 06/04/2022 Chemotherapy   The patient had dexamethasone  (DECADRON ) 4 MG tablet, 1 of 1 cycle, Start date: 11/24/2018, End date: 04/27/2019 palonosetron  (ALOXI ) injection 0.25 mg, 0.25 mg, Intravenous,  Once, 5 of 5 cycles Administration: 0.25 mg (11/24/2018), 0.25 mg (12/15/2018), 0.25 mg (01/05/2019), 0.25 mg (01/26/2019), 0.25 mg (02/16/2019) pegfilgrastim  (NEULASTA  ONPRO KIT) injection 6 mg, 6 mg, Subcutaneous, Once,  3 of 3 cycles Administration: 6 mg (01/05/2019), 6 mg (01/26/2019), 6 mg (02/16/2019) pegfilgrastim -cbqv (UDENYCA ) injection 6 mg, 6 mg, Subcutaneous, Once, 2 of 2 cycles Administration: 6 mg (11/26/2018), 6 mg (12/17/2018) CARBOplatin  (PARAPLATIN ) 410 mg in sodium chloride  0.9 % 250 mL chemo infusion, 410 mg (108 % of original dose 381.5 mg), Intravenous,  Once, 5 of 5 cycles Dose modification:   (original dose 381.5 mg, Cycle 1) Administration: 410 mg (11/24/2018), 410 mg (12/15/2018), 410 mg (01/05/2019), 410 mg (01/26/2019), 410 mg (02/16/2019) PACLitaxel  (TAXOL ) 234 mg in sodium chloride  0.9 % 250 mL chemo infusion (> 80mg /m2), 135 mg/m2 = 234 mg (100 % of original dose 135 mg/m2), Intravenous,  Once, 5 of 5 cycles Dose modification: 150 mg/m2 (original dose 135 mg/m2, Cycle 1, Reason: Provider Judgment), 135 mg/m2 (original dose 135 mg/m2, Cycle 1, Reason: Provider Judgment), 150 mg/m2 (original dose 135 mg/m2, Cycle 2, Reason: Catheter Related Infection) Administration: 234 mg (11/24/2018), 258 mg (12/15/2018), 258 mg (01/05/2019), 258 mg (01/26/2019), 258 mg (02/16/2019) pembrolizumab  (KEYTRUDA ) 200 mg in sodium chloride  0.9 % 50 mL chemo infusion, 200 mg, Intravenous, Once, 27 of 29 cycles Administration: 200 mg (11/24/2018), 200 mg (12/15/2018), 200 mg (01/05/2019), 200 mg (01/26/2019), 200 mg (02/16/2019), 200 mg (03/09/2019), 200 mg (03/30/2019), 200 mg (04/20/2019), 200 mg (05/11/2019), 200 mg (06/01/2019), 200 mg (06/22/2019), 200 mg (07/13/2019), 200 mg (08/03/2019), 200 mg (09/14/2019), 200 mg (08/24/2019), 200 mg (10/05/2019), 200 mg (10/26/2019), 200 mg (11/16/2019), 200 mg (12/07/2019), 200 mg (12/28/2019), 200 mg (01/18/2020), 200 mg (02/08/2020), 200 mg (02/29/2020), 200 mg (03/21/2020), 200 mg (04/11/2020), 200 mg (05/02/2020), 200 mg (05/24/2020) fosaprepitant  (EMEND ) 150 mg, dexamethasone  (DECADRON ) 12 mg in sodium chloride  0.9 % 145 mL IVPB, , Intravenous,  Once, 5 of 5 cycles Administration:  (11/24/2018),  (12/15/2018),   (01/05/2019),  (01/26/2019),  (02/16/2019)  for chemotherapy treatment.    07/16/2022 -  Chemotherapy   Patient is on Treatment Plan : LUNG NSCLC Pembrolizumab  (200) q21d     Squamous cell carcinoma of lung, stage IV (HCC)  02/19/2020 Initial Diagnosis   Squamous cell carcinoma of lung, stage IV (HCC)   07/29/2023 Cancer Staging   Staging form: Lung, AJCC 8th Edition - Clinical: Stage IVB (pM1c) - Signed by Onesimo Emaline Brink, MD on 07/29/2023   Malignant neoplasm of prostate (HCC)  04/10/2022 Cancer Staging   Staging form: Prostate, AJCC 8th Edition - Clinical stage from 04/10/2022: Stage IIC (cT2b, cN0, cM0, PSA: 5.4, Grade Group: 3) - Signed by Sherwood Rise, PA-C on 05/15/2022 Histopathologic type: Adenocarcinoma, NOS Stage prefix: Initial diagnosis Prostate specific antigen (PSA) range: Less than 10 Gleason primary pattern: 4 Gleason  secondary pattern: 3 Gleason score: 7 Histologic grading system: 5 grade system Number of biopsy cores examined: 12 Number of biopsy cores positive: 6 Location of positive needle core biopsies: Both sides   05/15/2022 Initial Diagnosis   Malignant neoplasm of prostate (HCC)    CURRENT THERAPY: Keytruda  every 3 weeks  INTERVAL HISTORY:  Kyle Foster is a wonderful 78 y.o. male for continued evaluation and management of his metastatic squamous cell carcinoma of the lung.  Last seen by me on 06/08/2024 and was doing well.   Bronwyn says that he is doing well. 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 He has not noticed any shortness of breath, cough, fever, back pains, headaches, change in vision, leg swelling, or unexpected weight changes. No further plans for this as far as he knows  States that he has seen Dermatology and had some lesions removed.   Denies any ear pain, which is being followed by ENT q3 months for monitoring of his hx of carcinoma of parotid gland.  Reports being UTD on most age-recommended vaccines,  except for Tdap.  He reports he is tolerating pembrolizumab  without diarrhea or skin rashes.  Patient Active Problem List   Diagnosis Date Noted   Encounter for fitting and adjustment of hearing aid 08/03/2023   COPD with acute exacerbation (HCC) 04/01/2023   Impacted cerumen 04/01/2023   Shortness of breath 01/26/2023   Anxiety 07/10/2022   Basal cell carcinoma of skin 07/10/2022   Cataract 07/10/2022   Inguinal hernia 07/10/2022   Malignant neoplasm of prostate (HCC) 05/15/2022   Port-A-Cath in place 04/02/2022   Chronic diffuse otitis externa of left ear 10/25/2021   Recurrent productive cough 06/28/2021   Squamous cell carcinoma of lung, stage IV (HCC) 02/19/2020   GERD without esophagitis 02/19/2020   Coronary artery disease involving native coronary artery of native heart without angina pectoris 02/19/2020   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   H/O agent Orange exposure 03/22/2019   COPD with chronic bronchitis and emphysema (HCC) 03/21/2019   Squamous cell lung cancer, left (HCC) 11/24/2018   Counseling regarding advance care planning and goals of care 11/24/2018   Metastatic cancer (HCC)    Malignant neoplasm of upper lobe of left lung (HCC)    Malignant neoplasm metastatic to bone (HCC)    Severe protein-calorie malnutrition 10/15/2018   Palliative care by specialist    Encounter for medical examination to establish care    Cancer associated pain 10/14/2018   Facial paralysis on left side 12/24/2015   CAD S/P percutaneous coronary angioplasty 07/13/2013   Essential hypertension 07/13/2013   Mixed hyperlipidemia 07/13/2013   Tobacco abuse 07/13/2013   Coronary artery disease 07/13/2013   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   Carcinoma of parotid gland (HCC) 06/25/2011   Immunization History  Administered Date(s) Administered   Fluad Quad(high Dose 65+) 05/02/2019, 05/24/2020, 06/11/2021   Fluad  Trivalent(High Dose 65+) 10/06/2017   INFLUENZA, HIGH DOSE SEASONAL PF 08/14/2016, 10/06/2017, 05/24/2018, 06/08/2018   Influenza, Quadrivalent, Recombinant, Inj, Pf 06/24/2022   Influenza-Unspecified 07/11/2013, 06/22/2014, 08/14/2015, 06/24/2022   Moderna Covid-19 Fall Seasonal Vaccine 72yrs & older 09/29/2023   Moderna Covid-19 Vaccine Bivalent Booster 9yrs & up 07/04/2021   Moderna Sars-Covid-2 Vaccination 10/14/2019, 11/21/2019, 04/23/2020, 01/03/2021   Pfizer(Comirnaty )Fall Seasonal Vaccine 12 years and older 09/25/2022   Pneumococcal Conjugate-13 06/22/2014, 03/01/2020   Pneumococcal Polysaccharide-23 05/03/2013, 05/24/2020   Tdap 05/03/2013   Zoster Recombinant(Shingrix)  10/06/2017, 01/19/2018   is allergic to codeine.  MEDICAL HISTORY: 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    GERD (gastroesophageal reflux disease)    History of cancer chemotherapy    History of external beam radiation therapy    completed radiation for parotid cancer 2011;   and had radiation for lung cancer completed 02/ 2020   History of kidney stones    History of MI (myocardial infarction) 06/2008   History of primary bladder cancer 10/2008   s/ p   TURBT,  TCC   History of skin cancer    cut & burned off arms, hands, face, neck   Hyperlipidemia    Hypertension    Left ear pain 07/08/2021   Left lower lobe pneumonia 02/20/2020   Maintenance antineoplastic immunotherapy    Malignant neoplasm metastatic to bone [facility])    Malignant neoplasm prostate (HCC)    Myocardial infarction  (HCC) 06/2008   Osteoradionecrosis of temporal bone (HCC)    followed by ID   Persistent cough for 3 weeks or longer 07/08/2021   Pulmonary infarct (HCC) 02/19/2020   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 immunotherapy;   mets to bone,  stage IV,  completed radiation 11-03-2018,  chemotherapy ongoing since 11-24-2018    SURGICAL HISTORY: Past Surgical History:  Procedure Laterality Date   CATARACT EXTRACTION W/ INTRAOCULAR LENS IMPLANT Right 12/2007   CORONARY ANGIOPLASTY WITH STENT PLACEMENT  07/03/2008   @[facility]  by dr berry;   BMS x3 to AV groove LCFx and marginal branch biforcation ,  normal LVF,  RCA 70%   CYSTOSCOPY W/ RETROGRADES  09/22/2011   Procedure: CYSTOSCOPY WITH RETROGRADE PYELOGRAM;  Surgeon: Alm GORMAN Fragmin, MD;  Location: [facility];  Service: Urology;  Laterality: Left;  Cystoscopy left Retrograde Pyelogram      (c-arm)    CYSTOSCOPY WITH BIOPSY  09/22/2011   Procedure: CYSTOSCOPY WITH BIOPSY;  Surgeon: Alm GORMAN Fragmin, MD;  Location: [facility];  Service: Urology;  Laterality: N/A;   Biopsy   CYSTOSCOPY WITH BIOPSY N/A 04/19/2021   Procedure: CYSTOSCOPY WITH BLADDER  BIOPSY WITH FULGERATION;  Surgeon: Nieves Cough, MD;  Location: [facility];  Service: Urology;  Laterality: N/A;   EXCISIONAL HEMORRHOIDECTOMY  03/15/2002   @[facility]    EYE SURGERY  04/2011   FOOT NEUROMA SURGERY Left 2005   GOLD SEED IMPLANT N/A 07/08/2022   Procedure: GOLD SEED IMPLANT;  Surgeon: Carolee Sherwood JONETTA DOUGLAS, MD;  Location: [facility];  Service: Urology;  Laterality: N/A;  30 MINS FOR CASE   INGUINAL HERNIA REPAIR Right 02/2018   IR IMAGING GUIDED PORT INSERTION  11/23/2018   PAROTIDECTOMY W/ NECK DISSECTION TOTAL  12/18/2009   @[facility]  by dr graig;   TOTAL LEFT PAROTIDECTOMY/   LEFT MASTOIDECTOMY/    LEFT SELECTIVE NECK DISSECTIONS/     STERNOCLEIDOMASTOID FLAP RECONSTRUCTION/  GOLD WEIGT IMPANT LEFT UPPER EYELID   SALIVARY GLAND SURGERY Left 11/02/2009   @[facility]  by dr ethyl;  LEFT SUPERFICIAL PAROTECTOMY W/ FACIAL NERVE DISSECTION AND EXCISION LEFT PAROTID MASS   SPACE OAR INSTILLATION N/A 07/08/2022   Procedure: SPACE OAR INSTILLATION;  Surgeon: Carolee Sherwood JONETTA DOUGLAS, MD;  Location[facility];  Service: Urology;  Laterality: N/A;   SURGERY OF LIP  06/16/2011   @[facility] ;   LEFT UPPER LIP LABIOPLASTY   SURGERY OF LIP  01/11/2016   @[facility] ;   LEFT UPPER LIP STATIC FACIAL SUSPENSION   TRANSURETHRAL RESECTION OF BLADDER TUMOR WITH GYRUS (TURBT-GYRUS)  10/16/2008   @[facility]     SOCIAL HISTORY: Social History   Socioeconomic History   Marital status: Married    Spouse name: Not on file   Number of children: Not on file   Years of education: Not on file   Highest education level: Associate degree: occupational, scientist, product/process development, or vocational program  Occupational History   Not on file  Tobacco Use   Smoking status: Every Day    Current packs/day: 0.50    Average packs/day: 0.5 packs/day for 57.9 years (28.9 ttl pk-yrs)    Types: Cigarettes    Start date: 1968    Passive exposure: Current   Smokeless tobacco: Never   Tobacco comments:    smokes 5-6 cigarettes a day Updated 08/03/2023 TJ, CMA  Vaping Use   Vaping status: Never Used  Substance and Sexual Activity   Alcohol  use: Not Currently   Drug use: Never   Sexual activity: Not Currently  Other Topics Concern   Not on file  Social History Narrative   Not on file   Social Drivers of Health   Financial Resource Strain: Low Risk  (02/23/2024)   Overall Financial Resource Strain (CARDIA)    Difficulty of Paying Living Expenses: Not hard at all  Food Insecurity: No Food Insecurity (02/23/2024)   Hunger Vital Sign    Worried About Running Out of Food in the Last Year: Never true    Ran Out of Food in the Last Year: Never true  Transportation Needs: No Transportation Needs (02/23/2024)   PRAPARE - Administrator, Civil Service (Medical): No    Lack of Transportation (Non-Medical): No   Physical Activity: Inactive (02/23/2024)   Exercise Vital Sign    Days of Exercise per Week: 0 days    Minutes of Exercise per Session: Not on file  Stress: No Stress Concern Present (02/23/2024)   [employer] of Occupational Health - Occupational Stress Questionnaire    Feeling of Stress: Only a little  Social Connections: Moderately Isolated (02/23/2024)   Social Connection and Isolation Panel    Frequency of Communication with Friends and Family: Once a week    Frequency of Social Gatherings with Friends and Family: Once a week    Attends Religious Services: 1 to 4 times per year    Active Member of [employer] or Organizations: No    Attends Engineer, Structural: Not on file    Marital Status: Married  Catering Manager Violence: Not At Risk (11/03/2023)   Humiliation, Afraid, Rape, and Kick questionnaire    Fear of Current or Ex-Partner: No    Emotionally Abused: No    Physically Abused: No    Sexually Abused: No    FAMILY HISTORY: Family History  Problem Relation Age of Onset   Heart attack Mother 72   Cancer Mother        Lung   Diabetes Mother    Heart disease Mother    Hypertension Mother  Stroke Father 53   Cancer Father        Lung   Heart disease Father    Brain cancer Brother    Cancer Sister        Melanoma of great Toe   Hyperlipidemia Sister    Cancer Brother    ROS   10 Point review of Systems was done is negative except as noted above.  PHYSICAL EXAMINATION  ECOG PERFORMANCE STATUS: 1 - Symptomatic but completely ambulatory .BP 105/66   Pulse 77   Temp 97.9 F (36.6 C)   Resp 18   Wt 107 lb 4.8 oz (48.7 kg)   SpO2 94%   BMI 15.40 kg/m  NAD GENERAL:alert, in no acute distress and comfortable SKIN: no acute rashes, no significant lesions EYES: conjunctiva are pink and non-injected, sclera anicteric OROPHARYNX: MMM, no exudates, no oropharyngeal erythema or ulceration NECK: supple, no JVD LYMPH:  no palpable lymphadenopathy in the  cervical, axillary or inguinal regions LUNGS: clear to auscultation b/l with normal respiratory effort HEART: regular rate & rhythm ABDOMEN:  normoactive bowel sounds , non tender, not distended. Extremity: no pedal edema PSYCH: alert & oriented x 3 with fluent speech NEURO: no focal motor/sensory deficits   LABORATORY DATA:     Latest Ref Rng & Units 07/20/2024   10:02 AM 06/29/2024    1:40 PM 06/08/2024   12:49 PM  CBC  WBC 4.0 - 10.5 K/uL 8.4  8.4  9.4   Hemoglobin 13.0 - 17.0 g/dL 88.8  89.0  88.9   Hematocrit 39.0 - 52.0 % 33.0  32.5  33.1   Platelets 150 - 400 K/uL 181  191  183       Latest Ref Rng & Units 06/29/2024    1:40 PM 06/08/2024   12:49 PM 05/18/2024   11:43 AM  CMP  Glucose 70 - 99 mg/dL 874  870  96   BUN 8 - 23 mg/dL 23  27  19    Creatinine 0.61 - 1.24 mg/dL 9.06  9.12  9.18   Sodium 135 - 145 mmol/L 138  139  138   Potassium 3.5 - 5.1 mmol/L 4.6  4.1  4.3   Chloride 98 - 111 mmol/L 102  103  102   CO2 22 - 32 mmol/L 31  31  32   Calcium  8.9 - 10.3 mg/dL 9.8  9.4  9.3   Total Protein 6.5 - 8.1 g/dL 6.8  7.0  6.8   Total Bilirubin 0.0 - 1.2 mg/dL 0.3  0.4  0.4   Alkaline Phos 38 - 126 U/L 74  68  63   AST 15 - 41 U/L 20  19  19    ALT 0 - 44 U/L 16  16  14     . RADIOGRAPHIC STUDIES: I have personally reviewed the radiological images as listed and agreed with the findings in the report.  Biopsy done 04/10/2022:     04/18/2024  CLINICAL DATA:  Metastatic squamous cell lung cancer status post chemotherapy and XRT, ongoing immunotherapy * Tracking Code: BO *   EXAM: CT CHEST WITHOUT CONTRAST   TECHNIQUE: Multidetector CT imaging of the chest was performed following the standard protocol without IV contrast.   RADIATION DOSE REDUCTION: This exam was performed according to the departmental dose-optimization program which includes automated exposure control, adjustment of the mA and/or kV according to patient size and/or use of iterative  reconstruction technique.   COMPARISON:  12/09/2023   FINDINGS: Cardiovascular: Right  chest port catheter. Aortic atherosclerosis. Aortic valve calcifications. Normal heart size. Three-vessel coronary artery calcifications. No pericardial effusion.   Mediastinum/Nodes: No enlarged mediastinal, hilar, or axillary lymph nodes. Thyroid  gland, trachea, and esophagus demonstrate no significant findings.   Lungs/Pleura: Unchanged post treatment/post radiation appearance of the left chest, with a large, cavitary lesion of the posterior left upper lobe and superior segment left lower lobe containing a masslike lesion within the cavitation measuring 2.8 x 2.0 cm (series 5, image 33). Unchanged background of severe emphysema. New irregular nodule of the posterior right upper lobe measuring 0.6 cm (series 5, image 35).   Upper Abdomen: No acute abnormality. Nonobstructive right renal calculus.   Musculoskeletal: Unchanged large lytic lesion of the left aspect of the T5 and T6 vertebral bodies as well as the rib heads (series 5, image 33). No acute osseous findings.   IMPRESSION: 1. Unchanged post treatment/post radiation appearance of the left chest, with a large, cavitary lesion of the posterior left upper lobe and superior segment left lower lobe. 2. 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 3. New irregular nodule of the posterior right upper lobe measuring 0.6 cm, nonspecific, most likely infectious or inflammatory although warranting attention on follow-up. 4. Unchanged large lytic lesion of the left aspect of the T5 and T6 vertebral bodies as well as the rib heads underlying left lung mass. 5. Emphysema. 6. Coronary artery disease. 7. Nonobstructive right nephrolithiasis.   Aortic Atherosclerosis (ICD10-I70.0) and Emphysema (ICD10-J43.9).     Electronically Signed   By: Marolyn JONETTA Jaksch M.D.  ASSESSMENT and THERAPY PLAN:   78  y.o. male with:   1. 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 Bone metastases - T5,T6 and T8-previously on Xgeva  on hold status post his extensive dental extractions in October 2022.  3.  Cancer related pain well controlled with on high dose narcotics (fentanyl  patch 50 mcg/h, morphine  15 mg 3 times a day as needed)  4. 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  Status post radiation. Has regularly followed up with ENT Dr. Lynwood Inks  6.  Status post left ear infection: --Recent CT temporal bones which showed concerns for chronic osteomyelitis + osteoradionecrosis.  7.  History of prostatic adenocarcinoma -Biopsy done 04/10/2022 was reviewed in detail as noted above. -Status post  EBRTwith Dr. Patrcia Patient notes his radiation proctitis related symptoms have nearly resolved -following with urology  PLAN: - Discussed lab results on 07/20/2024 in detail with patient: CBC showed WBC of 8.4K, Hemoglobin of 11.1 increased from 10.9, and PLTs of 181K CMP stable  - Continue to follow with Dermatology.for continued evaluation and management of recurrent skin cancers  - We shall continue his maintenance pembrolizumab  immunotherapy at this time. No new notable toxicities from Pembrolizuamb immunotherapy at this time. - No overt evidence of lung cancer progression at this time  - We will plan repeat CT of the chest in 6 weeks   FOLLOW-UP: Per integrated scheduling MD visit every 6 weeks  .The total time spent in the appointment was 30 minutes* .  All of the patient's questions were answered with apparent satisfaction. The patient knows to call  the  clinic with any problems, questions or concerns.   Emaline Saran MD MS AAHIVMS [facility] Hematology/Oncology Physician [facility]  .*Total Encounter Time as defined by the Centers for Medicare and Medicaid Services includes, in addition to the face-to-face time of a patient visit (documented in the note above) non-face-to-face time: obtaining and reviewing outside history, ordering and reviewing medications, tests or procedures, care coordination (communications with other health care professionals or caregivers) and documentation in the medical record.  I,Emily Lagle,acting as a neurosurgeon for Emaline Saran, MD.,have documented all relevant documentation on the behalf of Emaline Saran, MD,as directed by  Emaline Saran, MD while in the presence of Emaline Saran, MD.  .I have reviewed the above documentation for accuracy and completeness, and I agree with the above. .Edon Hoadley Kishore Davy [place] MD

## 2024-07-22 ENCOUNTER — Encounter: Payer: Self-pay | Admitting: Hematology

## 2024-07-23 LAB — AFB CULTURE WITH SMEAR (NOT AT ARMC)
Acid Fast Culture: NEGATIVE
Acid Fast Smear: NEGATIVE

## 2024-07-26 ENCOUNTER — Encounter: Payer: Self-pay | Admitting: Hematology

## 2024-08-08 ENCOUNTER — Other Ambulatory Visit: Payer: Self-pay

## 2024-08-08 DIAGNOSIS — C3492 Malignant neoplasm of unspecified part of left bronchus or lung: Secondary | ICD-10-CM

## 2024-08-09 ENCOUNTER — Encounter: Payer: Self-pay | Admitting: Hematology

## 2024-08-09 MED ORDER — FENTANYL 50 MCG/HR TD PT72: 1.0000 | MEDICATED_PATCH | TRANSDERMAL | 0 refills | Status: DC

## 2024-08-09 MED ORDER — MORPHINE SULFATE 15 MG PO TABS: 15.0000 mg | ORAL_TABLET | Freq: Four times a day (QID) | ORAL | 0 refills | Status: DC | PRN

## 2024-08-10 ENCOUNTER — Encounter: Payer: Self-pay | Admitting: Hematology

## 2024-08-10 ENCOUNTER — Inpatient Hospital Stay: Attending: Hematology

## 2024-08-10 ENCOUNTER — Inpatient Hospital Stay

## 2024-08-10 VITALS — BP 102/56 | HR 81 | Temp 97.9°F | Resp 18

## 2024-08-10 DIAGNOSIS — C3412 Malignant neoplasm of upper lobe, left bronchus or lung: Secondary | ICD-10-CM | POA: Diagnosis present

## 2024-08-10 DIAGNOSIS — C61 Malignant neoplasm of prostate: Secondary | ICD-10-CM | POA: Diagnosis not present

## 2024-08-10 DIAGNOSIS — F17210 Nicotine dependence, cigarettes, uncomplicated: Secondary | ICD-10-CM | POA: Diagnosis not present

## 2024-08-10 DIAGNOSIS — Z5112 Encounter for antineoplastic immunotherapy: Secondary | ICD-10-CM | POA: Insufficient documentation

## 2024-08-10 DIAGNOSIS — Z8551 Personal history of malignant neoplasm of bladder: Secondary | ICD-10-CM | POA: Diagnosis not present

## 2024-08-10 DIAGNOSIS — C7951 Secondary malignant neoplasm of bone: Secondary | ICD-10-CM

## 2024-08-10 DIAGNOSIS — D649 Anemia, unspecified: Secondary | ICD-10-CM | POA: Diagnosis not present

## 2024-08-10 DIAGNOSIS — C3492 Malignant neoplasm of unspecified part of left bronchus or lung: Secondary | ICD-10-CM

## 2024-08-10 DIAGNOSIS — Z7189 Other specified counseling: Secondary | ICD-10-CM

## 2024-08-10 LAB — CMP (CANCER CENTER ONLY)
ALT: 16 U/L (ref 0–44)
AST: 27 U/L (ref 15–41)
Albumin: 3.7 g/dL (ref 3.5–5.0)
Alkaline Phosphatase: 70 U/L (ref 38–126)
Anion gap: 9 (ref 5–15)
BUN: 24 mg/dL — ABNORMAL HIGH (ref 8–23)
CO2: 28 mmol/L (ref 22–32)
Calcium: 9.4 mg/dL (ref 8.9–10.3)
Chloride: 101 mmol/L (ref 98–111)
Creatinine: 0.74 mg/dL (ref 0.61–1.24)
GFR, Estimated: >60 mL/min (ref >60)
Glucose, Bld: 100 mg/dL — ABNORMAL HIGH (ref 70–99)
Potassium: 4.4 mmol/L (ref 3.5–5.1)
Sodium: 137 mmol/L (ref 135–145)
Total Bilirubin: 0.5 mg/dL (ref 0.0–1.2)
Total Protein: 7.1 g/dL (ref 6.5–8.1)

## 2024-08-10 LAB — CBC WITH DIFFERENTIAL (CANCER CENTER ONLY)
Abs Immature Granulocytes: 0.04 K/uL (ref 0.00–0.07)
Basophils Absolute: 0 K/uL (ref 0.0–0.1)
Basophils Relative: 0 %
Eosinophils Absolute: 0 K/uL (ref 0.0–0.5)
Eosinophils Relative: 0 %
HCT: 31.8 % — ABNORMAL LOW (ref 39.0–52.0)
Hemoglobin: 10.7 g/dL — ABNORMAL LOW (ref 13.0–17.0)
Immature Granulocytes: 0 %
Lymphocytes Relative: 5 %
Lymphs Abs: 0.5 K/uL — ABNORMAL LOW (ref 0.7–4.0)
MCH: 30.5 pg (ref 26.0–34.0)
MCHC: 33.6 g/dL (ref 30.0–36.0)
MCV: 90.6 fL (ref 80.0–100.0)
Monocytes Absolute: 0.7 K/uL (ref 0.1–1.0)
Monocytes Relative: 8 %
Neutro Abs: 8.3 K/uL — ABNORMAL HIGH (ref 1.7–7.7)
Neutrophils Relative %: 87 %
Platelet Count: 174 K/uL (ref 150–400)
RBC: 3.51 MIL/uL — ABNORMAL LOW (ref 4.22–5.81)
RDW: 15.1 % (ref 11.5–15.5)
WBC Count: 9.6 K/uL (ref 4.0–10.5)
nRBC: 0 % (ref 0.0–0.2)

## 2024-08-10 MED ORDER — SODIUM CHLORIDE 0.9 % IV SOLN: Freq: Once | INTRAVENOUS | Status: AC

## 2024-08-10 MED ORDER — FAMOTIDINE 20 MG PO TABS
20.0000 mg | ORAL_TABLET | Freq: Once | ORAL | Status: AC
  Administered 2024-08-10: 20 mg via ORAL
  Filled 2024-08-10: qty 1

## 2024-08-10 MED ORDER — DIPHENHYDRAMINE HCL 25 MG PO CAPS
25.0000 mg | ORAL_CAPSULE | Freq: Once | ORAL | Status: AC
  Administered 2024-08-10: 25 mg via ORAL
  Filled 2024-08-10: qty 1

## 2024-08-10 MED ORDER — SODIUM CHLORIDE 0.9 % IV SOLN
200.0000 mg | Freq: Once | INTRAVENOUS | Status: AC
  Administered 2024-08-10: 200 mg via INTRAVENOUS
  Filled 2024-08-10: qty 200

## 2024-08-10 NOTE — Patient Instructions (Signed)
 [facility]   Discharge Instructions: Thank you for choosing [facility] to provide your oncology and hematology care.   If you have a lab appointment with the Cancer Center, please go directly to the Cancer Center and check in at the registration area.   Wear comfortable clothing and clothing appropriate for easy access to any Portacath or PICC line.   We strive to give you quality time with your provider. You may need to reschedule your appointment if you arrive late (15 or more minutes).  Arriving late affects you and other patients whose appointments are after yours.  Also, if you miss three or more appointments without notifying the office, you may be dismissed from the clinic at the provider's discretion.      For prescription refill requests, have your pharmacy contact our office and allow 72 hours for refills to be completed.    Today you received the following chemotherapy and/or immunotherapy agents: Pembrolizumab Rande Lawman)      To help prevent nausea and vomiting after your treatment, we encourage you to take your nausea medication as directed.  BELOW ARE SYMPTOMS THAT SHOULD BE REPORTED IMMEDIATELY: *FEVER GREATER THAN 100.4 F (38 C) OR HIGHER *CHILLS OR SWEATING *NAUSEA AND VOMITING THAT IS NOT CONTROLLED WITH YOUR NAUSEA MEDICATION *UNUSUAL SHORTNESS OF BREATH *UNUSUAL BRUISING OR BLEEDING *URINARY PROBLEMS (pain or burning when urinating, or frequent urination) *BOWEL PROBLEMS (unusual diarrhea, constipation, pain near the anus) TENDERNESS IN MOUTH AND THROAT WITH OR WITHOUT PRESENCE OF ULCERS (sore throat, sores in mouth, or a toothache) UNUSUAL RASH, SWELLING OR PAIN  UNUSUAL VAGINAL DISCHARGE OR ITCHING   Items with * indicate a potential emergency and should be followed up as soon as possible or go to the Emergency Department if any problems should occur.  Please show the CHEMOTHERAPY ALERT CARD  or IMMUNOTHERAPY ALERT CARD at check-in to the Emergency Department and triage nurse.  Should you have questions after your visit or need to cancel or reschedule your appointment, please contact [facility]  Dept: [phone]  and follow the prompts.  Office hours are 8:00 a.m. to 4:30 p.m. Monday - Friday. Please note that voicemails left after 4:00 p.m. may not be returned until the following business day.  We are closed weekends and major holidays. You have access to a nurse at all times for urgent questions. Please call the main number to the clinic Dept: [phone] and follow the prompts.   For any non-urgent questions, you may also contact your provider using MyChart. We now offer e-Visits for anyone 75 and older to request care online for non-urgent symptoms. For details visit [email or web address].   Also download the MyChart app! Go to the app store, search "MyChart", open the app, select , and log in with your MyChart username and password.

## 2024-08-16 ENCOUNTER — Ambulatory Visit (HOSPITAL_COMMUNITY)
Admission: RE | Admit: 2024-08-16 | Discharge: 2024-08-16 | Disposition: A | Source: Ambulatory Visit | Attending: Hematology

## 2024-08-16 DIAGNOSIS — C3492 Malignant neoplasm of unspecified part of left bronchus or lung: Secondary | ICD-10-CM

## 2024-08-29 ENCOUNTER — Ambulatory Visit (HOSPITAL_BASED_OUTPATIENT_CLINIC_OR_DEPARTMENT_OTHER): Admitting: Family Medicine

## 2024-08-30 ENCOUNTER — Inpatient Hospital Stay

## 2024-08-30 ENCOUNTER — Inpatient Hospital Stay: Admitting: Physician Assistant

## 2024-08-30 VITALS — BP 109/63 | HR 92 | Temp 97.2°F | Resp 18 | Wt 107.3 lb

## 2024-08-30 DIAGNOSIS — Z5112 Encounter for antineoplastic immunotherapy: Secondary | ICD-10-CM | POA: Diagnosis not present

## 2024-08-30 DIAGNOSIS — C7951 Secondary malignant neoplasm of bone: Secondary | ICD-10-CM

## 2024-08-30 DIAGNOSIS — Z7189 Other specified counseling: Secondary | ICD-10-CM

## 2024-08-30 DIAGNOSIS — C3492 Malignant neoplasm of unspecified part of left bronchus or lung: Secondary | ICD-10-CM | POA: Diagnosis not present

## 2024-08-30 LAB — CMP (CANCER CENTER ONLY)
ALT: 17 U/L (ref 0–44)
AST: 29 U/L (ref 15–41)
Albumin: 3.8 g/dL (ref 3.5–5.0)
Alkaline Phosphatase: 66 U/L (ref 38–126)
Anion gap: 8 (ref 5–15)
BUN: 17 mg/dL (ref 8–23)
CO2: 29 mmol/L (ref 22–32)
Calcium: 9.4 mg/dL (ref 8.9–10.3)
Chloride: 100 mmol/L (ref 98–111)
Creatinine: 0.86 mg/dL (ref 0.61–1.24)
GFR, Estimated: >60 mL/min
Glucose, Bld: 114 mg/dL — ABNORMAL HIGH (ref 70–99)
Potassium: 4.5 mmol/L (ref 3.5–5.1)
Sodium: 137 mmol/L (ref 135–145)
Total Bilirubin: 0.4 mg/dL (ref 0.0–1.2)
Total Protein: 7 g/dL (ref 6.5–8.1)

## 2024-08-30 LAB — CBC WITH DIFFERENTIAL (CANCER CENTER ONLY)
Abs Immature Granulocytes: 0.04 K/uL (ref 0.00–0.07)
Basophils Absolute: 0 K/uL (ref 0.0–0.1)
Basophils Relative: 0 %
Eosinophils Absolute: 0.1 K/uL (ref 0.0–0.5)
Eosinophils Relative: 1 %
HCT: 33.1 % — ABNORMAL LOW (ref 39.0–52.0)
Hemoglobin: 11 g/dL — ABNORMAL LOW (ref 13.0–17.0)
Immature Granulocytes: 0 %
Lymphocytes Relative: 4 %
Lymphs Abs: 0.5 K/uL — ABNORMAL LOW (ref 0.7–4.0)
MCH: 30.1 pg (ref 26.0–34.0)
MCHC: 33.2 g/dL (ref 30.0–36.0)
MCV: 90.4 fL (ref 80.0–100.0)
Monocytes Absolute: 0.7 K/uL (ref 0.1–1.0)
Monocytes Relative: 6 %
Neutro Abs: 9.3 K/uL — ABNORMAL HIGH (ref 1.7–7.7)
Neutrophils Relative %: 89 %
Platelet Count: 217 K/uL (ref 150–400)
RBC: 3.66 MIL/uL — ABNORMAL LOW (ref 4.22–5.81)
RDW: 15.6 % — ABNORMAL HIGH (ref 11.5–15.5)
WBC Count: 10.6 K/uL — ABNORMAL HIGH (ref 4.0–10.5)
nRBC: 0 % (ref 0.0–0.2)

## 2024-08-30 MED ORDER — SODIUM CHLORIDE 0.9 % IV SOLN: Freq: Once | INTRAVENOUS | Status: AC

## 2024-08-30 MED ORDER — FAMOTIDINE 20 MG PO TABS
20.0000 mg | ORAL_TABLET | Freq: Once | ORAL | Status: AC
  Administered 2024-08-30: 20 mg via ORAL
  Filled 2024-08-30: qty 1

## 2024-08-30 MED ORDER — DIPHENHYDRAMINE HCL 25 MG PO CAPS
25.0000 mg | ORAL_CAPSULE | Freq: Once | ORAL | Status: AC
  Administered 2024-08-30: 25 mg via ORAL
  Filled 2024-08-30: qty 1

## 2024-08-30 MED ORDER — SODIUM CHLORIDE 0.9 % IV SOLN
200.0000 mg | Freq: Once | INTRAVENOUS | Status: AC
  Administered 2024-08-30: 200 mg via INTRAVENOUS
  Filled 2024-08-30: qty 200
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
## 2024-08-30 NOTE — Patient Instructions (Signed)

## 2024-09-05 ENCOUNTER — Other Ambulatory Visit (HOSPITAL_COMMUNITY): Payer: Self-pay | Admitting: Physician Assistant

## 2024-09-05 DIAGNOSIS — C3492 Malignant neoplasm of unspecified part of left bronchus or lung: Secondary | ICD-10-CM

## 2024-09-05 MED ORDER — MORPHINE SULFATE 15 MG PO TABS: 15.0000 mg | ORAL_TABLET | Freq: Four times a day (QID) | ORAL | 0 refills | Status: AC | PRN

## 2024-09-05 MED ORDER — FENTANYL 50 MCG/HR TD PT72: 1.0000 | MEDICATED_PATCH | TRANSDERMAL | 0 refills | Status: DC

## 2024-09-21 ENCOUNTER — Inpatient Hospital Stay: Attending: Hematology

## 2024-09-21 ENCOUNTER — Inpatient Hospital Stay

## 2024-09-21 VITALS — BP 110/64 | HR 72 | Temp 97.9°F | Resp 16 | Wt 104.0 lb

## 2024-09-21 DIAGNOSIS — C3412 Malignant neoplasm of upper lobe, left bronchus or lung: Secondary | ICD-10-CM | POA: Diagnosis present

## 2024-09-21 DIAGNOSIS — Z5112 Encounter for antineoplastic immunotherapy: Secondary | ICD-10-CM | POA: Diagnosis present

## 2024-09-21 DIAGNOSIS — C7951 Secondary malignant neoplasm of bone: Secondary | ICD-10-CM

## 2024-09-21 DIAGNOSIS — Z7189 Other specified counseling: Secondary | ICD-10-CM

## 2024-09-21 DIAGNOSIS — C3492 Malignant neoplasm of unspecified part of left bronchus or lung: Secondary | ICD-10-CM

## 2024-09-21 LAB — CBC WITH DIFFERENTIAL (CANCER CENTER ONLY)
Abs Immature Granulocytes: 0.02 K/uL (ref 0.00–0.07)
Basophils Absolute: 0 K/uL (ref 0.0–0.1)
Basophils Relative: 0 %
Eosinophils Absolute: 0.1 K/uL (ref 0.0–0.5)
Eosinophils Relative: 1 %
HCT: 34.3 % — ABNORMAL LOW (ref 39.0–52.0)
Hemoglobin: 11.3 g/dL — ABNORMAL LOW (ref 13.0–17.0)
Immature Granulocytes: 0 %
Lymphocytes Relative: 4 %
Lymphs Abs: 0.4 K/uL — ABNORMAL LOW (ref 0.7–4.0)
MCH: 30.5 pg (ref 26.0–34.0)
MCHC: 32.9 g/dL (ref 30.0–36.0)
MCV: 92.7 fL (ref 80.0–100.0)
Monocytes Absolute: 0.7 K/uL (ref 0.1–1.0)
Monocytes Relative: 7 %
Neutro Abs: 9 K/uL — ABNORMAL HIGH (ref 1.7–7.7)
Neutrophils Relative %: 88 %
Platelet Count: 170 K/uL (ref 150–400)
RBC: 3.7 MIL/uL — ABNORMAL LOW (ref 4.22–5.81)
RDW: 17.1 % — ABNORMAL HIGH (ref 11.5–15.5)
WBC Count: 10.1 K/uL (ref 4.0–10.5)
nRBC: 0 % (ref 0.0–0.2)

## 2024-09-21 LAB — CMP (CANCER CENTER ONLY)
ALT: 19 U/L (ref 0–44)
AST: 28 U/L (ref 15–41)
Albumin: 3.8 g/dL (ref 3.5–5.0)
Alkaline Phosphatase: 73 U/L (ref 38–126)
Anion gap: 9 (ref 5–15)
BUN: 26 mg/dL — ABNORMAL HIGH (ref 8–23)
CO2: 31 mmol/L (ref 22–32)
Calcium: 9.8 mg/dL (ref 8.9–10.3)
Chloride: 100 mmol/L (ref 98–111)
Creatinine: 0.89 mg/dL (ref 0.61–1.24)
GFR, Estimated: >60 mL/min
Glucose, Bld: 115 mg/dL — ABNORMAL HIGH (ref 70–99)
Potassium: 4.4 mmol/L (ref 3.5–5.1)
Sodium: 140 mmol/L (ref 135–145)
Total Bilirubin: 0.3 mg/dL (ref 0.0–1.2)
Total Protein: 7.1 g/dL (ref 6.5–8.1)

## 2024-09-21 MED ORDER — DIPHENHYDRAMINE HCL 25 MG PO CAPS
25.0000 mg | ORAL_CAPSULE | Freq: Once | ORAL | Status: AC
  Administered 2024-09-21: 25 mg via ORAL
  Filled 2024-09-21: qty 1

## 2024-09-21 MED ORDER — FAMOTIDINE 20 MG PO TABS
20.0000 mg | ORAL_TABLET | Freq: Once | ORAL | Status: AC
  Administered 2024-09-21: 20 mg via ORAL
  Filled 2024-09-21: qty 1

## 2024-09-21 MED ORDER — SODIUM CHLORIDE 0.9 % IV SOLN: Freq: Once | INTRAVENOUS | Status: AC

## 2024-09-21 MED ORDER — SODIUM CHLORIDE 0.9 % IV SOLN
200.0000 mg | Freq: Once | INTRAVENOUS | Status: AC
  Administered 2024-09-21: 200 mg via INTRAVENOUS
  Filled 2024-09-21: qty 200

## 2024-09-21 NOTE — Patient Instructions (Signed)
 [facility] - A DEPT OF [facility]  Discharge Instructions: Thank you for choosing [facility] to provide your oncology and hematology care.   If you have a lab appointment with the Cancer Center, please go directly to the Cancer Center and check in at the registration area.   Wear comfortable clothing and clothing appropriate for easy access to any Portacath or PICC line.   We strive to give you quality time with your provider. You may need to reschedule your appointment if you arrive late (15 or more minutes).  Arriving late affects you and other patients whose appointments are after yours.  Also, if you miss three or more appointments without notifying the office, you may be dismissed from the clinic at the provider's discretion.      For prescription refill requests, have your pharmacy contact our office and allow 72 hours for refills to be completed.    Today you received the following chemotherapy and/or immunotherapy agents: pembrolizumab [facility])      To help prevent nausea and vomiting after your treatment, we encourage you to take your nausea medication as directed.  BELOW ARE SYMPTOMS THAT SHOULD BE REPORTED IMMEDIATELY: *FEVER GREATER THAN 100.4 F (38 C) OR HIGHER *CHILLS OR SWEATING *NAUSEA AND VOMITING THAT IS NOT CONTROLLED WITH YOUR NAUSEA MEDICATION *UNUSUAL SHORTNESS OF BREATH *UNUSUAL BRUISING OR BLEEDING *URINARY PROBLEMS (pain or burning when urinating, or frequent urination) *BOWEL PROBLEMS (unusual diarrhea, constipation, pain near the anus) TENDERNESS IN MOUTH AND THROAT WITH OR WITHOUT PRESENCE OF ULCERS (sore throat, sores in mouth, or a toothache) UNUSUAL RASH, SWELLING OR PAIN  UNUSUAL VAGINAL DISCHARGE OR ITCHING   Items with * indicate a potential emergency and should be followed up as soon as possible or go to the Emergency Department if any problems should occur.  Please show the CHEMOTHERAPY ALERT CARD or  IMMUNOTHERAPY ALERT CARD at check-in to the Emergency Department and triage nurse.  Should you have questions after your visit or need to cancel or reschedule your appointment, please contact [facility] - A DEPT OF [facility]  Dept: [phone]  and follow the prompts.  Office hours are 8:00 a.m. to 4:30 p.m. Monday - Friday. Please note that voicemails left after 4:00 p.m. may not be returned until the following business day.  We are closed weekends and major holidays. You have access to a nurse at all times for urgent questions. Please call the main number to the clinic Dept: [phone] and follow the prompts.   For any non-urgent questions, you may also contact your provider using MyChart. We now offer e-Visits for anyone 31 and older to request care online for non-urgent symptoms. For details visit [email or web address].   Also download the MyChart app! Go to the app store, search "MyChart", open the app, select [facility], and log in with your MyChart username and password.

## 2024-09-27 ENCOUNTER — Other Ambulatory Visit: Payer: Self-pay

## 2024-09-28 ENCOUNTER — Encounter (HOSPITAL_BASED_OUTPATIENT_CLINIC_OR_DEPARTMENT_OTHER): Payer: Self-pay | Admitting: Family Medicine

## 2024-09-28 ENCOUNTER — Ambulatory Visit (HOSPITAL_BASED_OUTPATIENT_CLINIC_OR_DEPARTMENT_OTHER): Admitting: Family Medicine

## 2024-09-28 VITALS — BP 109/62 | HR 92 | Resp 18 | Ht 70.0 in | Wt 106.0 lb

## 2024-09-28 DIAGNOSIS — Z23 Encounter for immunization: Secondary | ICD-10-CM | POA: Diagnosis not present

## 2024-09-28 DIAGNOSIS — J439 Emphysema, unspecified: Secondary | ICD-10-CM | POA: Diagnosis not present

## 2024-09-28 DIAGNOSIS — C7951 Secondary malignant neoplasm of bone: Secondary | ICD-10-CM

## 2024-09-28 DIAGNOSIS — J4489 Other specified chronic obstructive pulmonary disease: Secondary | ICD-10-CM | POA: Diagnosis not present

## 2024-09-28 DIAGNOSIS — L608 Other nail disorders: Secondary | ICD-10-CM | POA: Insufficient documentation

## 2024-09-28 NOTE — Assessment & Plan Note (Signed)
 Patient notes that he has had some discoloration in great toenail bilaterally.  He also had a portion of toenail fall off recently.  Denies significant thickening.  No associated pain or other symptoms. On exam, he does have some slight discoloration of both great toenails bilaterally.  Very mild thickening compared to rest of the nails.  Discoloration is slightly yellow in appearance. We discussed considerations and he would like to proceed with referral to podiatry, referral placed today.

## 2024-09-28 NOTE — Progress Notes (Signed)
" ° ° °  Procedures performed today:    None.  Independent interpretation of notes and tests performed by another provider:   None.  Brief History, Exam, Impression, and Recommendations:    BP 109/62 (BP Location: Left Arm, Patient Position: Sitting, Cuff Size: Normal)   Pulse 92   Resp 18   Ht 5' 10 (1.778 m)   Wt 106 lb (48.1 kg)   SpO2 97%   BMI 15.21 kg/m   Nail discoloration Assessment & Plan: Patient notes that he has had some discoloration in great toenail bilaterally.  He also had a portion of toenail fall off recently.  Denies significant thickening.  No associated pain or other symptoms. On exam, he does have some slight discoloration of both great toenails bilaterally.  Very mild thickening compared to rest of the nails.  Discoloration is slightly yellow in appearance. We discussed considerations and he would like to proceed with referral to podiatry, referral placed today.  Orders: -     Ambulatory referral to Podiatry  COPD with chronic bronchitis and emphysema (HCC) Assessment & Plan: Patient continues to note ongoing shortness of breath - currently felt to be at baseline for chronic shortness of breath noted with underlying COPD.  He continues with inhalers as prescribed by pulmonology including Breztri , albuterol  as needed.  He also continues with Flonase  and nasal saline spray to help with sinus congestion. Recommend monitoring symptoms moving forward and can return to office sooner should any worsening be noted.   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  He does follow with oncology for ongoing treatment.  Recommend continuing follow-up as scheduled with specialist.   Immunization due -     Tdap vaccine greater than or equal to 7yo IM  Return in about 6 months (around 03/28/2025).   ___________________________________________ Careli Luzader de [place], MD, ABFM,  CAQ[facility] "

## 2024-09-28 NOTE — Assessment & Plan Note (Signed)
 Patient continues to note ongoing shortness of breath - currently felt to be at baseline for chronic shortness of breath noted with underlying COPD.  He continues with inhalers as prescribed by pulmonology including Breztri , albuterol  as needed.  He also continues with Flonase  and nasal saline spray to help with sinus congestion. Recommend monitoring symptoms moving forward and can return to office sooner should any worsening be noted.

## 2024-09-28 NOTE — Assessment & Plan Note (Signed)
 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  He does follow with oncology for ongoing treatment.  Recommend continuing follow-up as scheduled with specialist.

## 2024-09-29 ENCOUNTER — Other Ambulatory Visit: Payer: Self-pay

## 2024-09-29 ENCOUNTER — Other Ambulatory Visit: Payer: Self-pay | Admitting: Hematology

## 2024-09-29 ENCOUNTER — Encounter: Payer: Self-pay | Admitting: Hematology

## 2024-09-29 DIAGNOSIS — C3492 Malignant neoplasm of unspecified part of left bronchus or lung: Secondary | ICD-10-CM

## 2024-10-11 ENCOUNTER — Other Ambulatory Visit: Payer: Self-pay

## 2024-10-11 ENCOUNTER — Ambulatory Visit

## 2024-10-11 DIAGNOSIS — C7951 Secondary malignant neoplasm of bone: Secondary | ICD-10-CM

## 2024-10-11 DIAGNOSIS — C3492 Malignant neoplasm of unspecified part of left bronchus or lung: Secondary | ICD-10-CM

## 2024-10-11 DIAGNOSIS — B351 Tinea unguium: Secondary | ICD-10-CM | POA: Diagnosis not present

## 2024-10-12 ENCOUNTER — Inpatient Hospital Stay: Attending: Hematology | Admitting: Hematology

## 2024-10-12 ENCOUNTER — Encounter: Payer: Self-pay | Admitting: Hematology

## 2024-10-12 ENCOUNTER — Inpatient Hospital Stay

## 2024-10-12 ENCOUNTER — Inpatient Hospital Stay: Attending: Hematology

## 2024-10-12 VITALS — BP 113/56 | HR 89 | Temp 97.9°F | Resp 18 | Wt 109.4 lb

## 2024-10-12 VITALS — BP 115/70 | HR 80

## 2024-10-12 DIAGNOSIS — Z7189 Other specified counseling: Secondary | ICD-10-CM

## 2024-10-12 DIAGNOSIS — C7951 Secondary malignant neoplasm of bone: Secondary | ICD-10-CM

## 2024-10-12 DIAGNOSIS — C3492 Malignant neoplasm of unspecified part of left bronchus or lung: Secondary | ICD-10-CM

## 2024-10-12 LAB — CMP (CANCER CENTER ONLY)
ALT: 16 U/L (ref 0–44)
AST: 25 U/L (ref 15–41)
Albumin: 3.8 g/dL (ref 3.5–5.0)
Alkaline Phosphatase: 82 U/L (ref 38–126)
Anion gap: 8 (ref 5–15)
BUN: 24 mg/dL — ABNORMAL HIGH (ref 8–23)
CO2: 28 mmol/L (ref 22–32)
Calcium: 9.4 mg/dL (ref 8.9–10.3)
Chloride: 102 mmol/L (ref 98–111)
Creatinine: 0.83 mg/dL (ref 0.61–1.24)
GFR, Estimated: >60 mL/min
Glucose, Bld: 109 mg/dL — ABNORMAL HIGH (ref 70–99)
Potassium: 4.4 mmol/L (ref 3.5–5.1)
Sodium: 138 mmol/L (ref 135–145)
Total Bilirubin: 0.3 mg/dL (ref 0.0–1.2)
Total Protein: 7.1 g/dL (ref 6.5–8.1)

## 2024-10-12 LAB — CBC WITH DIFFERENTIAL (CANCER CENTER ONLY)
Abs Immature Granulocytes: 0.03 10*3/uL (ref 0.00–0.07)
Basophils Absolute: 0 10*3/uL (ref 0.0–0.1)
Basophils Relative: 0 %
Eosinophils Absolute: 0.2 10*3/uL (ref 0.0–0.5)
Eosinophils Relative: 2 %
HCT: 32.8 % — ABNORMAL LOW (ref 39.0–52.0)
Hemoglobin: 10.8 g/dL — ABNORMAL LOW (ref 13.0–17.0)
Immature Granulocytes: 0 %
Lymphocytes Relative: 6 %
Lymphs Abs: 0.4 10*3/uL — ABNORMAL LOW (ref 0.7–4.0)
MCH: 30.9 pg (ref 26.0–34.0)
MCHC: 32.9 g/dL (ref 30.0–36.0)
MCV: 93.7 fL (ref 80.0–100.0)
Monocytes Absolute: 0.7 10*3/uL (ref 0.1–1.0)
Monocytes Relative: 8 %
Neutro Abs: 6.7 10*3/uL (ref 1.7–7.7)
Neutrophils Relative %: 84 %
Platelet Count: 182 10*3/uL (ref 150–400)
RBC: 3.5 MIL/uL — ABNORMAL LOW (ref 4.22–5.81)
RDW: 15.9 % — ABNORMAL HIGH (ref 11.5–15.5)
WBC Count: 8.1 10*3/uL (ref 4.0–10.5)
nRBC: 0 % (ref 0.0–0.2)

## 2024-10-12 LAB — TSH: TSH: 2.41 u[IU]/mL (ref 0.350–4.500)

## 2024-10-12 MED ORDER — SODIUM CHLORIDE 0.9 % IV SOLN: Freq: Once | INTRAVENOUS | Status: AC

## 2024-10-12 MED ORDER — MORPHINE SULFATE 15 MG PO TABS: 15.0000 mg | ORAL_TABLET | Freq: Four times a day (QID) | ORAL | 0 refills | Status: AC | PRN

## 2024-10-12 MED ORDER — DIPHENHYDRAMINE HCL 25 MG PO CAPS
25.0000 mg | ORAL_CAPSULE | Freq: Once | ORAL | Status: AC
  Administered 2024-10-12: 25 mg via ORAL
  Filled 2024-10-12: qty 1

## 2024-10-12 MED ORDER — FENTANYL 50 MCG/HR TD PT72: 1.0000 | MEDICATED_PATCH | TRANSDERMAL | 0 refills | Status: AC

## 2024-10-12 MED ORDER — FAMOTIDINE 20 MG PO TABS
20.0000 mg | ORAL_TABLET | Freq: Once | ORAL | Status: AC
  Administered 2024-10-12: 20 mg via ORAL
  Filled 2024-10-12: qty 1

## 2024-10-12 MED ORDER — SODIUM CHLORIDE 0.9 % IV SOLN
200.0000 mg | Freq: Once | INTRAVENOUS | Status: AC
  Administered 2024-10-12: 200 mg via INTRAVENOUS
  Filled 2024-10-12: qty 200

## 2024-10-12 NOTE — Progress Notes (Shared)
 " HEMATOLOGY ONCOLOGY PROGRESS NOTE  Date of service: 10/12/2024  Patient Care Team: de [place], Quintin PARAS, MD as PCP - General (Family Medicine) Court Dorn PARAS, MD as PCP - Cardiology (Cardiology) Onesimo Emaline Brink, MD as Consulting Physician (Hematology) Nieves Cough, MD as Consulting Physician (Urology) Vertell Pont, RN as Oncology Nurse Navigator Kara, Dorn NOVAK, MD as Consulting Physician (Pulmonary Disease)  CHIEF COMPLAINT/PURPOSE OF CONSULTATION: Follow-up for continued evaluation and management of stage IV squamous cell lung cancer and newly diagnosed prostatic adenocarcinoma.   HISTORY OF PRESENTING ILLNESS: (11/09/2018) PLEAS CARNEAL is a wonderful 79 y.o. male who has been referred to us  by Dr. Brigida Bureau for evaluation and management of Lung Mass concerning for primary lung cancer.    he pt reports he has been having back pain on and off for about 3 months and was being worked up at the [facility] in [place] by his PCP . He reports it was being maanged as MSK pain and he was referred to PT but the symptoms got progressively worse. He presented to the ED on 10/14/18 with SOB and midsternal chest pain, neck pain, and back pain which had been constant for the last 2-3 weeks.    Of note prior to the patient's visit today, pt has had a CTA Chest completed on 10/14/18 with results revealing 5.6 cm left upper lobe lesion with chest wall and thoracic spine invasion, possible epidural extension of tumor. 2. 2.7 cm left hilar mass occluding the left lower lobe pulmonary artery branch and probably attenuating or occluding the inferior left pulmonary vein. 3. Left lower lobe pulmonary nodules possibly metastatic. 4. Negative for acute PE or thoracic aortic dissection. 5. Coronary and Aortic Atherosclerosis.   Most recent lab results (10/15/18) of CBC is as follows: all values are WNL except for RBC at 3.76, [name], [name], Glucose at 111.   He subsequently had an MRI of the T spine  which showed Large cavitary mass arising in the superior aspect of the left hilum and extending into the posteromedial aspect of the left upper lobe invading the left side of the T5 and T6 vertebra and extending into the spinal canal with a slight mass effect upon the spinal cord. There is a slight pathologic compression fracture of the T6 vertebral body. 2. Probable small metastasis in the T8 vertebral body. 3. Metastatic pulmonary nodules at the left lung base posterolaterally. 4. The mass destroys the posterior aspects of the left fifth and sixth ribs.   Patient has had a h/o localized bladder cancer s/p TURBT in 2009. 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    GIOVONNI POIRIER returns today for management and evaluation of his Squamous Cell Carcinoma Lung Cancer. I last saw the pt on 10/18/18 as an inpatient. He is accompanied today by his wife. The pt reports that he is doing well overall.    The pt reports that his back pain has improved after recent completing RT of 30Gy over 10 fractions. He notes that he is taking long acting morphine  every 8 hours. Short acting BID for break through pain. With this regimen, the pt notes that his pain control is fine. He notes that most of his pain is localized to his upper left back. He denies the pain radiating into his legs.   The pt notes that his breathing is fine, and began feeling more SOB than his baseline,  three weeks prior to his recent hospital admission. He endorses thick secretions, occasionally with streaks of blood. he notes that he coughs more when he breathes through his mouth.   The pt notes that she has had recent constipation. He endorses a low appetite and has been losing weight, and notes that he is not eating well. He notes that he is eating at least half as much as he used to. He is mostly eating soups, which feels good on his throat.    The pt notes that he is not  planning on having any dental extractions and denies any dental pains.   The pt has had a history of skin squamous cells.    Most recent lab results (10/22/18) of CBC w/diff and BMP is as follows: all values are WNL except for RBC at 4.10, HGB at 12.9, Eosinophils abs at 800, BUN at 27, Calcium  at 10.4.   On review of systems, pt reports stable breathing, controlled left upper back pain, stable energy levels, some coughing with occasional streaks of blood, constipation, weakened appetite, weight loss, and denies leg pain, and any other symptoms.   SUMMARY OF ONCOLOGIC HISTORY: Oncology History  Metastatic cancer (HCC)  10/19/2018 Initial Diagnosis   Metastatic cancer (HCC)   11/24/2018 - 06/04/2022 Chemotherapy   Patient is on Treatment Plan : LUNG NSCLC Carboplatin  + Paclitaxel  + Pembrolizumab  q21d x 4 cycles / Pembrolizumab  Maintenance Q21D     07/16/2022 -  Chemotherapy   Patient is on Treatment Plan : LUNG NSCLC Pembrolizumab  (200) q21d     Malignant neoplasm metastatic to bone (HCC)  10/19/2018 Initial Diagnosis   Bone metastases (HCC)   11/24/2018 - 06/04/2022 Chemotherapy   The patient had dexamethasone  (DECADRON ) 4 MG tablet, 1 of 1 cycle, Start date: 11/24/2018, End date: 04/27/2019 palonosetron  (ALOXI ) injection 0.25 mg, 0.25 mg, Intravenous,  Once, 5 of 5 cycles Administration: 0.25 mg (11/24/2018), 0.25 mg (12/15/2018), 0.25 mg (01/05/2019), 0.25 mg (01/26/2019), 0.25 mg (02/16/2019) pegfilgrastim  (NEULASTA  ONPRO KIT) injection 6 mg, 6 mg, Subcutaneous, Once, 3 of 3 cycles Administration: 6 mg (01/05/2019), 6 mg (01/26/2019), 6 mg (02/16/2019) pegfilgrastim -cbqv (UDENYCA ) injection 6 mg, 6 mg, Subcutaneous, Once, 2 of 2 cycles Administration: 6 mg (11/26/2018), 6 mg (12/17/2018) CARBOplatin  (PARAPLATIN ) 410 mg in sodium chloride  0.9 % 250 mL chemo infusion, 410 mg (108 % of original dose 381.5 mg), Intravenous,  Once, 5 of 5 cycles Dose modification:   (original dose 381.5 mg, Cycle  1) Administration: 410 mg (11/24/2018), 410 mg (12/15/2018), 410 mg (01/05/2019), 410 mg (01/26/2019), 410 mg (02/16/2019) PACLitaxel  (TAXOL ) 234 mg in sodium chloride  0.9 % 250 mL chemo infusion (> 80mg /m2), 135 mg/m2 = 234 mg (100 % of original dose 135 mg/m2), Intravenous,  Once, 5 of 5 cycles Dose modification: 150 mg/m2 (original dose 135 mg/m2, Cycle 1, Reason: Provider Judgment), 135 mg/m2 (original dose 135 mg/m2, Cycle 1, Reason: Provider Judgment), 150 mg/m2 (original dose 135 mg/m2, Cycle 2, Reason: Catheter Related Infection) Administration: 234 mg (11/24/2018), 258 mg (12/15/2018), 258 mg (01/05/2019), 258 mg (01/26/2019), 258 mg (02/16/2019) pembrolizumab  (KEYTRUDA ) 200 mg in sodium chloride  0.9 % 50 mL chemo infusion, 200 mg, Intravenous, Once, 27 of 29 cycles Administration: 200 mg (11/24/2018), 200 mg (12/15/2018), 200 mg (01/05/2019), 200 mg (01/26/2019), 200 mg (02/16/2019), 200 mg (03/09/2019), 200 mg (03/30/2019), 200 mg (04/20/2019), 200 mg (05/11/2019), 200 mg (06/01/2019), 200 mg (06/22/2019), 200 mg (07/13/2019), 200 mg (08/03/2019), 200 mg (09/14/2019), 200 mg (08/24/2019), 200 mg (10/05/2019), 200 mg (10/26/2019),  200 mg (11/16/2019), 200 mg (12/07/2019), 200 mg (12/28/2019), 200 mg (01/18/2020), 200 mg (02/08/2020), 200 mg (02/29/2020), 200 mg (03/21/2020), 200 mg (04/11/2020), 200 mg (05/02/2020), 200 mg (05/24/2020) fosaprepitant  (EMEND ) 150 mg, dexamethasone  (DECADRON ) 12 mg in sodium chloride  0.9 % 145 mL IVPB, , Intravenous,  Once, 5 of 5 cycles Administration:  (11/24/2018),  (12/15/2018),  (01/05/2019),  (01/26/2019),  (02/16/2019)  for chemotherapy treatment.    07/16/2022 -  Chemotherapy   Patient is on Treatment Plan : LUNG NSCLC Pembrolizumab  (200) q21d     Squamous cell lung cancer, left (HCC)  11/24/2018 Initial Diagnosis   Squamous cell lung cancer, left (HCC)   11/24/2018 - 06/04/2022 Chemotherapy   The patient had dexamethasone  (DECADRON ) 4 MG tablet, 1 of 1 cycle, Start date: 11/24/2018, End date:  04/27/2019 palonosetron  (ALOXI ) injection 0.25 mg, 0.25 mg, Intravenous,  Once, 5 of 5 cycles Administration: 0.25 mg (11/24/2018), 0.25 mg (12/15/2018), 0.25 mg (01/05/2019), 0.25 mg (01/26/2019), 0.25 mg (02/16/2019) pegfilgrastim  (NEULASTA  ONPRO KIT) injection 6 mg, 6 mg, Subcutaneous, Once, 3 of 3 cycles Administration: 6 mg (01/05/2019), 6 mg (01/26/2019), 6 mg (02/16/2019) pegfilgrastim -cbqv (UDENYCA ) injection 6 mg, 6 mg, Subcutaneous, Once, 2 of 2 cycles Administration: 6 mg (11/26/2018), 6 mg (12/17/2018) CARBOplatin  (PARAPLATIN ) 410 mg in sodium chloride  0.9 % 250 mL chemo infusion, 410 mg (108 % of original dose 381.5 mg), Intravenous,  Once, 5 of 5 cycles Dose modification:   (original dose 381.5 mg, Cycle 1) Administration: 410 mg (11/24/2018), 410 mg (12/15/2018), 410 mg (01/05/2019), 410 mg (01/26/2019), 410 mg (02/16/2019) PACLitaxel  (TAXOL ) 234 mg in sodium chloride  0.9 % 250 mL chemo infusion (> 80mg /m2), 135 mg/m2 = 234 mg (100 % of original dose 135 mg/m2), Intravenous,  Once, 5 of 5 cycles Dose modification: 150 mg/m2 (original dose 135 mg/m2, Cycle 1, Reason: Provider Judgment), 135 mg/m2 (original dose 135 mg/m2, Cycle 1, Reason: Provider Judgment), 150 mg/m2 (original dose 135 mg/m2, Cycle 2, Reason: Catheter Related Infection) Administration: 234 mg (11/24/2018), 258 mg (12/15/2018), 258 mg (01/05/2019), 258 mg (01/26/2019), 258 mg (02/16/2019) pembrolizumab  (KEYTRUDA ) 200 mg in sodium chloride  0.9 % 50 mL chemo infusion, 200 mg, Intravenous, Once, 27 of 29 cycles Administration: 200 mg (11/24/2018), 200 mg (12/15/2018), 200 mg (01/05/2019), 200 mg (01/26/2019), 200 mg (02/16/2019), 200 mg (03/09/2019), 200 mg (03/30/2019), 200 mg (04/20/2019), 200 mg (05/11/2019), 200 mg (06/01/2019), 200 mg (06/22/2019), 200 mg (07/13/2019), 200 mg (08/03/2019), 200 mg (09/14/2019), 200 mg (08/24/2019), 200 mg (10/05/2019), 200 mg (10/26/2019), 200 mg (11/16/2019), 200 mg (12/07/2019), 200 mg (12/28/2019), 200 mg (01/18/2020), 200 mg  (02/08/2020), 200 mg (02/29/2020), 200 mg (03/21/2020), 200 mg (04/11/2020), 200 mg (05/02/2020), 200 mg (05/24/2020) fosaprepitant  (EMEND ) 150 mg, dexamethasone  (DECADRON ) 12 mg in sodium chloride  0.9 % 145 mL IVPB, , Intravenous,  Once, 5 of 5 cycles Administration:  (11/24/2018),  (12/15/2018),  (01/05/2019),  (01/26/2019),  (02/16/2019)  for chemotherapy treatment.    07/16/2022 -  Chemotherapy   Patient is on Treatment Plan : LUNG NSCLC Pembrolizumab  (200) q21d     Squamous cell carcinoma of lung, stage IV (HCC)  02/19/2020 Initial Diagnosis   Squamous cell carcinoma of lung, stage IV (HCC)   07/29/2023 Cancer Staging   Staging form: Lung, AJCC 8th Edition - Clinical: Stage IVB (pM1c) - Signed by Onesimo Emaline Brink, MD on 07/29/2023   Malignant neoplasm of prostate (HCC)  04/10/2022 Cancer Staging   Staging form: Prostate, AJCC 8th Edition - Clinical stage from 04/10/2022: Stage IIC (cT2b, cN0, cM0, PSA:  5.4, Grade Group: 3) - Signed by Sherwood Rise, PA-C on 05/15/2022 Histopathologic type: Adenocarcinoma, NOS Stage prefix: Initial diagnosis Prostate specific antigen (PSA) range: Less than 10 Gleason primary pattern: 4 Gleason secondary pattern: 3 Gleason score: 7 Histologic grading system: 5 grade system Number of biopsy cores examined: 12 Number of biopsy cores positive: 6 Location of positive needle core biopsies: Both sides   05/15/2022 Initial Diagnosis   Malignant neoplasm of prostate (HCC)     INTERVAL HISTORY: CLARIS GUYMON is a 79 y.o. male who is here today for continued evaluation and management of stage IV squamous cell lung cancer and newly diagnosed prostatic adenocarcinoma. . {ELcompanions/ambulations (Optional):[id number]}  he was last seen by me on 07/20/2024; at the time he did not have any concerns and was doing well.   Today, he notes that he has been cramping in his left hand at night. He notes that the cramping comes and goes. He states that the cramping has been occurring  for a few months now.   He is still taking a magnesium  supplement.   Denies neck pain, back pain, diarrhea, weight changes, and leg swelling.   REVIEW OF SYSTEMS:   10 Point review of systems of done and is negative except as noted above.  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    GERD (gastroesophageal reflux disease)    History of cancer chemotherapy    History of external beam radiation therapy    completed radiation for parotid cancer 2011;   and had radiation for lung cancer completed 02/ 2020   History of kidney stones    History of MI (myocardial infarction) 06/2008   History of primary bladder cancer 10/2008   s/ p   TURBT,  TCC   History of skin cancer    cut & burned off arms, hands, face, neck   Hyperlipidemia    Hypertension    Left ear pain 07/08/2021   Left lower lobe pneumonia 02/20/2020   Maintenance antineoplastic immunotherapy    Malignant neoplasm metastatic to bone [facility])    Malignant neoplasm prostate (HCC)    Myocardial infarction (HCC) 06/2008   Osteoradionecrosis of temporal bone (HCC)    followed by ID   Persistent cough for 3 weeks or longer 07/08/2021   Pulmonary infarct (HCC) 02/19/2020   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 immunotherapy;   mets to bone,  stage IV,  completed radiation 11-03-2018,  chemotherapy ongoing since 11-24-2018    SURGICAL HISTORY Past Surgical History:  Procedure Laterality Date   CATARACT EXTRACTION W/ INTRAOCULAR LENS IMPLANT Right 12/2007   CORONARY  ANGIOPLASTY WITH STENT PLACEMENT  07/03/2008   @[facility]  by dr berry;   BMS x3 to AV groove LCFx and marginal branch biforcation ,  normal LVF,  RCA 70%   CYSTOSCOPY W/ RETROGRADES  09/22/2011   Procedure: CYSTOSCOPY WITH RETROGRADE PYELOGRAM;  Surgeon: Alm GORMAN Fragmin, MD;  Location: [facility];  Service: Urology;  Laterality: Left;  Cystoscopy left Retrograde Pyelogram      (c-arm)    CYSTOSCOPY WITH BIOPSY  09/22/2011   Procedure: CYSTOSCOPY WITH BIOPSY;  Surgeon: Alm GORMAN Fragmin, MD;  Location: [facility];  Service: Urology;  Laterality: N/A;   Biopsy   CYSTOSCOPY WITH BIOPSY N/A 04/19/2021   Procedure: CYSTOSCOPY WITH BLADDER  BIOPSY WITH FULGERATION;  Surgeon: Nieves Cough, MD;  Location: [facility];  Service: Urology;  Laterality: N/A;   EXCISIONAL HEMORRHOIDECTOMY  03/15/2002   @[facility]    EYE SURGERY  04/2011   FOOT NEUROMA SURGERY Left 2005   GOLD SEED IMPLANT N/A 07/08/2022   Procedure: GOLD SEED IMPLANT;  Surgeon: Carolee Sherwood JONETTA DOUGLAS, MD;  Location: [facility];  Service: Urology;  Laterality: N/A;  30 MINS FOR CASE   INGUINAL HERNIA REPAIR Right 02/2018   IR IMAGING GUIDED PORT INSERTION  11/23/2018   PAROTIDECTOMY W/ NECK DISSECTION TOTAL  12/18/2009   @[facility]  by dr graig;   TOTAL LEFT PAROTIDECTOMY/   LEFT MASTOIDECTOMY/    LEFT SELECTIVE NECK DISSECTIONS/     STERNOCLEIDOMASTOID FLAP RECONSTRUCTION/  GOLD WEIGT IMPANT LEFT UPPER EYELID   SALIVARY GLAND SURGERY Left 11/02/2009   @[facility]  by dr ethyl;   LEFT SUPERFICIAL PAROTECTOMY W/ FACIAL NERVE DISSECTION AND EXCISION LEFT PAROTID MASS   SPACE OAR INSTILLATION N/A 07/08/2022   Procedure: SPACE OAR INSTILLATION;  Surgeon: Carolee Sherwood JONETTA DOUGLAS, MD;  Location: [facility];  Service: Urology;  Laterality: N/A;   SURGERY OF LIP  06/16/2011   @[facility] ;   LEFT UPPER LIP LABIOPLASTY   SURGERY OF LIP  01/11/2016   @[facility] ;   LEFT UPPER LIP STATIC FACIAL SUSPENSION   TRANSURETHRAL RESECTION OF BLADDER TUMOR WITH GYRUS  (TURBT-GYRUS)  10/16/2008   @[facility]     SOCIAL HISTORY Social History[1]  Social History   Social History Narrative   Not on file    SOCIAL DRIVERS OF HEALTH SDOH Screenings   Food Insecurity: No Food Insecurity (09/25/2024)  Housing: Low Risk (09/25/2024)  Transportation Needs: No Transportation Needs (09/25/2024)  Utilities: Not At Risk (11/03/2023)  Alcohol  Screen: Low Risk (11/03/2023)  Depression (PHQ2-9): Low Risk (09/21/2024)  Financial Resource Strain: Low Risk (09/25/2024)  Physical Activity: Inactive (09/25/2024)  Social Connections: Moderately Integrated (09/25/2024)  Stress: No Stress Concern Present (09/25/2024)  Tobacco Use: High Risk (10/11/2024)  Health Literacy: Adequate Health Literacy (11/03/2023)     FAMILY HISTORY Family History  Problem Relation Age of Onset   Heart attack Mother 63   Cancer Mother        Lung   Diabetes Mother    Heart disease Mother    Hypertension Mother    Stroke Father 67   Cancer Father        Lung   Heart disease Father    Brain cancer Brother    Cancer Sister        Melanoma of great Toe   Hyperlipidemia Sister    Cancer Brother      ALLERGIES: is allergic to codeine.  MEDICATIONS  Current Outpatient Medications  Medication Sig Dispense Refill   acetaminophen  (TYLENOL ) 500 MG tablet Take 1,000 mg by mouth every 8 (eight) hours as needed for moderate pain.     albuterol  (VENTOLIN  HFA) 108 (90 Base) MCG/ACT inhaler INHALE 1 PUFF INTO THE LUNGS EVERY 6 HOURS AS NEEDED FOR WHEEZING OR SHORTNESS OF BREATH. 18 each 1   atorvastatin  (LIPITOR ) 80 MG tablet TAKE 1 TABLET BY MOUTH EVERY DAY 90 tablet  3   B Complex-C (B-COMPLEX WITH VITAMIN C) tablet Take 1 tablet by mouth daily.     BREZTRI  AEROSPHERE 160-9-4.8 MCG/ACT AERO inhaler INHALE 2 PUFFS INTO THE LUNGS IN THE MORNING AND AT BEDTIME. 10.7 each 5   calcium  carbonate (TUMS - DOSED IN MG ELEMENTAL CALCIUM ) 500 MG chewable tablet Chew 1 tablet (200 mg of elemental calcium  total)  by mouth 3 (three) times daily with meals. (Patient taking differently: Chew 1 tablet by mouth 3 (three) times daily as needed for indigestion.) 30 tablet 3   Cholecalciferol  (VITAMIN D ) 2000 UNITS tablet Take 2,000 Units by mouth daily.     clopidogrel  (PLAVIX ) 75 MG tablet TAKE 1 TABLET BY MOUTH EVERY DAY 90 tablet 3   dronabinol  (MARINOL ) 2.5 MG capsule Take 1 capsule (2.5 mg total) by mouth 2 (two) times daily before a meal. 60 capsule 1   DULoxetine  (CYMBALTA ) 30 MG capsule TAKE 2 CAPSULES BY MOUTH EVERY DAY 180 capsule 2   fentaNYL  (DURAGESIC ) 50 MCG/HR Place 1 patch onto the skin every 3 (three) days. 10 patch 0   fluticasone  (FLONASE ) 50 MCG/ACT nasal spray SPRAY 1 SPRAY INTO BOTH NOSTRILS DAILY. 48 mL 2   Hypromellose (ARTIFICIAL TEARS OP) Place 1 drop into both eyes as needed.     ipratropium-albuterol  (DUONEB) 0.5-2.5 (3) MG/3ML SOLN Take 3 mLs by nebulization 4 (four) times daily as needed. 360 mL 3   Lactobacillus (PROBIOTIC GOLD EXTRA STRENGTH) CAPS Take 1 capsule by mouth daily.     lidocaine -prilocaine  (EMLA ) cream Apply 1 Application topically as needed (access port). 30 g 3   loratadine  (CLARITIN ) 10 MG tablet Take 10 mg by mouth daily as needed for allergies.     magnesium  oxide (MAG-OX) 400 MG tablet Take 1 tablet (400 mg total) by mouth daily. 90 tablet 0   metoprolol  tartrate (LOPRESSOR ) 25 MG tablet Take 0.5 tablets (12.5 mg total) by mouth daily. 45 tablet 3   morphine  (MSIR) 15 MG tablet Take 15 mg by mouth every 6 (six) hours as needed.     ondansetron  (ZOFRAN ) 8 MG tablet TAKE 1 TABLET BY MOUTH 3 TIMES A DAY AS NEEDED 90 tablet 2   pantoprazole  (PROTONIX ) 40 MG tablet TAKE 1 TABLET BY MOUTH EVERY DAY 90 tablet 3   Polyvinyl Alcohol  (TEARS AGAIN OP) Place 1 drop into the left eye nightly.     tamsulosin  (FLOMAX ) 0.4 MG CAPS capsule Take 1 capsule (0.4 mg total) by mouth daily after supper. 30 capsule 5   No current facility-administered medications for this visit.     PHYSICAL EXAMINATION: ECOG PERFORMANCE STATUS: 0 - Asymptomatic VITALS: Vitals:   10/12/24 1124  BP: (!) 113/56  Pulse: 89  Resp: 18  Temp: 97.9 F (36.6 C)  SpO2: 96%   Filed Weights   10/12/24 1124  Weight: 109 lb 6.4 oz (49.6 kg)   Body mass index is 15.7 kg/m.  GENERAL: alert, in no acute distress and comfortable SKIN: no acute rashes, no significant lesions EYES: conjunctiva are pink and non-injected, sclera anicteric OROPHARYNX: MMM, no exudates, no oropharyngeal erythema or ulceration NECK: supple, no JVD LYMPH:  no palpable lymphadenopathy in the cervical, axillary or inguinal regions LUNGS: clear to auscultation b/l with normal respiratory effort HEART: regular rate & rhythm ABDOMEN:  normoactive bowel sounds , non tender, not distended, no hepatosplenomegaly Extremity: no pedal edema PSYCH: alert & oriented x 3 with fluent speech NEURO: no focal motor/sensory deficits  LABORATORY DATA:   I  have reviewed the data as listed     Latest Ref Rng & Units 10/12/2024   10:50 AM 09/21/2024   12:40 PM 08/30/2024    8:16 AM  CBC EXTENDED  WBC 4.0 - 10.5 K/uL 8.1  10.1  10.6   RBC 4.22 - 5.81 MIL/uL 3.50  3.70  3.66   Hemoglobin 13.0 - 17.0 g/dL 89.1  88.6  88.9   HCT 39.0 - 52.0 % 32.8  34.3  33.1   Platelets 150 - 400 K/uL 182  170  217   NEUT# 1.7 - 7.7 K/uL 6.7  9.0  9.3   Lymph# 0.7 - 4.0 K/uL 0.4  0.4  0.5         Latest Ref Rng & Units 10/12/2024   10:50 AM 09/21/2024   12:40 PM 08/30/2024    8:16 AM  CMP  Glucose 70 - 99 mg/dL 890  884  885   BUN 8 - 23 mg/dL 24  26  17    Creatinine 0.61 - 1.24 mg/dL 9.16  9.10  9.13   Sodium 135 - 145 mmol/L 138  140  137   Potassium 3.5 - 5.1 mmol/L 4.4  4.4  4.5   Chloride 98 - 111 mmol/L 102  100  100   CO2 22 - 32 mmol/L 28  31  29    Calcium  8.9 - 10.3 mg/dL 9.4  9.8  9.4   Total Protein 6.5 - 8.1 g/dL 7.1  7.1  7.0   Total Bilirubin 0.0 - 1.2 mg/dL 0.3  0.3  0.4   Alkaline Phos 38 - 126 U/L 82  73  66    AST 15 - 41 U/L 25  28  29    ALT 0 - 44 U/L 16  19  17       RADIOGRAPHIC STUDIES: I have personally reviewed the radiological images as listed and agreed with the findings in the report. CT Chest Wo Contrast Result Date: 08/22/2024 EXAM: CT CHEST WITHOUT CONTRAST 08/16/2024 01:55:00 PM TECHNIQUE: CT of the chest was performed without the administration of intravenous contrast. Multiplanar reformatted images are provided for review. Automated exposure control, iterative reconstruction, and/or weight based adjustment of the mA/kV was utilized to reduce the radiation dose to as low as reasonably achievable. COMPARISON: Chest CTs all without contrast 04/18/2024, 12/29/2023, and 08/27/2023. CLINICAL HISTORY: Non-small cell lung cancer (NSCLC), recurrence; Interval evaluation for lung squamous cell carcinoma. FINDINGS: MEDIASTINUM: Right chest port and IJ approach catheter positioning are stable. The cardiac size is normal. The aorta and coronary arteries are heavily calcified. There are chronic calcifications and thickening of the aortic valve leaflets, scattered calcification in the great vessels. The pulmonary arteries and veins are nondilated. No pericardial effusion. The central airways are clear. LYMPH NODES: There is a slightly prominent precarinal lymph node, 1.2 cm short axis. No other thoracic adenopathy is seen without contrast. No axillary lymphadenopathy. LUNGS AND PLEURA: Severe panlobular emphysema. Volume loss of the left chest with mediastinal shift. A thick-walled cavity again is noted in the posterior left upper lobe and superior segment of the adjacent left lower lobe and again contains a lobulated mass which measures 2.7 x 2.0 cm on series 6 axial images 42 and 43. The solid component is not appreciably changed over the last 2 studies but was smaller on 08/27/2023. There previously was a new 6 mm irregular nodule posteriorly in the right upper lobe which has cleared, and presumably was  inflammatory. There is a stable irregular nodule  in the base of the right middle lobe, measuring 1.3 x 1.1 cm on axial 132. Just above this, there is a coarse interstitial infiltrate in the right middle lobe on several slices, probably infectious etiology as well as similar coarsely reticulated new opacities interspersed with small branch bronchial impactions noted in the posterior basal right lower lobe on multiple slices. Correlate clinically for recent aspiration. There is biapical pleuroparenchymal scarring and additional chronic coarse scarlike markings in the left upper lobe anteriorly and in the left lower lobe. No pleural effusion. No pneumothorax. SOFT TISSUES/BONES: In the thoracic spine, evidence of direct tumor invasion is again seen with a large lytic lesion over portions of the T5 and T6 vertebral bodies to the left, with lytic changes in the transverse processes and left 5th and 6th posterior ribs. No new or progressive metastatic bone involvement is seen. There is osteopenia with kyphosis and degenerative change of the spine, chronically noted. There is a general paucity of body fat as before, which could be due to cachexia. No acute abnormality of the soft tissues. UPPER ABDOMEN: There is a Bosniak 2 cyst in the superior pole of the left kidney and a 5 mm nonobstructive stone in the superior pole of the right kidney. Per consensus, no follow-up is needed for simple Bosniak type 1 and 2 renal cysts, unless the patient has a malignancy history or risk factors. Limited images of the upper abdomen demonstrates no acute abnormality. IMPRESSION: 1. Stable thick-walled cavity in the posterior left upper lobe and superior segment of the adjacent left lower lobe containing a lobulated mass measuring 2.7 x 2.0 cm, unchanged over the last 2 studies but increased compared to 08/27/2023. 2. New coarse interstitial infiltrate in the right middle lobe and new coarsely reticulated opacities in the posterior basal  right lower lobe, likely infectious and possibly aspiration-related. 3. Stable irregular nodule in the right middle lobe base measuring 1.3 x 1.1 cm. 4. Large lytic lesion involving T5-T6 vertebral bodies, adjacent transverse processes, and left 5th and 6th posterior ribs consistent with direct tumor invasion without new or progressive osseous metastatic disease. 5. Severe panlobular emphysema with left hemithorax volume loss and mediastinal shift. Electronically signed by: Francis Quam MD 08/22/2024 01:15 AM EST RP Workstation: [id number]    Biopsy done 04/10/2022:       04/18/2024  CLINICAL DATA:  Metastatic squamous cell lung cancer status post chemotherapy and XRT, ongoing immunotherapy * Tracking Code: BO *   EXAM: CT CHEST WITHOUT CONTRAST   TECHNIQUE: Multidetector CT imaging of the chest was performed following the standard protocol without IV contrast.   RADIATION DOSE REDUCTION: This exam was performed according to the departmental dose-optimization program which includes automated exposure control, adjustment of the mA and/or kV according to patient size and/or use of iterative reconstruction technique.   COMPARISON:  12/09/2023   FINDINGS: Cardiovascular: Right chest port catheter. Aortic atherosclerosis. Aortic valve calcifications. Normal heart size. Three-vessel coronary artery calcifications. No pericardial effusion.   Mediastinum/Nodes: No enlarged mediastinal, hilar, or axillary lymph nodes. Thyroid  gland, trachea, and esophagus demonstrate no significant findings.   Lungs/Pleura: Unchanged post treatment/post radiation appearance of the left chest, with a large, cavitary lesion of the posterior left upper lobe and superior segment left lower lobe containing a masslike lesion within the cavitation measuring 2.8 x 2.0 cm (series 5, image 33). Unchanged background of severe emphysema. New irregular nodule of the posterior right upper lobe measuring 0.6  cm (series 5, image 35).   Upper  Abdomen: No acute abnormality. Nonobstructive right renal calculus.   Musculoskeletal: Unchanged large lytic lesion of the left aspect of the T5 and T6 vertebral bodies as well as the rib heads (series 5, image 33). No acute osseous findings.   IMPRESSION: 1. Unchanged post treatment/post radiation appearance of the left chest, with a large, cavitary lesion of the posterior left upper lobe and superior segment left lower lobe. 2. 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 3. New irregular nodule of the posterior right upper lobe measuring 0.6 cm, nonspecific, most likely infectious or inflammatory although warranting attention on follow-up. 4. Unchanged large lytic lesion of the left aspect of the T5 and T6 vertebral bodies as well as the rib heads underlying left lung mass. 5. Emphysema. 6. Coronary artery disease. 7. Nonobstructive right nephrolithiasis.   Aortic Atherosclerosis (ICD10-I70.0) and Emphysema (ICD10-J43.9).     Electronically Signed   By: Marolyn JONETTA Jaksch M.D.  ASSESSMENT & PLAN:  79 y.o. male with  1. 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 Bone metastases - T5,T6 and T8-previously on Xgeva  on hold status post his extensive dental extractions in October 2022.   3.  Cancer related pain well controlled with on high dose narcotics (fentanyl  patch 50 mcg/h, morphine  15 mg 3 times a day as needed)   4. 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  Status post radiation. Has regularly followed up with ENT Dr. Lynwood Inks   6.  Status post left ear  infection: --Recent CT temporal bones which showed concerns for chronic osteomyelitis + osteoradionecrosis.   7.  History of prostatic adenocarcinoma -Biopsy done 04/10/2022 was reviewed in detail as noted above. -Status post  EBRTwith Dr. Patrcia Patient notes his radiation proctitis related symptoms have nearly resolved -following with urology    PLAN: - Discussed lab results on 10/12/2024 in detail with patient: - CMP   - Creatinine:  0.83 - Calcium : 9.4 - CBC  - Hemoglobin: 10.8  - WBC: 8.1  - Platelets: 182 - TSH pending - T4 pending - CT chest without contrast in 4 to 5 weeks - Continue pembrolizumab  every 3 weeks with port flush and labs as per integrated scheduling - MD visit in 6 weeks  FOLLOW-UP in 6 weeks for labs and follow-up with Dr. Onesimo.  The total time spent in the appointment was *** minutes* .  All of the patient's questions were answered and the patient knows to call the clinic with any problems, questions, or concerns.  Emaline Onesimo MD MS AAHIVMS [facility] Hematology/Oncology Physician [facility]  *Total Encounter Time as defined by the Centers for Medicare and Medicaid Services includes, in addition to the face-to-face time of a patient visit (documented in the note above) non-face-to-face time: obtaining and reviewing outside history, ordering and reviewing medications, tests or procedures, care coordination (communications with other health care professionals or caregivers) and documentation in the medical record.  I, Marijo Sharps, acting as a neurosurgeon for Emaline Onesimo, MD.,have documented all relevant documentation on the behalf of Emaline Onesimo, MD,as directed by  Emaline Onesimo, MD while in the presence of Emaline Onesimo, MD.  I have reviewed the above documentation for accuracy and completeness, and I agree with the above.  Emaline Saran, MD      [1]  Social History Tobacco Use   Smoking status: Every Day    Current packs/day: 0.50    Average  packs/day: 0.5 packs/day for 58.1 years (29.0 ttl pk-yrs)    Types: Cigarettes    Start date: 1968    Passive exposure: Current   Smokeless tobacco: Never   Tobacco comments:    smokes 5-6 cigarettes a day Updated 08/03/2023 TJ, CMA  Vaping Use   Vaping status: Never Used  Substance Use Topics   Alcohol  use: Not Currently   Drug use: Never   "

## 2024-10-12 NOTE — Patient Instructions (Signed)
 [facility] - A DEPT OF [facility]  Discharge Instructions: Thank you for choosing [facility] to provide your oncology and hematology care.   If you have a lab appointment with the Cancer Center, please go directly to the Cancer Center and check in at the registration area.   Wear comfortable clothing and clothing appropriate for easy access to any Portacath or PICC line.   We strive to give you quality time with your provider. You may need to reschedule your appointment if you arrive late (15 or more minutes).  Arriving late affects you and other patients whose appointments are after yours.  Also, if you miss three or more appointments without notifying the office, you may be dismissed from the clinic at the provider's discretion.      For prescription refill requests, have your pharmacy contact our office and allow 72 hours for refills to be completed.    Today you received the following chemotherapy and/or immunotherapy agents: pembrolizumab [facility])      To help prevent nausea and vomiting after your treatment, we encourage you to take your nausea medication as directed.  BELOW ARE SYMPTOMS THAT SHOULD BE REPORTED IMMEDIATELY: *FEVER GREATER THAN 100.4 F (38 C) OR HIGHER *CHILLS OR SWEATING *NAUSEA AND VOMITING THAT IS NOT CONTROLLED WITH YOUR NAUSEA MEDICATION *UNUSUAL SHORTNESS OF BREATH *UNUSUAL BRUISING OR BLEEDING *URINARY PROBLEMS (pain or burning when urinating, or frequent urination) *BOWEL PROBLEMS (unusual diarrhea, constipation, pain near the anus) TENDERNESS IN MOUTH AND THROAT WITH OR WITHOUT PRESENCE OF ULCERS (sore throat, sores in mouth, or a toothache) UNUSUAL RASH, SWELLING OR PAIN  UNUSUAL VAGINAL DISCHARGE OR ITCHING   Items with * indicate a potential emergency and should be followed up as soon as possible or go to the Emergency Department if any problems should occur.  Please show the CHEMOTHERAPY ALERT CARD or  IMMUNOTHERAPY ALERT CARD at check-in to the Emergency Department and triage nurse.  Should you have questions after your visit or need to cancel or reschedule your appointment, please contact [facility] - A DEPT OF [facility]  Dept: [phone]  and follow the prompts.  Office hours are 8:00 a.m. to 4:30 p.m. Monday - Friday. Please note that voicemails left after 4:00 p.m. may not be returned until the following business day.  We are closed weekends and major holidays. You have access to a nurse at all times for urgent questions. Please call the main number to the clinic Dept: [phone] and follow the prompts.   For any non-urgent questions, you may also contact your provider using MyChart. We now offer e-Visits for anyone 31 and older to request care online for non-urgent symptoms. For details visit [email or web address].   Also download the MyChart app! Go to the app store, search "MyChart", open the app, select [facility], and log in with your MyChart username and password.

## 2024-10-12 NOTE — Progress Notes (Signed)
 "  Subjective:  Patient ID: Kyle Foster, male    DOB: 30-Oct-1945,  MRN: [id number]  Chief Complaint  Patient presents with   Nail Problem    Rm12 Patient complains of yellow nail discoloration and big toes nails fall off/ no pain/otc antifungal nail creams.    Discussed the use of AI scribe software for clinical note transcription with the patient, who gave verbal consent to proceed.  History of Present Illness Kyle Foster is a 79 year old male with stage IV lung cancer who presents for evaluation of bilateral great toe onychomycosis.  He has longstanding yellow, thickened great toenails bilaterally. Over-the-counter topical antifungals helped initially but are now ineffective. He is unhappy with the nail appearance and wants definitive treatment. He denies burning, tingling, or other sensory changes in the feet.  He receives immunotherapy for lung cancer every three weeks and denies liver disease.  He had a nerve excision on his foot about 25 years ago. No other foot procedures are reported.     Review of Systems: Negative except as noted in the HPI. Denies N/V/F/Ch.  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    GERD (gastroesophageal reflux disease)    History of cancer chemotherapy    History of external beam radiation therapy    completed radiation for parotid cancer 2011;   and had radiation for lung cancer completed 02/ 2020   History of kidney stones    History of MI (myocardial infarction) 06/2008   History of primary bladder cancer 10/2008   s/ p    TURBT,  TCC   History of skin cancer    cut & burned off arms, hands, face, neck   Hyperlipidemia    Hypertension    Left ear pain 07/08/2021   Left lower lobe pneumonia 02/20/2020   Maintenance antineoplastic immunotherapy    Malignant neoplasm metastatic to bone [facility])    Malignant neoplasm prostate (HCC)    Myocardial infarction (HCC) 06/2008   Osteoradionecrosis of temporal bone (HCC)    followed by ID   Persistent cough for 3 weeks or longer 07/08/2021   Pulmonary infarct (HCC) 02/19/2020   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 immunotherapy;   mets to bone,  stage IV,  completed radiation 11-03-2018,  chemotherapy ongoing since 11-24-2018   Current Medications[1]  Tobacco Use History[2]  Allergies[3] Objective:   Constitutional Well developed. Well nourished. Oriented to person, place, and time.  Vascular Dorsalis pedis pulses palpable bilaterally. Posterior tibial pulses palpable bilaterally. Capillary refill normal to all digits.  No cyanosis or clubbing noted. Pedal hair growth normal.  Neurologic Normal speech. Epicritic sensation to light touch grossly intact bilaterally. Negative tinel sign at tarsal tunnel bilaterally.   Dermatologic Skin texture and turgor are within normal limits.  No open wounds. No skin lesions. Bilateral hallux nails are thickened, dystrophic, crumbly and texture.  No surrounding erythema, edema or sign of infection.  No ingrowing of nails.  Right worse than  left.  Musculoskeletal: 5 out of 5 muscle strength all major pedal muscle groups, no contributing deformity      Assessment:   1. Onychomycosis      Plan:  Patient was evaluated and treated and all questions answered.  Assessment and Plan Assessment & Plan Onychomycosis Chronic onychomycosis of hallux toenails, antifungals avoided due to immunotherapy for lung cancer. - Recommended nail debridement as an option. - Discussed laser therapy, three  sessions, one month apart, - Discussed the use of terbinafine, we will avoid it at this time due to his concurrent treatment for cancer - Advised over-the-counter topicals unlikely effective. - Patient will return to clinic for laser nail therapy      No follow-ups on file.   Prentice Ovens, DPM AACFAS Fellowship Trained Podiatric Surgeon [facility]     [1]  Current Outpatient Medications:    acetaminophen  (TYLENOL ) 500 MG tablet, Take 1,000 mg by mouth every 8 (eight) hours as needed for moderate pain., Disp: , Rfl:    albuterol  (VENTOLIN  HFA) 108 (90 Base) MCG/ACT inhaler, INHALE 1 PUFF INTO THE LUNGS EVERY 6 HOURS AS NEEDED FOR WHEEZING OR SHORTNESS OF BREATH., Disp: 18 each, Rfl: 1   atorvastatin  (LIPITOR ) 80 MG tablet, TAKE 1 TABLET BY MOUTH EVERY DAY, Disp: 90 tablet, Rfl: 3   B Complex-C (B-COMPLEX WITH VITAMIN C) tablet, Take 1 tablet by mouth daily., Disp: , Rfl:    BREZTRI  AEROSPHERE 160-9-4.8 MCG/ACT AERO inhaler, INHALE 2 PUFFS INTO THE LUNGS IN THE MORNING AND AT BEDTIME., Disp: 10.7 each, Rfl: 5   calcium  carbonate (TUMS - DOSED IN MG ELEMENTAL CALCIUM ) 500 MG chewable tablet, Chew 1 tablet (200 mg of elemental calcium  total) by mouth 3 (three) times daily with meals. (Patient taking differently: Chew 1 tablet by mouth 3 (three) times daily as needed for indigestion.), Disp: 30 tablet, Rfl: 3   Cholecalciferol  (VITAMIN D ) 2000 UNITS tablet, Take 2,000 Units by mouth daily., Disp: , Rfl:    clopidogrel  (PLAVIX ) 75 MG tablet, TAKE 1 TABLET BY MOUTH EVERY DAY, Disp: 90 tablet, Rfl: 3   dronabinol  (MARINOL ) 2.5 MG capsule, Take 1 capsule (2.5 mg total) by mouth 2 (two) times daily before a meal., Disp: 60 capsule, Rfl: 1   DULoxetine  (CYMBALTA ) 30 MG capsule, TAKE 2 CAPSULES BY MOUTH EVERY DAY, Disp: 180 capsule, Rfl: 2   fentaNYL  (DURAGESIC ) 50 MCG/HR, Place 1 patch onto the skin every 3 (three) days., Disp: 10 patch, Rfl: 0   fluticasone  (FLONASE ) 50 MCG/ACT  nasal spray, SPRAY 1 SPRAY INTO BOTH NOSTRILS DAILY., Disp: 48 mL, Rfl: 2   Hypromellose (ARTIFICIAL TEARS OP), Place 1 drop into both eyes as needed., Disp: , Rfl:    ipratropium-albuterol  (DUONEB) 0.5-2.5 (3) MG/3ML SOLN, Take 3 mLs by nebulization 4 (four) times daily as needed., Disp: 360 mL, Rfl: 3   Lactobacillus (PROBIOTIC GOLD EXTRA STRENGTH) CAPS, Take 1 capsule by mouth daily., Disp: , Rfl:    lidocaine -prilocaine  (EMLA ) cream, Apply 1 Application topically as needed (access port)., Disp: 30 g, Rfl: 3   loratadine  (CLARITIN ) 10 MG tablet, Take 10 mg by mouth daily as needed for allergies., Disp: , Rfl:    magnesium  oxide (MAG-OX) 400 MG tablet, Take 1 tablet (400 mg total) by mouth daily., Disp: 90 tablet, Rfl: 0   metoprolol  tartrate (LOPRESSOR ) 25 MG tablet, Take 0.5 tablets (12.5 mg total) by mouth daily., Disp: 45 tablet, Rfl: 3   ondansetron  (ZOFRAN ) 8 MG tablet, TAKE  1 TABLET BY MOUTH 3 TIMES A DAY AS NEEDED, Disp: 90 tablet, Rfl: 2   pantoprazole  (PROTONIX ) 40 MG tablet, TAKE 1 TABLET BY MOUTH EVERY DAY, Disp: 90 tablet, Rfl: 3   Polyvinyl Alcohol  (TEARS AGAIN OP), Place 1 drop into the left eye nightly., Disp: , Rfl:    tamsulosin  (FLOMAX ) 0.4 MG CAPS capsule, Take 1 capsule (0.4 mg total) by mouth daily after supper., Disp: 30 capsule, Rfl: 5   morphine  (MSIR) 15 MG tablet, Take 15 mg by mouth every 6 (six) hours as needed., Disp: , Rfl:  [2]  Social History Tobacco Use  Smoking Status Every Day   Current packs/day: 0.50   Average packs/day: 0.5 packs/day for 58.1 years (29.0 ttl pk-yrs)   Types: Cigarettes   Start date: 1968   Passive exposure: Current  Smokeless Tobacco Never  Tobacco Comments   smokes 5-6 cigarettes a day Updated 08/03/2023 TJ, CMA  [3]  Allergies Allergen Reactions   Codeine Other (See Comments)    HEADACHE   "

## 2024-10-13 LAB — T4: T4, Total: 8.3 ug/dL (ref 4.5–12.0)

## 2024-10-25 ENCOUNTER — Ambulatory Visit

## 2024-11-02 ENCOUNTER — Inpatient Hospital Stay

## 2024-11-21 ENCOUNTER — Ambulatory Visit: Admitting: Pulmonary Disease

## 2024-11-22 ENCOUNTER — Ambulatory Visit

## 2024-11-23 ENCOUNTER — Inpatient Hospital Stay: Admitting: Hematology

## 2024-11-23 ENCOUNTER — Inpatient Hospital Stay

## 2024-11-23 ENCOUNTER — Inpatient Hospital Stay: Attending: Hematology

## 2024-12-06 ENCOUNTER — Encounter (HOSPITAL_BASED_OUTPATIENT_CLINIC_OR_DEPARTMENT_OTHER): Payer: Medicare Other

## 2024-12-26 ENCOUNTER — Ambulatory Visit: Admitting: Cardiovascular Disease

## 2024-12-27 ENCOUNTER — Ambulatory Visit

## 2025-03-28 ENCOUNTER — Ambulatory Visit (HOSPITAL_BASED_OUTPATIENT_CLINIC_OR_DEPARTMENT_OTHER): Admitting: Family Medicine
